# Patient Record
Sex: Female | Born: 1935
Health system: Southern US, Community
[De-identification: ages and names within clinical notes are randomized; demographics above are authoritative.]

## PROBLEM LIST (undated history)

## (undated) DIAGNOSIS — C801 Malignant (primary) neoplasm, unspecified: Secondary | ICD-10-CM

## (undated) DIAGNOSIS — D689 Coagulation defect, unspecified: Secondary | ICD-10-CM

## (undated) DIAGNOSIS — M8580 Other specified disorders of bone density and structure, unspecified site: Secondary | ICD-10-CM

## (undated) DIAGNOSIS — E039 Hypothyroidism, unspecified: Secondary | ICD-10-CM

## (undated) DIAGNOSIS — IMO0002 Reserved for concepts with insufficient information to code with codable children: Secondary | ICD-10-CM

## (undated) DIAGNOSIS — M1990 Unspecified osteoarthritis, unspecified site: Secondary | ICD-10-CM

## (undated) DIAGNOSIS — E669 Obesity, unspecified: Secondary | ICD-10-CM

## (undated) DIAGNOSIS — I10 Essential (primary) hypertension: Secondary | ICD-10-CM

## (undated) DIAGNOSIS — Z7901 Long term (current) use of anticoagulants: Secondary | ICD-10-CM

## (undated) HISTORY — DX: Long term (current) use of anticoagulants: Z79.01

## (undated) HISTORY — DX: Other specified disorders of bone density and structure, unspecified site: M85.80

## (undated) HISTORY — DX: Reserved for concepts with insufficient information to code with codable children: IMO0002

## (undated) HISTORY — PX: CHOLECYSTECTOMY: SHX55

## (undated) HISTORY — DX: Essential (primary) hypertension: I10

## (undated) HISTORY — DX: Obesity, unspecified: E66.9

## (undated) HISTORY — DX: Unspecified osteoarthritis, unspecified site: M19.90

## (undated) HISTORY — DX: Coagulation defect, unspecified: D68.9

## (undated) HISTORY — DX: Hypothyroidism, unspecified: E03.9

## (undated) HISTORY — PX: VESICOVAGINAL FISTULA CLOSURE W/ TAH: SUR271

---

## 1978-08-06 HISTORY — PX: ABDOMINAL HYSTERECTOMY: SHX81

## 2002-02-17 ENCOUNTER — Ambulatory Visit (HOSPITAL_COMMUNITY): Admission: RE | Admit: 2002-02-17 | Discharge: 2002-02-17 | Payer: Self-pay | Admitting: Family Medicine

## 2002-02-17 ENCOUNTER — Encounter: Payer: Self-pay | Admitting: Family Medicine

## 2002-05-06 ENCOUNTER — Ambulatory Visit (HOSPITAL_COMMUNITY): Admission: RE | Admit: 2002-05-06 | Discharge: 2002-05-06 | Payer: Self-pay | Admitting: General Surgery

## 2003-06-24 ENCOUNTER — Ambulatory Visit (HOSPITAL_COMMUNITY): Admission: RE | Admit: 2003-06-24 | Discharge: 2003-06-24 | Payer: Self-pay | Admitting: Family Medicine

## 2003-10-18 ENCOUNTER — Ambulatory Visit (HOSPITAL_COMMUNITY): Admission: RE | Admit: 2003-10-18 | Discharge: 2003-10-18 | Payer: Self-pay | Admitting: Family Medicine

## 2004-10-18 ENCOUNTER — Ambulatory Visit: Payer: Self-pay | Admitting: Family Medicine

## 2005-06-14 ENCOUNTER — Ambulatory Visit: Payer: Self-pay | Admitting: Family Medicine

## 2005-07-16 ENCOUNTER — Ambulatory Visit (HOSPITAL_COMMUNITY): Admission: RE | Admit: 2005-07-16 | Discharge: 2005-07-16 | Payer: Self-pay | Admitting: Family Medicine

## 2005-07-19 ENCOUNTER — Ambulatory Visit: Payer: Self-pay | Admitting: Family Medicine

## 2005-09-24 ENCOUNTER — Ambulatory Visit: Payer: Self-pay | Admitting: Family Medicine

## 2006-02-28 ENCOUNTER — Ambulatory Visit: Payer: Self-pay | Admitting: Family Medicine

## 2006-04-10 ENCOUNTER — Ambulatory Visit: Payer: Self-pay | Admitting: Family Medicine

## 2006-07-10 ENCOUNTER — Ambulatory Visit: Payer: Self-pay | Admitting: Family Medicine

## 2006-07-11 ENCOUNTER — Ambulatory Visit (HOSPITAL_COMMUNITY): Admission: RE | Admit: 2006-07-11 | Discharge: 2006-07-11 | Payer: Self-pay | Admitting: Family Medicine

## 2006-08-22 ENCOUNTER — Ambulatory Visit (HOSPITAL_COMMUNITY): Admission: RE | Admit: 2006-08-22 | Discharge: 2006-08-22 | Payer: Self-pay | Admitting: Family Medicine

## 2006-09-30 ENCOUNTER — Ambulatory Visit: Payer: Self-pay | Admitting: Family Medicine

## 2006-10-07 ENCOUNTER — Ambulatory Visit: Payer: Self-pay | Admitting: Family Medicine

## 2006-12-06 ENCOUNTER — Encounter: Payer: Self-pay | Admitting: Family Medicine

## 2006-12-06 LAB — CONVERTED CEMR LAB
ALT: 12 units/L (ref 0–35)
AST: 15 units/L (ref 0–37)
Albumin: 3.7 g/dL (ref 3.5–5.2)
Alkaline Phosphatase: 47 units/L (ref 39–117)
BUN: 12 mg/dL (ref 6–23)
Bilirubin, Direct: 0.1 mg/dL (ref 0.0–0.3)
CO2: 29 meq/L (ref 19–32)
Calcium: 8.7 mg/dL (ref 8.4–10.5)
Chloride: 106 meq/L (ref 96–112)
Cholesterol: 192 mg/dL (ref 0–200)
Creatinine, Ser: 0.8 mg/dL (ref 0.40–1.20)
Glucose, Bld: 80 mg/dL (ref 70–99)
HDL: 49 mg/dL (ref >39)
Indirect Bilirubin: 0.5 mg/dL (ref 0.0–0.9)
LDL Cholesterol: 119 mg/dL — ABNORMAL HIGH (ref 0–99)
Potassium: 3.9 meq/L (ref 3.5–5.3)
Sodium: 144 meq/L (ref 135–145)
Total Bilirubin: 0.6 mg/dL (ref 0.3–1.2)
Total CHOL/HDL Ratio: 3.9
Total Protein: 6.6 g/dL (ref 6.0–8.3)
Triglycerides: 122 mg/dL (ref <150)
VLDL: 24 mg/dL (ref 0–40)

## 2006-12-09 ENCOUNTER — Other Ambulatory Visit: Admission: RE | Admit: 2006-12-09 | Discharge: 2006-12-09 | Payer: Self-pay | Admitting: Family Medicine

## 2006-12-09 ENCOUNTER — Ambulatory Visit: Payer: Self-pay | Admitting: Family Medicine

## 2006-12-09 ENCOUNTER — Encounter: Payer: Self-pay | Admitting: Family Medicine

## 2006-12-09 LAB — CONVERTED CEMR LAB
Pap Smear: NORMAL
Pap Smear: NORMAL
TSH: 3.462 microintl units/mL (ref 0.350–5.50)

## 2007-06-19 ENCOUNTER — Ambulatory Visit: Payer: Self-pay | Admitting: Family Medicine

## 2007-06-26 ENCOUNTER — Encounter: Payer: Self-pay | Admitting: Family Medicine

## 2007-06-26 LAB — CONVERTED CEMR LAB
ALT: 14 units/L
AST: 18 units/L
Albumin: 4.3 g/dL
Alkaline Phosphatase: 59 units/L
BUN: 13 mg/dL
BUN: 13 mg/dL (ref 6–23)
Basophils Absolute: 0 10*3/uL
Basophils Absolute: 0 10*3/uL (ref 0.0–0.1)
Basophils Relative: 1 %
Basophils Relative: 1 % (ref 0–1)
Bilirubin, Direct: 0.1 mg/dL
CO2: 25 meq/L
CO2: 25 meq/L (ref 19–32)
Calcium: 9.3 mg/dL
Calcium: 9.3 mg/dL (ref 8.4–10.5)
Chloride: 105 meq/L
Chloride: 105 meq/L (ref 96–112)
Cholesterol: 219 mg/dL
Cholesterol: 219 mg/dL — ABNORMAL HIGH (ref 0–200)
Creatinine, Ser: 0.82 mg/dL
Creatinine, Ser: 0.82 mg/dL (ref 0.40–1.20)
Eosinophils Absolute: 0 10*3/uL
Eosinophils Absolute: 0 10*3/uL — ABNORMAL LOW (ref 0.2–0.7)
Eosinophils Relative: 1 %
Eosinophils Relative: 1 % (ref 0–5)
Glucose, Bld: 81 mg/dL
Glucose, Bld: 81 mg/dL (ref 70–99)
HCT: 45 %
HCT: 45 % (ref 36.0–46.0)
HDL: 56 mg/dL
HDL: 56 mg/dL (ref >39)
Hemoglobin: 13.9 g/dL
Hemoglobin: 13.9 g/dL (ref 12.0–15.0)
Indirect Bilirubin: 0.4 mg/dL
LDL Cholesterol: 145 mg/dL
LDL Cholesterol: 145 mg/dL — ABNORMAL HIGH (ref 0–99)
Lymphocytes Relative: 43 %
Lymphocytes Relative: 43 % (ref 12–46)
Lymphs Abs: 1.4 10*3/uL
Lymphs Abs: 1.4 10*3/uL (ref 0.7–4.0)
MCHC: 30.9 g/dL
MCHC: 30.9 g/dL (ref 30.0–36.0)
MCV: 93.4 fL
MCV: 93.4 fL (ref 78.0–100.0)
Monocytes Absolute: 0.3 10*3/uL
Monocytes Absolute: 0.3 10*3/uL (ref 0.1–1.0)
Monocytes Relative: 10 %
Monocytes Relative: 10 % (ref 3–12)
Neutro Abs: 1.5 10*3/uL
Neutro Abs: 1.5 10*3/uL — ABNORMAL LOW (ref 1.7–7.7)
Neutrophils Relative %: 45 %
Neutrophils Relative %: 45 % (ref 43–77)
Platelets: 216 10*3/uL
Platelets: 216 10*3/uL (ref 150–400)
Potassium: 4.4 meq/L
Potassium: 4.4 meq/L (ref 3.5–5.3)
RBC: 4.82 M/uL
RBC: 4.82 M/uL (ref 3.87–5.11)
RDW: 15.3 %
RDW: 15.3 % (ref 11.5–15.5)
Sodium: 143 meq/L
Sodium: 143 meq/L (ref 135–145)
TSH: 4.374 microintl units/mL
TSH: 4.374 microintl units/mL (ref 0.350–5.50)
Total Bilirubin: 0.5 mg/dL
Total CHOL/HDL Ratio: 3.9
Total CHOL/HDL Ratio: 3.9
Total Protein: 7.6 g/dL
Triglycerides: 89 mg/dL
Triglycerides: 89 mg/dL (ref <150)
VLDL: 18 mg/dL
VLDL: 18 mg/dL (ref 0–40)
WBC: 3.3 10*3/uL
WBC: 3.3 10*3/uL — ABNORMAL LOW (ref 4.0–10.5)

## 2007-06-27 ENCOUNTER — Encounter: Payer: Self-pay | Admitting: Family Medicine

## 2007-06-27 LAB — CONVERTED CEMR LAB
ALT: 14 units/L (ref 0–35)
AST: 18 units/L (ref 0–37)
Albumin: 4.3 g/dL (ref 3.5–5.2)
Alkaline Phosphatase: 59 units/L (ref 39–117)
Bilirubin, Direct: 0.1 mg/dL (ref 0.0–0.3)
Indirect Bilirubin: 0.4 mg/dL (ref 0.0–0.9)
Total Bilirubin: 0.5 mg/dL (ref 0.3–1.2)
Total Protein: 7.6 g/dL (ref 6.0–8.3)

## 2007-08-15 ENCOUNTER — Encounter: Payer: Self-pay | Admitting: Family Medicine

## 2007-08-15 DIAGNOSIS — M1990 Unspecified osteoarthritis, unspecified site: Secondary | ICD-10-CM | POA: Insufficient documentation

## 2007-08-15 DIAGNOSIS — E039 Hypothyroidism, unspecified: Secondary | ICD-10-CM | POA: Insufficient documentation

## 2007-08-15 DIAGNOSIS — I10 Essential (primary) hypertension: Secondary | ICD-10-CM

## 2007-08-15 DIAGNOSIS — E669 Obesity, unspecified: Secondary | ICD-10-CM

## 2007-10-28 ENCOUNTER — Ambulatory Visit: Payer: Self-pay | Admitting: Family Medicine

## 2007-10-29 ENCOUNTER — Encounter: Payer: Self-pay | Admitting: Family Medicine

## 2007-10-29 LAB — CONVERTED CEMR LAB
ALT: 13 units/L (ref 0–35)
AST: 19 units/L (ref 0–37)
Albumin: 4.1 g/dL (ref 3.5–5.2)
Alkaline Phosphatase: 51 units/L (ref 39–117)
Bilirubin, Direct: 0.1 mg/dL (ref 0.0–0.3)
Cholesterol: 156 mg/dL (ref 0–200)
HDL: 55 mg/dL (ref >39)
Indirect Bilirubin: 0.5 mg/dL (ref 0.0–0.9)
LDL Cholesterol: 83 mg/dL (ref 0–99)
TSH: 4.785 microintl units/mL (ref 0.350–5.50)
Total Bilirubin: 0.6 mg/dL (ref 0.3–1.2)
Total CHOL/HDL Ratio: 2.8
Total Protein: 7.1 g/dL (ref 6.0–8.3)
Triglycerides: 88 mg/dL (ref <150)
VLDL: 18 mg/dL (ref 0–40)

## 2007-11-04 ENCOUNTER — Ambulatory Visit (HOSPITAL_COMMUNITY): Admission: RE | Admit: 2007-11-04 | Discharge: 2007-11-04 | Payer: Self-pay | Admitting: Family Medicine

## 2007-11-07 ENCOUNTER — Encounter: Payer: Self-pay | Admitting: Family Medicine

## 2007-11-07 DIAGNOSIS — E785 Hyperlipidemia, unspecified: Secondary | ICD-10-CM

## 2007-11-07 DIAGNOSIS — E782 Mixed hyperlipidemia: Secondary | ICD-10-CM | POA: Insufficient documentation

## 2008-01-14 ENCOUNTER — Ambulatory Visit: Payer: Self-pay | Admitting: Family Medicine

## 2008-04-30 ENCOUNTER — Encounter: Payer: Self-pay | Admitting: Family Medicine

## 2008-04-30 LAB — CONVERTED CEMR LAB
ALT: 11 units/L (ref 0–35)
AST: 18 units/L (ref 0–37)
Albumin: 4.1 g/dL (ref 3.5–5.2)
Alkaline Phosphatase: 47 units/L (ref 39–117)
BUN: 13 mg/dL (ref 6–23)
Bilirubin, Direct: 0.2 mg/dL (ref 0.0–0.3)
CO2: 27 meq/L (ref 19–32)
Calcium: 9.4 mg/dL (ref 8.4–10.5)
Chloride: 103 meq/L (ref 96–112)
Cholesterol: 159 mg/dL (ref 0–200)
Creatinine, Ser: 0.85 mg/dL (ref 0.40–1.20)
Glucose, Bld: 91 mg/dL (ref 70–99)
HDL: 57 mg/dL (ref >39)
Indirect Bilirubin: 0.6 mg/dL (ref 0.0–0.9)
LDL Cholesterol: 84 mg/dL (ref 0–99)
Potassium: 4 meq/L (ref 3.5–5.3)
Sodium: 142 meq/L (ref 135–145)
TSH: 2.576 microintl units/mL (ref 0.350–4.50)
Total Bilirubin: 0.8 mg/dL (ref 0.3–1.2)
Total CHOL/HDL Ratio: 2.8
Total Protein: 6.9 g/dL (ref 6.0–8.3)
Triglycerides: 90 mg/dL (ref <150)
VLDL: 18 mg/dL (ref 0–40)

## 2008-05-04 ENCOUNTER — Ambulatory Visit: Payer: Self-pay | Admitting: Family Medicine

## 2008-05-27 ENCOUNTER — Encounter: Payer: Self-pay | Admitting: Family Medicine

## 2008-06-03 ENCOUNTER — Encounter: Payer: Self-pay | Admitting: Family Medicine

## 2008-08-02 ENCOUNTER — Encounter: Payer: Self-pay | Admitting: Family Medicine

## 2008-09-09 ENCOUNTER — Ambulatory Visit: Payer: Self-pay | Admitting: Family Medicine

## 2008-11-05 ENCOUNTER — Ambulatory Visit (HOSPITAL_COMMUNITY): Admission: RE | Admit: 2008-11-05 | Discharge: 2008-11-05 | Payer: Self-pay | Admitting: Family Medicine

## 2008-11-12 ENCOUNTER — Encounter: Payer: Self-pay | Admitting: Family Medicine

## 2008-11-12 LAB — CONVERTED CEMR LAB
ALT: 13 units/L (ref 0–35)
AST: 17 units/L (ref 0–37)
Albumin: 4 g/dL (ref 3.5–5.2)
Alkaline Phosphatase: 52 units/L (ref 39–117)
BUN: 13 mg/dL (ref 6–23)
Basophils Absolute: 0 10*3/uL (ref 0.0–0.1)
Basophils Relative: 1 % (ref 0–1)
Bilirubin, Direct: 0.1 mg/dL (ref 0.0–0.3)
CO2: 25 meq/L (ref 19–32)
Calcium: 9.7 mg/dL (ref 8.4–10.5)
Chloride: 107 meq/L (ref 96–112)
Cholesterol: 163 mg/dL (ref 0–200)
Creatinine, Ser: 0.9 mg/dL (ref 0.40–1.20)
Eosinophils Absolute: 0.1 10*3/uL (ref 0.0–0.7)
Eosinophils Relative: 3 % (ref 0–5)
Glucose, Bld: 99 mg/dL (ref 70–99)
HCT: 41.6 % (ref 36.0–46.0)
HDL: 51 mg/dL (ref >39)
Hemoglobin: 13.1 g/dL (ref 12.0–15.0)
Indirect Bilirubin: 0.6 mg/dL (ref 0.0–0.9)
LDL Cholesterol: 93 mg/dL (ref 0–99)
Lymphocytes Relative: 42 % (ref 12–46)
Lymphs Abs: 1.5 10*3/uL (ref 0.7–4.0)
MCHC: 31.5 g/dL (ref 30.0–36.0)
MCV: 89.5 fL (ref 78.0–100.0)
Monocytes Absolute: 0.4 10*3/uL (ref 0.1–1.0)
Monocytes Relative: 11 % (ref 3–12)
Neutro Abs: 1.5 10*3/uL — ABNORMAL LOW (ref 1.7–7.7)
Neutrophils Relative %: 44 % (ref 43–77)
Platelets: 184 10*3/uL (ref 150–400)
Potassium: 4.2 meq/L (ref 3.5–5.3)
RBC: 4.65 M/uL (ref 3.87–5.11)
RDW: 15 % (ref 11.5–15.5)
Sodium: 143 meq/L (ref 135–145)
TSH: 4.253 microintl units/mL (ref 0.350–4.500)
Total Bilirubin: 0.7 mg/dL (ref 0.3–1.2)
Total CHOL/HDL Ratio: 3.2
Total Protein: 7.1 g/dL (ref 6.0–8.3)
Triglycerides: 97 mg/dL (ref <150)
VLDL: 19 mg/dL (ref 0–40)
WBC: 3.5 10*3/uL — ABNORMAL LOW (ref 4.0–10.5)

## 2008-12-13 ENCOUNTER — Encounter: Payer: Self-pay | Admitting: Family Medicine

## 2008-12-16 ENCOUNTER — Encounter: Payer: Self-pay | Admitting: Family Medicine

## 2008-12-20 ENCOUNTER — Encounter: Payer: Self-pay | Admitting: Family Medicine

## 2009-02-01 ENCOUNTER — Encounter: Payer: Self-pay | Admitting: Family Medicine

## 2009-02-01 ENCOUNTER — Ambulatory Visit: Payer: Self-pay | Admitting: Family Medicine

## 2009-02-01 ENCOUNTER — Other Ambulatory Visit: Admission: RE | Admit: 2009-02-01 | Discharge: 2009-02-01 | Payer: Self-pay | Admitting: Family Medicine

## 2009-02-01 LAB — CONVERTED CEMR LAB: OCCULT 1: NEGATIVE

## 2009-04-20 ENCOUNTER — Ambulatory Visit: Payer: Self-pay | Admitting: Family Medicine

## 2009-05-27 ENCOUNTER — Encounter: Payer: Self-pay | Admitting: Family Medicine

## 2009-06-06 ENCOUNTER — Ambulatory Visit: Payer: Self-pay | Admitting: Family Medicine

## 2009-06-09 ENCOUNTER — Encounter: Payer: Self-pay | Admitting: Family Medicine

## 2009-07-12 LAB — CONVERTED CEMR LAB
ALT: 15 units/L (ref 0–35)
AST: 17 units/L (ref 0–37)
Albumin: 4.1 g/dL (ref 3.5–5.2)
Alkaline Phosphatase: 52 units/L (ref 39–117)
BUN: 13 mg/dL (ref 6–23)
Bilirubin, Direct: 0.2 mg/dL (ref 0.0–0.3)
CO2: 26 meq/L (ref 19–32)
Calcium: 9.5 mg/dL (ref 8.4–10.5)
Chloride: 106 meq/L (ref 96–112)
Cholesterol: 164 mg/dL (ref 0–200)
Creatinine, Ser: 0.84 mg/dL (ref 0.40–1.20)
Glucose, Bld: 81 mg/dL (ref 70–99)
HDL: 62 mg/dL (ref >39)
Indirect Bilirubin: 0.6 mg/dL (ref 0.0–0.9)
LDL Cholesterol: 90 mg/dL (ref 0–99)
Potassium: 4.2 meq/L (ref 3.5–5.3)
Sodium: 142 meq/L (ref 135–145)
TSH: 5.374 microintl units/mL — ABNORMAL HIGH (ref 0.350–4.500)
Total Bilirubin: 0.8 mg/dL (ref 0.3–1.2)
Total CHOL/HDL Ratio: 2.6
Total Protein: 6.9 g/dL (ref 6.0–8.3)
Triglycerides: 59 mg/dL (ref <150)
VLDL: 12 mg/dL (ref 0–40)

## 2009-08-23 ENCOUNTER — Ambulatory Visit: Payer: Self-pay | Admitting: Family Medicine

## 2009-10-03 LAB — CONVERTED CEMR LAB
ALT: 10 units/L (ref 0–35)
AST: 17 units/L (ref 0–37)
Albumin: 4 g/dL (ref 3.5–5.2)
Alkaline Phosphatase: 52 units/L (ref 39–117)
BUN: 13 mg/dL (ref 6–23)
Bilirubin, Direct: 0.1 mg/dL (ref 0.0–0.3)
CO2: 28 meq/L (ref 19–32)
Calcium: 9.3 mg/dL (ref 8.4–10.5)
Chloride: 105 meq/L (ref 96–112)
Cholesterol: 144 mg/dL (ref 0–200)
Creatinine, Ser: 0.83 mg/dL (ref 0.40–1.20)
Glucose, Bld: 91 mg/dL (ref 70–99)
HDL: 51 mg/dL (ref >39)
Indirect Bilirubin: 0.6 mg/dL (ref 0.0–0.9)
LDL Cholesterol: 76 mg/dL (ref 0–99)
Potassium: 4.1 meq/L (ref 3.5–5.3)
Sodium: 141 meq/L (ref 135–145)
TSH: 1.198 microintl units/mL (ref 0.350–4.500)
Total Bilirubin: 0.7 mg/dL (ref 0.3–1.2)
Total CHOL/HDL Ratio: 2.8
Total Protein: 6.9 g/dL (ref 6.0–8.3)
Triglycerides: 86 mg/dL (ref <150)
VLDL: 17 mg/dL (ref 0–40)

## 2009-12-27 ENCOUNTER — Ambulatory Visit: Payer: Self-pay | Admitting: Family Medicine

## 2009-12-27 DIAGNOSIS — R42 Dizziness and giddiness: Secondary | ICD-10-CM | POA: Insufficient documentation

## 2010-01-09 ENCOUNTER — Ambulatory Visit (HOSPITAL_COMMUNITY): Admission: RE | Admit: 2010-01-09 | Discharge: 2010-01-09 | Payer: Self-pay | Admitting: Family Medicine

## 2010-05-17 ENCOUNTER — Ambulatory Visit: Payer: Self-pay | Admitting: Family Medicine

## 2010-05-19 ENCOUNTER — Encounter: Payer: Self-pay | Admitting: Family Medicine

## 2010-05-21 ENCOUNTER — Encounter: Payer: Self-pay | Admitting: Family Medicine

## 2010-05-22 LAB — CONVERTED CEMR LAB
ALT: 16 units/L (ref 0–35)
AST: 19 units/L (ref 0–37)
Albumin: 3.9 g/dL (ref 3.5–5.2)
Alkaline Phosphatase: 52 units/L (ref 39–117)
BUN: 11 mg/dL (ref 6–23)
Basophils Absolute: 0.1 10*3/uL (ref 0.0–0.1)
Basophils Relative: 2 % — ABNORMAL HIGH (ref 0–1)
Bilirubin, Direct: 0.1 mg/dL (ref 0.0–0.3)
CO2: 29 meq/L (ref 19–32)
Calcium: 8.9 mg/dL (ref 8.4–10.5)
Chloride: 105 meq/L (ref 96–112)
Cholesterol: 154 mg/dL (ref 0–200)
Creatinine, Ser: 0.92 mg/dL (ref 0.40–1.20)
Eosinophils Absolute: 0.1 10*3/uL (ref 0.0–0.7)
Eosinophils Relative: 3 % (ref 0–5)
Glucose, Bld: 85 mg/dL (ref 70–99)
HCT: 40.6 % (ref 36.0–46.0)
HDL: 51 mg/dL (ref >39)
Hemoglobin: 13.2 g/dL (ref 12.0–15.0)
Indirect Bilirubin: 0.7 mg/dL (ref 0.0–0.9)
LDL Cholesterol: 87 mg/dL (ref 0–99)
Lymphocytes Relative: 41 % (ref 12–46)
Lymphs Abs: 1.5 10*3/uL (ref 0.7–4.0)
MCHC: 32.5 g/dL (ref 30.0–36.0)
MCV: 89.6 fL (ref 78.0–100.0)
Monocytes Absolute: 0.4 10*3/uL (ref 0.1–1.0)
Monocytes Relative: 10 % (ref 3–12)
Neutro Abs: 1.6 10*3/uL — ABNORMAL LOW (ref 1.7–7.7)
Neutrophils Relative %: 44 % (ref 43–77)
Platelets: 191 10*3/uL (ref 150–400)
Potassium: 3.8 meq/L (ref 3.5–5.3)
RBC: 4.53 M/uL (ref 3.87–5.11)
RDW: 15.1 % (ref 11.5–15.5)
Sodium: 143 meq/L (ref 135–145)
TSH: 7.358 microintl units/mL — ABNORMAL HIGH (ref 0.350–4.500)
Total Bilirubin: 0.8 mg/dL (ref 0.3–1.2)
Total CHOL/HDL Ratio: 3
Total Protein: 6.6 g/dL (ref 6.0–8.3)
Triglycerides: 81 mg/dL (ref <150)
VLDL: 16 mg/dL (ref 0–40)
WBC: 3.7 10*3/uL — ABNORMAL LOW (ref 4.0–10.5)

## 2010-08-06 DIAGNOSIS — C801 Malignant (primary) neoplasm, unspecified: Secondary | ICD-10-CM

## 2010-08-06 HISTORY — DX: Malignant (primary) neoplasm, unspecified: C80.1

## 2010-08-06 HISTORY — PX: BREAST SURGERY: SHX581

## 2010-08-26 ENCOUNTER — Encounter: Payer: Self-pay | Admitting: Family Medicine

## 2010-08-27 ENCOUNTER — Encounter: Payer: Self-pay | Admitting: Family Medicine

## 2010-09-07 NOTE — Letter (Signed)
Summary: REVIEW MED LIST  REVIEW MED LIST   Imported By: Lind Guest 05/19/2010 09:53:36  _____________________________________________________________________  External Attachment:    Type:   Image     Comment:   External Document

## 2010-09-07 NOTE — Assessment & Plan Note (Signed)
Summary: F UP   Vital Signs:  Patient profile:   77 year old female Menstrual status:  postmenopausal Height:      64 inches Weight:      194.50 pounds BMI:     33.51 O2 Sat:      97 % on Room air Pulse rate:   80 / minute Pulse rhythm:   regular Resp:     16 per minute BP sitting:   122 / 82  (right arm)  Vitals Entered By: Johny Drilling  Nutrition Counseling: Patient's BMI is greater than 25 and therefore counseled on weight management options.  O2 Flow:  Room air CC: follow up   Primary Care Provider:  [name] MD  CC:  follow up.  History of Present Illness: Reports  that she has been doing well. Denies recent fever or chills. Denies sinus pressure, nasal congestion , ear pain or sore throat. Denies chest congestion, or cough productive of sputum. Denies chest pain, palpitations, PND, orthopnea or leg swelling. Denies abdominal pain, nausea, vomitting, diarrhea or constipation. Denies change in bowel movements or bloody stool. Denies dysuria , frequency, incontinence or hesitancy. She does have chronic joint pain with reduced mobility, but denies worsening of her symptoms or any falls Denies headaches, vertigo, seizures. Denies depression, anxiety or insomnia. Denies  rash, lesions, or itch.     Allergies (verified): No Known Drug Allergies  Review of Systems      See HPI General:  Complains of fatigue. Eyes:  Denies discharge, eye pain, and red eye. Endo:  Denies cold intolerance, excessive thirst, excessive urination, and heat intolerance. Heme:  Denies abnormal bruising and bleeding. Allergy:  Denies hives or rash and itching eyes.  Physical Exam  General:  alert, well-hydrated, and overweight-appearing.  HEENT: No facial asymmetry,  EOMI, No sinus tenderness, TM's Clear, oropharynx  pink and moist.   Chest: Clear to auscultation bilaterally.  CVS: S1, S2, No murmurs, No S3.   Abd: Soft, Nontender.  MS: decreased ROM spine,adequate in  hips, shoulders and knees.  Ext: No edema.   CNS: CN 2-12 intact, power tone and sensation normal throughout.   Skin: Intactno ulceration or lesions noted Psych: Good eye contact, normal affect.  Memory intact, not anxious or depressed appearing.     Impression & Recommendations:  Problem # 1:  HYPERTENSION (ICD-401.9) Assessment Unchanged  Her updated medication list for this problem includes:    Hydrochlorothiazide 12.5 Mg Caps (Hydrochlorothiazide) .[name]... Take 1 capsule by mouth once a day  BP today: 122/82 Prior BP: 118/74 (12/27/2009)  Labs Reviewed: K+: 4.1 (10/03/2009) Creat: : 0.83 (10/03/2009)   Chol: 144 (10/03/2009)   HDL: 51 (10/03/2009)   LDL: 76 (10/03/2009)   TG: 86 (10/03/2009)  Problem # 2:  OBESITY (ICD-278.00) Assessment: Unchanged  Ht: 64 (05/17/2010)   Wt: 194.50 (05/17/2010)   BMI: 33.51 (05/17/2010) therapeutic lifestyle change discussed and encouraged  Problem # 3:  DEGENERATIVE JOINT DISEASE (ICD-715.90) Assessment: Deteriorated  The following medications were removed from the medication list:    Tylenol Extra Strength 500 Mg Tabs (Acetaminophen) .[name]... As needed Her updated medication list for this problem includes:    Tylenol Extra Strength 500 Mg Tabs (Acetaminophen) ..... One twice daily  Problem # 4:  HYPERLIPIDEMIA (ICD-272.4) Assessment: Comment Only  Her updated medication list for this problem includes:    Lovastatin 20 Mg Tabs (Lovastatin) ..... One tab by mouth at bedtime. Low fat dietdiscussed and encouraged  Labs Reviewed: SGOT: 17 (10/03/2009)  SGPT: 10 (10/03/2009)   HDL:51 (10/03/2009), 62 (06/01/2009)  LDL:76 (10/03/2009), 90 (06/01/2009)  Chol:144 (10/03/2009), 164 (06/01/2009)  Trig:86 (10/03/2009), 59 (06/01/2009)  Problem # 5:  HYPOTHYROIDISM (ICD-244.9) Assessment: Comment Only  Her updated medication list for this problem includes:    Synthroid 75 Mcg Tabs (Levothyroxine sodium) ..... One tab by mouth once  daily.  Orders: T-TSH [id number])  Labs Reviewed: TSH: 1.198 (10/03/2009)    Chol: 144 (10/03/2009)   HDL: 51 (10/03/2009)   LDL: 76 (10/03/2009)   TG: 86 (10/03/2009)  Complete Medication List: 1)  Klor-con 10 10 Meq Tbcr (Potassium chloride) .[name].. 1 by mouth once daily 2)  Oscal 500/200 D-3 500-200 Mg-unit Tabs (Calcium-vitamin d) .[name].. 1 by mouth two times a day 3)  Lovastatin 20 Mg Tabs (Lovastatin) .... One tab by mouth at bedtime. 4)  Hydrochlorothiazide 12.5 Mg Caps (Hydrochlorothiazide) .... Take 1 capsule by mouth once a day 5)  Synthroid 75 Mcg Tabs (Levothyroxine sodium) .... One tab by mouth once daily. 6)  Antivert 12.5 Mg Tabs (Meclizine hcl) .... One tablet three times daily as needed 7)  Tylenol Extra Strength 500 Mg Tabs (Acetaminophen) .... One twice daily  Other Orders: T-Basic Metabolic Panel [id number]) T-Lipid Profile [id number]) T-Hepatic Function [id number]) T-CBC w/Diff [id number]) Influenza Vaccine MCR [id number])  Patient Instructions: 1)  Follow up appointment in 5.2months 2)  I do hope you keep as well as you are doing. 3)  Pls call if any probs. 4)  BMP prior to visit, ICD-9: 5)  Hepatic Panel prior to visit, ICD-9: 6)  Lipid Panel prior to visit, ICD-9:   fasting asap 7)  TSH prior to visit, ICD-9: 8)  CBC w/ Diff prior to visit, ICD-9: 9)  Flu vac today. 10)  The medication list was reviewed and reconciled..All changed/newly prescribed medications were explained. A complete medication list was provided to the patient/caregiver.    Influenza Vaccine    Vaccine Type: Fluvax MCR    Site: right deltoid    Mfr: novartis    Dose: 0.5 ml    Route: IM    Given by: Adella Hare LPN    Exp. Date: 12/2010    Lot #: [id number]    VIS given: 02/28/10 version given May 17, 2010.

## 2010-09-07 NOTE — Assessment & Plan Note (Signed)
Summary: office visit   Vital Signs:  Patient profile:   77 year old female Menstrual status:  postmenopausal Height:      64 inches Weight:      193.25 pounds BMI:     33.29 O2 Sat:      98 % Pulse rate:   87 / minute Pulse rhythm:   regular Resp:     16 per minute BP sitting:   112 / 72 Cuff size:   large  Vitals Entered By: Everitt Amber (August 23, 2009 4:34 PM)  Nutrition Counseling: Patient's BMI is greater than 25 and therefore counseled on weight management options. CC: Follow up chronic problems Is Patient Diabetic? No   Primary Care Provider:  [name] MD  CC:  Follow up chronic problems.  History of Present Illness: Reports  that she has been  doing well. Denies recent fever or chills. Denies sinus pressure, nasal congestion , ear pain or sore throat. Denies chest congestion, or cough productive of sputum. Denies chest pain, palpitations, PND, orthopnea or leg swelling. Denies abdominal pain, nausea, vomitting, diarrhea or constipation. Denies change in bowel movements or bloody stool. Denies dysuria , frequency, incontinence or hesitancy.  Denies headaches, vertigo, seizures. Denies depression, anxiety or insomnia. Denies  rash, lesions, or itch.     Current Medications (verified): 1)  Klor-Con 10 10 Meq  Tbcr (Potassium Chloride) .[name].. 1 By Mouth Once Daily 2)  Tylenol Extra Strength 500 Mg  Tabs (Acetaminophen) .... As Needed 3)  Oscal 500/200 D-3 500-200 Mg-Unit  Tabs (Calcium-Vitamin D) .[name].. 1 By Mouth Two Times A Day 4)  Lovastatin 20 Mg Tabs (Lovastatin) .... One Tab By Mouth Qhs 5)  Prednisone (Pak) 5 Mg Tabs (Prednisone) .... Use As Directed 6)  Hydrochlorothiazide 12.5 Mg Caps (Hydrochlorothiazide) .... Take 1 Capsule By Mouth Once A Day 7)  Synthroid 75 Mcg Tabs (Levothyroxine Sodium) .... One Tab By Mouth Qd  Allergies (verified): No Known Drug Allergies  Review of Systems      See HPI MS:  Complains of joint pain, low back pain,  mid back pain, and stiffness; chronic, not currently flared up, does not desire injections today. Derm:  Complains of itching, lesion(s), and rash; dry puritic rashes at different places on the skin. Heme:  Denies abnormal bruising and bleeding. Allergy:  Denies hives or rash and itching eyes.  Physical Exam  General:  alert, well-hydrated, and overweight-appearing.  HEENT: No facial asymmetry,  EOMI, No sinus tenderness, TM's Clear, oropharynx  pink and moist.   Chest: Clear to auscultation bilaterally.  CVS: S1, S2, No murmurs, No S3.   Abd: Soft, Nontender.  MS: decreased ROM spine,adequate in hips, shoulders and knees.  Ext: No edema.   CNS: CN 2-12 intact, power tone and sensation normal throughout.   Skin: Intact,hypopigmented dry lesions  Psych: Good eye contact, normal affect.  Memory intact, not anxious or depressed appearing.     Impression & Recommendations:  Problem # 1:  HYPERLIPIDEMIA (ICD-272.4) Assessment Comment Only  Her updated medication list for this problem includes:    Lovastatin 20 Mg Tabs (Lovastatin) ..... One tab by mouth qhs  Orders: T-Hepatic Function [id number]) T-Lipid Profile [id number])  Labs Reviewed: SGOT: 17 (06/01/2009)   SGPT: 15 (06/01/2009)   HDL:62 (06/01/2009), 51 (11/12/2008)  LDL:90 (06/01/2009), 93 (11/12/2008)  Chol:164 (06/01/2009), 163 (11/12/2008)  Trig:59 (06/01/2009), 97 (11/12/2008)  Problem # 2:  DEGENERATIVE JOINT DISEASE (ICD-715.90) Assessment: Unchanged  Her updated medication list for this problem  includes:    Tylenol Extra Strength 500 Mg Tabs (Acetaminophen) .[name]... As needed  Problem # 3:  OBESITY (ICD-278.00) Assessment: Deteriorated  Ht: 64 (08/23/2009)   Wt: 193.25 (08/23/2009)   BMI: 33.29 (08/23/2009)  Problem # 4:  HYPOTHYROIDISM (ICD-244.9) Assessment: Comment Only  Her updated medication list for this problem includes:    Synthroid 75 Mcg Tabs (Levothyroxine sodium) ..... One tab by mouth  qd  Orders: T-TSH [id number])  Labs Reviewed: TSH: 5.374 (06/01/2009)    Chol: 164 (06/01/2009)   HDL: 62 (06/01/2009)   LDL: 90 (06/01/2009)   TG: 59 (06/01/2009)  Problem # 5:  HYPERTENSION (ICD-401.9) Assessment: Improved  Her updated medication list for this problem includes:    Hydrochlorothiazide 12.5 Mg Caps (Hydrochlorothiazide) .[name]... Take 1 capsule by mouth once a day  Orders: T-Basic Metabolic Panel [id number])  BP today: 112/72 Prior BP: 120/70 (04/20/2009)  Labs Reviewed: K+: 4.2 (06/01/2009) Creat: : 0.84 (06/01/2009)   Chol: 164 (06/01/2009)   HDL: 62 (06/01/2009)   LDL: 90 (06/01/2009)   TG: 59 (06/01/2009)  Problem # 6:  DEPRESSION (ICD-311)  Problem # 7:  ECZEMA (ICD-692.9) Assessment: Deteriorated  The following medications were removed from the medication list:    Prednisone (pak) 5 Mg Tabs (Prednisone) ..... Use as directed Her updated medication list for this problem includes:    Betamethasone Dipropionate 0.05 % Crea (Betamethasone dipropionate) .[name]... Aplly sparingly to affected areas twice daily as needed  Complete Medication List: 1)  Klor-con 10 10 Meq Tbcr (Potassium chloride) .[name].. 1 by mouth once daily 2)  Tylenol Extra Strength 500 Mg Tabs (Acetaminophen) .... As needed 3)  Oscal 500/200 D-3 500-200 Mg-unit Tabs (Calcium-vitamin d) .[name].. 1 by mouth two times a day 4)  Lovastatin 20 Mg Tabs (Lovastatin) .... One tab by mouth qhs 5)  Hydrochlorothiazide 12.5 Mg Caps (Hydrochlorothiazide) .... Take 1 capsule by mouth once a day 6)  Synthroid 75 Mcg Tabs (Levothyroxine sodium) .... One tab by mouth qd 7)  Betamethasone Dipropionate 0.05 % Crea (Betamethasone dipropionate) .... Aplly sparingly to affected areas twice daily as needed  Patient Instructions: 1)  Please schedule a follow-up appointment in early May 2)  It is important that you exercise regularly at least 20 minutes 5 times a week. If you develop chest pain, have severe difficulty  breathing, or feel very tired , stop exercising immediately and seek medical attention. 3)  You need to lose weight. Consider a lower calorie diet and regular exercise.  4)  I have sent in a cream for your rash, use sparingly as needed, 5)  TSH prior to visit, ICD-9:  first week in Feb 6)  BMP prior to visit, ICD-9: 7)  Hepatic Panel prior to visit, ICD-9:  early May. 8)  YOU look well and continue smiling. 9)  Lipid Panel prior to visit, ICD-9: Prescriptions: BETAMETHASONE DIPROPIONATE 0.05 % CREA (BETAMETHASONE DIPROPIONATE) aplly sparingly to affected areas twice daily as needed  #30 gm x 1   Entered and Authorized by:   [name] MD   Signed by:   [name] MD on 08/23/2009   Method used:   Electronically to        [facility]* (retail)       [address] [place], [place]  [address]       Ph: [phone]       Fax: [phone]   RxID:  [id number]  

## 2010-09-07 NOTE — Assessment & Plan Note (Signed)
Summary: OFFICE VISIT   Vital Signs:  Patient profile:   77 year old female Menstrual status:  postmenopausal Height:      64 inches Weight:      192 pounds BMI:     33.08 O2 Sat:      97 % Pulse rate:   73 / minute Pulse rhythm:   regular Resp:     16 per minute BP sitting:   118 / 74  (left arm) Cuff size:   large  Vitals Entered By: Everitt Amber LPN (Dec 27, 2009 3:47 PM)  Nutrition Counseling: Patient's BMI is greater than 25 and therefore counseled on weight management options. CC: Follow up chronic problems   Primary Care Provider:  [name] MD  CC:  Follow up chronic problems.  History of Present Illness: Reports  that she has been doing well. Denies recent fever or chills. Denies sinus pressure, nasal congestion , ear pain or sore throat. Denies chest congestion, or cough productive of sputum. Denies chest pain, palpitations, PND, orthopnea or leg swelling. Denies abdominal pain, nausea, vomitting, diarrhea or constipation. Denies change in bowel movements or bloody stool. Denies dysuria , frequency, incontinence or hesitancy. reports chronic back pain and knee stiffness, no falls.. Denies headaches, vertigo, seizures. Denies depression, anxiety or insomnia. Denies  rash, lesions, or itch.     Current Medications (verified): 1)  Klor-Con 10 10 Meq  Tbcr (Potassium Chloride) .[name].. 1 By Mouth Once Daily 2)  Tylenol Extra Strength 500 Mg  Tabs (Acetaminophen) .... As Needed 3)  Oscal 500/200 D-3 500-200 Mg-Unit  Tabs (Calcium-Vitamin D) .[name].. 1 By Mouth Two Times A Day 4)  Lovastatin 20 Mg Tabs (Lovastatin) .... One Tab By Mouth Qhs 5)  Hydrochlorothiazide 12.5 Mg Caps (Hydrochlorothiazide) .... Take 1 Capsule By Mouth Once A Day 6)  Synthroid 75 Mcg Tabs (Levothyroxine Sodium) .... One Tab By Mouth Qd 7)  Betamethasone Dipropionate 0.05 % Crea (Betamethasone Dipropionate) .... Aplly Sparingly To Affected Areas Twice Daily As Needed  Allergies  (verified): No Known Drug Allergies  Review of Systems      See HPI Eyes:  Denies blurring and discharge. Neuro:  Complains of sensation of room spinning; 4 to 6 week hx has ahd vertigo in the pasty. Endo:  Denies cold intolerance, excessive hunger, excessive thirst, excessive urination, heat intolerance, polyuria, and weight change. Heme:  Denies abnormal bruising and bleeding. Allergy:  Denies hives or rash and itching eyes.  Physical Exam  General:  alert, well-hydrated, and overweight-appearing.  HEENT: No facial asymmetry,  EOMI, No sinus tenderness, TM's Clear, oropharynx  pink and moist.   Chest: Clear to auscultation bilaterally.  CVS: S1, S2, No murmurs, No S3.   Abd: Soft, Nontender.  MS: decreased ROM spine,adequate in hips, shoulders and knees.  Ext: No edema.   CNS: CN 2-12 intact, power tone and sensation normal throughout.   Skin: Intact,hypopigmented dry lesions  Psych: Good eye contact, normal affect.  Memory intact, not anxious or depressed appearing.     Impression & Recommendations:  Problem # 1:  HYPOTHYROIDISM (ICD-244.9) Assessment Comment Only  Her updated medication list for this problem includes:    Synthroid 75 Mcg Tabs (Levothyroxine sodium) ..... One tab by mouth qd  Orders: T-TSH [id number])  Labs Reviewed: TSH: 1.198 (10/03/2009)    Chol: 144 (10/03/2009)   HDL: 51 (10/03/2009)   LDL: 76 (10/03/2009)   TG: 86 (10/03/2009)  Problem # 2:  HYPERLIPIDEMIA (ICD-272.4) Assessment: Comment Only  Her  updated medication list for this problem includes:    Lovastatin 20 Mg Tabs (Lovastatin) ..... One tab by mouth qhs  Orders: T-Hepatic Function [id number]) T-Lipid Profile [id number])  Labs Reviewed: SGOT: 17 (10/03/2009)   SGPT: 10 (10/03/2009)   HDL:51 (10/03/2009), 62 (06/01/2009)  LDL:76 (10/03/2009), 90 (06/01/2009)  Chol:144 (10/03/2009), 164 (06/01/2009)  Trig:86 (10/03/2009), 59 (06/01/2009)  Problem # 3:  HYPERTENSION  (ICD-401.9) Assessment: Unchanged  Her updated medication list for this problem includes:    Hydrochlorothiazide 12.5 Mg Caps (Hydrochlorothiazide) .[name]... Take 1 capsule by mouth once a day  Orders: T-Basic Metabolic Panel [id number])  BP today: 118/74 Prior BP: 112/72 (08/23/2009)  Labs Reviewed: K+: 4.1 (10/03/2009) Creat: : 0.83 (10/03/2009)   Chol: 144 (10/03/2009)   HDL: 51 (10/03/2009)   LDL: 76 (10/03/2009)   TG: 86 (10/03/2009)  Problem # 4:  DEGENERATIVE JOINT DISEASE (ICD-715.90) Assessment: Unchanged  Her updated medication list for this problem includes:    Tylenol Extra Strength 500 Mg Tabs (Acetaminophen) .[name]... As needed  Problem # 5:  INTERMITTENT VERTIGO (ICD-780.4) Assessment: Comment Only  Her updated medication list for this problem includes:    Antivert 12.5 Mg Tabs (Meclizine hcl) ..... One tablet three times daily as needed  Complete Medication List: 1)  Klor-con 10 10 Meq Tbcr (Potassium chloride) .[name].. 1 by mouth once daily 2)  Tylenol Extra Strength 500 Mg Tabs (Acetaminophen) .... As needed 3)  Oscal 500/200 D-3 500-200 Mg-unit Tabs (Calcium-vitamin d) .[name].. 1 by mouth two times a day 4)  Lovastatin 20 Mg Tabs (Lovastatin) .... One tab by mouth qhs 5)  Hydrochlorothiazide 12.5 Mg Caps (Hydrochlorothiazide) .... Take 1 capsule by mouth once a day 6)  Synthroid 75 Mcg Tabs (Levothyroxine sodium) .... One tab by mouth qd 7)  Betamethasone Dipropionate 0.05 % Crea (Betamethasone dipropionate) .... Aplly sparingly to affected areas twice daily as needed 8)  Antivert 12.5 Mg Tabs (Meclizine hcl) .... One tablet three times daily as needed  Other Orders: Radiology Referral (Radiology) T-Vitamin D (25-Hydroxy) [id number])  Patient Instructions: 1)  f/U 4.5 months. 2)  med sent in for inner ear. 3)  you are being refered for a mammogram. 4)  BMP prior to visit, ICD-9: 5)  Hepatic Panel prior to visit, ICD-9: 6)  Lipid Panel prior to visit, ICD-9:    fasting labs end August 7)  TSH prior to visit, ICD-9: 8)  vit D Prescriptions: ANTIVERT 12.5 MG TABS (MECLIZINE HCL) one tablet three times daily as needed  #30 x 1   Entered and Authorized by:   [name] MD   Signed by:   [name] MD on 12/27/2009   Method used:   Electronically to        [facility] (retail)       [address]       [place], [place]  [address]       Ph: [phone]       Fax: [phone]   RxID:   [id number]

## 2010-09-07 NOTE — Miscellaneous (Signed)
  Clinical Lists Changes  Medications: Added new medication of SYNTHROID 100 MCG TABS (LEVOTHYROXINE SODIUM) Take 1 tablet by mouth once a day - Signed Removed medication of SYNTHROID 75 MCG TABS (LEVOTHYROXINE SODIUM) one tab by mouth once daily. Rx of SYNTHROID 100 MCG TABS (LEVOTHYROXINE SODIUM) Take 1 tablet by mouth once a day;  #30 x 4;  Signed;  Entered by: [name] MD;  Authorized by: [name] MD;  Method used: Printed then faxed to [facility]*, [address] [place], [place], [place]  [address], Ph: [phone], Fax: [phone]    Prescriptions: SYNTHROID 100 MCG TABS (LEVOTHYROXINE SODIUM) Take 1 tablet by mouth once a day  #30 x 4   Entered and Authorized by:   [name] MD   Signed by:   [name] MD on 05/21/2010   Method used:   Printed then faxed to ...       [facility] (retail)       [address]       [place], [place]  [address]       Ph: [phone]       Fax: [phone]   RxID:   [id number]

## 2010-11-08 ENCOUNTER — Encounter: Payer: Self-pay | Admitting: Family Medicine

## 2010-11-13 ENCOUNTER — Ambulatory Visit (INDEPENDENT_AMBULATORY_CARE_PROVIDER_SITE_OTHER): Payer: Medicare HMO | Admitting: Family Medicine

## 2010-11-13 ENCOUNTER — Encounter: Payer: Self-pay | Admitting: Family Medicine

## 2010-11-13 DIAGNOSIS — R5383 Other fatigue: Secondary | ICD-10-CM

## 2010-11-13 DIAGNOSIS — E785 Hyperlipidemia, unspecified: Secondary | ICD-10-CM

## 2010-11-13 DIAGNOSIS — Z23 Encounter for immunization: Secondary | ICD-10-CM

## 2010-11-13 DIAGNOSIS — I10 Essential (primary) hypertension: Secondary | ICD-10-CM

## 2010-11-13 DIAGNOSIS — E039 Hypothyroidism, unspecified: Secondary | ICD-10-CM

## 2010-11-13 DIAGNOSIS — R5381 Other malaise: Secondary | ICD-10-CM

## 2010-11-13 MED ORDER — LEVOTHYROXINE SODIUM 100 MCG PO TABS: 100.0000 ug | ORAL_TABLET | Freq: Every day | ORAL | Status: DC

## 2010-11-13 MED ORDER — POTASSIUM CHLORIDE CRYS ER 10 MEQ PO TBCR: 10.0000 meq | EXTENDED_RELEASE_TABLET | Freq: Two times a day (BID) | ORAL | Status: DC

## 2010-11-13 NOTE — Patient Instructions (Signed)
F/u in  6 months.  TSH and chem 7 today. Mammogram is due in June , we will schedule for you.  You look great , pls continue current medication  CBC ,  Fasting lipid, chem 7 and tsh in 6 months

## 2010-11-14 LAB — BASIC METABOLIC PANEL
BUN: 15 mg/dL (ref 6–23)
CO2: 24 mEq/L (ref 19–32)
Calcium: 9.2 mg/dL (ref 8.4–10.5)
Chloride: 105 mEq/L (ref 96–112)
Creat: 0.86 mg/dL (ref 0.40–1.20)
Glucose, Bld: 93 mg/dL (ref 70–99)
Potassium: 3.7 mEq/L (ref 3.5–5.3)
Sodium: 141 mEq/L (ref 135–145)

## 2010-11-14 LAB — TSH: TSH: 2.401 u[IU]/mL (ref 0.350–4.500)

## 2010-11-16 LAB — LIPID PANEL
Cholesterol: 161 mg/dL (ref 0–200)
HDL: 49 mg/dL (ref >39)
LDL Cholesterol: 81 mg/dL (ref 0–99)
Total CHOL/HDL Ratio: 3.3 Ratio
Triglycerides: 153 mg/dL — ABNORMAL HIGH (ref <150)
VLDL: 31 mg/dL (ref 0–40)

## 2010-11-20 ENCOUNTER — Telehealth: Payer: Self-pay | Admitting: Family Medicine

## 2010-11-20 ENCOUNTER — Other Ambulatory Visit: Payer: Self-pay | Admitting: Family Medicine

## 2010-11-20 DIAGNOSIS — Z139 Encounter for screening, unspecified: Secondary | ICD-10-CM

## 2010-11-20 NOTE — Telephone Encounter (Signed)
Patient is aware of the appointment date and time/slj

## 2010-11-26 ENCOUNTER — Encounter: Payer: Self-pay | Admitting: Family Medicine

## 2010-11-26 NOTE — Progress Notes (Signed)
  Subjective:    Patient ID: Lisa Klein, female    DOB: 02/09/1930, 77 y.o.   MRN: [id number]  HPI The PT is here for follow up and re-evaluation of chronic medical conditions, medication management and review of recent lab and radiology data.  Preventive health is updated, specifically  Cancer screening, Osteoporosis screening and Immunization.   Questions or concerns regarding consultations or procedures which the PT has had in the interim are  addressed. The PT denies any adverse reactions to current medications since the last visit.  There are no new concerns.  There are no specific complaints       Review of Systems Denies recent fever or chills. Denies sinus pressure, nasal congestion, ear pain or sore throat. Denies chest congestion, productive cough or wheezing. Denies chest pains, palpitations, paroxysmal nocturnal dyspnea, orthopnea and leg swelling Denies abdominal pain, nausea, vomiting,diarrhea or constipation.  Denies rectal bleeding or change in bowel movement. Denies dysuria, frequency, hesitancy or incontinence. Denies uncontrolled  joint pain, swelling and limitation in mobility. Denies headaches, seizure, numbness, or tingling. Denies depression, anxiety or insomnia. Denies skin break down or rash.        Objective:   Physical Exam Patient alert and oriented and in no Cardiopulmonary distress.  HEENT: No facial asymmetry, EOMI, no sinus tenderness, TM's clear, Oropharynx pink and moist.  Neck supple no adenopathy.  Chest: Clear to auscultation bilaterally.  CVS: S1, S2 no murmurs, no S3.  ABD: Soft non tender. Bowel sounds normal.  Ext: No edema  MS: Adequate ROM spine, shoulders, hips and reduced in  Knees, which are deformed  Skin: Intact, no ulcerations or rash noted.  Psych: Good eye contact, normal affect. Memory intact not anxious or depressed appearing.  CNS: CN 2-12 intact, power, tone and sensation normal throughout.         Assessment & Plan:  1.Hypertension:Controlled, no changes in medication.  2.Hyperlipidemia: controllde, no med change.Hyperlipidemia:Low fat diet discussed and encouraged. Hypothyroidism: controlled continue meds 3. Obesity: reduced caloric  Intake encouraged to promote weight loss

## 2010-12-22 NOTE — H&P (Signed)
   Lisa Klein, Lisa Klein                        ACCOUNT NO.:  [id number]   MEDICAL RECORD NO.:  [id number]                   PATIENT TYPE:  AMB   LOCATION:  DAY                                  FACILITY:  [facility]   PHYSICIAN:  Jerolyn Shin C. Katrinka Blazing, M.D.                DATE OF BIRTH:  1929-08-11   DATE OF ADMISSION:  DATE OF DISCHARGE:                                HISTORY & PHYSICAL   HISTORY OF PRESENT ILLNESS:  [age]-year-old female with history of guaiac-  positive stool but no evidence of overt rectal bleeding, who is referred for  colonoscopy.  She has no difficulty with bowel movements.  She has had no  abdominal pain.  There is no family history of colon cancer.  She is  scheduled for screening colonoscopy.   PAST MEDICAL HISTORY:  Past history is positive for lumbar disk disease.   MEDICATIONS:  Her only medication is aspirin 81 mg per day.   PAST SURGICAL HISTORY:  Total abdominal hysterectomy with bilateral salpingo-  oophorectomy and cholecystectomy.   ALLERGIES:  She has no known allergies.   PHYSICAL EXAMINATION:  VITAL SIGNS:  Blood pressure is 130/84, pulse 76,  respirations 20.  Weight 192 pounds.  HEENT:  Unremarkable.  NECK:  Neck is supple without JVD or bruit.  CHEST:  Chest clear to auscultation.  No rales, rubs, rhonchi or wheezes.  HEART:  Regular rate and rhythm without murmur, gallop or rub.  ABDOMEN:  Abdomen soft and nontender.  No masses.  RECTAL:  Normal.  Stool guaiac positive.  EXTREMITIES:  No cyanosis, clubbing or edema.  NEUROLOGIC:  No focal motor, sensory or cerebellar deficit.   IMPRESSION:  1. Hemoccult-positive stools.  2. History of lumbar disk disease.   PLAN:  Screening colonoscopy.                                               Dirk Dress. Katrinka Blazing, M.D.    LCS/MEDQ  D:  05/05/2002  T:  05/06/2002  Job:  [id number]

## 2010-12-26 ENCOUNTER — Other Ambulatory Visit: Payer: Self-pay | Admitting: Family Medicine

## 2011-01-12 ENCOUNTER — Ambulatory Visit (HOSPITAL_COMMUNITY)
Admission: RE | Admit: 2011-01-12 | Discharge: 2011-01-12 | Disposition: A | Discharge status: Home / Self Care | Payer: Medicare HMO | Source: Ambulatory Visit | Attending: Family Medicine | Admitting: Family Medicine

## 2011-01-12 DIAGNOSIS — Z139 Encounter for screening, unspecified: Secondary | ICD-10-CM

## 2011-01-12 DIAGNOSIS — Z1231 Encounter for screening mammogram for malignant neoplasm of breast: Secondary | ICD-10-CM | POA: Insufficient documentation

## 2011-01-17 ENCOUNTER — Other Ambulatory Visit: Payer: Self-pay | Admitting: Family Medicine

## 2011-01-17 DIAGNOSIS — R928 Other abnormal and inconclusive findings on diagnostic imaging of breast: Secondary | ICD-10-CM

## 2011-01-18 ENCOUNTER — Encounter: Payer: Self-pay | Admitting: *Deleted

## 2011-01-26 ENCOUNTER — Other Ambulatory Visit: Payer: Self-pay | Admitting: Family Medicine

## 2011-01-26 ENCOUNTER — Ambulatory Visit
Admission: RE | Admit: 2011-01-26 | Discharge: 2011-01-26 | Disposition: A | Discharge status: Home / Self Care | Payer: Medicare Other | Source: Ambulatory Visit | Attending: Family Medicine | Admitting: Family Medicine

## 2011-01-26 ENCOUNTER — Other Ambulatory Visit: Payer: Self-pay | Admitting: Radiology

## 2011-01-26 DIAGNOSIS — R928 Other abnormal and inconclusive findings on diagnostic imaging of breast: Secondary | ICD-10-CM

## 2011-01-26 DIAGNOSIS — N6320 Unspecified lump in the left breast, unspecified quadrant: Secondary | ICD-10-CM

## 2011-01-27 ENCOUNTER — Other Ambulatory Visit: Payer: Self-pay | Admitting: Family Medicine

## 2011-01-29 ENCOUNTER — Other Ambulatory Visit: Payer: Self-pay | Admitting: Family Medicine

## 2011-01-29 ENCOUNTER — Ambulatory Visit
Admission: RE | Admit: 2011-01-29 | Discharge: 2011-01-29 | Disposition: A | Discharge status: Home / Self Care | Payer: Medicare Other | Source: Ambulatory Visit | Attending: Family Medicine | Admitting: Family Medicine

## 2011-01-29 DIAGNOSIS — R928 Other abnormal and inconclusive findings on diagnostic imaging of breast: Secondary | ICD-10-CM

## 2011-01-29 DIAGNOSIS — C50912 Malignant neoplasm of unspecified site of left female breast: Secondary | ICD-10-CM

## 2011-02-02 ENCOUNTER — Ambulatory Visit
Admission: RE | Admit: 2011-02-02 | Discharge: 2011-02-02 | Disposition: A | Discharge status: Home / Self Care | Payer: Medicare HMO | Source: Ambulatory Visit | Attending: Family Medicine | Admitting: Family Medicine

## 2011-02-02 DIAGNOSIS — C50912 Malignant neoplasm of unspecified site of left female breast: Secondary | ICD-10-CM

## 2011-02-02 MED ORDER — GADOBENATE DIMEGLUMINE 529 MG/ML IV SOLN
18.0000 mL | Freq: Once | INTRAVENOUS | Status: AC | PRN
  Administered 2011-02-02: 18 mL via INTRAVENOUS

## 2011-02-05 ENCOUNTER — Telehealth: Payer: Self-pay | Admitting: Family Medicine

## 2011-02-05 NOTE — Telephone Encounter (Signed)
error 

## 2011-02-07 ENCOUNTER — Other Ambulatory Visit: Payer: Self-pay | Admitting: General Surgery

## 2011-02-07 NOTE — H&P (Signed)
Lisa Klein is an 77 y.o. female.   Chief Complaint: *Left breast cancer** HPI: *Left breast masses found on routine examination.  Biopsy proven in two areas of the left breast and lymph node in axilla to be invasive carcinoma.  Patient is otherwise asymptomatic.  Past Medical History  Diagnosis Date  . DJD (degenerative joint disease)   . Depression   . Hypertension   . Hypothyroidism   . Obesity   . Degenerative disc disease     with nerve compression   . Hypothyroidism   . Hypertension   . Depression     Past Surgical History  Procedure Date  . Cholecystectomy   . Vesicovaginal fistula closure w/ tah     Family History  Problem Relation Age of Onset  . COPD Father   . Lung disease Father   . Hypertension Sister    Social History:  reports that she has never smoked. She does not have any smokeless tobacco history on file. She reports that she does not drink alcohol or use illicit drugs.  Allergies: No Known Allergies  No prescriptions prior to admission    No results found for this or any previous visit (from the past 48 hour(s)). No results found.  Review of Systems  Constitutional: Negative.   HENT: Negative.   Eyes: Negative.   Respiratory: Negative.   Cardiovascular: Negative.   Genitourinary: Negative.   Musculoskeletal: Negative.   Skin: Negative.   Neurological: Negative.   Endo/Heme/Allergies: Negative.   Psychiatric/Behavioral: Negative.     There were no vitals taken for this visit. Physical Exam  Constitutional: She appears well-developed and well-nourished.  HENT:  Head: Normocephalic.  Eyes: Pupils are equal, round, and reactive to light.  Neck: Neck supple.  Cardiovascular: Normal rate, regular rhythm and normal heart sounds.   Respiratory: Effort normal and breath sounds normal.  GI: Soft. She exhibits no distension. There is no tenderness.  Genitourinary:       Two dominant masses in the lateral aspect of the left breast, with  axillary lymphadenopathy.  No nipple discharge, dimpling.  Right breast exam unremarkable.  Pendulous breasts bilaterally.  Lymphadenopathy:    She has no cervical adenopathy.     Assessment/Plan *Left breast carcinoma/Scheduled for left modified radical mastectomy on 02/14/11.  Risks and benefits of procedure including bleeding, infection, and nerve injury were fully explained to the patient, who gives informed consent.  Lisa Klein A 02/07/2011, 1:24 PM

## 2011-02-09 ENCOUNTER — Encounter (HOSPITAL_COMMUNITY)
Admission: RE | Admit: 2011-02-09 | Discharge: 2011-02-09 | Disposition: A | Discharge status: Home / Self Care | Payer: Medicare HMO | Source: Ambulatory Visit | Attending: General Surgery | Admitting: General Surgery

## 2011-02-09 ENCOUNTER — Other Ambulatory Visit: Payer: Self-pay

## 2011-02-09 ENCOUNTER — Encounter (HOSPITAL_COMMUNITY): Payer: Self-pay

## 2011-02-09 HISTORY — DX: Malignant (primary) neoplasm, unspecified: C80.1

## 2011-02-09 LAB — COMPREHENSIVE METABOLIC PANEL
AST: 21 U/L (ref 0–37)
Albumin: 3.6 g/dL (ref 3.5–5.2)
BUN: 13 mg/dL (ref 6–23)
CO2: 29 mEq/L (ref 19–32)
Calcium: 9.5 mg/dL (ref 8.4–10.5)
Creatinine, Ser: 0.89 mg/dL (ref 0.50–1.10)
GFR calc non Af Amer: >60 mL/min (ref >60)

## 2011-02-09 LAB — DIFFERENTIAL
Basophils Absolute: 0.1 10*3/uL (ref 0.0–0.1)
Lymphocytes Relative: 41 % (ref 12–46)
Monocytes Absolute: 0.4 10*3/uL (ref 0.1–1.0)
Neutro Abs: 1.6 10*3/uL — ABNORMAL LOW (ref 1.7–7.7)
Neutrophils Relative %: 44 % (ref 43–77)

## 2011-02-09 LAB — CBC
HCT: 40.8 % (ref 36.0–46.0)
MCH: 29.4 pg (ref 26.0–34.0)
MCV: 89.5 fL (ref 78.0–100.0)
Platelets: 186 10*3/uL (ref 150–400)
RDW: 14.5 % (ref 11.5–15.5)

## 2011-02-09 NOTE — Patient Instructions (Signed)
20 Lisa Klein  02/09/2011   Your procedure is scheduled on:  Wednesday, 02/14/11  Report to [facility] at 1140 AM.  Call this number if you have problems the morning of surgery: [phone]   Remember:   Do not eat food:After Midnight.  Do not drink clear liquids: After Midnight.  Take these medicines the morning of surgery with A SIP OF WATER: SYNTHROID   Do not wear jewelry, make-up or nail polish.  Do not bring valuables to the hospital.  Contacts, dentures or bridgework may not be worn into surgery.  Leave suitcase in the car. After surgery it may be brought to your room.  For patients admitted to the hospital, checkout time is 11:00 AM the day of discharge.   Patients discharged the day of surgery will not be allowed to drive home.  Name and phone number of your driver: KELLY  Special Instructions: CHG Shower Shower 2 days before surgery and 1 day before surgery with Hibiclens.   Please read over the following fact sheets that you were given: Pain Booklet, Coughing and Deep Breathing, MRSA Information, Surgical Site Infection Prevention and Anesthesia Post-op Instructions     Mastectomy, After Care HOME CARE  Your doctor or nurse will show you how to care for your wound after the bandage is off.   If you wear a bra, put soft padding such as gauze, soft cloth or a nursing pad over your wound.   You may feel depressed or sad about the loss of your breast. This is normal. Ask your doctor about groups that can help you.   The arm on the side that your breast was removed may be slightly swollen. The arm may be hard to move for a while because of soreness and weakness. You may not have much or any feeling (numbness) in that arm but it will slowly feel better. You may feel tingling.   Remember to exercise your arm and shoulder as you were told. One way to exercise your shoulder is to place your hands on a wall. Use your fingers to "climb the wall." Reach as high as you can until  you feel a stretch. When you are not exercising, keep your arm up at the same level as or above your heart.   When sitting or lying down, put your arm up on pillows or rolled blankets.   You will always need to take good care of the arm on the side that the breast was removed.   Never let anyone take your blood pressure, draw blood or give you a shot in that arm.   Be careful not to get even a small cut on that arm or hand. Use a thimble when you sew. Wear heavy gloves when you garden.   Use insect repellent on that arm if you are outside and around insects that bite.   Do not use a razor to shave that underarm. You should use only an electric shaver.   Be careful not to burn that arm. Use a glove when you reach into the oven. Cover your arm with a towel or wear a long-sleeved shirt when you are out in the sun.   Wear your watch and other jewelry on the other arm.   Wear a loose fitting rubber glove when you wash the dishes. Do not leave your hand in water for a long time, especially when you use detergents.   Do not cut your cuticles or hang nails. Push cuticles  back with a towel after you take a bath.   Carry your purse or any heavy objects in the other arm. For the first 6 weeks, do not use your arm to lift or push anything heavier than 10 pounds (about one gallon of milk ).  GET HELP RIGHT AWAY IF:  Your arm becomes very puffy (swollen).   You see any signs of infection in your wound such as:   Redness or pain at the wound.   Bad smelling green or yellow drainage from your wound.   Fever over 101 F (38 C).  Document Released: 05/01/2008 Document Re-Released: 10/19/2008 [facility] Patient Information 2011 [place], [place].

## 2011-02-13 MED ORDER — CEFAZOLIN SODIUM-DEXTROSE 2-3 GM-% IV SOLR
2.0000 g | INTRAVENOUS | Status: DC
  Filled 2011-02-13: qty 50

## 2011-02-14 ENCOUNTER — Inpatient Hospital Stay (HOSPITAL_COMMUNITY)
Admission: RE | Admit: 2011-02-14 | Discharge: 2011-02-16 | DRG: 583 | Disposition: A | Discharge status: Home Health | Payer: Medicare HMO | Source: Ambulatory Visit | Attending: General Surgery | Admitting: General Surgery

## 2011-02-14 ENCOUNTER — Encounter (HOSPITAL_COMMUNITY): Payer: Self-pay | Admitting: Anesthesiology

## 2011-02-14 ENCOUNTER — Ambulatory Visit (HOSPITAL_COMMUNITY): Payer: Medicare HMO | Admitting: Anesthesiology

## 2011-02-14 ENCOUNTER — Other Ambulatory Visit: Payer: Self-pay | Admitting: General Surgery

## 2011-02-14 ENCOUNTER — Encounter (HOSPITAL_COMMUNITY)
Admission: RE | Disposition: A | Discharge status: Home Health | Payer: Self-pay | Source: Ambulatory Visit | Attending: General Surgery

## 2011-02-14 ENCOUNTER — Encounter (HOSPITAL_COMMUNITY): Payer: Self-pay | Admitting: *Deleted

## 2011-02-14 DIAGNOSIS — M1990 Unspecified osteoarthritis, unspecified site: Secondary | ICD-10-CM

## 2011-02-14 DIAGNOSIS — R42 Dizziness and giddiness: Secondary | ICD-10-CM

## 2011-02-14 DIAGNOSIS — E669 Obesity, unspecified: Secondary | ICD-10-CM

## 2011-02-14 DIAGNOSIS — E785 Hyperlipidemia, unspecified: Secondary | ICD-10-CM

## 2011-02-14 DIAGNOSIS — E039 Hypothyroidism, unspecified: Secondary | ICD-10-CM

## 2011-02-14 DIAGNOSIS — I10 Essential (primary) hypertension: Secondary | ICD-10-CM

## 2011-02-14 DIAGNOSIS — C50919 Malignant neoplasm of unspecified site of unspecified female breast: Principal | ICD-10-CM | POA: Diagnosis present

## 2011-02-14 HISTORY — PX: MASTECTOMY MODIFIED RADICAL: SUR848

## 2011-02-14 SURGERY — MASTECTOMY, MODIFIED RADICAL
Anesthesia: General | Site: Breast | Laterality: Left | Wound class: Clean

## 2011-02-14 MED ORDER — LIDOCAINE HCL (PF) 1 % IJ SOLN
INTRAMUSCULAR | Status: AC
  Filled 2011-02-14: qty 5

## 2011-02-14 MED ORDER — SUFENTANIL CITRATE 50 MCG/ML IV SOLN
INTRAVENOUS | Status: DC | PRN
  Administered 2011-02-14: 20 ug via INTRAVENOUS
  Administered 2011-02-14: 5 ug via INTRAVENOUS

## 2011-02-14 MED ORDER — MIDAZOLAM HCL 2 MG/2ML IJ SOLN
1.0000 mg | INTRAMUSCULAR | Status: DC | PRN
  Administered 2011-02-14: 2 mg via INTRAVENOUS

## 2011-02-14 MED ORDER — ENOXAPARIN SODIUM 40 MG/0.4ML ~~LOC~~ SOLN
40.0000 mg | Freq: Once | SUBCUTANEOUS | Status: AC
  Administered 2011-02-14: 40 mg via SUBCUTANEOUS

## 2011-02-14 MED ORDER — POVIDONE-IODINE 10 % EX OINT
TOPICAL_OINTMENT | CUTANEOUS | Status: AC
  Filled 2011-02-14: qty 1

## 2011-02-14 MED ORDER — MIDAZOLAM HCL 2 MG/2ML IJ SOLN
INTRAMUSCULAR | Status: AC
  Administered 2011-02-14: 2 mg via INTRAVENOUS
  Filled 2011-02-14: qty 2

## 2011-02-14 MED ORDER — PROPOFOL 10 MG/ML IV EMUL
INTRAVENOUS | Status: AC
  Filled 2011-02-14: qty 20

## 2011-02-14 MED ORDER — MECLIZINE HCL 12.5 MG PO TABS: 12.5000 mg | ORAL_TABLET | Freq: Three times a day (TID) | ORAL | Status: DC | PRN

## 2011-02-14 MED ORDER — ENOXAPARIN SODIUM 40 MG/0.4ML ~~LOC~~ SOLN
40.0000 mg | SUBCUTANEOUS | Status: DC
Start: 2011-02-14 — End: 2011-02-16
  Administered 2011-02-15 – 2011-02-16 (×2): 40 mg via SUBCUTANEOUS
  Filled 2011-02-14 (×2): qty 0.4

## 2011-02-14 MED ORDER — CEFAZOLIN SODIUM 1-5 GM-% IV SOLN
INTRAVENOUS | Status: DC | PRN
  Administered 2011-02-14: 1 g via INTRAVENOUS

## 2011-02-14 MED ORDER — MORPHINE SULFATE 2 MG/ML IJ SOLN: 1.0000 mg | INTRAMUSCULAR | Status: DC | PRN

## 2011-02-14 MED ORDER — SODIUM CHLORIDE 0.9 % IJ SOLN
INTRAMUSCULAR | Status: AC
  Administered 2011-02-14: 10 mL via INTRAVENOUS
  Filled 2011-02-14: qty 10

## 2011-02-14 MED ORDER — ENOXAPARIN SODIUM 40 MG/0.4ML ~~LOC~~ SOLN
SUBCUTANEOUS | Status: AC
  Filled 2011-02-14: qty 0.4

## 2011-02-14 MED ORDER — SUCCINYLCHOLINE CHLORIDE 20 MG/ML IJ SOLN
INTRAMUSCULAR | Status: DC | PRN
  Administered 2011-02-14: 100 mg via INTRAVENOUS

## 2011-02-14 MED ORDER — SIMVASTATIN 10 MG PO TABS
5.0000 mg | ORAL_TABLET | Freq: Every day | ORAL | Status: DC
  Administered 2011-02-14 – 2011-02-15 (×2): 5 mg via ORAL
  Filled 2011-02-14 (×2): qty 1

## 2011-02-14 MED ORDER — ONDANSETRON HCL 4 MG/2ML IJ SOLN: 4.0000 mg | Freq: Once | INTRAMUSCULAR | Status: DC | PRN

## 2011-02-14 MED ORDER — LEVOTHYROXINE SODIUM 100 MCG PO TABS
100.0000 ug | ORAL_TABLET | Freq: Every day | ORAL | Status: DC
  Administered 2011-02-15 – 2011-02-16 (×2): 100 ug via ORAL
  Filled 2011-02-14 (×2): qty 1

## 2011-02-14 MED ORDER — ACETAMINOPHEN 10 MG/ML IV SOLN
1000.0000 mg | Freq: Four times a day (QID) | INTRAVENOUS | Status: AC
  Administered 2011-02-14 – 2011-02-15 (×4): 1000 mg via INTRAVENOUS
  Filled 2011-02-14 (×4): qty 100

## 2011-02-14 MED ORDER — LIDOCAINE HCL 1 % IJ SOLN
INTRAMUSCULAR | Status: DC | PRN
  Administered 2011-02-14: 50 mg via INTRADERMAL

## 2011-02-14 MED ORDER — POVIDONE-IODINE 10 % OINT PACKET
TOPICAL_OINTMENT | CUTANEOUS | Status: DC | PRN
  Administered 2011-02-14: 1 via TOPICAL

## 2011-02-14 MED ORDER — ONDANSETRON HCL 4 MG/2ML IJ SOLN
INTRAMUSCULAR | Status: AC
  Filled 2011-02-14: qty 2

## 2011-02-14 MED ORDER — HYDROCHLOROTHIAZIDE 25 MG PO TABS
12.5000 mg | ORAL_TABLET | Freq: Every day | ORAL | Status: DC
  Administered 2011-02-15 – 2011-02-16 (×2): 12.5 mg via ORAL
  Filled 2011-02-14 (×5): qty 1

## 2011-02-14 MED ORDER — POTASSIUM CHLORIDE CRYS ER 10 MEQ PO TBCR
10.0000 meq | EXTENDED_RELEASE_TABLET | Freq: Two times a day (BID) | ORAL | Status: DC
  Administered 2011-02-14 – 2011-02-16 (×5): 10 meq via ORAL
  Filled 2011-02-14 (×5): qty 1

## 2011-02-14 MED ORDER — PANTOPRAZOLE SODIUM 40 MG IV SOLR
40.0000 mg | INTRAVENOUS | Status: DC
  Administered 2011-02-14: 40 mg via INTRAVENOUS
  Filled 2011-02-14: qty 40

## 2011-02-14 MED ORDER — LACTATED RINGERS IV SOLN
INTRAVENOUS | Status: DC | PRN
  Administered 2011-02-14 (×2): via INTRAVENOUS

## 2011-02-14 MED ORDER — SODIUM CHLORIDE 0.9 % IJ SOLN
INTRAMUSCULAR | Status: AC
  Administered 2011-02-14: 23:00:00
  Filled 2011-02-14: qty 3

## 2011-02-14 MED ORDER — PROPOFOL 10 MG/ML IV EMUL
INTRAVENOUS | Status: DC | PRN
  Administered 2011-02-14: 130 mg via INTRAVENOUS

## 2011-02-14 MED ORDER — LACTATED RINGERS IV SOLN
INTRAVENOUS | Status: DC
Start: 2011-02-14 — End: 2011-02-16
  Administered 2011-02-14 – 2011-02-15 (×3): via INTRAVENOUS

## 2011-02-14 MED ORDER — SUCCINYLCHOLINE CHLORIDE 20 MG/ML IJ SOLN
INTRAMUSCULAR | Status: AC
  Filled 2011-02-14: qty 1

## 2011-02-14 MED ORDER — ONDANSETRON HCL 4 MG/2ML IJ SOLN: 4.0000 mg | Freq: Four times a day (QID) | INTRAMUSCULAR | Status: DC | PRN

## 2011-02-14 MED ORDER — LACTATED RINGERS IV SOLN
INTRAVENOUS | Status: DC
  Administered 2011-02-14: 08:00:00 via INTRAVENOUS

## 2011-02-14 MED ORDER — SODIUM CHLORIDE 0.9 % IR SOLN
Status: DC | PRN
  Administered 2011-02-14: 1000 mL

## 2011-02-14 MED ORDER — FENTANYL CITRATE 0.05 MG/ML IJ SOLN: 25.0000 ug | INTRAMUSCULAR | Status: DC | PRN

## 2011-02-14 MED ORDER — BUPIVACAINE HCL (PF) 0.5 % IJ SOLN
INTRAMUSCULAR | Status: AC
  Filled 2011-02-14: qty 30

## 2011-02-14 MED ORDER — SUFENTANIL CITRATE 50 MCG/ML IV SOLN
INTRAVENOUS | Status: AC
  Filled 2011-02-14: qty 1

## 2011-02-14 MED ORDER — ACETAMINOPHEN 10 MG/ML IV SOLN
INTRAVENOUS | Status: AC
  Filled 2011-02-14: qty 100

## 2011-02-14 MED ORDER — ONDANSETRON HCL 4 MG/2ML IJ SOLN
INTRAMUSCULAR | Status: DC | PRN
  Administered 2011-02-14: 4 mg via INTRAVENOUS

## 2011-02-14 SURGICAL SUPPLY — 46 items
APPLIER CLIP 11 MED OPEN (CLIP) ×2
APPLIER CLIP 9.375 SM OPEN (CLIP)
BAG HAMPER (MISCELLANEOUS) ×2 IMPLANT
BNDG CONFORM 6X.82 1P STRL (GAUZE/BANDAGES/DRESSINGS) ×2 IMPLANT
CLIP APPLIE 11 MED OPEN (CLIP) ×1 IMPLANT
CLIP APPLIE 9.375 SM OPEN (CLIP) IMPLANT
CLOTH BEACON ORANGE TIMEOUT ST (SAFETY) ×2 IMPLANT
COVER LIGHT HANDLE STERIS (MISCELLANEOUS) ×4 IMPLANT
DRAPE PROXIMA HALF (DRAPES) ×6 IMPLANT
DRAPE UTILITY W/TAPE 26X15 (DRAPES) ×2 IMPLANT
DURAPREP 26ML APPLICATOR (WOUND CARE) ×2 IMPLANT
ELECT REM PT RETURN 9FT ADLT (ELECTROSURGICAL) ×2
ELECTRODE REM PT RTRN 9FT ADLT (ELECTROSURGICAL) ×1 IMPLANT
EVACUATOR DRAINAGE 10X20 100CC (DRAIN) ×2 IMPLANT
EVACUATOR SILICONE 100CC (DRAIN) ×2
GLOVE BIO SURGEON STRL SZ7.5 (GLOVE) ×2 IMPLANT
GLOVE ECLIPSE 7.0 STRL STRAW (GLOVE) ×2 IMPLANT
GLOVE ECLIPSE 8.0 STRL XLNG CF (GLOVE) ×2 IMPLANT
GLOVE INDICATOR 7.0 STRL GRN (GLOVE) ×6 IMPLANT
GLOVE INDICATOR 8.5 STRL (GLOVE) ×2 IMPLANT
GLOVE OPTIFIT SS 6.5 STRL BRWN (GLOVE) ×2 IMPLANT
GOWN BRE IMP SLV AUR XL STRL (GOWN DISPOSABLE) ×6 IMPLANT
GOWN STRL REIN 3XL LVL4 (GOWN DISPOSABLE) ×2 IMPLANT
KIT ROOM TURNOVER APOR (KITS) ×2 IMPLANT
MANIFOLD NEPTUNE II (INSTRUMENTS) ×2 IMPLANT
NS IRRIG 1000ML POUR BTL (IV SOLUTION) ×2 IMPLANT
PACK MINOR (CUSTOM PROCEDURE TRAY) ×2 IMPLANT
PAD ARMBOARD 7.5X6 YLW CONV (MISCELLANEOUS) ×2 IMPLANT
PENCIL HANDSWITCHING (ELECTRODE) ×2 IMPLANT
SET BASIN LINEN APH (SET/KITS/TRAYS/PACK) ×2 IMPLANT
SPONGE DRAIN TRACH 4X4 STRL 2S (GAUZE/BANDAGES/DRESSINGS) ×2 IMPLANT
SPONGE GAUZE 4X4 12PLY (GAUZE/BANDAGES/DRESSINGS) ×2 IMPLANT
SPONGE INTESTINAL PEANUT (DISPOSABLE) ×2 IMPLANT
SPONGE LAP 18X18 X RAY DECT (DISPOSABLE) ×6 IMPLANT
STAPLER VISISTAT (STAPLE) ×2 IMPLANT
STOCKINETTE IMPERVIOUS LG (DRAPES) IMPLANT
SUT ETHILON 3 0 FSL (SUTURE) ×2 IMPLANT
SUT SILK 2 0 (SUTURE) ×1
SUT SILK 2 0 SH (SUTURE) ×2 IMPLANT
SUT SILK 2-0 18XBRD TIE 12 (SUTURE) ×1 IMPLANT
SUT VIC AB 2-0 CT1 27 (SUTURE) ×7
SUT VIC AB 2-0 CT1 TAPERPNT 27 (SUTURE) ×7 IMPLANT
SUT VIC AB 3-0 SH 27 (SUTURE) ×1
SUT VIC AB 3-0 SH 27X BRD (SUTURE) ×1 IMPLANT
SUT VICRYL AB 2 0 TIES (SUTURE) IMPLANT
SYR BULB IRRIGATION 50ML (SYRINGE) ×2 IMPLANT

## 2011-02-14 NOTE — Interval H&P Note (Signed)
No acute changes on PE.  OK for surgery.

## 2011-02-14 NOTE — Transfer of Care (Signed)
Immediate Anesthesia Transfer of Care Note  Patient: Lisa Klein  Procedure(s) Performed:  MASTECTOMY MODIFIED RADICAL  Patient Location: PACU  Anesthesia Type: General  Level of Consciousness: sedated and responds to stimulation  Airway & Oxygen Therapy: Patient Spontanous Breathing and non-rebreather face mask  Post-op Assessment: Report given to PACU RN, Post -op Vital signs reviewed and stable and Patient moving all extremities  Post vital signs: stable  Complications: No apparent anesthesia complications

## 2011-02-14 NOTE — Anesthesia Postprocedure Evaluation (Signed)
  Anesthesia Post-op Note  Patient: Lisa Klein  Procedure(s) Performed:  MASTECTOMY MODIFIED RADICAL  Patient Location: PACU  Anesthesia Type: General  Level of Consciousness: sedated  Airway and Oxygen Therapy: face mask  Post-op Pain: none  Post-op Assessment: Patient's Cardiovascular Status Stable, Respiratory Function Stable and Patent Airway  Post-op Vital Signs: stable  Complications: No apparent anesthesia complications

## 2011-02-14 NOTE — Op Note (Deleted)
Preop diagnosis: Left breast carcinoma  Postoperative diagnosis: Same  Procedure: Left modified radical mastectomy  Surgeon Dr. Franky Macho  Anesthesia: Gen. endotracheal  Indications: Patient is a 77 year old black female who presents with biopsy proven invasive mammary carcinoma the left breast. Due to the extensiveness of the cancer, the patient now comes to the operating room for left modified radical mastectomy. The risks and benefits of the procedure including bleeding, infection, difficulties, nerve injury, and possible blood transfusion were fully explained to the patient, informed consent.  Procedure note: The patient was placed in the supine position. After induction of general endotracheal anesthesia, the left breast and axilla were prepped and draped using the usual sterile technique the DuraPrep. Surgical site confirmation was performed.  An elliptical incision was made medial to the nipple. A superior flap was formed to the clavicle and an inferior flap to the chest wall.The breast was then removed medial to lateral from the pectoralis major muscle using electrocautery. A short suture was placed superiorly and a long suture placed laterally for orientation purposes. Next, a level II axillary dissection was performed. Care was taken to avoid the femoral neck the dorsal artery, vein, and nerve as well as the long thoracic nerve. Several palpable lymph nodes were noted within the specimen. Bleeding was controlled using clips. The left breast and axillary contents were then removed from the operative field and sent to pathology further examination. The wound is copiously irrigated with normal saline. A #10 flat Jackson-Pratt drain was placed into the left axilla and brought through separate stab wound inferior to the incision line. Just lateral to this exit site, an additional #10 flat Jackson-Pratt drain was placed underneath the skin flap. Both were secured to skin using 3-0 nylon the  subcutaneous was reapproximated using 2-0 Vicryl sutures. The skin was closed using staples. Betadine ointment after dressings were applied.  A taper needle counts were correct at the end of the procedure. The patient was extubated in the operating room back to the recovery room awake in stable condition.  Complications: None  Specimen: Left breast and axillary contents  Blood loss: None 100 cc  Drains: Drains x2 to left axilla and left breast flap

## 2011-02-14 NOTE — H&P (Signed)
Lisa Klein is an 77 y.o. female.   Chief Complaint: *Left breast cancer** HPI: *Left breast masses found on routine examination.  Biopsy proven in two areas of the left breast and lymph node in axilla to be invasive carcinoma.  Patient is otherwise asymptomatic.  Past Medical History  Diagnosis Date   DJD (degenerative joint disease)    Hypertension    Hypothyroidism    Obesity    Degenerative disc disease     with nerve compression    Hypothyroidism    Hypertension    Depression    Cancer 2012    LEFT BREAST    Past Surgical History  Procedure Date   Vesicovaginal fistula closure w/ tah     [facility]   Cholecystectomy 80'S    [facility]    Family History  Problem Relation Age of Onset   COPD Father    Lung disease Father    Hypertension Sister    Anesthesia problems Neg Hx    Malignant hyperthermia Neg Hx    Hypotension Neg Hx    Pseudochol deficiency Neg Hx    Social History:  reports that she has never smoked. She does not have any smokeless tobacco history on file. She reports that she does not drink alcohol or use illicit drugs.  Allergies: No Known Allergies  Medications Prior to Admission  Medication Dose Route Frequency Provider Last Rate Last Dose   ceFAZolin (ANCEF) IVPB 2 g/50 mL premix  2 g Intravenous 60 min Pre-Op Mark A Jenkins       enoxaparin (LOVENOX) 40 MG/0.4ML injection            enoxaparin (LOVENOX) injection 40 mg  40 mg Subcutaneous Once Mark A Jenkins   40 mg at 02/14/11 0730   fentaNYL (SUBLIMAZE) injection 25-50 mcg  25-50 mcg Intravenous Q5 min PRN Luis Gonzalez       lactated ringers 1,000 mL infusion   Intravenous Continuous Luis Gonzalez 20 mL/hr at 02/14/11 0730     lidocaine (XYLOCAINE) 1 % injection            midazolam (VERSED) 2 MG/2ML injection 1-2 mg  1-2 mg Intravenous Q5 Min x 3 PRN Luis Gonzalez   2 mg at 02/14/11 0911   ondansetron (ZOFRAN) 4 MG/2ML injection            ondansetron (ZOFRAN) injection 4  mg  4 mg Intravenous Once PRN Luis Gonzalez       propofol (DIPRIVAN) 10 MG/ML infusion            sodium chloride 0.9 % irrigation    PRN Mark A Jenkins   1,000 mL at 02/14/11 0952   succinylcholine (ANECTINE) 20 MG/ML injection            SUFentanil (SUFENTA) 50 MCG/ML injection            Medications Prior to Admission  Medication Sig Dispense Refill   acetaminophen (TYLENOL) 500 MG tablet Take 500 mg by mouth 2 (two) times daily.        calcium-vitamin D (OSCAL 500/200 D-3) 500-200 MG-UNIT per tablet Take 1 tablet by mouth 2 (two) times daily.        hydrochlorothiazide (,MICROZIDE/HYDRODIURIL,) 12.5 MG capsule TAKE 1 CAPSULE BY MOUTH  ONCE A DAY.  30 capsule  3   levothyroxine (SYNTHROID, LEVOTHROID) 100 MCG tablet Take 1 tablet (100 mcg total) by mouth daily.  30 tablet  5   lovastatin (MEVACOR) 20 MG tablet TAKE  ONE TABLET BY MOUTH AT BEDTIME.  30 tablet  3   potassium chloride (KLOR-CON M10) 10 MEQ tablet Take 1 tablet (10 mEq total) by mouth 2 (two) times daily.  60 tablet  5   meclizine (ANTIVERT) 12.5 MG tablet Take 12.5 mg by mouth 3 (three) times daily as needed.         No results found for this or any previous visit (from the past 48 hour(s)). No results found.  Review of Systems  Constitutional: Negative.   HENT: Negative.   Eyes: Negative.   Respiratory: Negative.   Cardiovascular: Negative.   Genitourinary: Negative.   Musculoskeletal: Negative.   Skin: Negative.   Neurological: Negative.   Endo/Heme/Allergies: Negative.   Psychiatric/Behavioral: Negative.     Temperature 98 F (36.7 C). Physical Exam  Constitutional: She appears well-developed and well-nourished.  HENT:  Head: Normocephalic.  Eyes: Pupils are equal, round, and reactive to light.  Neck: Neck supple.  Cardiovascular: Normal rate, regular rhythm and normal heart sounds.   Respiratory: Effort normal and breath sounds normal.  GI: Soft. She exhibits no distension. There is no  tenderness.  Genitourinary:       Two dominant masses in the lateral aspect of the left breast, with axillary lymphadenopathy.  No nipple discharge, dimpling.  Right breast exam unremarkable.  Pendulous breasts bilaterally.  Lymphadenopathy:    She has no cervical adenopathy.     Assessment/Plan *Left breast carcinoma/Scheduled for left modified radical mastectomy on 02/14/11.  Risks and benefits of procedure including bleeding, infection, and nerve injury were fully explained to the patient, who gives informed consent.  Arman Bogus 02/14/2011, 10:18 AM

## 2011-02-14 NOTE — Anesthesia Preprocedure Evaluation (Addendum)
Anesthesia Evaluation  Name, MR# and DOB Patient awake  General Assessment Comment  Reviewed: Allergy & Precautions, H&P  and Patient's Chart, lab work & pertinent test results  History of Anesthesia Complications Negative for: history of anesthetic complications  Airway Mallampati: III TM Distance: >3 FB Neck ROM: Full    Dental  (+) Teeth Intact and Missing   Pulmonary      Cardiovascular hypertension, Pt. on medications    Neuro/Psych (+) {AN ROS/MED HX NEURO HEADACHES (+) Depression,   GI/Hepatic/Renal   Endo/Other   (+)  Hypothyroidism (took synthroid today),  Abdominal   Musculoskeletal  Hematology   Peds  Reproductive/Obstetrics   Anesthesia Other Findings             Anesthesia Physical Anesthesia Plan  ASA: II  Anesthesia Plan: General   Post-op Pain Management:    Induction: Intravenous  Airway Management Planned: Oral ETT  Additional Equipment:   Intra-op Plan:   Post-operative Plan:   Informed Consent: I have reviewed the patients History and Physical, chart, labs and discussed the procedure including the risks, benefits and alternatives for the proposed anesthesia with the patient or authorized representative who has indicated his/her understanding and acceptance.     Plan Discussed with: CRNA  Anesthesia Plan Comments:       Anesthesia Quick Evaluation

## 2011-02-14 NOTE — Anesthesia Procedure Notes (Addendum)
Procedure Name: Intubation Date/Time: 02/14/2011 9:30 AM Performed by: Despina Hidden Pre-anesthesia Checklist: Patient identified, Emergency Drugs available, Suction available, Patient being monitored and Timeout performed Patient Re-evaluated:Patient Re-evaluated prior to inductionOxygen Delivery Method: Circle System Utilized Preoxygenation: Pre-oxygenation with 100% oxygen Intubation Type: IV induction

## 2011-02-14 NOTE — Op Note (Signed)
Preop diagnosis: Left breast carcinoma  Postoperative diagnosis: Same  Procedure: Left modified radical mastectomy  Surgeon Dr. Carmel Waddington  Anesthesia: Gen. endotracheal  Indications: Patient is a 77-year-old black female who presents with biopsy proven invasive mammary carcinoma the left breast. Due to the extensiveness of the cancer, the patient now comes to the operating room for left modified radical mastectomy. The risks and benefits of the procedure including bleeding, infection, difficulties, nerve injury, and possible blood transfusion were fully explained to the patient, informed consent.  Procedure note: The patient was placed in the supine position. After induction of general endotracheal anesthesia, the left breast and axilla were prepped and draped using the usual sterile technique the DuraPrep. Surgical site confirmation was performed.  An elliptical incision was made medial to the nipple. A superior flap was formed to the clavicle and an inferior flap to the chest wall.The breast was then removed medial to lateral from the pectoralis major muscle using electrocautery. A short suture was placed superiorly and a long suture placed laterally for orientation purposes. Next, a level II axillary dissection was performed. Care was taken to avoid the femoral neck the dorsal artery, vein, and nerve as well as the long thoracic nerve. Several palpable lymph nodes were noted within the specimen. Bleeding was controlled using clips. The left breast and axillary contents were then removed from the operative field and sent to pathology further examination. The wound is copiously irrigated with normal saline. A #10 flat Jackson-Pratt drain was placed into the left axilla and brought through separate stab wound inferior to the incision line. Just lateral to this exit site, an additional #10 flat Jackson-Pratt drain was placed underneath the skin flap. Both were secured to skin using 3-0 nylon the  subcutaneous was reapproximated using 2-0 Vicryl sutures. The skin was closed using staples. Betadine ointment after dressings were applied.  A taper needle counts were correct at the end of the procedure. The patient was extubated in the operating room back to the recovery room awake in stable condition.  Complications: None  Specimen: Left breast and axillary contents  Blood loss: None 100 cc  Drains: Drains x2 to left axilla and left breast flap 

## 2011-02-15 LAB — CBC
HCT: 34.5 % — ABNORMAL LOW (ref 36.0–46.0)
Hemoglobin: 11.2 g/dL — ABNORMAL LOW (ref 12.0–15.0)
MCH: 28.9 pg (ref 26.0–34.0)
MCHC: 32.5 g/dL (ref 30.0–36.0)
MCV: 88.9 fL (ref 78.0–100.0)
RBC: 3.88 MIL/uL (ref 3.87–5.11)

## 2011-02-15 LAB — BASIC METABOLIC PANEL
BUN: 8 mg/dL (ref 6–23)
CO2: 28 mEq/L (ref 19–32)
Chloride: 107 mEq/L (ref 96–112)
Creatinine, Ser: 0.7 mg/dL (ref 0.50–1.10)
Glucose, Bld: 101 mg/dL — ABNORMAL HIGH (ref 70–99)
Potassium: 3.5 mEq/L (ref 3.5–5.1)

## 2011-02-15 MED ORDER — SODIUM CHLORIDE 0.9 % IJ SOLN
INTRAMUSCULAR | Status: AC
  Administered 2011-02-15: 6 mL
  Filled 2011-02-15: qty 6

## 2011-02-15 MED ORDER — PANTOPRAZOLE SODIUM 40 MG PO TBEC
40.0000 mg | DELAYED_RELEASE_TABLET | Freq: Every day | ORAL | Status: DC
  Administered 2011-02-15 – 2011-02-16 (×2): 40 mg via ORAL
  Filled 2011-02-15 (×2): qty 1

## 2011-02-15 MED ORDER — ACETAMINOPHEN 10 MG/ML IV SOLN
1000.0000 mg | Freq: Four times a day (QID) | INTRAVENOUS | Status: AC
  Administered 2011-02-15 – 2011-02-16 (×4): 1000 mg via INTRAVENOUS
  Filled 2011-02-15 (×3): qty 100

## 2011-02-15 MED ORDER — ACETAMINOPHEN 10 MG/ML IV SOLN: 1000.0000 mg | Freq: Four times a day (QID) | INTRAVENOUS | Status: DC

## 2011-02-15 NOTE — Progress Notes (Signed)
1 Day Post-Op  Subjective: *Doing well.  Minimal incisional pain.**  Objective: Vital signs in last 24 hours: Temp:  [97.3 F (36.3 C)-98.9 F (37.2 C)] 98.9 F (37.2 C) (07/12 0600) Pulse Rate:  [57-78] 65  (07/12 0600) Resp:  [14-20] 20  (07/12 0600) BP: (113-162)/(60-90) 137/78 mmHg (07/12 0600) SpO2:  [93 %-100 %] 95 % (07/12 0838) Weight:  [88.2 kg (194 lb 7.1 oz)] 194 lb 7.1 oz (88.2 kg) (07/11 1707)    Intake/Output from previous day: 07/11 0701 - 07/12 0700 In: 3695 [P.O.:240; I.V.:3145; IV Piggyback:310] Out: 500 [Urine:300; Drains:200] Intake/Output this shift: I/O this shift: In: 360 [P.O.:360] Out: 225 [Urine:225]  General appearance: no distress Resp: clear to auscultation bilaterally Cardio: regular rate and rhythm Breast:  Dressing on left dry, intact.  JP drains with serosanguinous fluid. Lab Results:   BMET  Basename 02/15/11 0516  NA 141  K 3.5  CL 107  CO2 28  GLUCOSE 101*  BUN 8  CREATININE 0.70  CALCIUM 8.7     Assessment/Plan: s/p Procedure(s): MASTECTOMY MODIFIED RADICAL Advance diet, heplock IV, PT consult  LOS: 1 day    Lisa Klein A 02/15/2011

## 2011-02-15 NOTE — Progress Notes (Signed)
Physical Therapy Evaluation Patient Name: Lisa Klein Date: 02/15/2011 Problem List:  Patient Active Problem List  Diagnoses  . HYPOTHYROIDISM  . HYPERLIPIDEMIA  . OBESITY  . HYPERTENSION  . DEGENERATIVE JOINT DISEASE  . INTERMITTENT VERTIGO   Past Medical History:  Past Medical History  Diagnosis Date  . DJD (degenerative joint disease)   . Hypertension   . Hypothyroidism   . Obesity   . Degenerative disc disease     with nerve compression   . Hypothyroidism   . Hypertension   . Depression   . Cancer 2012    LEFT BREAST   Past Surgical History:  Past Surgical History  Procedure Date  . Vesicovaginal fistula closure w/ tah     [facility]  . 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 Overall Cognitive Status: Appears within functional limits for tasks assessed Orientation Level: Oriented X4 Sensation/Coordination Sensation Light Touch: Appears Intact Coordination Gross Motor Movements are Fluid and Coordinated: Yes Fine Motor Movements are Fluid and Coordinated: Yes Extremity Assessment RUE Assessment RUE Assessment: Within Functional Limits LUE Assessment LUE Assessment: Within Functional Limits RLE Assessment RLE Assessment: Within Functional Limits LLE Assessment LLE Assessment: Within Functional Limits Mobility (including Balance) Bed Mobility Bed  Mobility: No Transfers Transfers: No Ambulation/Gait Ambulation/Gait: Yes Ambulation/Gait Assistance: 7: Independent Ambulation Distance (Feet): 150 Feet Assistive device: None Gait Pattern: Trunk flexed Stairs: No  Balance Balance Assessed: No Exercise     End of Session PT - End of Session Activity Tolerance: Patient tolerated treatment well Patient left: in chair;with call bell in reach General Behavior During Session: [facility] for tasks performed Cognition: [facility] for tasks performed PT Assessment/Plan/Recommendation PT Assessment Clinical Impression Statement: pt. understands LUE cane exersise and has full ROM PT Recommendation/Assessment: Patent does not need any further PT services PT Goals     Konrad Penta 02/15/2011, 2:47 PM

## 2011-02-15 NOTE — Progress Notes (Signed)
Encounter addended by: Aurora Mask, CRNA on: 02/15/2011 12:19 PM<BR>     Documentation filed: Notes Section

## 2011-02-15 NOTE — Anesthesia Postprocedure Evaluation (Signed)
  Anesthesia Post-op Note  Patient: Lisa Klein  Procedure(s) Performed:  MASTECTOMY MODIFIED RADICAL  Patient Location: PACU patient in room #05  Anesthesia Type: General  Level of Consciousness: oriented  Airway and Oxygen Therapy: Patient Spontanous Breathing  Post-op Pain: 2 /10  Post-op Assessment: Post-op Vital signs reviewed, Respiratory Function Stable and Patent Airway  Post-op Vital Signs: stable  Complications: No apparent anesthesia complications

## 2011-02-15 NOTE — Progress Notes (Signed)
  Patient was seen for instruction in Post Mastectomy shoulder ROM exercise.  Written program was given.  She understands all teaching.  No further PT is indicated.  Pt. Will follow up with MD for all other needs.

## 2011-02-16 LAB — BASIC METABOLIC PANEL
BUN: 8 mg/dL (ref 6–23)
Calcium: 8.4 mg/dL (ref 8.4–10.5)
Chloride: 107 mEq/L (ref 96–112)
Creatinine, Ser: 0.63 mg/dL (ref 0.50–1.10)
GFR calc Af Amer: >60 mL/min (ref >60)

## 2011-02-16 LAB — CBC
HCT: 34.6 % — ABNORMAL LOW (ref 36.0–46.0)
MCH: 28.7 pg (ref 26.0–34.0)
MCV: 91.1 fL (ref 78.0–100.0)
Platelets: 144 10*3/uL — ABNORMAL LOW (ref 150–400)
RDW: 14.6 % (ref 11.5–15.5)

## 2011-02-16 MED ORDER — HYDROCODONE-ACETAMINOPHEN 5-500 MG PO TABS: 1.0000 | ORAL_TABLET | ORAL | Status: DC | PRN

## 2011-02-16 NOTE — Discharge Summary (Signed)
Physician Discharge Summary  Patient ID: Lisa Klein MRN: [id number] DOB/AGE: Apr 07, 1930 77 y.o.  Admit date: 02/14/2011 Discharge date: 02/16/2011  Admission Diagnoses:Left breast carcinoma  Discharge Diagnoses: Left breast carcinoma  Discharged Condition: Stable, improving  Hospital Course: *Patient is a 77 year old black female who presented to the hospital on 02/14/2011 and underwent a left modified radical mastectomy. She tolerated procedure well. Postoperative course has been unremarkable. Her diet was advanced without difficulty. Final pathology is still pending.   Discharge Exam: Blood pressure 126/76, pulse 75, temperature 97.8 F (36.6 C), temperature source Oral, resp. rate 16, height 5\' 6"  (1.676 m), weight 88.2 kg (194 lb 7.1 oz), SpO2 98.00%.   Disposition: home  Discharge Orders    Future Appointments: Provider: Department: Dept Phone: Center:   05/16/2011 4:00 PM [name], MD [facility] [phone] [facility]     Future Orders Please Complete By Expires   Diet - low sodium heart healthy      Increase activity slowly      Discharge instructions      Comments:   Drain and record bulb suction twice a day   Other Restrictions      Comments:   No heavy lifting, left arm.   No dressing needed      Call MD for:  temperature >100.4      Call MD for:  persistant nausea and vomiting      Call MD for:  redness, tenderness, or signs of infection (pain, swelling, redness, odor or green/yellow discharge around incision site)        Current Discharge Medication List    START taking these medications   Details  HYDROcodone-acetaminophen (VICODIN) 5-500 MG per tablet Take 1 tablet by mouth every 4 (four) hours as needed for pain. Qty: 30 tablet, Refills: 0      CONTINUE these medications which have NOT CHANGED   Details  acetaminophen (TYLENOL) 500 MG tablet Take 500 mg by mouth 2 (two) times daily.     calcium-vitamin D (OSCAL 500/200 D-3) 500-200  MG-UNIT per tablet Take 1 tablet by mouth 2 (two) times daily.     hydrochlorothiazide (,MICROZIDE/HYDRODIURIL,) 12.5 MG capsule TAKE 1 CAPSULE BY MOUTH  ONCE A DAY. Qty: 30 capsule, Refills: 3    levothyroxine (SYNTHROID, LEVOTHROID) 100 MCG tablet Take 1 tablet (100 mcg total) by mouth daily. Qty: 30 tablet, Refills: 5    lovastatin (MEVACOR) 20 MG tablet TAKE ONE TABLET BY MOUTH AT BEDTIME. Qty: 30 tablet, Refills: 3    potassium chloride (KLOR-CON M10) 10 MEQ tablet Take 1 tablet (10 mEq total) by mouth 2 (two) times daily. Qty: 60 tablet, Refills: 5    meclizine (ANTIVERT) 12.5 MG tablet Take 12.5 mg by mouth 3 (three) times daily as needed.          SignedFranky Macho A 02/16/2011, 2:47 PM

## 2011-02-16 NOTE — Plan of Care (Signed)
Problem: Consults Goal: Diagnosis-Modified Radical Mastectomy Outcome: Completed/Met Date Met:  02/16/11 Left Mastectomy

## 2011-02-16 NOTE — Progress Notes (Signed)
Pt to be d/c home with family member. All d/c instructions complete. All questions and concerns answered. Pt to be d/c home with 2 JP drains. Went over with pt on how to empty and record amount out.

## 2011-02-16 NOTE — Progress Notes (Signed)
2 Days Post-Op  Subjective: *Patient doing well. Incisional pain minimal.**  Objective: Vital signs in last 24 hours: Temp:  [97.8 F (36.6 C)-98.8 F (37.1 C)] 97.8 F (36.6 C) (07/13 0600) Pulse Rate:  [60-75] 75  (07/13 0600) Resp:  [16-20] 16  (07/13 0600) BP: (126-164)/(71-76) 126/76 mmHg (07/13 0600) SpO2:  [95 %-99 %] 98 % (07/13 0600)    Intake/Output from previous day: 07/12 0701 - 07/13 0700 In: 2236 [P.O.:1260; I.V.:776; IV Piggyback:200] Out: 1640 [Urine:1525; Drains:115] Intake/Output this shift: I/O this shift: In: 860 [P.O.:860] Out: 1200 [Urine:1200]  General appearance: alert and cooperative Resp: clear to auscultation bilaterally Cardio: regular rate and rhythm, S1, S2 normal, no murmur, click, rub or gallop Left breast incision healing well.  JPs with serosanguinous drainage.  Lab Results:   Basename 02/16/11 0455 02/15/11 0516  WBC 3.6* 4.6  HGB 10.9* 11.2*  HCT 34.6* 34.5*  PLT 144* 157   BMET  Basename 02/16/11 0455 02/15/11 0516  NA 141 141  K 3.4* 3.5  CL 107 107  CO2 29 28  GLUCOSE 94 101*  BUN 8 8  CREATININE 0.63 0.70  CALCIUM 8.4 8.7    Anti-infectives: Anti-infectives    None      Assessment/Plan: s/p Procedure(s): MASTECTOMY MODIFIED RADICAL, Left Discharge home today.  LOS: 2 days    Everard Interrante A 02/16/2011

## 2011-02-21 LAB — TYPE AND SCREEN: Antibody Screen: NEGATIVE

## 2011-03-28 ENCOUNTER — Encounter (HOSPITAL_COMMUNITY): Payer: Self-pay | Admitting: Oncology

## 2011-03-28 ENCOUNTER — Encounter (HOSPITAL_COMMUNITY): Payer: Medicare HMO | Attending: Oncology | Admitting: Oncology

## 2011-03-28 VITALS — BP 126/76 | HR 81 | Temp 98.2°F | Ht 63.0 in | Wt 187.8 lb

## 2011-03-28 DIAGNOSIS — Z79899 Other long term (current) drug therapy: Secondary | ICD-10-CM | POA: Insufficient documentation

## 2011-03-28 DIAGNOSIS — Z170 Estrogen receptor positive status [ER+]: Secondary | ICD-10-CM

## 2011-03-28 DIAGNOSIS — E039 Hypothyroidism, unspecified: Secondary | ICD-10-CM | POA: Insufficient documentation

## 2011-03-28 DIAGNOSIS — I10 Essential (primary) hypertension: Secondary | ICD-10-CM | POA: Insufficient documentation

## 2011-03-28 DIAGNOSIS — C773 Secondary and unspecified malignant neoplasm of axilla and upper limb lymph nodes: Secondary | ICD-10-CM | POA: Insufficient documentation

## 2011-03-28 DIAGNOSIS — C50919 Malignant neoplasm of unspecified site of unspecified female breast: Secondary | ICD-10-CM | POA: Insufficient documentation

## 2011-03-28 LAB — CBC
HCT: 40.1 % (ref 36.0–46.0)
Hemoglobin: 12.9 g/dL (ref 12.0–15.0)
MCH: 28.9 pg (ref 26.0–34.0)
MCV: 89.9 fL (ref 78.0–100.0)
RBC: 4.46 MIL/uL (ref 3.87–5.11)
WBC: 4.6 10*3/uL (ref 4.0–10.5)

## 2011-03-28 LAB — COMPREHENSIVE METABOLIC PANEL
AST: 19 U/L (ref 0–37)
BUN: 12 mg/dL (ref 6–23)
CO2: 28 mEq/L (ref 19–32)
Chloride: 104 mEq/L (ref 96–112)
Creatinine, Ser: 0.78 mg/dL (ref 0.50–1.10)
GFR calc Af Amer: >60 mL/min (ref >60)
GFR calc non Af Amer: >60 mL/min (ref >60)
Glucose, Bld: 92 mg/dL (ref 70–99)
Total Bilirubin: 0.4 mg/dL (ref 0.3–1.2)

## 2011-03-28 LAB — DIFFERENTIAL
Basophils Relative: 1 % (ref 0–1)
Eosinophils Relative: 2 % (ref 0–5)
Lymphs Abs: 1.8 10*3/uL (ref 0.7–4.0)
Monocytes Relative: 12 % (ref 3–12)
Neutro Abs: 2.1 10*3/uL (ref 1.7–7.7)

## 2011-03-28 NOTE — Progress Notes (Signed)
This office note has been dictated.

## 2011-03-28 NOTE — Patient Instructions (Signed)
[facility]  Discharge Instructions  RECOMMENDATIONS MADE BY THE CONSULTANT AND ANY TEST RESULTS WILL BE SENT TO YOUR REFERRING DOCTOR.        SPECIAL INSTRUCTIONS/FOLLOW-UP: We will schedule a PET scan for you at [facility] available. We will make an appointment with either Dr.Wentworth or Dr.Kinard at [facility] after your PET scan (they are the radiation oncologist). We will also get you an appointment back with Dr.Jenkins for a port-a-cath placement (will need port for chemotherapy). We will have you come back and see Dr.Neijstrom after PET scan and appointment with radiation oncologist. We will call you tomorrow with these appointments.   I acknowledge that I have been informed and understand all the instructions given to me and received a copy. I do not have any more questions at this time, but understand that I may call the Specialty Clinic at [facility] at [phone] during business hours should I have any further questions or need assistance in obtaining follow-up care.    __________________________________________  _____________  __________ Signature of Patient or Authorized Representative            Date                   Time    __________________________________________ Nurse's Signature

## 2011-03-28 NOTE — Progress Notes (Signed)
CC:   Lisa Klein. Lodema Hong, M.D. Dalia Heading, M.D.  DIAGNOSES: 1. Advanced local regional breast cancer with 4 foci of invasive     breast cancer surrounding a large area of DCIS.  She also had 6/7     positive nodes found pathologically and extensive LVI was seen.     The largest focus of invasive carcinoma was 1.5 cm.  There are 2     lesions of that size and then the third lesion was 1 cm and the     fourth was 0.7 cm.  She underwent this left modified radical     mastectomy on 02/14/2011.  This was felt to be a grade 3 cancer.     She had associated DCIS that was high-grade as well.  Again 6/7     lymph nodes were involved.  Extracapsular extension was seen in 4.     ER receptors were 100%, PR receptors only 14%, Ki- 67 marker high     at 45% and HER2 was not over expressed. 2. History of hypertension on a HydroDIURIL like medication for the     last 5 years. 3. Mild excessive weight. 4. History of appendectomy and cholecystectomy simultaneously many     years ago. 5. History of hysterectomy for excessive uterine bleeding many years     ago. 6. History of moderately poor dental hygiene. 7. History of degenerative arthritis. 8. History of hyperlipidemia. 9. History of hypothyroidism and she is on levothyroxine 100 mcg a     day.  This is a very pleasant lady who is accompanied by her daughter and her son [name].  They are her only children.  She had her routine mammogram in June not aware of any changes in her breasts she states over the previous year.  She was called with results which suggested she had a mass in the breast.  She then was biopsied and Dr. Lovell Sheehan subsequently did the above-mentioned left modified radical mastectomy.  Her stage therefore is T1c N2 MX breast cancer giving her stage IIIA disease at this time.  That is the clinical and pathological evaluation at this juncture.  She has not seen a radiation therapist.  Of note also on the  pathology, however, was that the posterior surgical margin was involved by tumor cells.  As I mentioned above LVI was extensive and she also had extranodal extension in several lymph nodes.  REVIEW OF SYSTEMS:  Oncologically, she is not aware of any lumps anywhere.  No nausea, no vomiting.  No headaches.  No GI or GU complaints.  No history of heart disease.  No shortness of breath or chest pain.  She never smoked, never drank.  FAMILY HISTORY:  Her mother is 35 and still living and she is one of her caregivers along with a couple of her other sisters.  She has several sisters.  She is second of the four girls and no one else has breast cancer.  Her daughter does not have a history of breast cancer.  SOCIAL HISTORY:  As I mentioned she is a nonsmoker and nondrinker.  PHYSICAL EXAMINATION:  She is a very pleasant lady, 5 feet 3 inches tall, weight 187 pounds, blood pressure 126/76.  She is in no acute distress.  Her BSA is 1.95 m2.  Her pulse is 80 and regular, respirations 16 and unlabored.  She is afebrile.  Skin is warm and dry to the touch.  She denies any pain at this time.  She has no lymphadenopathy in the cervical, supraclavicular, infraclavicular, axillary, or inguinal areas.  Her lungs are clear to auscultation and percussion.  Heart shows a regular rhythm and rate without obvious murmur, rub or gallop.  Her right breast is negative for any masses. The left chest wall has a large incision which is healing very, very nicely.  There is no nodularity or skin nodules.  Her abdomen is soft and nontender without organomegaly or masses.  Bowel sounds are normal. She has no peripheral edema.  Pulses 1-2 plus and symmetrical. Posterior tibialis pulses are only trace but symmetrical.  She is right- handed.  She has teeth which are only in fair repair.  Tongue is normal and in the midline.  Throat is clear.  Pupils equally round and reactive to light.  She is alert, she is  oriented very mentally with it at this time.  I think this lady with extensive local regional disease needs a PET scan to make sure she does not have metastatic disease rather than just stage III disease.  I think that would influence what we do.  If she does not have metastatic disease I think we should give her a combination of chemotherapy and I think we can find a regimen for her to tolerate followed by radiation therapy and hormonal therapy.  If she has stage IV disease I think we need to reconsider what our goals are because it will not be curative at that time.  I will see her back as soon as we get a PET scan, radiation therapy consultation and we will call Dr. Lovell Sheehan' office to let him know that we will probably need a port placed in the near future.    ______________________________ Ladona Horns. Mariel Sleet, MD ESN/MEDQ  D:  03/28/2011  T:  03/28/2011  Job:  [id number]

## 2011-03-29 LAB — CANCER ANTIGEN 27.29: CA 27.29: 16 U/mL (ref 0–39)

## 2011-04-05 ENCOUNTER — Ambulatory Visit
Admission: RE | Admit: 2011-04-05 | Discharge: 2011-04-05 | Disposition: A | Discharge status: Home / Self Care | Payer: Medicare HMO | Source: Ambulatory Visit | Attending: Radiation Oncology | Admitting: Radiation Oncology

## 2011-04-05 ENCOUNTER — Encounter (HOSPITAL_COMMUNITY): Payer: Self-pay

## 2011-04-05 ENCOUNTER — Encounter (HOSPITAL_COMMUNITY)
Admission: RE | Admit: 2011-04-05 | Discharge: 2011-04-05 | Disposition: A | Discharge status: Home / Self Care | Payer: Medicare HMO | Source: Ambulatory Visit | Attending: Oncology | Admitting: Oncology

## 2011-04-05 DIAGNOSIS — Z90710 Acquired absence of both cervix and uterus: Secondary | ICD-10-CM | POA: Insufficient documentation

## 2011-04-05 DIAGNOSIS — Z79899 Other long term (current) drug therapy: Secondary | ICD-10-CM | POA: Insufficient documentation

## 2011-04-05 DIAGNOSIS — IMO0002 Reserved for concepts with insufficient information to code with codable children: Secondary | ICD-10-CM | POA: Insufficient documentation

## 2011-04-05 DIAGNOSIS — C50919 Malignant neoplasm of unspecified site of unspecified female breast: Secondary | ICD-10-CM | POA: Insufficient documentation

## 2011-04-05 DIAGNOSIS — E7800 Pure hypercholesterolemia, unspecified: Secondary | ICD-10-CM | POA: Insufficient documentation

## 2011-04-05 DIAGNOSIS — Y836 Removal of other organ (partial) (total) as the cause of abnormal reaction of the patient, or of later complication, without mention of misadventure at the time of the procedure: Secondary | ICD-10-CM | POA: Insufficient documentation

## 2011-04-05 DIAGNOSIS — E039 Hypothyroidism, unspecified: Secondary | ICD-10-CM | POA: Insufficient documentation

## 2011-04-05 DIAGNOSIS — I10 Essential (primary) hypertension: Secondary | ICD-10-CM | POA: Insufficient documentation

## 2011-04-05 DIAGNOSIS — Z9010 Acquired absence of unspecified breast and nipple: Secondary | ICD-10-CM | POA: Insufficient documentation

## 2011-04-05 LAB — GLUCOSE, CAPILLARY: Glucose-Capillary: 96 mg/dL (ref 70–99)

## 2011-04-05 MED ORDER — FLUDEOXYGLUCOSE F - 18 (FDG) INJECTION
17.7000 | Freq: Once | INTRAVENOUS | Status: AC | PRN
  Administered 2011-04-05: 17.7 via INTRAVENOUS

## 2011-04-10 ENCOUNTER — Encounter (HOSPITAL_COMMUNITY): Payer: Medicare HMO | Attending: Oncology | Admitting: Oncology

## 2011-04-10 ENCOUNTER — Encounter (HOSPITAL_COMMUNITY): Payer: Self-pay | Admitting: Oncology

## 2011-04-10 VITALS — BP 119/77 | HR 65 | Temp 97.7°F | Wt 185.5 lb

## 2011-04-10 DIAGNOSIS — C50919 Malignant neoplasm of unspecified site of unspecified female breast: Secondary | ICD-10-CM | POA: Insufficient documentation

## 2011-04-10 DIAGNOSIS — C773 Secondary and unspecified malignant neoplasm of axilla and upper limb lymph nodes: Secondary | ICD-10-CM

## 2011-04-10 DIAGNOSIS — Z170 Estrogen receptor positive status [ER+]: Secondary | ICD-10-CM

## 2011-04-10 DIAGNOSIS — C801 Malignant (primary) neoplasm, unspecified: Secondary | ICD-10-CM | POA: Insufficient documentation

## 2011-04-10 DIAGNOSIS — R82998 Other abnormal findings in urine: Secondary | ICD-10-CM | POA: Insufficient documentation

## 2011-04-10 NOTE — Patient Instructions (Addendum)
22 Lisa Klein  04/10/2011   Your procedure is scheduled on:  04/16/2011  Report to [facility] at  715 AM.  Call this number if you have problems the morning of surgery: [phone]   Remember:   Do not eat food:After Midnight.  Do not drink clear liquids: After Midnight.  Take these medicines the morning of surgery with A SIP OF WATER: hctz,vicodin,levothyroxine,antivert(if needed)   Do not wear jewelry, make-up or nail polish.  Do not wear lotions, powders, or perfumes. You may wear deodorant.  Do not shave 48 hours prior to surgery.  Do not bring valuables to the hospital.  Contacts, dentures or bridgework may not be worn into surgery.  Leave suitcase in the car. After surgery it may be brought to your room.  For patients admitted to the hospital, checkout time is 11:00 AM the day of discharge.   Patients discharged the day of surgery will not be allowed to drive home.  Name and phone number of your driver: family  Special Instructions: CHG Shower Use Special Wash: 1/2 bottle night before surgery and 1/2 bottle morning of surgery.   Please read over the following fact sheets that you were given: Pain Booklet, MRSA Information, Surgical Site Infection Prevention, Anesthesia Post-op Instructions and Care and Recovery After Surgery PATIENT INSTRUCTIONS POST-ANESTHESIA  IMMEDIATELY FOLLOWING SURGERY:  Do not drive or operate machinery for the first twenty four hours after surgery.  Do not make any important decisions for twenty four hours after surgery or while taking narcotic pain medications or sedatives.  If you develop intractable nausea and vomiting or a severe headache please notify your doctor immediately.  FOLLOW-UP:  Please make an appointment with your surgeon as instructed. You do not need to follow up with anesthesia unless specifically instructed to do so.  WOUND CARE INSTRUCTIONS (if applicable):  Keep a dry clean dressing on the anesthesia/puncture wound site if there is  drainage.  Once the wound has quit draining you may leave it open to air.  Generally you should leave the bandage intact for twenty four hours unless there is drainage.  If the epidural site drains for more than 36-48 hours please call the anesthesia department.  QUESTIONS?:  Please feel free to call your physician or the hospital operator if you have any questions, and they will be happy to assist you.     [address] [facility] Anesthesia Department [address] [place] [phone]

## 2011-04-10 NOTE — Progress Notes (Signed)
DIAGNOSIS:  Stage III cancer of the left breast, status post mastectomy. She had multiple positive nodes and a grade 3 cancer.  Ki-67 marker and was estrogen receptor positive, progesterone receptor mildly positive, and HER2-negative.  She had a CT scan, therefore, which I reviewed with her and her sister today who accompanied her.  SUBJECTIVE:  She does not have metastatic disease, but she still has a positive node deep in the left axilla next to the chest wall beneath the subpectoral areas.  ASSESSMENT AND PLAN:  I have proposed her, therefore, chemotherapy consisting of epirubicin and Cytoxan which I think she would tolerate better than Adriamycin and Cytoxan.  I would do that for 6 cycles every 21 days with Neulasta support and then proceed with radiation therapy and then hormonal therapy.  I think her performance status is still 0-1 and she ,is in excellent condition but I think we need to give her a chemotherapy that hopefully she can tolerate well.  We will get a radiation therapy appointment.  She has an appointment already to have a Port-A-Cath placed in the near future.  We will get started the day after that which will be next Tuesday.  The port will be placed Monday.  We will then need to see her back in a few weeks.  We will start as soon as we can get the port placed as I mentioned.  We have gone over side effects with her and her sister today.  She is willing to proceed.  So we spent about 45 minutes together going over the results, the proposed therapy, consultation, coordination of care, and counseling.    ______________________________ Ladona Horns. Mariel Sleet, MD ESN/MEDQ  D:  04/10/2011  T:  04/10/2011  Job:  [id number]

## 2011-04-10 NOTE — Progress Notes (Signed)
This office note has been dictated.

## 2011-04-10 NOTE — Patient Instructions (Signed)
[facility]  Discharge Instructions  RECOMMENDATIONS MADE BY THE CONSULTANT AND ANY TEST RESULTS WILL BE SENT TO YOUR REFERRING DOCTOR.   EXAM FINDINGS BY MD TODAY AND SIGNS AND SYMPTOMS TO REPORT TO CLINIC OR PRIMARY MD:   We will start chemotherapy with Epirubicin and Cytoxan for 6 cycles (starting next week). 1 day every 21 days is a cycle.   You will need a portacath - Dr. Lovell Sheehan to place port on Monday 9/10  Chemo teaching: Thursday 9/6 after 2D Echo. 2D Echo is scheduled for 9/6 at 10:30.  Chemo appointment time: Monday 9/10 at 10:15.  If you get a surgery time of later than 8:45 please call Rolly Salter or [place] at [phone] and have your appointment changed to Thursday.      I acknowledge that I have been informed and understand all the instructions given to me and received a copy. I do not have any more questions at this time, but understand that I may call the Specialty Clinic at [facility] at [phone] during business hours should I have any further questions or need assistance in obtaining follow-up care.    __________________________________________  _____________  __________ Signature of Patient or Authorized Representative            Date                   Time    __________________________________________ Nurse's Signature

## 2011-04-11 ENCOUNTER — Encounter (HOSPITAL_COMMUNITY): Payer: Self-pay

## 2011-04-11 ENCOUNTER — Other Ambulatory Visit (HOSPITAL_COMMUNITY): Payer: Self-pay | Admitting: Oncology

## 2011-04-11 ENCOUNTER — Encounter (HOSPITAL_COMMUNITY)
Admission: RE | Admit: 2011-04-11 | Discharge: 2011-04-11 | Disposition: A | Discharge status: Home / Self Care | Payer: Medicare HMO | Source: Ambulatory Visit | Attending: General Surgery | Admitting: General Surgery

## 2011-04-11 NOTE — H&P (Signed)
NTS SOAP Note  Vital Signs:  Vitals as of: 02/06/2011: Systolic 128: Diastolic 74: Heart Rate 70: Temp 97.38F: Height 20ft 5in: Weight 191Lbs 0 Ounces: Pain Level 0: BMI 32  BMI : 31.78 kg/m2  Subjective: This 36 Years 87 Months old Female presents for a portacath.  Is about to undergo chemotherapy. Review of Symptoms:  Constitutional: unremarkable  Head: unremarkable  Eyes:unremarkable  Nose/Mouth/Throat:unremarkable  Cardiovascular:unremarkable  Respiratory: unremarkable  Gastrointestinal:unremarkable  Genitourinary: unremarkable  Musculoskeletal: unremarkable  Skin: unremarkable  see above Hematolgic/Lymphatic: unremarkable  Allergic/Immunologic:unremarkable     Past Medical History:Reviewed  Past Medical History  Surgical History: cholecystectomy Medical Problems: High Blood pressure, High cholesterol, Hypothyroidism Allergies: nkda Medications: KCl, lovastatin, HCTZ, synthroid   Social History: Reviewed   Social History  Preferred Language: English ([place]) Race: Black or African American Ethnicity: Not Hispanic / Latino Age: 77 Years 8 Months Marital Status: W Alcohol: No Recreational drug(s): No   Smoking Status: Never smoker reviewed on 02/06/2011  Family History: Reviewed  Family History  Is there a family history of:CAD, No family h/o breast cancer   Objective Information:  General: Well appearing, well nourished in no distress.  Skin:no rash or prominent lesions  Head: Atraumatic; no masses; no abnormalities  Eyes: conjunctiva clear, EOM intact, PERRL  Neck: Supple without lymphadenopathy.  Heart: RRR, no murmur or gallop. Normal S1, S2. No S3, S4.  Lungs:CTA bilaterally, no wheezes, rhonchi, rales. Breathing unlabored.  Two dominant masses in the lateral aspect of the left breast. No nipple discharge, dimpling. Palpable nodes in the left axilla. Right breast unremarkable. Pendulous breasts. Abdomen:unremarkable  Lymphatics:  LymphBrief see axillary exam above  MRI: report reviewed. Assessment: Left breast carcinoma, Stage 2-3  Diagnosis & Procedure: DiagnosisCode: 174.4, ProcedureCode: 16109,   Orders:preop orders written    Plan:Scheduled for portacath insertion on 04/16/11.    Patient Education: Alternative treatments to surgery were discussed with patient (and family).Risks and benefits of procedure were fully explained to the patient (and family) who gave informed consent. Patient/family questions were addressed.  Follow-up: Pending Surgery

## 2011-04-12 ENCOUNTER — Encounter (HOSPITAL_COMMUNITY): Payer: Medicare HMO

## 2011-04-12 ENCOUNTER — Ambulatory Visit (HOSPITAL_COMMUNITY)
Admission: RE | Admit: 2011-04-12 | Discharge: 2011-04-12 | Disposition: A | Discharge status: Home / Self Care | Payer: Medicare HMO | Source: Ambulatory Visit | Attending: Oncology | Admitting: Oncology

## 2011-04-12 DIAGNOSIS — I369 Nonrheumatic tricuspid valve disorder, unspecified: Secondary | ICD-10-CM

## 2011-04-12 DIAGNOSIS — C801 Malignant (primary) neoplasm, unspecified: Secondary | ICD-10-CM

## 2011-04-12 DIAGNOSIS — C50919 Malignant neoplasm of unspecified site of unspecified female breast: Secondary | ICD-10-CM

## 2011-04-12 MED ORDER — PROCHLORPERAZINE 25 MG RE SUPP: 25.0000 mg | Freq: Four times a day (QID) | RECTAL | Status: DC | PRN

## 2011-04-12 MED ORDER — DEXAMETHASONE 4 MG PO TABS: ORAL_TABLET | ORAL | Status: DC

## 2011-04-12 MED ORDER — ONDANSETRON HCL 8 MG PO TABS: ORAL_TABLET | ORAL | Status: DC

## 2011-04-12 NOTE — Patient Instructions (Signed)
[facility]   CHEMOTHERAPY INSTRUCTIONS   POTENTIAL SIDE EFFECTS OF TREATMENT: Increased Susceptibility to Infection, Vomiting, Constipation, Red or Pink Urine (with Adriamycin), Hair Thinning, Changes in Character of Skin and Nails (brittleness, dryness,etc.), Pigment Changes (darkening of veins, nail beds, palms of hands, soles of feet, etc.), Bone Marrow Suppression, Abdominal Cramping, Urinary Frequency and Blood in Urine   SELF IMAGE NEEDS AND REFERRALS MADE: Obtain hair accessories as soon as possible (wigs, scarves, turbans,caps,etc.), Referral to Look Good, Feel Better consultant and Radiation Therapy   EDUCATIONAL MATERIALS GIVEN AND REVIEWED: Chemotherapy and You, Radiation Therapy and You and Care of Venous Access Device   SELF CARE ACTIVITIES WHILE ON CHEMOTHERAPY: Increase your fluid intake 48 hours prior to treatment and drink at least 2 quarts per day after treatment., No alcohol intake., No aspirin or other medications unless approved by your oncologist., Eat foods that are light and easy to digest., Eat foods at cold or room temperature., No fried, fatty, or spicy foods immediately before or after treatment., Have teeth cleaned professionally before starting treatment. Keep dentures and partial plates clean., Use soft toothbrush and do not use mouthwashes that contain alcohol. Biotene is a good mouthwash that is available at most pharmacies or may be ordered by calling [phone]., Use warm salt water gargles (1 teaspoon salt per 1 quart warm water) before and after meals and at bedtime. Or you may rinse with 2 tablespoons of three -percent hydrogen peroxide mixed in eight ounces of water., Always use sunscreen with SPF (Sun Protection Factor) of 15 or higher. and Use your nausea medication as directed to prevent nausea.   MEDICATIONS: You have been given prescriptions for the following medications:  Ativan 1mg  every 3 to 4 hours as  needed for nausea or vomiting (may take 1/2 tablet to 1 tablet) may also take this med at bedtime if can not sleep Compazine suppositories 25mg  1 per rectum every 6 hours as needed for nausea or vomiting  Decadron 4mg  tablet. Take 2 tablets the day after chemo. Then take 2 tablets two times a day x 2 days. Take with food. This will help decrease your risk for nausea/vomiting.  EMLA cream - apply a quarter sized glob to port site 1 hour before chemotherapy. This will numb your port site. Do not rub in and cover with plastic.  Zofran 8 mg tablet - you may take this 2 times a day as needed for nausea/vomiting starting on the 3rd day after chemo.    SYMPTOMS TO REPORT AS SOON AS POSSIBLE AFTER TREATMENT:  FEVER GREATER THAN 101.0 F  CHILLS WITH OR WITHOUT FEVER  NAUSEA AND VOMITING THAT IS NOT CONTROLLED WITH YOUR NAUSEA MEDICATION  UNUSUAL SHORTNESS OF BREATH  UNUSUAL BRUISING OR BLEEDING  TENDERNESS IN MOUTH AND THROAT WITH OR WITHOUT PRESENCE OF ULCERS  URINARY PROBLEMS  BOWEL PROBLEMS  UNUSUAL RASH    Wear comfortable clothing and clothing appropriate for easy access to any Portacath or PICC line. Let us know if there is anything that we can do to make your therapy better!      I have been informed and understand all of the instructions given to me and have received a copy. I have been instructed to call the clinic [phone] or my family physician as soon as possible for continued medical care, if indicated. I do not have any more questions at this time but understand that I may call the Cancer Center or  the Patient Navigator at [phone] during office hours should I have questions or need assistance in obtaining follow-up care.      _________________________________________      _______________     __________ Signature of Patient or Authorized Representative        Date                            Time      _________________________________________ Nurse's  Signature

## 2011-04-12 NOTE — Progress Notes (Signed)
Consent signed for chemotherapy

## 2011-04-12 NOTE — Progress Notes (Signed)
*  PRELIMINARY RESULTS* Echocardiogram 2D Echocardiogram has been performed.  Lisa Klein 04/12/2011, 11:32 AM

## 2011-04-16 ENCOUNTER — Ambulatory Visit (HOSPITAL_COMMUNITY): Payer: Medicare HMO

## 2011-04-16 ENCOUNTER — Encounter (HOSPITAL_COMMUNITY): Payer: Self-pay | Admitting: Anesthesiology

## 2011-04-16 ENCOUNTER — Encounter (HOSPITAL_COMMUNITY)
Admission: RE | Disposition: A | Discharge status: Home / Self Care | Payer: Self-pay | Source: Ambulatory Visit | Attending: General Surgery

## 2011-04-16 ENCOUNTER — Inpatient Hospital Stay (HOSPITAL_COMMUNITY): Payer: Medicare HMO

## 2011-04-16 ENCOUNTER — Ambulatory Visit (HOSPITAL_COMMUNITY): Payer: Medicare HMO | Admitting: Anesthesiology

## 2011-04-16 ENCOUNTER — Ambulatory Visit (HOSPITAL_COMMUNITY)
Admission: RE | Admit: 2011-04-16 | Discharge: 2011-04-16 | Disposition: A | Discharge status: Home / Self Care | Payer: Medicare HMO | Source: Ambulatory Visit | Attending: General Surgery | Admitting: General Surgery

## 2011-04-16 ENCOUNTER — Encounter (HOSPITAL_COMMUNITY): Payer: Self-pay | Admitting: *Deleted

## 2011-04-16 DIAGNOSIS — C50419 Malignant neoplasm of upper-outer quadrant of unspecified female breast: Secondary | ICD-10-CM | POA: Insufficient documentation

## 2011-04-16 HISTORY — PX: PORTACATH PLACEMENT: SHX2246

## 2011-04-16 SURGERY — INSERTION, TUNNELED CENTRAL VENOUS DEVICE, WITH PORT
Anesthesia: Monitor Anesthesia Care | Laterality: Right | Wound class: Clean

## 2011-04-16 MED ORDER — KETOROLAC TROMETHAMINE 30 MG/ML IJ SOLN
INTRAMUSCULAR | Status: AC
  Filled 2011-04-16: qty 1

## 2011-04-16 MED ORDER — FENTANYL CITRATE 0.05 MG/ML IJ SOLN
INTRAMUSCULAR | Status: DC | PRN
  Administered 2011-04-16 (×2): 50 ug via INTRAVENOUS

## 2011-04-16 MED ORDER — PROPOFOL 10 MG/ML IV EMUL
INTRAVENOUS | Status: AC
  Filled 2011-04-16: qty 20

## 2011-04-16 MED ORDER — MIDAZOLAM HCL 2 MG/2ML IJ SOLN
INTRAMUSCULAR | Status: AC
  Filled 2011-04-16: qty 2

## 2011-04-16 MED ORDER — LIDOCAINE HCL (PF) 1 % IJ SOLN
INTRAMUSCULAR | Status: AC
  Filled 2011-04-16: qty 30

## 2011-04-16 MED ORDER — LACTATED RINGERS IV SOLN
INTRAVENOUS | Status: DC
  Administered 2011-04-16 (×2): via INTRAVENOUS

## 2011-04-16 MED ORDER — FENTANYL CITRATE 0.05 MG/ML IJ SOLN
INTRAMUSCULAR | Status: AC
  Filled 2011-04-16: qty 2

## 2011-04-16 MED ORDER — CEFAZOLIN SODIUM-DEXTROSE 2-3 GM-% IV SOLR: 2.0000 g | INTRAVENOUS | Status: DC

## 2011-04-16 MED ORDER — CEFAZOLIN SODIUM 1-5 GM-% IV SOLN
INTRAVENOUS | Status: DC | PRN
  Administered 2011-04-16: 2 g via INTRAVENOUS

## 2011-04-16 MED ORDER — ONDANSETRON HCL 4 MG/2ML IJ SOLN: 4.0000 mg | Freq: Once | INTRAMUSCULAR | Status: DC | PRN

## 2011-04-16 MED ORDER — MIDAZOLAM HCL 2 MG/2ML IJ SOLN
1.0000 mg | INTRAMUSCULAR | Status: DC | PRN
Start: 2011-04-16 — End: 2011-04-16
  Administered 2011-04-16 (×2): 2 mg via INTRAVENOUS

## 2011-04-16 MED ORDER — FENTANYL CITRATE 0.05 MG/ML IJ SOLN: 25.0000 ug | INTRAMUSCULAR | Status: DC | PRN

## 2011-04-16 MED ORDER — ENOXAPARIN SODIUM 40 MG/0.4ML ~~LOC~~ SOLN
40.0000 mg | Freq: Once | SUBCUTANEOUS | Status: AC
  Administered 2011-04-16: 40 mg via SUBCUTANEOUS

## 2011-04-16 MED ORDER — HEPARIN SOD (PORK) LOCK FLUSH 100 UNIT/ML IV SOLN
INTRAVENOUS | Status: AC
  Filled 2011-04-16: qty 5

## 2011-04-16 MED ORDER — ENOXAPARIN SODIUM 40 MG/0.4ML ~~LOC~~ SOLN
SUBCUTANEOUS | Status: AC
  Filled 2011-04-16: qty 0.4

## 2011-04-16 MED ORDER — HEPARIN SOD (PORK) LOCK FLUSH 100 UNIT/ML IV SOLN
INTRAVENOUS | Status: DC | PRN
  Administered 2011-04-16: 500 [IU] via INTRAVENOUS

## 2011-04-16 MED ORDER — CEFAZOLIN SODIUM 1-5 GM-% IV SOLN
INTRAVENOUS | Status: AC
  Filled 2011-04-16: qty 100

## 2011-04-16 MED ORDER — KETOROLAC TROMETHAMINE 30 MG/ML IJ SOLN
30.0000 mg | Freq: Once | INTRAMUSCULAR | Status: AC
  Administered 2011-04-16: 30 mg via INTRAVENOUS

## 2011-04-16 MED ORDER — LIDOCAINE HCL (PF) 1 % IJ SOLN
INTRAMUSCULAR | Status: DC | PRN
  Administered 2011-04-16: 6 mL via SUBCUTANEOUS

## 2011-04-16 MED ORDER — LACTATED RINGERS IV SOLN
INTRAVENOUS | Status: DC
  Administered 2011-04-16: 08:00:00 via INTRAVENOUS

## 2011-04-16 MED ORDER — PROPOFOL 10 MG/ML IV EMUL
INTRAVENOUS | Status: DC | PRN
  Administered 2011-04-16: 35 ug/kg/min via INTRAVENOUS

## 2011-04-16 SURGICAL SUPPLY — 39 items
APPLIER CLIP 9.375 SM OPEN (CLIP)
BAG DECANTER FOR FLEXI CONT (MISCELLANEOUS) ×2 IMPLANT
BAG HAMPER (MISCELLANEOUS) ×2 IMPLANT
CATH HICKMAN DUAL 12.0 (CATHETERS) IMPLANT
CLIP APPLIE 9.375 SM OPEN (CLIP) IMPLANT
CLOTH BEACON ORANGE TIMEOUT ST (SAFETY) ×2 IMPLANT
COVER LIGHT HANDLE STERIS (MISCELLANEOUS) ×4 IMPLANT
DECANTER SPIKE VIAL GLASS SM (MISCELLANEOUS) IMPLANT
DERMABOND ADVANCED (GAUZE/BANDAGES/DRESSINGS) ×1
DERMABOND ADVANCED .7 DNX12 (GAUZE/BANDAGES/DRESSINGS) ×1 IMPLANT
DRAPE C-ARM FOLDED MOBILE STRL (DRAPES) ×2 IMPLANT
DURAPREP 26ML APPLICATOR (WOUND CARE) ×2 IMPLANT
ELECT REM PT RETURN 9FT ADLT (ELECTROSURGICAL) ×2
ELECTRODE REM PT RTRN 9FT ADLT (ELECTROSURGICAL) ×1 IMPLANT
GLOVE BIO SURGEON STRL SZ7.5 (GLOVE) ×2 IMPLANT
GLOVE BIOGEL PI IND STRL 7.0 (GLOVE) ×2 IMPLANT
GLOVE BIOGEL PI INDICATOR 7.0 (GLOVE) ×2
GLOVE ECLIPSE 6.5 STRL STRAW (GLOVE) ×2 IMPLANT
GLOVE EXAM NITRILE LRG STRL (GLOVE) ×2 IMPLANT
GOWN BRE IMP SLV AUR XL STRL (GOWN DISPOSABLE) ×6 IMPLANT
IV NS 500ML (IV SOLUTION) ×1
IV NS 500ML BAXH (IV SOLUTION) ×1 IMPLANT
KIT PORT POWER 8FR ISP MRI (CATHETERS) ×2 IMPLANT
KIT ROOM TURNOVER APOR (KITS) ×2 IMPLANT
MANIFOLD NEPTUNE II (INSTRUMENTS) ×2 IMPLANT
NEEDLE HYPO 18GX1.5 BLUNT FILL (NEEDLE) ×2 IMPLANT
NEEDLE HYPO 25X1 1.5 SAFETY (NEEDLE) ×2 IMPLANT
PACK MINOR (CUSTOM PROCEDURE TRAY) ×2 IMPLANT
PAD ARMBOARD 7.5X6 YLW CONV (MISCELLANEOUS) ×2 IMPLANT
SET BASIN LINEN APH (SET/KITS/TRAYS/PACK) ×2 IMPLANT
SET INTRODUCER 12FR PACEMAKER (SHEATH) IMPLANT
SHEATH COOK PEEL AWAY SET 8F (SHEATH) IMPLANT
SUT PROLENE 3 0 PS 2 (SUTURE) IMPLANT
SUT VIC AB 3-0 SH 27 (SUTURE) ×1
SUT VIC AB 3-0 SH 27X BRD (SUTURE) ×1 IMPLANT
SUT VIC AB 4-0 PS2 27 (SUTURE) ×2 IMPLANT
SYR 20CC LL (SYRINGE) ×2 IMPLANT
SYR CONTROL 10ML LL (SYRINGE) ×2 IMPLANT
SYRINGE 10CC LL (SYRINGE) ×2 IMPLANT

## 2011-04-16 NOTE — Anesthesia Preprocedure Evaluation (Signed)
Anesthesia Evaluation  Name, MR# and DOB Patient awake  General Assessment Comment  Reviewed: Allergy & Precautions, H&P , NPO status , Patient's Chart, lab work & pertinent test results  History of Anesthesia Complications Negative for: history of anesthetic complications  Airway Mallampati: III TM Distance: >3 FB Neck ROM: Full    Dental  (+) Teeth Intact and Missing   Pulmonary    pulmonary exam normalPulmonary Exam Normal     Cardiovascular hypertension, Pt. on medications Regular Normal    Neuro/Psych    (+) Depression,    GI/Hepatic/Renal   Endo/Other  (+) Hypothyroidism (took synthroid today),      Abdominal   Musculoskeletal   Hematology   Peds  Reproductive/Obstetrics    Anesthesia Other Findings             Anesthesia Physical Anesthesia Plan  ASA: II  Anesthesia Plan: MAC   Post-op Pain Management:    Induction: Intravenous  Airway Management Planned: Simple Face Mask  Additional Equipment:   Intra-op Plan:   Post-operative Plan:   Informed Consent: I have reviewed the patients History and Physical, chart, labs and discussed the procedure including the risks, benefits and alternatives for the proposed anesthesia with the patient or authorized representative who has indicated his/her understanding and acceptance.     Plan Discussed with:   Anesthesia Plan Comments:         Anesthesia Quick Evaluation

## 2011-04-16 NOTE — Progress Notes (Signed)
Portable chest xray completed as ordered, in the pacu.

## 2011-04-16 NOTE — Anesthesia Postprocedure Evaluation (Signed)
  Anesthesia Post-op Note  Patient: Lisa Klein  Procedure(s) Performed:  INSERTION PORT-A-CATH - right subclavian  Patient Location: PACU  Anesthesia Type: MAC  Level of Consciousness: awake and oriented  Airway and Oxygen Therapy: Patient Spontanous Breathing  Post-op Pain: none  Post-op Assessment: Post-op Vital signs reviewed, Patient's Cardiovascular Status Stable and Respiratory Function Stable  Post-op Vital Signs: Reviewed and stable  Complications: No apparent anesthesia complications

## 2011-04-16 NOTE — Op Note (Signed)
Patient:  Lisa Klein  DOB:  23-Nov-1929  MRN:  [id number]   Preop Diagnosis:  Left breast carcinoma  Postop Diagnosis:  Same  Procedure:  Port-A-Cath insertion  Surgeon:  Franky Macho, M.D.  Anes:  MAC  Indications:  Patient is a 77 year old black female who presents for a Port-A-Cath insertion. She is about to undergo chemotherapy for treatment of left breast carcinoma. The risks and benefits of the procedure including bleeding, infection, and pneumothorax were fully explained to the patient, gave informed consent.  Procedure note:  Patient was placed in Trendelenburg position after the right upper chest was prepped and draped using usual sterile technique with DuraPrep. Surgical site confirmation was performed. 1% Xylocaine was used for local anesthesia.  A transverse incision was made below the right clavicle. A subcutaneous pocket was then formed. A needle is advanced into the right subclavian vein using the Seldinger technique without difficulty. A guidewire was then advanced into the right atrium under fluoroscopic guidance. An introducer and peel-away sheath were placed over the guidewire. The catheter was then inserted through the peel-away sheath the peel-away sheath was removed. The catheter was then attached to the port and the port placed in subcutaneous pocket. Adequate positioning was confirmed by fluoroscopy. The port was flushed with 5 cc of heparin flush after adequate positioning was confirmed with good backflow. The subcutaneous layer was reapproximated using 3-0 Vicryl interrupted suture. The skin was closed using a 4 Vicryl subcuticular suture. Dermabond was then applied.  All tape and needle counts were correct at the end of the procedure. The patient was transferred to PACU in stable condition. Chest x-ray be performed.  Complications:  None  EBL:  Minimal  Specimen:  None

## 2011-04-16 NOTE — Transfer of Care (Signed)
Immediate Anesthesia Transfer of Care Note  Patient: Lisa Klein  Procedure(s) Performed:  INSERTION PORT-A-CATH - right subclavian  Patient Location: PACU  Anesthesia Type: MAC  Level of Consciousness: awake, alert  and oriented  Airway & Oxygen Therapy: Patient Spontanous Breathing and Patient connected to nasal cannula oxygen  Post-op Assessment: Report given to PACU RN, Post -op Vital signs reviewed and stable and Patient moving all extremities  Post vital signs: Reviewed and stable  Complications: No apparent anesthesia complications

## 2011-04-16 NOTE — Interval H&P Note (Signed)
History and Physical Interval Note:   04/16/2011   8:39 AM   Lisa Klein  has presented today for surgery, with the diagnosis of Breast ca [174.9]  The various methods of treatment have been discussed with the patient and family. After consideration of risks, benefits and other options for treatment, the patient has consented to  Procedure(s): INSERTION PORT-A-CATH as a surgical intervention .  I have reviewed the patients' chart and labs.  Questions were answered to the patient's satisfaction.     Dalia Heading  MD

## 2011-04-18 ENCOUNTER — Other Ambulatory Visit (HOSPITAL_COMMUNITY): Payer: Self-pay | Admitting: Oncology

## 2011-04-19 ENCOUNTER — Encounter (HOSPITAL_BASED_OUTPATIENT_CLINIC_OR_DEPARTMENT_OTHER): Payer: Medicare HMO

## 2011-04-19 ENCOUNTER — Other Ambulatory Visit (HOSPITAL_COMMUNITY): Payer: Self-pay | Admitting: Oncology

## 2011-04-19 VITALS — BP 134/73 | HR 88 | Temp 96.9°F | Wt 189.0 lb

## 2011-04-19 DIAGNOSIS — C50919 Malignant neoplasm of unspecified site of unspecified female breast: Secondary | ICD-10-CM

## 2011-04-19 DIAGNOSIS — C801 Malignant (primary) neoplasm, unspecified: Secondary | ICD-10-CM

## 2011-04-19 DIAGNOSIS — C773 Secondary and unspecified malignant neoplasm of axilla and upper limb lymph nodes: Secondary | ICD-10-CM

## 2011-04-19 DIAGNOSIS — Z5111 Encounter for antineoplastic chemotherapy: Secondary | ICD-10-CM

## 2011-04-19 LAB — COMPREHENSIVE METABOLIC PANEL
AST: 21 U/L (ref 0–37)
CO2: 27 mEq/L (ref 19–32)
Calcium: 9.5 mg/dL (ref 8.4–10.5)
Creatinine, Ser: 0.66 mg/dL (ref 0.50–1.10)
GFR calc Af Amer: >60 mL/min (ref >60)
GFR calc non Af Amer: >60 mL/min (ref >60)
Glucose, Bld: 98 mg/dL (ref 70–99)
Total Protein: 7.1 g/dL (ref 6.0–8.3)

## 2011-04-19 LAB — CBC
HCT: 39.4 % (ref 36.0–46.0)
MCH: 29.2 pg (ref 26.0–34.0)
MCV: 89.7 fL (ref 78.0–100.0)
RDW: 14.6 % (ref 11.5–15.5)
WBC: 3.6 10*3/uL — ABNORMAL LOW (ref 4.0–10.5)

## 2011-04-19 LAB — DIFFERENTIAL
Basophils Absolute: 0.1 10*3/uL (ref 0.0–0.1)
Eosinophils Absolute: 0.1 10*3/uL (ref 0.0–0.7)
Eosinophils Relative: 3 % (ref 0–5)
Lymphocytes Relative: 34 % (ref 12–46)
Monocytes Absolute: 0.5 10*3/uL (ref 0.1–1.0)

## 2011-04-19 MED ORDER — SODIUM CHLORIDE 0.9 % IV SOLN
Freq: Once | INTRAVENOUS | Status: AC
  Administered 2011-04-19: 10:00:00 via INTRAVENOUS
  Filled 2011-04-19: qty 5

## 2011-04-19 MED ORDER — SODIUM CHLORIDE 0.9 % IJ SOLN
10.0000 mL | INTRAMUSCULAR | Status: DC | PRN
  Filled 2011-04-19: qty 10

## 2011-04-19 MED ORDER — HEPARIN SOD (PORK) LOCK FLUSH 100 UNIT/ML IV SOLN
INTRAVENOUS | Status: AC
  Administered 2011-04-19: 500 [IU]
  Filled 2011-04-19: qty 5

## 2011-04-19 MED ORDER — DEXAMETHASONE SODIUM PHOSPHATE 4 MG/ML IJ SOLN: 12.0000 mg | Freq: Once | INTRAMUSCULAR | Status: DC

## 2011-04-19 MED ORDER — SODIUM CHLORIDE 0.9 % IV SOLN
Freq: Once | INTRAVENOUS | Status: AC
  Administered 2011-04-19: 10:00:00 via INTRAVENOUS

## 2011-04-19 MED ORDER — EPIRUBICIN HCL CHEMO IV INJECTION 200 MG/100ML
90.0000 mg/m2 | Freq: Once | INTRAVENOUS | Status: AC
  Administered 2011-04-19: 174 mg via INTRAVENOUS
  Filled 2011-04-19 (×3): qty 87

## 2011-04-19 MED ORDER — SODIUM CHLORIDE 0.9 % IV SOLN
600.0000 mg/m2 | Freq: Once | INTRAVENOUS | Status: AC
  Administered 2011-04-19: 1160 mg via INTRAVENOUS
  Filled 2011-04-19 (×2): qty 58

## 2011-04-19 MED ORDER — HEPARIN SOD (PORK) LOCK FLUSH 100 UNIT/ML IV SOLN
500.0000 [IU] | Freq: Once | INTRAVENOUS | Status: AC | PRN
  Administered 2011-04-19: 500 [IU]
  Filled 2011-04-19: qty 5

## 2011-04-19 MED ORDER — PALONOSETRON HCL INJECTION 0.25 MG/5ML
0.2500 mg | Freq: Once | INTRAVENOUS | Status: AC
  Administered 2011-04-19: 0.25 mg via INTRAVENOUS
  Filled 2011-04-19: qty 5

## 2011-04-19 MED ORDER — SODIUM CHLORIDE 0.9 % IV SOLN
150.0000 mg | Freq: Once | INTRAVENOUS | Status: DC
  Filled 2011-04-19: qty 5

## 2011-04-20 ENCOUNTER — Encounter (HOSPITAL_COMMUNITY): Payer: Self-pay | Admitting: General Surgery

## 2011-04-20 ENCOUNTER — Other Ambulatory Visit (HOSPITAL_COMMUNITY): Payer: Self-pay | Admitting: Oncology

## 2011-04-20 ENCOUNTER — Encounter (HOSPITAL_BASED_OUTPATIENT_CLINIC_OR_DEPARTMENT_OTHER): Payer: Medicare HMO

## 2011-04-20 DIAGNOSIS — Z170 Estrogen receptor positive status [ER+]: Secondary | ICD-10-CM

## 2011-04-20 DIAGNOSIS — C50919 Malignant neoplasm of unspecified site of unspecified female breast: Secondary | ICD-10-CM

## 2011-04-20 DIAGNOSIS — C801 Malignant (primary) neoplasm, unspecified: Secondary | ICD-10-CM

## 2011-04-20 DIAGNOSIS — C773 Secondary and unspecified malignant neoplasm of axilla and upper limb lymph nodes: Secondary | ICD-10-CM

## 2011-04-20 MED ORDER — PEGFILGRASTIM INJECTION 6 MG/0.6ML
SUBCUTANEOUS | Status: AC
  Filled 2011-04-20: qty 0.6

## 2011-04-20 MED ORDER — PEGFILGRASTIM INJECTION 6 MG/0.6ML
6.0000 mg | Freq: Once | SUBCUTANEOUS | Status: AC
  Administered 2011-04-20: 6 mg via SUBCUTANEOUS

## 2011-04-20 NOTE — Progress Notes (Signed)
Lisa Klein presents today for injection per MD orders. Neulasta 6mg  administered SQ in right Abdomen. Administration without incident. Patient tolerated well.  24 Hour Chemotherapy Follow up   Following administration of Epirubicin and Cytoxan chemotherapy on 9/14 Patient reports nausea and some light-headedness.  Medications reviewed Ativan 1mg  every 3 to 4 hours as needed for nausea or vomiting, Compazine suppositories 25mg  1 per rectum every 6 hours as needed for nausea or vomiting and Decadron as instructed   Following interventions recommended :Take lorazepam as needed and to continue to drink fluids - at least 2 liters per day for the next couple of days.  Reviewed all post chemotherapy instructions with patient and when should call clinic or MD.  Patient verbalized understanding.

## 2011-04-23 ENCOUNTER — Telehealth (HOSPITAL_COMMUNITY): Payer: Self-pay | Admitting: *Deleted

## 2011-04-23 NOTE — Telephone Encounter (Signed)
Pt having abdominal tenderness since Sunday evening. Patient has not had BM in a couple of days. Patient instructed to take MOM and see if she had any results. Daughter called me to let me know that she started vomiting after taking the MOM. NO BM. I called back to patient's house and got Lisa Klein (pt's sister) who is currently looking after patient while daughter is out of house. I let Lisa Klein know that Dr. Mariel Sleet recommended a glycerin suppository for constipation. She verbalized understanding. I also instructed her in taking her nausea medication and to stagger the times of the Compazine, Zofran, and Ativan. I told her to take the Compazine first, then the Zofran, then the Ativan if needed. She verbalized understanding. I also instructed Lisa Klein that if patient wasn't feeling any better by the am i.e, if patient wasn't able to eat or drink, if abd was still tender, or if no BM had occurred to call me or call the cancer center so that we could fit her into Tom's schedule (per Dr. Thornton Papas request). She verbalized understanding of all instructions. I also asked Lisa Klein to let Lisa Klein (pt's daughter) know of the revised instructions.

## 2011-04-24 ENCOUNTER — Encounter (HOSPITAL_BASED_OUTPATIENT_CLINIC_OR_DEPARTMENT_OTHER): Payer: Medicare HMO

## 2011-04-24 ENCOUNTER — Other Ambulatory Visit (HOSPITAL_COMMUNITY): Payer: Self-pay | Admitting: Oncology

## 2011-04-24 DIAGNOSIS — R82998 Other abnormal findings in urine: Secondary | ICD-10-CM

## 2011-04-24 DIAGNOSIS — C50919 Malignant neoplasm of unspecified site of unspecified female breast: Secondary | ICD-10-CM

## 2011-04-24 LAB — BASIC METABOLIC PANEL
BUN: 16 mg/dL (ref 6–23)
Chloride: 98 mEq/L (ref 96–112)
GFR calc Af Amer: >60 mL/min (ref >60)
Potassium: 3.4 mEq/L — ABNORMAL LOW (ref 3.5–5.1)
Sodium: 133 mEq/L — ABNORMAL LOW (ref 135–145)

## 2011-04-24 LAB — DIFFERENTIAL
Basophils Absolute: 0.1 10*3/uL (ref 0.0–0.1)
Basophils Relative: 1 % (ref 0–1)
Eosinophils Absolute: 0 10*3/uL (ref 0.0–0.7)
Lymphocytes Relative: 9 % — ABNORMAL LOW (ref 12–46)
Neutrophils Relative %: 90 % — ABNORMAL HIGH (ref 43–77)

## 2011-04-24 LAB — CBC
HCT: 39 % (ref 36.0–46.0)
MCH: 28.3 pg (ref 26.0–34.0)
MCV: 88.2 fL (ref 78.0–100.0)
RDW: 14.3 % (ref 11.5–15.5)
WBC: 5.6 10*3/uL (ref 4.0–10.5)

## 2011-04-24 NOTE — Progress Notes (Signed)
UA not obtained today because patient cannot void. Urine cup sent home with patient. Patient to return it tomorrow.

## 2011-04-24 NOTE — Progress Notes (Signed)
Labs drawn today for cbc/diff,bmp 

## 2011-04-24 NOTE — Progress Notes (Signed)
I spoke with [name], patient's son, regarding new instructions on taking Potassium. Patient to take Potassium 10 meq tid  X 14 days then return to taking it bid. Grant verbalized understanding.

## 2011-04-24 NOTE — Progress Notes (Signed)
Addended by: Oda Kilts on: 04/24/2011 02:25 PM   Modules accepted: Orders

## 2011-04-25 ENCOUNTER — Other Ambulatory Visit (HOSPITAL_COMMUNITY): Payer: Self-pay | Admitting: *Deleted

## 2011-04-25 DIAGNOSIS — C50919 Malignant neoplasm of unspecified site of unspecified female breast: Secondary | ICD-10-CM

## 2011-04-25 DIAGNOSIS — R82998 Other abnormal findings in urine: Secondary | ICD-10-CM

## 2011-04-25 LAB — URINALYSIS, ROUTINE W REFLEX MICROSCOPIC
Leukocytes, UA: NEGATIVE
Nitrite: NEGATIVE
Specific Gravity, Urine: 1.015 (ref 1.005–1.030)
pH: 7 (ref 5.0–8.0)

## 2011-04-25 LAB — URINE MICROSCOPIC-ADD ON

## 2011-05-02 ENCOUNTER — Encounter (HOSPITAL_BASED_OUTPATIENT_CLINIC_OR_DEPARTMENT_OTHER): Payer: Medicare HMO | Admitting: Oncology

## 2011-05-02 VITALS — BP 148/84 | HR 89 | Temp 97.7°F | Wt 178.8 lb

## 2011-05-02 DIAGNOSIS — C50919 Malignant neoplasm of unspecified site of unspecified female breast: Secondary | ICD-10-CM

## 2011-05-02 DIAGNOSIS — C773 Secondary and unspecified malignant neoplasm of axilla and upper limb lymph nodes: Secondary | ICD-10-CM

## 2011-05-02 LAB — DIFFERENTIAL
Basophils Absolute: 0.1 10*3/uL (ref 0.0–0.1)
Eosinophils Absolute: 0 10*3/uL (ref 0.0–0.7)
Lymphs Abs: 1.1 10*3/uL (ref 0.7–4.0)
Monocytes Absolute: 0.8 10*3/uL (ref 0.1–1.0)

## 2011-05-02 LAB — CBC
MCH: 28.8 pg (ref 26.0–34.0)
MCHC: 32.6 g/dL (ref 30.0–36.0)
MCV: 88.3 fL (ref 78.0–100.0)
Platelets: 228 10*3/uL (ref 150–400)
RDW: 13.6 % (ref 11.5–15.5)

## 2011-05-02 LAB — BASIC METABOLIC PANEL
Calcium: 9.7 mg/dL (ref 8.4–10.5)
GFR calc Af Amer: >60 mL/min (ref >60)
GFR calc non Af Amer: 54 mL/min — ABNORMAL LOW (ref >60)
Potassium: 3.7 mEq/L (ref 3.5–5.1)
Sodium: 138 mEq/L (ref 135–145)

## 2011-05-02 NOTE — Patient Instructions (Signed)
[facility]  Discharge Instructions  RECOMMENDATIONS MADE BY THE CONSULTANT AND ANY TEST RESULTS WILL BE SENT TO YOUR REFERRING DOCTOR.   EXAM FINDINGS BY MD TODAY AND SIGNS AND SYMPTOMS TO REPORT TO CLINIC OR PRIMARY MD:lab work today. Your chemotherapy doses will be decreased by 15% with the next cycle.      SPECIAL INSTRUCTIONS/FOLLOW-UP: Return to clinic as scheduled for chemotherapy and to see MD in 3 weeks.   I acknowledge that I have been informed and understand all the instructions given to me and received a copy. I do not have any more questions at this time, but understand that I may call the [facility] at [facility] at [phone] during business hours should I have any further questions or need assistance in obtaining follow-up care.    __________________________________________  _____________  __________ Signature of Patient or Authorized Representative            Date                   Time    __________________________________________ Nurse's Signature

## 2011-05-02 NOTE — Progress Notes (Signed)
Lisa Overman, MD, MD [address] [place] [address]  1. Breast cancer  CBC, Differential, Basic metabolic panel, Basic metabolic panel, Differential, CBC    CURRENT THERAPY: S/P EC cycle 1.  S/P left modified radical mastectomy on 02/14/11  INTERVAL HISTORY: Lisa Klein 77 y.o. female returns for  regular  visit for followup of   The patient reports that she did well following her first cycles oc chemotherapy, HOWEVER, on the 3 day following therapy, she experience nausea, 3 emeses, and was in bed for 3-4 days.  She lost her appetite and has lost some weight, but she was overweight to begin with.  Her last emesis was on Saturday.  The patient explains that she felt better following her emeses.  Today the patient reports that she feels better.  She denies any nausea or vomiting.  She denies any fevers, chills, night sweats.    She reports that she feel on Monday.  She fell down the outside stairs while she was retrieving her mail from the mailbox.  She has a small excoriation to her left knee that is healing well, and a small, clean skin tear of her right dorsal aspect of hand near her thumb.  The patient repots that she has been eating a lot of gelatin, jello, and applesauce.  Patient education was provided about a healthier diet.  Encouraged increase in ensure intake.    Past Medical History  Diagnosis Date  . DJD (degenerative joint disease)   . Hypertension   . Hypothyroidism   . Obesity   . Degenerative disc disease     with nerve compression   . Hypothyroidism   . Hypertension   . Depression   . Cancer 2012    LEFT BREAST    has HYPOTHYROIDISM; HYPERLIPIDEMIA; OBESITY; HYPERTENSION; DEGENERATIVE JOINT DISEASE; INTERMITTENT VERTIGO; Cancer; and Breast cancer on her problem list.      has no known allergies.  Ms. Hopson had no medications administered during this visit.  Past Surgical History  Procedure Date  . Vesicovaginal fistula closure w/  [place]     [facility]  . Cholecystectomy 80'S    [facility]  . Mastectomy modified radical 02/14/11    left  . Breast surgery     left total mastectomy  . Portacath placement 04/16/2011    Procedure: INSERTION PORT-A-CATH;  Surgeon: Dalia Heading;  Location: [facility];  Service: General;  Laterality: Right;  right subclavian    Denies any headaches, dizziness, double vision, fevers, chills, night sweats, nausea, vomiting, diarrhea, constipation, chest pain, heart palpitations, shortness of breath, blood in stool, black tarry stool, urinary pain, urinary burning, urinary frequency, hematuria.   PHYSICAL EXAMINATION  ECOG PERFORMANCE STATUS: 1 - Symptomatic but completely ambulatory  Filed Vitals:   05/02/11 1148  BP: 148/84  Pulse: 89  Temp: 97.7 F (36.5 C)    GENERAL:alert, well nourished, well developed, comfortable, cooperative and smiling SKIN: skin color, texture, turgor are normal HEAD: Normocephalic EYES: normal EARS: External ears normal OROPHARYNX:mucous membranes are moist  NECK: supple, trachea midline LYMPH:  No epitrochlear nodes noted. BREAST:not examined LUNGS: clear to auscultation and percussion HEART: regular rate & rhythm, no murmurs, no gallops, S1 normal and S2 normal ABDOMEN:abdomen soft, non-tender, obese and normal bowel sounds BACK: Back symmetric, no curvature. EXTREMITIES:less then 2 second capillary refill, no joint deformities, effusion, or inflammation, no edema, no skin discoloration, no clubbing, no cyanosis, small left tibial excoriation just inferior to patella.  This wound is healing and clean. small, clean skin tear of her right dorsal aspect of hand near her thumb. NEURO: alert & oriented x 3 with fluent speech, no focal motor/sensory deficits, gait normal   LABORATORY DATA: CBC    Component Value Date/Time   WBC 7.1 05/02/2011 1316   RBC 4.37 05/02/2011 1316   HGB 12.6 05/02/2011 1316   HCT 38.6 05/02/2011 1316   PLT 228 05/02/2011 1316   MCV 88.3  05/02/2011 1316   MCH 28.8 05/02/2011 1316   MCHC 32.6 05/02/2011 1316   RDW 13.6 05/02/2011 1316   LYMPHSABS PENDING 05/02/2011 1316   MONOABS PENDING 05/02/2011 1316   EOSABS PENDING 05/02/2011 1316   BASOSABS PENDING 05/02/2011 1316      Chemistry      Component Value Date/Time   NA 138 05/02/2011 1316   K 3.7 05/02/2011 1316   CL 101 05/02/2011 1316   CO2 30 05/02/2011 1316   BUN 9 05/02/2011 1316   CREATININE 0.99 05/02/2011 1316   CREATININE 0.86 11/13/2010 1706      Component Value Date/Time   CALCIUM 9.7 05/02/2011 1316   ALKPHOS 57 04/19/2011 0949   AST 21 04/19/2011 0949   ALT 14 04/19/2011 0949   BILITOT 0.6 04/19/2011 0949       ASSESSMENT:  1. Stage IIIA breast cancer, S/P cycle 1 of EC and left modified radical mastectomy on 02/14/11 2. Weight loss, secondary to chemotherapy 3. Nausea, secondary to chemotherapy 4. Vomiting, secondary to chemotherapy    PLAN:  1. 15% dose reduction of chemotherapy 2. Lab work today: CBC diff, BMET 3. I personally reviewed and went over laboratory results with the patient. 4. Return in 3 weeks for follow-up 5. Pre chemo lab as ordered 6. Continue with chemotherapy as scheduled. 7. Encouraged increase in food intake.     All questions were answered. The patient knows to call the clinic with any problems, questions or concerns. We can certainly see the patient much sooner if necessary.  The patient and plan discussed with Glenford Peers, MD and he is in agreement with the aforementioned.  More than 50% of the time spent with the patient was utilized for counseling.   KEFALAS,THOMAS

## 2011-05-07 ENCOUNTER — Other Ambulatory Visit: Payer: Self-pay | Admitting: Family Medicine

## 2011-05-07 DIAGNOSIS — D689 Coagulation defect, unspecified: Secondary | ICD-10-CM

## 2011-05-07 HISTORY — DX: Coagulation defect, unspecified: D68.9

## 2011-05-09 ENCOUNTER — Encounter (HOSPITAL_COMMUNITY): Payer: Medicare HMO | Attending: Oncology

## 2011-05-09 DIAGNOSIS — C50919 Malignant neoplasm of unspecified site of unspecified female breast: Secondary | ICD-10-CM

## 2011-05-09 DIAGNOSIS — C773 Secondary and unspecified malignant neoplasm of axilla and upper limb lymph nodes: Secondary | ICD-10-CM

## 2011-05-09 DIAGNOSIS — M7989 Other specified soft tissue disorders: Secondary | ICD-10-CM | POA: Insufficient documentation

## 2011-05-09 DIAGNOSIS — I82409 Acute embolism and thrombosis of unspecified deep veins of unspecified lower extremity: Secondary | ICD-10-CM | POA: Insufficient documentation

## 2011-05-09 DIAGNOSIS — C801 Malignant (primary) neoplasm, unspecified: Secondary | ICD-10-CM | POA: Insufficient documentation

## 2011-05-09 LAB — COMPREHENSIVE METABOLIC PANEL
ALT: 19 U/L (ref 0–35)
Alkaline Phosphatase: 88 U/L (ref 39–117)
Chloride: 106 mEq/L (ref 96–112)
GFR calc Af Amer: >90 mL/min (ref >90)
GFR calc non Af Amer: 78 mL/min — ABNORMAL LOW (ref >90)
Total Protein: 6.5 g/dL (ref 6.0–8.3)

## 2011-05-09 LAB — CBC
HCT: 36.2 % (ref 36.0–46.0)
MCHC: 32.9 g/dL (ref 30.0–36.0)
RDW: 14.7 % (ref 11.5–15.5)

## 2011-05-09 LAB — DIFFERENTIAL
Basophils Absolute: 0.1 10*3/uL (ref 0.0–0.1)
Basophils Relative: 3 % — ABNORMAL HIGH (ref 0–1)
Monocytes Absolute: 0.7 10*3/uL (ref 0.1–1.0)
Neutro Abs: 2.7 10*3/uL (ref 1.7–7.7)

## 2011-05-09 NOTE — Progress Notes (Signed)
Labs drawn today for cbc,diff,cmp 

## 2011-05-10 ENCOUNTER — Encounter: Payer: Self-pay | Admitting: Family Medicine

## 2011-05-10 ENCOUNTER — Ambulatory Visit (HOSPITAL_COMMUNITY)
Admission: RE | Admit: 2011-05-10 | Discharge: 2011-05-10 | Disposition: A | Discharge status: Home / Self Care | Payer: Medicare HMO | Source: Ambulatory Visit | Attending: Oncology | Admitting: Oncology

## 2011-05-10 ENCOUNTER — Ambulatory Visit (HOSPITAL_BASED_OUTPATIENT_CLINIC_OR_DEPARTMENT_OTHER): Payer: Medicare HMO | Admitting: Oncology

## 2011-05-10 ENCOUNTER — Encounter (HOSPITAL_BASED_OUTPATIENT_CLINIC_OR_DEPARTMENT_OTHER): Payer: Medicare HMO

## 2011-05-10 ENCOUNTER — Other Ambulatory Visit (HOSPITAL_COMMUNITY): Payer: Self-pay | Admitting: Oncology

## 2011-05-10 VITALS — BP 120/69 | HR 67 | Temp 98.3°F | Wt 186.3 lb

## 2011-05-10 DIAGNOSIS — I82409 Acute embolism and thrombosis of unspecified deep veins of unspecified lower extremity: Secondary | ICD-10-CM

## 2011-05-10 DIAGNOSIS — I824Y9 Acute embolism and thrombosis of unspecified deep veins of unspecified proximal lower extremity: Secondary | ICD-10-CM | POA: Insufficient documentation

## 2011-05-10 DIAGNOSIS — C50919 Malignant neoplasm of unspecified site of unspecified female breast: Secondary | ICD-10-CM

## 2011-05-10 DIAGNOSIS — C801 Malignant (primary) neoplasm, unspecified: Secondary | ICD-10-CM

## 2011-05-10 DIAGNOSIS — M7989 Other specified soft tissue disorders: Secondary | ICD-10-CM

## 2011-05-10 DIAGNOSIS — Z5111 Encounter for antineoplastic chemotherapy: Secondary | ICD-10-CM

## 2011-05-10 DIAGNOSIS — C773 Secondary and unspecified malignant neoplasm of axilla and upper limb lymph nodes: Secondary | ICD-10-CM

## 2011-05-10 MED ORDER — ENOXAPARIN SODIUM 150 MG/ML ~~LOC~~ SOLN
130.0000 mg | SUBCUTANEOUS | Status: DC
  Filled 2011-05-10 (×2): qty 1

## 2011-05-10 MED ORDER — SODIUM CHLORIDE 0.9 % IV SOLN
Freq: Once | INTRAVENOUS | Status: AC
  Administered 2011-05-10: 500 mL via INTRAVENOUS

## 2011-05-10 MED ORDER — ENOXAPARIN SODIUM 30 MG/0.3ML ~~LOC~~ SOLN: 1.5000 mg/kg | SUBCUTANEOUS | Status: DC

## 2011-05-10 MED ORDER — EPIRUBICIN HCL CHEMO IV INJECTION 200 MG/100ML
76.5000 mg/m2 | Freq: Once | INTRAVENOUS | Status: AC
  Administered 2011-05-10 (×2): 148 mg via INTRAVENOUS
  Filled 2011-05-10 (×3): qty 74

## 2011-05-10 MED ORDER — PALONOSETRON HCL INJECTION 0.25 MG/5ML
0.2500 mg | Freq: Once | INTRAVENOUS | Status: AC
  Administered 2011-05-10: 0.25 mg via INTRAVENOUS
  Filled 2011-05-10: qty 5

## 2011-05-10 MED ORDER — SODIUM CHLORIDE 0.9 % IJ SOLN
10.0000 mL | INTRAMUSCULAR | Status: DC | PRN
  Administered 2011-05-10: 10 mL
  Filled 2011-05-10: qty 10

## 2011-05-10 MED ORDER — WARFARIN SODIUM 5 MG PO TABS: 5.0000 mg | ORAL_TABLET | Freq: Every day | ORAL | Status: DC

## 2011-05-10 MED ORDER — SODIUM CHLORIDE 0.9 % IV SOLN
510.0000 mg/m2 | Freq: Once | INTRAVENOUS | Status: AC
  Administered 2011-05-10: 980 mg via INTRAVENOUS
  Filled 2011-05-10 (×2): qty 49

## 2011-05-10 MED ORDER — SODIUM CHLORIDE 0.9 % IV SOLN
150.0000 mg | Freq: Once | INTRAVENOUS | Status: AC
  Administered 2011-05-10: 150 mg via INTRAVENOUS
  Filled 2011-05-10: qty 5

## 2011-05-10 MED ORDER — DEXAMETHASONE SODIUM PHOSPHATE 4 MG/ML IJ SOLN: 12.0000 mg | Freq: Once | INTRAMUSCULAR | Status: DC

## 2011-05-10 MED ORDER — HEPARIN SOD (PORK) LOCK FLUSH 100 UNIT/ML IV SOLN
500.0000 [IU] | Freq: Once | INTRAVENOUS | Status: AC | PRN
  Administered 2011-05-10: 500 [IU]
  Filled 2011-05-10: qty 5

## 2011-05-10 MED ORDER — ENOXAPARIN SODIUM 150 MG/ML ~~LOC~~ SOLN
130.0000 mg | Freq: Once | SUBCUTANEOUS | Status: AC
  Administered 2011-05-10: 130 mg via SUBCUTANEOUS

## 2011-05-10 MED ORDER — HEPARIN SOD (PORK) LOCK FLUSH 100 UNIT/ML IV SOLN
INTRAVENOUS | Status: AC
  Administered 2011-05-10: 500 [IU]
  Filled 2011-05-10: qty 5

## 2011-05-10 NOTE — Progress Notes (Signed)
Presents for chemotherapy.  Complains with swelling right leg.  Just noticed swelling this am.  Did experience some discomfort entire lateral right leg last night.  States" I just rubbed it last night and I went to sleep.  Not hurting any this morning."  PA notified.  1200 Lisa Klein presents today for injection per MD orders. Lovenox 130 mg administered SQ in left Abdomen. Administration without incident. Patient tolerated well.  Daughter instructed regarding administration.  Knows to call or come by if any problems with obtaining lovenox or giving injections.  [facility] Discharge Instructions for Patients Receiving Chemotherapy  Today you received the following chemotherapy agents Epirubicin & cytoxan  To help prevent nausea and vomiting after your treatment, we encourage you to take your nausea medication : Decadron (desamethasone) 4 mg beginning tomorrow take 2 tablets on Saturday take 2 tablets 2 times daily for 2 days Lorazepam 1 mg :  Can take 1 pill every 3 hours as needed for nausea ( will make you sleepy) Ondansetron 8 mg :  Beginning the 3rd day after chemo you can take 1 pil 2 times daily. Compazine Suppository:  Can use a suppository every 6 hours as needed for nausea. Begin taking it at as directed and take it as often as prescribed for as long as needed. Drink at least 2 liters of fluids per day for the next 3 days.   If you develop nausea and vomiting that is not controlled by your nausea medication, call the clinic. If it is after clinic hours your family physician or the after hours number for the clinic or go to the Emergency Department.   BELOW ARE SYMPTOMS THAT SHOULD BE REPORTED IMMEDIATELY:  *FEVER GREATER THAN 101.0 F  *CHILLS WITH OR WITHOUT FEVER  NAUSEA AND VOMITING THAT IS NOT CONTROLLED WITH YOUR NAUSEA MEDICATION  *UNUSUAL SHORTNESS OF BREATH  *UNUSUAL BRUISING OR BLEEDING  TENDERNESS IN MOUTH AND THROAT WITH OR WITHOUT PRESENCE  OF ULCERS  *URINARY PROBLEMS  *BOWEL PROBLEMS  UNUSUAL RASH Items with * indicate a potential emergency and should be followed up as soon as possible.  One of the nurses will contact you 24 hours after your treatment. Please let the nurse know about any problems that you may have experienced. Feel free to call the clinic you have any questions or concerns. The clinic phone number is [phone].   I have been informed and understand all the instructions given to me. I know to contact the clinic, my physician, or go to the Emergency Department if any problems should occur. I do not have any questions at this time, but understand that I may call the clinic during office hours or the Patient Navigator at [phone] should I have any questions or need assistance in obtaining follow up care.    __________________________________________  _____________  __________ Signature of Patient or Authorized Representative            Date                   Time    __________________________________________ Nurse's Signature

## 2011-05-10 NOTE — Progress Notes (Signed)
S:  The patient was seen in the chemotherapy treatment room.  She reports right leg pain last night which has resolved.  She explains that it was a sudden onset.  She explains that she rubbed the area and then fell asleep for the night.  It is slightly edematous today.  She denies any pain or discomfort of the leg today.  The pain was located on the lateral aspect of the right calf.   O: Right LE trace to 1+ pitting edema.  It is not tender to the touch in the calf, or popliteal fossa.  No heat to the touch.  No erythema noted.    A:  1. Right LE DVT  P: 1. [place] of right LE extremity performed. IMPRESSION:  Acute deep venous thrombosis involving the right popliteal and  proximal calf veins with additional superficial thrombophlebitis at  the lesser saphenous vein.  Findings called by Milon Dikes to Lenise Herald PA on 05/10/2011 at  1139 hours.  Original Report Authenticated By: Lollie Marrow, M.D.  2. Will initiate anticoagulation. 3. Lovenox 1.5 mg/kg today in the clinic and then daily. 4. Coumadin 5 mg today and then daily. 5. PT/INR on Monday 6. Follow-up as scheduled

## 2011-05-10 NOTE — Progress Notes (Signed)
S:  The patient was seen in the chemotherapy treatment room. She reports right leg pain last night which has resolved. She explains that it was a sudden onset. She explains that she rubbed the area and then fell asleep for the night. It is slightly edematous today. She denies any pain or discomfort of the leg today. The pain was located on the lateral aspect of the right calf.  O: Right LE trace to 1+ pitting edema. It is not tender to the touch in the calf, or popliteal fossa. No heat to the touch. No erythema noted.  A:  1. Right LE DVT  P:  1. [place] of right LE extremity performed. IMPRESSION:  Acute deep venous thrombosis involving the right popliteal and  proximal calf veins with additional superficial thrombophlebitis at  the lesser saphenous vein.  Findings called by Milon Dikes to Lenise Herald PA on 05/10/2011 at  1139 hours.  Original Report Authenticated By: Lollie Marrow, M.D.  2. Will initiate anticoagulation.  3. Lovenox 1.5 mg/kg today in the clinic and then daily.  4. Coumadin 5 mg today and then daily.  5. PT/INR on Monday 6. Follow-up as scheduled 7. I personally reviewed and went over radiographic studies with the patient. 8. Patient education regarding anticoagulation, Lovenox, Coumadin, PT/INRs, Vitamin K, diet, etc.  9. More than 50% of the time spent with the patient was utilized for counseling.

## 2011-05-11 ENCOUNTER — Encounter (HOSPITAL_BASED_OUTPATIENT_CLINIC_OR_DEPARTMENT_OTHER): Payer: Medicare HMO

## 2011-05-11 DIAGNOSIS — C801 Malignant (primary) neoplasm, unspecified: Secondary | ICD-10-CM

## 2011-05-11 DIAGNOSIS — C773 Secondary and unspecified malignant neoplasm of axilla and upper limb lymph nodes: Secondary | ICD-10-CM

## 2011-05-11 DIAGNOSIS — C50919 Malignant neoplasm of unspecified site of unspecified female breast: Secondary | ICD-10-CM

## 2011-05-11 MED ORDER — PEGFILGRASTIM INJECTION 6 MG/0.6ML
6.0000 mg | Freq: Once | SUBCUTANEOUS | Status: AC
  Administered 2011-05-11: 6 mg via SUBCUTANEOUS

## 2011-05-11 MED ORDER — PEGFILGRASTIM INJECTION 6 MG/0.6ML
SUBCUTANEOUS | Status: AC
  Filled 2011-05-11: qty 0.6

## 2011-05-14 ENCOUNTER — Telehealth (HOSPITAL_COMMUNITY): Payer: Self-pay

## 2011-05-14 ENCOUNTER — Encounter (HOSPITAL_BASED_OUTPATIENT_CLINIC_OR_DEPARTMENT_OTHER): Payer: Medicare HMO

## 2011-05-14 DIAGNOSIS — I82409 Acute embolism and thrombosis of unspecified deep veins of unspecified lower extremity: Secondary | ICD-10-CM

## 2011-05-14 NOTE — Telephone Encounter (Signed)
Call from daughter to check on PT/INR.  Results discussed with Dr. Mariel Sleet.  To continue Lovenox injections and to increase coumadin to 6 mg daily.  To return to clinic on Friday for repeat level.  Rx for Coumadin 1 mg called to [facility].

## 2011-05-14 NOTE — Progress Notes (Signed)
Labs drawn today for pt.  Patient on lovenox qd and does not know amount of coud taking. Call patient at [phone]

## 2011-05-16 ENCOUNTER — Ambulatory Visit (INDEPENDENT_AMBULATORY_CARE_PROVIDER_SITE_OTHER): Payer: Medicare HMO | Admitting: Family Medicine

## 2011-05-16 ENCOUNTER — Encounter: Payer: Self-pay | Admitting: Family Medicine

## 2011-05-16 VITALS — BP 120/70 | HR 91 | Resp 16 | Ht 66.0 in | Wt 184.1 lb

## 2011-05-16 DIAGNOSIS — I82409 Acute embolism and thrombosis of unspecified deep veins of unspecified lower extremity: Secondary | ICD-10-CM

## 2011-05-16 DIAGNOSIS — C50919 Malignant neoplasm of unspecified site of unspecified female breast: Secondary | ICD-10-CM

## 2011-05-16 DIAGNOSIS — E785 Hyperlipidemia, unspecified: Secondary | ICD-10-CM

## 2011-05-16 DIAGNOSIS — I10 Essential (primary) hypertension: Secondary | ICD-10-CM

## 2011-05-16 DIAGNOSIS — E039 Hypothyroidism, unspecified: Secondary | ICD-10-CM

## 2011-05-16 NOTE — Patient Instructions (Addendum)
F/u in 4.5 months.  No med changes.  Please ensure you eat regularly and take supplements like ensure to keep up your strength.  I hope you continue to manage to take your treatments well

## 2011-05-17 ENCOUNTER — Telehealth (HOSPITAL_COMMUNITY): Payer: Self-pay | Admitting: *Deleted

## 2011-05-17 ENCOUNTER — Encounter (HOSPITAL_BASED_OUTPATIENT_CLINIC_OR_DEPARTMENT_OTHER): Payer: Medicare HMO | Admitting: Oncology

## 2011-05-17 ENCOUNTER — Encounter (HOSPITAL_COMMUNITY): Payer: Self-pay | Admitting: Oncology

## 2011-05-17 VITALS — BP 115/75 | HR 73 | Temp 97.3°F | Wt 182.4 lb

## 2011-05-17 DIAGNOSIS — Z170 Estrogen receptor positive status [ER+]: Secondary | ICD-10-CM

## 2011-05-17 DIAGNOSIS — C50919 Malignant neoplasm of unspecified site of unspecified female breast: Secondary | ICD-10-CM

## 2011-05-17 DIAGNOSIS — I82409 Acute embolism and thrombosis of unspecified deep veins of unspecified lower extremity: Secondary | ICD-10-CM

## 2011-05-17 DIAGNOSIS — C773 Secondary and unspecified malignant neoplasm of axilla and upper limb lymph nodes: Secondary | ICD-10-CM

## 2011-05-17 MED ORDER — ENOXAPARIN SODIUM 30 MG/0.3ML ~~LOC~~ SOLN: 1.5000 mg/kg | SUBCUTANEOUS | Status: DC

## 2011-05-17 NOTE — Telephone Encounter (Signed)
Pt's daughter notified of change in Coumadin dose to 7mg . Patient to continue Lovenox. Refill on Lovenox x 14 days given. Patient to return on Monday October 15 for PT/INR.

## 2011-05-17 NOTE — Progress Notes (Signed)
Lisa Overman, MD, MD [address] [place] [address]  1. DVT (deep venous thrombosis)  Protime-INR, enoxaparin (LOVENOX) 30 MG/0.3ML SOLN, Protime-INR, Protime-INR  2. Breast cancer      CURRENT THERAPY: Status post 2 cycles of EC given every 21 days. She underwent a 15% dose reduction for cycle 2.  INTERVAL HISTORY: Lisa Klein 77 y.o. female returns for  regular  visit for followup of Breast cancer  The patient reports that cycle 2 of chemotherapy went much better than her first cycle following the 15% dose reduction. She said for approximately 4 days following chemotherapy she felt fatigued. She denies any nausea or vomiting. She reports that she is able to do her housework. She is much pleased with the way she has felt for this cycle of chemotherapy.  The patient was recently diagnosed with a right lower extremity DVT. She remains on Lovenox injections while we titrate her Coumadin dose to a therapeutic INR. The patient is looking forward to completing her Lovenox injections. She reports that her right lower extremity edema has resolved. She denies any lower extremity discomfort or pain.  The patient reports that her appetite is reasonable. She slated she is eating much better than she was on her last visit. Her appetite has improved slightly.   Past Medical History  Diagnosis Date  . DJD (degenerative joint disease)   . Hypertension   . Hypothyroidism   . Obesity   . Degenerative disc disease     with nerve compression   . Hypothyroidism   . Hypertension   . Depression   . Cancer 2012    LEFT BREAST    has HYPOTHYROIDISM; HYPERLIPIDEMIA; OBESITY; HYPERTENSION; DEGENERATIVE JOINT DISEASE; INTERMITTENT VERTIGO; Cancer; Breast cancer; and DVT (deep venous thrombosis) on her problem list.      has no known allergies.  Ms. Klooster had no medications administered during this visit.  Past Surgical History  Procedure Date  . Vesicovaginal fistula  closure w/ tah     [facility]  . Cholecystectomy 80'S    [facility]  . Mastectomy modified radical 02/14/11    left  . Breast surgery     left total mastectomy  . Portacath placement 04/16/2011    Procedure: INSERTION PORT-A-CATH;  Surgeon: Dalia Heading;  Location: [facility];  Service: General;  Laterality: Right;  right subclavian  . Cholecystectomy     Denies any headaches, dizziness, double vision, fevers, chills, night sweats, nausea, vomiting, diarrhea, constipation, chest pain, heart palpitations, shortness of breath, blood in stool, black tarry stool, urinary pain, urinary burning, urinary frequency, hematuria.   PHYSICAL EXAMINATION  ECOG PERFORMANCE STATUS: 1 - Symptomatic but completely ambulatory  Filed Vitals:   05/17/11 1152  BP: 115/75  Pulse: 73  Temp: 97.3 F (36.3 C)    GENERAL:alert, no distress, well nourished, well developed, comfortable, cooperative and smiling SKIN: skin color, texture, turgor are normal HEAD: Normocephalic EYES: normal EARS: External ears normal OROPHARYNX:mucous membranes are moist  NECK: trachea midline LYMPH:  not examined BREAST:not examined LUNGS: clear to auscultation and percussion HEART: regular rate & rhythm, no murmurs, no gallops, S1 normal and S2 normal ABDOMEN:abdomen soft, non-tender and normal bowel sounds BACK: Back symmetric, no curvature., No CVA tenderness EXTREMITIES:less then 2 second capillary refill, no joint deformities, effusion, or inflammation, no skin discoloration, no clubbing, no cyanosis, positive findings:  edema trace right LE pitting edema  NEURO: alert & oriented x 3 with fluent speech, no focal motor/sensory  deficits, gait normal   LABORATORY DATA: CBC    Component Value Date/Time   WBC 4.5 05/09/2011 0900   RBC 4.07 05/09/2011 0900   HGB 11.9* 05/09/2011 0900   HCT 36.2 05/09/2011 0900   PLT 321 05/09/2011 0900   MCV 88.9 05/09/2011 0900   MCH 29.2 05/09/2011 0900   MCHC 32.9 05/09/2011 0900   RDW 14.7  05/09/2011 0900   LYMPHSABS 0.9 05/09/2011 0900   MONOABS 0.7 05/09/2011 0900   EOSABS 0.0 05/09/2011 0900   BASOSABS 0.1 05/09/2011 0900    Lab Results  Component Value Date   INR 1.14 05/17/2011   INR 1.30 05/14/2011     PATHOLOGY: 1. Breast, modified radical mastectomy, left-multifocal invasive ductal carcinoma (4 foci), 1.5 cm largest focus, grade 3. Involvement of the deep surgical margin of resection by carcinoma. Metastatic carcinoma in 6/7 left axillary lymph nodes. LVI was identified. Estrogen receptor +100% progesterone receptor +14% Ki-67 marker 45% HER-2 negative.    ASSESSMENT:  1. Stage IIIa breast cancer 2. Weight loss, secondary to chemotherapy, improved 3. Nausea, secondary to chemotherapy, improved  4. Vomiting secondary to chemotherapy, resolved.   PLAN:  1. Lab work today: PT/INR 2. Cancel PT/INR lab appointment scheduled for tomorrow. 3. E-scribed Lovenox refill to [facility].  She will continue daily 1.5 mg/kg injections until therapeutic INR. 4. increase Coumadin dose to 7 mg daily per most recent INR level. 5. Return in 3 weeks for follow-up. 6. 2D echo in 1st or 2nd week of December   All questions were answered. The patient knows to call the clinic with any problems, questions or concerns. We can certainly see the patient much sooner if necessary.   KEFALAS,THOMAS

## 2011-05-17 NOTE — Patient Instructions (Signed)
[facility]  Discharge Instructions  RECOMMENDATIONS MADE BY THE CONSULTANT AND ANY TEST RESULTS WILL BE SENT TO YOUR REFERRING DOCTOR.   EXAM FINDINGS BY MD TODAY AND SIGNS AND SYMPTOMS TO REPORT TO CLINIC OR PRIMARY MD:   PT/INR today.  We will call you with results today to let you know how to continue taking your Coumadin.  Lovenox refills called in.   Exam great!   Return on Tuesday June 05, 2011 @ 12:30 to see Dr. Mariel Sleet  2D Echo to be scheduled in December after Cycle IV.        I acknowledge that I have been informed and understand all the instructions given to me and received a copy. I do not have any more questions at this time, but understand that I may call the Specialty Clinic at [facility] at [phone] during business hours should I have any further questions or need assistance in obtaining follow-up care.    __________________________________________  _____________  __________ Signature of Patient or Authorized Representative            Date                   Time    __________________________________________ Nurse's Signature

## 2011-05-18 ENCOUNTER — Other Ambulatory Visit (HOSPITAL_COMMUNITY): Payer: Medicare HMO

## 2011-05-20 ENCOUNTER — Encounter: Payer: Self-pay | Admitting: Family Medicine

## 2011-05-20 NOTE — Progress Notes (Signed)
  Subjective:    Patient ID: Lisa Klein, female    DOB: 1929/12/07, 77 y.o.   MRN: [id number]  HPI The PT is here for follow up and re-evaluation of chronic medical conditions, medication management and review of any available recent lab and radiology data.  Preventive health is updated, specifically  Cancer screening and Immunization.   Since her last visit, pt has been diagnosed with breast cancer for which she is currently being treated with chemo following  Mastectomy. She unfortunately recently developed a DVT and is being anti coagulated for this   Review of Systems See HPI Denies recent fever or chills.Reports fatigue and poor apetitie, as expected on treatment Denies sinus pressure, nasal congestion, ear pain or sore throat. Denies chest congestion, productive cough or wheezing. Denies chest pains, palpitations and leg swelling Denies abdominal pain, nausea, vomiting,diarrhea or constipation.   Denies dysuria, frequency, hesitancy or incontinence. Denies joint pain, swelling and limitation in mobility. Denies headaches, seizures, numbness, or tingling. Mild  Depression,denies  anxiety or insomnia. Denies skin break down or rash.        Objective:   Physical Exam Patient alert and oriented and in no cardiopulmonary distress.Ill appearing  HEENT: No facial asymmetry, EOMI, no sinus tenderness,  oropharynx pink and moist.  Neck supple no adenopathy.  Chest: Clear to auscultation bilaterally. Breast: left mastectomy, excellent healing of surgical scar CVS: S1, S2 no murmurs, no S3.  ABD: Soft non tender. Bowel sounds normal.  Ext: No edema  MS: Adequate ROM spine, shoulders, hips and knees.  Skin: Intact, no ulcerations or rash noted.  Psych: Good eye contact, normal affect. Memory intact mildly depressed appearing.  CNS: CN 2-12 intact, power, tone and sensation normal throughout.        Assessment & Plan:

## 2011-05-20 NOTE — Assessment & Plan Note (Signed)
S/p left  mastectomy, and has completed 2 courses of chemo to date, reports anorexia and fatigue with same

## 2011-05-20 NOTE — Assessment & Plan Note (Signed)
Recent dx anti coagullation is through the coumadin clinic

## 2011-05-20 NOTE — Assessment & Plan Note (Signed)
Controlled, no change in medication  

## 2011-05-20 NOTE — Assessment & Plan Note (Signed)
Hyperlipidemia:Low fat diet discussed and encouraged.  U[pdated lab at next visit

## 2011-05-20 NOTE — Assessment & Plan Note (Signed)
Needs updated lab at next visit

## 2011-05-21 ENCOUNTER — Other Ambulatory Visit (HOSPITAL_COMMUNITY): Payer: Self-pay | Admitting: Oncology

## 2011-05-21 ENCOUNTER — Other Ambulatory Visit (HOSPITAL_COMMUNITY): Payer: Medicare HMO

## 2011-05-21 ENCOUNTER — Encounter (HOSPITAL_BASED_OUTPATIENT_CLINIC_OR_DEPARTMENT_OTHER): Payer: Medicare HMO

## 2011-05-21 DIAGNOSIS — I82409 Acute embolism and thrombosis of unspecified deep veins of unspecified lower extremity: Secondary | ICD-10-CM

## 2011-05-21 LAB — PROTIME-INR: Prothrombin Time: 16.3 seconds — ABNORMAL HIGH (ref 11.6–15.2)

## 2011-05-21 NOTE — Progress Notes (Signed)
Stay on Lovenox  Increase Coumadin to 8 mg daily  PT/INR on Thursday  per Jenita Seashore PA-C. This was verbalized to patient's daughter and she will let pt know how to take Coumadin. Daughter states that they should have enough coumadin tablets to get them through. Appt scheduled for 1130 on 10/18 for inr.

## 2011-05-21 NOTE — Progress Notes (Signed)
Labs drawn today for pt.  Patient on 7mg  of coud and lovenox shots.  Call patient at [phone] ( ask for [facility])

## 2011-05-24 ENCOUNTER — Encounter (HOSPITAL_COMMUNITY): Payer: Medicare HMO

## 2011-05-24 ENCOUNTER — Encounter (HOSPITAL_BASED_OUTPATIENT_CLINIC_OR_DEPARTMENT_OTHER): Payer: Medicare HMO

## 2011-05-24 ENCOUNTER — Other Ambulatory Visit (HOSPITAL_COMMUNITY): Payer: Self-pay | Admitting: Oncology

## 2011-05-24 DIAGNOSIS — I82409 Acute embolism and thrombosis of unspecified deep veins of unspecified lower extremity: Secondary | ICD-10-CM

## 2011-05-24 LAB — PROTIME-INR: INR: 1.46 (ref 0.00–1.49)

## 2011-05-24 NOTE — Progress Notes (Signed)
Labs drawn today for pt.  Patient on 8mg  a day and lovenox qd. Call patient at [phone]

## 2011-05-25 ENCOUNTER — Telehealth (HOSPITAL_COMMUNITY): Payer: Self-pay | Admitting: *Deleted

## 2011-05-25 ENCOUNTER — Encounter (HOSPITAL_BASED_OUTPATIENT_CLINIC_OR_DEPARTMENT_OTHER): Payer: Medicare HMO

## 2011-05-25 ENCOUNTER — Other Ambulatory Visit (HOSPITAL_COMMUNITY): Payer: Self-pay | Admitting: Oncology

## 2011-05-25 DIAGNOSIS — I82409 Acute embolism and thrombosis of unspecified deep veins of unspecified lower extremity: Secondary | ICD-10-CM

## 2011-05-25 MED ORDER — ENOXAPARIN SODIUM 120 MG/0.8ML ~~LOC~~ SOLN: 1.5000 mg/kg | SUBCUTANEOUS | Status: DC

## 2011-05-25 MED ORDER — ENOXAPARIN SODIUM 150 MG/ML ~~LOC~~ SOLN
125.0000 mg | SUBCUTANEOUS | Status: DC
  Administered 2011-05-25: 125 mg via SUBCUTANEOUS
  Filled 2011-05-25: qty 1

## 2011-05-25 MED ORDER — ENOXAPARIN SODIUM 150 MG/ML ~~LOC~~ SOLN
1.5000 mg/kg | SUBCUTANEOUS | Status: DC
  Filled 2011-05-25 (×2): qty 1

## 2011-05-25 NOTE — Telephone Encounter (Signed)
Pt notified on 10/18 to increase coumadin to 9 mg daily and to continue lovenox. She wishes to come to clinic for lovenox. Appt. Made for 10/19 930 to begin this.

## 2011-05-25 NOTE — Progress Notes (Signed)
Lovenox 125mg  subq rt lower abdomen. Tolerated well.

## 2011-05-26 ENCOUNTER — Encounter (HOSPITAL_BASED_OUTPATIENT_CLINIC_OR_DEPARTMENT_OTHER): Payer: Medicare HMO

## 2011-05-26 DIAGNOSIS — I82409 Acute embolism and thrombosis of unspecified deep veins of unspecified lower extremity: Secondary | ICD-10-CM

## 2011-05-26 DIAGNOSIS — C50919 Malignant neoplasm of unspecified site of unspecified female breast: Secondary | ICD-10-CM

## 2011-05-26 MED ORDER — ENOXAPARIN SODIUM 120 MG/0.8ML ~~LOC~~ SOLN
125.0000 mg | Freq: Once | SUBCUTANEOUS | Status: AC
  Administered 2011-05-26: 125 mg via SUBCUTANEOUS

## 2011-05-26 NOTE — Progress Notes (Signed)
Marquis Buggy presents today for injection per MD orders. lovenox 125 mg administered SQ in right Abdomen. Administration without incident. Patient tolerated well.

## 2011-05-27 ENCOUNTER — Encounter (HOSPITAL_BASED_OUTPATIENT_CLINIC_OR_DEPARTMENT_OTHER): Payer: Medicare HMO

## 2011-05-27 DIAGNOSIS — I82409 Acute embolism and thrombosis of unspecified deep veins of unspecified lower extremity: Secondary | ICD-10-CM

## 2011-05-27 DIAGNOSIS — C50919 Malignant neoplasm of unspecified site of unspecified female breast: Secondary | ICD-10-CM

## 2011-05-27 MED ORDER — ENOXAPARIN SODIUM 120 MG/0.8ML ~~LOC~~ SOLN
125.0000 mg | SUBCUTANEOUS | Status: DC
  Administered 2011-05-27: 125 mg via SUBCUTANEOUS

## 2011-05-27 NOTE — Progress Notes (Signed)
Charniece G Hintz presents today for injection per MD orders. lovenox 125 mg administered SQ in right Abdomen. Administration without incident. Patient tolerated well.  

## 2011-05-28 ENCOUNTER — Encounter (HOSPITAL_BASED_OUTPATIENT_CLINIC_OR_DEPARTMENT_OTHER): Payer: Medicare HMO

## 2011-05-28 DIAGNOSIS — I82409 Acute embolism and thrombosis of unspecified deep veins of unspecified lower extremity: Secondary | ICD-10-CM

## 2011-05-28 DIAGNOSIS — C50919 Malignant neoplasm of unspecified site of unspecified female breast: Secondary | ICD-10-CM

## 2011-05-28 MED ORDER — ENOXAPARIN SODIUM 150 MG/ML ~~LOC~~ SOLN
125.0000 mg | Freq: Once | SUBCUTANEOUS | Status: AC
  Administered 2011-05-28: 125 mg via SUBCUTANEOUS

## 2011-05-28 NOTE — Progress Notes (Signed)
Marquis Buggy presents today for injection per MD orders. lovenox 125 mg administered SQ in left Abdomen. Administration without incident. Patient tolerated well.

## 2011-05-29 ENCOUNTER — Encounter (HOSPITAL_COMMUNITY): Payer: Medicare HMO

## 2011-05-29 ENCOUNTER — Other Ambulatory Visit (HOSPITAL_COMMUNITY): Payer: Self-pay | Admitting: Oncology

## 2011-05-29 ENCOUNTER — Encounter (HOSPITAL_BASED_OUTPATIENT_CLINIC_OR_DEPARTMENT_OTHER): Payer: Medicare HMO

## 2011-05-29 DIAGNOSIS — C50919 Malignant neoplasm of unspecified site of unspecified female breast: Secondary | ICD-10-CM

## 2011-05-29 DIAGNOSIS — I82409 Acute embolism and thrombosis of unspecified deep veins of unspecified lower extremity: Secondary | ICD-10-CM

## 2011-05-29 LAB — COMPREHENSIVE METABOLIC PANEL
ALT: 22 U/L (ref 0–35)
AST: 22 U/L (ref 0–37)
Albumin: 3.5 g/dL (ref 3.5–5.2)
Chloride: 103 mEq/L (ref 96–112)
Creatinine, Ser: 0.94 mg/dL (ref 0.50–1.10)
Potassium: 4 mEq/L (ref 3.5–5.1)
Sodium: 140 mEq/L (ref 135–145)
Total Bilirubin: 0.2 mg/dL — ABNORMAL LOW (ref 0.3–1.2)

## 2011-05-29 LAB — DIFFERENTIAL
Basophils Absolute: 0.1 10*3/uL (ref 0.0–0.1)
Basophils Relative: 2 % — ABNORMAL HIGH (ref 0–1)
Monocytes Absolute: 0.8 10*3/uL (ref 0.1–1.0)
Neutro Abs: 2.2 10*3/uL (ref 1.7–7.7)
Neutrophils Relative %: 60 % (ref 43–77)

## 2011-05-29 LAB — CBC
MCHC: 32.8 g/dL (ref 30.0–36.0)
Platelets: 184 10*3/uL (ref 150–400)
RDW: 16 % — ABNORMAL HIGH (ref 11.5–15.5)
WBC: 3.7 10*3/uL — ABNORMAL LOW (ref 4.0–10.5)

## 2011-05-29 LAB — PROTIME-INR
INR: 1.97 — ABNORMAL HIGH (ref 0.00–1.49)
Prothrombin Time: 22.8 seconds — ABNORMAL HIGH (ref 11.6–15.2)

## 2011-05-29 MED ORDER — ENOXAPARIN SODIUM 150 MG/ML ~~LOC~~ SOLN
125.0000 mg | Freq: Once | SUBCUTANEOUS | Status: AC
  Administered 2011-05-29: 125 mg via SUBCUTANEOUS
  Filled 2011-05-29: qty 1

## 2011-05-29 NOTE — Progress Notes (Signed)
Marquis Buggy presents today for injection per MD orders.  lovenox 125 mg administered SQ in right lower abd. Administration without incident. Patient tolerated well.

## 2011-05-29 NOTE — Progress Notes (Signed)
Labs drawn today for cbc/diff,cmp, pt.  Patient on 9mg  qd and lovenox shot.  Call patient [phone]

## 2011-05-30 ENCOUNTER — Encounter (HOSPITAL_BASED_OUTPATIENT_CLINIC_OR_DEPARTMENT_OTHER): Payer: Medicare HMO

## 2011-05-30 ENCOUNTER — Telehealth (HOSPITAL_COMMUNITY): Payer: Self-pay | Admitting: *Deleted

## 2011-05-30 VITALS — BP 117/78 | HR 67

## 2011-05-30 DIAGNOSIS — I82409 Acute embolism and thrombosis of unspecified deep veins of unspecified lower extremity: Secondary | ICD-10-CM

## 2011-05-30 MED ORDER — ENOXAPARIN SODIUM 150 MG/ML ~~LOC~~ SOLN
125.0000 mg | SUBCUTANEOUS | Status: DC
  Administered 2011-05-30: 125 mg via SUBCUTANEOUS
  Filled 2011-05-30 (×2): qty 1

## 2011-05-30 NOTE — Telephone Encounter (Signed)
Message copied by Dennie Maizes on Wed May 30, 2011  9:28 AM ------      Message from: Ellouise Newer III      Created: Tue May 29, 2011  4:52 PM       Same dose.            Stay on Lovenox injections            PT/INR Friday.

## 2011-05-30 NOTE — Telephone Encounter (Signed)
Pt called to office this am to confirm appointment. Instructed she was to continue same dose Coumadin and daily Lovenox injections here at the clinic. Verbalized understanding. Appointment given for today at 930am.

## 2011-05-30 NOTE — Progress Notes (Signed)
Lisa Klein presents today for injection per MD orders. lovenox 120 mg administered SQ in left Abdomen. Administration without incident. Patient tolerated well.

## 2011-05-31 ENCOUNTER — Encounter (HOSPITAL_BASED_OUTPATIENT_CLINIC_OR_DEPARTMENT_OTHER): Payer: Medicare HMO

## 2011-05-31 ENCOUNTER — Other Ambulatory Visit (HOSPITAL_COMMUNITY): Payer: Self-pay | Admitting: Oncology

## 2011-05-31 VITALS — BP 103/62 | HR 82 | Temp 98.1°F | Ht 66.0 in | Wt 182.0 lb

## 2011-05-31 DIAGNOSIS — C801 Malignant (primary) neoplasm, unspecified: Secondary | ICD-10-CM

## 2011-05-31 DIAGNOSIS — C50919 Malignant neoplasm of unspecified site of unspecified female breast: Secondary | ICD-10-CM

## 2011-05-31 DIAGNOSIS — I82409 Acute embolism and thrombosis of unspecified deep veins of unspecified lower extremity: Secondary | ICD-10-CM

## 2011-05-31 MED ORDER — SODIUM CHLORIDE 0.9 % IV SOLN
Freq: Once | INTRAVENOUS | Status: AC
  Administered 2011-05-31: 09:00:00 via INTRAVENOUS

## 2011-05-31 MED ORDER — SODIUM CHLORIDE 0.9 % IV SOLN: 150.0000 mg | Freq: Once | INTRAVENOUS | Status: DC

## 2011-05-31 MED ORDER — SODIUM CHLORIDE 0.9 % IJ SOLN
10.0000 mL | INTRAMUSCULAR | Status: DC | PRN
  Administered 2011-05-31: 10 mL
  Filled 2011-05-31: qty 10

## 2011-05-31 MED ORDER — DEXAMETHASONE SODIUM PHOSPHATE 4 MG/ML IJ SOLN: 12.0000 mg | Freq: Once | INTRAMUSCULAR | Status: DC

## 2011-05-31 MED ORDER — ENOXAPARIN SODIUM 150 MG/ML ~~LOC~~ SOLN
125.0000 mg | Freq: Once | SUBCUTANEOUS | Status: AC
  Administered 2011-05-31: 125 mg via SUBCUTANEOUS
  Filled 2011-05-31: qty 1

## 2011-05-31 MED ORDER — HEPARIN SOD (PORK) LOCK FLUSH 100 UNIT/ML IV SOLN
INTRAVENOUS | Status: AC
  Administered 2011-05-31: 500 [IU]
  Filled 2011-05-31: qty 5

## 2011-05-31 MED ORDER — SODIUM CHLORIDE 0.9 % IV SOLN
Freq: Once | INTRAVENOUS | Status: AC
  Administered 2011-05-31: 09:00:00 via INTRAVENOUS
  Filled 2011-05-31: qty 5

## 2011-05-31 MED ORDER — PALONOSETRON HCL INJECTION 0.25 MG/5ML
0.2500 mg | Freq: Once | INTRAVENOUS | Status: AC
  Administered 2011-05-31: 0.25 mg via INTRAVENOUS
  Filled 2011-05-31: qty 5

## 2011-05-31 MED ORDER — HEPARIN SOD (PORK) LOCK FLUSH 100 UNIT/ML IV SOLN
500.0000 [IU] | Freq: Once | INTRAVENOUS | Status: AC | PRN
  Administered 2011-05-31: 500 [IU]
  Filled 2011-05-31: qty 5

## 2011-05-31 MED ORDER — SODIUM CHLORIDE 0.9 % IV SOLN
510.0000 mg/m2 | Freq: Once | INTRAVENOUS | Status: AC
  Administered 2011-05-31: 980 mg via INTRAVENOUS
  Filled 2011-05-31: qty 49

## 2011-05-31 MED ORDER — SODIUM CHLORIDE 0.9 % IJ SOLN
INTRAMUSCULAR | Status: AC
  Administered 2011-05-31: 10 mL
  Filled 2011-05-31: qty 10

## 2011-05-31 MED ORDER — EPIRUBICIN HCL CHEMO IV INJECTION 200 MG/100ML
76.5000 mg/m2 | Freq: Once | INTRAVENOUS | Status: AC
  Administered 2011-05-31: 148 mg via INTRAVENOUS
  Filled 2011-05-31: qty 74

## 2011-05-31 NOTE — Progress Notes (Signed)
[facility] Discharge Instructions for Patients Receiving Chemotherapy  Today you received the following chemotherapy agents epirubicen and cytoxan  To help prevent nausea and vomiting after your treatment, we encourage you to take your nausea medication Decadron 4 mg take 2 tomorrow and then 2 twice a day for 2 days.  You also have lorazepam Begin taking it as needed and take it as often as prescribed for the next 48 hours.   If you develop nausea and vomiting that is not controlled by your nausea medication, call the clinic. If it is after clinic hours your family physician or the after hours number for the clinic or go to the Emergency Department.   BELOW ARE SYMPTOMS THAT SHOULD BE REPORTED IMMEDIATELY:  *FEVER GREATER THAN 101.0 F  *CHILLS WITH OR WITHOUT FEVER  NAUSEA AND VOMITING THAT IS NOT CONTROLLED WITH YOUR NAUSEA MEDICATION  *UNUSUAL SHORTNESS OF BREATH  *UNUSUAL BRUISING OR BLEEDING  TENDERNESS IN MOUTH AND THROAT WITH OR WITHOUT PRESENCE OF ULCERS  *URINARY PROBLEMS  *BOWEL PROBLEMS  UNUSUAL RASH Items with * indicate a potential emergency and should be followed up as soon as possible.  One of the nurses will contact you 24 hours after your treatment. Please let the nurse know about any problems that you may have experienced. Feel free to call the clinic you have any questions or concerns. The clinic phone number is [phone].   I have been informed and understand all the instructions given to me. I know to contact the clinic, my physician, or go to the Emergency Department if any problems should occur. I do not have any questions at this time, but understand that I may call the clinic during office hours or the Patient Navigator at [phone] should I have any questions or need assistance in obtaining follow up care.    __________________________________________  _____________  __________ Signature of Patient or Authorized Representative             Date                   Time    __________________________________________ Nurse's Signature

## 2011-06-01 ENCOUNTER — Encounter (HOSPITAL_COMMUNITY): Payer: Medicare HMO

## 2011-06-01 ENCOUNTER — Other Ambulatory Visit (HOSPITAL_COMMUNITY): Payer: Self-pay | Admitting: Oncology

## 2011-06-01 ENCOUNTER — Encounter (HOSPITAL_BASED_OUTPATIENT_CLINIC_OR_DEPARTMENT_OTHER): Payer: Medicare HMO

## 2011-06-01 ENCOUNTER — Telehealth (HOSPITAL_COMMUNITY): Payer: Self-pay | Admitting: *Deleted

## 2011-06-01 DIAGNOSIS — C801 Malignant (primary) neoplasm, unspecified: Secondary | ICD-10-CM

## 2011-06-01 DIAGNOSIS — I82409 Acute embolism and thrombosis of unspecified deep veins of unspecified lower extremity: Secondary | ICD-10-CM

## 2011-06-01 DIAGNOSIS — C50919 Malignant neoplasm of unspecified site of unspecified female breast: Secondary | ICD-10-CM

## 2011-06-01 LAB — PROTIME-INR: Prothrombin Time: 25.4 seconds — ABNORMAL HIGH (ref 11.6–15.2)

## 2011-06-01 MED ORDER — PEGFILGRASTIM INJECTION 6 MG/0.6ML
SUBCUTANEOUS | Status: AC
  Administered 2011-06-01: 6 mg via SUBCUTANEOUS
  Filled 2011-06-01: qty 0.6

## 2011-06-01 MED ORDER — ENOXAPARIN SODIUM 150 MG/ML ~~LOC~~ SOLN
125.0000 mg | Freq: Once | SUBCUTANEOUS | Status: AC
  Administered 2011-06-01: 125 mg via SUBCUTANEOUS
  Filled 2011-06-01: qty 1

## 2011-06-01 MED ORDER — PEGFILGRASTIM INJECTION 6 MG/0.6ML
6.0000 mg | Freq: Once | SUBCUTANEOUS | Status: AC
  Administered 2011-06-01: 6 mg via SUBCUTANEOUS

## 2011-06-01 NOTE — Telephone Encounter (Signed)
Left message on Lisa Klein's answering machine as well as I spoke to her daughter [name] with below instructions. Verbalized understanding.

## 2011-06-01 NOTE — Telephone Encounter (Signed)
Message copied by Dennie Maizes on Fri Jun 01, 2011  3:43 PM ------      Message from: Ellouise Newer III      Created: Fri Jun 01, 2011  3:40 PM       Same dose Coumadin            PT/INR in one week.            STOP LOVENOX!!! Renee Rival!!!

## 2011-06-01 NOTE — Progress Notes (Signed)
Lisa Klein presents today for injection per MD orders. Neulasta 6mg administered SQ in right Abdomen. Administration without incident. Patient tolerated well.  

## 2011-06-01 NOTE — Progress Notes (Signed)
Charleene G Schooler presents today for injection per MD orders. lovenox 125 mg administered SQ in left Abdomen. Administration without incident. Patient tolerated well.  

## 2011-06-01 NOTE — Progress Notes (Signed)
Labs drawn today for pt.  Patient on 9mg  of coud and lovenox shots.  Call patient at [phone]

## 2011-06-05 ENCOUNTER — Encounter (HOSPITAL_BASED_OUTPATIENT_CLINIC_OR_DEPARTMENT_OTHER): Payer: Medicare HMO | Admitting: Oncology

## 2011-06-05 VITALS — BP 117/62 | HR 77 | Temp 97.4°F | Ht 66.0 in | Wt 179.8 lb

## 2011-06-05 DIAGNOSIS — C50919 Malignant neoplasm of unspecified site of unspecified female breast: Secondary | ICD-10-CM

## 2011-06-05 NOTE — Patient Instructions (Signed)
[facility]  Discharge Instructions  RECOMMENDATIONS MADE BY THE CONSULTANT AND ANY TEST RESULTS WILL BE SENT TO YOUR REFERRING DOCTOR.   EXAM FINDINGS BY MD TODAY AND SIGNS AND SYMPTOMS TO REPORT TO CLINIC OR PRIMARY MD: will reduce your chemotherapy dosages by 5% to see if we can improve your fatigue level.  Report increase in fatigue, any new lumps, bone pain or shortness of breath.  MEDICATIONS PRESCRIBED: none      SPECIAL INSTRUCTIONS/FOLLOW-UP: Lab work Needed 11/14  and Return to Clinic on chemo on 11/15 and MD 7 days later.   I acknowledge that I have been informed and understand all the instructions given to me and received a copy. I do not have any more questions at this time, but understand that I may call the Specialty Clinic at [facility] at [phone] during business hours should I have any further questions or need assistance in obtaining follow-up care.    __________________________________________  _____________  __________ Signature of Patient or Authorized Representative            Date                   Time    __________________________________________ Nurse's Signature

## 2011-06-05 NOTE — Progress Notes (Signed)
This office note has been dictated.

## 2011-06-06 NOTE — Progress Notes (Signed)
CC:   Lisa Klein. Lodema Hong, M.D. Lurline Hare, M.D. Dalia Heading, M.D.  DIAGNOSES: 1. Advanced local regional breast cancer with 4 foci of invasive     disease surrounding a large area of ductal carcinoma in situ, and     she had 6/7 positive nodes with extensive lymphovascular invasion.     The largest focus of invasive cancer was 1.5 cm.  Two lesions of     that size were found.  The 3rd lesion was 1 cm and the 4th lesion     was 7 mm.  She underwent a left modified radical mastectomy on     02/14/2011.  This was felt to be a high-grade 3 cancer.  The ductal     carcinoma in situ was also high-grade.  She had extracapsular     extension in 4 lymph nodes.  ER receptors were 100%, PR receptors     were only 14%.  Ki-67 marker was high at 45%.  HER-2/neu was     negative. 2. History of hypertension. 3. Mild excessive weight. 4. History of appendectomy and cholecystectomy simultaneously many     years ago. 5. History of hysterectomy for excessive uterine bleeding many years     ago. 6. Mildly poor dental hygiene. 7. Degenerative arthritis. 8. Hyperlipidemia. 9. History of hypothyroidism on Synthroid. Lisa Klein is having a hard time with this chemotherapy.  I chose epirubicin and Cytoxan because of her age and hoping that it would not be as tough on her.  She is having tremendous fatigue and weakness.  She is not able to do all of her usual activities of daily living.  She has not bedridden yet, but she has gone at least from a performance status of 0 to 1/2.  Therefore she has grade 3 toxicity and I am going to reduce her dose a little bit more.  I have gone from 15% dose reduction and I will go to 20% dose reduction.  She otherwise looks good and she is again accompanied by her daughter today.  Her vital signs show that her weight is down from 182 pounds in October to 179 pounds today, not a lot of difference.  Other vital signs are quite good.  She is afebrile.  She denies  any pain, just fatigue. She needs a little help getting up from the chair and over to the table from her daughter.  She is now walking with a cane because of her weakness.  Her lymph nodes are negative throughout.  Port is intact.  Left chest wall is clear.  Right breast is negative for any masses.  Her lungs are clear.  Heart shows no S3 gallop but she has a rate right around 80-82. Abdomen is soft and nontender, without organomegaly.  She has no leg edema, no arm edema.  I am going to reduce her dose a little bit more and we are going to try to get her through 6 cycles of therapy.  I will see her in about 3 weeks and we will see how she has done with this mild dose reduction.   ______________________________ Ladona Horns. Mariel Sleet, MD ESN/MEDQ  D:  06/05/2011  T:  06/06/2011  Job:  [id number]

## 2011-06-08 ENCOUNTER — Other Ambulatory Visit (HOSPITAL_COMMUNITY): Payer: Self-pay | Admitting: Oncology

## 2011-06-08 ENCOUNTER — Other Ambulatory Visit: Payer: Self-pay | Admitting: Family Medicine

## 2011-06-08 ENCOUNTER — Encounter (HOSPITAL_COMMUNITY): Payer: Medicare HMO | Attending: Oncology

## 2011-06-08 DIAGNOSIS — I82409 Acute embolism and thrombosis of unspecified deep veins of unspecified lower extremity: Secondary | ICD-10-CM

## 2011-06-08 DIAGNOSIS — C50919 Malignant neoplasm of unspecified site of unspecified female breast: Secondary | ICD-10-CM | POA: Insufficient documentation

## 2011-06-08 DIAGNOSIS — C801 Malignant (primary) neoplasm, unspecified: Secondary | ICD-10-CM | POA: Insufficient documentation

## 2011-06-08 DIAGNOSIS — M6250 Muscle wasting and atrophy, not elsewhere classified, unspecified site: Secondary | ICD-10-CM | POA: Insufficient documentation

## 2011-06-08 DIAGNOSIS — R5381 Other malaise: Secondary | ICD-10-CM | POA: Insufficient documentation

## 2011-06-08 LAB — PROTIME-INR
INR: 1.71 — ABNORMAL HIGH (ref 0.00–1.49)
Prothrombin Time: 20.4 seconds — ABNORMAL HIGH (ref 11.6–15.2)

## 2011-06-08 NOTE — Progress Notes (Signed)
Labs drawn today for pt.  Patient on 9mg .  Call patient at [phone]

## 2011-06-11 ENCOUNTER — Telehealth (HOSPITAL_COMMUNITY): Payer: Self-pay

## 2011-06-11 ENCOUNTER — Other Ambulatory Visit (HOSPITAL_COMMUNITY): Payer: Self-pay | Admitting: Oncology

## 2011-06-11 ENCOUNTER — Encounter (HOSPITAL_BASED_OUTPATIENT_CLINIC_OR_DEPARTMENT_OTHER): Payer: Medicare HMO

## 2011-06-11 DIAGNOSIS — I82409 Acute embolism and thrombosis of unspecified deep veins of unspecified lower extremity: Secondary | ICD-10-CM

## 2011-06-11 LAB — PROTIME-INR
INR: 3.08 — ABNORMAL HIGH (ref 0.00–1.49)
Prothrombin Time: 32.3 seconds — ABNORMAL HIGH (ref 11.6–15.2)

## 2011-06-11 NOTE — Progress Notes (Signed)
Labs drawn today for pt.  Patient on 9mg of coud.  Call patient at [phone] 

## 2011-06-11 NOTE — Telephone Encounter (Signed)
Notes Recorded by Dellis Anes, PA on 06/11/2011 at 3:29 PM Same dose  Let's check again on Friday  1759 patient notified./S.Jencarlo Bonadonna, RN

## 2011-06-15 ENCOUNTER — Telehealth (HOSPITAL_COMMUNITY): Payer: Self-pay | Admitting: *Deleted

## 2011-06-15 ENCOUNTER — Encounter (HOSPITAL_COMMUNITY): Payer: Medicare HMO

## 2011-06-15 ENCOUNTER — Other Ambulatory Visit (HOSPITAL_COMMUNITY): Payer: Self-pay | Admitting: Oncology

## 2011-06-15 DIAGNOSIS — I82409 Acute embolism and thrombosis of unspecified deep veins of unspecified lower extremity: Secondary | ICD-10-CM

## 2011-06-15 NOTE — Telephone Encounter (Signed)
Message copied by Dennie Maizes on Fri Jun 15, 2011  1:28 PM ------      Message from: Ellouise Newer III      Created: Fri Jun 15, 2011 11:34 AM       Vitamin K 10 mg PO today.  Please call this Rx in to her pharmacy.            Hold Coumadin            Any Shonia Skilling medications?  Change in appetite?            PT/INR on Monday

## 2011-06-15 NOTE — Progress Notes (Signed)
Labs drawn today for pt.  Patient on 9mg  of coud.  Call patient at [phone]

## 2011-06-15 NOTE — Telephone Encounter (Signed)
Spoke with pt, instructions as below. Pt verbalized understanding. Vitamin K called to [facility]. Pt will pick up medicine ASAP. Lab appointment given for Monday.

## 2011-06-15 NOTE — Progress Notes (Signed)
Critical lab value of PT 53.1 and INR 5.83 called from lab and reported to T. Kefala, PA-C.

## 2011-06-18 ENCOUNTER — Encounter (HOSPITAL_COMMUNITY): Payer: Medicare HMO

## 2011-06-18 LAB — PROTIME-INR: INR: 1.22 (ref 0.00–1.49)

## 2011-06-18 NOTE — Progress Notes (Signed)
Labs drawn today for pt. Patient has had no coud since [place].  Call patient at [phone]

## 2011-06-20 ENCOUNTER — Encounter (HOSPITAL_BASED_OUTPATIENT_CLINIC_OR_DEPARTMENT_OTHER): Payer: Medicare HMO

## 2011-06-20 ENCOUNTER — Other Ambulatory Visit (HOSPITAL_COMMUNITY): Payer: Self-pay | Admitting: Oncology

## 2011-06-20 DIAGNOSIS — R058 Other specified cough: Secondary | ICD-10-CM

## 2011-06-20 DIAGNOSIS — C50919 Malignant neoplasm of unspecified site of unspecified female breast: Secondary | ICD-10-CM

## 2011-06-20 DIAGNOSIS — R05 Cough: Secondary | ICD-10-CM

## 2011-06-20 LAB — COMPREHENSIVE METABOLIC PANEL
BUN: 8 mg/dL (ref 6–23)
CO2: 29 mEq/L (ref 19–32)
Chloride: 101 mEq/L (ref 96–112)
Creatinine, Ser: 0.83 mg/dL (ref 0.50–1.10)
GFR calc Af Amer: 76 mL/min — ABNORMAL LOW (ref >90)
GFR calc non Af Amer: 66 mL/min — ABNORMAL LOW (ref >90)
Total Bilirubin: 0.4 mg/dL (ref 0.3–1.2)

## 2011-06-20 LAB — CBC
HCT: 32.8 % — ABNORMAL LOW (ref 36.0–46.0)
Hemoglobin: 10.7 g/dL — ABNORMAL LOW (ref 12.0–15.0)
MCHC: 32.6 g/dL (ref 30.0–36.0)

## 2011-06-20 LAB — DIFFERENTIAL
Basophils Relative: 4 % — ABNORMAL HIGH (ref 0–1)
Monocytes Absolute: 1.1 10*3/uL — ABNORMAL HIGH (ref 0.1–1.0)
Monocytes Relative: 27 % — ABNORMAL HIGH (ref 3–12)
Neutro Abs: 2.3 10*3/uL (ref 1.7–7.7)

## 2011-06-20 MED ORDER — AMOXICILLIN-POT CLAVULANATE 875-125 MG PO TABS: 1.0000 | ORAL_TABLET | Freq: Two times a day (BID) | ORAL | Status: AC

## 2011-06-20 NOTE — Progress Notes (Signed)
Labs drawn today for cbc/diff,cmp 

## 2011-06-21 ENCOUNTER — Encounter (HOSPITAL_BASED_OUTPATIENT_CLINIC_OR_DEPARTMENT_OTHER): Payer: Medicare HMO

## 2011-06-21 VITALS — BP 138/91 | HR 109 | Temp 99.0°F | Wt 179.4 lb

## 2011-06-21 DIAGNOSIS — C50419 Malignant neoplasm of upper-outer quadrant of unspecified female breast: Secondary | ICD-10-CM

## 2011-06-21 DIAGNOSIS — I82409 Acute embolism and thrombosis of unspecified deep veins of unspecified lower extremity: Secondary | ICD-10-CM

## 2011-06-21 DIAGNOSIS — Z5111 Encounter for antineoplastic chemotherapy: Secondary | ICD-10-CM

## 2011-06-21 DIAGNOSIS — C801 Malignant (primary) neoplasm, unspecified: Secondary | ICD-10-CM

## 2011-06-21 MED ORDER — WARFARIN SODIUM 5 MG PO TABS: ORAL_TABLET | ORAL | Status: DC

## 2011-06-21 MED ORDER — DEXAMETHASONE SODIUM PHOSPHATE 4 MG/ML IJ SOLN: 12.0000 mg | Freq: Once | INTRAMUSCULAR | Status: DC

## 2011-06-21 MED ORDER — SODIUM CHLORIDE 0.9 % IJ SOLN
10.0000 mL | INTRAMUSCULAR | Status: DC | PRN
  Administered 2011-06-21: 10 mL
  Filled 2011-06-21: qty 10

## 2011-06-21 MED ORDER — SODIUM CHLORIDE 0.9 % IV SOLN
480.0000 mg/m2 | Freq: Once | INTRAVENOUS | Status: AC
  Administered 2011-06-21: 920 mg via INTRAVENOUS
  Filled 2011-06-21 (×2): qty 46

## 2011-06-21 MED ORDER — PALONOSETRON HCL INJECTION 0.25 MG/5ML
0.2500 mg | Freq: Once | INTRAVENOUS | Status: AC
  Administered 2011-06-21: 0.25 mg via INTRAVENOUS
  Filled 2011-06-21: qty 5

## 2011-06-21 MED ORDER — EPIRUBICIN HCL CHEMO IV INJECTION 200 MG/100ML
72.0000 mg/m2 | Freq: Once | INTRAVENOUS | Status: AC
  Administered 2011-06-21: 138 mg via INTRAVENOUS
  Filled 2011-06-21 (×3): qty 69

## 2011-06-21 MED ORDER — SODIUM CHLORIDE 0.9 % IV SOLN
Freq: Once | INTRAVENOUS | Status: AC
  Administered 2011-06-21: 10:00:00 via INTRAVENOUS
  Filled 2011-06-21: qty 5

## 2011-06-21 MED ORDER — HEPARIN SOD (PORK) LOCK FLUSH 100 UNIT/ML IV SOLN
500.0000 [IU] | Freq: Once | INTRAVENOUS | Status: AC | PRN
  Administered 2011-06-21: 500 [IU]
  Filled 2011-06-21: qty 5

## 2011-06-21 MED ORDER — HEPARIN SOD (PORK) LOCK FLUSH 100 UNIT/ML IV SOLN
INTRAVENOUS | Status: AC
  Filled 2011-06-21: qty 5

## 2011-06-21 MED ORDER — SODIUM CHLORIDE 0.9 % IV SOLN: 150.0000 mg | Freq: Once | INTRAVENOUS | Status: DC

## 2011-06-21 MED ORDER — SODIUM CHLORIDE 0.9 % IV SOLN
Freq: Once | INTRAVENOUS | Status: AC
  Administered 2011-06-21: 10:00:00 via INTRAVENOUS

## 2011-06-21 NOTE — Progress Notes (Signed)
Infusion complete, patient tolerated well.   

## 2011-06-22 ENCOUNTER — Encounter (HOSPITAL_BASED_OUTPATIENT_CLINIC_OR_DEPARTMENT_OTHER): Payer: Medicare HMO

## 2011-06-22 DIAGNOSIS — C773 Secondary and unspecified malignant neoplasm of axilla and upper limb lymph nodes: Secondary | ICD-10-CM

## 2011-06-22 DIAGNOSIS — C50919 Malignant neoplasm of unspecified site of unspecified female breast: Secondary | ICD-10-CM

## 2011-06-22 DIAGNOSIS — C801 Malignant (primary) neoplasm, unspecified: Secondary | ICD-10-CM

## 2011-06-22 MED ORDER — PEGFILGRASTIM INJECTION 6 MG/0.6ML
6.0000 mg | Freq: Once | SUBCUTANEOUS | Status: AC
  Administered 2011-06-22: 6 mg via SUBCUTANEOUS

## 2011-06-22 MED ORDER — PEGFILGRASTIM INJECTION 6 MG/0.6ML
SUBCUTANEOUS | Status: AC
  Filled 2011-06-22: qty 0.6

## 2011-06-22 NOTE — Progress Notes (Signed)
Lisa Klein presents today for injection per MD orders. Neulasta 6mg  administered SQ in right Abdomen. Administration without incident. Patient tolerated well.

## 2011-06-25 ENCOUNTER — Other Ambulatory Visit (HOSPITAL_COMMUNITY): Payer: Medicare HMO

## 2011-06-27 ENCOUNTER — Other Ambulatory Visit (HOSPITAL_COMMUNITY): Payer: Medicare HMO

## 2011-07-02 ENCOUNTER — Other Ambulatory Visit (HOSPITAL_COMMUNITY): Payer: Self-pay | Admitting: Oncology

## 2011-07-02 ENCOUNTER — Encounter (HOSPITAL_BASED_OUTPATIENT_CLINIC_OR_DEPARTMENT_OTHER): Payer: Medicare HMO | Admitting: Oncology

## 2011-07-02 ENCOUNTER — Encounter (HOSPITAL_COMMUNITY): Payer: Medicare HMO

## 2011-07-02 DIAGNOSIS — M6250 Muscle wasting and atrophy, not elsewhere classified, unspecified site: Secondary | ICD-10-CM

## 2011-07-02 DIAGNOSIS — C773 Secondary and unspecified malignant neoplasm of axilla and upper limb lymph nodes: Secondary | ICD-10-CM

## 2011-07-02 DIAGNOSIS — C50919 Malignant neoplasm of unspecified site of unspecified female breast: Secondary | ICD-10-CM

## 2011-07-02 DIAGNOSIS — I82409 Acute embolism and thrombosis of unspecified deep veins of unspecified lower extremity: Secondary | ICD-10-CM

## 2011-07-02 DIAGNOSIS — R5381 Other malaise: Secondary | ICD-10-CM

## 2011-07-02 DIAGNOSIS — R5383 Other fatigue: Secondary | ICD-10-CM

## 2011-07-02 LAB — PROTIME-INR
INR: 1.18 (ref 0.00–1.49)
Prothrombin Time: 15.3 seconds — ABNORMAL HIGH (ref 11.6–15.2)

## 2011-07-02 NOTE — Patient Instructions (Signed)
[facility]  Discharge Instructions  RECOMMENDATIONS MADE BY THE CONSULTANT AND ANY TEST RESULTS WILL BE SENT TO YOUR REFERRING DOCTOR.   EXAM FINDINGS BY MD TODAY AND SIGNS AND SYMPTOMS TO REPORT TO CLINIC OR PRIMARY MD:   Return in 3 weeks to see Tom.   Please contact [employer] regarding your lunch meals  I have already sent your information to [facility] regarding an in-home assessment for Physical Therapy, Nursing Services, and a Home Health Aide.   If you have not heard from [facility] in the next 3-4 days please call us and we will call them. The local number to the  office of [facility]) is [phone].   I acknowledge that I have been informed and understand all the instructions given to me and received a copy. I do not have any more questions at this time, but understand that I may call the Specialty Clinic at [facility] at [phone] during business hours should I have any further questions or need assistance in obtaining follow-up care.    __________________________________________  _____________  __________ Signature of Patient or Authorized Representative            Date                   Time    __________________________________________ Nurse's Signature

## 2011-07-02 NOTE — Progress Notes (Signed)
Labs drawn today for pt.  Patient on 7mg  qd.  Call patient at [phone]

## 2011-07-02 NOTE — Progress Notes (Signed)
Patient taking Coumadin 7 mg daily at 6pm.

## 2011-07-02 NOTE — Progress Notes (Signed)
Lisa Overman, MD, MD [address], [address] [place] [address]  1. Fatigue  acetaminophen (TYLENOL) 325 MG tablet, Ambulatory referral to Home Health  2. Physical deconditioning  acetaminophen (TYLENOL) 325 MG tablet, Ambulatory referral to Home Health    CURRENT THERAPY: S/P 4 cycles of EC chemotherapy.  S/P modified radical mastectomy on left on 02/14/11.  INTERVAL HISTORY: Lisa Klein 77 y.o. female returns for  regular  visit for followup of  Stage III A invasive ductal carcinoma.  She reports that she has had a slow recovery following cycle 4 of chemotherapy.  She reports that today is her best day and she is slowly recovering.  She reports fatigue.  She explains that she must rest following an activity such as cooking or bathing.  She does admit that around the Thanksgiving holiday, she required family help due to extreme fatigue.  This time period correlates with her nadir.  During this time she could not cook meals, shower, and perform daily activities of living.  However, this is slowly improving.  She has already undergone 2 dose reductions.  We hope to complete 6 cycles of chemotherapy.  She is not opposed to this notion.    Patient education was delivered regarding her symptoms above being secondary to chemotherapy administration.  She continues to take her coumadin as directed.  Her LE extremity symptoms have resolved from her DVT.  We plan on completing 6 months of anticoagulation for this.  Clinically, her right lower extremity DVT has resolved.   Otherwise, the patient is doing well.  She denies any nausea and vomiting.  She reports that with the aid of stool softeners and MOM, she is moving her bowels every other day.  I have asked her to keep this going.  BMs every other day is acceptable.  She denies any pain and shortness of breath.  She inquires about home health.  We will set up a referral for evaluation of needs.  She may need to be evaluated for PT due to  deconditioning secondary to chemotherapy.  Past Medical History  Diagnosis Date  . DJD (degenerative joint disease)   . Hypertension   . Hypothyroidism   . Obesity   . Degenerative disc disease     with nerve compression   . Hypothyroidism   . Hypertension   . Cancer 2012    LEFT BREAST  . Clotting disorder 05/2011    dvt    has HYPOTHYROIDISM; HYPERLIPIDEMIA; OBESITY; HYPERTENSION; DEGENERATIVE JOINT DISEASE; INTERMITTENT VERTIGO; Cancer; Breast cancer; and DVT (deep venous thrombosis) on her problem list.      has no known allergies.  Lisa Klein had no medications administered during this visit.  Past Surgical History  Procedure Date  . Vesicovaginal fistula closure w/ tah     [facility]  . Cholecystectomy 80'S    [facility]  . Mastectomy modified radical 02/14/11    left  . Breast surgery     left total mastectomy  . Portacath placement 04/16/2011    Procedure: INSERTION PORT-A-CATH;  Surgeon: Dalia Heading;  Location: [facility];  Service: General;  Laterality: Right;  right subclavian  . Cholecystectomy     Denies any headaches, dizziness, double vision, fevers, chills, night sweats, nausea, vomiting, diarrhea, constipation, chest pain, heart palpitations, shortness of breath, blood in stool, black tarry stool, urinary pain, urinary burning, urinary frequency, hematuria.   PHYSICAL EXAMINATION  ECOG PERFORMANCE STATUS: 3 - Symptomatic, >50% confined to bed  Filed Vitals:  07/02/11 0925  BP: 92/69  Pulse: 116  Temp:     GENERAL:alert, no distress, well nourished, well developed, comfortable and cooperative SKIN: skin color, texture, turgor are normal, no rashes or significant lesions HEAD: Normocephalic EYES: normal EARS: External ears normal OROPHARYNX:mucous membranes are moist  NECK: supple, trachea midline LYMPH:  no palpable lymphadenopathy BREAST:not examined LUNGS: clear to auscultation  HEART: regular rate & rhythm, no murmurs, no gallops, S1 normal and S2  normal ABDOMEN:abdomen soft, non-tender and normal bowel sounds BACK: Back symmetric, no curvature. EXTREMITIES:less then 2 second capillary refill, no joint deformities, effusion, or inflammation, no edema, no skin discoloration, no clubbing, no cyanosis  NEURO: alert & oriented x 3 with fluent speech, no focal motor/sensory deficits, gait normal   LABORATORY DATA: CBC    Component Value Date/Time   WBC 4.0 06/20/2011 0900   RBC 3.72* 06/20/2011 0900   HGB 10.7* 06/20/2011 0900   HCT 32.8* 06/20/2011 0900   PLT 290 06/20/2011 0900   MCV 88.2 06/20/2011 0900   MCH 28.8 06/20/2011 0900   MCHC 32.6 06/20/2011 0900   RDW 16.4* 06/20/2011 0900   LYMPHSABS 0.4* 06/20/2011 0900   MONOABS 1.1* 06/20/2011 0900   EOSABS 0.0 06/20/2011 0900   BASOSABS 0.2* 06/20/2011 0900      PENDING LABS: PT/INR    ASSESSMENT:  1. Stage IIIA breast cancer, S/P cycle 4 of EC and left modified radical mastectomy on 02/14/11  2. Deconditioning, secondary to chemotherapy 3. Fatigue, secondary to chemotherapy   PLAN:  1. Referral for Home Health to evaluate patient for skilled needs, and physical therapy. 2. Pre-chemo lab work ordered 3. 2D Echo beginning-middle of December to evaluate EF. 4. Return in 3 weeks for follow-up  All questions were answered. The patient knows to call the clinic with any problems, questions or concerns. We can certainly see the patient much sooner if necessary.  The patient and plan discussed with Glenford Peers, MD and he is in agreement with the aforementioned.  I spent 20 minutes counseling the patient face to face. The total time spent in the appointment was 25 minutes.  Lisa Klein

## 2011-07-04 ENCOUNTER — Other Ambulatory Visit (HOSPITAL_COMMUNITY): Payer: Self-pay | Admitting: Oncology

## 2011-07-11 ENCOUNTER — Encounter (HOSPITAL_COMMUNITY): Payer: Medicare HMO | Attending: Oncology

## 2011-07-11 ENCOUNTER — Other Ambulatory Visit (HOSPITAL_COMMUNITY): Payer: Self-pay | Admitting: Oncology

## 2011-07-11 DIAGNOSIS — C50919 Malignant neoplasm of unspecified site of unspecified female breast: Secondary | ICD-10-CM | POA: Insufficient documentation

## 2011-07-11 DIAGNOSIS — I82409 Acute embolism and thrombosis of unspecified deep veins of unspecified lower extremity: Secondary | ICD-10-CM

## 2011-07-11 DIAGNOSIS — C801 Malignant (primary) neoplasm, unspecified: Secondary | ICD-10-CM | POA: Insufficient documentation

## 2011-07-11 LAB — COMPREHENSIVE METABOLIC PANEL
AST: 19 U/L (ref 0–37)
Albumin: 3.3 g/dL — ABNORMAL LOW (ref 3.5–5.2)
CO2: 25 mEq/L (ref 19–32)
Calcium: 10.2 mg/dL (ref 8.4–10.5)
Creatinine, Ser: 0.77 mg/dL (ref 0.50–1.10)
GFR calc non Af Amer: 78 mL/min — ABNORMAL LOW (ref >90)

## 2011-07-11 LAB — DIFFERENTIAL
Basophils Absolute: 0 10*3/uL (ref 0.0–0.1)
Basophils Relative: 0 % (ref 0–1)
Eosinophils Absolute: 0 10*3/uL (ref 0.0–0.7)
Eosinophils Relative: 0 % (ref 0–5)
Monocytes Absolute: 0.6 10*3/uL (ref 0.1–1.0)

## 2011-07-11 LAB — CBC
MCH: 29.2 pg (ref 26.0–34.0)
MCV: 88 fL (ref 78.0–100.0)
Platelets: 299 10*3/uL (ref 150–400)
RDW: 17 % — ABNORMAL HIGH (ref 11.5–15.5)

## 2011-07-11 NOTE — Progress Notes (Signed)
Patient had labs drawn today for pt, cbc/diff/cmp. Patient on 8mg  of coud.  Call patient at [phone]

## 2011-07-12 ENCOUNTER — Encounter (HOSPITAL_BASED_OUTPATIENT_CLINIC_OR_DEPARTMENT_OTHER): Payer: Medicare HMO

## 2011-07-12 VITALS — BP 115/69 | HR 89 | Temp 97.0°F | Wt 176.2 lb

## 2011-07-12 DIAGNOSIS — C50919 Malignant neoplasm of unspecified site of unspecified female breast: Secondary | ICD-10-CM

## 2011-07-12 DIAGNOSIS — Z5111 Encounter for antineoplastic chemotherapy: Secondary | ICD-10-CM

## 2011-07-12 DIAGNOSIS — C801 Malignant (primary) neoplasm, unspecified: Secondary | ICD-10-CM

## 2011-07-12 DIAGNOSIS — C773 Secondary and unspecified malignant neoplasm of axilla and upper limb lymph nodes: Secondary | ICD-10-CM

## 2011-07-12 MED ORDER — HEPARIN SOD (PORK) LOCK FLUSH 100 UNIT/ML IV SOLN
INTRAVENOUS | Status: AC
  Filled 2011-07-12: qty 5

## 2011-07-12 MED ORDER — DEXAMETHASONE SODIUM PHOSPHATE 4 MG/ML IJ SOLN: 12.0000 mg | Freq: Once | INTRAMUSCULAR | Status: DC

## 2011-07-12 MED ORDER — SODIUM CHLORIDE 0.9 % IJ SOLN
INTRAMUSCULAR | Status: AC
  Filled 2011-07-12: qty 20

## 2011-07-12 MED ORDER — SODIUM CHLORIDE 0.9 % IV SOLN: 150.0000 mg | Freq: Once | INTRAVENOUS | Status: DC

## 2011-07-12 MED ORDER — PALONOSETRON HCL INJECTION 0.25 MG/5ML
0.2500 mg | Freq: Once | INTRAVENOUS | Status: AC
  Administered 2011-07-12: 0.25 mg via INTRAVENOUS

## 2011-07-12 MED ORDER — SODIUM CHLORIDE 0.9 % IJ SOLN
INTRAMUSCULAR | Status: AC
  Filled 2011-07-12: qty 10

## 2011-07-12 MED ORDER — HEPARIN SOD (PORK) LOCK FLUSH 100 UNIT/ML IV SOLN
500.0000 [IU] | Freq: Once | INTRAVENOUS | Status: AC | PRN
  Administered 2011-07-12: 500 [IU]
  Filled 2011-07-12: qty 5

## 2011-07-12 MED ORDER — EPIRUBICIN HCL CHEMO IV INJECTION 200 MG/100ML
72.0000 mg/m2 | Freq: Once | INTRAVENOUS | Status: AC
  Administered 2011-07-12: 138 mg via INTRAVENOUS
  Filled 2011-07-12: qty 69

## 2011-07-12 MED ORDER — PALONOSETRON HCL INJECTION 0.25 MG/5ML
INTRAVENOUS | Status: AC
  Filled 2011-07-12: qty 5

## 2011-07-12 MED ORDER — SODIUM CHLORIDE 0.9 % IV SOLN
Freq: Once | INTRAVENOUS | Status: AC
  Administered 2011-07-12: 10:00:00 via INTRAVENOUS

## 2011-07-12 MED ORDER — SODIUM CHLORIDE 0.9 % IJ SOLN
10.0000 mL | INTRAMUSCULAR | Status: DC | PRN
  Administered 2011-07-12: 10 mL
  Filled 2011-07-12: qty 10

## 2011-07-12 MED ORDER — SODIUM CHLORIDE 0.9 % IV SOLN
480.0000 mg/m2 | Freq: Once | INTRAVENOUS | Status: AC
  Administered 2011-07-12: 920 mg via INTRAVENOUS
  Filled 2011-07-12: qty 46

## 2011-07-12 MED ORDER — SODIUM CHLORIDE 0.9 % IV SOLN
Freq: Once | INTRAVENOUS | Status: AC
  Administered 2011-07-12: 10:00:00 via INTRAVENOUS
  Filled 2011-07-12: qty 5

## 2011-07-12 NOTE — Progress Notes (Signed)
Infusion complete, patient tolerated well.   

## 2011-07-13 ENCOUNTER — Telehealth (HOSPITAL_COMMUNITY): Payer: Self-pay

## 2011-07-13 ENCOUNTER — Encounter (HOSPITAL_BASED_OUTPATIENT_CLINIC_OR_DEPARTMENT_OTHER): Payer: Medicare HMO

## 2011-07-13 DIAGNOSIS — C773 Secondary and unspecified malignant neoplasm of axilla and upper limb lymph nodes: Secondary | ICD-10-CM

## 2011-07-13 DIAGNOSIS — C50919 Malignant neoplasm of unspecified site of unspecified female breast: Secondary | ICD-10-CM

## 2011-07-13 DIAGNOSIS — C801 Malignant (primary) neoplasm, unspecified: Secondary | ICD-10-CM

## 2011-07-13 MED ORDER — PEGFILGRASTIM INJECTION 6 MG/0.6ML
6.0000 mg | Freq: Once | SUBCUTANEOUS | Status: AC
  Administered 2011-07-13: 6 mg via SUBCUTANEOUS

## 2011-07-13 MED ORDER — PEGFILGRASTIM INJECTION 6 MG/0.6ML
SUBCUTANEOUS | Status: AC
  Administered 2011-07-13: 6 mg via SUBCUTANEOUS
  Filled 2011-07-13: qty 0.6

## 2011-07-13 NOTE — Progress Notes (Signed)
Marquis Buggy presents today for injection per MD orders. Neulasta 6mg  administered SQ in right Abdomen. Administration without incident. Patient tolerated well. No problems post chemo.

## 2011-07-16 ENCOUNTER — Ambulatory Visit (HOSPITAL_COMMUNITY)
Admission: RE | Admit: 2011-07-16 | Discharge: 2011-07-16 | Disposition: A | Discharge status: Home / Self Care | Payer: Medicare HMO | Source: Ambulatory Visit | Attending: Oncology | Admitting: Oncology

## 2011-07-16 ENCOUNTER — Other Ambulatory Visit: Payer: Self-pay | Admitting: Certified Registered Nurse Anesthetist

## 2011-07-16 DIAGNOSIS — I059 Rheumatic mitral valve disease, unspecified: Secondary | ICD-10-CM

## 2011-07-16 DIAGNOSIS — C50919 Malignant neoplasm of unspecified site of unspecified female breast: Secondary | ICD-10-CM | POA: Insufficient documentation

## 2011-07-16 DIAGNOSIS — I10 Essential (primary) hypertension: Secondary | ICD-10-CM | POA: Insufficient documentation

## 2011-07-16 NOTE — Progress Notes (Signed)
*  PRELIMINARY RESULTS* Echocardiogram 2D Echocardiogram has been performed.  Conrad  07/16/2011, 11:41 AM

## 2011-07-18 ENCOUNTER — Other Ambulatory Visit (HOSPITAL_COMMUNITY): Payer: Medicare HMO

## 2011-07-23 ENCOUNTER — Ambulatory Visit (HOSPITAL_COMMUNITY): Payer: Medicare HMO | Admitting: Oncology

## 2011-07-25 ENCOUNTER — Telehealth (HOSPITAL_COMMUNITY): Payer: Self-pay

## 2011-07-25 NOTE — Telephone Encounter (Signed)
Spoke with patient, states she is feeling some better but still very weak.  Informed her that chemotherapy would be cancelled until she could be seen by either Jenita Seashore, PA or Dr. Mariel Sleet and patient agreed that she did not want to do any chemotherapy until she was seen by someone.  Appointments for chemotherapy were cancelled and appointment made for her to be seen by Jenita Seashore, PA on 08/03/11 @ 12 noon.  Verbalized understanding.

## 2011-08-01 ENCOUNTER — Other Ambulatory Visit (HOSPITAL_COMMUNITY): Payer: Medicare HMO

## 2011-08-01 ENCOUNTER — Ambulatory Visit (HOSPITAL_COMMUNITY): Payer: Medicare HMO | Admitting: Oncology

## 2011-08-02 ENCOUNTER — Telehealth (HOSPITAL_COMMUNITY): Payer: Self-pay | Admitting: Oncology

## 2011-08-02 ENCOUNTER — Ambulatory Visit (HOSPITAL_COMMUNITY): Payer: Medicare HMO | Admitting: Oncology

## 2011-08-02 ENCOUNTER — Inpatient Hospital Stay (HOSPITAL_COMMUNITY): Payer: Medicare HMO

## 2011-08-02 NOTE — Telephone Encounter (Signed)
Per T. Kefalas, PA-C, patient is to increase her KlorCon 10 meq tabs to 3 tabs daily for the next 2 weeks.  I spoke with Marquis Buggy and advised her of the change and patient verbalized understanding.

## 2011-08-03 ENCOUNTER — Ambulatory Visit (HOSPITAL_COMMUNITY): Payer: Medicare HMO

## 2011-08-03 ENCOUNTER — Encounter (HOSPITAL_BASED_OUTPATIENT_CLINIC_OR_DEPARTMENT_OTHER): Payer: Medicare HMO | Admitting: Oncology

## 2011-08-03 ENCOUNTER — Ambulatory Visit (HOSPITAL_COMMUNITY): Payer: Medicare HMO | Admitting: Oncology

## 2011-08-03 ENCOUNTER — Telehealth (HOSPITAL_COMMUNITY): Payer: Self-pay | Admitting: Oncology

## 2011-08-03 DIAGNOSIS — C50919 Malignant neoplasm of unspecified site of unspecified female breast: Secondary | ICD-10-CM

## 2011-08-03 DIAGNOSIS — Z86718 Personal history of other venous thrombosis and embolism: Secondary | ICD-10-CM

## 2011-08-03 DIAGNOSIS — I82409 Acute embolism and thrombosis of unspecified deep veins of unspecified lower extremity: Secondary | ICD-10-CM

## 2011-08-03 LAB — COMPREHENSIVE METABOLIC PANEL
ALT: 13 U/L (ref 0–35)
Albumin: 3.3 g/dL — ABNORMAL LOW (ref 3.5–5.2)
Calcium: 9.5 mg/dL (ref 8.4–10.5)
GFR calc Af Amer: >90 mL/min (ref >90)
Glucose, Bld: 76 mg/dL (ref 70–99)
Sodium: 139 mEq/L (ref 135–145)
Total Protein: 6.5 g/dL (ref 6.0–8.3)

## 2011-08-03 LAB — DIFFERENTIAL
Basophils Absolute: 0.1 10*3/uL (ref 0.0–0.1)
Basophils Relative: 3 % — ABNORMAL HIGH (ref 0–1)
Eosinophils Absolute: 0 10*3/uL (ref 0.0–0.7)
Eosinophils Relative: 1 % (ref 0–5)
Lymphs Abs: 0.9 10*3/uL (ref 0.7–4.0)
Neutrophils Relative %: 54 % (ref 43–77)

## 2011-08-03 LAB — CBC
MCH: 29.9 pg (ref 26.0–34.0)
MCHC: 31.8 g/dL (ref 30.0–36.0)
MCV: 93.9 fL (ref 78.0–100.0)
Platelets: 236 10*3/uL (ref 150–400)
RDW: 18.9 % — ABNORMAL HIGH (ref 11.5–15.5)

## 2011-08-03 NOTE — Progress Notes (Signed)
Lisa Overman, MD, MD [address], [address] [place] [address]  1. Infiltrating ductal carcinoma of breast  potassium chloride (K-DUR,KLOR-CON) 10 MEQ tablet, warfarin (COUMADIN) 5 MG tablet, CBC, Differential, Comprehensive metabolic panel, Protime-INR, CBC, Differential, Basic metabolic panel, Protime-INR, Protime-INR  2. DVT (deep venous thrombosis)  potassium chloride (K-DUR,KLOR-CON) 10 MEQ tablet, warfarin (COUMADIN) 5 MG tablet, CBC, Differential, Comprehensive metabolic panel, Protime-INR, Protime-INR, Protime-INR  3. Breast cancer  CBC, Differential, Comprehensive metabolic panel    CURRENT THERAPY: S/P 5 cycles of EC chemotherapy. S/P modified radical mastectomy on left on 02/14/11.   INTERVAL HISTORY: Lisa Klein 77 y.o. female returns for  regular  visit for followup of  Stage III A invasive ductal carcinoma.  The patient is slow to recover from her last cycle of chemotherapy.  She notes significantly decreased stamina and fatigue.  She denies any vomiting.  She reports that today is her best day.  She is not too thrilled with continuing with her last cycle of chemotherapy.  She was due for her last cycle of chemotherapy the other day, but this was postponed until she was seen by a provider. She missed her appointment which is unusual for the patient a few weeks ago. This was because she was not feeling well and was still recovering from her previous cycle of chemotherapy. We've been in contact with the home health nurses to have been informing us of the patient's condition.  The patient reports that she fell a few weeks ago and loss consciousness.  She cannot tell me the duration of this but she reports that she fell out of a chair.  She did not injure herself.  Fortunately she has Home Health who helps her at home.  Last week, the patient reported some shortness of breath, but this has resolved.   The patient has been getting lab work from home Health, but the last  lab work failed to have a PT/INR reported.  We will do this today.  The patient's sister is caring for her mother who is 54 years old and will be turning 104 in a few weeks. It sounds as though her mother is mildly/moderately demented.   The patient will undergo her final cycle of chemotherapy next week with a 75% dose reduction. Following this, she will move on with radiation therapy.  Following radiation, she will then embark on hormonal therapy. We went over education regarding the importance of her completing her sixth cycle of chemotherapy. She understands and is willing to pursue cycle 6 of chemotherapy.  Past Medical History  Diagnosis Date  . DJD (degenerative joint disease)   . Hypertension   . Hypothyroidism   . Obesity   . Degenerative disc disease     with nerve compression   . Hypothyroidism   . Hypertension   . Cancer 2012    LEFT BREAST  . Clotting disorder 05/2011    dvt    has HYPOTHYROIDISM; HYPERLIPIDEMIA; OBESITY; HYPERTENSION; DEGENERATIVE JOINT DISEASE; INTERMITTENT VERTIGO; Cancer; Infiltrating ductal carcinoma of breast; and DVT (deep venous thrombosis) on her problem list.      has no known allergies.  Ms. Harvell had no medications administered during this visit.  Past Surgical History  Procedure Date  . Vesicovaginal fistula closure w/ tah     [facility]  . Cholecystectomy 80'S    [facility]  . Mastectomy modified radical 02/14/11    left  . Breast surgery     left total mastectomy  .  Portacath placement 04/16/2011    Procedure: INSERTION PORT-A-CATH;  Surgeon: Dalia Heading;  Location: [facility];  Service: General;  Laterality: Right;  right subclavian  . Cholecystectomy     Denies any headaches, dizziness, double vision, fevers, chills, night sweats, nausea, vomiting, diarrhea, constipation, chest pain, heart palpitations, shortness of breath, blood in stool, black tarry stool, urinary pain, urinary burning, urinary frequency, hematuria.   PHYSICAL  EXAMINATION  ECOG PERFORMANCE STATUS: 1 - Symptomatic but completely ambulatory  Filed Vitals:   08/03/11 1158  BP: 130/80  Pulse: 105  Temp:     GENERAL:alert, no distress, well nourished, well developed, comfortable, cooperative and smiling SKIN: skin color, texture, turgor are normal, no rashes or significant lesions HEAD: Normocephalic, No masses, lesions, tenderness or abnormalities EYES: normal EARS: External ears normal OROPHARYNX:mucous membranes are moist  NECK: supple, trachea midline LYMPH:  no palpable lymphadenopathy BREAST:not examined LUNGS: clear to auscultation  HEART: regular rate & rhythm, no murmurs, no gallops, S1 normal and S2 normal ABDOMEN:abdomen soft, non-tender and normal bowel sounds BACK: Back symmetric, no curvature., No CVA tenderness EXTREMITIES:less then 2 second capillary refill, no joint deformities, effusion, or inflammation, no skin discoloration, no clubbing, no cyanosis, positive findings:  Right lower extremity 1+ pitting edema pretibially. Right lower extremity erythema and heat. NEURO: alert & oriented x 3 with fluent speech, no focal motor/sensory deficits, gait normal, gait is slow but stable    LABORATORY DATA: CBC    Component Value Date/Time   WBC 4.3 08/03/2011 1324   RBC 3.28* 08/03/2011 1324   HGB 9.8* 08/03/2011 1324   HCT 30.8* 08/03/2011 1324   PLT 236 08/03/2011 1324   MCV 93.9 08/03/2011 1324   MCH 29.9 08/03/2011 1324   MCHC 31.8 08/03/2011 1324   RDW 18.9* 08/03/2011 1324   LYMPHSABS 0.9 08/03/2011 1324   MONOABS 0.9 08/03/2011 1324   EOSABS 0.0 08/03/2011 1324   BASOSABS 0.1 08/03/2011 1324      Chemistry      Component Value Date/Time   NA 138 07/11/2011 0842   K 3.8 07/11/2011 0842   CL 101 07/11/2011 0842   CO2 25 07/11/2011 0842   BUN 10 07/11/2011 0842   CREATININE 0.77 07/11/2011 0842   CREATININE 0.86 11/13/2010 1706      Component Value Date/Time   CALCIUM 10.2 07/11/2011 0842   ALKPHOS 67 07/11/2011  0842   AST 19 07/11/2011 0842   ALT 16 07/11/2011 0842   BILITOT 0.2* 07/11/2011 0842       PENDING LABS: CMET, PT/INR   PATHOLOGY:    ASSESSMENT:  1. Stage IIIA breast cancer, S/P cycle 5 of EC and left modified radical mastectomy on 02/14/11  2. Deconditioning, secondary to chemotherapy  3. Fatigue, secondary to chemotherapy   PLAN:  1. Lab work today: CBC diff, CMET, PT/INR 2. Chemotherapy, cycle 6 next week with a 75% dose reduction.  3. Will ask scheduler to make appointment with Radiation Oncology second week of Jan 2013. 4. Lab work 2 weeks following chemotherapy: CBC diff, CMET 5. Following the last cycle of chemotherapy, patient will undergo radiation under the care of Dr. Michell Heinrich.  She will then take 5 years worth of endocrine therapy 2 weeks following completion of radiation therapy. 6. Patient will return for follow-up in 2 months.    All questions were answered. The patient knows to call the clinic with any problems, questions or concerns. We can certainly see the patient much sooner if necessary.  The patient and plan discussed with Glenford Peers, MD and he is in agreement with the aforementioned.  Patient seen and examined by Dr. Glenford Peers.   I spent 30 minutes counseling the patient face to face. The total time spent in the appointment was 40 minutes. More than 50% of the time spent with the patient was utilized for counseling and coordination of care.   KEFALAS,THOMAS

## 2011-08-03 NOTE — Telephone Encounter (Signed)
Per T. Kefalas, PA-C, patient advised to increase her coumadin to 9 mg daily and have her PT/INR rechecked on 08/06/11.  Patient called and advised of same; lab appt made.

## 2011-08-03 NOTE — Progress Notes (Signed)
Lisa Klein presented for labwork. Labs per MD order drawn via Peripheral Line 25 gauge needle inserted in rt arm.  Good blood return present. Procedure without incident.  Needle removed intact. Patient tolerated procedure well.

## 2011-08-03 NOTE — Patient Instructions (Signed)
[facility]  Discharge Instructions  RECOMMENDATIONS MADE BY THE CONSULTANT AND ANY TEST RESULTS WILL BE SENT TO YOUR REFERRING DOCTOR.   EXAM FINDINGS BY MD TODAY AND SIGNS AND SYMPTOMS TO REPORT TO CLINIC OR PRIMARY MD: we will plan on chemo next week.   SPECIAL INSTRUCTIONS/FOLLOW-UP: Lab work Needed .[name] Done today. We will call you about coumadin dose.   I acknowledge that I have been informed and understand all the instructions given to me and received a copy. I do not have any more questions at this time, but understand that I may call the Specialty Clinic at [facility] at [phone] during business hours should I have any further questions or need assistance in obtaining follow-up care.    __________________________________________  _____________  __________ Signature of Patient or Authorized Representative            Date                   Time    __________________________________________ Nurse's Signature

## 2011-08-06 ENCOUNTER — Other Ambulatory Visit (HOSPITAL_COMMUNITY): Payer: Self-pay | Admitting: Oncology

## 2011-08-06 ENCOUNTER — Encounter (HOSPITAL_BASED_OUTPATIENT_CLINIC_OR_DEPARTMENT_OTHER): Payer: Medicare HMO

## 2011-08-06 DIAGNOSIS — I82409 Acute embolism and thrombosis of unspecified deep veins of unspecified lower extremity: Secondary | ICD-10-CM

## 2011-08-06 DIAGNOSIS — C50919 Malignant neoplasm of unspecified site of unspecified female breast: Secondary | ICD-10-CM

## 2011-08-06 DIAGNOSIS — C773 Secondary and unspecified malignant neoplasm of axilla and upper limb lymph nodes: Secondary | ICD-10-CM

## 2011-08-06 LAB — PROTIME-INR
INR: 1.79 — ABNORMAL HIGH (ref 0.00–1.49)
Prothrombin Time: 21.1 seconds — ABNORMAL HIGH (ref 11.6–15.2)

## 2011-08-06 NOTE — Progress Notes (Signed)
Labs drawn today for pt.  Patient on 9mg of coud.  Call patient at [phone] 

## 2011-08-06 NOTE — Progress Notes (Signed)
This encounter was created in error - please disregard.

## 2011-08-06 NOTE — Progress Notes (Signed)
Patient instructed to continue same dose of Coumadin and we will recheck level on Thursday when pt gets chemo. Patient verbalized understanding.

## 2011-08-09 ENCOUNTER — Other Ambulatory Visit (HOSPITAL_COMMUNITY): Payer: Self-pay | Admitting: Oncology

## 2011-08-09 ENCOUNTER — Telehealth (HOSPITAL_COMMUNITY): Payer: Self-pay | Admitting: *Deleted

## 2011-08-09 ENCOUNTER — Encounter (HOSPITAL_COMMUNITY): Payer: Medicare HMO | Attending: Oncology

## 2011-08-09 DIAGNOSIS — I82409 Acute embolism and thrombosis of unspecified deep veins of unspecified lower extremity: Secondary | ICD-10-CM | POA: Insufficient documentation

## 2011-08-09 DIAGNOSIS — C801 Malignant (primary) neoplasm, unspecified: Secondary | ICD-10-CM | POA: Insufficient documentation

## 2011-08-09 DIAGNOSIS — Z5111 Encounter for antineoplastic chemotherapy: Secondary | ICD-10-CM

## 2011-08-09 DIAGNOSIS — C773 Secondary and unspecified malignant neoplasm of axilla and upper limb lymph nodes: Secondary | ICD-10-CM

## 2011-08-09 DIAGNOSIS — C50919 Malignant neoplasm of unspecified site of unspecified female breast: Secondary | ICD-10-CM | POA: Insufficient documentation

## 2011-08-09 LAB — PROTIME-INR: Prothrombin Time: 19.5 seconds — ABNORMAL HIGH (ref 11.6–15.2)

## 2011-08-09 LAB — COMPREHENSIVE METABOLIC PANEL
ALT: 11 U/L (ref 0–35)
AST: 17 U/L (ref 0–37)
Albumin: 3.2 g/dL — ABNORMAL LOW (ref 3.5–5.2)
Calcium: 9.8 mg/dL (ref 8.4–10.5)
Sodium: 139 mEq/L (ref 135–145)
Total Protein: 6.2 g/dL (ref 6.0–8.3)

## 2011-08-09 LAB — DIFFERENTIAL
Basophils Absolute: 0.1 10*3/uL (ref 0.0–0.1)
Eosinophils Absolute: 0.1 10*3/uL (ref 0.0–0.7)
Eosinophils Relative: 2 % (ref 0–5)
Neutrophils Relative %: 51 % (ref 43–77)

## 2011-08-09 LAB — CBC
MCH: 29.9 pg (ref 26.0–34.0)
MCV: 93.4 fL (ref 78.0–100.0)
Platelets: 216 10*3/uL (ref 150–400)
RDW: 18.3 % — ABNORMAL HIGH (ref 11.5–15.5)
WBC: 3.3 10*3/uL — ABNORMAL LOW (ref 4.0–10.5)

## 2011-08-09 MED ORDER — PALONOSETRON HCL INJECTION 0.25 MG/5ML
INTRAVENOUS | Status: AC
  Filled 2011-08-09: qty 5

## 2011-08-09 MED ORDER — SODIUM CHLORIDE 0.9 % IV SOLN: 150.0000 mg | Freq: Once | INTRAVENOUS | Status: DC

## 2011-08-09 MED ORDER — SODIUM CHLORIDE 0.9 % IV SOLN
Freq: Once | INTRAVENOUS | Status: AC
  Administered 2011-08-09: 11:00:00 via INTRAVENOUS
  Filled 2011-08-09: qty 5

## 2011-08-09 MED ORDER — SODIUM CHLORIDE 0.9 % IV SOLN
450.0000 mg/m2 | Freq: Once | INTRAVENOUS | Status: AC
  Administered 2011-08-09: 860 mg via INTRAVENOUS
  Filled 2011-08-09: qty 43

## 2011-08-09 MED ORDER — SODIUM CHLORIDE 0.9 % IJ SOLN
10.0000 mL | INTRAMUSCULAR | Status: DC | PRN
  Administered 2011-08-09: 10 mL
  Filled 2011-08-09: qty 10

## 2011-08-09 MED ORDER — EPIRUBICIN HCL CHEMO IV INJECTION 200 MG/100ML
67.5000 mg/m2 | Freq: Once | INTRAVENOUS | Status: AC
  Administered 2011-08-09: 130 mg via INTRAVENOUS
  Filled 2011-08-09: qty 65

## 2011-08-09 MED ORDER — PALONOSETRON HCL INJECTION 0.25 MG/5ML
0.2500 mg | Freq: Once | INTRAVENOUS | Status: AC
  Administered 2011-08-09: 0.25 mg via INTRAVENOUS

## 2011-08-09 MED ORDER — DEXAMETHASONE SODIUM PHOSPHATE 4 MG/ML IJ SOLN: 12.0000 mg | Freq: Once | INTRAMUSCULAR | Status: DC

## 2011-08-09 MED ORDER — SODIUM CHLORIDE 0.9 % IV SOLN
Freq: Once | INTRAVENOUS | Status: AC
  Administered 2011-08-09: 10:00:00 via INTRAVENOUS

## 2011-08-09 NOTE — Telephone Encounter (Signed)
According to chart review, patient is taking 9 mg daily. Can we verify this? Is she missing any doses?  If this is true and she is not missing doses, lets change coumadin to 10 mg daily  PT/INR Monday         Patient instructed to take 10/9 alternating per [facility]. Patient took 8mg  last pm instead of 9mg  because she ran out of 1mg  tablets. Patient wrote down how to take coumadin. Pt to return Monday @ 1020 for inr.

## 2011-08-09 NOTE — Progress Notes (Signed)
Tolerated well

## 2011-08-10 ENCOUNTER — Other Ambulatory Visit (HOSPITAL_COMMUNITY): Payer: Medicare HMO

## 2011-08-10 ENCOUNTER — Encounter (HOSPITAL_BASED_OUTPATIENT_CLINIC_OR_DEPARTMENT_OTHER): Payer: Medicare HMO

## 2011-08-10 VITALS — BP 108/67 | HR 73

## 2011-08-10 DIAGNOSIS — C801 Malignant (primary) neoplasm, unspecified: Secondary | ICD-10-CM

## 2011-08-10 DIAGNOSIS — C50919 Malignant neoplasm of unspecified site of unspecified female breast: Secondary | ICD-10-CM

## 2011-08-10 DIAGNOSIS — C773 Secondary and unspecified malignant neoplasm of axilla and upper limb lymph nodes: Secondary | ICD-10-CM

## 2011-08-10 MED ORDER — PEGFILGRASTIM INJECTION 6 MG/0.6ML
6.0000 mg | Freq: Once | SUBCUTANEOUS | Status: AC
  Administered 2011-08-10: 6 mg via SUBCUTANEOUS

## 2011-08-10 MED ORDER — PEGFILGRASTIM INJECTION 6 MG/0.6ML
SUBCUTANEOUS | Status: AC
  Filled 2011-08-10: qty 0.6

## 2011-08-10 NOTE — Progress Notes (Signed)
Tolerated neulasta inj well.

## 2011-08-11 ENCOUNTER — Other Ambulatory Visit: Payer: Self-pay | Admitting: Family Medicine

## 2011-08-13 ENCOUNTER — Encounter (HOSPITAL_BASED_OUTPATIENT_CLINIC_OR_DEPARTMENT_OTHER): Payer: Medicare HMO

## 2011-08-13 ENCOUNTER — Other Ambulatory Visit (HOSPITAL_COMMUNITY): Payer: Self-pay | Admitting: Oncology

## 2011-08-13 DIAGNOSIS — I82409 Acute embolism and thrombosis of unspecified deep veins of unspecified lower extremity: Secondary | ICD-10-CM

## 2011-08-13 DIAGNOSIS — Z86718 Personal history of other venous thrombosis and embolism: Secondary | ICD-10-CM

## 2011-08-13 NOTE — Progress Notes (Signed)
Labs drawn today for 9 and 10 mg alternating.  Call patient at [phone]

## 2011-08-16 ENCOUNTER — Telehealth (HOSPITAL_COMMUNITY): Payer: Self-pay | Admitting: Oncology

## 2011-08-17 ENCOUNTER — Telehealth (HOSPITAL_COMMUNITY): Payer: Self-pay | Admitting: Oncology

## 2011-08-17 ENCOUNTER — Other Ambulatory Visit (HOSPITAL_COMMUNITY): Payer: Medicare HMO

## 2011-08-20 ENCOUNTER — Inpatient Hospital Stay (HOSPITAL_COMMUNITY): Payer: Medicare HMO

## 2011-08-21 ENCOUNTER — Ambulatory Visit (HOSPITAL_COMMUNITY): Payer: Medicare HMO

## 2011-08-23 ENCOUNTER — Encounter (HOSPITAL_BASED_OUTPATIENT_CLINIC_OR_DEPARTMENT_OTHER): Payer: Medicare HMO

## 2011-08-23 ENCOUNTER — Other Ambulatory Visit (HOSPITAL_COMMUNITY): Payer: Self-pay | Admitting: Oncology

## 2011-08-23 DIAGNOSIS — I82409 Acute embolism and thrombosis of unspecified deep veins of unspecified lower extremity: Secondary | ICD-10-CM

## 2011-08-23 DIAGNOSIS — Z86718 Personal history of other venous thrombosis and embolism: Secondary | ICD-10-CM

## 2011-08-23 DIAGNOSIS — C50919 Malignant neoplasm of unspecified site of unspecified female breast: Secondary | ICD-10-CM

## 2011-08-23 LAB — DIFFERENTIAL
Eosinophils Relative: 0 % (ref 0–5)
Lymphocytes Relative: 15 % (ref 12–46)
Lymphs Abs: 0.5 10*3/uL — ABNORMAL LOW (ref 0.7–4.0)
Monocytes Absolute: 0.6 10*3/uL (ref 0.1–1.0)

## 2011-08-23 LAB — BASIC METABOLIC PANEL
CO2: 30 mEq/L (ref 19–32)
Calcium: 9.6 mg/dL (ref 8.4–10.5)
Glucose, Bld: 114 mg/dL — ABNORMAL HIGH (ref 70–99)
Sodium: 139 mEq/L (ref 135–145)

## 2011-08-23 LAB — CBC
HCT: 30.3 % — ABNORMAL LOW (ref 36.0–46.0)
Hemoglobin: 9.8 g/dL — ABNORMAL LOW (ref 12.0–15.0)
MCV: 94.1 fL (ref 78.0–100.0)
Platelets: 169 10*3/uL (ref 150–400)
RBC: 3.22 MIL/uL — ABNORMAL LOW (ref 3.87–5.11)
WBC: 3.5 10*3/uL — ABNORMAL LOW (ref 4.0–10.5)

## 2011-08-23 LAB — PROTIME-INR: INR: 2 — ABNORMAL HIGH (ref 0.00–1.49)

## 2011-08-23 NOTE — Progress Notes (Signed)
Labs drawn today for pt,cbc.diff,bmp.  Patient on 9 and 10mg  alternating.  Call patient at [phone]

## 2011-08-23 NOTE — Progress Notes (Signed)
Patient instructed to continue same dose of coumadin and to return in 2 weeks for pt/inr. Appt time given. Patient verbalized understanding.

## 2011-09-06 ENCOUNTER — Encounter (HOSPITAL_COMMUNITY): Payer: Medicare HMO

## 2011-09-06 ENCOUNTER — Telehealth (HOSPITAL_COMMUNITY): Payer: Self-pay | Admitting: *Deleted

## 2011-09-06 ENCOUNTER — Other Ambulatory Visit (HOSPITAL_COMMUNITY): Payer: Self-pay | Admitting: Oncology

## 2011-09-06 DIAGNOSIS — I82409 Acute embolism and thrombosis of unspecified deep veins of unspecified lower extremity: Secondary | ICD-10-CM

## 2011-09-06 NOTE — Telephone Encounter (Signed)
Spoke with pt, instructions as below. Verbalized understanding. 

## 2011-09-06 NOTE — Telephone Encounter (Signed)
Message copied by Dennie Maizes on Thu Sep 06, 2011  2:16 PM ------      Message from: Ellouise Newer III      Created: Thu Sep 06, 2011 11:20 AM       Same dose            PT/INR in 2 weeks

## 2011-09-10 ENCOUNTER — Inpatient Hospital Stay (HOSPITAL_COMMUNITY): Payer: Medicare HMO

## 2011-09-11 ENCOUNTER — Other Ambulatory Visit: Payer: Self-pay | Admitting: Family Medicine

## 2011-09-18 ENCOUNTER — Ambulatory Visit: Payer: Medicare HMO | Admitting: Family Medicine

## 2011-09-19 ENCOUNTER — Encounter: Payer: Self-pay | Admitting: Family Medicine

## 2011-09-19 NOTE — Telephone Encounter (Signed)
Tolerated well

## 2011-09-20 ENCOUNTER — Other Ambulatory Visit (HOSPITAL_COMMUNITY): Payer: Medicare HMO

## 2011-09-20 ENCOUNTER — Encounter (HOSPITAL_COMMUNITY): Payer: Medicare HMO | Attending: Oncology

## 2011-09-20 ENCOUNTER — Telehealth (HOSPITAL_COMMUNITY): Payer: Self-pay | Admitting: *Deleted

## 2011-09-20 ENCOUNTER — Other Ambulatory Visit (HOSPITAL_COMMUNITY): Payer: Self-pay | Admitting: Oncology

## 2011-09-20 DIAGNOSIS — C50919 Malignant neoplasm of unspecified site of unspecified female breast: Secondary | ICD-10-CM

## 2011-09-20 DIAGNOSIS — I82409 Acute embolism and thrombosis of unspecified deep veins of unspecified lower extremity: Secondary | ICD-10-CM

## 2011-09-20 LAB — PROTIME-INR
INR: 1.92 — ABNORMAL HIGH (ref 0.00–1.49)
Prothrombin Time: 22.3 seconds — ABNORMAL HIGH (ref 11.6–15.2)

## 2011-09-20 MED ORDER — SODIUM CHLORIDE 0.9 % IJ SOLN
10.0000 mL | INTRAMUSCULAR | Status: DC | PRN
  Administered 2011-09-20: 10 mL via INTRAVENOUS
  Filled 2011-09-20: qty 10

## 2011-09-20 MED ORDER — HEPARIN SOD (PORK) LOCK FLUSH 100 UNIT/ML IV SOLN
500.0000 [IU] | Freq: Once | INTRAVENOUS | Status: AC
  Administered 2011-09-20: 500 [IU] via INTRAVENOUS
  Filled 2011-09-20: qty 5

## 2011-09-20 MED ORDER — HEPARIN SOD (PORK) LOCK FLUSH 100 UNIT/ML IV SOLN
INTRAVENOUS | Status: AC
  Administered 2011-09-20: 500 [IU] via INTRAVENOUS
  Filled 2011-09-20: qty 5

## 2011-09-20 NOTE — Progress Notes (Addendum)
Lisa Klein presented for Portacath access and flush. Proper placement of portacath confirmed by CXR. Portacath located right chest wall accessed with  H 20 needle. Good blood return present.  Labs obtained for PT/INR. Portacath flushed with 20ml NS and 500U/110ml Heparin and needle removed intact. Procedure without incident. Patient tolerated procedure well.

## 2011-09-20 NOTE — Telephone Encounter (Signed)
Message copied by Dennie Maizes on Thu Sep 20, 2011  2:36 PM ------      Message from: Ellouise Newer III      Created: Thu Sep 20, 2011  2:24 PM       Same dose            PT/INR in 2 weeks

## 2011-09-20 NOTE — Telephone Encounter (Signed)
Spoke with pt as below. Pt verbalized understanding. Pt has MD appt day prior to lab appt and I told her we could check her labs then.

## 2011-09-25 ENCOUNTER — Ambulatory Visit (INDEPENDENT_AMBULATORY_CARE_PROVIDER_SITE_OTHER): Payer: Medicare HMO | Admitting: Family Medicine

## 2011-09-25 ENCOUNTER — Encounter: Payer: Self-pay | Admitting: Family Medicine

## 2011-09-25 VITALS — BP 132/68 | HR 70 | Resp 18 | Ht 66.0 in | Wt 180.1 lb

## 2011-09-25 DIAGNOSIS — I10 Essential (primary) hypertension: Secondary | ICD-10-CM

## 2011-09-25 DIAGNOSIS — E669 Obesity, unspecified: Secondary | ICD-10-CM

## 2011-09-25 DIAGNOSIS — E785 Hyperlipidemia, unspecified: Secondary | ICD-10-CM

## 2011-09-25 DIAGNOSIS — E039 Hypothyroidism, unspecified: Secondary | ICD-10-CM

## 2011-09-25 MED ORDER — LOVASTATIN 20 MG PO TABS: 20.0000 mg | ORAL_TABLET | Freq: Every day | ORAL | Status: DC

## 2011-09-25 NOTE — Patient Instructions (Addendum)
F/u in 17month 3 weeks.  Please call if you need me   You look better, and I am happy that you have finished the chemotherapy.  Lipid, cmp , tsh and vit D fasting this week pls

## 2011-09-27 LAB — COMPLETE METABOLIC PANEL WITH GFR
ALT: 9 U/L (ref 0–35)
AST: 20 U/L (ref 0–37)
Albumin: 3.8 g/dL (ref 3.5–5.2)
CO2: 28 mEq/L (ref 19–32)
Calcium: 9.4 mg/dL (ref 8.4–10.5)
Chloride: 106 mEq/L (ref 96–112)
Creat: 0.7 mg/dL (ref 0.50–1.10)
GFR, Est African American: >89 mL/min
Potassium: 3.8 mEq/L (ref 3.5–5.3)

## 2011-09-27 LAB — LIPID PANEL
Cholesterol: 169 mg/dL (ref 0–200)
LDL Cholesterol: 102 mg/dL — ABNORMAL HIGH (ref 0–99)
Triglycerides: 97 mg/dL (ref <150)

## 2011-09-27 LAB — VITAMIN D 25 HYDROXY (VIT D DEFICIENCY, FRACTURES): Vit D, 25-Hydroxy: 46 ng/mL (ref 30–89)

## 2011-10-03 ENCOUNTER — Encounter (HOSPITAL_BASED_OUTPATIENT_CLINIC_OR_DEPARTMENT_OTHER): Payer: Medicare HMO | Admitting: Oncology

## 2011-10-03 ENCOUNTER — Ambulatory Visit (HOSPITAL_COMMUNITY): Payer: Medicare HMO | Admitting: Oncology

## 2011-10-03 DIAGNOSIS — C50919 Malignant neoplasm of unspecified site of unspecified female breast: Secondary | ICD-10-CM

## 2011-10-03 DIAGNOSIS — I82409 Acute embolism and thrombosis of unspecified deep veins of unspecified lower extremity: Secondary | ICD-10-CM

## 2011-10-03 DIAGNOSIS — Z86718 Personal history of other venous thrombosis and embolism: Secondary | ICD-10-CM

## 2011-10-03 NOTE — Patient Instructions (Signed)
Lisa Klein  [id number] June 29, 1930   [facility]  Discharge Instructions  RECOMMENDATIONS MADE BY THE CONSULTANT AND ANY TEST RESULTS WILL BE SENT TO YOUR REFERRING DOCTOR.   EXAM FINDINGS BY MD TODAY AND SIGNS AND SYMPTOMS TO REPORT TO CLINIC OR PRIMARY MD: you are doing well.  Report any new lumps, bone pain or shortness of breath.  MEDICATIONS PRESCRIBED: none      SPECIAL INSTRUCTIONS/FOLLOW-UP: Return to Clinic in 1 month to see PA   I acknowledge that I have been informed and understand all the instructions given to me and received a copy. I do not have any more questions at this time, but understand that I may call the Specialty Clinic at [facility] at [phone] during business hours should I have any further questions or need assistance in obtaining follow-up care.    __________________________________________  _____________  __________ Signature of Patient or Authorized Representative            Date                   Time    __________________________________________ Nurse's Signature

## 2011-10-03 NOTE — Progress Notes (Signed)
[name], MD, MD [address] [place] [address]  1. Infiltrating ductal carcinoma of breast   2. DVT (deep venous thrombosis)     CURRENT THERAPY:Presently undergoing radiation therapy.  S/P 6 cycles of EC chemotherapy with a significant dose reduction. S/P modified radical mastectomy on left on 02/14/11.   INTERVAL HISTORY: Lisa Klein 78 y.o. female returns for  regular  visit for followup of  Stage III A invasive ductal carcinoma.  The patient is seen in follow-up.  She is doing very well.  She is 1/2 way through radiation therapy.  She is tolerating radiation well.    We spent some time discussing further anti-hormonal therapy, namely Arimidex.  We discussed risks and benefits of this therapy including side effect such as hot flashes, arthralgias, and myalgias.  She will take this for 5 years.  She understands the above and all questions were answered.   I spent some time discussing treatment of her DVT.  She will take Coumadin for a total of 6 months.  She knows this.  We will continue to monitor her INRs.  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  No prolonged cough. No dyspnea or chest pain on exertion.  No abdominal pain, change in bowel habits, black or bloody stools.  No urinary tract symptoms.  No new or unusual musculoskeletal symptoms.  Normal menses, no abnormal vaginal bleeding, discharge or unexpected pelvic pain. No new breast lumps, breast pain or nipple discharge.   Past Medical History  Diagnosis Date  . DJD (degenerative joint disease)   . Hypertension   . Hypothyroidism   . Obesity   . Degenerative disc disease     with nerve compression   . Hypothyroidism   . Hypertension   . Cancer 2012    LEFT BREAST  . Clotting disorder 05/2011    dvt    has HYPOTHYROIDISM; HYPERLIPIDEMIA; OBESITY; HYPERTENSION; DEGENERATIVE JOINT DISEASE;  INTERMITTENT VERTIGO; Cancer; Infiltrating ductal carcinoma of breast; and DVT (deep venous thrombosis) on her problem list.      has no known allergies.  Ms. Pollitt does not currently have medications on file.  Past Surgical History  Procedure Date  . Vesicovaginal fistula closure w/ tah     [facility]  . Cholecystectomy 80'S    [facility]  . Mastectomy modified radical 02/14/11    left  . Portacath placement 04/16/2011    Procedure: INSERTION PORT-A-CATH;  Surgeon: Dalia Heading;  Location: [facility];  Service: General;  Laterality: Right;  right subclavian  . Cholecystectomy   . Breast surgery     left total mastectomy    Denies any headaches, dizziness, double vision, fevers, chills, night sweats, nausea, vomiting, diarrhea, constipation, chest pain, heart palpitations, shortness of breath, blood in stool, black tarry stool, urinary pain, urinary burning, urinary frequency, hematuria.   PHYSICAL EXAMINATION  ECOG PERFORMANCE STATUS: 1 - Symptomatic but completely ambulatory  Filed Vitals:   10/03/11 1521  BP: 129/77  Pulse: 93  Temp: 98 F (36.7 C)    GENERAL:alert, no distress, well nourished, well developed, comfortable, cooperative, smiling and alopecic SKIN: skin color, texture, turgor are normal, no rashes or significant lesions HEAD: Normocephalic, No masses, lesions, tenderness or abnormalities EYES: normal EARS: External ears normal OROPHARYNX:mucous membranes are moist  NECK: supple, trachea midline LYMPH:  no palpable lymphadenopathy BREAST:not examined LUNGS: clear to auscultation and  percussion HEART: regular rate & rhythm, no murmurs, no gallops, S1 normal and S2 normal ABDOMEN:abdomen soft, non-tender, obese and normal bowel sounds BACK: Back symmetric, no curvature., No CVA tenderness EXTREMITIES:less then 2 second capillary refill, no joint deformities, effusion, or inflammation, no skin discoloration, no clubbing, no cyanosis, positive findings:  edema B/L trace  LE pitting edema.  NEURO: alert & oriented x 3 with fluent speech, no focal motor/sensory deficits, gait normal   LABORATORY DATA: CBC    Component Value Date/Time   WBC 3.5* 08/23/2011 0933   RBC 3.22* 08/23/2011 0933   HGB 9.8* 08/23/2011 0933   HCT 30.3* 08/23/2011 0933   PLT 169 08/23/2011 0933   MCV 94.1 08/23/2011 0933   MCH 30.4 08/23/2011 0933   MCHC 32.3 08/23/2011 0933   RDW 16.4* 08/23/2011 0933   LYMPHSABS 0.5* 08/23/2011 0933   MONOABS 0.6 08/23/2011 0933   EOSABS 0.0 08/23/2011 0933   BASOSABS 0.0 08/23/2011 0933     RADIOGRAPHIC STUDIES:  05/10/11  *RADIOLOGY REPORT*  Clinical Data: Right lower extremity swelling, history breast  cancer, question deep venous thrombosis  RIGHT LOWER EXTREMITY VENOUS DUPLEX ULTRASOUND  Technique: Gray-scale sonography with graded compression, as well  as color Doppler and duplex ultrasound, were performed to evaluate  the deep venous system of the lower extremity from the level of the  common femoral vein through the popliteal and proximal calf veins.  Spectral Doppler was utilized to evaluate flow at rest and with  distal augmentation maneuvers.  Comparison: None  Findings:  Common femoral, profunda femoral, and femoral veins appear  compressible and patent, with evidence of spontaneous venous flow  and respiratory phasicity.  However, hypoechoic thrombus is identified within the right  popliteal vein, extending into the proximal trifurcation calf veins  consistent with acute deep venous thrombosis. Additional thrombus  is also identified within gastrocnemius vein branches and extending  into the superficial venous system at the lesser saphenous vein.  IMPRESSION:  Acute deep venous thrombosis involving the right popliteal and  proximal calf veins with additional superficial thrombophlebitis at  the lesser saphenous vein.  Findings called by Milon Dikes to Lenise Herald PA on 05/10/2011 at  1139 hours.  Original Report  Authenticated By: Lollie Marrow, M.D.   PATHOLOGY:  02/14/2011  FINAL DIAGNOSIS Diagnosis Breast, modified radical mastectomy , left - MULTIFOCAL INVASIVE DUCTAL CARCINOMA (FOUR FOCI), 1.5 CM LARGEST FOCUS, GRADE III/III. - INVOLVEMENT OF THE DEEP SURGICAL MARGIN OF RESECTION BY CARCINOMA. - METASTATIC CARCINOMA IN SIX OUT OF SEVEN LEFT AXILLARY LYMPH NODES. - SEE ONCOLOGY TABLE. Microscopic Comment BREAST, WITH LYMPH NODE SAMPLING Specimen, including laterality: Left breast. Procedure: Modified radical mastectomy. Tumor size of largest invasive carcinoma (gross measurement or glass slide measurement): There are four foci of invasive carcinoma, measuring 1.5 cm, 1.5 cm, 1.0 cm and 0.7 cm. Margins: Invasive component, distance to closest margin: One of the foci around the area of the 3 o'clock (lateral) involves the deep surgical margin of resection. In situ component, distance to closest margin: Less than 0.1 cm from the deep surgical margin of resection. Lymph - Vascular invasion: Yes, prominent. Histologic type, invasive component: Invasive ductal carcinoma in all foci. Grade, invasive component (Nottingham combined histologic score): III (all foci). Tubule formation grade: 3. Nuclear pleomorphism grade: 3. Mitotic grade: 2-3. Ductal carcinoma in situ: Yes. Nuclear grade: High grade. Necrosis: Yes. Extensive intraductal component: No. Lobular neoplasia present: No. Treatment effect (if treated with neoadjuvant therapy): Not applicable. Multicentric (separate tumors  in different quadrants): Yes. Multifocal (separate tumors in same quadrant or biopsy): Yes. 1 of 3 FINAL for KRISTAL, PERL [phone]) Microscopic Comment(continued) Macroscopic or microscopic extent of tumor: Skin: No. Nipple: No. Skeletal muscle: Unknown. Axillary lymph nodes: Number examined: 7. Number with metastasis: 6. ITC (isolated tumor cells, < 0.30mm): 0. Micrometastasis (> 0.18mm, < 2mm):  0. Metastasis > 2 mm: 2. Extracapsular extension: 4. Method of detection of metastases: H&E. TNM Code: pT1c, pN2a. Breast Prognostic Markers: Case #[id number]. Estrogen receptor: Positive (100%). Progesterone receptor: Positive (14%). Ki 67 (Mib-1): 45%. HER-2/neu by CISH: No amplification detected. The ratio of HER-2:CEP 17 signals was 0.68. Non-neoplastic breast: Unremarkable. Comments: There are four foci of invasive ductal carcinoma with essentially identical morphology, as seen in the lateral (3 o'clock) area and upper outer quadrant. One of the foci involves the deep surgical margin of resection, as seen in Block 1E. The foci are associated with high grade in situ ductal carcinoma with associated necrosis in addition to prominent lymphovascular space invasion. HER-2/neu will be repeated on a representative block. (BNS/eps 02/19/11) Guerry Bruin MD Pathologist, Electronic Signature (Case signed 02/19/2011) CHROMOGENIC IN-SITU HYBRIDIZATION Interpretation HER-2/NEU BY CISH - NO AMPLIFICATION OF HER-2 DETECTED. THE RATIO OF HER-2: CEP 17 SIGNALS WAS 0.97. Reference range: Ratio: HER2:CEP17 < 1.8 - gene amplification not observed Ratio: HER2:CEP 17 1.8-2.2 - equivocal result Ratio: HER2:CEP17 > 2.2 - gene amplification observed Pecola Leisure MD Pathologist, Electronic Signature ( Signed 02/26/2011)   ASSESSMENT:  1. Stage IIIA breast cancer, S/P cycle 5 of EC and left modified radical mastectomy on 02/14/11  2. DVT of right LE diagnosed on 05/10/2011.  Presently on Coumadin anticoagulation.  Will treat for 6 months   PLAN:  1. She is tolerating radiation therapy well.  2. Continue with radiation therapy.  She is due to complete therapy in 3 more week.  3. Continue anticoagulation for 6 months total. 4. Discussed Arimidex anti-hormonal therapy.  Discussed side effects as mentioned above.  5. Return in one month.  Will give Rx for Arimidex at that time.    All questions were  answered. The patient knows to call the clinic with any problems, questions or concerns. We can certainly see the patient much sooner if necessary.  The patient and plan discussed with Glenford Peers, MD and he is in agreement with the aforementioned.  Mardella Nuckles

## 2011-10-04 ENCOUNTER — Telehealth (HOSPITAL_COMMUNITY): Payer: Self-pay | Admitting: *Deleted

## 2011-10-04 ENCOUNTER — Other Ambulatory Visit (HOSPITAL_COMMUNITY): Payer: Self-pay | Admitting: Oncology

## 2011-10-04 ENCOUNTER — Encounter (HOSPITAL_COMMUNITY): Payer: Medicare HMO

## 2011-10-04 DIAGNOSIS — I82409 Acute embolism and thrombosis of unspecified deep veins of unspecified lower extremity: Secondary | ICD-10-CM

## 2011-10-04 NOTE — Telephone Encounter (Signed)
Message copied by Dennie Maizes on Thu Oct 04, 2011 12:35 PM ------      Message from: Ellouise Newer III      Created: Thu Oct 04, 2011 10:49 AM       Same dose            PT/INR in 2 weeks

## 2011-10-04 NOTE — Telephone Encounter (Signed)
Spoke with pt as below. Instruct to continue Coumadin 9 mg alternating with 10 mg every other day. Return to clinic in 2 weeks for pt/inr. Pt verbalized understanding.

## 2011-10-06 NOTE — Progress Notes (Signed)
  Subjective:    Patient ID: Lisa Klein, female    DOB: [birth year], 78 y.o.   MRN: [id number]  HPI The PT is here for follow up and re-evaluation of chronic medical conditions, medication management and review of any available recent lab and radiology data.  Preventive health is updated, specifically  Cancer screening and Immunization.   Questions or concerns regarding consultations or procedures which the PT has had in the interim are  Addressed.Pt has completed chemo which at times was very rough, states doing better.Currently taking radiation Being followed for coumadin at the coumadin clinic The PT denies any adverse reactions to current medications since the last visit.  There are no new concerns.  There are no specific complaints       Review of Systems    See HPI Denies recent fever or chills. Denies sinus pressure, nasal congestion, ear pain or sore throat. Denies chest congestion, productive cough or wheezing. Denies chest pains, palpitations and leg swelling Denies abdominal pain, nausea, vomiting,diarrhea or constipation.   Denies dysuria, frequency, hesitancy or incontinence. Denies uncontrolled  joint pain, swelling and limitation in mobility. Denies headaches, seizures, numbness, or tingling. Denies depression, anxiety or insomnia. Denies skin break down or rash.     Objective:   Physical Exam Patient alert and oriented and in no cardiopulmonary distress.  HEENT: No facial asymmetry, EOMI, no sinus tenderness,  oropharynx pink and moist.  Neck supple no adenopathy.  Chest: Clear to auscultation bilaterally.  CVS: S1, S2 no murmurs, no S3.  ABD: Soft non tender. Bowel sounds normal.  Ext: No edema  WU:[id number] though adequate ROM spine, shoulders, hips and knees.  Skin: Intact, hyperpigmentation of left anterior chest where receiving radiaition  Psych: Good eye contact, normal affect. Memory intact not anxious or depressed appearing.  CNS: CN  2-12 intact, power, tone and sensation normal throughout.        Assessment & Plan:

## 2011-10-06 NOTE — Assessment & Plan Note (Signed)
Controlled, no change in medication  

## 2011-10-06 NOTE — Assessment & Plan Note (Signed)
Improved. Pt applauded on succesful weight loss through lifestyle change, and encouraged to continue same. Weight loss goal set for the next several months.  

## 2011-10-06 NOTE — Assessment & Plan Note (Addendum)
Hyperlipidemia:Low fat diet discussed and encouraged.  Controlled, no change in medication   

## 2011-10-18 ENCOUNTER — Telehealth (HOSPITAL_COMMUNITY): Payer: Self-pay | Admitting: *Deleted

## 2011-10-18 ENCOUNTER — Other Ambulatory Visit (HOSPITAL_COMMUNITY): Payer: Self-pay | Admitting: Oncology

## 2011-10-18 ENCOUNTER — Encounter (HOSPITAL_COMMUNITY): Payer: Medicare HMO | Attending: Oncology

## 2011-10-18 DIAGNOSIS — I82409 Acute embolism and thrombosis of unspecified deep veins of unspecified lower extremity: Secondary | ICD-10-CM

## 2011-10-18 DIAGNOSIS — C50919 Malignant neoplasm of unspecified site of unspecified female breast: Secondary | ICD-10-CM | POA: Insufficient documentation

## 2011-10-18 LAB — PROTIME-INR
INR: 2.13 — ABNORMAL HIGH (ref 0.00–1.49)
Prothrombin Time: 24.2 seconds — ABNORMAL HIGH (ref 11.6–15.2)

## 2011-10-18 NOTE — Telephone Encounter (Signed)
Message copied by Dennie Maizes on Thu Oct 18, 2011 12:36 PM ------      Message from: Ellouise Newer III      Created: Thu Oct 18, 2011 11:25 AM       Randie Heinz!  Same dose of Coumadin            PT/INR in 3 weeks

## 2011-10-18 NOTE — Telephone Encounter (Signed)
Left message on answering machine to continue same dose of Coumadin and lab due again 4/4 at 9 am. Instruct to call clinic with any questions.

## 2011-10-31 ENCOUNTER — Encounter (HOSPITAL_BASED_OUTPATIENT_CLINIC_OR_DEPARTMENT_OTHER): Payer: Medicare HMO | Admitting: Oncology

## 2011-10-31 ENCOUNTER — Encounter (HOSPITAL_COMMUNITY): Payer: Self-pay | Admitting: Oncology

## 2011-10-31 VITALS — BP 123/82 | HR 99 | Temp 97.8°F | Wt 182.2 lb

## 2011-10-31 DIAGNOSIS — C50419 Malignant neoplasm of upper-outer quadrant of unspecified female breast: Secondary | ICD-10-CM

## 2011-10-31 DIAGNOSIS — R5383 Other fatigue: Secondary | ICD-10-CM

## 2011-10-31 DIAGNOSIS — R5381 Other malaise: Secondary | ICD-10-CM

## 2011-10-31 DIAGNOSIS — I82409 Acute embolism and thrombosis of unspecified deep veins of unspecified lower extremity: Secondary | ICD-10-CM

## 2011-10-31 DIAGNOSIS — C50919 Malignant neoplasm of unspecified site of unspecified female breast: Secondary | ICD-10-CM

## 2011-10-31 LAB — COMPREHENSIVE METABOLIC PANEL
ALT: 11 U/L (ref 0–35)
AST: 17 U/L (ref 0–37)
Albumin: 3.4 g/dL — ABNORMAL LOW (ref 3.5–5.2)
Calcium: 9.3 mg/dL (ref 8.4–10.5)
Potassium: 3.6 mEq/L (ref 3.5–5.1)
Sodium: 140 mEq/L (ref 135–145)
Total Protein: 6.6 g/dL (ref 6.0–8.3)

## 2011-10-31 LAB — DIFFERENTIAL
Basophils Absolute: 0 10*3/uL (ref 0.0–0.1)
Eosinophils Absolute: 0 10*3/uL (ref 0.0–0.7)
Eosinophils Relative: 2 % (ref 0–5)
Lymphocytes Relative: 20 % (ref 12–46)
Neutrophils Relative %: 60 % (ref 43–77)

## 2011-10-31 LAB — CBC
MCH: 28.9 pg (ref 26.0–34.0)
MCHC: 32 g/dL (ref 30.0–36.0)
Platelets: 151 10*3/uL (ref 150–400)

## 2011-10-31 MED ORDER — ANASTROZOLE 1 MG PO TABS: 1.0000 mg | ORAL_TABLET | Freq: Every day | ORAL | Status: AC

## 2011-10-31 NOTE — Progress Notes (Signed)
Lisa Overman, MD, MD [address] [place] [address]  1. Infiltrating ductal carcinoma of breast  CBC, Differential, Comprehensive metabolic panel, anastrozole (ARIMIDEX) 1 MG tablet, CBC, CBC, Comprehensive metabolic panel, Comprehensive metabolic panel, Differential, Differential  2. DVT (deep venous thrombosis)      CURRENT THERAPY:Presently undergoing radiation therapy. S/P 6 cycles of EC chemotherapy with a significant dose reduction. S/P modified radical mastectomy on left on 02/14/11.   INTERVAL HISTORY: Lisa Klein 78 y.o. female returns for  regular  visit for followup of Stage III A invasive ductal carcinoma.   The patient is due to finish of radiation therapy on 11/01/2011. She's tolerating it quite well. She does report some pruritus for which a lotion/cream is applied following each treatment of radiation.   The patient denies any complaints at this point time. She does report some fatigue. She explains that she is able to perform her activities up until approximately 1:00 in the afternoon. She then has a rest for one hour.  She explains that when she awakes she feels replenished but experiences easy fatigability thereafter.  We spent some time discussing her future therapy. As mentioned above she is scheduled to complete radiation on 11/01/2011. We will then initiate Arimidex therapy beginning on 11/19/2011. This medication was E. scribed to her pharmacy. We spent a significant amount time the past discussing side effects of Arimidex including hot flashes, and arthralgias, and myalgias. She is well aware of these.  We spent some time discussing her anticoagulation. She was diagnosed on 05/10/2011 with a popliteal DVT. She has been on anticoagulation since that time. Since she will require her 6 months Worth of anticoagulation, her last dose of Coumadin will be on 11/04/2011. Beginning on April 1, she will not need to take any more Coumadin. She is certainly  pleased with this.  The patient reports that she is due for laboratory work on 11/08/2011. After reviewing her chart and the laboratory work that was ordered, we will perform this lab work today to prevent a subsequent trips to the clinic.  ROS: No TIA's or unusual headaches, no dysphagia.  No prolonged cough. No dyspnea or chest pain on exertion.  No abdominal pain, change in bowel habits, black or bloody stools.  No urinary tract symptoms.  No new or unusual musculoskeletal symptoms.  No abnormal vaginal bleeding, discharge or unexpected pelvic pain. No new breast lumps, breast pain or nipple discharge.   Past Medical History  Diagnosis Date  . DJD (degenerative joint disease)   . Hypertension   . Hypothyroidism   . Obesity   . Degenerative disc disease     with nerve compression   . Hypothyroidism   . Hypertension   . Cancer 2012    LEFT BREAST  . Clotting disorder 05/2011    dvt    has HYPOTHYROIDISM; HYPERLIPIDEMIA; OBESITY; HYPERTENSION; DEGENERATIVE JOINT DISEASE; INTERMITTENT VERTIGO; Cancer; Infiltrating ductal carcinoma of breast; and DVT (deep venous thrombosis) on her problem list.      has no known allergies.  Lisa Klein does not currently have medications on file.  Past Surgical History  Procedure Date  . Vesicovaginal fistula closure w/ tah     [facility]  . Cholecystectomy 80'S    [facility]  . Mastectomy modified radical 02/14/11    left  . Portacath placement 04/16/2011    Procedure: INSERTION PORT-A-CATH;  Surgeon: Dalia Heading;  Location: [facility];  Service: General;  Laterality: Right;  right subclavian  .  Cholecystectomy   . Breast surgery     left total mastectomy    Denies any headaches, dizziness, double vision, fevers, chills, night sweats, nausea, vomiting, diarrhea, constipation, chest pain, heart palpitations, shortness of breath, blood in stool, black tarry stool, urinary pain, urinary burning, urinary frequency, hematuria.   PHYSICAL EXAMINATION  ECOG  PERFORMANCE STATUS: 1 - Symptomatic but completely ambulatory  Filed Vitals:   10/31/11 0948  BP: 123/82  Pulse: 99  Temp: 97.8 F (36.6 C)    GENERAL:alert, no distress, well nourished, well developed, comfortable, cooperative, obese and smiling SKIN: skin color, texture, turgor are normal, no rashes or significant lesions HEAD: Normocephalic, No masses, lesions, tenderness or abnormalities EYES: normal, PERRLA, EOMI, Conjunctiva are pink and non-injected EARS: External ears normal OROPHARYNX:lips, buccal mucosa, and tongue normal and mucous membranes are moist  NECK: supple, no adenopathy, thyroid normal size, non-tender, without nodularity, no stridor, non-tender, trachea midline LYMPH:  no palpable lymphadenopathy, no hepatosplenomegaly BREAST:not examined LUNGS: clear to auscultation and percussion HEART: regular rate & rhythm, no murmurs, no gallops, S1 normal and S2 normal ABDOMEN:abdomen soft, non-tender and normal bowel sounds BACK: Back symmetric, no curvature., No CVA tenderness EXTREMITIES:less then 2 second capillary refill, no joint deformities, effusion, or inflammation, no edema, no skin discoloration, no clubbing, no cyanosis  NEURO: alert & oriented x 3 with fluent speech, no focal motor/sensory deficits, gait normal   PENDING LABS: CBC diff, CMET, PT/INR    PATHOLOGY: 02/14/2011  FINAL DIAGNOSIS  Diagnosis  Breast, modified radical mastectomy , left  - MULTIFOCAL INVASIVE DUCTAL CARCINOMA (FOUR FOCI), 1.5 CM LARGEST FOCUS,  GRADE III/III.  - INVOLVEMENT OF THE DEEP SURGICAL MARGIN OF RESECTION BY CARCINOMA.  - METASTATIC CARCINOMA IN SIX OUT OF SEVEN LEFT AXILLARY LYMPH NODES.  - SEE ONCOLOGY TABLE.  Microscopic Comment  BREAST, WITH LYMPH NODE SAMPLING  Specimen, including laterality: Left breast.  Procedure: Modified radical mastectomy.  Tumor size of largest invasive carcinoma (gross measurement or glass slide measurement): There are four  foci of  invasive carcinoma, measuring 1.5 cm, 1.5 cm, 1.0 cm and 0.7 cm.  Margins:  Invasive component, distance to closest margin: One of the foci around the area of the 3 o'clock  (lateral) involves the deep surgical margin of resection.  In situ component, distance to closest margin: Less than 0.1 cm from the deep surgical margin of  resection.  Lymph - Vascular invasion: Yes, prominent.  Histologic type, invasive component: Invasive ductal carcinoma in all foci.  Grade, invasive component (Nottingham combined histologic score): III (all foci).  Tubule formation grade: 3.  Nuclear pleomorphism grade: 3.  Mitotic grade: 2-3.  Ductal carcinoma in situ: Yes.  Nuclear grade: High grade.  Necrosis: Yes.  Extensive intraductal component: No.  Lobular neoplasia present: No.  Treatment effect (if treated with neoadjuvant therapy): Not applicable.  Multicentric (separate tumors in different quadrants): Yes.  Multifocal (separate tumors in same quadrant or biopsy): Yes.  1 of 3  FINAL for DRAYA, FELKER [phone])  Microscopic Comment(continued)  Macroscopic or microscopic extent of tumor:  Skin: No.  Nipple: No.  Skeletal muscle: Unknown.  Axillary lymph nodes:  Number examined: 7.  Number with metastasis: 6.  ITC (isolated tumor cells, < 0.57mm): 0.  Micrometastasis (> 0.7mm, < 2mm): 0.  Metastasis > 2 mm: 2.  Extracapsular extension: 4.  Method of detection of metastases: H&E.  TNM Code: pT1c, pN2a.  Breast Prognostic Markers: Case #[id number].  Estrogen receptor: Positive (100%).  Progesterone  receptor: Positive (14%).  Ki 67 (Mib-1): 45%.  HER-2/neu by CISH: No amplification detected. The ratio of HER-2:CEP 17 signals was 0.68.  Non-neoplastic breast: Unremarkable.  Comments: There are four foci of invasive ductal carcinoma with essentially identical morphology, as seen in  the lateral (3 o'clock) area and upper outer quadrant. One of the foci involves the deep surgical margin  of  resection, as seen in Block 1E. The foci are associated with high grade in situ ductal carcinoma with  associated necrosis in addition to prominent lymphovascular space invasion. HER-2/neu will be repeated on a  representative block. (BNS/eps 02/19/11)  Guerry Bruin MD  Pathologist, Electronic Signature  (Case signed 02/19/2011)  CHROMOGENIC IN-SITU HYBRIDIZATION  Interpretation  HER-2/NEU BY CISH - NO AMPLIFICATION OF HER-2 DETECTED. THE RATIO OF HER-2: CEP 17  SIGNALS WAS 0.97.  Reference range:  Ratio: HER2:CEP17 < 1.8 - gene amplification not observed  Ratio: HER2:CEP 17 1.8-2.2 - equivocal result  Ratio: HER2:CEP17 > 2.2 - gene amplification observed  Pecola Leisure MD  Pathologist, Electronic Signature  ( Signed 02/26/2011)    ASSESSMENT:  1. Stage IIIA breast cancer, S/P cycle 6 of EC and left modified radical mastectomy on 02/14/11.  She did experience some difficulties with chemotherapy and underwent a few dose reductions.  She is now undergoing radiation therapy and is scheduled to complete radiation on 11/01/2011. 2. DVT of right LE diagnosed on 05/10/2011. Presently on Coumadin anticoagulation. Will treat for 6 months    PLAN:  1. patient will complete radiation therapy as scheduled. 2. The patient will discontinue Coumadin anticoagulation on 11/05/2011. This will complete 6 months Worth of anticoagulation. 3. Laboratory work today: CBC with differential, complete metabolic panel, PT/INR 4. Patient education provided regarding Arimidex. Patient will begin this medication on 11/19/2011.  She will take Arimidex 1 mg daily.  5. Arimidex 1 mg #30 with 3 refills was E. scribed to her pharmacy. 6. Patient return at the end of May or beginning of June 2013 for followup. This will provide her with 4-6 weeks of Arimidex therapy. At that time we'll see how she is tolerating the medication. She understands she'll take Arimidex for 5 years.   All questions were answered. The patient  knows to call the clinic with any problems, questions or concerns. We can certainly see the patient much sooner if necessary.  The patient and plan discussed with Glenford Peers, MD and he is in agreement with the aforementioned.  Stephenson Cichy

## 2011-10-31 NOTE — Patient Instructions (Signed)
[facility]  Discharge Instructions  RECOMMENDATIONS MADE BY THE CONSULTANT AND ANY TEST RESULTS WILL BE SENT TO YOUR REFERRING DOCTOR.   MEDICATIONS PRESCRIBED: Arimidex 1mg  by mouth daily.    SPECIAL INSTRUCTIONS/FOLLOW-UP: Lab work Needed today and Return to Clinic in 4-6 weeks for follow-up on new medication, Arimidex.  You will be stopping your Coumadin November 04, 2011 and starting your Arimidex November 19, 2011.   I acknowledge that I have been informed and understand all the instructions given to me and received a copy. I do not have any more questions at this time, but understand that I may call the Specialty Clinic at [facility] at [phone] during business hours should I have any further questions or need assistance in obtaining follow-up care.    __________________________________________  _____________  __________ Signature of Patient or Authorized Representative            Date                   Time    __________________________________________ Nurse's Signature

## 2011-11-01 ENCOUNTER — Telehealth (HOSPITAL_COMMUNITY): Payer: Self-pay | Admitting: *Deleted

## 2011-11-01 NOTE — Telephone Encounter (Signed)
Message copied by Dennie Maizes on Thu Nov 01, 2011  1:31 PM ------      Message from: Ellouise Newer III      Created: Wed Oct 31, 2011  5:32 PM       A PT/INR was not done today.  But that is ok because she has been perfect and actually wasn't due for one until 11/08/11.  Tell her to continue the same dose and D/C Coumadin on April 1.

## 2011-11-01 NOTE — Telephone Encounter (Signed)
Spoke with pt as below. Pt verbalized understanding.

## 2011-11-08 ENCOUNTER — Other Ambulatory Visit (HOSPITAL_COMMUNITY): Payer: Medicare HMO

## 2011-11-14 ENCOUNTER — Other Ambulatory Visit (HOSPITAL_COMMUNITY): Payer: Self-pay | Admitting: Oncology

## 2011-11-14 DIAGNOSIS — C50919 Malignant neoplasm of unspecified site of unspecified female breast: Secondary | ICD-10-CM

## 2011-11-14 DIAGNOSIS — I82409 Acute embolism and thrombosis of unspecified deep veins of unspecified lower extremity: Secondary | ICD-10-CM

## 2011-11-14 MED ORDER — POTASSIUM CHLORIDE CRYS ER 10 MEQ PO TBCR: 10.0000 meq | EXTENDED_RELEASE_TABLET | Freq: Two times a day (BID) | ORAL | Status: DC

## 2011-11-14 NOTE — Progress Notes (Signed)
Labs drawn today

## 2011-11-23 NOTE — Progress Notes (Signed)
Labs drawn

## 2012-01-02 ENCOUNTER — Ambulatory Visit (HOSPITAL_COMMUNITY): Payer: Medicare HMO | Admitting: Oncology

## 2012-01-03 ENCOUNTER — Encounter (HOSPITAL_COMMUNITY): Payer: Medicare HMO | Attending: Oncology | Admitting: Oncology

## 2012-01-03 VITALS — BP 126/68 | HR 73 | Temp 97.1°F | Ht 66.0 in | Wt 182.0 lb

## 2012-01-03 DIAGNOSIS — I82409 Acute embolism and thrombosis of unspecified deep veins of unspecified lower extremity: Secondary | ICD-10-CM

## 2012-01-03 DIAGNOSIS — C50919 Malignant neoplasm of unspecified site of unspecified female breast: Secondary | ICD-10-CM

## 2012-01-03 DIAGNOSIS — M62838 Other muscle spasm: Secondary | ICD-10-CM

## 2012-01-03 LAB — CBC
Platelets: 156 10*3/uL (ref 150–400)
RBC: 3.96 MIL/uL (ref 3.87–5.11)
RDW: 15.1 % (ref 11.5–15.5)
WBC: 3.1 10*3/uL — ABNORMAL LOW (ref 4.0–10.5)

## 2012-01-03 LAB — DIFFERENTIAL
Eosinophils Absolute: 0.1 10*3/uL (ref 0.0–0.7)
Eosinophils Relative: 2 % (ref 0–5)
Lymphs Abs: 0.9 10*3/uL (ref 0.7–4.0)

## 2012-01-03 LAB — COMPREHENSIVE METABOLIC PANEL
ALT: 10 U/L (ref 0–35)
BUN: 12 mg/dL (ref 6–23)
Calcium: 9.7 mg/dL (ref 8.4–10.5)
GFR calc Af Amer: >90 mL/min (ref >90)
Glucose, Bld: 89 mg/dL (ref 70–99)
Sodium: 139 mEq/L (ref 135–145)
Total Protein: 6.6 g/dL (ref 6.0–8.3)

## 2012-01-03 MED ORDER — HEPARIN SOD (PORK) LOCK FLUSH 100 UNIT/ML IV SOLN
INTRAVENOUS | Status: AC
  Filled 2012-01-03: qty 5

## 2012-01-03 MED ORDER — CYCLOBENZAPRINE HCL 10 MG PO TABS: 10.0000 mg | ORAL_TABLET | Freq: Two times a day (BID) | ORAL | Status: AC | PRN

## 2012-01-03 MED ORDER — SODIUM CHLORIDE 0.9 % IJ SOLN
INTRAMUSCULAR | Status: AC
  Filled 2012-01-03: qty 10

## 2012-01-03 NOTE — Patient Instructions (Signed)
[facility]  Discharge Instructions  RECOMMENDATIONS MADE BY THE CONSULTANT AND ANY TEST RESULTS WILL BE SENT TO YOUR REFERRING DOCTOR.   EXAM FINDINGS BY MD TODAY AND SIGNS AND SYMPTOMS TO REPORT TO CLINIC OR PRIMARY MD:  You are doing well   MEDICATIONS PRESCRIBED: flexeril prescription given. You may also use advil or aleve   INSTRUCTIONS GIVEN AND DISCUSSED: You may do your yard work just wear gloves, long sleeves to protect your arm. Watch for swelling in that arm  SPECIAL INSTRUCTIONS/FOLLOW-UP: Port flush every 6 weeks appt with Tom in 3 months   I acknowledge that I have been informed and understand all the instructions given to me and received a copy. I do not have any more questions at this time, but understand that I may call the Specialty Clinic at [facility] at [phone] during business hours should I have any further questions or need assistance in obtaining follow-up care.

## 2012-01-03 NOTE — Progress Notes (Signed)
Lisa Overman, MD, MD [address], [address] [place] [address]  1. Infiltrating ductal carcinoma of breast     CURRENT THERAPY: Started Arimidex on November 19, 2011  INTERVAL HISTORY: Lisa Klein 78 y.o. female returns for  regular  visit for followup of  Stage III A invasive ductal carcinoma.  S/P radiation therapy.  S/P modified radical mastectomy on left on 02/14/11.   S/P 6 cycles of EC chemotherapy with a significant dose reduction followed by radiation.  Now on Arimidex starting on November 19, 2011.  The patient admits to muscles aching and joint aching but it is not debilitating.  She reports that her Right LE is worse than the left.  She notes it in her hands and wrists, but she admits that she has been manipulating her hands much more recently.  She does report hot flashes but these are few and not severe.  We discussed arthralgias, myalgias, and hot flashes as categorical side effects of the aromatase inhibitors.  She understands that we observe for debilitating side effects.  None of the above mentioned adverse reactions are severe and she is able to perform ADLs and IADLs appropriately.    She does reports some left sided muscle spasms that worsen with positional changes.  She denies any spinal pain.  She reports that the pain is exacerbated by rising from a sitting position.  She reports that the pain is alleviated by rest and avoiding exacerbating positional changes.  She reports that it is sharp pain and is noted inferior to left shoulder blade.  She denies any trauma.  It had a sudden onset.  We discussed muscle spasms secondary to muscle sprain.  I discussed the etiology of this discomfort and its symptomatic treatment.  She may take any over the counter anti-inflammatory including Ibuprofen and Aleve.  She may take this as directed on the bottle.  I will also provide her with an Rx for Flexeril.  We discussed the side effects of Flexeril including drowsiness and I thus  recommended she take this in the evening.   She asks is she should still have mammograms.  She is due for one this June she reports.  I recommended that she continue having yearly screening mammograms and advised her to have it done as scheduled in June.   She also asks about port removal.  I recommended keeping the port for 1 year after completing therapy (April 2014).  She is agreeable to this.   Oncologically, the patient denies any complaints other than those outlined above.     Past Medical History  Diagnosis Date  . DJD (degenerative joint disease)   . Hypertension   . Hypothyroidism   . Obesity   . Degenerative disc disease     with nerve compression   . Hypothyroidism   . Hypertension   . Cancer 2012    LEFT BREAST  . Clotting disorder 05/2011    dvt    has HYPOTHYROIDISM; HYPERLIPIDEMIA; OBESITY; HYPERTENSION; DEGENERATIVE JOINT DISEASE; INTERMITTENT VERTIGO; Cancer; Infiltrating ductal carcinoma of breast; and DVT (deep venous thrombosis) on her problem list.      has no known allergies.  Lisa Klein does not currently have medications on file.  Past Surgical History  Procedure Date  . Vesicovaginal fistula closure w/ tah     [facility]  . Cholecystectomy 80'S    [facility]  . Mastectomy modified radical 02/14/11    left  . Portacath placement 04/16/2011    Procedure:  INSERTION PORT-A-CATH;  Surgeon: Dalia Heading;  Location: [facility];  Service: General;  Laterality: Right;  right subclavian  . Cholecystectomy   . Breast surgery     left total mastectomy    Denies any headaches, dizziness, double vision, fevers, chills, night sweats, nausea, vomiting, diarrhea, constipation, chest pain, heart palpitations, shortness of breath, blood in stool, black tarry stool, urinary pain, urinary burning, urinary frequency, hematuria.   PHYSICAL EXAMINATION  ECOG PERFORMANCE STATUS: 1 - Symptomatic but completely ambulatory  Filed Vitals:   01/03/12 0946  BP: 126/68  Pulse: 73    Temp: 97.1 F (36.2 C)    GENERAL:alert, no distress, well nourished, well developed, comfortable, cooperative, obese and smiling SKIN: skin color, texture, turgor are normal, no rashes or significant lesions HEAD: Normocephalic, No masses, lesions, tenderness or abnormalities EYES: normal, Conjunctiva are pink and non-injected EARS: External ears normal OROPHARYNX:lips, buccal mucosa, and tongue normal and mucous membranes are moist  NECK: supple, no adenopathy, thyroid normal size, non-tender, without nodularity, no stridor, non-tender, trachea midline LYMPH:  no palpable lymphadenopathy, no hepatosplenomegaly BREAST:right breast normal without mass, skin or nipple changes or axillary nodes, left post-mastectomy site well healed and free of suspicious changes.  Some left sided hyperpigmentation noted. LUNGS: clear to auscultation and percussion HEART: regular rate & rhythm, no murmurs, no gallops, S1 normal and S2 normal ABDOMEN:abdomen soft, non-tender, obese, normal bowel sounds, no masses or organomegaly and no hepatosplenomegaly BACK: Back symmetric, no curvature., No CVA tenderness EXTREMITIES:less then 2 second capillary refill, no joint deformities, effusion, or inflammation, no edema, no skin discoloration, no clubbing, no cyanosis  NEURO: alert & oriented x 3 with fluent speech, no focal motor/sensory deficits, gait normal    PENDING LABS: CBC diff, CMET    PATHOLOGY: 02/14/2011  FINAL DIAGNOSIS  Diagnosis  Breast, modified radical mastectomy , left  - MULTIFOCAL INVASIVE DUCTAL CARCINOMA (FOUR FOCI), 1.5 CM LARGEST FOCUS,  GRADE III/III.  - INVOLVEMENT OF THE DEEP SURGICAL MARGIN OF RESECTION BY CARCINOMA.  - METASTATIC CARCINOMA IN SIX OUT OF SEVEN LEFT AXILLARY LYMPH NODES.  - SEE ONCOLOGY TABLE.  Microscopic Comment  BREAST, WITH LYMPH NODE SAMPLING  Specimen, including laterality: Left breast.  Procedure: Modified radical mastectomy.  Tumor size of largest  invasive carcinoma (gross measurement or glass slide measurement): There are four  foci of invasive carcinoma, measuring 1.5 cm, 1.5 cm, 1.0 cm and 0.7 cm.  Margins:  Invasive component, distance to closest margin: One of the foci around the area of the 3 o'clock  (lateral) involves the deep surgical margin of resection.  In situ component, distance to closest margin: Less than 0.1 cm from the deep surgical margin of  resection.  Lymph - Vascular invasion: Yes, prominent.  Histologic type, invasive component: Invasive ductal carcinoma in all foci.  Grade, invasive component (Nottingham combined histologic score): III (all foci).  Tubule formation grade: 3.  Nuclear pleomorphism grade: 3.  Mitotic grade: 2-3.  Ductal carcinoma in situ: Yes.  Nuclear grade: High grade.  Necrosis: Yes.  Extensive intraductal component: No.  Lobular neoplasia present: No.  Treatment effect (if treated with neoadjuvant therapy): Not applicable.  Multicentric (separate tumors in different quadrants): Yes.  Multifocal (separate tumors in same quadrant or biopsy): Yes.  1 of 3  FINAL for CRISTY, COLMENARES [phone])  Microscopic Comment(continued)  Macroscopic or microscopic extent of tumor:  Skin: No.  Nipple: No.  Skeletal muscle: Unknown.  Axillary lymph nodes:  Number examined: 7.  Number with metastasis: 6.  ITC (isolated tumor cells, < 0.86mm): 0.  Micrometastasis (> 0.59mm, < 2mm): 0.  Metastasis > 2 mm: 2.  Extracapsular extension: 4.  Method of detection of metastases: H&E.  TNM Code: pT1c, pN2a.  Breast Prognostic Markers: Case #[id number].  Estrogen receptor: Positive (100%).  Progesterone receptor: Positive (14%).  Ki 67 (Mib-1): 45%.  HER-2/neu by C[facility]: No amplification detected. The ratio of HER-2:CEP 17 signals was 0.68.  Non-neoplastic breast: Unremarkable.  Comments: There are four foci of invasive ductal carcinoma with essentially identical morphology, as seen in  the lateral (3  o'clock) area and upper outer quadrant. One of the foci involves the deep surgical margin of  resection, as seen in Block 1E. The foci are associated with high grade in situ ductal carcinoma with  associated necrosis in addition to prominent lymphovascular space invasion. HER-2/neu will be repeated on a  representative block. (BNS/eps 02/19/11)  Guerry Bruin MD  Pathologist, Electronic Signature  (Case signed 02/19/2011)  CHROMOGENIC IN-SITU HYBRIDIZATION  Interpretation  HER-2/NEU BY C[facility] - NO AMPLIFICATION OF HER-2 DETECTED. THE RATIO OF HER-2: CEP 17  SIGNALS WAS 0.97.  Reference range:  Ratio: HER2:CEP17 < 1.8 - gene amplification not observed  Ratio: HER2:CEP 17 1.8-2.2 - equivocal result  Ratio: HER2:CEP17 > 2.2 - gene amplification observed  Pecola Leisure MD  Pathologist, Electronic Signature  ( Signed 02/26/2011)       ASSESSMENT:  1. Stage IIIA breast cancer, S/P cycle 6 of EC and left modified radical mastectomy on 02/14/11. She did experience some difficulties with chemotherapy and underwent a few dose reductions. She is now undergoing radiation therapy and is scheduled to complete radiation on 11/01/2011.  2. DVT of right LE diagnosed on 05/10/2011. Presently on Coumadin anticoagulation. Will treat for 6 months  3. Muscle spasms, left trapezius/latissimus dorsi muscle affected/involved.    PLAN:  1. Lab work today: CBC diff, CMET 2. Port flush today and every 6 weeks 3. Mammogram in June 2013. 4. Continue Arimidex daily. 5. Anti-inflammatory as directed either Ibuprofen or Aleve for muscle spasms. 6. Flexeril 10 mg #20 with 0 refills Rx printed for the patient's muscle spasms.  Side effects of this medication was provided including drowsiness.  She may take this BID PRN but I recommended evening use only.  7. Return in 3 months for follow-up and then we will see her every 6 months.   All questions were answered. The patient knows to call the clinic with any problems,  questions or concerns. We can certainly see the patient much sooner if necessary.  Philisha Weinel

## 2012-01-03 NOTE — Progress Notes (Signed)
Lisa Klein presented for Portacath access and flush. Proper placement of portacath confirmed by CXR. Portacath located rt chest wall accessed with  H 20 needle. Good blood return present. Portacath flushed with 20ml NS and 500U/5ml Heparin and needle removed intact. Procedure without incident. Patient tolerated procedure well.   

## 2012-01-14 ENCOUNTER — Other Ambulatory Visit: Payer: Self-pay | Admitting: Family Medicine

## 2012-01-21 ENCOUNTER — Other Ambulatory Visit (HOSPITAL_COMMUNITY): Payer: Self-pay | Admitting: Oncology

## 2012-01-21 DIAGNOSIS — Z139 Encounter for screening, unspecified: Secondary | ICD-10-CM

## 2012-02-13 ENCOUNTER — Ambulatory Visit (HOSPITAL_COMMUNITY)
Admission: RE | Admit: 2012-02-13 | Discharge: 2012-02-13 | Disposition: A | Discharge status: Home / Self Care | Payer: Medicare HMO | Source: Ambulatory Visit | Attending: Oncology | Admitting: Oncology

## 2012-02-13 ENCOUNTER — Other Ambulatory Visit (HOSPITAL_COMMUNITY): Payer: Self-pay | Admitting: Oncology

## 2012-02-13 DIAGNOSIS — Z139 Encounter for screening, unspecified: Secondary | ICD-10-CM

## 2012-02-13 DIAGNOSIS — Z1231 Encounter for screening mammogram for malignant neoplasm of breast: Secondary | ICD-10-CM | POA: Insufficient documentation

## 2012-02-14 ENCOUNTER — Other Ambulatory Visit: Payer: Self-pay | Admitting: Family Medicine

## 2012-02-14 ENCOUNTER — Encounter (HOSPITAL_COMMUNITY): Payer: Medicare HMO | Attending: Oncology

## 2012-02-14 DIAGNOSIS — Z452 Encounter for adjustment and management of vascular access device: Secondary | ICD-10-CM

## 2012-02-14 DIAGNOSIS — C50919 Malignant neoplasm of unspecified site of unspecified female breast: Secondary | ICD-10-CM | POA: Insufficient documentation

## 2012-02-14 MED ORDER — SODIUM CHLORIDE 0.9 % IJ SOLN
10.0000 mL | Freq: Once | INTRAMUSCULAR | Status: AC
  Administered 2012-02-14: 10 mL via INTRAVENOUS
  Filled 2012-02-14: qty 10

## 2012-02-14 MED ORDER — SODIUM CHLORIDE 0.9 % IJ SOLN
INTRAMUSCULAR | Status: AC
  Filled 2012-02-14: qty 10

## 2012-02-14 MED ORDER — HEPARIN SOD (PORK) LOCK FLUSH 100 UNIT/ML IV SOLN
INTRAVENOUS | Status: AC
  Filled 2012-02-14: qty 5

## 2012-02-14 MED ORDER — HEPARIN SOD (PORK) LOCK FLUSH 100 UNIT/ML IV SOLN
500.0000 [IU] | Freq: Once | INTRAVENOUS | Status: AC
  Administered 2012-02-14: 500 [IU] via INTRAVENOUS
  Filled 2012-02-14: qty 5

## 2012-02-14 NOTE — Progress Notes (Signed)
America G Pipe presented for Portacath access and flush. Proper placement of portacath confirmed by CXR. Portacath located right chest wall accessed with  H 20 needle. Good blood return present. Portacath flushed with 20ml NS and 500U/5ml Heparin and needle removed intact. Procedure without incident. Patient tolerated procedure well.   

## 2012-02-26 ENCOUNTER — Other Ambulatory Visit: Payer: Self-pay

## 2012-02-26 ENCOUNTER — Encounter: Payer: Self-pay | Admitting: Family Medicine

## 2012-02-26 ENCOUNTER — Ambulatory Visit (INDEPENDENT_AMBULATORY_CARE_PROVIDER_SITE_OTHER): Payer: Medicare HMO | Admitting: Family Medicine

## 2012-02-26 ENCOUNTER — Encounter (INDEPENDENT_AMBULATORY_CARE_PROVIDER_SITE_OTHER): Payer: Self-pay | Admitting: *Deleted

## 2012-02-26 VITALS — BP 130/82 | HR 86 | Resp 18 | Ht 66.0 in | Wt 183.0 lb

## 2012-02-26 DIAGNOSIS — Z0000 Encounter for general adult medical examination without abnormal findings: Secondary | ICD-10-CM

## 2012-02-26 DIAGNOSIS — I82409 Acute embolism and thrombosis of unspecified deep veins of unspecified lower extremity: Secondary | ICD-10-CM

## 2012-02-26 DIAGNOSIS — E785 Hyperlipidemia, unspecified: Secondary | ICD-10-CM

## 2012-02-26 DIAGNOSIS — Z1211 Encounter for screening for malignant neoplasm of colon: Secondary | ICD-10-CM

## 2012-02-26 DIAGNOSIS — M1990 Unspecified osteoarthritis, unspecified site: Secondary | ICD-10-CM

## 2012-02-26 DIAGNOSIS — E039 Hypothyroidism, unspecified: Secondary | ICD-10-CM

## 2012-02-26 DIAGNOSIS — C50919 Malignant neoplasm of unspecified site of unspecified female breast: Secondary | ICD-10-CM

## 2012-02-26 DIAGNOSIS — I10 Essential (primary) hypertension: Secondary | ICD-10-CM

## 2012-02-26 LAB — LIPID PANEL: Cholesterol: 152 mg/dL (ref 0–200)

## 2012-02-26 LAB — TSH: TSH: 1.978 u[IU]/mL (ref 0.350–4.500)

## 2012-02-26 MED ORDER — POTASSIUM CHLORIDE CRYS ER 10 MEQ PO TBCR: 10.0000 meq | EXTENDED_RELEASE_TABLET | Freq: Two times a day (BID) | ORAL | Status: DC

## 2012-02-26 MED ORDER — HYDROCHLOROTHIAZIDE 12.5 MG PO CAPS: ORAL_CAPSULE | ORAL | Status: DC

## 2012-02-26 NOTE — Progress Notes (Signed)
Subjective:    Patient ID: Lisa Klein, female    DOB: 09-01-1929, 78 y.o.   MRN: [id number]  HPI Preventive Screening-Counseling & Management   Patient present here today for a Medicare annual wellness visit.  Chronic medical conditions are also evaluated, and recent lab data reviewed   Current Problems (verified)   Medications Prior to Visit Allergies (verified)   PAST HISTORY  Family History  Social History    Risk Factors  Current exercise habits:  No regular exercise currently , encourage pt to start slowly  Dietary issues discussed:yes and documented   Cardiac risk factors:   Depression Screen  (Note: if answer to either of the following is "Yes", a more complete depression screening is indicated)   Over the past two weeks, have you felt down, depressed or hopeless? No  Over the past two weeks, have you felt little interest or pleasure in doing things? No  Have you lost interest or pleasure in daily life? No  Do you often feel hopeless? No  Do you cry easily over simple problems? No   Activities of Daily Living  In your present state of health, do you have any difficulty performing the following activities?  Driving?:able to drive around town, short distances Managing money?: No Feeding yourself?:No Getting from bed to chair?:No Climbing a flight of stairs?:No Preparing food and eating?:No Bathing or showering?:No Getting dressed?:No Getting to the toilet?:No Using the toilet?:No Moving around from place to place?: No  Fall Risk Assessment In the past year have you fallen or had a near fall?:No Are you currently taking any medications that make you dizziness?:No   Hearing Difficulties: No Do you often ask people to speak up or repeat themselves?:No Do you experience ringing or noises in your ears?:No Do you have difficulty understanding soft or whispered voices?:sometimes  Cognitive Testing  Alert? Yes Normal Appearance?Yes  Oriented to  person? Yes Place? Yes  Time? Yes  Displays appropriate judgment?Yes  Can read the correct time from a watch face? yes Are you having problems remembering things?sometimes  Advanced Directives have been discussed with the patient?Yes    List the Names of Other Physician/Practitioners you currently use: Dr Mariel Sleet and Dr. Lindon Romp any recent Medical Services you may have received from other than [facility] providers in the past year (date may be approximate).   Assessment:    Annual Wellness Exam   Plan:    During the course of the visit the patient was educated and counseled about appropriate screening and preventive services including:  A healthy diet is rich in fruit, vegetables and whole grains. Poultry fish, nuts and beans are a healthy choice for protein rather then red meat. A low sodium diet and drinking 64 ounces of water daily is generally recommended. Oils and sweet should be limited. Carbohydrates especially for those who are diabetic or overweight, should be limited to 30-45 gram per meal. It is important to eat on a regular schedule, at least 3 times daily. Snacks should be primarily fruits, vegetables or nuts. It is important that you exercise regularly at least 30 minutes 5 times a week. If you develop chest pain, have severe difficulty breathing, or feel very tired, stop exercising immediately and seek medical attention  Immunization reviewed and updated. Cancer screening reviewed and updated, plans to have colonoscopy in the fall, just recovering from chemo and radiation therapy for breast cancer    Patient Instructions (the written plan) was given to the patient.  Medicare Attestation  I have personally reviewed:  The patient's medical and social history  Their use of alcohol, tobacco or illicit drugs  Their current medications and supplements  The patient's functional ability including ADLs,fall risks, home safety risks, cognitive, and hearing and visual  impairment  Diet and physical activities  Evidence for depression or mood disorders  The patient's weight, height, BMI, and visual acuity have been recorded in the chart. I have made referrals, counseling, and provided education to the patient based on review of the above and I have provided the patient with a written personalized care plan for preventive services.      Review of Systems     Objective:   Physical Exam        Assessment & Plan:

## 2012-02-26 NOTE — Patient Instructions (Addendum)
F/u in 4.5 month, call if you need me before  You are referred for screening colonoscopy in September with Dr Karilyn Cota  I am thankful that you are much better following treatment for breast cancer, your attitude makes a whole lot of difference, and yours is to be followed.  Please consider further the discussion on end of life issues and carry this out with your children also   You need to call for appointments for eye exam and dental exam  TSH, vit D, lipids today  tSH in 4.5 month

## 2012-03-01 NOTE — Assessment & Plan Note (Signed)
Controlled, no change in medication Hyperlipidemia:Low fat diet discussed and encouraged.  \ 

## 2012-03-01 NOTE — Assessment & Plan Note (Signed)
Annual wellness exam done at this visit and documented in body of note

## 2012-03-01 NOTE — Assessment & Plan Note (Signed)
Controlled, no change in medication DASH diet and commitment to daily physical activity for a minimum of 30 minutes discussed and encouraged, as a part of hypertension management. The importance of attaining a healthy weight is also discussed.  

## 2012-03-01 NOTE — Assessment & Plan Note (Signed)
Controlled, no change in medication  

## 2012-03-17 ENCOUNTER — Other Ambulatory Visit: Payer: Self-pay | Admitting: Family Medicine

## 2012-03-21 ENCOUNTER — Other Ambulatory Visit (HOSPITAL_COMMUNITY): Payer: Self-pay | Admitting: Oncology

## 2012-03-21 DIAGNOSIS — C50919 Malignant neoplasm of unspecified site of unspecified female breast: Secondary | ICD-10-CM

## 2012-03-21 MED ORDER — ANASTROZOLE 1 MG PO TABS: 1.0000 mg | ORAL_TABLET | Freq: Every day | ORAL | Status: DC

## 2012-03-27 ENCOUNTER — Encounter (HOSPITAL_COMMUNITY): Payer: Medicare HMO

## 2012-04-03 ENCOUNTER — Encounter (HOSPITAL_COMMUNITY): Payer: Self-pay | Admitting: Oncology

## 2012-04-03 ENCOUNTER — Encounter (HOSPITAL_COMMUNITY): Payer: Medicare HMO | Attending: Oncology | Admitting: Oncology

## 2012-04-03 ENCOUNTER — Encounter (HOSPITAL_COMMUNITY): Payer: Medicare HMO

## 2012-04-03 VITALS — BP 131/74 | HR 78 | Temp 97.9°F | Resp 18 | Wt 184.2 lb

## 2012-04-03 DIAGNOSIS — Z9889 Other specified postprocedural states: Secondary | ICD-10-CM | POA: Insufficient documentation

## 2012-04-03 DIAGNOSIS — C50419 Malignant neoplasm of upper-outer quadrant of unspecified female breast: Secondary | ICD-10-CM

## 2012-04-03 DIAGNOSIS — Z86718 Personal history of other venous thrombosis and embolism: Secondary | ICD-10-CM

## 2012-04-03 DIAGNOSIS — C50919 Malignant neoplasm of unspecified site of unspecified female breast: Secondary | ICD-10-CM

## 2012-04-03 DIAGNOSIS — Z95828 Presence of other vascular implants and grafts: Secondary | ICD-10-CM

## 2012-04-03 MED ORDER — SODIUM CHLORIDE 0.9 % IJ SOLN
INTRAMUSCULAR | Status: AC
  Filled 2012-04-03: qty 10

## 2012-04-03 MED ORDER — HEPARIN SOD (PORK) LOCK FLUSH 100 UNIT/ML IV SOLN
INTRAVENOUS | Status: AC
  Filled 2012-04-03: qty 5

## 2012-04-03 MED ORDER — HEPARIN SOD (PORK) LOCK FLUSH 100 UNIT/ML IV SOLN
500.0000 [IU] | Freq: Once | INTRAVENOUS | Status: AC
  Filled 2012-04-03: qty 5

## 2012-04-03 MED ORDER — SODIUM CHLORIDE 0.9 % IJ SOLN
10.0000 mL | INTRAMUSCULAR | Status: AC | PRN
  Filled 2012-04-03: qty 10

## 2012-04-03 NOTE — Patient Instructions (Signed)
Lisa Klein  DOB 12-04-1929 CSN [id number]  MRN [id number] Dr. Glenford Peers  [facility]  Discharge Instructions  RECOMMENDATIONS MADE BY THE CONSULTANT AND ANY TEST RESULTS WILL BE SENT TO YOUR REFERRING DOCTOR.   Port flush done today.  Port flush due again in 8 weeks. Return to clinic in 6 months to see doctor. Report any issues/concerns to clinic prior to then if needed.   I acknowledge that I have been informed and understand all the instructions given to me and received a copy. I do not have any more questions at this time, but understand that I may call the Specialty Clinic at [facility] at [phone] during business hours should I have any further questions or need assistance in obtaining follow-up care.    __________________________________________  _____________  __________ Signature of Patient or Authorized Representative            Date                   Time    __________________________________________ Nurse's Signature

## 2012-04-03 NOTE — Progress Notes (Signed)
Lisa Klein presented for Portacath access and flush. Proper placement of portacath confirmed by CXR. Portacath located right chest wall accessed with  H 20 needle. Good blood return present. Portacath flushed with 20ml NS and 500U/5ml Heparin and needle removed intact. Procedure without incident. Patient tolerated procedure well.   

## 2012-04-03 NOTE — Progress Notes (Signed)
Lisa Overman, MD [address] [place] [address]  1. Infiltrating ductal carcinoma of breast     CURRENT THERAPY: Started Arimidex on November 19, 2011  INTERVAL HISTORY: Lisa Klein 78 y.o. female returns for  regular  visit for followup of Stage III A invasive ductal carcinoma. S/P radiation therapy. S/P modified radical mastectomy on left on 02/14/11. S/P 6 cycles of EC chemotherapy with a significant dose reduction followed by radiation. Now on Arimidex starting on November 19, 2011.   Lisa Klein is doing well.  She is tolerating her Arimidex well.  She does admit to occasional hot flashes, but these are manageable.  She also reports some arthralgias in the right hip and B/L knees.  She has had this for a while, even before systemic chemotherapy.  She cannot say it is better or worse or the same since starting Arimidex.  She does report arthritis in these areas as well.  It is not debilitating and keeping her in bed.  She is ambulating with the assistance of a 4 pronged cane.      She is concerned with her medical bills which continue to flood her.  She reports that she is due to see Dr. Michell Heinrich ([facility]) in October and she is considering not going to this appointment.  I have encouraged her to maintain this appointment.  She may be able to be released from [facility] at that time.  She will be required to continue follow-up with [place].    Her PCP has recommended a colonoscopy.  i validated this recommendation.   I personally reviewed and went over laboratory results with the patient. I personally reviewed and went over radiographic studies with the patient.  Her mammogram was benign.   ROS questioning is otherwise negative.   Past Medical History  Diagnosis Date  . DJD (degenerative joint disease)   . Hypertension   . Hypothyroidism   . Obesity   . Degenerative disc disease     with nerve compression   . Hypothyroidism   . Hypertension   . Clotting disorder 05/2011   dvt, right leg  . Cancer 2012    LEFT BREAST    has HYPOTHYROIDISM; HYPERLIPIDEMIA; OBESITY; HYPERTENSION; DEGENERATIVE JOINT DISEASE; INTERMITTENT VERTIGO; Cancer; Infiltrating ductal carcinoma of breast; DVT (deep venous thrombosis); and Routine general medical examination at a health care facility on her problem list.      has no known allergies.  Lisa Klein does not currently have medications on file.  Past Surgical History  Procedure Date  . Vesicovaginal fistula closure w/ tah     [facility]  . Cholecystectomy 80'S    [facility]  . Mastectomy modified radical 02/14/11    left  . Portacath placement 04/16/2011    Procedure: INSERTION PORT-A-CATH;  Surgeon: Dalia Heading;  Location: [facility];  Service: General;  Laterality: Right;  right subclavian  . Cholecystectomy   . Breast surgery     left total mastectomy    Denies any headaches, dizziness, double vision, fevers, chills, night sweats, nausea, vomiting, diarrhea, constipation, chest pain, heart palpitations, shortness of breath, blood in stool, black tarry stool, urinary pain, urinary burning, urinary frequency, hematuria.   PHYSICAL EXAMINATION  ECOG PERFORMANCE STATUS: 1 - Symptomatic but completely ambulatory  Filed Vitals:   04/03/12 1030  BP: 131/74  Pulse: 78  Temp: 97.9 F (36.6 C)  Resp: 18    GENERAL:alert, no distress, well nourished, well developed, comfortable, cooperative, obese and  smiling SKIN: skin color, texture, turgor are normal, no rashes or significant lesions HEAD: Normocephalic, No masses, lesions, tenderness or abnormalities EYES: normal, Conjunctiva are pink and non-injected EARS: External ears normal OROPHARYNX:lips, buccal mucosa, and tongue normal and mucous membranes are moist  NECK: supple, no adenopathy, thyroid normal size, non-tender, without nodularity, no stridor, non-tender, trachea midline LYMPH:  no palpable lymphadenopathy, no hepatosplenomegaly BREAST:right breast normal without mass,  skin or nipple changes or axillary nodes, left post-mastectomy site well healed and free of suspicious changes LUNGS: clear to auscultation and percussion HEART: regular rate & rhythm, no murmurs, no gallops, S1 normal and S2 normal ABDOMEN:abdomen soft, non-tender, obese, normal bowel sounds, no masses or organomegaly and no hepatosplenomegaly BACK: Back symmetric, no curvature., No CVA tenderness EXTREMITIES:less then 2 second capillary refill, no joint deformities, effusion, or inflammation, no edema, no skin discoloration, no clubbing, no cyanosis  NEURO: alert & oriented x 3 with fluent speech, no focal motor/sensory deficits   LABORATORY DATA: CBC    Component Value Date/Time   WBC 3.1* 01/03/2012 1100   RBC 3.96 01/03/2012 1100   HGB 11.5* 01/03/2012 1100   HCT 34.9* 01/03/2012 1100   PLT 156 01/03/2012 1100   MCV 88.1 01/03/2012 1100   MCH 29.0 01/03/2012 1100   MCHC 33.0 01/03/2012 1100   RDW 15.1 01/03/2012 1100   LYMPHSABS 0.9 01/03/2012 1100   MONOABS 0.4 01/03/2012 1100   EOSABS 0.1 01/03/2012 1100   BASOSABS 0.0 01/03/2012 1100      Chemistry      Component Value Date/Time   NA 139 01/03/2012 1100   K 3.5 01/03/2012 1100   CL 102 01/03/2012 1100   CO2 27 01/03/2012 1100   BUN 12 01/03/2012 1100   CREATININE 0.71 01/03/2012 1100   CREATININE 0.70 09/26/2011 0955      Component Value Date/Time   CALCIUM 9.7 01/03/2012 1100   ALKPHOS 61 01/03/2012 1100   AST 17 01/03/2012 1100   ALT 10 01/03/2012 1100   BILITOT 0.6 01/03/2012 1100        RADIOGRAPHIC STUDIES:  02/13/2012  *RADIOLOGY REPORT*  Clinical Data: Screening.  RIGHT DIGITAL SCREENING MAMMOGRAM WITH CAD  Comparison: None  Findings: The patient has had a left mastectomy. Views of the  right breast demonstrate heterogeneously dense tissue. No  suspicious masses, architectural distortion, or calcifications are  present.  Images were processed with CAD.  IMPRESSION:  No evidence of malignancy. Screening mammography  is recommended in  one year.  BI-RADS CATEGORY 1: Negative  Original Report Authenticated By: Darrol Angel, M.D.    PATHOLOGY: 02/14/2011  FINAL DIAGNOSIS  Diagnosis  Breast, modified radical mastectomy , left  - MULTIFOCAL INVASIVE DUCTAL CARCINOMA (FOUR FOCI), 1.5 CM LARGEST FOCUS,  GRADE III/III.  - INVOLVEMENT OF THE DEEP SURGICAL MARGIN OF RESECTION BY CARCINOMA.  - METASTATIC CARCINOMA IN SIX OUT OF SEVEN LEFT AXILLARY LYMPH NODES.  - SEE ONCOLOGY TABLE.  Microscopic Comment  BREAST, WITH LYMPH NODE SAMPLING  Specimen, including laterality: Left breast.  Procedure: Modified radical mastectomy.  Tumor size of largest invasive carcinoma (gross measurement or glass slide measurement): There are four  foci of invasive carcinoma, measuring 1.5 cm, 1.5 cm, 1.0 cm and 0.7 cm.  Margins:  Invasive component, distance to closest margin: One of the foci around the area of the 3 o'clock  (lateral) involves the deep surgical margin of resection.  In situ component, distance to closest margin: Less than 0.1 cm from the deep  surgical margin of  resection.  Lymph - Vascular invasion: Yes, prominent.  Histologic type, invasive component: Invasive ductal carcinoma in all foci.  Grade, invasive component (Nottingham combined histologic score): III (all foci).  Tubule formation grade: 3.  Nuclear pleomorphism grade: 3.  Mitotic grade: 2-3.  Ductal carcinoma in situ: Yes.  Nuclear grade: High grade.  Necrosis: Yes.  Extensive intraductal component: No.  Lobular neoplasia present: No.  Treatment effect (if treated with neoadjuvant therapy): Not applicable.  Multicentric (separate tumors in different quadrants): Yes.  Multifocal (separate tumors in same quadrant or biopsy): Yes.  1 of 3  FINAL for TANARA, TURVEY [phone])  Microscopic Comment(continued)  Macroscopic or microscopic extent of tumor:  Skin: No.  Nipple: No.  Skeletal muscle: Unknown.  Axillary lymph nodes:    Number examined: 7.  Number with metastasis: 6.  ITC (isolated tumor cells, < 0.22mm): 0.  Micrometastasis (> 0.6mm, < 2mm): 0.  Metastasis > 2 mm: 2.  Extracapsular extension: 4.  Method of detection of metastases: H&E.  TNM Code: pT1c, pN2a.  Breast Prognostic Markers: Case #[id number].  Estrogen receptor: Positive (100%).  Progesterone receptor: Positive (14%).  Ki 67 (Mib-1): 45%.  HER-2/neu by CISH: No amplification detected. The ratio of HER-2:CEP 17 signals was 0.68.  Non-neoplastic breast: Unremarkable.  Comments: There are four foci of invasive ductal carcinoma with essentially identical morphology, as seen in  the lateral (3 o'clock) area and upper outer quadrant. One of the foci involves the deep surgical margin of  resection, as seen in Block 1E. The foci are associated with high grade in situ ductal carcinoma with  associated necrosis in addition to prominent lymphovascular space invasion. HER-2/neu will be repeated on a  representative block. (BNS/eps 02/19/11)  Guerry Bruin MD  Pathologist, Electronic Signature  (Case signed 02/19/2011)  CHROMOGENIC IN-SITU HYBRIDIZATION  Interpretation  HER-2/NEU BY CISH - NO AMPLIFICATION OF HER-2 DETECTED. THE RATIO OF HER-2: CEP 17  SIGNALS WAS 0.97.  Reference range:  Ratio: HER2:CEP17 < 1.8 - gene amplification not observed  Ratio: HER2:CEP 17 1.8-2.2 - equivocal result  Ratio: HER2:CEP17 > 2.2 - gene amplification observed  Pecola Leisure MD  Pathologist, Electronic Signature  ( Signed 02/26/2011)     ASSESSMENT:  1. Stage IIIA breast cancer, S/P cycle 6 of EC and left modified radical mastectomy on 02/14/11. She did experience some difficulties with chemotherapy and underwent a few dose reductions. She is now undergoing radiation therapy and is scheduled to complete radiation on 11/01/2011.  2. DVT of right LE diagnosed on 05/10/2011. S/P 6 months of anticoagulation    PLAN:  1. I personally reviewed and went over laboratory  results with the patient. 2. I personally reviewed and went over radiographic studies with the patient. 3. Continue with Arimidex daily 4. Lab work in 6 months: CBC diff, CMET 5. Patient may have port removed next year if she desires.  She completed chemotherapy Jan 2013. 6. Mammogram due June 2014 7. Patient encouraged to have colonoscopy by PCP and I validated this recommendation. 8. Return in 6 months for follow-up.   All questions were answered. The patient knows to call the clinic with any problems, questions or concerns. We can certainly see the patient much sooner if necessary.  Katherina Wimer

## 2012-05-28 ENCOUNTER — Telehealth: Payer: Self-pay | Admitting: Family Medicine

## 2012-05-28 ENCOUNTER — Other Ambulatory Visit (HOSPITAL_COMMUNITY): Payer: Self-pay | Admitting: Oncology

## 2012-05-28 DIAGNOSIS — C50919 Malignant neoplasm of unspecified site of unspecified female breast: Secondary | ICD-10-CM

## 2012-05-28 MED ORDER — ANASTROZOLE 1 MG PO TABS: 1.0000 mg | ORAL_TABLET | Freq: Every day | ORAL | Status: DC

## 2012-05-29 ENCOUNTER — Encounter (HOSPITAL_COMMUNITY): Payer: Medicare HMO | Attending: Oncology

## 2012-05-29 DIAGNOSIS — C50919 Malignant neoplasm of unspecified site of unspecified female breast: Secondary | ICD-10-CM | POA: Insufficient documentation

## 2012-05-29 DIAGNOSIS — Z452 Encounter for adjustment and management of vascular access device: Secondary | ICD-10-CM

## 2012-05-29 MED ORDER — HEPARIN SOD (PORK) LOCK FLUSH 100 UNIT/ML IV SOLN
500.0000 [IU] | Freq: Once | INTRAVENOUS | Status: AC
  Administered 2012-05-29: 500 [IU] via INTRAVENOUS
  Filled 2012-05-29: qty 5

## 2012-05-29 MED ORDER — HEPARIN SOD (PORK) LOCK FLUSH 100 UNIT/ML IV SOLN
INTRAVENOUS | Status: AC
  Filled 2012-05-29: qty 5

## 2012-05-29 MED ORDER — SODIUM CHLORIDE 0.9 % IJ SOLN
INTRAMUSCULAR | Status: AC
  Filled 2012-05-29: qty 10

## 2012-05-29 NOTE — Progress Notes (Signed)
Lisa Klein presented for Portacath access and flush. Proper placement of portacath confirmed by CXR. Portacath located rt chest wall accessed with  H 20 needle. Good blood return present. Portacath flushed with 20ml NS and 500U/5ml Heparin and needle removed intact. Procedure without incident. Patient tolerated procedure well.   

## 2012-05-29 NOTE — Telephone Encounter (Signed)
Patient is changing pharmacies to [facility].  Will send all meds filled by this office to that pharmacy.

## 2012-06-02 ENCOUNTER — Other Ambulatory Visit: Payer: Self-pay

## 2012-06-02 DIAGNOSIS — C50919 Malignant neoplasm of unspecified site of unspecified female breast: Secondary | ICD-10-CM

## 2012-06-02 DIAGNOSIS — I82409 Acute embolism and thrombosis of unspecified deep veins of unspecified lower extremity: Secondary | ICD-10-CM

## 2012-06-02 MED ORDER — HYDROCHLOROTHIAZIDE 12.5 MG PO CAPS: ORAL_CAPSULE | ORAL | Status: DC

## 2012-06-02 MED ORDER — POTASSIUM CHLORIDE CRYS ER 10 MEQ PO TBCR: 10.0000 meq | EXTENDED_RELEASE_TABLET | Freq: Two times a day (BID) | ORAL | Status: DC

## 2012-06-02 MED ORDER — LOVASTATIN 20 MG PO TABS: 20.0000 mg | ORAL_TABLET | Freq: Every day | ORAL | Status: DC

## 2012-06-02 MED ORDER — LEVOTHYROXINE SODIUM 100 MCG PO TABS: 100.0000 ug | ORAL_TABLET | Freq: Every day | ORAL | Status: DC

## 2012-06-02 NOTE — Telephone Encounter (Signed)
Hctz, lovastatin, potassium, and synthroid refilled through [facility]

## 2012-07-10 ENCOUNTER — Encounter (HOSPITAL_COMMUNITY): Payer: Medicare HMO | Attending: Oncology

## 2012-07-10 DIAGNOSIS — Z95828 Presence of other vascular implants and grafts: Secondary | ICD-10-CM

## 2012-07-10 DIAGNOSIS — Z452 Encounter for adjustment and management of vascular access device: Secondary | ICD-10-CM

## 2012-07-10 DIAGNOSIS — C773 Secondary and unspecified malignant neoplasm of axilla and upper limb lymph nodes: Secondary | ICD-10-CM

## 2012-07-10 DIAGNOSIS — C50919 Malignant neoplasm of unspecified site of unspecified female breast: Secondary | ICD-10-CM

## 2012-07-10 DIAGNOSIS — Z9889 Other specified postprocedural states: Secondary | ICD-10-CM | POA: Insufficient documentation

## 2012-07-10 MED ORDER — SODIUM CHLORIDE 0.9 % IJ SOLN
10.0000 mL | INTRAMUSCULAR | Status: DC | PRN
  Administered 2012-07-10: 10 mL via INTRAVENOUS
  Filled 2012-07-10: qty 10

## 2012-07-10 MED ORDER — HEPARIN SOD (PORK) LOCK FLUSH 100 UNIT/ML IV SOLN
INTRAVENOUS | Status: AC
  Filled 2012-07-10: qty 5

## 2012-07-10 MED ORDER — HEPARIN SOD (PORK) LOCK FLUSH 100 UNIT/ML IV SOLN
500.0000 [IU] | Freq: Once | INTRAVENOUS | Status: AC
  Administered 2012-07-10: 500 [IU] via INTRAVENOUS
  Filled 2012-07-10: qty 5

## 2012-07-10 NOTE — Progress Notes (Signed)
Marquis Buggy presented for Portacath access and flush. Proper placement of portacath confirmed by CXR. Portacath located rt chest wall accessed with  H 20 needle. Good blood return present. Portacath flushed with 20ml NS and 500U/91ml Heparin and needle removed intact. Procedure without incident. Patient tolerated procedure well.

## 2012-07-17 ENCOUNTER — Encounter: Payer: Self-pay | Admitting: Family Medicine

## 2012-07-17 ENCOUNTER — Ambulatory Visit (INDEPENDENT_AMBULATORY_CARE_PROVIDER_SITE_OTHER): Payer: Medicare HMO | Admitting: Family Medicine

## 2012-07-17 VITALS — BP 140/84 | HR 80 | Resp 18 | Ht 66.0 in | Wt 194.0 lb

## 2012-07-17 DIAGNOSIS — I10 Essential (primary) hypertension: Secondary | ICD-10-CM

## 2012-07-17 DIAGNOSIS — C50919 Malignant neoplasm of unspecified site of unspecified female breast: Secondary | ICD-10-CM

## 2012-07-17 DIAGNOSIS — M1990 Unspecified osteoarthritis, unspecified site: Secondary | ICD-10-CM

## 2012-07-17 DIAGNOSIS — N3000 Acute cystitis without hematuria: Secondary | ICD-10-CM

## 2012-07-17 DIAGNOSIS — E039 Hypothyroidism, unspecified: Secondary | ICD-10-CM

## 2012-07-17 DIAGNOSIS — E785 Hyperlipidemia, unspecified: Secondary | ICD-10-CM

## 2012-07-17 DIAGNOSIS — I82409 Acute embolism and thrombosis of unspecified deep veins of unspecified lower extremity: Secondary | ICD-10-CM

## 2012-07-17 LAB — POCT URINALYSIS DIPSTICK
Bilirubin, UA: NEGATIVE
Glucose, UA: NEGATIVE
Protein, UA: NEGATIVE
Urobilinogen, UA: 0.2
pH, UA: 7

## 2012-07-17 LAB — TSH: TSH: 1.9 u[IU]/mL (ref 0.350–4.500)

## 2012-07-17 LAB — BASIC METABOLIC PANEL
CO2: 30 mEq/L (ref 19–32)
Chloride: 105 mEq/L (ref 96–112)
Sodium: 143 mEq/L (ref 135–145)

## 2012-07-17 MED ORDER — KETOROLAC TROMETHAMINE 60 MG/2ML IJ SOLN
60.0000 mg | Freq: Once | INTRAMUSCULAR | Status: AC
  Administered 2012-07-17: 60 mg via INTRAMUSCULAR

## 2012-07-17 MED ORDER — METHYLPREDNISOLONE ACETATE 80 MG/ML IJ SUSP
80.0000 mg | Freq: Once | INTRAMUSCULAR | Status: AC
  Administered 2012-07-17: 80 mg via INTRAMUSCULAR

## 2012-07-17 NOTE — Progress Notes (Signed)
  Subjective:    Patient ID: Lisa Klein, female    DOB: 12-31-1929, 78 y.o.   MRN: [id number]  HPI The PT is here for follow up and re-evaluation of chronic medical conditions, medication management and review of any available recent lab and radiology data.  Preventive health is updated, specifically  Cancer screening and Immunization.   The PT denies any adverse reactions to current medications since the last visit.  3 week h/o increased low back pain, and notes increasing difficulty with getting out of chairs and getting around, requests any help available C/o urinary frequency with malodor for past week, denies fever, chills or flank pain    Review of Systems See HPI Denies recent fever or chills. Denies sinus pressure, nasal congestion, ear pain or sore throat. Denies chest congestion, productive cough or wheezing. Denies chest pains, palpitations and leg swelling Denies abdominal pain, nausea, vomiting,diarrhea or constipation.    Denies headaches, seizures, numbness, or tingling. Denies depression, anxiety or insomnia. Denies skin break down or rash.        Objective:   Physical Exam Patient alert and oriented and in no cardiopulmonary distress.  HEENT: No facial asymmetry, EOMI, no sinus tenderness,  oropharynx pink and moist.  Neck supple no adenopathy.  Chest: Clear to auscultation bilaterally.  CVS: S1, S2 no murmurs, no S3.  ABD: Soft non tender. Bowel sounds normal.No renal angle or suprapubic tenderness  Ext: No edema  MS: decreased  ROM spine, adequate in shoulders, hips and knees.  Skin: Intact, no ulcerations or rash noted.  Psych: Good eye contact, normal affect. Memory intact not anxious or depressed appearing.  CNS: CN 2-12 intact, power, tone and sensation normal throughout.        Assessment & Plan:

## 2012-07-17 NOTE — Patient Instructions (Addendum)
F/u  4.5 month  Toradol 60mg  and depo medrol 80mg  IM in the office today for back pain  Pleas get the flu vaccine at the pharmacy today  Urine is being checked for infection, we will f/u the result if it appears infected then prescribe antibiotic if needed  You are referred top physical therapy for back pain and left shoulder pain.  Order will be sent for lift for seat to help you get up out of the chair  TSH, chem 7 today

## 2012-07-18 MED ORDER — LOVASTATIN 20 MG PO TABS: 20.0000 mg | ORAL_TABLET | Freq: Every day | ORAL | Status: DC

## 2012-07-18 MED ORDER — POTASSIUM CHLORIDE CRYS ER 10 MEQ PO TBCR: 10.0000 meq | EXTENDED_RELEASE_TABLET | Freq: Two times a day (BID) | ORAL | Status: DC

## 2012-07-18 MED ORDER — LEVOTHYROXINE SODIUM 100 MCG PO TABS: 100.0000 ug | ORAL_TABLET | Freq: Every day | ORAL | Status: DC

## 2012-07-18 MED ORDER — HYDROCHLOROTHIAZIDE 12.5 MG PO CAPS: ORAL_CAPSULE | ORAL | Status: DC

## 2012-07-19 LAB — URINE CULTURE: Colony Count: NO GROWTH

## 2012-07-19 NOTE — Assessment & Plan Note (Signed)
Acute flare with uncontrolled back pain. Anti inflammatories in office and at home

## 2012-07-19 NOTE — Assessment & Plan Note (Signed)
Controlled, no change in medication  

## 2012-07-19 NOTE — Assessment & Plan Note (Signed)
Mildly symptomatic with abn Ua, will f/u c/s before prescribing antibitoic

## 2012-07-19 NOTE — Assessment & Plan Note (Signed)
Hyperlipidemia:Low fat diet discussed and encouraged.  Updated lab next visit 

## 2012-07-19 NOTE — Assessment & Plan Note (Signed)
Controlled, no change in medication DASH diet and commitment to daily physical activity for a minimum of 30 minutes discussed and encouraged, as a part of hypertension management. The importance of attaining a healthy weight is also discussed.  

## 2012-08-04 ENCOUNTER — Ambulatory Visit (HOSPITAL_COMMUNITY)
Admission: RE | Admit: 2012-08-04 | Discharge: 2012-08-04 | Disposition: A | Discharge status: Home / Self Care | Payer: Medicare HMO | Source: Ambulatory Visit | Attending: Family Medicine | Admitting: Family Medicine

## 2012-08-04 DIAGNOSIS — I10 Essential (primary) hypertension: Secondary | ICD-10-CM | POA: Insufficient documentation

## 2012-08-04 DIAGNOSIS — M545 Low back pain, unspecified: Secondary | ICD-10-CM | POA: Insufficient documentation

## 2012-08-04 DIAGNOSIS — IMO0001 Reserved for inherently not codable concepts without codable children: Secondary | ICD-10-CM | POA: Insufficient documentation

## 2012-08-04 DIAGNOSIS — M6281 Muscle weakness (generalized): Secondary | ICD-10-CM | POA: Insufficient documentation

## 2012-08-04 NOTE — Evaluation (Signed)
Physical Therapy Evaluation  Patient Details  Name: MARSHAE AZAM MRN: [id number] Date of Birth: [birth year]  Today's Date: 08/04/2012 Time: 1312-1353 PT Time Calculation (min): 41 min  Visit#: 1  of 8   Re-eval: 09/03/12 Assessment Diagnosis: LBP  Authorization: [employer]  Authorization Visit#: 1  of 8    Past Medical History:  Past Medical History  Diagnosis Date  . DJD (degenerative joint disease)   . Hypertension   . Hypothyroidism   . Obesity   . Degenerative disc disease     with nerve compression   . Hypothyroidism   . Hypertension   . Clotting disorder 05/2011    dvt, right leg  . Cancer 2012    LEFT BREAST   Past Surgical History:  Past Surgical History  Procedure Date  . Vesicovaginal fistula closure w/ tah     [facility]  . Cholecystectomy 80'S    [facility]  . Mastectomy modified radical 02/14/11    left  . Portacath placement 04/16/2011    Procedure: INSERTION PORT-A-CATH;  Surgeon: Dalia Heading;  Location: [facility];  Service: General;  Laterality: Right;  right subclavian  . Cholecystectomy   . Breast surgery     left total mastectomy    Subjective Symptoms/Limitations Symptoms: Ms. Sigmund states that she has had low back pain and R leg pain for yrs.  The pain has been progressive in nature and has reached a point where it is affecting her activites.  She went to the MD who has referred her to PT to improve her functinal ability  Pertinent History: Colon Cancer How long can you sit comfortably?: an hour How long can you stand comfortably?: 10-15 minutes How long can you walk comfortably?: 20 minutes Pain Assessment Currently in Pain?: No/denies (worst pain back 5/10 keg 8/10)  Cognition Overall Cognitive Status: Appears within functional limits for tasks assessed Observation/Other Assessments Observations: oswestry 27  Assessment RLE Strength Right Hip Flexion: 2/5 Right Hip Extension: 3-/5 Right Hip ABduction: 4/5 Right Knee Flexion:  5/5 Right Knee Extension: 3+/5 Right Ankle Dorsiflexion: 3/5 LLE Strength Left Hip Flexion: 4/5 Left Hip Extension: 3-/5 Left Hip ABduction: 3+/5 Left Knee Flexion: 5/5 Left Knee Extension: 5/5 Left Ankle Dorsiflexion: 3+/5 Lumbar AROM Lumbar Flexion: decreased 20% Lumbar Extension: decreased 100% Lumbar - Right Side Bend: decreased 50% Lumbar - Left Side Bend: decreased 30% Lumbar - Right Rotation: decreased 70% Lumbar - Left Rotation: decreased 505 Palpation Palpation: tight mm  Exercise/Treatments     Stretches Active Hamstring Stretch: 3 reps;20 seconds Single Knee to Chest Stretch: 3 reps;30 seconds Lower Trunk Rotation: 3 reps;30 seconds   Supine Ab Set: 5 reps Glut Set: 5 reps    Physical Therapy Assessment and Plan PT Assessment and Plan Clinical Impression Statement: Pt with decreased core strability; poor sitting and standing posture, decreased ROM who will benefit from skilled therapy to maximize functional ability Pt will benefit from skilled therapeutic intervention in order to improve on the following deficits: Decreased activity tolerance;Decreased range of motion;Decreased strength;Difficulty walking;Pain Rehab Potential: Good PT Frequency: Min 2X/week PT Duration: 4 weeks PT Treatment/Interventions: Therapeutic activities;Therapeutic exercise;Modalities;Manual techniques PT Plan: begin bridge; bent knee raise, clam and SL abduction;  second treatment begin heel raise and minisquats    Goals Home Exercise Program Pt will Perform Home Exercise Program: Independently PT Short Term Goals Time to Complete Short Term Goals: 2 weeks PT Short Term Goal 1: Pain decreased back to 4; leg to 6 PT Short Term Goal  2: able to complete bed mobility without difficulty PT Long Term Goals Time to Complete Long Term Goals: 4 weeks PT Long Term Goal 1: sit to stand without difficulty PT Long Term Goal 2: strength improved one grade to allow the above to occur Long  Term Goal 3: Pain no further than her hip Long Term Goal 4: oswerstry score improved 10 points.  PT Long Term Goal 5: back pain no greater than a 3; radicular pain no greater than a 4  Problem List Patient Active Problem List  Diagnosis  . HYPOTHYROIDISM  . HYPERLIPIDEMIA  . OBESITY  . HYPERTENSION  . DEGENERATIVE JOINT DISEASE  . INTERMITTENT VERTIGO  . Infiltrating ductal carcinoma of breast  . DVT (deep venous thrombosis)  . Routine general medical examination at a health care facility  . Cystitis, acute    PT - End of Session Activity Tolerance: Patient tolerated treatment well General Behavior During Session: [facility] for tasks performed Cognition: [facility] for tasks performed PT Plan of Care PT Home Exercise Plan: given  GP Functional Assessment Tool Used: oswestry Functional Limitation: Mobility: Walking and moving around Mobility: Walking and Moving Around Current Status ([id number]): At least 40 percent but less than 60 percent impaired, limited or restricted Mobility: Walking and Moving Around Goal Status [phone]): At least 20 percent but less than 40 percent impaired, limited or restricted  RUSSELL,CINDY 08/04/2012, 4:24 PM  Physician Documentation Your signature is required to indicate approval of the treatment plan as stated above.  Please sign and either send electronically or make a copy of this report for your files and return this physician signed original.   Please mark one 1.__approve of plan  2. ___approve of plan with the following conditions.   ______________________________                                                          _____________________ Physician Signature                                                                                                             Date

## 2012-08-07 ENCOUNTER — Ambulatory Visit (HOSPITAL_COMMUNITY): Payer: Medicare HMO | Admitting: Physical Therapy

## 2012-08-12 ENCOUNTER — Ambulatory Visit (HOSPITAL_COMMUNITY)
Admission: RE | Admit: 2012-08-12 | Discharge: 2012-08-12 | Disposition: A | Discharge status: Home / Self Care | Payer: Medicare HMO | Source: Ambulatory Visit | Attending: Family Medicine | Admitting: Family Medicine

## 2012-08-12 DIAGNOSIS — IMO0001 Reserved for inherently not codable concepts without codable children: Secondary | ICD-10-CM | POA: Insufficient documentation

## 2012-08-12 DIAGNOSIS — M6281 Muscle weakness (generalized): Secondary | ICD-10-CM | POA: Insufficient documentation

## 2012-08-12 DIAGNOSIS — I10 Essential (primary) hypertension: Secondary | ICD-10-CM | POA: Insufficient documentation

## 2012-08-12 DIAGNOSIS — M545 Low back pain, unspecified: Secondary | ICD-10-CM | POA: Insufficient documentation

## 2012-08-12 NOTE — Progress Notes (Signed)
Physical Therapy Treatment Patient Details  Name: DALAYZA ZAMBRANA MRN: [id number] Date of Birth: 1930/01/22  Today's Date: 08/12/2012 Time: 1024-1103 PT Time Calculation (min): 39 min  Visit#: 2  of 8   Re-eval: 09/03/12    Authorization: [employer]  Authorization Time Period: authorized from 12/31/thru 2/14  Charge:  There ex x 38 Subjective: Symptoms/Limitations Symptoms: Ms. Shimmel states that she is very sore. Pain Assessment Currently in Pain?: Yes Pain Score:   3 Pain Location: Back   Exercise/Treatments  Stretches Single Knee to Chest Stretch: 3 reps;30 seconds Lower Trunk Rotation: 5 reps (x2) Standing Extension: 5 reps (x2) Standing Heel Raises: 10 reps Supine Clam: 10 reps Bent Knee Raise: 10 reps Bridge: 10 reps Sidelying Hip Abduction: 10 reps   Physical Therapy Assessment and Plan PT Assessment and Plan Clinical Impression Statement: Pt needs cuing to keep upright during ex.  Explained stab exercises to pt; attempted minisquats but unable do to OA in knees PT Plan: begin t-band exercises for posture      Problem List Patient Active Problem List  Diagnosis  . HYPOTHYROIDISM  . HYPERLIPIDEMIA  . OBESITY  . HYPERTENSION  . DEGENERATIVE JOINT DISEASE  . INTERMITTENT VERTIGO  . Infiltrating ductal carcinoma of breast  . DVT (deep venous thrombosis)  . Routine general medical examination at a health care facility  . Cystitis, acute    PT - End of Session Activity Tolerance: Patient tolerated treatment well General Behavior During Session: [facility] for tasks performed Cognition: [facility] for tasks performed  GP    RUSSELL,CINDY 08/12/2012, 12:07 PM

## 2012-08-14 ENCOUNTER — Ambulatory Visit (HOSPITAL_COMMUNITY)
Admission: RE | Admit: 2012-08-14 | Discharge: 2012-08-14 | Disposition: A | Discharge status: Home / Self Care | Payer: Medicare HMO | Source: Ambulatory Visit | Attending: Family Medicine | Admitting: Family Medicine

## 2012-08-14 NOTE — Progress Notes (Signed)
Physical Therapy Treatment Patient Details  Name: Lisa Klein MRN: [id number] Date of Birth: 23-Apr-1930  Today's Date: 08/14/2012 Time: 1022-1105 PT Time Calculation (min): 43 min  Visit#: 3  of 8   Re-eval: 09/03/12 Authorization: [employer]  Authorization Time Period: authorized from 12/31/thru 2/14  Authorization Visit#: 3  of 8   Charges:  therex 40'  Subjective: Symptoms/Limitations Symptoms: Pt. states she continues to have soreness in B knees and pain into L hip and posterior thigh.  Reports pain is 4/10 today. Pain Assessment Currently in Pain?: Yes Pain Score:   4 Pain Location: Back Pain Orientation: Right   Exercise/Treatments Stretches Single Knee to Chest Stretch: 3 reps;30 seconds Lower Trunk Rotation: 5 reps;10 seconds Standing Heel Raises: 10 reps;Limitations Heel Raises Limitations: toeraises 10 reps Scapular Retraction: 10 reps;Theraband Theraband Level (Scapular Retraction): Level 3 (Green) Row: 10 reps;Theraband Theraband Level (Row): Level 3 (Green) Shoulder Extension: 10 reps;Theraband Theraband Level (Shoulder Extension): Level 3 (Green) Seated Sit to Stand: 5 reps;Limitations Sit to Stand Limitations: no UE's mat height increased to 23 inches Supine Ab Set: 10 reps;5 seconds Glut Set: 10 reps;5 seconds Clam: 10 reps Bent Knee Raise: 10 reps Bridge: 10 reps Straight Leg Raise: 5 reps;Limitations Straight Leg Raises Limitations: bilateral   Manual Therapy Manual Therapy: Other (comment) Other Manual Therapy: nerve glides to R lumbar/LE  Physical Therapy Assessment and Plan PT Assessment and Plan Clinical Impression Statement: Pt. requires postural cues due to forward flexed trunk.  Some activites remain difficult for pt. due to B knee OA/crepitus with R>L; pt. is considering a TKA.  Began tband with VCs for posture/technique, sit to stands from elevated 23" position and nerve glides for lumbar pain.  Pt. with overall improved  mobility after completing therapy. PT Plan: Continue to progress toward goals established by evaluating therapist.     Problem List Patient Active Problem List  Diagnosis  . HYPOTHYROIDISM  . HYPERLIPIDEMIA  . OBESITY  . HYPERTENSION  . DEGENERATIVE JOINT DISEASE  . INTERMITTENT VERTIGO  . Infiltrating ductal carcinoma of breast  . DVT (deep venous thrombosis)  . Routine general medical examination at a health care facility  . Cystitis, acute    PT - End of Session Activity Tolerance: Patient tolerated treatment well General Behavior During Session: [facility] for tasks performed Cognition: [facility] for tasks performed   Lurena Nida, PTA/CLT 08/14/2012, 11:30 AM

## 2012-08-19 ENCOUNTER — Ambulatory Visit (HOSPITAL_COMMUNITY)
Admission: RE | Admit: 2012-08-19 | Discharge: 2012-08-19 | Disposition: A | Discharge status: Home / Self Care | Payer: Medicare HMO | Source: Ambulatory Visit | Attending: Family Medicine | Admitting: Family Medicine

## 2012-08-19 NOTE — Progress Notes (Signed)
Physical Therapy Treatment Patient Details  Name: Lisa Klein MRN: [id number] Date of Birth: [birth year]  Today's Date: 08/19/2012 Time: 4782-9562 PT Time Calculation (min): 39 min  Visit#: 4  of 8   Re-eval: 09/03/12 Charges: Therex x 38'  Authorization: [employer]  Authorization Time Period: authorized from 12/31/thru 2/14  Authorization Visit#: 4  of 8    Subjective: Symptoms/Limitations Symptoms: Pt reports HEP compliance. Pain Assessment Currently in Pain?: Yes Pain Score:   3 Pain Location: Back Pain Orientation: Lower;Right Pain Radiating Towards: R hip   Exercise/Treatments Standing Heel Raises: 10 reps;Limitations Heel Raises Limitations: Toe raises 10 reps Scapular Retraction: 10 reps;Theraband Theraband Level (Scapular Retraction): Level 3 (Green) Row: 10 reps;Theraband Theraband Level (Row): Level 3 (Green) Shoulder Extension: 10 reps;Theraband Theraband Level (Shoulder Extension): Level 3 (Green) Seated Sit to Stand: 5 reps;Limitations Sit to Stand Limitations: no UE's mat height increased to 23 inches Supine Ab Set: 10 reps;5 seconds Glut Set: 10 reps;5 seconds Clam: 10 reps Bent Knee Raise: 10 reps Bridge: 10 reps Straight Leg Raise: 10 reps Straight Leg Raises Limitations: bilateral Sidelying Hip Abduction: 10 reps  Physical Therapy Assessment and Plan PT Assessment and Plan Clinical Impression Statement: Pt continues to have difficulty with STS requiring multimodal cueing for proper technique and min to mod assist secondary to weakness. PT requires frequent motivational cueing and appears to lack confidence in herself. Pt reports pain decrease to 2/10 at end of session. PT Plan: Continue to progress toward goals established by evaluating therapist.     Problem List Patient Active Problem List  Diagnosis  . HYPOTHYROIDISM  . HYPERLIPIDEMIA  . OBESITY  . HYPERTENSION  . DEGENERATIVE JOINT DISEASE  . INTERMITTENT VERTIGO  .  Infiltrating ductal carcinoma of breast  . DVT (deep venous thrombosis)  . Routine general medical examination at a health care facility  . Cystitis, acute    PT - End of Session Activity Tolerance: Patient tolerated treatment well General Behavior During Session: [facility] for tasks performed Cognition: [facility] for tasks performed  Seth Bake, PTA 08/19/2012, 12:18 PM

## 2012-08-21 ENCOUNTER — Encounter (HOSPITAL_COMMUNITY): Payer: Medicare HMO | Attending: Oncology

## 2012-08-21 ENCOUNTER — Ambulatory Visit (HOSPITAL_COMMUNITY)
Admission: RE | Admit: 2012-08-21 | Discharge: 2012-08-21 | Disposition: A | Discharge status: Home / Self Care | Payer: Medicare HMO | Source: Ambulatory Visit | Attending: Family Medicine | Admitting: Family Medicine

## 2012-08-21 DIAGNOSIS — C50919 Malignant neoplasm of unspecified site of unspecified female breast: Secondary | ICD-10-CM

## 2012-08-21 DIAGNOSIS — C50419 Malignant neoplasm of upper-outer quadrant of unspecified female breast: Secondary | ICD-10-CM

## 2012-08-21 DIAGNOSIS — Z9889 Other specified postprocedural states: Secondary | ICD-10-CM | POA: Insufficient documentation

## 2012-08-21 DIAGNOSIS — Z452 Encounter for adjustment and management of vascular access device: Secondary | ICD-10-CM

## 2012-08-21 DIAGNOSIS — Z95828 Presence of other vascular implants and grafts: Secondary | ICD-10-CM

## 2012-08-21 MED ORDER — HEPARIN SOD (PORK) LOCK FLUSH 100 UNIT/ML IV SOLN
INTRAVENOUS | Status: AC
  Filled 2012-08-21: qty 5

## 2012-08-21 MED ORDER — HEPARIN SOD (PORK) LOCK FLUSH 100 UNIT/ML IV SOLN
500.0000 [IU] | Freq: Once | INTRAVENOUS | Status: AC
  Administered 2012-08-21: 500 [IU] via INTRAVENOUS
  Filled 2012-08-21: qty 5

## 2012-08-21 MED ORDER — SODIUM CHLORIDE 0.9 % IJ SOLN
10.0000 mL | INTRAMUSCULAR | Status: DC | PRN
  Administered 2012-08-21: 10 mL via INTRAVENOUS
  Filled 2012-08-21: qty 10

## 2012-08-21 NOTE — Progress Notes (Signed)
Lisa Klein presented for Portacath access and flush. Proper placement of portacath confirmed by CXR. Portacath located right chest wall accessed with  H 20 needle. Good blood return present. Portacath flushed with 20ml NS and 500U/10ml Heparin and needle removed intact. Procedure without incident. Patient tolerated procedure well.

## 2012-08-21 NOTE — Progress Notes (Signed)
Physical Therapy Treatment Patient Details  Name: JHOANA UPHAM MRN: [id number] Date of Birth: 23-Jun-1930  Today's Date: 08/21/2012 Time: 1308-6578 PT Time Calculation (min): 45 min  Visit#: 5  of 6   Re-eval: 09/03/12  Charge:  There ex x 42  Authorization: [employer]  Authorization Time Period: Authorized thru 2/14  Authorization Visit#: 5  of 8    Subjective: Symptoms/Limitations Symptoms: Pt states she is feeling better over all  Exercise/Treatments Stretches Passive Hamstring Stretch: 2 reps;30 seconds Single Knee to Chest Stretch: 3 reps;30 seconds Lower Trunk Rotation: 5 reps Standing Side Bend: 5 reps   Standing Scapular Retraction: 10 reps;Theraband Theraband Level (Scapular Retraction): Level 3 (Green) Row: 10 reps;Theraband Theraband Level (Row): Level 3 (Green) Shoulder Extension: 10 reps;Theraband Theraband Level (Shoulder Extension): Level 3 (Green) Other Standing Lumbar Exercises: posture B UE flex at wall x 10 Seated Sit to Stand: 5 reps;Limitations Sit to Stand Limitations: no UE's mat height increased to 23 inches Supine Dead Bug: 10 reps Bridge: 10 reps Straight Leg Raise: 10 reps Straight Leg Raises Limitations: bilateral Other Supine Lumbar Exercises: decompressive 1-5 Sidelying Clam: 5 reps Hip Abduction: 10 reps SL clams x 5 Physical Therapy Assessment and Plan PT Assessment and Plan Clinical Impression Statement: Pt continues with bent forward position. Today's treatment focused on posture with pt being given T-band for standing postural exercises at home as well as adding decompressive exercises 1-5 from [facility].  Pt needs verbal cue to keep core tight durning stab exercises PT Plan: Begin prone exercises next treatment POE; heel squeeze and SLR.    Goals Home Exercise Program PT Goal: Perform Home Exercise Program - Progress: Met PT Short Term Goals PT Short Term Goal 1 - Progress: Met PT Short Term Goal 2 - Progress: Progressing toward  goal PT Long Term Goals PT Long Term Goal 1 - Progress: Progressing toward goal PT Long Term Goal 2 - Progress: Progressing toward goal Long Term Goal 3 Progress: Not met Long Term Goal 5 Progress: Progressing toward goal  Problem List Patient Active Problem List  Diagnosis  . HYPOTHYROIDISM  . HYPERLIPIDEMIA  . OBESITY  . HYPERTENSION  . DEGENERATIVE JOINT DISEASE  . INTERMITTENT VERTIGO  . Infiltrating ductal carcinoma of breast  . DVT (deep venous thrombosis)  . Routine general medical examination at a health care facility  . Cystitis, acute    PT - End of Session Activity Tolerance: Patient tolerated treatment well General Behavior During Session: [facility] for tasks performed Cognition: [facility] for tasks performed  GP    Bitha Fauteux,CINDY 08/21/2012, 2:52 PM

## 2012-08-26 ENCOUNTER — Ambulatory Visit (HOSPITAL_COMMUNITY)
Admission: RE | Admit: 2012-08-26 | Discharge: 2012-08-26 | Disposition: A | Discharge status: Home / Self Care | Payer: Medicare HMO | Source: Ambulatory Visit | Attending: Family Medicine | Admitting: Family Medicine

## 2012-08-26 NOTE — Progress Notes (Signed)
Physical Therapy Treatment Patient Details  Name: Lisa Klein MRN: [id number] Date of Birth: 1929/10/20  Today's Date: 08/26/2012 Time: 1102-1153 PT Time Calculation (min): 51 min Visit#: 6  of 8   Re-eval: 09/03/12 Authorization: Francine Graven  Authorization Time Period: Authorized thru 2/14  Authorization Visit#: 6  of 8   Charges:  therex 46'  Subjective: Symptoms/Limitations Symptoms: Pt. states her back is feeling much better; she mainly hurts in her B knees and hips due to arthritis. Pain Assessment Currently in Pain?: No/denies   Exercise/Treatments Stretches Single Knee to Chest Stretch: 3 reps;30 seconds Lower Trunk Rotation: 5 reps;10 seconds Standing Scapular Retraction: Limitations Scapular Retraction Limitations: HEP Row: Limitations Row Limitations: HEP Shoulder Extension: Limitations Shoulder Extension Limitations: HEP Seated Sit to Stand: Limitations Sit to Stand Limitations: 3 reps/ knee pain and couldn't do anymore;no UE's mat height increased to 23 inches Supine Dead Bug: 10 reps Bridge: 15 reps Straight Leg Raise: 10 reps Straight Leg Raises Limitations: bilateral Other Supine Lumbar Exercises: decompressive 1-5 for HEP Sidelying Clam: 5 reps;Limitations Clam Limitations: 10" holds, bilateral Hip Abduction: 10 reps;Limitations Hip Abduction Limitations: bilateral Prone  Straight Leg Raise: 5 reps Other Prone Lumbar Exercises: POE 1 minute Other Prone Lumbar Exercises: Heelsqueezes 10X5"    Physical Therapy Assessment and Plan PT Assessment and Plan Clinical Impression Statement: Pt. with audible/palpable grinding in knees R>L with motion/flexion.  Pt. instructed with seated LAQ and hip flexion to do at home to help strengthen LE's.  Lumbar overall improving without pain today.  Pt. requires less cues with  all exercises.  Added prone stab exercises with verbal cues; tendency to get cramps in R hamstring LE exercises; unable to bend knees fully  with heelsqueezes due to arthritis. PT Plan: Continue to progress strength and stability progressing towards meeting goals.     Problem List Patient Active Problem List  Diagnosis  . HYPOTHYROIDISM  . HYPERLIPIDEMIA  . OBESITY  . HYPERTENSION  . DEGENERATIVE JOINT DISEASE  . INTERMITTENT VERTIGO  . Infiltrating ductal carcinoma of breast  . DVT (deep venous thrombosis)  . Routine general medical examination at a health care facility  . Cystitis, acute    PT - End of Session Activity Tolerance: Patient tolerated treatment well General Behavior During Session: [facility] for tasks performed Cognition: [facility] for tasks performed   Lurena Nida, PTA/CLT 08/26/2012, 12:14 PM

## 2012-08-28 ENCOUNTER — Ambulatory Visit (HOSPITAL_COMMUNITY)
Admission: RE | Admit: 2012-08-28 | Discharge: 2012-08-28 | Disposition: A | Discharge status: Home / Self Care | Payer: Medicare HMO | Source: Ambulatory Visit | Attending: Family Medicine | Admitting: Family Medicine

## 2012-08-28 NOTE — Progress Notes (Signed)
Physical Therapy Treatment Patient Details  Name: LOUANNE CALVILLO MRN: [id number] Date of Birth: 1929-11-05  Today's Date: 08/28/2012 Time: 1015-1107 PT Time Calculation (min): 52 min  Visit#: 7  of 8     Charge:  There ex x 40; manual x 8 Authorization: [employer]  Authorization Time Period: Authorization thru 2/14  Authorization Visit#: 7  of 8    Subjective: Symptoms/Limitations Symptoms: Pt states she is not having any problem doing her home exercise program. Pain Assessment Pain Score: 0-No pain  Exercise/Treatments Standing Heel Raises: 10 reps Functional Squats: 10 reps Other Standing Lumbar Exercises: posture B UE flex at walll Other Standing Lumbar Exercises: in corner B shld ex into wall x 10 Seated Other Seated Lumbar Exercises: sit to stand w/ mat elevated  Supine Bridge: 10 reps Other Supine Lumbar Exercises: Hip adduction/abduction x10 for pubic clearing   Prone  Straight Leg Raise: 10 reps Other Prone Lumbar Exercises:  (POE x 1 min; axial ext x 10) Other Prone Lumbar Exercises: heelsqueeze x 10; shld ext x 10    Manual Therapy Manual Therapy: Other (comment) Other Manual Therapy: manual for SI dysfunction.  Physical Therapy Assessment and Plan PT Assessment and Plan Clinical Impression Statement: Pt with significant OA B knees therefore worked on sit to stand at increased height to take stress off of knees.  Pt unable to complete Prone SLR with correct form but when modified to bent knee form improved.  Pt more aware of the need to proper posture but has difficutly maintaining due to OA and weakness.   Pt R SI mobilized posteriorly with good results. PT Plan: continue postural and strengthening exercises.  Check SI alignment.  Goals Home Exercise Program PT Goal: Perform Home Exercise Program - Progress: Met PT Short Term Goals PT Short Term Goal 1 - Progress: Met PT Short Term Goal 2 - Progress: Progressing toward goal PT Long Term Goals PT Long  Term Goal 1 - Progress: Progressing toward goal PT Long Term Goal 2 - Progress: Progressing toward goal Long Term Goal 3 Progress: Met Long Term Goal 4 Progress: Progressing toward goal Long Term Goal 5 Progress: Met  Problem List Patient Active Problem List  Diagnosis  . HYPOTHYROIDISM  . HYPERLIPIDEMIA  . OBESITY  . HYPERTENSION  . DEGENERATIVE JOINT DISEASE  . INTERMITTENT VERTIGO  . Infiltrating ductal carcinoma of breast  . DVT (deep venous thrombosis)  . Routine general medical examination at a health care facility  . Cystitis, acute    General Behavior During Session: [facility] for tasks performed Cognition: [facility] for tasks performed  GP    RUSSELL,CINDY 08/28/2012, 12:14 PM

## 2012-09-02 ENCOUNTER — Ambulatory Visit (HOSPITAL_COMMUNITY)
Admission: RE | Admit: 2012-09-02 | Discharge: 2012-09-02 | Disposition: A | Discharge status: Home / Self Care | Payer: Medicare HMO | Source: Ambulatory Visit | Attending: Family Medicine | Admitting: Family Medicine

## 2012-09-02 NOTE — Progress Notes (Signed)
Physical Therapy Treatment Patient Details  Name: Lisa Klein MRN: [id number] Date of Birth: [birth year]  Today's Date: 09/02/2012 Time: 4098-1191 PT Time Calculation (min): 44 min  Visit#: 8  of 8   Re-eval: 09/03/12   Charge manual x 12; there ex x 32 Authorization: [employer]  Authorization Time Period: Thru 2/14 - pt will need continued therapy  Authorization Visit#: 8  of 8    Subjective:  Pt states she is doing her exercises at home.      Exercise/Treatments  Stretches Passive Hamstring Stretch: 1 rep;60 seconds Standing Wall Slides: 5 reps;Limitations Wall Slides Limitations: 20 degree bend only secondary to knee pain Other Standing Lumbar Exercises: posture B UE flex at walll Other Standing Lumbar Exercises: in corner B shld ex into wall x 10  Supine Bridge: 15 reps Other Supine Lumbar Exercises: Hip adduction/abduction x10 for pubic clearing Sidelying Clam: 10 reps Hip Abduction: 10 reps Prone  Single Arm Raise: 10 reps Straight Leg Raise: 10 reps Opposite Arm/Leg Raise: 10 reps Other Prone Lumbar Exercises:  (POE x 1 min; axial ext x 10) Other Prone Lumbar Exercises: heelsqueeze x 10; shld ext x 10  Manual Therapy Other Manual Therapy: mm energy techniques for SI allignment with successful results   Physical Therapy Assessment and Plan PT Assessment and Plan Clinical Impression Statement: Pt able to complete prone exercises without significant cuing.  Pt responds well to SI adjustment stating no pain at the end of the session. PT Plan: Pt needs re-evaluation next treatment.     Problem List Patient Active Problem List  Diagnosis  . HYPOTHYROIDISM  . HYPERLIPIDEMIA  . OBESITY  . HYPERTENSION  . DEGENERATIVE JOINT DISEASE  . INTERMITTENT VERTIGO  . Infiltrating ductal carcinoma of breast  . DVT (deep venous thrombosis)  . Routine general medical examination at a health care facility  . Cystitis, acute    General Behavior During Session: [facility] for  tasks performed Cognition: [facility] for tasks performed  GP    RUSSELL,CINDY 09/02/2012, 2:20 PM

## 2012-10-01 ENCOUNTER — Encounter (HOSPITAL_COMMUNITY): Payer: Medicare HMO | Attending: Oncology

## 2012-10-01 ENCOUNTER — Other Ambulatory Visit: Payer: Self-pay

## 2012-10-01 DIAGNOSIS — C50919 Malignant neoplasm of unspecified site of unspecified female breast: Secondary | ICD-10-CM | POA: Insufficient documentation

## 2012-10-01 DIAGNOSIS — C50419 Malignant neoplasm of upper-outer quadrant of unspecified female breast: Secondary | ICD-10-CM

## 2012-10-01 DIAGNOSIS — Z452 Encounter for adjustment and management of vascular access device: Secondary | ICD-10-CM

## 2012-10-01 DIAGNOSIS — R079 Chest pain, unspecified: Secondary | ICD-10-CM | POA: Insufficient documentation

## 2012-10-01 LAB — COMPREHENSIVE METABOLIC PANEL
Albumin: 3.4 g/dL — ABNORMAL LOW (ref 3.5–5.2)
Alkaline Phosphatase: 82 U/L (ref 39–117)
BUN: 12 mg/dL (ref 6–23)
CO2: 26 mEq/L (ref 19–32)
Chloride: 102 mEq/L (ref 96–112)
GFR calc Af Amer: 72 mL/min — ABNORMAL LOW (ref >90)
Glucose, Bld: 97 mg/dL (ref 70–99)
Potassium: 3.6 mEq/L (ref 3.5–5.1)
Total Bilirubin: 0.7 mg/dL (ref 0.3–1.2)

## 2012-10-01 LAB — CBC WITH DIFFERENTIAL/PLATELET
Hemoglobin: 11.6 g/dL — ABNORMAL LOW (ref 12.0–15.0)
Lymphocytes Relative: 23 % (ref 12–46)
Lymphs Abs: 1.1 10*3/uL (ref 0.7–4.0)
Monocytes Relative: 13 % — ABNORMAL HIGH (ref 3–12)
Neutro Abs: 3 10*3/uL (ref 1.7–7.7)
Neutrophils Relative %: 61 % (ref 43–77)
RBC: 3.99 MIL/uL (ref 3.87–5.11)

## 2012-10-01 MED ORDER — SODIUM CHLORIDE 0.9 % IJ SOLN
10.0000 mL | INTRAMUSCULAR | Status: DC | PRN
  Administered 2012-10-01: 10 mL via INTRAVENOUS
  Filled 2012-10-01: qty 10

## 2012-10-01 MED ORDER — HEPARIN SOD (PORK) LOCK FLUSH 100 UNIT/ML IV SOLN
INTRAVENOUS | Status: AC
  Filled 2012-10-01: qty 5

## 2012-10-01 MED ORDER — HEPARIN SOD (PORK) LOCK FLUSH 100 UNIT/ML IV SOLN
500.0000 [IU] | Freq: Once | INTRAVENOUS | Status: AC
  Administered 2012-10-01: 500 [IU] via INTRAVENOUS
  Filled 2012-10-01: qty 5

## 2012-10-01 NOTE — Progress Notes (Signed)
Lisa Klein presented for Portacath access and flush. Proper placement of portacath confirmed by CXR. Portacath located rt chest wall accessed with  H 20 needle. Good blood return present. Portacath flushed with 20ml NS and 500U/57ml Heparin and needle removed intact. Procedure without incident. Patient tolerated procedure well. Patient c/o chest pain starting Monday am which is intermittent feels like pressure. Some nausea at times. Dr. Mariel Sleet in and ekg ordered and reviewed.

## 2012-10-02 ENCOUNTER — Other Ambulatory Visit (HOSPITAL_COMMUNITY): Payer: Medicare HMO

## 2012-10-03 ENCOUNTER — Encounter (HOSPITAL_COMMUNITY): Payer: Self-pay | Admitting: Oncology

## 2012-10-03 ENCOUNTER — Encounter (HOSPITAL_BASED_OUTPATIENT_CLINIC_OR_DEPARTMENT_OTHER): Payer: Medicare HMO | Admitting: Oncology

## 2012-10-03 VITALS — BP 164/83 | HR 102 | Temp 97.5°F | Resp 20 | Wt 187.0 lb

## 2012-10-03 NOTE — Progress Notes (Signed)
#  1 left-sided infiltrating ductal carcinoma the breast status post modified radical mastectomy on 02/14/2011 followed by 6 cycles of epirubicin and Cytoxan. She did have dose reductions. She then went on have radiation therapy followed by the initiation of Arimidex on 11/19/2011. She will take this for 5 years minimum. She is doing very well with the drug without side effects. She thought she can tell the difference in how she felt however when they went from trade names to generic. She could not be more specific other than she fell slightly "different". #2 indigestion #3 excessive weight #4 hypertension #5 hypothyroidism #6 DVT of the right leg treated with Coumadin in the past  She is doing very well her review of systems however she was here the other day having some nonspecific chest discomfort without sweating, without nausea, in without vomiting, without radiation. EKG was very unremarkable. She was in no acute distress when I saw her in the examining room for her port flush.  She states the discomfort is gone away completely and she attributes it's occurrence to eating onions and garlic the night before. Her oncology review of systems remains negative. She is rarely constipated moving her bowels every other day.   BP 164/83  Pulse 102  Temp(Src) 97.5 F (36.4 C) (Oral)  Resp 20  Wt 187 lb (84.823 kg)  BMI 30.2 kg/m2  Her vitals are stable. Lymph nodes remain negative throughout including cervical, supraclavicular, infraclavicular, axillary and inguinal areas. Lungs are clear to auscultation and percussion. Skin exam is unremarkable. She has no obvious thyromegaly. Left chest wall remains clear. The right breast is negative for masses. Heart shows a regular rhythm and rate without murmur rub or gallop. She remains in no acute distress. Abdomen is soft and nontender with decreased bowel sounds. She has no hepatosplenomegaly. She has no leg edema or arm edema.  She will continue the  Arimidex and see [place] back every 6 months. Her Port-A-Cath in the right upper chest wall is also intact but she would like it removed which is an adequate option at this juncture. She will contact Dr. Lovell Sheehan for removal.

## 2012-10-03 NOTE — Patient Instructions (Addendum)
[facility] Discharge Instructions  RECOMMENDATIONS MADE BY THE CONSULTANT AND ANY TEST RESULTS WILL BE SENT TO YOUR REFERRING PHYSICIAN.  EXAM FINDINGS BY THE PHYSICIAN TODAY AND SIGNS OR SYMPTOMS TO REPORT TO CLINIC OR PRIMARY PHYSICIAN: Exam and discussion by MD.  Bonita Quin are doing well.  No evidence of recurrence by exam.  It's ok for you to get your port removed.  MEDICATIONS PRESCRIBED:  Continue your arimidex  INSTRUCTIONS GIVEN AND DISCUSSED: Report any new lumps, bone pain or shortness of breath.  SPECIAL INSTRUCTIONS/FOLLOW-UP: 6 months for blood work then to see PA.   Thank you for choosing [facility] to provide your oncology and hematology care.  To afford each patient quality time with our providers, please arrive at least 15 minutes before your scheduled appointment time.  With your help, our goal is to use those 15 minutes to complete the necessary work-up to ensure our physicians have the information they need to help with your evaluation and healthcare recommendations.    Effective January 1st, 2014, we ask that you re-schedule your appointment with our physicians should you arrive 10 or more minutes late for your appointment.  We strive to give you quality time with our providers, and arriving late affects you and other patients whose appointments are after yours.    Again, thank you for choosing [facility].  Our hope is that these requests will decrease the amount of time that you wait before being seen by our physicians.       _____________________________________________________________  Should you have questions after your visit to [facility], please contact our office at [phone] between the hours of 8:30 a.m. and 5:00 p.m.  Voicemails left after 4:30 p.m. will not be returned until the following business day.  For prescription refill requests, have your pharmacy contact our office with your prescription  refill request.

## 2012-10-17 ENCOUNTER — Emergency Department (HOSPITAL_COMMUNITY): Payer: Medicare HMO

## 2012-10-17 ENCOUNTER — Encounter (HOSPITAL_COMMUNITY): Payer: Self-pay | Admitting: Emergency Medicine

## 2012-10-17 ENCOUNTER — Inpatient Hospital Stay (HOSPITAL_COMMUNITY)
Admission: EM | Admit: 2012-10-17 | Discharge: 2012-10-22 | DRG: 176 | Disposition: A | Discharge status: Home / Self Care | Payer: Medicare HMO | Attending: Internal Medicine | Admitting: Internal Medicine

## 2012-10-17 DIAGNOSIS — Z9089 Acquired absence of other organs: Secondary | ICD-10-CM

## 2012-10-17 DIAGNOSIS — C50919 Malignant neoplasm of unspecified site of unspecified female breast: Secondary | ICD-10-CM | POA: Diagnosis present

## 2012-10-17 DIAGNOSIS — Z9010 Acquired absence of unspecified breast and nipple: Secondary | ICD-10-CM

## 2012-10-17 DIAGNOSIS — I824Z9 Acute embolism and thrombosis of unspecified deep veins of unspecified distal lower extremity: Secondary | ICD-10-CM | POA: Diagnosis present

## 2012-10-17 DIAGNOSIS — I079 Rheumatic tricuspid valve disease, unspecified: Secondary | ICD-10-CM | POA: Diagnosis present

## 2012-10-17 DIAGNOSIS — Z923 Personal history of irradiation: Secondary | ICD-10-CM

## 2012-10-17 DIAGNOSIS — Z86718 Personal history of other venous thrombosis and embolism: Secondary | ICD-10-CM

## 2012-10-17 DIAGNOSIS — I2699 Other pulmonary embolism without acute cor pulmonale: Principal | ICD-10-CM

## 2012-10-17 DIAGNOSIS — E669 Obesity, unspecified: Secondary | ICD-10-CM | POA: Diagnosis present

## 2012-10-17 DIAGNOSIS — J984 Other disorders of lung: Secondary | ICD-10-CM | POA: Diagnosis present

## 2012-10-17 DIAGNOSIS — I2489 Other forms of acute ischemic heart disease: Secondary | ICD-10-CM | POA: Diagnosis present

## 2012-10-17 DIAGNOSIS — E785 Hyperlipidemia, unspecified: Secondary | ICD-10-CM | POA: Diagnosis present

## 2012-10-17 DIAGNOSIS — E039 Hypothyroidism, unspecified: Secondary | ICD-10-CM | POA: Diagnosis present

## 2012-10-17 DIAGNOSIS — E782 Mixed hyperlipidemia: Secondary | ICD-10-CM | POA: Diagnosis present

## 2012-10-17 DIAGNOSIS — M1990 Unspecified osteoarthritis, unspecified site: Secondary | ICD-10-CM | POA: Diagnosis present

## 2012-10-17 DIAGNOSIS — I82409 Acute embolism and thrombosis of unspecified deep veins of unspecified lower extremity: Secondary | ICD-10-CM | POA: Diagnosis present

## 2012-10-17 DIAGNOSIS — I10 Essential (primary) hypertension: Secondary | ICD-10-CM | POA: Diagnosis present

## 2012-10-17 DIAGNOSIS — Z79899 Other long term (current) drug therapy: Secondary | ICD-10-CM

## 2012-10-17 DIAGNOSIS — Z9221 Personal history of antineoplastic chemotherapy: Secondary | ICD-10-CM

## 2012-10-17 LAB — URINALYSIS, ROUTINE W REFLEX MICROSCOPIC
Bilirubin Urine: NEGATIVE
Nitrite: NEGATIVE
Specific Gravity, Urine: 1.02 (ref 1.005–1.030)
pH: 5.5 (ref 5.0–8.0)

## 2012-10-17 LAB — CBC WITH DIFFERENTIAL/PLATELET
Basophils Absolute: 0 10*3/uL (ref 0.0–0.1)
Basophils Relative: 1 % (ref 0–1)
Hemoglobin: 12.7 g/dL (ref 12.0–15.0)
MCHC: 33.1 g/dL (ref 30.0–36.0)
Neutro Abs: 3.8 10*3/uL (ref 1.7–7.7)
Neutrophils Relative %: 68 % (ref 43–77)
Platelets: 131 10*3/uL — ABNORMAL LOW (ref 150–400)
RDW: 14.2 % (ref 11.5–15.5)

## 2012-10-17 LAB — COMPREHENSIVE METABOLIC PANEL
AST: 22 U/L (ref 0–37)
Albumin: 3.5 g/dL (ref 3.5–5.2)
Alkaline Phosphatase: 84 U/L (ref 39–117)
Chloride: 98 mEq/L (ref 96–112)
Potassium: 3.5 mEq/L (ref 3.5–5.1)
Total Bilirubin: 0.4 mg/dL (ref 0.3–1.2)

## 2012-10-17 LAB — URINE MICROSCOPIC-ADD ON

## 2012-10-17 LAB — POCT I-STAT TROPONIN I: Troponin i, poc: 0.86 ng/mL (ref 0.00–0.08)

## 2012-10-17 LAB — PROCALCITONIN: Procalcitonin: <0.1 ng/mL

## 2012-10-17 MED ORDER — SODIUM CHLORIDE 0.9 % IV SOLN
1000.0000 mL | Freq: Once | INTRAVENOUS | Status: AC
  Administered 2012-10-17: 1000 mL via INTRAVENOUS

## 2012-10-17 MED ORDER — DEXTROSE 5 % IV SOLN
1.0000 g | Freq: Once | INTRAVENOUS | Status: AC
  Administered 2012-10-17: 1 g via INTRAVENOUS
  Filled 2012-10-17: qty 10

## 2012-10-17 MED ORDER — SODIUM CHLORIDE 0.9 % IV SOLN
1000.0000 mL | INTRAVENOUS | Status: DC
  Administered 2012-10-17 – 2012-10-19 (×3): 1000 mL via INTRAVENOUS

## 2012-10-17 MED ORDER — ENOXAPARIN SODIUM 100 MG/ML ~~LOC~~ SOLN
1.0000 mg/kg | Freq: Once | SUBCUTANEOUS | Status: AC
  Administered 2012-10-17: 90 mg via SUBCUTANEOUS
  Filled 2012-10-17: qty 1

## 2012-10-17 MED ORDER — DEXTROSE 5 % IV SOLN
500.0000 mg | Freq: Once | INTRAVENOUS | Status: AC
  Administered 2012-10-17: 500 mg via INTRAVENOUS
  Filled 2012-10-17: qty 500

## 2012-10-17 MED ORDER — IOHEXOL 350 MG/ML SOLN
100.0000 mL | Freq: Once | INTRAVENOUS | Status: AC | PRN
  Administered 2012-10-17: 100 mL via INTRAVENOUS

## 2012-10-17 NOTE — ED Notes (Signed)
Pt became sob worse with movement onto ct table, sats in the 70's. Placed pt on nrb with 100% O2 with improvement and ct chest was completed.  Pt remains on nrb on return to e.d. tx room d/t same.  Pt states she gets very sob with any movement.

## 2012-10-17 NOTE — ED Notes (Signed)
Patient became increasingly short of breath when assisted into hospital gown. Patient oxygen saturations dropped to low 80s upon exertion.

## 2012-10-17 NOTE — ED Notes (Signed)
Patient's O2 sats decreased to 85%.  O2 increased to 4L/min and O2 sats have increased to 93%.

## 2012-10-17 NOTE — ED Notes (Signed)
Patient resting comfortably in bed with eyes closed.  Equal rise and fall of chest noted.   

## 2012-10-17 NOTE — ED Notes (Signed)
Patient placed on bedpan and urine collected.  Patient became extremely short of breath and O2 sats decreased to 78% and HR increased to 133.

## 2012-10-17 NOTE — ED Notes (Signed)
Patient with c/o shortness of breath. H/o breast cancer, DVTs. Sudden onset, pain in back with inspirations. Reports weak cough. Patient alert/oriented x 4.

## 2012-10-17 NOTE — ED Notes (Signed)
NRB mask removed and patient placed back on [place] at 2L/min.

## 2012-10-17 NOTE — Progress Notes (Signed)
ANTIBIOTIC CONSULT NOTE - INITIAL  Pharmacy Consult for Rocephin/Zithromax Indication: rule out sepsis  No Known Allergies  Patient Measurements: Height: 5\' 6"  (167.6 cm) Weight: 198 lb (89.812 kg) IBW/kg (Calculated) : 59.3  Vital Signs: Temp: 98 F (36.7 C) (03/14 1801) Temp src: Oral (03/14 1801) BP: 130/78 mmHg (03/14 1801) Pulse Rate: 125 (03/14 1801) Intake/Output from previous day:   Intake/Output from this shift:    Labs: No results found for this basename: WBC, HGB, PLT, LABCREA, CREATININE,  in the last 72 hours Estimated Creatinine Clearance: 58.9 ml/min (by C-G formula based on Cr of 0.86). No results found for this basename: VANCOTROUGH, VANCOPEAK, VANCORANDOM, GENTTROUGH, GENTPEAK, GENTRANDOM, TOBRATROUGH, TOBRAPEAK, TOBRARND, AMIKACINPEAK, AMIKACINTROU, AMIKACIN,  in the last 72 hours   Microbiology: No results found for this or any previous visit (from the past 720 hour(s)).  Medical History: Past Medical History  Diagnosis Date  . DJD (degenerative joint disease)   . Hypertension   . Hypothyroidism   . Obesity   . Degenerative disc disease     with nerve compression   . Hypothyroidism   . Hypertension   . Clotting disorder 05/2011    dvt, right leg  . Cancer 2012    LEFT BREAST    Medications:  Scheduled:   Assessment: Okay for Protocol Patient has orders for initial doses already entered by MD.  Goal of Therapy:  Eradicate infection.  Plan:  Zithromax 500mg  IV every 24 hours. Rocephin 1gm IV every 24 hours. Follow up culture results  Mady Gemma 10/17/2012,6:40 PM

## 2012-10-17 NOTE — H&P (Addendum)
History and Physical  Lisa Klein ZOX:[id number] DOB: [birth year] DOA: 10/17/2012  Referring physician: ER PCP: [name], MD   Chief Complaint: Progressive shortness of breath  HPI: Lisa Klein is a 79 y.o. female who presented to the Emergency Department complaining of gradually worsening intermittent SOB. Patient also complained of squeezing/pressure-like chest pain in episodes of a couple hours for the past 2 weeks, about every day. Today when patient was walking up her mailbox she got extremely short of breath and sat outside her house in a chair. When her daughter called her she was unable to walk inside the house to pick up the phone. At this time patient was brought into the ER.  Pt reports some associated fatigue, weakness, and rare cough. She denies any confusion, emesis, fevers, numbness, or weakness on one side of her body.   Pt saw her PCP, Dr. Laurie Panda, last week for the same. He did an EKG which was negative. Pt is not on oxygen at home. Pt has a h/o blood clots in her legs several years ago but she has never had a blood clot in her lungs. Pt was taken off coumadin about a year ago. Pt was diagnosed with breast cancer (left breast) in 2012. Her last chemo treatment was about a year ago. Pt lives alone and usually is fine.  Patient's oxygen saturations were very low in the ER and dropped further to 80% on 4L O2 upon exertion, when being helped into her hospital gown. Her CT angio of chest was suggestive of bilateral pulmonary embolism right greater than left. ER physician called me for medical admission.   Review of Systems:  15 point review of system was negative except what is noted above.   Past Medical History  Diagnosis Date  . DJD (degenerative joint disease)   . Hypertension   . Hypothyroidism   . Obesity   . Degenerative disc disease     with nerve compression   . Hypothyroidism   . Hypertension   . Clotting disorder 05/2011    dvt, right leg  .  Cancer 2012    LEFT BREAST    Past Surgical History  Procedure Laterality Date  . Vesicovaginal fistula closure w/ tah      [facility]  . Cholecystectomy  80'S    [facility]  . Mastectomy modified radical  02/14/11    left  . Portacath placement  04/16/2011    Procedure: INSERTION PORT-A-CATH;  Surgeon: Dalia Heading;  Location: [facility];  Service: General;  Laterality: Right;  right subclavian  . Cholecystectomy    . Breast surgery      left total mastectomy    Social History:  reports that she has never smoked. She has never used smokeless tobacco. She reports that she does not drink alcohol or use illicit drugs.  No Known Allergies  Family History  Problem Relation Age of Onset  . COPD Father   . Lung disease Father   . Hypertension Sister   . Anesthesia problems Neg Hx   . Malignant hyperthermia Neg Hx   . Hypotension Neg Hx   . Pseudochol deficiency Neg Hx      Prior to Admission medications   Medication Sig Start Date End Date Taking? Authorizing Provider  acetaminophen (TYLENOL) 650 MG CR tablet Take 650 mg by mouth daily as needed for pain.    Yes Historical Provider, MD  anastrozole (ARIMIDEX) 1 MG tablet Take 1 mg by mouth at bedtime. 05/28/12  Yes Ellouise Newer, PA-C  calcium-vitamin D (OSCAL 500/200 D-3) 500-200 MG-UNIT per tablet Take 1 tablet by mouth every morning.    Yes Historical Provider, MD  Cholecalciferol (VITAMIN D3) 1000 UNITS CAPS Take 1,000 Units by mouth every morning.    Yes Historical Provider, MD  docusate sodium (COLACE) 100 MG capsule Take 100 mg by mouth daily as needed for constipation. For constipation   Yes Historical Provider, MD  hydrochlorothiazide (MICROZIDE) 12.5 MG capsule Take 12.5 mg by mouth every morning. 07/18/12  Yes Kerri Perches, MD  levothyroxine (SYNTHROID, LEVOTHROID) 100 MCG tablet Take 100 mcg by mouth every morning. 07/18/12  Yes Kerri Perches, MD  lovastatin (MEVACOR) 20 MG tablet Take 1 tablet (20 mg total) by mouth at  bedtime. 07/18/12  Yes Kerri Perches, MD  potassium chloride (K-DUR,KLOR-CON) 10 MEQ tablet Take 1 tablet (10 mEq total) by mouth 2 (two) times daily. 07/18/12  Yes Kerri Perches, MD   Physical Exam: Filed Vitals:   10/17/12 1801 10/17/12 2000 10/17/12 2100  BP: 130/78 130/76 116/76  Pulse: 125 119 117  Temp: 98 F (36.7 C)    TempSrc: Oral    Resp: 32 32 27  Height: 5\' 6"  (1.676 m)    Weight: 198 lb (89.812 kg)    SpO2: 91% 92% 98%   Physical Exam: General: Vital signs reviewed and noted. Well-developed, well-nourished, in no acute distress; alert, appropriate and cooperative throughout examination.  Head: Normocephalic, atraumatic.  Eyes: PERRL, EOMI, No signs of anemia or jaundince.  Nose: Mucous membranes moist, not inflammed, nonerythematous.  Throat: Oropharynx nonerythematous, no exudate appreciated.   Neck: No deformities, masses, or tenderness noted.Supple, No carotid Bruits, no JVD.  Lungs:  Normal respiratory effort. Clear to auscultation BL without crackles or wheezes.  Heart: RRR. S1 and S2 normal without gallop, murmur, or rubs.  Abdomen:  BS normoactive. Soft, Nondistended, non-tender.  No masses or organomegaly.  Extremities: Trace bilateral pitting edema  Neurologic: A&O X3, CN II - XII are grossly intact. Motor strength is 5/5 in the all 4 extremities, Sensations intact to light touch, Cerebellar signs negative.  Skin: No visible rashes, scars.Port-A-Cath in place in right upper chest     Wt Readings from Last 3 Encounters:  10/17/12 198 lb (89.812 kg)  10/03/12 187 lb (84.823 kg)  07/17/12 194 lb (87.998 kg)    Labs on Admission:  Basic Metabolic Panel:  Recent Labs Lab 10/17/12 1834  NA 136  K 3.5  CL 98  CO2 24  GLUCOSE 124*  BUN 12  CREATININE 0.78  CALCIUM 9.5    Liver Function Tests:  Recent Labs Lab 10/17/12 1834  AST 22  ALT 16  ALKPHOS 84  BILITOT 0.4  PROT 7.2  ALBUMIN 3.5   CBC:  Recent Labs Lab 10/17/12 1834   WBC 5.5  NEUTROABS 3.8  HGB 12.7  HCT 38.4  MCV 88.1  PLT 131*   Troponin (Point of Care Test)  Recent Labs  10/17/12 1954  TROPIPOC 0.86*    Radiological Exams on Admission: Ct Angio Chest Pe W/cm &/or Wo Cm  10/17/2012  *RADIOLOGY REPORT*  Clinical Data: Acute shortness breath.  Deep venous thrombosis.Breast carcinoma.  CT ANGIOGRAPHY CHEST  Technique:  Multidetector CT imaging of the chest using the standard protocol during bolus administration of intravenous contrast. Multiplanar reconstructed images including MIPs were obtained and reviewed to evaluate the vascular anatomy.  Contrast: OMNIPAQUE IOHEXOL 350 MG/ML SOLN  Comparison: PET  CT on 04/05/2011  Findings: Enlarged pulmonary embolism is seen nearly occluding the right pulmonary artery, with mild embolism is seen in the left upper and lower lobe pulmonary arteries.  No evidence of thoracic aortic aneurysm or dissection.  No evidence of hilar or mediastinal masses.  No lymphadenopathy identified within the thorax.  Postradiation changes are seen in the anterior left upper lobe.  No evidence of acute infiltrate or central endobronchial lesion.  No suspicious pulmonary nodules or masses are identified.  No evidence of pleural or pericardial effusion.  No evidence of axillary lymphadenopathy or chest wall mass.  Previous left mastectomy noted.  No suspicious bone lesions identified.  IMPRESSION:  1.  Positive for bilateral pulmonary embolism, right side greater than left. 2.  Left lung post radiation changes. 3.  No evidence of recurrent or metastatic carcinoma.  Critical Value/emergent results were called by telephone at the time of interpretation on 10/17/2012 at 2000 hours to Dr. Fonnie Jarvis in the emergency department, who verbally acknowledged these results.   Original Report Authenticated By: Myles Rosenthal, M.D.    Dg Chest Port 1 View  10/17/2012  *RADIOLOGY REPORT*  Clinical Data: Shortness of breath.  Weakness.  History of left  breast cancer.  PORTABLE CHEST - 1 VIEW  Comparison: 04/16/2011  Findings: The patient has a right-sided Port-A-Cath, tip to the level of the superior vena cava.  Surgical clips overlie the left axilla.  Heart size is upper limits normal.  There are no focal consolidations or pleural effusions.  No pulmonary edema.  IMPRESSION:  1. No evidence for acute cardiopulmonary abnormality.  2.  Right-sided Port-A-Cath.   Original Report Authenticated By: Norva Pavlov, M.D.      Principal Problem:   Pulmonary embolism Active Problems:   HYPOTHYROIDISM   HYPERLIPIDEMIA   OBESITY   HYPERTENSION   DEGENERATIVE JOINT DISEASE   Infiltrating ductal carcinoma of breast   Assessment/Plan Patient is a 79 year old women with pmh most significant for breast cancer, previous DVT who is being admitted for newly diagnosed PE.  #1Acute pulmonary embolism -Patient to be started on full dose lovenox for anticoagulation (malignancy) -IV fluids for 12 hours to support volume status and preload -No indications for antibiotics at this time -Most likely causes malignancy(no metastasis seen on CT chest) -hOld antihypertensives for now -I will also work the patient up for acute coronary syndrome given the history of chest pain which is nonpruritic(classically pleuritic pain with pulmonary embolism). I will order cardiac enzymes x3 and morning EKG. No EKG was ordered in the ER and I will order one stat right now.  #2acute respiratory insufficiency secondary to #1 -Continue O2 and monitoring in stepdown  Continue the rest of the medications for hypothyroidism, hyperlipidemia, degenerative joint disease and breast cancer at home doses.  Code Status: full dose Family Communication: no family present at baseline  Disposition Plan/Anticipated LOS: guarded  Time spent: 65 minutes  Lars Mage, MD  [facility] Team 5  If 7PM-7AM, please contact night-coverage at [email or web address], password [facility] 10/17/2012, 9:57  PM   The nurse from ER called about troponins being elevated to 0.86 which could be secondary to right heart strain and demand ischemia. Patient to be started on full dose anticoagulation. I will make sure that she is optimal medical therapy for CAD inclusing aspirin, statin, BB, nitro as needed For pain control. If troponins continue to rise, will call cardiology consult and consider tranfer to [facility] for closer monitoring.

## 2012-10-17 NOTE — ED Notes (Signed)
Lisa Klein, daughter, [phone], primary emergency contact.

## 2012-10-17 NOTE — ED Notes (Signed)
Implanted port in right chest accessed using sterile technique. Patient tolerated procedure well.

## 2012-10-17 NOTE — ED Provider Notes (Signed)
History  This chart was scribed for Lisa Horn, MD by Erskine Emery, ED Scribe. This patient was seen in room [facility] and the patient's care was started at 18:11.   CSN: [id number]  Arrival date & time 10/17/12  1754   First MD Initiated Contact with Patient 10/17/12 1811      Chief Complaint  Patient presents with  . Shortness of Breath    (Consider location/radiation/quality/duration/timing/severity/associated sxs/prior treatment) The history is provided by the patient. No language interpreter was used.   Lisa Klein is a 79 y.o. female who presents to the Emergency Department complaining of gradually worsening intermittent SOB and sometimes squeezing/pressure-like chest pain in episodes of a couple hours for the past few weeks, about every day. Today, the SOB is worse than usual and the pressure-like chest pain, still mild, has lasted over 6 hours. Pt reports some associated fatigue, weakness, and rare cough. She denies any confusion, emesis, fevers, numbness, or weakness on one side of her body. Pt reports her SOB is aggravated by activity. Pt dropped to 80% on 4L O2 upon exertion, when being helped into her hospital gown. Pt saw her PCP, Dr. Laurie Panda, last week for the same. He did an EKG which was negative. Pt is not on oxygen at home. Pt has a h/o blood clots in her legs several years ago but she has never had a blood clot in her lungs. Pt was taken off coumadin about a year ago. Pt was diagnosed with breast cancer (left breast) in 2012. Her last chemo treatment was about a year ago. Pt lives alone and usually is fine.  Past Medical History  Diagnosis Date  . DJD (degenerative joint disease)   . Hypertension   . Hypothyroidism   . Obesity   . Degenerative disc disease     with nerve compression   . Hypothyroidism   . Hypertension   . Clotting disorder 05/2011    dvt, right leg  . Cancer 2012    LEFT BREAST    Past Surgical History  Procedure Laterality Date  .  Vesicovaginal fistula closure w/ tah      [facility]  . Cholecystectomy  80'S    [facility]  . Mastectomy modified radical  02/14/11    left  . Portacath placement  04/16/2011    Procedure: INSERTION PORT-A-CATH;  Surgeon: Dalia Heading;  Location: [facility];  Service: General;  Laterality: Right;  right subclavian  . Cholecystectomy    . Breast surgery      left total mastectomy    Family History  Problem Relation Age of Onset  . COPD Father   . Lung disease Father   . Hypertension Sister   . Anesthesia problems Neg Hx   . Malignant hyperthermia Neg Hx   . Hypotension Neg Hx   . Pseudochol deficiency Neg Hx     History  Substance Use Topics  . Smoking status: Never Smoker   . Smokeless tobacco: Never Used  . Alcohol Use: No    OB History   Grav Para Term Preterm Abortions TAB SAB Ect Mult Living                  Review of Systems 10 Systems reviewed and are negative for acute change except as noted in the HPI.   Allergies  Review of patient's allergies indicates no known allergies.  Home Medications   No current outpatient prescriptions on file.  Triage Vitals: BP 130/78  Pulse 125  Temp(Src) 98 F (36.7 C) (Oral)  Resp 32  Ht 5\' 6"  (1.676 m)  Wt 198 lb (89.812 kg)  BMI 31.97 kg/m2  SpO2 91%  Physical Exam  Nursing note and vitals reviewed. Constitutional:  Awake, alert, nontoxic appearance.  HENT:  Head: Atraumatic.  Eyes: Right eye exhibits no discharge. Left eye exhibits no discharge.  Neck: Neck supple.  Cardiovascular: Regular rhythm and normal heart sounds.  Tachycardia present.   No murmur heard. Pulmonary/Chest: Effort normal. She has no wheezes. She exhibits no tenderness.  Lungs sound clear.  Abdominal: Soft. Bowel sounds are normal. There is no tenderness. There is no rebound.  Musculoskeletal: She exhibits no tenderness.  Baseline ROM, no obvious new focal weakness.  Neurological:  Mental status and motor strength appears baseline for patient and  situation.  Skin: No rash noted.  Psychiatric: She has a normal mood and affect.    ED Course  Procedures (including critical care time) DIAGNOSTIC STUDIES: Oxygen Saturation is 91% on [place] adequate in ED with supplemental O2, after hypoxic on room air by my interpretation.    COORDINATION OF CARE: 18:15--Patient / Family / Caregiver informed of clinical course, understand medical decision-making process, and agree with plan. Treatment plan includes: admission, chest x-ray, possible chest CT scan, blood work, and basic labs. (to r/o sepsis vs PE)  20:00--Consult with radiology (Dr. Myles Rosenthal) who called to notify me that the pt's chest CT shows no pneumonia but multiple pulmonary embolisms.   20:12-20:14--Consult with hospitalist to admit the pt.   20:15--I rechecked the pt and notified her of the results of her chest CT and that we would be admitting her.  CRITICAL CARE Performed by: Lisa Klein   Total critical care time:  Critical care time was exclusive of separately billable procedures and treating other patients.  Critical care was necessary to treat or prevent imminent or life-threatening deterioration.  Critical care was time spent personally by me on the following activities: development of treatment plan with patient and/or surrogate as well as nursing, discussions with consultants, evaluation of patient's response to treatment, examination of patient, obtaining history from patient or surrogate, ordering and performing treatments and interventions, ordering and review of laboratory studies, ordering and review of radiographic studies, pulse oximetry and re-evaluation of patient's condition.  Labs Reviewed  CBC WITH DIFFERENTIAL - Abnormal; Notable for the following:    Platelets 131 (*)    All other components within normal limits  COMPREHENSIVE METABOLIC PANEL - Abnormal; Notable for the following:    Glucose, Bld 124 (*)    GFR calc non Af Amer 77 (*)    GFR  calc Af Amer 89 (*)    All other components within normal limits  URINALYSIS, ROUTINE W REFLEX MICROSCOPIC - Abnormal; Notable for the following:    Hgb urine dipstick SMALL (*)    Leukocytes, UA SMALL (*)    All other components within normal limits  URINE MICROSCOPIC-ADD ON - Abnormal; Notable for the following:    Bacteria, UA FEW (*)    All other components within normal limits  TROPONIN I - Abnormal; Notable for the following:    Troponin I 1.43 (*)    All other components within normal limits  POCT I-STAT TROPONIN I - Abnormal; Notable for the following:    Troponin i, poc 0.86 (*)    All other components within normal limits  CULTURE, BLOOD (ROUTINE X 2)  CULTURE, BLOOD (ROUTINE X 2)  MRSA PCR SCREENING  URINE CULTURE  LACTIC ACID, PLASMA  PROCALCITONIN  PROTIME-INR  CBC WITH DIFFERENTIAL  TROPONIN I   Ct Angio Chest Pe W/cm &/or Wo Cm  10/17/2012  *RADIOLOGY REPORT*  Clinical Data: Acute shortness breath.  Deep venous thrombosis.Breast carcinoma.  CT ANGIOGRAPHY CHEST  Technique:  Multidetector CT imaging of the chest using the standard protocol during bolus administration of intravenous contrast. Multiplanar reconstructed images including MIPs were obtained and reviewed to evaluate the vascular anatomy.  Contrast: OMNIPAQUE IOHEXOL 350 MG/ML SOLN  Comparison: PET CT on 04/05/2011  Findings: Enlarged pulmonary embolism is seen nearly occluding the right pulmonary artery, with mild embolism is seen in the left upper and lower lobe pulmonary arteries.  No evidence of thoracic aortic aneurysm or dissection.  No evidence of hilar or mediastinal masses.  No lymphadenopathy identified within the thorax.  Postradiation changes are seen in the anterior left upper lobe.  No evidence of acute infiltrate or central endobronchial lesion.  No suspicious pulmonary nodules or masses are identified.  No evidence of pleural or pericardial effusion.  No evidence of axillary lymphadenopathy or  chest wall mass.  Previous left mastectomy noted.  No suspicious bone lesions identified.  IMPRESSION:  1.  Positive for bilateral pulmonary embolism, right side greater than left. 2.  Left lung post radiation changes. 3.  No evidence of recurrent or metastatic carcinoma.  Critical Value/emergent results were called by telephone at the time of interpretation on 10/17/2012 at 2000 hours to Dr. Fonnie Jarvis in the emergency department, who verbally acknowledged these results.   Original Report Authenticated By: Myles Rosenthal, M.D.    Dg Chest Port 1 View  10/17/2012  *RADIOLOGY REPORT*  Clinical Data: Shortness of breath.  Weakness.  History of left breast cancer.  PORTABLE CHEST - 1 VIEW  Comparison: 04/16/2011  Findings: The patient has a right-sided Port-A-Cath, tip to the level of the superior vena cava.  Surgical clips overlie the left axilla.  Heart size is upper limits normal.  There are no focal consolidations or pleural effusions.  No pulmonary edema.  IMPRESSION:  1. No evidence for acute cardiopulmonary abnormality.  2.  Right-sided Port-A-Cath.   Original Report Authenticated By: Norva Pavlov, M.D.      1. Pulmonary embolism, bilateral   2. UTI (urinary tract infection)   3. Unspecified essential hypertension   4. Unspecified hypothyroidism   5. Infiltrating ductal carcinoma of breast, unspecified laterality       MDM   The patient appears reasonably stabilized for admission with presentation most consistent with PEs considering the current resources, flow, and capabilities available in the ED at this time, and I doubt any other [facility] requiring further screening and/or treatment in the ED prior to admission.  I personally performed the services described in this documentation, which was scribed in my presence. The recorded information has been reviewed and is accurate.     Lisa Horn, MD 10/18/12 [phone]

## 2012-10-18 DIAGNOSIS — I369 Nonrheumatic tricuspid valve disorder, unspecified: Secondary | ICD-10-CM

## 2012-10-18 DIAGNOSIS — E039 Hypothyroidism, unspecified: Secondary | ICD-10-CM

## 2012-10-18 DIAGNOSIS — I10 Essential (primary) hypertension: Secondary | ICD-10-CM

## 2012-10-18 DIAGNOSIS — C50919 Malignant neoplasm of unspecified site of unspecified female breast: Secondary | ICD-10-CM

## 2012-10-18 DIAGNOSIS — I82409 Acute embolism and thrombosis of unspecified deep veins of unspecified lower extremity: Secondary | ICD-10-CM

## 2012-10-18 DIAGNOSIS — I2699 Other pulmonary embolism without acute cor pulmonale: Secondary | ICD-10-CM

## 2012-10-18 LAB — MRSA PCR SCREENING: MRSA by PCR: NEGATIVE

## 2012-10-18 MED ORDER — DOCUSATE SODIUM 100 MG PO CAPS
100.0000 mg | ORAL_CAPSULE | Freq: Every day | ORAL | Status: DC | PRN
  Administered 2012-10-18: 100 mg via ORAL
  Filled 2012-10-18 (×2): qty 1

## 2012-10-18 MED ORDER — NITROGLYCERIN 0.4 MG SL SUBL: 0.4000 mg | SUBLINGUAL_TABLET | SUBLINGUAL | Status: DC | PRN

## 2012-10-18 MED ORDER — DEXTROSE-NACL 5-0.45 % IV SOLN
INTRAVENOUS | Status: AC
  Administered 2012-10-18: 1000 mL via INTRAVENOUS

## 2012-10-18 MED ORDER — CEFTRIAXONE SODIUM 1 G IJ SOLR
1.0000 g | INTRAMUSCULAR | Status: DC
  Filled 2012-10-18 (×2): qty 10

## 2012-10-18 MED ORDER — ENOXAPARIN SODIUM 100 MG/ML ~~LOC~~ SOLN
85.0000 mg | Freq: Two times a day (BID) | SUBCUTANEOUS | Status: DC
  Administered 2012-10-18 – 2012-10-20 (×5): 85 mg via SUBCUTANEOUS
  Filled 2012-10-18 (×8): qty 1

## 2012-10-18 MED ORDER — LEVOTHYROXINE SODIUM 100 MCG PO TABS
100.0000 ug | ORAL_TABLET | Freq: Every day | ORAL | Status: DC
  Administered 2012-10-18 – 2012-10-22 (×5): 100 ug via ORAL
  Filled 2012-10-18 (×7): qty 1

## 2012-10-18 MED ORDER — CARVEDILOL 3.125 MG PO TABS
3.1250 mg | ORAL_TABLET | Freq: Two times a day (BID) | ORAL | Status: DC
  Administered 2012-10-18 (×2): 3.125 mg via ORAL
  Filled 2012-10-18 (×4): qty 1

## 2012-10-18 MED ORDER — DEXTROSE 5 % IV SOLN
500.0000 mg | INTRAVENOUS | Status: DC
  Filled 2012-10-18 (×2): qty 500

## 2012-10-18 MED ORDER — OXYCODONE HCL 5 MG PO TABS: 5.0000 mg | ORAL_TABLET | ORAL | Status: DC | PRN

## 2012-10-18 MED ORDER — ONDANSETRON HCL 4 MG PO TABS: 4.0000 mg | ORAL_TABLET | Freq: Four times a day (QID) | ORAL | Status: DC | PRN

## 2012-10-18 MED ORDER — WARFARIN - PHARMACIST DOSING INPATIENT: Freq: Every day | Status: DC

## 2012-10-18 MED ORDER — WARFARIN SODIUM 5 MG PO TABS
5.0000 mg | ORAL_TABLET | Freq: Once | ORAL | Status: AC
  Administered 2012-10-18: 5 mg via ORAL
  Filled 2012-10-18: qty 1

## 2012-10-18 MED ORDER — CALCIUM CARBONATE-VITAMIN D 500-200 MG-UNIT PO TABS
1.0000 | ORAL_TABLET | Freq: Every morning | ORAL | Status: DC
  Administered 2012-10-18 – 2012-10-22 (×5): 1 via ORAL
  Filled 2012-10-18 (×9): qty 1

## 2012-10-18 MED ORDER — SIMVASTATIN 10 MG PO TABS
10.0000 mg | ORAL_TABLET | Freq: Every day | ORAL | Status: DC
  Administered 2012-10-18 – 2012-10-21 (×4): 10 mg via ORAL
  Filled 2012-10-18 (×5): qty 1

## 2012-10-18 MED ORDER — WARFARIN VIDEO
Freq: Once | Status: AC
  Administered 2012-10-18: 10:00:00

## 2012-10-18 MED ORDER — SODIUM CHLORIDE 0.9 % IJ SOLN
3.0000 mL | Freq: Two times a day (BID) | INTRAMUSCULAR | Status: DC
  Administered 2012-10-18 – 2012-10-22 (×9): 3 mL via INTRAVENOUS

## 2012-10-18 MED ORDER — ASPIRIN EC 81 MG PO TBEC
81.0000 mg | DELAYED_RELEASE_TABLET | Freq: Every day | ORAL | Status: DC
  Administered 2012-10-18 – 2012-10-22 (×5): 81 mg via ORAL
  Filled 2012-10-18 (×5): qty 1

## 2012-10-18 MED ORDER — ONDANSETRON HCL 4 MG/2ML IJ SOLN: 4.0000 mg | Freq: Four times a day (QID) | INTRAMUSCULAR | Status: DC | PRN

## 2012-10-18 MED ORDER — ANASTROZOLE 1 MG PO TABS
1.0000 mg | ORAL_TABLET | Freq: Every day | ORAL | Status: DC
  Administered 2012-10-18 – 2012-10-21 (×5): 1 mg via ORAL
  Filled 2012-10-18 (×8): qty 1

## 2012-10-18 MED ORDER — COUMADIN BOOK
Freq: Once | Status: AC
  Administered 2012-10-18: 09:00:00
  Filled 2012-10-18: qty 1

## 2012-10-18 NOTE — Progress Notes (Addendum)
[facility] Progress Note [place] TEAM 1 - Stepdown/ICU TEAM   Lisa Klein WUJ:[id number] DOB: 04-01-1930 DOA: 10/17/2012 PCP: [name], MD  Brief narrative: This is an 79 year old female who presented to the any pain ER with a complaint of shortness of breath and chest pressure. She was walking to her mailbox on the day of admission and became extremely short of breath and had to rest in her car. She was brought to the ER and workup revealed multiple pulmonary emboli-right greater than left. Further workup revealed elevated troponins. She was transferred to [facility] for further care. She follows with Dr. Mariel Sleet as an outpatient for previous DVTs and was taken off of Coumadin about a year ago.  Assessment/Plan: Principal Problem:   Pulmonary emboli -With elevated troponin suggesting right heart strain -Will order 2-D echo -Will see if she can afford Xarelto and initiate this if so -Lower extremity Doppler reveals left leg DVT in posterior tibial and peroneal veins  Active Problems:   HYPOTHYROIDISM Continue Levothroid    HYPERLIPIDEMIA Continue Zocor    OBESITY Advised to lose weight    HYPERTENSION BP borderline Hold HCTZ    DEGENERATIVE JOINT DISEASE Currently no complaints When necessary acetaminophen if needed    Infiltrating ductal carcinoma of breast -Status post modified radical mastectomy on 7/12 followed by chemotherapy radiation and now on Arimidex for a total of 5 years minimum   Code Status: Full code Family Communication: Discussed in detail with daughter at bedside Disposition Plan: Continue to follow in step down Consultants: None  Procedures: None  Antibiotics: None  DVT prophylaxis: Heparin infusion  HPI/Subjective: Patient has some complaint of mild right-sided chest pain which is worse when taking deep breaths. No current shortness of breath. She has not noted any swelling of her lower extremities lately.  Her previous blood clots were in her right leg.  Objective: Blood pressure 118/72, pulse 87, temperature 98.7 F (37.1 C), temperature source Axillary, resp. rate 23, height 5\' 6"  (1.676 m), weight 85 kg (187 lb 6.3 oz), SpO2 94.00%.  Intake/Output Summary (Last 24 hours) at 10/18/12 1144 Last data filed at 10/18/12 1000  Gross per 24 hour  Intake 1176.25 ml  Output   1475 ml  Net -298.75 ml     Exam: General: Awake alert oriented x3, No acute respiratory distress Lungs: Clear to auscultation bilaterally without wheezes or crackles Cardiovascular: Regular rate and rhythm without murmur gallop or rub normal S1 and S2 Abdomen: Nontender, nondistended, soft, bowel sounds positive, no rebound, no ascites, no appreciable mass Extremities: No significant cyanosis, clubbing- there is mild edema of the left lower extremity-no pitting noted to  Data Reviewed: Basic Metabolic Panel:  Recent Labs Lab 10/17/12 1834  NA 136  K 3.5  CL 98  CO2 24  GLUCOSE 124*  BUN 12  CREATININE 0.78  CALCIUM 9.5   Liver Function Tests:  Recent Labs Lab 10/17/12 1834  AST 22  ALT 16  ALKPHOS 84  BILITOT 0.4  PROT 7.2  ALBUMIN 3.5   No results found for this basename: LIPASE, AMYLASE,  in the last 168 hours No results found for this basename: AMMONIA,  in the last 168 hours CBC:  Recent Labs Lab 10/17/12 1834  WBC 5.5  NEUTROABS 3.8  HGB 12.7  HCT 38.4  MCV 88.1  PLT 131*   Cardiac Enzymes:  Recent Labs Lab 10/18/12 0117  TROPONINI 1.43*   BNP (last 3 results) No results found for this  basename: PROBNP,  in the last 8760 hours CBG: No results found for this basename: GLUCAP,  in the last 168 hours  Recent Results (from the past 240 hour(s))  CULTURE, BLOOD (ROUTINE X 2)     Status: None   Collection Time    10/17/12  6:34 PM      Result Value Range Status   Specimen Description CHEST PORTA CATH RIGHT   Final   Special Requests BOTTLES DRAWN AEROBIC AND ANAEROBIC 8CC  EACH   Final   Culture NO GROWTH 1 DAY   Final   Report Status PENDING   Incomplete  CULTURE, BLOOD (ROUTINE X 2)     Status: None   Collection Time    10/17/12  7:43 PM      Result Value Range Status   Specimen Description PORTA CATH   Final   Special Requests BOTTLES DRAWN AEROBIC ONLY 8CC   Final   Culture NO GROWTH 1 DAY   Final   Report Status PENDING   Incomplete  MRSA PCR SCREENING     Status: None   Collection Time    10/18/12  1:20 AM      Result Value Range Status   MRSA by PCR NEGATIVE  NEGATIVE Final   Comment:            The GeneXpert MRSA Assay (FDA     approved for NASAL specimens     only), is one component of a     comprehensive MRSA colonization     surveillance program. It is not     intended to diagnose MRSA     infection nor to guide or     monitor treatment for     MRSA infections.     Studies:  Recent x-ray studies have been reviewed in detail by the Attending Physician  Scheduled Meds:  Scheduled Meds: . anastrozole  1 mg Oral QHS  . aspirin EC  81 mg Oral Daily  . calcium-vitamin D  1 tablet Oral q morning - 10a  . enoxaparin (LOVENOX) injection  85 mg Subcutaneous Q12H  . levothyroxine  100 mcg Oral QAC breakfast  . simvastatin  10 mg Oral q1800  . sodium chloride  3 mL Intravenous Q12H  . warfarin  5 mg Oral ONCE-1800  . warfarin   Does not apply Once  . Warfarin - Pharmacist Dosing Inpatient   Does not apply q1800   Continuous Infusions: . sodium chloride 1,000 mL (10/18/12 0509)    Time spent on care of this patient: 35 minute   [facility]  [facility] Office  [phone] Pager - Text Page per Loretha Stapler as per below:  On-Call/Text Page:      [email or web address]      password [email or web address]  If 7PM-7AM, please contact night-coverage [email or web address] Password [facility] 10/18/2012, 11:44 AM   LOS: 1 day

## 2012-10-18 NOTE — Progress Notes (Signed)
  Echocardiogram 2D Echocardiogram has been performed.  Lisa Klein 10/18/2012, 11:37 AM

## 2012-10-18 NOTE — Progress Notes (Signed)
ANTICOAGULATION CONSULT NOTE - Initial Consult  Pharmacy Consult for Lovenox and Coumadin Indication: pulmonary embolus  No Known Allergies  Patient Measurements: Height: 5\' 6"  (167.6 cm) Weight: 187 lb 6.3 oz (85 kg) IBW/kg (Calculated) : 59.3  Vital Signs: Temp: 98.5 F (36.9 C) (03/15 0442) Temp src: Oral (03/15 0442) BP: 140/73 mmHg (03/15 0530) Pulse Rate: 88 (03/15 0530)  Labs:  Recent Labs  10/17/12 1834 10/18/12 0117  HGB 12.7  --   HCT 38.4  --   PLT 131*  --   LABPROT 14.5  --   INR 1.15  --   CREATININE 0.78  --   TROPONINI  --  1.43*    Estimated Creatinine Clearance: 61.6 ml/min (by C-G formula based on Cr of 0.78).   Medical History: Past Medical History  Diagnosis Date  . DJD (degenerative joint disease)   . Hypertension   . Hypothyroidism   . Obesity   . Degenerative disc disease     with nerve compression   . Hypothyroidism   . Hypertension   . Clotting disorder 05/2011    dvt, right leg  . Cancer 2012    LEFT BREAST    Medications:  Prescriptions prior to admission  Medication Sig Dispense Refill  . acetaminophen (TYLENOL) 650 MG CR tablet Take 650 mg by mouth daily as needed for pain.       [name] anastrozole (ARIMIDEX) 1 MG tablet Take 1 mg by mouth at bedtime.      . calcium-vitamin D (OSCAL 500/200 D-3) 500-200 MG-UNIT per tablet Take 1 tablet by mouth every morning.       . Cholecalciferol (VITAMIN D3) 1000 UNITS CAPS Take 1,000 Units by mouth every morning.       . docusate sodium (COLACE) 100 MG capsule Take 100 mg by mouth daily as needed for constipation. For constipation      . hydrochlorothiazide (MICROZIDE) 12.5 MG capsule Take 12.5 mg by mouth every morning.      [name] levothyroxine (SYNTHROID, LEVOTHROID) 100 MCG tablet Take 100 mcg by mouth every morning.      . lovastatin (MEVACOR) 20 MG tablet Take 1 tablet (20 mg total) by mouth at bedtime.  90 tablet  0  . potassium chloride (K-DUR,KLOR-CON) 10 MEQ tablet Take 1 tablet (10  mEq total) by mouth 2 (two) times daily.  180 tablet  0   Scheduled:  . [COMPLETED] sodium chloride  1,000 mL Intravenous Once   Followed by  . [COMPLETED] sodium chloride  1,000 mL Intravenous Once  . anastrozole  1 mg Oral QHS  . aspirin EC  81 mg Oral Daily  . [COMPLETED] azithromycin  500 mg Intravenous Once  . calcium-vitamin D  1 tablet Oral q morning - 10a  . carvedilol  3.125 mg Oral BID WC  . [COMPLETED] cefTRIAXone (ROCEPHIN)  IV  1 g Intravenous Once  . coumadin book   Does not apply Once  . [COMPLETED] dextrose 5 % and 0.45% NaCl   Intravenous STAT  . enoxaparin (LOVENOX) injection  85 mg Subcutaneous Q12H  . [COMPLETED] enoxaparin (LOVENOX) injection  1 mg/kg Subcutaneous Once  . levothyroxine  100 mcg Oral QAC breakfast  . simvastatin  10 mg Oral q1800  . sodium chloride  3 mL Intravenous Q12H  . warfarin  5 mg Oral ONCE-1800  . warfarin   Does not apply Once  . Warfarin - Pharmacist Dosing Inpatient   Does not apply q1800  . [DISCONTINUED] azithromycin  500 mg Intravenous Q24H  . [DISCONTINUED] cefTRIAXone (ROCEPHIN)  IV  1 g Intravenous Q24H    Assessment: 79yo female c/o SOB and mild CP with back pain on inspiration, CT reveals bilateral PE, troponin also elevated but likely due to heart strain from PE, to begin full anticoagulation.  Goal of Therapy:  INR 2-3 Heparin level 0.3-0.7 units/ml Monitor platelets by anticoagulation protocol: Yes   Plan:  Rec'd Lovenox 90mg  at [facility]; will continue with Lovenox 85mg  SQ Q12H and monitor CBC; will give Coumadin 5mg  po x1 today and monitor INR for dose adjustments; will begin Coumadin education.  Vernard Gambles, PharmD, BCPS  10/18/2012,6:10 AM

## 2012-10-18 NOTE — Progress Notes (Signed)
Left via stretcher with [facility]

## 2012-10-18 NOTE — ED Notes (Signed)
Patient resting comfortably in bed with eyes closed.  Patient remains on NRB with O2 sats at 98%.

## 2012-10-18 NOTE — Progress Notes (Signed)
   CARE MANAGEMENT NOTE 10/18/2012  Patient:  Lisa Klein, Lisa Klein   Account Number:  [id number]  Date Initiated:  10/18/2012  Documentation initiated by:  [facility]  Subjective/Objective Assessment:   DVT     Action/Plan:   Anticipated DC Date:  10/21/2012   Anticipated DC Plan:  HOME W HOME HEALTH SERVICES      DC Planning Services  CM consult  Medication Assistance      Choice offered to / List presented to:             Status of service:  In process, will continue to follow Medicare Important Message given?   (If response is "NO", the following Medicare IM given date fields will be blank) Date Medicare IM given:   Date Additional Medicare IM given:    Discharge Disposition:    Per UR Regulation:    If discussed at Long Length of Stay Meetings, dates discussed:    Comments:  10/18/2012 1600 NCM spoke to pt and states she uses [facility] mail order pharmacy. She uses [facility] in [place] sometimes for medications. Contacted [employer] to verify copay for Xarelto. Customer Services is unavailable due to training. Will follow up on 3/16. Pt will receive 10 day free trial of Xarelto. NCM notified attending. Isidoro Donning RN CCM Case Mgmt phone [phone]  Xarelto 15 mg bid for 21 days Xarelto 20 mg qd

## 2012-10-18 NOTE — ED Notes (Signed)
Patient placed back on NRB for continued decrease of O2 sats to 83%.

## 2012-10-18 NOTE — Progress Notes (Signed)
Had called md to inform of elevated troponin of 1.43 at 0200. Pt to be transferred to [place] via [facility].  explained transfer to patient and she did sign transfer form

## 2012-10-18 NOTE — Progress Notes (Signed)
Pt and family watching " Blood Thinners " video .

## 2012-10-18 NOTE — Progress Notes (Signed)
Report given to [facility] 

## 2012-10-18 NOTE — Progress Notes (Signed)
Transfer note/progress note  Called by the nurse from ICU regarding patient's troponins being elevated to 1.43. Patient complaining of constant 3/10 chest pain. Her most recent vital signs suggest blood pressure drop as compared to her admission vitals. BP 103/67  Pulse 99  Temp(Src) 98.2 F (36.8 C) (Oral)  Resp 26  Ht 5\' 6"  (1.676 m)  Wt 198 lb (89.812 kg)  BMI 31.97 kg/m2  SpO2 95% As discussed in my original H&P. Patient probably has a big embolus which is causing a significant right heart strain, demand ischemia and hypotension. I discussed the case with Dr. Frederico Hamman, critical care attending in [place] and we both agree that patient would be better served in ICU at [facility] for at least next 24 hours. I have initiated the transfer process and called up care link regarding the same. Patient already on optimal medical therapy from cardiac standpoint. I considered starting her on nitroglycerin drip for chest pain but considering her blood pressure 100s over 60s, I would hold at this time.   Lars Mage MD Faculty-Internal Medicine Residency Program Pager [phone] 2:29 AM 10/18/2012

## 2012-10-18 NOTE — Progress Notes (Signed)
[facility] progress note. Chief complaint. Transfer note. History of present illness. This 79 year old female was seen and admitted to the [facility] earlier this a.m. She presented with complaints of dyspnea and CT scan of the chest indicated acute pulmonary emboli. Her case was discussed with PCCM at [facility] and it was agreed to transfer the patient here. She has now arrived in transfer and is not felt to require PCCM services at this time. She will be admitted under [facility] hospital[facility] and I'm seeing the patient at bedside to ensure she remains stable post transfer and that her orders have transferred appropriately. Patient has no current complaints of chest pain or dyspnea. She states her dyspnea is mainly with exertion. Vital signs. Temperature 98.5, pulse 88, respirations 27, blood pressure 140/73. O2 sats 96% on low-flow nasal cannula oxygen. General appearance. Well-developed elderly female who is alert, cooperative, in no distress. Cardiac. Rate and rhythm regular. Lungs. Breath sounds clear and equal. Abdomen. Soft with positive bowel sounds. Impression/plan. Problem #1. Pulmonary emboli. I've added orders for pharmacy consult to dose Lovenox as a bridge to Coumadin. Patient to currently appears clinically stable and in no distress. Problem #2. Elevated troponin. First of 3 troponin noted elevated 1.43. This was felt secondary to heart strain do to problem #1. I have ordered a repeat EKG to further monitor and second troponin is still pending at this time. Patient appears clinically stable post transfer and all orders appear to transferred appropriately.

## 2012-10-18 NOTE — Progress Notes (Signed)
PT Cancellation Note  Patient Details Name: JAQUANA GEIGER MRN: [id number] DOB: [birth year]   Cancelled Treatment:    Reason Eval/Treat Not Completed: Medical issues which prohibited therapy (awaiting troponin results, new DVT)   Olivia Canter 10/18/2012, 10:38 AM

## 2012-10-18 NOTE — Plan of Care (Addendum)
Problem: Phase II Progression Outcomes Goal: ADLs completed with minimal assistance Outcome: Not Progressing Still has DOE,fatigues easily,is on strict bedrest Goal: activity is at baseline Not progressing due to DOE ,O2 is at 4L/[place]  Outcome: Not Progressing LLE U.S. Indicates numerous thrombi-- Pt is on bedrest Goal: Tolerating diet Outcome: Not Progressing Poor appetite--Pt accepting "Breeze" beverages

## 2012-10-18 NOTE — Progress Notes (Signed)
Report called to receiving RN, Holly.

## 2012-10-18 NOTE — Progress Notes (Signed)
VASCULAR LAB PRELIMINARY  PRELIMINARY  PRELIMINARY  PRELIMINARY  Bilateral lower extremity venous Dopplers completed.    Preliminary report:  There is acute, occlusive DVT noted in the left posterior tibial and peroneal veins.  There is acute, non occlusive DVT noted in the left popliteal and femoral veins.  There is no DVT noted in the right lower extremity.  Estefan Pattison, RVT 10/18/2012, 10:08 AM

## 2012-10-19 DIAGNOSIS — I82409 Acute embolism and thrombosis of unspecified deep veins of unspecified lower extremity: Secondary | ICD-10-CM

## 2012-10-19 LAB — CBC
HCT: 30.9 % — ABNORMAL LOW (ref 36.0–46.0)
Hemoglobin: 10.2 g/dL — ABNORMAL LOW (ref 12.0–15.0)
MCH: 29.1 pg (ref 26.0–34.0)
MCV: 88.3 fL (ref 78.0–100.0)
Platelets: 115 10*3/uL — ABNORMAL LOW (ref 150–400)
RBC: 3.5 MIL/uL — ABNORMAL LOW (ref 3.87–5.11)
WBC: 4.3 10*3/uL (ref 4.0–10.5)

## 2012-10-19 MED ORDER — WARFARIN SODIUM 5 MG PO TABS
5.0000 mg | ORAL_TABLET | Freq: Once | ORAL | Status: AC
  Administered 2012-10-19: 5 mg via ORAL
  Filled 2012-10-19: qty 1

## 2012-10-19 MED ORDER — POTASSIUM CHLORIDE CRYS ER 10 MEQ PO TBCR
10.0000 meq | EXTENDED_RELEASE_TABLET | Freq: Two times a day (BID) | ORAL | Status: DC
  Administered 2012-10-19 – 2012-10-22 (×7): 10 meq via ORAL
  Filled 2012-10-19 (×8): qty 1

## 2012-10-19 MED ORDER — HYDROCHLOROTHIAZIDE 12.5 MG PO CAPS
12.5000 mg | ORAL_CAPSULE | Freq: Every morning | ORAL | Status: DC
  Administered 2012-10-19 – 2012-10-22 (×4): 12.5 mg via ORAL
  Filled 2012-10-19 (×4): qty 1

## 2012-10-19 NOTE — Progress Notes (Addendum)
[facility] Progress Note [place] TEAM 1 - Stepdown/ICU TEAM   CALLISTA HOH WUJ:[id number] DOB: [birth year] DOA: 10/17/2012 PCP: [name], MD  Brief narrative: This is an 79 year old female who presented to the any pain ER with a complaint of shortness of breath and chest pressure. She was walking to her mailbox on the day of admission and became extremely short of breath and had to rest in her car. She was brought to the ER and workup revealed multiple pulmonary emboli-right greater than left. Further workup revealed elevated troponins. She was transferred to [facility] for further care. She follows with Dr. Mariel Sleet as an outpatient for previous DVTs and was taken off of Coumadin about a year ago.  Assessment/Plan: Principal Problem:   Pulmonary emboli -With elevated troponin suggesting right heart strain - 2-D echo completed-RV poorly visualized but apparently size and function are normal-pulmonary systolic pressure is noted to be upper limit of normal -Lower extremity Doppler reveals left leg DVT in posterior tibial and peroneal veins -Awaiting case management to communicate with patient's insurance to decide upon initiation of Xarelto - In the Interim, continue coumadin  Active Problems:  Tricuspid Regurgitation -Previously mid- now noted to be moderate per ECHO completed yesterday  Elevated Troponins -Likely from right heart strain -No LV wall motion abnormalities noted on ECHO    HYPOTHYROIDISM -Continue Levothroid    HYPERLIPIDEMIA -Continue Zocor    OBESITY -Advised to lose weight    HYPERTENSION -BP improved -Resume HCTZ    DEGENERATIVE JOINT DISEASE -Currently no complaints -When necessary acetaminophen if needed    Infiltrating ductal carcinoma of breast -Status post modified radical mastectomy on 7/12 followed by chemotherapy radiation and now on Arimidex for a total of 5 years minimum   Code Status: Full code Family  Communication: Discussed in detail with daughter at bedside Disposition Plan: Continue to follow in step down Consultants: None  Procedures: None  Antibiotics: None  DVT prophylaxis: Heparin infusion  HPI/Subjective: Patient's chest pain is improving and now mostly a soreness. No dyspnea.   Objective: Blood pressure 152/77, pulse 97, temperature 98.9 F (37.2 C), temperature source Oral, resp. rate 21, height 5\' 6"  (1.676 m), weight 86.2 kg (190 lb 0.6 oz), SpO2 98.00%.  Intake/Output Summary (Last 24 hours) at 10/19/12 1634 Last data filed at 10/19/12 1200  Gross per 24 hour  Intake   1625 ml  Output   1200 ml  Net    425 ml     Exam: General: Awake alert oriented x3, No acute respiratory distress Lungs: Clear to auscultation bilaterally without wheezes or crackles Cardiovascular: Regular rate and rhythm without murmur gallop or rub normal S1 and S2 Abdomen: Nontender, nondistended, soft, bowel sounds positive, no rebound, no ascites, no appreciable mass Extremities: No significant cyanosis, clubbing- there is mild edema of the left lower extremity-no pitting noted to  Data Reviewed: Basic Metabolic Panel:  Recent Labs Lab 10/17/12 1834  NA 136  K 3.5  CL 98  CO2 24  GLUCOSE 124*  BUN 12  CREATININE 0.78  CALCIUM 9.5   Liver Function Tests:  Recent Labs Lab 10/17/12 1834  AST 22  ALT 16  ALKPHOS 84  BILITOT 0.4  PROT 7.2  ALBUMIN 3.5   No results found for this basename: LIPASE, AMYLASE,  in the last 168 hours No results found for this basename: AMMONIA,  in the last 168 hours CBC:  Recent Labs Lab 10/17/12 1834 10/19/12 0500  WBC 5.5 4.3  NEUTROABS 3.8  --   HGB 12.7 10.2*  HCT 38.4 30.9*  MCV 88.1 88.3  PLT 131* 115*   Cardiac Enzymes:  Recent Labs Lab 10/18/12 0117  TROPONINI 1.43*   BNP (last 3 results) No results found for this basename: PROBNP,  in the last 8760 hours CBG: No results found for this basename: GLUCAP,  in  the last 168 hours  Recent Results (from the past 240 hour(s))  CULTURE, BLOOD (ROUTINE X 2)     Status: None   Collection Time    10/17/12  6:34 PM      Result Value Range Status   Specimen Description CHEST PORTA CATH RIGHT   Final   Special Requests BOTTLES DRAWN AEROBIC AND ANAEROBIC 8CC EACH   Final   Culture NO GROWTH 2 DAYS   Final   Report Status PENDING   Incomplete  CULTURE, BLOOD (ROUTINE X 2)     Status: None   Collection Time    10/17/12  7:43 PM      Result Value Range Status   Specimen Description PORTA CATH   Final   Special Requests BOTTLES DRAWN AEROBIC ONLY 8CC   Final   Culture NO GROWTH 2 DAYS   Final   Report Status PENDING   Incomplete  MRSA PCR SCREENING     Status: None   Collection Time    10/18/12  1:20 AM      Result Value Range Status   MRSA by PCR NEGATIVE  NEGATIVE Final   Comment:            The GeneXpert MRSA Assay (FDA     approved for NASAL specimens     only), is one component of a     comprehensive MRSA colonization     surveillance program. It is not     intended to diagnose MRSA     infection nor to guide or     monitor treatment for     MRSA infections.     Studies:  Recent x-ray studies have been reviewed in detail by the Attending Physician  Scheduled Meds:  Scheduled Meds: . anastrozole  1 mg Oral QHS  . aspirin EC  81 mg Oral Daily  . calcium-vitamin D  1 tablet Oral q morning - 10a  . enoxaparin (LOVENOX) injection  85 mg Subcutaneous Q12H  . hydrochlorothiazide  12.5 mg Oral q morning - 10a  . levothyroxine  100 mcg Oral QAC breakfast  . potassium chloride  10 mEq Oral BID  . simvastatin  10 mg Oral q1800  . sodium chloride  3 mL Intravenous Q12H  . warfarin  5 mg Oral ONCE-1800  . Warfarin - Pharmacist Dosing Inpatient   Does not apply q1800   Continuous Infusions:    Time spent on care of this patient: 35 minute   [facility]  [facility] Office  [phone] Pager - Text Page per Loretha Stapler as per  below:  On-Call/Text Page:      [email or web address]      password [email or web address]  If 7PM-7AM, please contact night-coverage [email or web address] Password [facility] 10/19/2012, 4:34 PM   LOS: 2 days

## 2012-10-19 NOTE — Progress Notes (Signed)
Utilization Review Completed.Lisa Klein T3/16/2014  

## 2012-10-19 NOTE — Progress Notes (Signed)
ANTICOAGULATION CONSULT NOTE - Follow Up Consult  Pharmacy Consult for Lovenox and Coumadin Indication: pulmonary embolus  No Known Allergies  Patient Measurements: Height: 5\' 6"  (167.6 cm) Weight: 190 lb 0.6 oz (86.2 kg) IBW/kg (Calculated) : 59.3  Vital Signs: Temp: 98.9 F (37.2 C) (03/16 1211) Temp src: Oral (03/16 1211) BP: 152/77 mmHg (03/16 1211) Pulse Rate: 97 (03/16 1211)  Labs:  Recent Labs  10/17/12 1834 10/18/12 0117 10/19/12 0500  HGB 12.7  --  10.2*  HCT 38.4  --  30.9*  PLT 131*  --  115*  LABPROT 14.5  --  15.5*  INR 1.15  --  1.25  CREATININE 0.78  --   --   TROPONINI  --  1.43*  --     Estimated Creatinine Clearance: 62.1 ml/min (by C-G formula based on Cr of 0.78).   Medical History: Past Medical History  Diagnosis Date  . DJD (degenerative joint disease)   . Hypertension   . Hypothyroidism   . Obesity   . Degenerative disc disease     with nerve compression   . Hypothyroidism   . Hypertension   . Clotting disorder 05/2011    dvt, right leg  . Cancer 2012    LEFT BREAST    Medications:  Prescriptions prior to admission  Medication Sig Dispense Refill  . acetaminophen (TYLENOL) 650 MG CR tablet Take 650 mg by mouth daily as needed for pain.       [name] anastrozole (ARIMIDEX) 1 MG tablet Take 1 mg by mouth at bedtime.      . calcium-vitamin D (OSCAL 500/200 D-3) 500-200 MG-UNIT per tablet Take 1 tablet by mouth every morning.       . Cholecalciferol (VITAMIN D3) 1000 UNITS CAPS Take 1,000 Units by mouth every morning.       . docusate sodium (COLACE) 100 MG capsule Take 100 mg by mouth daily as needed for constipation. For constipation      . hydrochlorothiazide (MICROZIDE) 12.5 MG capsule Take 12.5 mg by mouth every morning.      [name] levothyroxine (SYNTHROID, LEVOTHROID) 100 MCG tablet Take 100 mcg by mouth every morning.      . lovastatin (MEVACOR) 20 MG tablet Take 1 tablet (20 mg total) by mouth at bedtime.  90 tablet  0  . potassium  chloride (K-DUR,KLOR-CON) 10 MEQ tablet Take 1 tablet (10 mEq total) by mouth 2 (two) times daily.  180 tablet  0   Scheduled:  . anastrozole  1 mg Oral QHS  . aspirin EC  81 mg Oral Daily  . calcium-vitamin D  1 tablet Oral q morning - 10a  . enoxaparin (LOVENOX) injection  85 mg Subcutaneous Q12H  . hydrochlorothiazide  12.5 mg Oral q morning - 10a  . levothyroxine  100 mcg Oral QAC breakfast  . potassium chloride  10 mEq Oral BID  . simvastatin  10 mg Oral q1800  . sodium chloride  3 mL Intravenous Q12H  . [COMPLETED] warfarin  5 mg Oral ONCE-1800  . Warfarin - Pharmacist Dosing Inpatient   Does not apply q1800    Assessment: 79yo female c/o SOB and mild CP with back pain on inspiration, CT reveals bilateral PE, troponin also elevated but likely due to heart strain from PE, to begin full anticoagulation. INR today 1.25  Goal of Therapy:  INR 2-3 Heparin level 0.3-0.7 units/ml Monitor platelets by anticoagulation protocol: Yes   Plan:  Will continue with Lovenox 85mg  SQ Q12H and  monitor CBC. Will give Coumadin 5mg  po x1 today and monitor INR for dose adjustments   Talbert Cage PharmD 10/19/2012,1:07 PM

## 2012-10-20 LAB — URINE CULTURE

## 2012-10-20 LAB — PROTIME-INR
INR: 1.22 (ref 0.00–1.49)
Prothrombin Time: 15.2 seconds (ref 11.6–15.2)

## 2012-10-20 MED ORDER — RIVAROXABAN 20 MG PO TABS: 20.0000 mg | ORAL_TABLET | Freq: Every day | ORAL | Status: DC

## 2012-10-20 MED ORDER — RIVAROXABAN 15 MG PO TABS
15.0000 mg | ORAL_TABLET | Freq: Two times a day (BID) | ORAL | Status: DC
  Filled 2012-10-20 (×2): qty 1

## 2012-10-20 MED ORDER — RIVAROXABAN 15 MG PO TABS
15.0000 mg | ORAL_TABLET | Freq: Two times a day (BID) | ORAL | Status: DC
  Administered 2012-10-20 – 2012-10-22 (×4): 15 mg via ORAL
  Filled 2012-10-20 (×6): qty 1

## 2012-10-20 NOTE — Progress Notes (Signed)
ANTICOAGULATION CONSULT NOTE - Follow Up Consult  Pharmacy Consult for convert Lovenox and Coumadin to xarelto Indication: pulmonary embolus  No Known Allergies  Patient Measurements: Height: 5\' 6"  (167.6 cm) Weight: 190 lb 0.6 oz (86.2 kg) IBW/kg (Calculated) : 59.3  Vital Signs: Temp: 99.2 F (37.3 C) (03/17 0437) Temp src: Oral (03/17 0437) BP: 153/88 mmHg (03/17 0437) Pulse Rate: 86 (03/17 0437)  Labs:  Recent Labs  10/17/12 1834 10/18/12 0117 10/19/12 0500 10/20/12 0500  HGB 12.7  --  10.2*  --   HCT 38.4  --  30.9*  --   PLT 131*  --  115*  --   LABPROT 14.5  --  15.5* 15.2  INR 1.15  --  1.25 1.22  CREATININE 0.78  --   --   --   TROPONINI  --  1.43*  --   --     Estimated Creatinine Clearance: 62.1 ml/min (by C-G formula based on Cr of 0.78).    Assessment: 79yo female on LMWH and coumadin for newly diagnosed multiple PEs.  She also has L LE DVT.  Her last dose of LMWH 85 mg was today at 920 am.  Her INR is 1.22 after 2 doses of coumadin 5 mg.  She has agreed to the $45/month copay for xarelto per the case manager.  Dr. Sharon Seller wants to transition her to xarelto.  Her creat cl is ~ 62 ml/min.    Plan:  1. DC LMWH and coumadin 2. xarelto 15 mg po BID with food x 21 days then xarelto 20 mg po qday with supper  Herby Abraham, Pharm.D. [phone] 10/20/2012 10:36 AM

## 2012-10-20 NOTE — Progress Notes (Signed)
Weaned pt off of O2. O2 sats maintained above 90% during ambulation. Pt walked 300 ft with walker. Upon arrival into room O2 sat was 95% and pt sat in chair. Will continue to monitor pt closely.

## 2012-10-20 NOTE — Progress Notes (Signed)
10/20/2012 0920 Benefit check completed. Copay for Xarelto is $45.00. Msg sent to attending.  Isidoro Donning RN CCM Case Mgmt phone [phone]

## 2012-10-20 NOTE — Progress Notes (Signed)
[facility] Progress Note [place] TEAM 1 - Stepdown/ICU TEAM   Lisa Klein ZOX:[id number] DOB: 27-Mar-1930 DOA: 10/17/2012 PCP: [name], MD  Brief narrative: 79 year old female who presented to the [facility] ER with a complaint of shortness of breath and chest pressure. She was walking to her mailbox on the day of admission and became extremely short of breath and had to rest in her car. She was brought to the ER and workup revealed multiple pulmonary emboli-right greater than left. Further workup revealed elevated troponins. She was transferred to [facility] for further care. She follows with Dr. Mariel Sleet as an outpatient for previous DVTs and was taken off of Coumadin about a year ago.  Assessment/Plan:  Pulmonary emboli -With elevated troponin suggesting right heart strain - 2-D echo completed - RV poorly visualized but apparently size and function are normal - pulmonary systolic pressure is noted to be upper limit of normal -Lower extremity Doppler reveals left leg DVT in posterior tibial and peroneal veins -Begin Xarelto as it has been confirmed that her insurance will cover it with a $45 co-pay -Wean to room air and mobilize patient - discontinue Foley catheter - in that patient lives alone we need to confirm that she can safely function independently prior to discharge - PT/OT consulted - possible discharge in a.m.  Tricuspid Regurgitation -Previously mild - now noted to be moderate per ECHO completed yesterday - will need to follow in outpatient setting  Elevated Troponins -Likely from right heart strain associated with pulmonary embolism -No LV wall motion abnormalities noted on ECHO  HYPOTHYROIDISM -Continue Levothroid  HYPERLIPIDEMIA -Continue Zocor  OBESITY -Advised to lose weight  HYPERTENSION -BP improved -Continuing usual home medications  DEGENERATIVE JOINT DISEASE -Currently no complaints -When necessary acetaminophen if  needed  Infiltrating ductal carcinoma of breast -Status post modified radical mastectomy on 7/12 followed by chemotherapy + radiation and now on Arimidex for a total of 5 years minimum   Code Status: Full code Family Communication: Discussed directly with the patient Disposition Plan: Follow on telemetry - possible discharge in a.m. as discussed above  Consultants: None  Procedures: None  Antibiotics: None  DVT prophylaxis: Heparin infusion >> Xarelto  HPI/Subjective: The patient denies fevers chills nausea vomiting or dyspnea at rest.  She has not yet ambulated.  She does complain of some lateral left chest wall pain which is pleuritic in nature.  She denies hemoptysis melena hematemesis or hematochezia.  At the time of my evaluation she still has a Foley catheter in place and is on nasal cannula oxygen.   Objective: Blood pressure 133/78, pulse 80, temperature 98.2 F (36.8 C), temperature source Oral, resp. rate 18, height 5\' 6"  (1.676 m), weight 86.2 kg (190 lb 0.6 oz), SpO2 99.00%.  Intake/Output Summary (Last 24 hours) at 10/20/12 1456 Last data filed at 10/20/12 1300  Gross per 24 hour  Intake    480 ml  Output   3150 ml  Net  -2670 ml   Exam: General: Awake alert oriented x3, No acute respiratory distress at rest Lungs: Clear to auscultation bilaterally without wheezes or crackles Cardiovascular: Regular rate and rhythm without murmur gallop or rub  Abdomen: Nontender, nondistended, soft, bowel sounds positive, no rebound, no ascites, no appreciable mass Extremities: No significant cyanosis, clubbing - there is mild edema of the left lower extremity - no pitting noted   Data Reviewed: Basic Metabolic Panel:  Recent Labs Lab 10/17/12 1834  NA 136  K 3.5  CL 98  CO2 24  GLUCOSE 124*  BUN 12  CREATININE 0.78  CALCIUM 9.5   Liver Function Tests:  Recent Labs Lab 10/17/12 1834  AST 22  ALT 16  ALKPHOS 84  BILITOT 0.4  PROT 7.2  ALBUMIN 3.5    CBC:  Recent Labs Lab 10/17/12 1834 10/19/12 0500  WBC 5.5 4.3  NEUTROABS 3.8  --   HGB 12.7 10.2*  HCT 38.4 30.9*  MCV 88.1 88.3  PLT 131* 115*   Cardiac Enzymes:  Recent Labs Lab 10/18/12 0117  TROPONINI 1.43*     Recent Results (from the past 240 hour(s))  CULTURE, BLOOD (ROUTINE X 2)     Status: None   Collection Time    10/17/12  6:34 PM      Result Value Range Status   Specimen Description CHEST PORTA CATH RIGHT   Final   Special Requests BOTTLES DRAWN AEROBIC AND ANAEROBIC 8CC EACH   Final   Culture NO GROWTH 3 DAYS   Final   Report Status PENDING   Incomplete  URINE CULTURE     Status: None   Collection Time    10/17/12  7:10 PM      Result Value Range Status   Specimen Description URINE, CLEAN CATCH   Final   Special Requests NONE   Final   Culture  Setup Time 10/18/2012 21:32   Final   Colony Count 9,000 COLONIES/ML   Final   Culture INSIGNIFICANT GROWTH   Final   Report Status 10/20/2012 FINAL   Final  CULTURE, BLOOD (ROUTINE X 2)     Status: None   Collection Time    10/17/12  7:43 PM      Result Value Range Status   Specimen Description PORTA CATH   Final   Special Requests BOTTLES DRAWN AEROBIC ONLY 8CC   Final   Culture NO GROWTH 3 DAYS   Final   Report Status PENDING   Incomplete  MRSA PCR SCREENING     Status: None   Collection Time    10/18/12  1:20 AM      Result Value Range Status   MRSA by PCR NEGATIVE  NEGATIVE Final   Comment:            The GeneXpert MRSA Assay (FDA     approved for NASAL specimens     only), is one component of a     comprehensive MRSA colonization     surveillance program. It is not     intended to diagnose MRSA     infection nor to guide or     monitor treatment for     MRSA infections.     Studies:  Recent x-ray studies have been reviewed in detail by the Attending Physician  Scheduled Meds:  Scheduled Meds: . anastrozole  1 mg Oral QHS  . aspirin EC  81 mg Oral Daily  . calcium-vitamin D  1  tablet Oral q morning - 10a  . hydrochlorothiazide  12.5 mg Oral q morning - 10a  . levothyroxine  100 mcg Oral QAC breakfast  . potassium chloride  10 mEq Oral BID  . rivaroxaban  15 mg Oral BID WC   Followed by  . [START ON 11/11/2012] rivaroxaban  20 mg Oral Q supper  . simvastatin  10 mg Oral q1800  . sodium chloride  3 mL Intravenous Q12H   Continuous Infusions:    Time spent on care of this patient: 25 minutes  Lonia Blood  [facility] Office  [phone] Pager - Text Page per Loretha Stapler as per below:  On-Call/Text Page:      [email or web address]      password [email or web address]  If 7PM-7AM, please contact night-coverage [email or web address] Password [email or web address] 10/20/2012, 2:56 PM   LOS: 3 days

## 2012-10-20 NOTE — Care Management Note (Addendum)
    Page 1 of 2   10/22/2012     11:41:05 AM   CARE MANAGEMENT NOTE 10/22/2012  Patient:  Lisa Klein, Lisa Klein   Account Number:  [id number]  Date Initiated:  10/18/2012  Documentation initiated by:  [facility]  Subjective/Objective Assessment:   DVT     DC Planning Services  CM consult  Medication Assistance      Choice offered to / List presented to:  C-1 Patient   DME arranged  [facility]      DME agency  [facility]     HH arranged  HH-1 RN  HH-2 PT  HH-3 OT      [facility] agency  [facility]   Status of service:  Completed, signed off  Discharge Disposition:  HOME W HOME HEALTH SERVICES  Comments:  10/22/12 1021 Verdis Prime RN BSN MSN CCM Pt to d/c on home O2 with home health services.  Order and documentation faxed to [facility].  Provided card for 10-day free supply of Xarelto, checked with [place] - they have 15 mg dosage in stock, attending will write Rx.  10/21/12 1250 [facility] RN BSN MSN CCM PT/OT recommend home health therapies, pt will also need home health RN.  Provided list of agencies, referral made per pt choice.  RN to ambulate pt to determine O2 sats off O2. 1414 O2 sats 88%, MD wants test repeated in one hour. 1530 Discharge cancelled 2/2 low O2 sats with activity.  10/20/12 1003 Efe Fazzino RN BSN MSN CCM Talked with pt who agrees with $45 copay for Xarelto.  10/20/2012 0920 Benefit check completed. Copay for Xarelto is $45.00. Msg sent to attending.  Isidoro Donning RN CCM Case Mgmt phone [phone]  10/18/2012 1600 NCM spoke to pt and states she uses [facility] mail order pharmacy. She uses [facility] in [place] sometimes for medications. Contacted [employer] to verify copay for Xarelto. Customer Services is unavailable due to training. Will follow up on 3/16. Pt will receive 10 day free trial of Xarelto. NCM notified attending. Isidoro Donning RN CCM Case Mgmt phone [phone]  Xarelto 15 mg bid for 21  days Xarelto 20 mg qd

## 2012-10-21 DIAGNOSIS — N390 Urinary tract infection, site not specified: Secondary | ICD-10-CM

## 2012-10-21 LAB — BASIC METABOLIC PANEL
BUN: 10 mg/dL (ref 6–23)
CO2: 25 mEq/L (ref 19–32)
Chloride: 103 mEq/L (ref 96–112)
Creatinine, Ser: 0.82 mg/dL (ref 0.50–1.10)
GFR calc Af Amer: 76 mL/min — ABNORMAL LOW (ref >90)

## 2012-10-21 LAB — CBC
HCT: 33.9 % — ABNORMAL LOW (ref 36.0–46.0)
MCH: 28.9 pg (ref 26.0–34.0)
MCV: 86.7 fL (ref 78.0–100.0)
Platelets: 160 10*3/uL (ref 150–400)
RBC: 3.91 MIL/uL (ref 3.87–5.11)
RDW: 14.2 % (ref 11.5–15.5)
WBC: 4.7 10*3/uL (ref 4.0–10.5)

## 2012-10-21 MED ORDER — RIVAROXABAN 20 MG PO TABS: 20.0000 mg | ORAL_TABLET | Freq: Every day | ORAL | Status: DC

## 2012-10-21 MED ORDER — SODIUM CHLORIDE 0.9 % IJ SOLN
10.0000 mL | INTRAMUSCULAR | Status: DC | PRN
  Administered 2012-10-21 – 2012-10-22 (×3): 10 mL

## 2012-10-21 MED ORDER — SODIUM CHLORIDE 0.9 % IJ SOLN
10.0000 mL | Freq: Two times a day (BID) | INTRAMUSCULAR | Status: DC
  Administered 2012-10-22: 10 mL

## 2012-10-21 MED ORDER — RIVAROXABAN 15 MG PO TABS: 15.0000 mg | ORAL_TABLET | Freq: Two times a day (BID) | ORAL | Status: DC

## 2012-10-21 NOTE — Evaluation (Signed)
Occupational Therapy Evaluation Patient Details Name: Lisa Klein MRN: [id number] DOB: 1929/10/15 Today's Date: 10/21/2012 Time: 7829-5621 OT Time Calculation (min): 21 min  OT Assessment / Plan / Recommendation Clinical Impression  Pt admitted with shortness of breath and found to have PE.  Plans to d/c home this afternoon with intermittent assist of her daughter.  Pt continues to have decreased activity tolerance and SOB with exertion with desats to 88% with toileting and grooming activities on RA.  Instructed in energy conservation, pacing, and pursed lip breathing.  Recommended pt consider tub equipment and Lif[employer] or cell phone for safety as she lives alone.     OT Assessment  All further OT needs can be met in the next venue of care    Follow Up Recommendations  Home health OT;Supervision - Intermittent    Barriers to Discharge      Equipment Recommendations       Recommendations for Other Services    Frequency       Precautions / Restrictions Precautions Precautions: None Restrictions Weight Bearing Restrictions: No   Pertinent Vitals/Pain No pain    ADL  Eating/Feeding: Independent Grooming: Wash/dry hands;Independent Where Assessed - Grooming: Unsupported standing Upper Body Bathing: Set up Where Assessed - Upper Body Bathing: Unsupported sitting Lower Body Bathing: Set up Where Assessed - Lower Body Bathing: Unsupported sitting;Supported sit to stand Upper Body Dressing: Set up Where Assessed - Upper Body Dressing: Unsupported sitting Lower Body Dressing: Set up Where Assessed - Lower Body Dressing: Unsupported sitting Toilet Transfer: Modified independent Toilet Transfer Method: Sit to stand Toilet Transfer Equipment: Regular height toilet Toileting - Clothing Manipulation and Hygiene: Independent Where Assessed - Toileting Clothing Manipulation and Hygiene: Sit to stand from 3-in-1 or toilet Transfers/Ambulation Related to ADLs: ambulated in room  only, holding sink and furniture.   ADL Comments: Instructed pt in energy conservation, purse lip breathing and pacing during ADL.   Recommended tub seat for home use in bathroom with grab bars.    OT Diagnosis: Generalized weakness  OT Problem List: Decreased activity tolerance;Decreased knowledge of use of DME or AE;Cardiopulmonary status limiting activity;Obesity OT Treatment Interventions:     OT Goals    Visit Information  Last OT Received On: 10/21/12 Assistance Needed: +1    Subjective Data  Subjective: "I think I will be alright once I can catch my breath." Patient Stated Goal: Home with intermittent assist of daughter.   Prior Functioning     Home Living Lives With: Alone Available Help at Discharge: Family;Available PRN/intermittently Type of Home: House Home Access: Stairs to enter Entrance Stairs-Number of Steps: 5 Entrance Stairs-Rails: Right Home Layout: One level (one step down to some living areas) Bathroom Shower/Tub: Engineer, manufacturing systems: Standard Home Adaptive Equipment: Straight cane;Grab bars in shower Prior Function Level of Independence: Independent Able to Take Stairs?: Yes Driving: Yes Vocation: Retired Comments: Pt was having increasing difficulty managing her housework.  Drove when she felt like it. Communication Communication: No difficulties Dominant Hand: Right         Vision/Perception Vision - History Patient Visual Report: No change from baseline   Cognition  Cognition Overall Cognitive Status: Appears within functional limits for tasks assessed/performed Arousal/Alertness: Awake/alert Orientation Level: Appears intact for tasks assessed Behavior During Session: [facility] for tasks performed    Extremity/Trunk Assessment Right Upper Extremity Assessment RUE ROM/Strength/Tone: [facility] for tasks assessed RUE Coordination: WFL - gross/fine motor Left Upper Extremity Assessment LUE ROM/Strength/Tone: WFL for tasks  assessed LUE Coordination:  WFL - gross/fine motor   Mobility Bed Mobility Bed Mobility: Not assessed Transfers Transfers: Sit to Stand;Stand to Sit Sit to Stand: 7: Independent;With upper extremity assist;From chair/3-in-1;From toilet Stand to Sit: 7: Independent;To bed;To chair/3-in-1     Exercise     Balance Dynamic Standing Balance Dynamic Standing - Balance Support: Bilateral upper extremity supported;During functional activity Dynamic Standing - Level of Assistance: 6: Modified independent (Device/Increase time) Dynamic Standing - Balance Activities: Lateral lean/weight shifting;Forward lean/weight shifting   End of Session OT - End of Session Activity Tolerance: Patient limited by fatigue Patient left: in chair;with call bell/phone within reach Nurse Communication:  (HHOT recommended)  GO     Evern Bio 10/21/2012, 12:20 PM [phone]

## 2012-10-21 NOTE — Progress Notes (Signed)
{  PT ALLSATURATION QUALIFICATIONS: (This note is used to comply with regulatory documentation for home oxygen)  Patient Saturations on Room Air at Rest = 96%  Patient Saturations on Room Air while Ambulating = 87-94%  Patient Saturations on 2 Liters of oxygen while Ambulating = 92-95%  Please briefly explain why patient needs home oxygen:  Pt desats with ambulation without O2 unless constantly cued for pursed lip breathing.  Thanks. [facility] Acute Rehabilitation [phone] (pager)

## 2012-10-21 NOTE — Progress Notes (Signed)
Ambulated pt in hallway room sat , at rest sat 95%, during ambulation desat to 87-88%, reapplied 2 liters sat up to 94%. Pt assisted to chair  Complaint being tired, Dr. Butler Denmark notified, dc for today cancelled discussed with pt and daughter at bedside. Will continue to follow closely Lisa Klein

## 2012-10-21 NOTE — Progress Notes (Signed)
10/21/2012 2:03 PM Nursing note  Pt. Ambulated 150 ft with RN on RA. At rest o2 saturation was 94%.  At end of walk pt. o2 saturation dropped to 88% on RA. 2L o2 [place] applied, o2 saturation raised to 92%. Dr. Butler Denmark paged and made aware Orders received. Will continue to closely monitor patient.  Lisa Klein, Blanchard Kelch

## 2012-10-21 NOTE — Evaluation (Addendum)
Physical Therapy Evaluation Patient Details Name: Lisa Klein MRN: [id number] DOB: [birth year] Today's Date: 10/21/2012 Time: 1308-6578 PT Time Calculation (min): 27 min  PT Assessment / Plan / Recommendation Clinical Impression  Pt s/p PE with decr mobility secondary to decr endurance for activity with continued varying SOB with activity.  Desat to 87% on RA with ambulation.  Improves with pursed lip breathing.  Obtained incentive spirometer as well and reviewed use with pt.  to help improve breathing techniques.  May benefit from HHPT safety eval and RW.      PT Assessment  Patient needs continued PT services    Follow Up Recommendations  Home health PT       Barriers to Discharge Decreased caregiver support      Equipment Recommendations  Rolling walker with 5" wheels ; ?O2      Frequency Min 3X/week    Precautions / Restrictions Precautions Precautions: None Restrictions Weight Bearing Restrictions: No   Pertinent Vitals/Pain See O2 sat note for sats, No pain      Mobility  Bed Mobility Bed Mobility: Not assessed Transfers Transfers: Sit to Stand;Stand to Sit Sit to Stand: 7: Independent Stand to Sit: 7: Independent Ambulation/Gait Ambulation/Gait Assistance: 6: Modified independent (Device/Increase time) Ambulation Distance (Feet): 250 Feet Assistive device: Rolling walker Gait Pattern: Within Functional Limits Gait velocity: decreased Stairs: No Wheelchair Mobility Wheelchair Mobility: No         PT Diagnosis: Generalized weakness  PT Problem List: Decreased activity tolerance;Decreased balance;Decreased mobility;Decreased knowledge of use of DME;Decreased safety awareness;Decreased knowledge of precautions PT Treatment Interventions: DME instruction;Gait training;Functional mobility training;Therapeutic activities;Therapeutic exercise;Balance training;Patient/family education   PT Goals Acute Rehab PT Goals PT Goal Formulation: With  patient Time For Goal Achievement: 10/28/12 Potential to Achieve Goals: Good Pt will Ambulate: with modified independence;with least restrictive assistive device;>150 feet PT Goal: Ambulate - Progress: Goal set today Additional Goals Additional Goal #1: Pt to keep sats >90% on RA with ambulation. PT Goal: Additional Goal #1 - Progress: Goal set today  Visit Information  Last PT Received On: 10/21/12 Assistance Needed: +1    Subjective Data  Subjective: "I am going home today." Patient Stated Goal: To go home   Prior Functioning  Home Living Lives With: Alone Type of Home: House Home Layout: One level Bathroom Toilet: Standard Home Adaptive Equipment: None Prior Function Level of Independence: Independent Able to Take Stairs?: Yes Driving: Yes Vocation: Retired Musician: No difficulties    Copywriter, advertising Overall Cognitive Status: Appears within functional limits for tasks assessed/performed Arousal/Alertness: Awake/alert Orientation Level: Appears intact for tasks assessed Behavior During Session: [facility] for tasks performed    Extremity/Trunk Assessment Right Lower Extremity Assessment RLE ROM/Strength/Tone: [facility] for tasks assessed Left Lower Extremity Assessment LLE ROM/Strength/Tone: [facility] for tasks assessed   Balance Dynamic Standing Balance Dynamic Standing - Balance Support: Bilateral upper extremity supported;During functional activity Dynamic Standing - Level of Assistance: 6: Modified independent (Device/Increase time) Dynamic Standing - Balance Activities: Lateral lean/weight shifting;Forward lean/weight shifting  End of Session PT - End of Session Equipment Utilized During Treatment: Gait belt;Oxygen Activity Tolerance: Patient tolerated treatment well Patient left: in chair;with call bell/phone within reach Nurse Communication: Mobility status       INGOLD,Brayleigh Rybacki 10/21/2012, 12:00 PM [facility] Acute  Rehabilitation [phone] (pager)

## 2012-10-21 NOTE — Discharge Summary (Signed)
Physician Discharge Summary  Lisa Klein ZOX:[id number] DOB: 17-Jul-1930 DOA: 10/17/2012  PCP: [name], MD  Admit date: 10/17/2012 Discharge date: 10/21/2012  Time spent: > 45 minutes  Recommendations for Outpatient Follow-up:  outpt f/u with Dr Mariel Sleet in 2-3 wks.    Discharge Diagnoses:  Principal Problem:   Pulmonary embolism Active Problems:   HYPOTHYROIDISM   HYPERLIPIDEMIA   OBESITY   HYPERTENSION   DEGENERATIVE JOINT DISEASE   Infiltrating ductal carcinoma of breast   DVT - previously in right leg and currently in left leg   Discharge Condition: stable  Diet recommendation: heart healthy  Filed Weights   10/18/12 1601 10/19/12 0500 10/19/12 1211  Weight: 85.322 kg (188 lb 1.6 oz) 85 kg (187 lb 6.3 oz) 86.2 kg (190 lb 0.6 oz)    History of present illness:  79 year old female who presented to the [facility] ER with a complaint of shortness of breath and chest pressure. She was walking to her mailbox on the day of admission and became extremely short of breath and had to rest in her car. She was brought to the ER and workup revealed multiple pulmonary emboli-right greater than left. Further workup revealed elevated troponins. She was transferred to [facility] for further care.  She follows with Dr. Mariel Sleet as an outpatient for previous DVTs and was taken off of Coumadin about a year ago.   Hospital Course:  Pulmonary emboli  -With elevated troponin suggesting right heart strain  - 2-D echo completed - RV poorly visualized but apparently size and function are normal - pulmonary systolic pressure is noted to be upper limit of normal  -Lower extremity Doppler reveals left leg DVT in posterior tibial and peroneal veins  - Xarelto has been initiated- it has been confirmed that her insurance will cover it with a $45 co-pay  -Weaned to room air  - PT/OT consulted  Tricuspid Regurgitation  -Previously mild - now noted to be moderate per ECHO  completed yesterday - will need to follow in outpatient setting   Elevated Troponins  -Likely from right heart strain associated with pulmonary embolism  -No LV wall motion abnormalities noted on ECHO   HYPOTHYROIDISM  -Continue Levothroid   HYPERLIPIDEMIA  -Continue Zocor   OBESITY  -Advised to lose weight   HYPERTENSION  -BP improved  -Continuing usual home medications   DEGENERATIVE JOINT DISEASE  -Currently no complaints  -When necessary acetaminophen if needed   Infiltrating ductal carcinoma of breast  -Status post modified radical mastectomy on 7/12 followed by chemotherapy + radiation and now on Arimidex for a total of 5 years minimum   Discharge Exam: Filed Vitals:   10/20/12 1413 10/20/12 2017 10/21/12 0458 10/21/12 0922  BP: 133/78 141/77 132/78 115/63  Pulse: 80 107 92 93  Temp: 98.2 F (36.8 C) 98.4 F (36.9 C) 98.2 F (36.8 C) 97.9 F (36.6 C)  TempSrc: Oral Oral Oral Oral  Resp: 18 20 19    Height:      Weight:      SpO2: 99% 91% 96%     General: Alert, oriented x 3, no distres Cardiovascular: RRR, no murmurs Respiratory: CTA b/l   Discharge Instructions      Discharge Orders   Future Appointments Provider Department Dept Phone   11/12/2012 11:00 AM [facility] Chair [facility] [phone]   12/04/2012 9:30 AM Kerri Perches, MD [facility] [phone]   03/23/2013 10:40 AM [facility] [phone]  03/25/2013 11:30 AM Ellouise Newer, PA-C [facility] [phone]   Future Orders Complete By Expires     Diet - low sodium heart healthy  As directed     Increase activity slowly  As directed         Medication List    TAKE these medications       acetaminophen 650 MG CR tablet  Commonly known as:  TYLENOL  Take 650 mg by mouth daily as needed for pain.     anastrozole 1 MG tablet  Commonly known as:  ARIMIDEX  Take 1 mg by mouth at bedtime.     docusate sodium 100 MG  capsule  Commonly known as:  COLACE  Take 100 mg by mouth daily as needed for constipation. For constipation     hydrochlorothiazide 12.5 MG capsule  Commonly known as:  MICROZIDE  Take 12.5 mg by mouth every morning.     levothyroxine 100 MCG tablet  Commonly known as:  SYNTHROID, LEVOTHROID  Take 100 mcg by mouth every morning.     lovastatin 20 MG tablet  Commonly known as:  MEVACOR  Take 1 tablet (20 mg total) by mouth at bedtime.     OSCAL 500/200 D-3 500-200 MG-UNIT per tablet  Generic drug:  calcium-vitamin D  Take 1 tablet by mouth every morning.     potassium chloride 10 MEQ tablet  Commonly known as:  K-DUR,KLOR-CON  Take 1 tablet (10 mEq total) by mouth 2 (two) times daily.     Rivaroxaban 15 MG Tabs tablet  Commonly known as:  XARELTO  Take 1 tablet (15 mg total) by mouth 2 (two) times daily with a meal.     Rivaroxaban 20 MG Tabs  Commonly known as:  XARELTO  Take 1 tablet (20 mg total) by mouth daily with supper.  Start taking on:  11/11/2012     Vitamin D3 1000 UNITS Caps  Take 1,000 Units by mouth every morning.          The results of significant diagnostics from this hospitalization (including imaging, microbiology, ancillary and laboratory) are listed below for reference.    Significant Diagnostic Studies: Ct Angio Chest Pe W/cm &/or Wo Cm  10/17/2012  *RADIOLOGY REPORT*  Clinical Data: Acute shortness breath.  Deep venous thrombosis.Breast carcinoma.  CT ANGIOGRAPHY CHEST  Technique:  Multidetector CT imaging of the chest using the standard protocol during bolus administration of intravenous contrast. Multiplanar reconstructed images including MIPs were obtained and reviewed to evaluate the vascular anatomy.  Contrast: OMNIPAQUE IOHEXOL 350 MG/ML SOLN  Comparison: PET CT on 04/05/2011  Findings: Enlarged pulmonary embolism is seen nearly occluding the right pulmonary artery, with mild embolism is seen in the left upper and lower lobe pulmonary  arteries.  No evidence of thoracic aortic aneurysm or dissection.  No evidence of hilar or mediastinal masses.  No lymphadenopathy identified within the thorax.  Postradiation changes are seen in the anterior left upper lobe.  No evidence of acute infiltrate or central endobronchial lesion.  No suspicious pulmonary nodules or masses are identified.  No evidence of pleural or pericardial effusion.  No evidence of axillary lymphadenopathy or chest wall mass.  Previous left mastectomy noted.  No suspicious bone lesions identified.  IMPRESSION:  1.  Positive for bilateral pulmonary embolism, right side greater than left. 2.  Left lung post radiation changes. 3.  No evidence of recurrent or metastatic carcinoma.  Critical Value/emergent results were called by telephone at  the time of interpretation on 10/17/2012 at 2000 hours to Dr. Fonnie Jarvis in the emergency department, who verbally acknowledged these results.   Original Report Authenticated By: Myles Rosenthal, M.D.    Dg Chest Port 1 View  10/17/2012  *RADIOLOGY REPORT*  Clinical Data: Shortness of breath.  Weakness.  History of left breast cancer.  PORTABLE CHEST - 1 VIEW  Comparison: 04/16/2011  Findings: The patient has a right-sided Port-A-Cath, tip to the level of the superior vena cava.  Surgical clips overlie the left axilla.  Heart size is upper limits normal.  There are no focal consolidations or pleural effusions.  No pulmonary edema.  IMPRESSION:  1. No evidence for acute cardiopulmonary abnormality.  2.  Right-sided Port-A-Cath.   Original Report Authenticated By: Norva Pavlov, M.D.     Microbiology: Recent Results (from the past 240 hour(s))  CULTURE, BLOOD (ROUTINE X 2)     Status: None   Collection Time    10/17/12  6:34 PM      Result Value Range Status   Specimen Description CHEST PORTA CATH RIGHT   Final   Special Requests BOTTLES DRAWN AEROBIC AND ANAEROBIC 8CC EACH   Final   Culture NO GROWTH 3 DAYS   Final   Report Status PENDING    Incomplete  URINE CULTURE     Status: None   Collection Time    10/17/12  7:10 PM      Result Value Range Status   Specimen Description URINE, CLEAN CATCH   Final   Special Requests NONE   Final   Culture  Setup Time 10/18/2012 21:32   Final   Colony Count 9,000 COLONIES/ML   Final   Culture INSIGNIFICANT GROWTH   Final   Report Status 10/20/2012 FINAL   Final  CULTURE, BLOOD (ROUTINE X 2)     Status: None   Collection Time    10/17/12  7:43 PM      Result Value Range Status   Specimen Description PORTA CATH   Final   Special Requests BOTTLES DRAWN AEROBIC ONLY 8CC   Final   Culture NO GROWTH 3 DAYS   Final   Report Status PENDING   Incomplete  MRSA PCR SCREENING     Status: None   Collection Time    10/18/12  1:20 AM      Result Value Range Status   MRSA by PCR NEGATIVE  NEGATIVE Final   Comment:            The GeneXpert MRSA Assay (FDA     approved for NASAL specimens     only), is one component of a     comprehensive MRSA colonization     surveillance program. It is not     intended to diagnose MRSA     infection nor to guide or     monitor treatment for     MRSA infections.     Labs: Basic Metabolic Panel:  Recent Labs Lab 10/17/12 1834 10/21/12 0643  NA 136 137  K 3.5 3.8  CL 98 103  CO2 24 25  GLUCOSE 124* 95  BUN 12 10  CREATININE 0.78 0.82  CALCIUM 9.5 9.1   Liver Function Tests:  Recent Labs Lab 10/17/12 1834  AST 22  ALT 16  ALKPHOS 84  BILITOT 0.4  PROT 7.2  ALBUMIN 3.5   No results found for this basename: LIPASE, AMYLASE,  in the last 168 hours No results found for this basename: AMMONIA,  in the last 168 hours CBC:  Recent Labs Lab 10/17/12 1834 10/19/12 0500 10/21/12 0643  WBC 5.5 4.3 4.7  NEUTROABS 3.8  --   --   HGB 12.7 10.2* 11.3*  HCT 38.4 30.9* 33.9*  MCV 88.1 88.3 86.7  PLT 131* 115* 160   Cardiac Enzymes:  Recent Labs Lab 10/18/12 0117  TROPONINI 1.43*   BNP: BNP (last 3 results) No results found for this  basename: PROBNP,  in the last 8760 hours CBG: No results found for this basename: GLUCAP,  in the last 168 hours     Signed:  Hawkins Seaman  [facility] 10/21/2012, 11:44 AM

## 2012-10-22 LAB — CULTURE, BLOOD (ROUTINE X 2): Culture: NO GROWTH

## 2012-10-22 LAB — CBC
HCT: 32.9 % — ABNORMAL LOW (ref 36.0–46.0)
MCHC: 34 g/dL (ref 30.0–36.0)
MCV: 85 fL (ref 78.0–100.0)
RDW: 14.4 % (ref 11.5–15.5)

## 2012-10-22 MED ORDER — HEPARIN SOD (PORK) LOCK FLUSH 100 UNIT/ML IV SOLN
500.0000 [IU] | INTRAVENOUS | Status: AC | PRN
  Administered 2012-10-22: 500 [IU]

## 2012-10-22 MED ORDER — RIVAROXABAN 10 MG PO TABS: 15.0000 mg | ORAL_TABLET | Freq: Two times a day (BID) | ORAL | Status: DC

## 2012-10-22 MED ORDER — RIVAROXABAN 15 MG PO TABS: 15.0000 mg | ORAL_TABLET | Freq: Two times a day (BID) | ORAL | Status: DC

## 2012-10-22 NOTE — Plan of Care (Signed)
Patient seen and examined today. Agree with Dr. Butler Denmark plan, will d/c home today, on oxygen.

## 2012-10-22 NOTE — Progress Notes (Signed)
Discharge Note: Pt is alert and oriented, VS are stable, denies CP.  Telemetry & IV discontinued.  IV team contacted to deaccess PAC to R chest. Discharge instructions reviewed with patient, pt verbalizes understanding.  Wheelchair transportation provided, all belongings with patient. Home O2 arranged for patient.

## 2012-10-30 DIAGNOSIS — I2699 Other pulmonary embolism without acute cor pulmonale: Secondary | ICD-10-CM

## 2012-10-30 DIAGNOSIS — Z9981 Dependence on supplemental oxygen: Secondary | ICD-10-CM

## 2012-10-30 DIAGNOSIS — I10 Essential (primary) hypertension: Secondary | ICD-10-CM

## 2012-10-30 DIAGNOSIS — E669 Obesity, unspecified: Secondary | ICD-10-CM

## 2012-10-30 DIAGNOSIS — M159 Polyosteoarthritis, unspecified: Secondary | ICD-10-CM

## 2012-10-31 ENCOUNTER — Encounter: Payer: Self-pay | Admitting: Family Medicine

## 2012-10-31 ENCOUNTER — Ambulatory Visit (INDEPENDENT_AMBULATORY_CARE_PROVIDER_SITE_OTHER): Payer: Medicare HMO | Admitting: Family Medicine

## 2012-10-31 VITALS — BP 118/70 | HR 82 | Resp 16 | Ht 66.0 in | Wt 183.0 lb

## 2012-10-31 DIAGNOSIS — R5381 Other malaise: Secondary | ICD-10-CM

## 2012-10-31 DIAGNOSIS — E785 Hyperlipidemia, unspecified: Secondary | ICD-10-CM

## 2012-10-31 DIAGNOSIS — I2699 Other pulmonary embolism without acute cor pulmonale: Secondary | ICD-10-CM

## 2012-10-31 DIAGNOSIS — I10 Essential (primary) hypertension: Secondary | ICD-10-CM

## 2012-10-31 DIAGNOSIS — M1990 Unspecified osteoarthritis, unspecified site: Secondary | ICD-10-CM

## 2012-10-31 DIAGNOSIS — E039 Hypothyroidism, unspecified: Secondary | ICD-10-CM

## 2012-10-31 MED ORDER — LOVASTATIN 20 MG PO TABS: 20.0000 mg | ORAL_TABLET | Freq: Every day | ORAL | Status: DC

## 2012-10-31 MED ORDER — HYDROCHLOROTHIAZIDE 12.5 MG PO CAPS: 12.5000 mg | ORAL_CAPSULE | Freq: Every morning | ORAL | Status: DC

## 2012-10-31 MED ORDER — RIVAROXABAN 20 MG PO TABS: 20.0000 mg | ORAL_TABLET | Freq: Every day | ORAL | Status: DC

## 2012-10-31 MED ORDER — RIVAROXABAN 15 MG PO TABS: ORAL_TABLET | ORAL | Status: DC

## 2012-10-31 MED ORDER — LEVOTHYROXINE SODIUM 100 MCG PO TABS: 100.0000 ug | ORAL_TABLET | Freq: Every morning | ORAL | Status: DC

## 2012-10-31 MED ORDER — POTASSIUM CHLORIDE CRYS ER 10 MEQ PO TBCR: 10.0000 meq | EXTENDED_RELEASE_TABLET | Freq: Two times a day (BID) | ORAL | Status: DC

## 2012-10-31 NOTE — Patient Instructions (Addendum)
F/u in 3 month, call if you need me before   Take xarelto the blood thinner, 15 mg twice daily until April 7.  Starting April 8, new dose, which I have ordered through the mail is xarelto 20mg  oNE daily  No other med changes  TSH, chem 7, and cBc in 3 month before next visit please

## 2012-11-01 NOTE — Assessment & Plan Note (Signed)
Controlled when last chacked. Updated lab next visit

## 2012-11-01 NOTE — Progress Notes (Signed)
  Subjective:    Patient ID: Lisa Klein, female    DOB: [birth year], 79 y.o.   MRN: [id number]  HPI Pt in for f/u for recent hospitalization for bilateral PE, from  3/14 to 3/18. Still regaining strength, however, lives independently, but does have god family support. Does have exertional fatigue, denies fever or cough or hemoptysis.  No hematuria or rectal bleeding    Review of Systems See HPI Denies recent fever or chills. Denies sinus pressure, nasal congestion, ear pain or sore throat. Denies chest congestion, productive cough or wheezing. Denies chest pains, palpitations and leg swelling Denies abdominal pain, nausea, vomiting,diarrhea or constipation.   Denies dysuria, frequency, hesitancy or incontinence. Chronic arthritis affecting spine and lower extremities. Denies headaches, seizures, numbness, or tingling. Denies depression, has mild anxiety associated with acute ilness denies  insomnia. Denies skin break down or rash.        Objective:   Physical Exam Patient alert and oriented and in no cardiopulmonary distress.  HEENT: No facial asymmetry, EOMI, no sinus tenderness,  oropharynx pink and moist.  Neck decreased though adequate ROM,no adenopathy.  Chest: Clear to auscultation bilaterally.  CVS: S1, S2 no murmurs, no S3.  ABD: Soft non tender. Bowel sounds normal.  Ext: No edema  MS: decreased  ROM spine, shoulders, hips and knees.  Skin: Intact, no ulcerations or rash noted.  Psych: Good eye contact, normal affect. Memory intact not anxious or depressed appearing.  CNS: CN 2-12 intact, power, tone and sensation normal throughout.        Assessment & Plan:

## 2012-11-01 NOTE — Assessment & Plan Note (Signed)
No report of over bleeding on xaralto.  Gradual increase in exercise tolerance being experienced , as is expected

## 2012-11-01 NOTE — Assessment & Plan Note (Signed)
No acute flare of pain and debility at this time

## 2012-11-01 NOTE — Assessment & Plan Note (Signed)
Controlled, no change in medication DASH diet and commitment to daily physical activity for a minimum of 30 minutes discussed and encouraged, as a part of hypertension management. The importance of attaining a healthy weight is also discussed.  

## 2012-11-12 ENCOUNTER — Other Ambulatory Visit (HOSPITAL_COMMUNITY): Payer: Medicare HMO

## 2012-11-17 ENCOUNTER — Encounter (HOSPITAL_COMMUNITY): Payer: Medicare HMO | Attending: Oncology | Admitting: Oncology

## 2012-11-17 ENCOUNTER — Encounter (HOSPITAL_COMMUNITY): Payer: Medicare HMO

## 2012-11-17 ENCOUNTER — Other Ambulatory Visit (HOSPITAL_COMMUNITY): Payer: Self-pay | Admitting: Oncology

## 2012-11-17 VITALS — BP 138/70 | HR 115 | Temp 97.9°F | Resp 20 | Wt 188.0 lb

## 2012-11-17 DIAGNOSIS — C50912 Malignant neoplasm of unspecified site of left female breast: Secondary | ICD-10-CM

## 2012-11-17 DIAGNOSIS — C50919 Malignant neoplasm of unspecified site of unspecified female breast: Secondary | ICD-10-CM | POA: Insufficient documentation

## 2012-11-17 DIAGNOSIS — I82402 Acute embolism and thrombosis of unspecified deep veins of left lower extremity: Secondary | ICD-10-CM

## 2012-11-17 DIAGNOSIS — C50419 Malignant neoplasm of upper-outer quadrant of unspecified female breast: Secondary | ICD-10-CM

## 2012-11-17 DIAGNOSIS — I82409 Acute embolism and thrombosis of unspecified deep veins of unspecified lower extremity: Secondary | ICD-10-CM

## 2012-11-17 DIAGNOSIS — I2699 Other pulmonary embolism without acute cor pulmonale: Secondary | ICD-10-CM

## 2012-11-17 LAB — CBC WITH DIFFERENTIAL/PLATELET
Basophils Absolute: 0 10*3/uL (ref 0.0–0.1)
Eosinophils Absolute: 0.1 10*3/uL (ref 0.0–0.7)
Eosinophils Relative: 2 % (ref 0–5)
Lymphocytes Relative: 36 % (ref 12–46)
Lymphs Abs: 1.2 10*3/uL (ref 0.7–4.0)
MCH: 29 pg (ref 26.0–34.0)
MCV: 88.5 fL (ref 78.0–100.0)
Neutrophils Relative %: 51 % (ref 43–77)
Platelets: 173 10*3/uL (ref 150–400)
RBC: 4 MIL/uL (ref 3.87–5.11)
RDW: 14.7 % (ref 11.5–15.5)
WBC: 3.3 10*3/uL — ABNORMAL LOW (ref 4.0–10.5)

## 2012-11-17 LAB — COMPREHENSIVE METABOLIC PANEL
ALT: 13 U/L (ref 0–35)
AST: 21 U/L (ref 0–37)
Albumin: 3.8 g/dL (ref 3.5–5.2)
Alkaline Phosphatase: 65 U/L (ref 39–117)
Calcium: 9.6 mg/dL (ref 8.4–10.5)
Glucose, Bld: 86 mg/dL (ref 70–99)
Potassium: 3.7 mEq/L (ref 3.5–5.1)
Sodium: 141 mEq/L (ref 135–145)
Total Protein: 7.1 g/dL (ref 6.0–8.3)

## 2012-11-17 MED ORDER — SODIUM CHLORIDE 0.9 % IJ SOLN
10.0000 mL | INTRAMUSCULAR | Status: AC | PRN
  Filled 2012-11-17: qty 10

## 2012-11-17 MED ORDER — HEPARIN SOD (PORK) LOCK FLUSH 100 UNIT/ML IV SOLN
500.0000 [IU] | Freq: Once | INTRAVENOUS | Status: AC
  Filled 2012-11-17: qty 5

## 2012-11-17 MED ORDER — HEPARIN SOD (PORK) LOCK FLUSH 100 UNIT/ML IV SOLN
INTRAVENOUS | Status: AC
  Filled 2012-11-17: qty 5

## 2012-11-17 MED ORDER — ANASTROZOLE 1 MG PO TABS: 1.0000 mg | ORAL_TABLET | Freq: Every day | ORAL | Status: DC

## 2012-11-17 NOTE — Progress Notes (Signed)
Lisa Klein presented for Portacath access and flush. Proper placement of portacath confirmed by CXR. Portacath located rt chest wall accessed with  H 20 needle. Sluggish blood return. Portacath flushed with 20ml NS and 500U/11ml Heparin and needle removed intact. Procedure without incident. Patient tolerated procedure well.  Lisa Klein presented for labwork. Labs per MD order drawn via Peripheral Line 23 gauge needle inserted in rt ac Good blood return present. Procedure without incident.  Needle removed intact. Patient tolerated procedure well.

## 2012-11-17 NOTE — Progress Notes (Signed)
#  1 new onset bilateral pulmonary emboli with left leg DVT now on xarelto. Her risk factors include history of breast cancer, sedentary existence, and the use of Arimidex #2 history of right leg DVT October 2012 treated with 6 months of Coumadin. Prior to that she had of family history or personal history of this. #3 stage III A. adenocarcinoma the left breast status post modified radical mastectomy on 02/14/2011 followed by 6 cycles of epirubicin and Cytoxan with significant dose reductions. She then had radiation therapy. She is now on Arimidex. #4 severe degenerative joint disease of the knees, right greater than left.  She is here today with one of her daughters. She was admitted recently [place] with shortness of breath. We had actually seen her the end of February for routine visit but 2 days before she come in with mild chest discomfort that was nonspecific and not accompanied by leg swelling or shortness of breath and her EKG was unremarkable and she had blamed this on recent onions and garlic ingestion. We have not found anything acutely. She states she then developed shortness of breath or leg swelling several days to week later and came to the emergency room.  She is doing well presently with no shortness of breath no chest pain etc. she has no fevers chills or night sweats. She is not aware of any lumps or bumps anywhere.  Her vital signs are stable. Lymph nodes are negative throughout. The left chest wall is clear. The right breast is negative. Her lungs do not reveal rub or rales. Heart shows a regular rhythm and rate without obvious murmur rub or gallop. Abdomen is soft and nontender without hepatosplenomegaly. She has no arm edema but she does have trace edema of the left leg.  She is alert and oriented. His severe crepitations of both knees though right is greater than left.  I think as long she is on the Arimidex you'll need to be on xarelto. I do think we need to rule out  recurrent cancer with a PET scan.  I do not think cancer marker would be adequate by itself.  She knows to avoid anti-inflammatories if possible.  We will see her in 8 weeks to make sure she is doing well but we will set up a PET scan.

## 2012-11-17 NOTE — Patient Instructions (Addendum)
[facility] Discharge Instructions  RECOMMENDATIONS MADE BY THE CONSULTANT AND ANY TEST RESULTS WILL BE SENT TO YOUR REFERRING PHYSICIAN.  EXAM FINDINGS BY THE PHYSICIAN TODAY AND SIGNS OR SYMPTOMS TO REPORT TO CLINIC OR PRIMARY PHYSICIAN: exam and discussion by MD.  We will keep you on xarelto while you are on the arimidex.    MEDICATIONS PRESCRIBED:  none  INSTRUCTIONS GIVEN AND DISCUSSED: Report unusual bruising or bleeding, any new lumps, bone pain or shortness of breath. For PET scan, nothing to eat or drink 6 hours prior to the scan.  No chewing gum, mints, hard candy, etc.  Nothing with sugar in it.  Nothing after 6 am but water.  SPECIAL INSTRUCTIONS/FOLLOW-UP: Return in 8 weeks to see MD in follow-up.   Thank you for choosing [facility] to provide your oncology and hematology care.  To afford each patient quality time with our providers, please arrive at least 15 minutes before your scheduled appointment time.  With your help, our goal is to use those 15 minutes to complete the necessary work-up to ensure our physicians have the information they need to help with your evaluation and healthcare recommendations.    Effective January 1st, 2014, we ask that you re-schedule your appointment with our physicians should you arrive 10 or more minutes late for your appointment.  We strive to give you quality time with our providers, and arriving late affects you and other patients whose appointments are after yours.    Again, thank you for choosing [facility].  Our hope is that these requests will decrease the amount of time that you wait before being seen by our physicians.       _____________________________________________________________  Should you have questions after your visit to [facility], please contact our office at [phone] between the hours of 8:30 a.m. and 5:00 p.m.  Voicemails left after 4:30 p.m. will not  be returned until the following business day.  For prescription refill requests, have your pharmacy contact our office with your prescription refill request.

## 2012-11-19 ENCOUNTER — Other Ambulatory Visit (HOSPITAL_COMMUNITY): Payer: Self-pay | Admitting: Oncology

## 2012-11-19 DIAGNOSIS — C50912 Malignant neoplasm of unspecified site of left female breast: Secondary | ICD-10-CM

## 2012-11-20 ENCOUNTER — Encounter (HOSPITAL_COMMUNITY)
Admission: RE | Admit: 2012-11-20 | Discharge: 2012-11-20 | Disposition: A | Discharge status: Home / Self Care | Payer: Medicare HMO | Source: Ambulatory Visit | Attending: Oncology | Admitting: Oncology

## 2012-11-20 DIAGNOSIS — C50919 Malignant neoplasm of unspecified site of unspecified female breast: Secondary | ICD-10-CM | POA: Insufficient documentation

## 2012-11-20 DIAGNOSIS — C50912 Malignant neoplasm of unspecified site of left female breast: Secondary | ICD-10-CM

## 2012-11-20 DIAGNOSIS — I2699 Other pulmonary embolism without acute cor pulmonale: Secondary | ICD-10-CM

## 2012-11-20 DIAGNOSIS — I82402 Acute embolism and thrombosis of unspecified deep veins of left lower extremity: Secondary | ICD-10-CM

## 2012-11-20 LAB — GLUCOSE, CAPILLARY: Glucose-Capillary: 83 mg/dL (ref 70–99)

## 2012-11-20 MED ORDER — FLUDEOXYGLUCOSE F - 18 (FDG) INJECTION
17.7000 | Freq: Once | INTRAVENOUS | Status: AC | PRN
  Administered 2012-11-20: 17.7 via INTRAVENOUS

## 2012-11-21 IMAGING — MG MM DIAGNOSTIC UNILATERAL L
4 series · 4 of 4 positions shown · non-contrast
Comparison: Prior studies

CLINICAL DATA: Recall from screening mammography.

DIGITAL DIAGNOSTIC LEFT BREAST MAMMOGRAM WITH CAD AND LEFT BREAST
ULTRASOUND:

[L CC (1 of 2)]
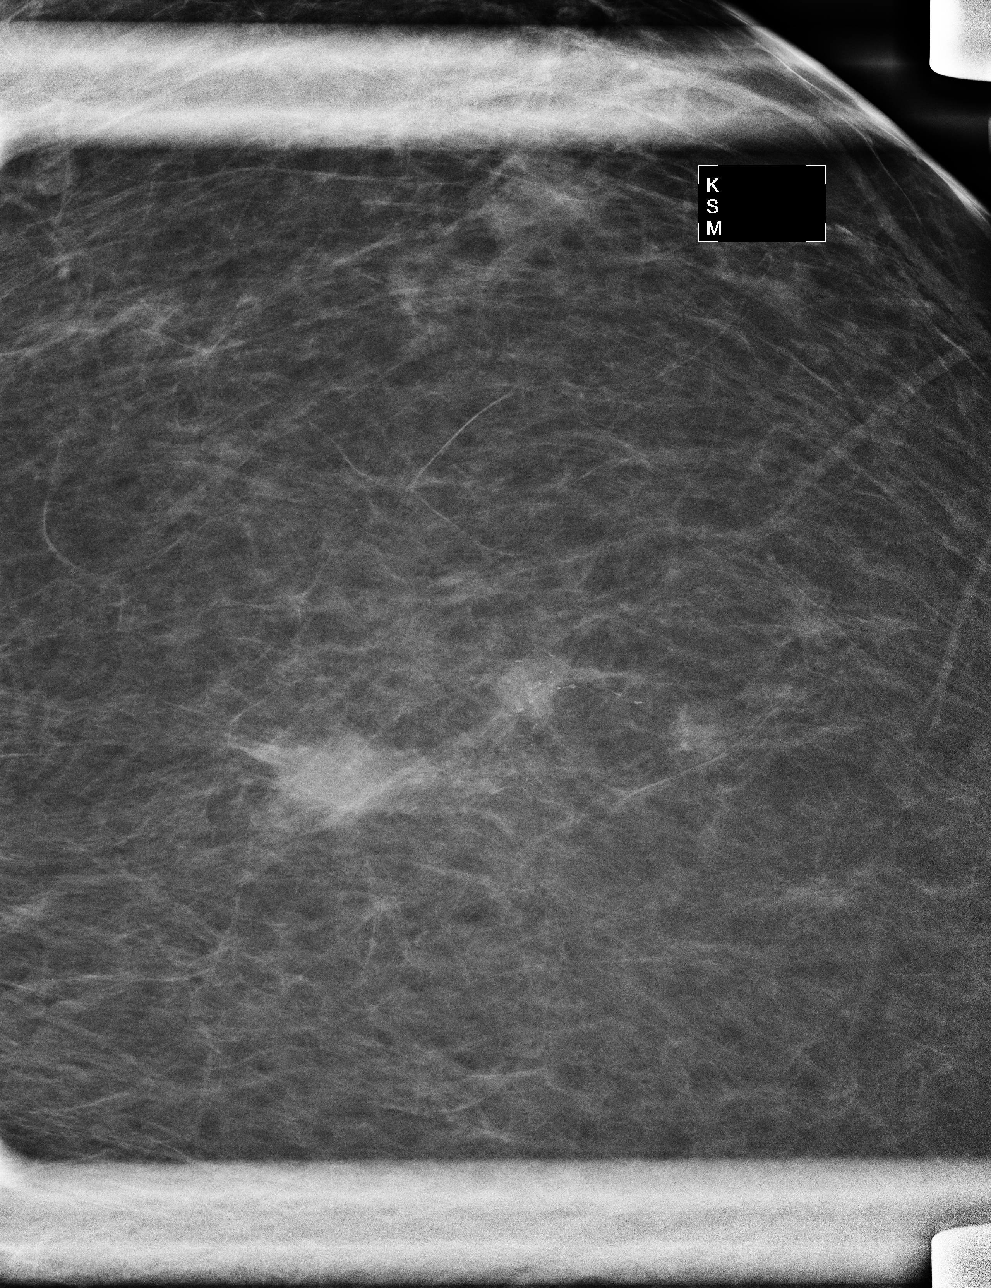

[L MLO]
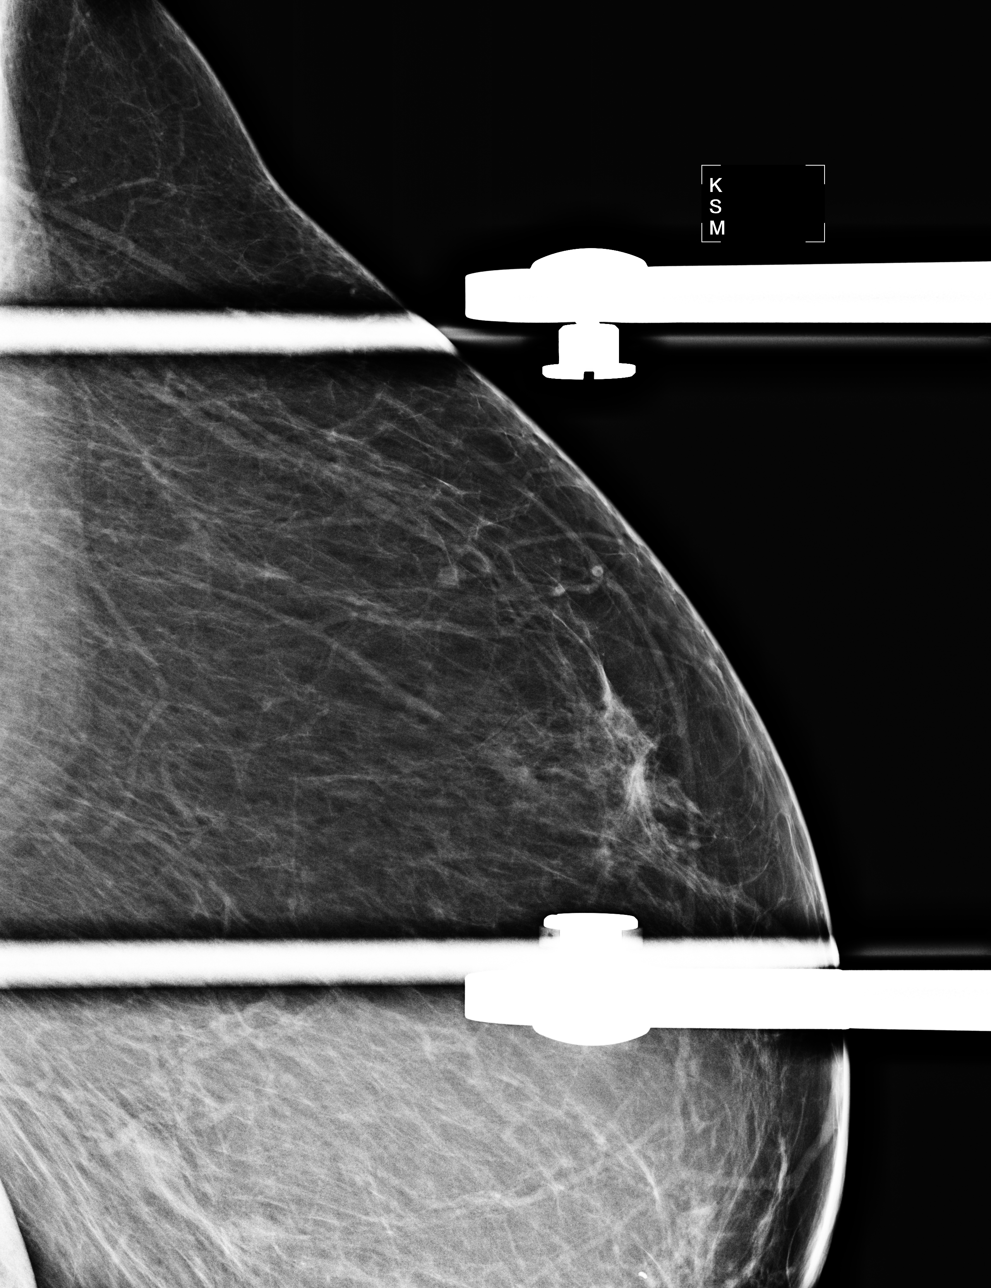

[L ML]
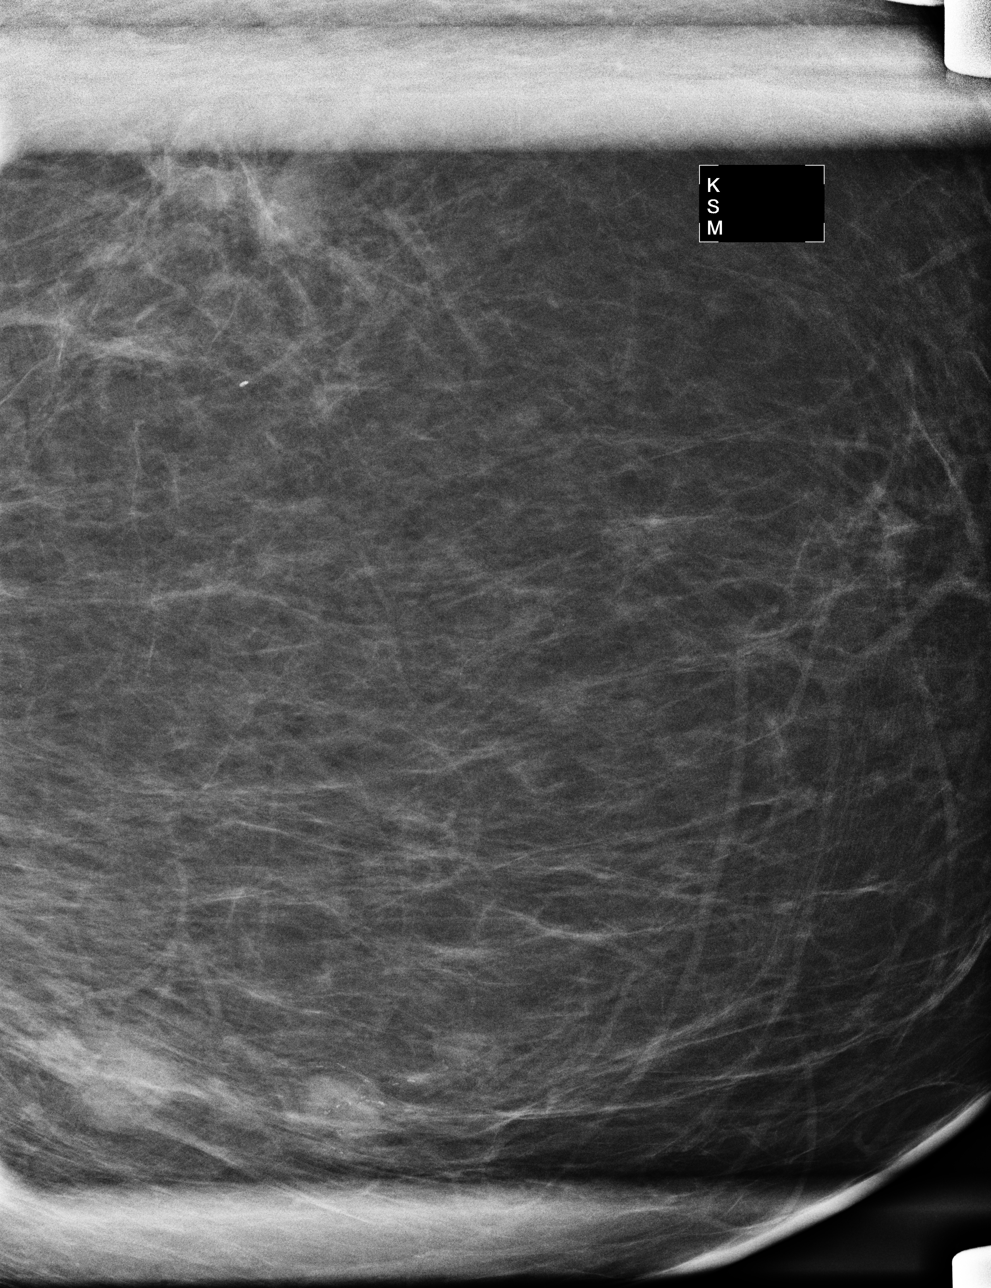

[L CC (2 of 2)]
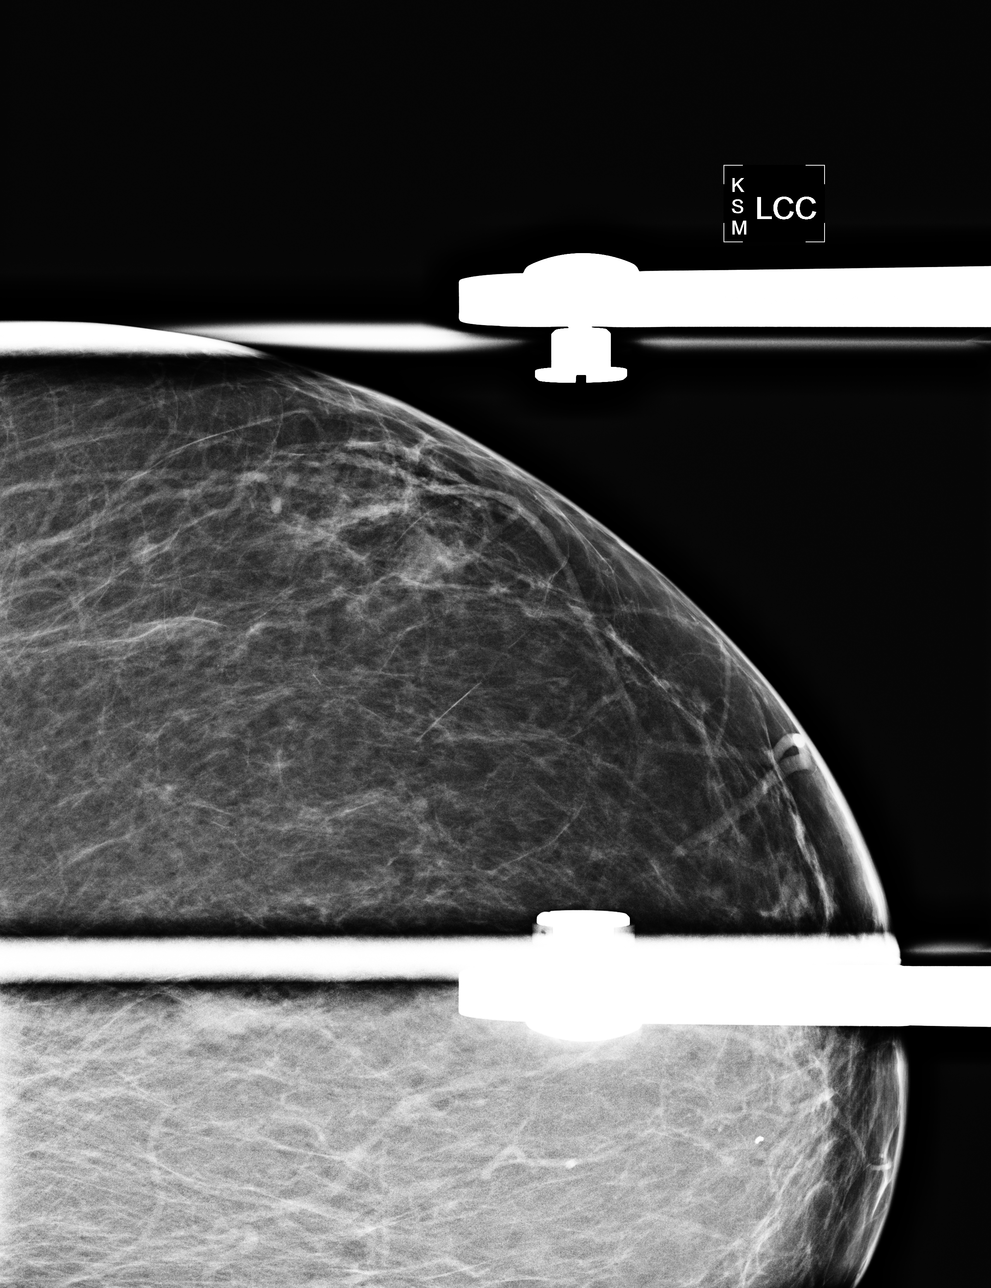

[4 of 4 positions shown; findings below may reference images not displayed]

FINDINGS: The patient has developed two adjacent  ill-defined
masses within the lateral left breast located at the 3 o'clock
position.  The more posterolaterally located mass measures 1.4 cm
in size and the more anterolaterally located mass measures 1.2 cm
in size.  In aggregate, the masses span 4.7 cm.  In addition, there
are worrisome pleomorphic microcalcifications associated with the
more anteromedially located mass.  There is ill-defined increased
density located within the upper-outer quadrant left breast which
represents  a change when compared to prior studies.
Mammographic images were processed with CAD.

On physical exam, there is no discrete palpable abnormality.

Ultrasound is performed, showing two adjacent irregularly
marginated hypoechoic masses located within the left breast at the
3 o'clock position 10 cm from the nipple.  The more laterally
located mass measures 1.5 cm in diameter by ultrasound and the more
medially located mass measures 9 mm in diameter. In aggregate, by
ultrasound, the masses span a 2.9 cm distance.

Ultrasound of the upper-outer quadrant of the left breast in the
area of increased parenchymal density noted on mammography
demonstrates an oval irregular heterogeneous mass which measures
approximately 1.4 x 0.4 x 0.9 cm in size. This may represent an
area of solid DCIS or possibly an area of apocrine metaplasia.

Ultrasound of the left axilla demonstrates an enlarged left
axillary lymph node with loss of normal fatty hilar region.  This
is 2.3 cm in size.

.
IMPRESSION: The findings are worrisome for invasive mammary carcinoma located
within the left breast at the 3 o'clock position with possible
additional focus of invasive or in situ carcinoma located within
the upper-outer quadrant of the left breast at the two o'clock
position and an enlarged left axillary lymph node worrisome for
axillary adenopathy.  Tissue sampling is recommended.  I have
discussed ultrasound-guided core biopsy of these masses with the
patient.  She would like to proceed.  This will be performed and
reported separately.

BI-RADS CATEGORY 5:  Highly suggestive of malignancy - appropriate
action should be taken.

## 2012-11-21 IMAGING — US US BREAST L
1 series · 13 of 25 positions shown · non-contrast
Comparison: Prior studies

CLINICAL DATA: Recall from screening mammography.

DIGITAL DIAGNOSTIC LEFT BREAST MAMMOGRAM WITH CAD AND LEFT BREAST
ULTRASOUND:

[Series 1: us breast left · 13 of 59 slices shown]
[im 1/59]
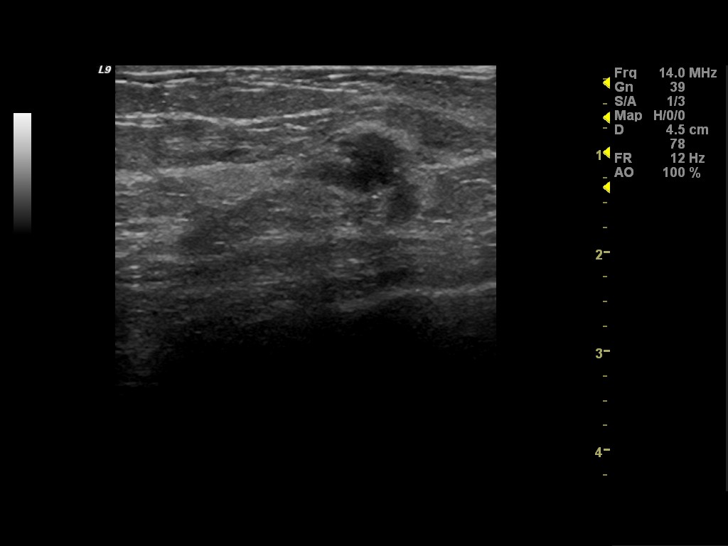
[im 5/59]
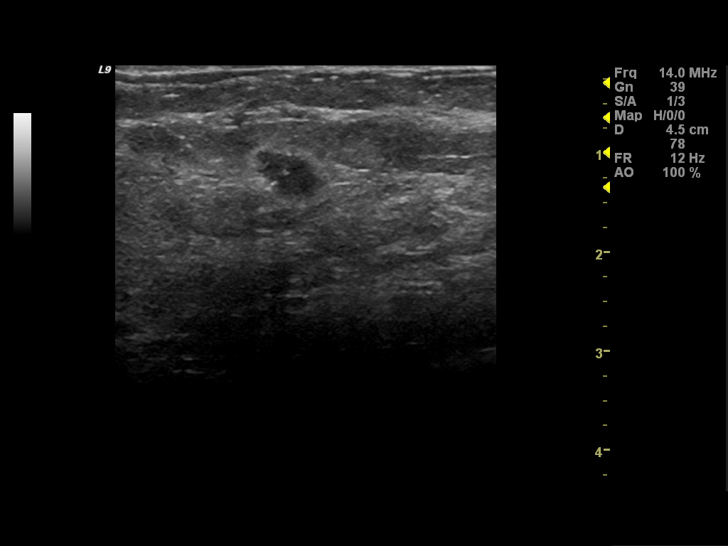
[im 10/59]
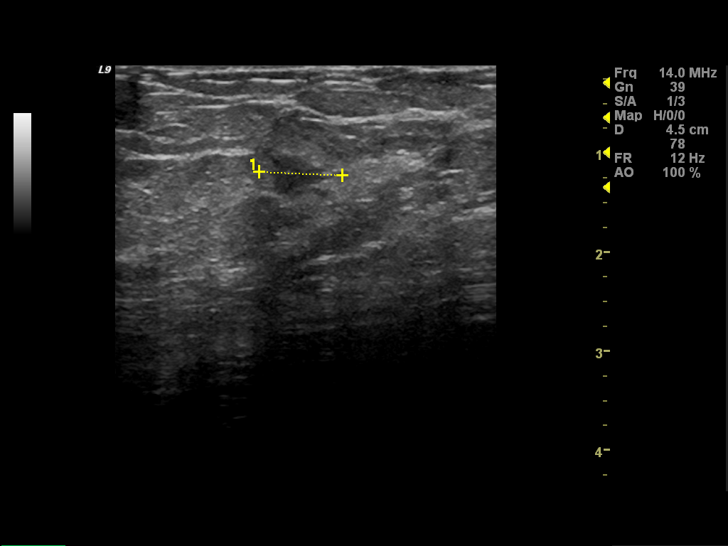
[im 15/59]
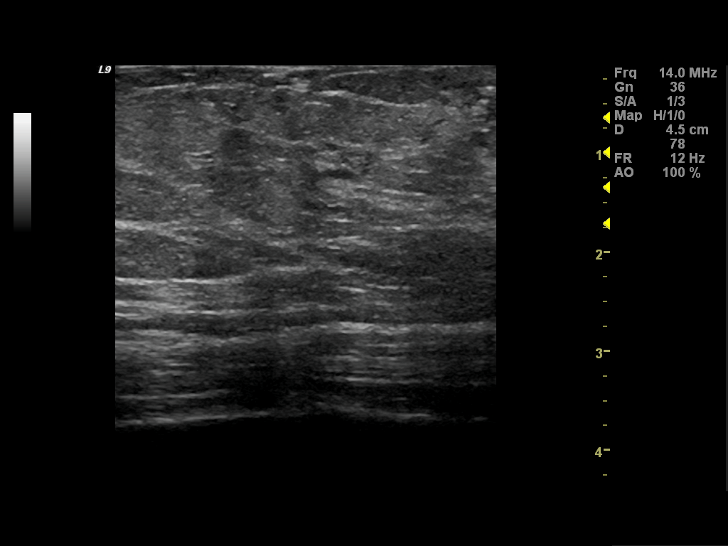
[im 20/59]
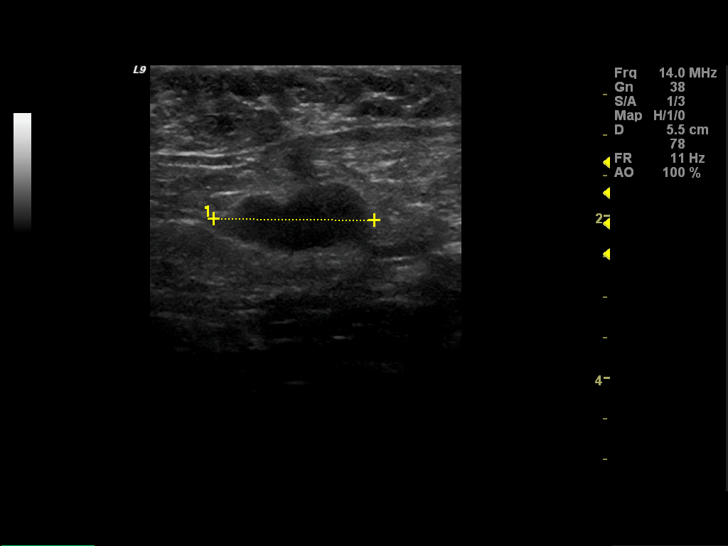
[im 25/59]
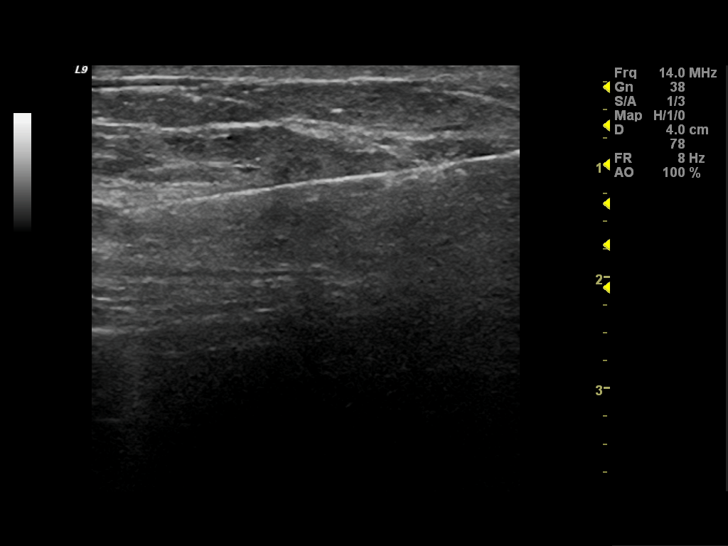
[im 30/59]
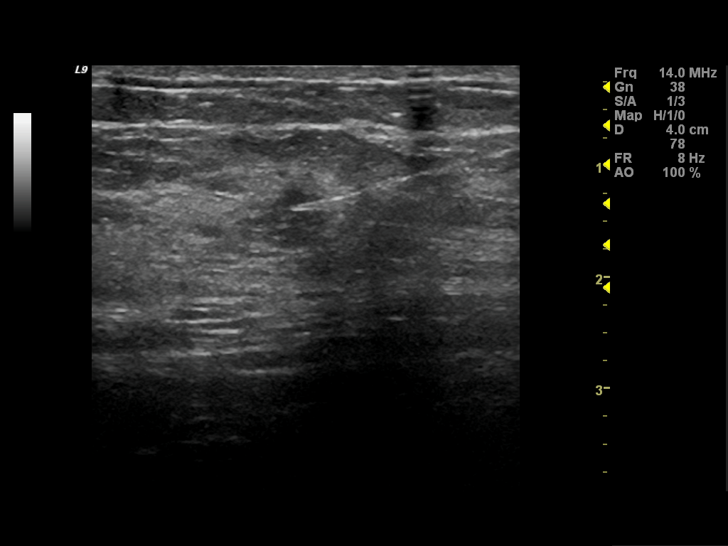
[im 34/59]
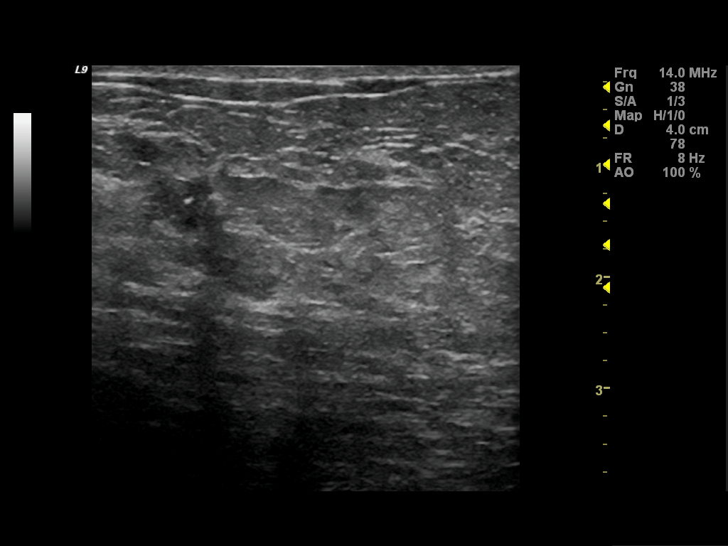
[im 39/59]
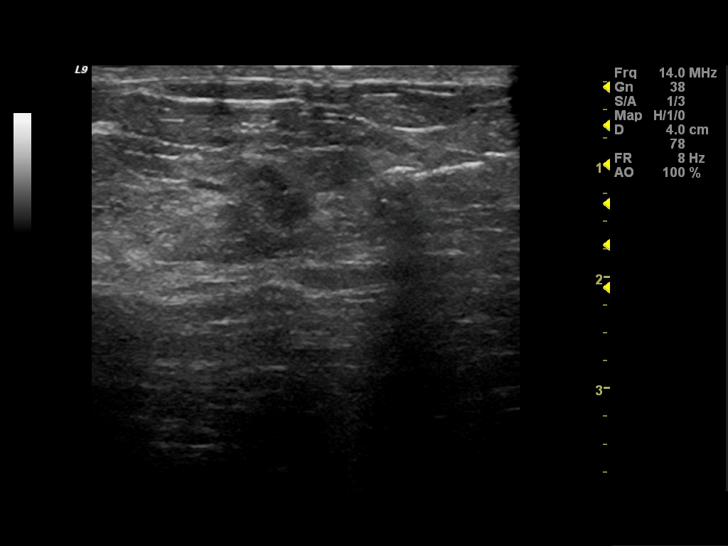
[im 44/59]
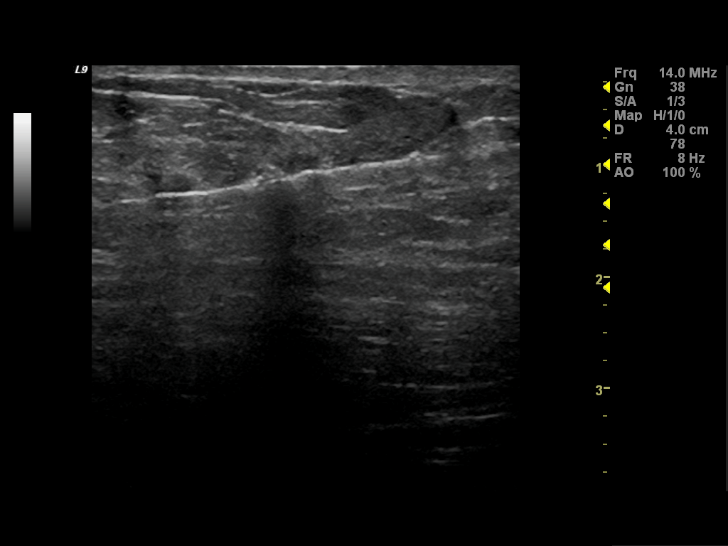
[im 49/59]
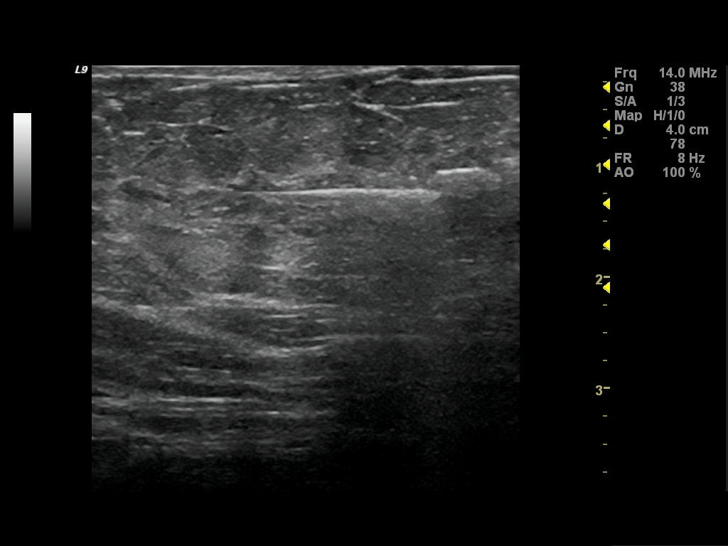
[im 54/59]
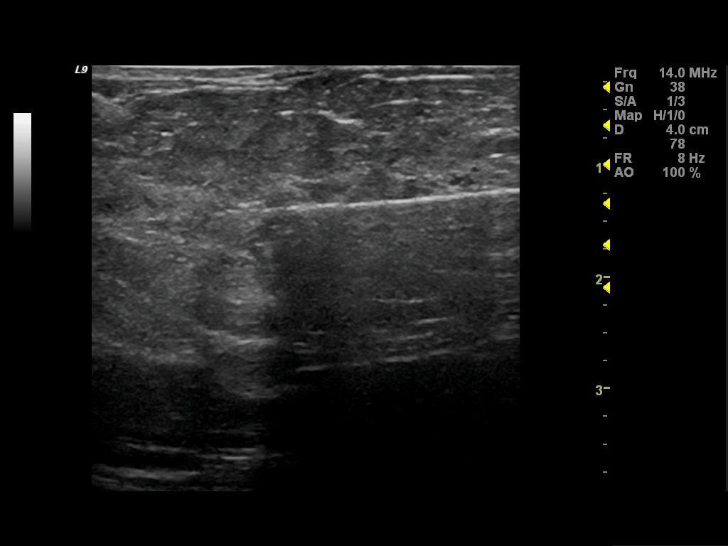
[im 59/59]
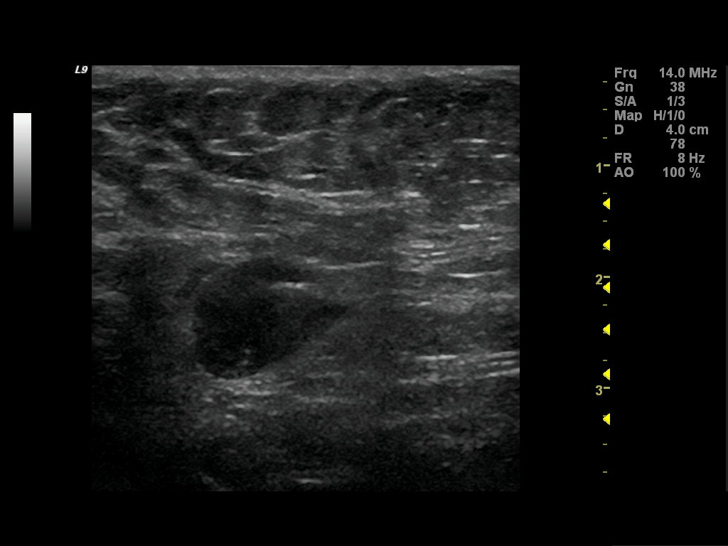

[13 of 25 positions shown; findings below may reference images not displayed]

FINDINGS: The patient has developed two adjacent  ill-defined
masses within the lateral left breast located at the 3 o'clock
position.  The more posterolaterally located mass measures 1.4 cm
in size and the more anterolaterally located mass measures 1.2 cm
in size.  In aggregate, the masses span 4.7 cm.  In addition, there
are worrisome pleomorphic microcalcifications associated with the
more anteromedially located mass.  There is ill-defined increased
density located within the upper-outer quadrant left breast which
represents  a change when compared to prior studies.
Mammographic images were processed with CAD.

On physical exam, there is no discrete palpable abnormality.

Ultrasound is performed, showing two adjacent irregularly
marginated hypoechoic masses located within the left breast at the
3 o'clock position 10 cm from the nipple.  The more laterally
located mass measures 1.5 cm in diameter by ultrasound and the more
medially located mass measures 9 mm in diameter. In aggregate, by
ultrasound, the masses span a 2.9 cm distance.

Ultrasound of the upper-outer quadrant of the left breast in the
area of increased parenchymal density noted on mammography
demonstrates an oval irregular heterogeneous mass which measures
approximately 1.4 x 0.4 x 0.9 cm in size. This may represent an
area of solid DCIS or possibly an area of apocrine metaplasia.

Ultrasound of the left axilla demonstrates an enlarged left
axillary lymph node with loss of normal fatty hilar region.  This
is 2.3 cm in size.

.
IMPRESSION: The findings are worrisome for invasive mammary carcinoma located
within the left breast at the 3 o'clock position with possible
additional focus of invasive or in situ carcinoma located within
the upper-outer quadrant of the left breast at the two o'clock
position and an enlarged left axillary lymph node worrisome for
axillary adenopathy.  Tissue sampling is recommended.  I have
discussed ultrasound-guided core biopsy of these masses with the
patient.  She would like to proceed.  This will be performed and
reported separately.

BI-RADS CATEGORY 5:  Highly suggestive of malignancy - appropriate
action should be taken.

## 2012-11-25 ENCOUNTER — Telehealth (HOSPITAL_COMMUNITY): Payer: Self-pay

## 2012-11-25 NOTE — Telephone Encounter (Signed)
Patient called to inform of results from scan.  Also informed of need to see Dr. Lovell Sheehan for possible port removal.  Patient verbalized understanding.

## 2012-12-04 ENCOUNTER — Encounter (HOSPITAL_COMMUNITY): Payer: Self-pay | Admitting: Pharmacy Technician

## 2012-12-04 ENCOUNTER — Ambulatory Visit: Payer: Medicare HMO | Admitting: Family Medicine

## 2012-12-04 NOTE — H&P (Signed)
Lisa Klein is an 79 y.o. female.   Chief Complaint: *portacath removal** HPI: *79yo bf with h/o left breast cancer who has finished chemotherapy and now presents for portacath removal.**  Past Medical History  Diagnosis Date  . DJD (degenerative joint disease)   . Hypertension   . Hypothyroidism   . Obesity   . Degenerative disc disease     with nerve compression   . Hypothyroidism   . Hypertension   . Clotting disorder 05/2011    dvt, right leg  . Cancer 2012    LEFT BREAST    Past Surgical History  Procedure Laterality Date  . Vesicovaginal fistula closure w/ tah      [facility]  . Cholecystectomy  80'S    [facility]  . Mastectomy modified radical  02/14/11    left  . Portacath placement  04/16/2011    Procedure: INSERTION PORT-A-CATH;  Surgeon: Dalia Heading;  Location: [facility];  Service: General;  Laterality: Right;  right subclavian  . Cholecystectomy    . Breast surgery      left total mastectomy    Family History  Problem Relation Age of Onset  . COPD Father   . Lung disease Father   . Hypertension Sister   . Anesthesia problems Neg Hx   . Malignant hyperthermia Neg Hx   . Hypotension Neg Hx   . Pseudochol deficiency Neg Hx    Social History:  reports that she has never smoked. She has never used smokeless tobacco. She reports that she does not drink alcohol or use illicit drugs.  Allergies: No Known Allergies  No prescriptions prior to admission    No results found for this or any previous visit (from the past 48 hour(s)). No results found.  Review of Systems  Constitutional: Negative.   Respiratory: Positive for shortness of breath.   All other systems reviewed and are negative.    There were no vitals taken for this visit. Physical Exam  Constitutional: She is oriented to person, place, and time. She appears well-developed and well-nourished.  HENT:  Head: Normocephalic and atraumatic.  Neck: Normal range of motion. Neck supple.  Cardiovascular:  Normal rate, regular rhythm and normal heart sounds.   Respiratory: Effort normal and breath sounds normal.  Portacath in place right upper chest.  GI: Soft. Bowel sounds are normal.  Neurological: She is alert and oriented to person, place, and time.  Skin: Skin is warm and dry.     Assessment/Plan *Imp:  Left breast cancer, finished with chemotherapy Plan:  Scheduled for portacath removal on 12/05/12.  To stop anticoagulation on 12/04/12.**  Renaldo Gornick A 12/04/2012, 10:44 AM

## 2012-12-05 ENCOUNTER — Encounter (HOSPITAL_COMMUNITY)
Admission: RE | Disposition: A | Discharge status: Home / Self Care | Payer: Self-pay | Source: Ambulatory Visit | Attending: General Surgery

## 2012-12-05 ENCOUNTER — Ambulatory Visit (HOSPITAL_COMMUNITY)
Admission: RE | Admit: 2012-12-05 | Discharge: 2012-12-05 | Disposition: A | Discharge status: Home / Self Care | Payer: Medicare HMO | Source: Ambulatory Visit | Attending: General Surgery | Admitting: General Surgery

## 2012-12-05 ENCOUNTER — Encounter (HOSPITAL_COMMUNITY): Payer: Self-pay | Admitting: *Deleted

## 2012-12-05 DIAGNOSIS — C50919 Malignant neoplasm of unspecified site of unspecified female breast: Secondary | ICD-10-CM | POA: Insufficient documentation

## 2012-12-05 DIAGNOSIS — I10 Essential (primary) hypertension: Secondary | ICD-10-CM | POA: Insufficient documentation

## 2012-12-05 DIAGNOSIS — Z452 Encounter for adjustment and management of vascular access device: Secondary | ICD-10-CM | POA: Insufficient documentation

## 2012-12-05 HISTORY — PX: PORT-A-CATH REMOVAL: SHX5289

## 2012-12-05 SURGERY — MINOR REMOVAL PORT-A-CATH
Anesthesia: LOCAL

## 2012-12-05 MED ORDER — LIDOCAINE HCL (PF) 1 % IJ SOLN
INTRAMUSCULAR | Status: AC
  Filled 2012-12-05: qty 30

## 2012-12-05 MED ORDER — LIDOCAINE HCL (PF) 1 % IJ SOLN
INTRAMUSCULAR | Status: DC | PRN
  Administered 2012-12-05: 4 mL

## 2012-12-05 SURGICAL SUPPLY — 3 items
DERMABOND ADVANCED (GAUZE/BANDAGES/DRESSINGS) ×1
DERMABOND ADVANCED .7 DNX12 (GAUZE/BANDAGES/DRESSINGS) ×1 IMPLANT
DURAPREP 6ML APPLICATOR 50/CS (WOUND CARE) ×2 IMPLANT

## 2012-12-05 NOTE — Op Note (Signed)
Patient:  Lisa Klein  DOB:  04-23-1930  MRN:  [id number]   Preop Diagnosis:   Left breast carcinoma, finished with chemotherapy  Postop Diagnosis:  Same  Procedure:  Port-A-Cath removal  Surgeon:  Franky Macho, M.D.  Anes:  Local  Indications:  Patient is an 79 year old black female with a history of left breast cancer for staging chemotherapy. She now presents for Port-A-Cath removal. Risks and benefits of the procedure were fully explained to the patient, who gave informed consent.  Procedure note:  The patient was placed in the supine position. The right upper chest was prepped and draped using usual sterile technique with DuraPrep. Surgical site confirmation was performed. 1% Xylocaine was used for local anesthesia.  An incision was made to the previous surgical scar. The dissection was taken down to the Port-A-Cath. The Port-A-Cath was removed in total without difficulty. It was disposed of. The subcutaneous layer was reapproximated using a 3-0 Vicryl interrupted suture. The skin was closed using a 4 Vicryl subcuticular suture. Dermabond was then applied.  All tape and needle counts were correct at the end of the procedure. The patient was discharged from the minor procedure room in good and stable condition.  Complications:  None  EBL:  Minimal  Specimen:  Port-A-Cath, disposed of

## 2012-12-05 NOTE — Interval H&P Note (Signed)
History and Physical Interval Note:  12/05/2012 7:31 AM  Lisa Klein  has presented today for surgery, with the diagnosis of left breast cancer  The various methods of treatment have been discussed with the patient and family. After consideration of risks, benefits and other options for treatment, the patient has consented to  Procedure(s) with comments: MINOR REMOVAL PORT-A-CATH (N/A) - In Minor Room as a surgical intervention .  The patient's history has been reviewed, patient examined, no change in status, stable for surgery.  I have reviewed the patient's chart and labs.  Questions were answered to the patient's satisfaction.     Franky Macho A

## 2012-12-08 ENCOUNTER — Encounter (HOSPITAL_COMMUNITY): Payer: Self-pay | Admitting: General Surgery

## 2013-01-05 ENCOUNTER — Encounter (HOSPITAL_COMMUNITY): Payer: Self-pay | Admitting: Oncology

## 2013-01-05 ENCOUNTER — Encounter (HOSPITAL_COMMUNITY): Payer: Medicare HMO | Attending: Oncology | Admitting: Oncology

## 2013-01-05 VITALS — BP 139/82 | HR 100 | Temp 97.7°F | Resp 18 | Wt 186.2 lb

## 2013-01-05 DIAGNOSIS — C50419 Malignant neoplasm of upper-outer quadrant of unspecified female breast: Secondary | ICD-10-CM

## 2013-01-05 DIAGNOSIS — I2699 Other pulmonary embolism without acute cor pulmonale: Secondary | ICD-10-CM | POA: Insufficient documentation

## 2013-01-05 DIAGNOSIS — C50919 Malignant neoplasm of unspecified site of unspecified female breast: Secondary | ICD-10-CM | POA: Insufficient documentation

## 2013-01-05 DIAGNOSIS — I82402 Acute embolism and thrombosis of unspecified deep veins of left lower extremity: Secondary | ICD-10-CM

## 2013-01-05 DIAGNOSIS — I82409 Acute embolism and thrombosis of unspecified deep veins of unspecified lower extremity: Secondary | ICD-10-CM

## 2013-01-05 DIAGNOSIS — C50912 Malignant neoplasm of unspecified site of left female breast: Secondary | ICD-10-CM

## 2013-01-05 NOTE — Progress Notes (Signed)
#  1 stage IIIa adenocarcinoma the left breast status post modified radical mastectomy in 02/14/2011 followed by 6 cycles of epirubicin and Cytoxan with significant dose reductions followed by radiation therapy. She was then placed on Arimidex. She'll take that for 5 years. This was a grade 3 cancer. She had multifocal invasive disease. The largest focus was 1.5 cm. She did have involving the deep surgical margin of resection. She had 6 of 7 positive nodes involved and axilla. ER +100%, PR +14%, Ki-67 are 45%, HER-2/neu nonamplified. She started Arimidex on 11/19/2011. Thus far she has no evidence recurrent disease with a negative PET scan in April 2014.  #2 DVT bilaterally complicated by pulmonary emboli. She is no longer short winded and would like her oxygen removed from the home. She has 100% O2 sats today. She sleeps without oxygen all the time. She's used oxygen once in the last 2 months.  #3 significant decrease in performance status which is at least 2 possibly 3. She sits down more than 50% of the day easily. She has significant arthritis in the right knee.  Think she also needs a handicap parking sticker. She walks with this tremendous antalgic gait.  She has crepitations of the right knee in particular but a few on the left as well. Range of motion is definitely diminished on the right. She has puffiness of the ankles but she states that swelling occurs at the end of every day but she does not use her recliner and she should but more importantly the recliner does not elevate her feet above her waistline.  She needs to get a new recliner. She also needs to be more active but I don't think she could be.  Her vital signs are stable. Lymph node exam is negative throughout. Skin exam is unremarkable. Lungs are clear this time to auscultation and percussion. Left chest wall is negative. Only radiation therapy changes are present. This is in the form of pigmentation. The right breast is negative for  masses. Her heart shows a regular rhythm and rate without murmur rub or gallop. Abdomen remains soft and nontender without hepatosplenomegaly. Bowel sounds are normal. She has no obvious thyromegaly. She is alert and oriented. Her legs show puffiness of the ankles only.  I want her to stay on the xarelto as long as she is on the Arimidex.  Thus far she has no evidence recurrent disease.  She needs elevate her legs are regular basis. We will see her back in 3 months. It is fine from my standpoint have her oxygen removed in the home.

## 2013-01-05 NOTE — Patient Instructions (Addendum)
[facility] Discharge Instructions  RECOMMENDATIONS MADE BY THE CONSULTANT AND ANY TEST RESULTS WILL BE SENT TO YOUR REFERRING PHYSICIAN.  You are doing well. Continue Xarelto as prescribed. Let us know when you need refill. Keep legs elevated above your abdomen when sitting. Return to clinic in 3 months to see Jenita Seashore, PA. Report any issues/concerns to clinic as needed. An application for a Handicapped placard was given to patient.  Thank you for choosing [facility] to provide your oncology and hematology care.  To afford each patient quality time with our providers, please arrive at least 15 minutes before your scheduled appointment time.  With your help, our goal is to use those 15 minutes to complete the necessary work-up to ensure our physicians have the information they need to help with your evaluation and healthcare recommendations.    Effective January 1st, 2014, we ask that you re-schedule your appointment with our physicians should you arrive 10 or more minutes late for your appointment.  We strive to give you quality time with our providers, and arriving late affects you and other patients whose appointments are after yours.    Again, thank you for choosing [facility].  Our hope is that these requests will decrease the amount of time that you wait before being seen by our physicians.       _____________________________________________________________  Should you have questions after your visit to [facility], please contact our office at [phone] between the hours of 8:30 a.m. and 5:00 p.m.  Voicemails left after 4:30 p.m. will not be returned until the following business day.  For prescription refill requests, have your pharmacy contact our office with your prescription refill request.

## 2013-01-06 ENCOUNTER — Other Ambulatory Visit (HOSPITAL_COMMUNITY): Payer: Self-pay | Admitting: Oncology

## 2013-01-06 DIAGNOSIS — I2699 Other pulmonary embolism without acute cor pulmonale: Secondary | ICD-10-CM

## 2013-01-06 DIAGNOSIS — I82402 Acute embolism and thrombosis of unspecified deep veins of left lower extremity: Secondary | ICD-10-CM

## 2013-01-06 MED ORDER — RIVAROXABAN 20 MG PO TABS: 20.0000 mg | ORAL_TABLET | Freq: Every day | ORAL | Status: DC

## 2013-01-06 NOTE — Progress Notes (Signed)
Per Dr. Mariel Sleet, patient needs to have her Xarelto refilled x 3 refills.

## 2013-01-09 ENCOUNTER — Ambulatory Visit (HOSPITAL_COMMUNITY): Payer: Medicare HMO

## 2013-01-14 LAB — CBC WITH DIFFERENTIAL/PLATELET
Basophils Absolute: 0.1 10*3/uL (ref 0.0–0.1)
Eosinophils Absolute: 0.1 10*3/uL (ref 0.0–0.7)
Eosinophils Relative: 2 % (ref 0–5)
MCH: 28.3 pg (ref 26.0–34.0)
MCHC: 32.5 g/dL (ref 30.0–36.0)
MCV: 87.3 fL (ref 78.0–100.0)
Platelets: 193 10*3/uL (ref 150–400)
RDW: 14.6 % (ref 11.5–15.5)

## 2013-01-14 LAB — BASIC METABOLIC PANEL
CO2: 27 mEq/L (ref 19–32)
Calcium: 9.4 mg/dL (ref 8.4–10.5)
Creat: 0.92 mg/dL (ref 0.50–1.10)

## 2013-01-16 ENCOUNTER — Ambulatory Visit (HOSPITAL_COMMUNITY): Payer: Medicare HMO

## 2013-01-18 ENCOUNTER — Encounter (HOSPITAL_COMMUNITY): Payer: Self-pay | Admitting: Internal Medicine

## 2013-01-18 ENCOUNTER — Emergency Department (HOSPITAL_COMMUNITY): Payer: Medicare HMO

## 2013-01-18 ENCOUNTER — Inpatient Hospital Stay (HOSPITAL_COMMUNITY)
Admission: EM | Admit: 2013-01-18 | Discharge: 2013-01-21 | DRG: 482 | Disposition: A | Discharge status: Skilled Nursing Facility | Payer: Medicare HMO | Attending: Internal Medicine | Admitting: Internal Medicine

## 2013-01-18 DIAGNOSIS — R42 Dizziness and giddiness: Secondary | ICD-10-CM

## 2013-01-18 DIAGNOSIS — I2699 Other pulmonary embolism without acute cor pulmonale: Secondary | ICD-10-CM | POA: Diagnosis present

## 2013-01-18 DIAGNOSIS — W010XXA Fall on same level from slipping, tripping and stumbling without subsequent striking against object, initial encounter: Secondary | ICD-10-CM | POA: Diagnosis present

## 2013-01-18 DIAGNOSIS — S72009A Fracture of unspecified part of neck of unspecified femur, initial encounter for closed fracture: Secondary | ICD-10-CM

## 2013-01-18 DIAGNOSIS — Z853 Personal history of malignant neoplasm of breast: Secondary | ICD-10-CM

## 2013-01-18 DIAGNOSIS — Z0000 Encounter for general adult medical examination without abnormal findings: Secondary | ICD-10-CM

## 2013-01-18 DIAGNOSIS — S72002A Fracture of unspecified part of neck of left femur, initial encounter for closed fracture: Secondary | ICD-10-CM

## 2013-01-18 DIAGNOSIS — I10 Essential (primary) hypertension: Secondary | ICD-10-CM

## 2013-01-18 DIAGNOSIS — E039 Hypothyroidism, unspecified: Secondary | ICD-10-CM | POA: Diagnosis present

## 2013-01-18 DIAGNOSIS — E669 Obesity, unspecified: Secondary | ICD-10-CM

## 2013-01-18 DIAGNOSIS — I82403 Acute embolism and thrombosis of unspecified deep veins of lower extremity, bilateral: Secondary | ICD-10-CM

## 2013-01-18 DIAGNOSIS — Z86718 Personal history of other venous thrombosis and embolism: Secondary | ICD-10-CM

## 2013-01-18 DIAGNOSIS — S72033A Displaced midcervical fracture of unspecified femur, initial encounter for closed fracture: Principal | ICD-10-CM | POA: Diagnosis present

## 2013-01-18 DIAGNOSIS — M1990 Unspecified osteoarthritis, unspecified site: Secondary | ICD-10-CM

## 2013-01-18 DIAGNOSIS — Z86711 Personal history of pulmonary embolism: Secondary | ICD-10-CM

## 2013-01-18 DIAGNOSIS — I82409 Acute embolism and thrombosis of unspecified deep veins of unspecified lower extremity: Secondary | ICD-10-CM

## 2013-01-18 DIAGNOSIS — E785 Hyperlipidemia, unspecified: Secondary | ICD-10-CM

## 2013-01-18 DIAGNOSIS — C50919 Malignant neoplasm of unspecified site of unspecified female breast: Secondary | ICD-10-CM | POA: Diagnosis present

## 2013-01-18 LAB — CBC WITH DIFFERENTIAL/PLATELET
Basophils Absolute: 0 10*3/uL (ref 0.0–0.1)
Basophils Relative: 0 % (ref 0–1)
Eosinophils Absolute: 0.2 10*3/uL (ref 0.0–0.7)
Hemoglobin: 11.6 g/dL — ABNORMAL LOW (ref 12.0–15.0)
MCH: 28.8 pg (ref 26.0–34.0)
MCHC: 32.6 g/dL (ref 30.0–36.0)
Neutro Abs: 3 10*3/uL (ref 1.7–7.7)
Neutrophils Relative %: 60 % (ref 43–77)
Platelets: 146 10*3/uL — ABNORMAL LOW (ref 150–400)
RDW: 14.6 % (ref 11.5–15.5)

## 2013-01-18 LAB — BASIC METABOLIC PANEL
BUN: 11 mg/dL (ref 6–23)
Calcium: 9.6 mg/dL (ref 8.4–10.5)
GFR calc Af Amer: 74 mL/min — ABNORMAL LOW (ref >90)
GFR calc non Af Amer: 64 mL/min — ABNORMAL LOW (ref >90)
Glucose, Bld: 94 mg/dL (ref 70–99)
Potassium: 3.6 mEq/L (ref 3.5–5.1)

## 2013-01-18 LAB — PROTIME-INR
INR: 1.68 — ABNORMAL HIGH (ref 0.00–1.49)
Prothrombin Time: 19.2 s — ABNORMAL HIGH (ref 11.6–15.2)

## 2013-01-18 LAB — TROPONIN I: Troponin I: <0.3 ng/mL

## 2013-01-18 MED ORDER — LEVOTHYROXINE SODIUM 100 MCG PO TABS
100.0000 ug | ORAL_TABLET | Freq: Every day | ORAL | Status: DC
  Administered 2013-01-19: 100 ug via ORAL
  Filled 2013-01-18 (×2): qty 1

## 2013-01-18 MED ORDER — SODIUM CHLORIDE 0.9 % IV SOLN: INTRAVENOUS | Status: AC

## 2013-01-18 MED ORDER — MORPHINE SULFATE 2 MG/ML IJ SOLN
2.0000 mg | INTRAMUSCULAR | Status: DC | PRN
  Administered 2013-01-18: 2 mg via INTRAVENOUS
  Filled 2013-01-18: qty 1

## 2013-01-18 MED ORDER — SIMVASTATIN 10 MG PO TABS
10.0000 mg | ORAL_TABLET | Freq: Every day | ORAL | Status: DC
  Administered 2013-01-18: 10 mg via ORAL
  Filled 2013-01-18 (×2): qty 1

## 2013-01-18 MED ORDER — ONDANSETRON HCL 4 MG/2ML IJ SOLN
INTRAMUSCULAR | Status: AC
  Administered 2013-01-18: 4 mg via INTRAVENOUS
  Filled 2013-01-18: qty 2

## 2013-01-18 MED ORDER — DOCUSATE SODIUM 100 MG PO CAPS: 100.0000 mg | ORAL_CAPSULE | Freq: Every day | ORAL | Status: DC | PRN

## 2013-01-18 MED ORDER — HYDRALAZINE HCL 20 MG/ML IJ SOLN: 10.0000 mg | INTRAMUSCULAR | Status: DC | PRN

## 2013-01-18 MED ORDER — VITAMIN B-6 100 MG PO TABS
100.0000 mg | ORAL_TABLET | Freq: Every day | ORAL | Status: DC
  Filled 2013-01-18: qty 1

## 2013-01-18 MED ORDER — MORPHINE SULFATE 2 MG/ML IJ SOLN
0.5000 mg | INTRAMUSCULAR | Status: DC | PRN
  Administered 2013-01-19: 0.5 mg via INTRAVENOUS
  Filled 2013-01-18: qty 1

## 2013-01-18 MED ORDER — HEPARIN (PORCINE) IN NACL 100-0.45 UNIT/ML-% IJ SOLN
1100.0000 [IU]/h | INTRAMUSCULAR | Status: DC
  Administered 2013-01-18: 1100 [IU]/h via INTRAVENOUS
  Filled 2013-01-18 (×2): qty 250

## 2013-01-18 MED ORDER — ANASTROZOLE 1 MG PO TABS
1.0000 mg | ORAL_TABLET | Freq: Every day | ORAL | Status: DC
  Administered 2013-01-18 – 2013-01-20 (×3): 1 mg via ORAL
  Filled 2013-01-18 (×2): qty 1

## 2013-01-18 MED ORDER — HYDROCODONE-ACETAMINOPHEN 5-325 MG PO TABS
1.0000 | ORAL_TABLET | Freq: Four times a day (QID) | ORAL | Status: DC | PRN
  Administered 2013-01-18 – 2013-01-19 (×2): 2 via ORAL
  Administered 2013-01-20: 1 via ORAL
  Administered 2013-01-20 – 2013-01-21 (×2): 2 via ORAL
  Filled 2013-01-18 (×5): qty 2
  Filled 2013-01-18: qty 1
  Filled 2013-01-18: qty 2

## 2013-01-18 MED ORDER — SODIUM CHLORIDE 0.9 % IV SOLN: INTRAVENOUS | Status: DC

## 2013-01-18 MED ORDER — CALCIUM CARBONATE-VITAMIN D 500-200 MG-UNIT PO TABS
1.0000 | ORAL_TABLET | Freq: Two times a day (BID) | ORAL | Status: DC
  Administered 2013-01-18: 1 via ORAL
  Filled 2013-01-18 (×3): qty 1

## 2013-01-18 MED ORDER — ASPIRIN EC 81 MG PO TBEC
81.0000 mg | DELAYED_RELEASE_TABLET | Freq: Every day | ORAL | Status: DC
  Filled 2013-01-18: qty 1

## 2013-01-18 MED ORDER — SODIUM CHLORIDE 0.9 % IV SOLN
INTRAVENOUS | Status: DC
  Administered 2013-01-18: 17:00:00 via INTRAVENOUS

## 2013-01-18 MED ORDER — ONDANSETRON HCL 4 MG/2ML IJ SOLN: 4.0000 mg | Freq: Once | INTRAMUSCULAR | Status: AC

## 2013-01-18 NOTE — ED Notes (Signed)
Dr. Ashok Norris accepting doctor.

## 2013-01-18 NOTE — Progress Notes (Signed)
ANTICOAGULATION CONSULT NOTE - Initial Consult  Pharmacy Consult for heparin Indication: history of DVT  No Known Allergies  Patient Measurements: Height: 5\' 6"  (167.6 cm) Weight: 187 lb (84.823 kg) IBW/kg (Calculated) : 59.3 Heparin Dosing Weight: 77kg  Vital Signs: Temp: 99.6 F (37.6 C) (06/15 1855) Temp src: Oral (06/15 1855) BP: 144/60 mmHg (06/15 1855) Pulse Rate: 88 (06/15 1855)  Labs:  Recent Labs  01/18/13 1618  HGB 11.6*  HCT 35.6*  PLT 146*  APTT 39*  LABPROT 19.2*  INR 1.68*  CREATININE 0.84  TROPONINI <0.30    Estimated Creatinine Clearance: 58.6 ml/min (by C-G formula based on Cr of 0.84).   Medical History: Past Medical History  Diagnosis Date  . DJD (degenerative joint disease)   . Hypertension   . Hypothyroidism   . Obesity   . Degenerative disc disease     with nerve compression   . Hypothyroidism   . Hypertension   . Clotting disorder 05/2011    dvt, right leg  . Cancer 2012    LEFT BREAST    Medications:  Prescriptions prior to admission  Medication Sig Dispense Refill  . acetaminophen (TYLENOL) 500 MG tablet Take 500 mg by mouth every 6 (six) hours as needed for pain.      [name] anastrozole (ARIMIDEX) 1 MG tablet Take 1 mg by mouth at bedtime.      [name] aspirin EC 81 MG tablet Take 81 mg by mouth daily.      . calcium-vitamin D (OSCAL 500/200 D-3) 500-200 MG-UNIT per tablet Take 1 tablet by mouth 2 (two) times daily.       [name] docusate sodium (COLACE) 100 MG capsule Take 100 mg by mouth daily as needed for constipation. For constipation      . hydrochlorothiazide (MICROZIDE) 12.5 MG capsule Take 1 capsule (12.5 mg total) by mouth every morning.  90 capsule  1  . levothyroxine (SYNTHROID, LEVOTHROID) 100 MCG tablet Take 1 tablet (100 mcg total) by mouth every morning.  90 tablet  1  . lovastatin (MEVACOR) 20 MG tablet Take 1 tablet (20 mg total) by mouth at bedtime.  90 tablet  1  . potassium chloride (K-DUR,KLOR-CON) 10 MEQ tablet Take 1  tablet (10 mEq total) by mouth 2 (two) times daily.  180 tablet  1  . pyridOXINE (VITAMIN B-6) 100 MG tablet Take 100 mg by mouth daily.      . Rivaroxaban (XARELTO) 20 MG TABS Take 1 tablet (20 mg total) by mouth daily with supper.  30 tablet  3  . acetaminophen (TYLENOL) 650 MG CR tablet Take 650 mg by mouth daily as needed for pain.        Scheduled:  . anastrozole  1 mg Oral QHS  . [START ON 01/19/2013] aspirin EC  81 mg Oral Daily  . calcium-vitamin D  1 tablet Oral BID  . [START ON 01/19/2013] levothyroxine  100 mcg Oral QAC breakfast  . [START ON 01/19/2013] pyridOXINE  100 mg Oral Daily  . simvastatin  10 mg Oral q1800    Assessment: 79 yo female on Xarelto PTA for history of DVT and noted here with L hip fx.  SCr= 0.84, CrCl ~60  Last dose of Xarelto was 6/14 with supper  Coags: 6/15 at ~ 1600 Protime= 19.2, INR= 1.68 APTT= 39  Goal of Therapy:  Heparin level 0.3-0.7 units/ml APTT 66-102 Monitor platelets by anticoagulation protocol: Yes   Plan:   -Heparin can be started 24 hrs  post last Xarelto dose so can begin heparin tonight -No heparin bolus -Heparin infusion at 1100 units/hr (~14 units/kg/hr) -aPTT and heparin level in 8hrs and daily for now -Daily CBC -Per consult order- Have to stop Heparin 4 hours befor surgery -Will follow surgery plans  Harland German, Pharm D 01/18/2013 9:26 PM

## 2013-01-18 NOTE — Progress Notes (Signed)
PENDING ACCEPTANCE TRANFER NOTE:  Call received from:  Dr. Clarene Duke  REASON FOR REQUESTING TRANSFER:    Left hip fracture  HPI:   79 year old female with past history of DVT, she is on Xarleto, fell and broke her left hip. In a pattern does not have orthopedics coverage over the weekend, Dr. Clarene Duke talk to Dr. supple of the orthopedics was to center to [facility] for further surgical evaluation. Telemetry, inpatient.  PLAN:  According to telephone report, this patient was accepted for transfer to [facility],   Under [facility] team:  10,  I have requested an order be written to call Flow Manager at [phone] upon patient arrival to the floor for final physician assignment who will do the admission and give admitting orders.  SIGNED: Clint Lipps, MD [facility]  01/18/2013, 5:19 PM

## 2013-01-18 NOTE — ED Notes (Signed)
The patient states that she does not need anything for pain at present.

## 2013-01-18 NOTE — Progress Notes (Signed)
Orthopedic Tech Progress Note Patient Details:  Lisa Klein 1929-09-08 [id number] RN to call for overhead frame after surgery. Patient ID: Lisa Klein, female   DOB: 1930/01/04, 79 y.o.   MRN: [id number]   Lisa Klein 01/18/2013, 10:25 PM

## 2013-01-18 NOTE — Consult Note (Signed)
Reason for Consult: Left Hip fracture Referring Physician: [facility]  Lisa Klein is an 79 y.o. female.  HPI: Pleasant elderly female was seen by request from [facility]. She was transported from [place] for a Left Hip fracture. She was walking in her home when she fell. She immediately had pain. This occurred on Saturday 01/17/13. She reports soreness of the left hip. She is on Xarleto for DVT history. Her last dose was 01/17/13. Denies any SOB, CP, or calf pain.  Past Medical History  Diagnosis Date  . DJD (degenerative joint disease)   . Hypertension   . Hypothyroidism   . Obesity   . Degenerative disc disease     with nerve compression   . Hypothyroidism   . Hypertension   . Clotting disorder 05/2011    dvt, right leg  . Cancer 2012    LEFT BREAST    Past Surgical History  Procedure Laterality Date  . Vesicovaginal fistula closure w/ tah      [facility]  . Cholecystectomy  80'S    [facility]  . Mastectomy modified radical  02/14/11    left  . Portacath placement  04/16/2011    Procedure: INSERTION PORT-A-CATH;  Surgeon: Dalia Heading;  Location: [facility];  Service: General;  Laterality: Right;  right subclavian  . Cholecystectomy    . Breast surgery      left total mastectomy  . Port-a-cath removal N/A 12/05/2012    Procedure: MINOR REMOVAL PORT-A-CATH;  Surgeon: Dalia Heading, MD;  Location: [facility];  Service: General;  Laterality: N/A;  In Minor Room    Family History  Problem Relation Age of Onset  . COPD Father   . Lung disease Father   . Hypertension Sister   . Anesthesia problems Neg Hx   . Malignant hyperthermia Neg Hx   . Hypotension Neg Hx   . Pseudochol deficiency Neg Hx     Social History:  reports that she has never smoked. She has never used smokeless tobacco. She reports that she does not drink alcohol or use illicit drugs.  Allergies: No Known Allergies  Medications: I have reviewed the patient's current medications.  Results for orders  placed during the hospital encounter of 01/18/13 (from the past 48 hour(s))  BASIC METABOLIC PANEL     Status: Abnormal   Collection Time    01/18/13  4:18 PM      Result Value Range   Sodium 140  135 - 145 mEq/L   Potassium 3.6  3.5 - 5.1 mEq/L   Chloride 102  96 - 112 mEq/L   CO2 29  19 - 32 mEq/L   Glucose, Bld 94  70 - 99 mg/dL   BUN 11  6 - 23 mg/dL   Creatinine, Ser 1.61  0.50 - 1.10 mg/dL   Calcium 9.6  8.4 - 09.6 mg/dL   GFR calc non Af Amer 64 (*) >90 mL/min   GFR calc Af Amer 74 (*) >90 mL/min   Comment:            The eGFR has been calculated     using the CKD EPI equation.     This calculation has not been     validated in all clinical     situations.     eGFR's persistently     <90 mL/min signify     possible Chronic Kidney Disease.  CBC WITH DIFFERENTIAL     Status: Abnormal   Collection Time  01/18/13  4:18 PM      Result Value Range   WBC 5.1  4.0 - 10.5 K/uL   RBC 4.03  3.87 - 5.11 MIL/uL   Hemoglobin 11.6 (*) 12.0 - 15.0 g/dL   HCT 16.1 (*) 09.6 - 04.5 %   MCV 88.3  78.0 - 100.0 fL   MCH 28.8  26.0 - 34.0 pg   MCHC 32.6  30.0 - 36.0 g/dL   RDW 40.9  81.1 - 91.4 %   Platelets 146 (*) 150 - 400 K/uL   Neutrophils Relative % 60  43 - 77 %   Neutro Abs 3.0  1.7 - 7.7 K/uL   Lymphocytes Relative 25  12 - 46 %   Lymphs Abs 1.3  0.7 - 4.0 K/uL   Monocytes Relative 12  3 - 12 %   Monocytes Absolute 0.6  0.1 - 1.0 K/uL   Eosinophils Relative 3  0 - 5 %   Eosinophils Absolute 0.2  0.0 - 0.7 K/uL   Basophils Relative 0  0 - 1 %   Basophils Absolute 0.0  0.0 - 0.1 K/uL  TROPONIN I     Status: None   Collection Time    01/18/13  4:18 PM      Result Value Range   Troponin I <0.30  <0.30 ng/mL   Comment:            Due to the release kinetics of cTnI,     a negative result within the first hours     of the onset of symptoms does not rule out     myocardial infarction with certainty.     If myocardial infarction is still suspected,     repeat the test at  appropriate intervals.  PROTIME-INR     Status: Abnormal   Collection Time    01/18/13  4:18 PM      Result Value Range   Prothrombin Time 19.2 (*) 11.6 - 15.2 seconds   INR 1.68 (*) 0.00 - 1.49  APTT     Status: Abnormal   Collection Time    01/18/13  4:18 PM      Result Value Range   aPTT 39 (*) 24 - 37 seconds   Comment:            IF BASELINE aPTT IS ELEVATED,     SUGGEST PATIENT RISK ASSESSMENT     BE USED TO DETERMINE APPROPRIATE     ANTICOAGULANT THERAPY.    Dg Hip Complete Left  01/18/2013   *RADIOLOGY REPORT*  Clinical Data: Pain post fall  LEFT HIP - COMPLETE 2+ VIEW  Comparison: None.  Findings: Five views of the left hip submitted.  There is mild impacted fracture of the left femoral neck. Multiple pelvic phleboliths.  IMPRESSION: Mild impacted fracture of the left femoral neck.   Original Report Authenticated By: Natasha Mead, M.D.   Dg Chest Port 1 View  01/18/2013   *RADIOLOGY REPORT*  Clinical Data: Fall.  Preop respiratory exam for left hip fracture. Breast cancer.  CHEST - 2 VIEW  Comparison:  10/17/2012  Findings:  The heart size and mediastinal contours are within normal limits.  Both lungs are clear.  The visualized skeletal structures are unremarkable.Surgical clips again noted in the left axilla.  IMPRESSION: No active cardiopulmonary disease.   Original Report Authenticated By: Myles Rosenthal, M.D.   Dg Knee Complete 4 Views Left  01/18/2013   *RADIOLOGY REPORT*  Clinical Data: Pain post  fall  LEFT KNEE - COMPLETE 4+ VIEW  Comparison: None.  Findings: Four views of the left knee submitted.  Diffuse narrowing of the joint space.  Narrowing of patellofemoral joint space.  No acute fracture or subluxation.  IMPRESSION: No acute fracture or subluxation.  Degenerative changes as described above.   Original Report Authenticated By: Natasha Mead, M.D.    Review of Systems  Constitutional: Negative.   HENT: Negative.   Eyes: Negative.   Respiratory: Negative.   Cardiovascular:  Negative.   Genitourinary: Negative.   Musculoskeletal: Positive for joint pain and falls. Negative for myalgias.       Left hip pain  Skin: Negative.   Neurological: Negative.   Endo/Heme/Allergies: Negative.   Psychiatric/Behavioral: Negative.    Blood pressure 131/52, pulse 74, temperature 98.2 F (36.8 C), temperature source Oral, resp. rate 16, height 5\' 5"  (1.651 m), weight 83.462 kg (184 lb), SpO2 99.00%. Physical Exam  Constitutional: She is oriented to person, place, and time. She appears well-developed and well-nourished.  HENT:  Head: Normocephalic and atraumatic.  Neck: Normal range of motion. Neck supple.  Cardiovascular: Normal rate, regular rhythm and normal heart sounds.   Respiratory: Effort normal and breath sounds normal.  GI: Soft. Bowel sounds are normal.  Genitourinary:  Deferred  Musculoskeletal: She exhibits tenderness.  Left hip region. Calf soft and non-tender.  Neurological: She is alert and oriented to person, place, and time.  LE neurovascularly intact.  Skin: Skin is dry.  Psychiatric: Her behavior is normal.    Assessment/Plan: Left Femoral Neck fracture. Plan for Left Hip cannulated pinning tomorrow afternoon. NPO after MN. Non Weightbearing to Left LE. Continue current care.  Danyka Merlin L 01/18/2013, 10:47 PM

## 2013-01-18 NOTE — Progress Notes (Signed)
Pt stas decreased to 80's on venti at 28%.  Rt placed pt on nrb for 30 minutes and sats returned to 100%.. Rt revisited and removed nrb and put pt back on venti at 50% to titrate o2.  MD ordered cpap, but rn wants tio wean o2 and try to avoid cpap at this time.  Rt will bring cpap if pt becomes in distress at anytime.

## 2013-01-18 NOTE — H&P (Signed)
[facility] History and Physical  Lisa Klein:[id number] DOB: [birth year] DOA: 01/18/2013  Referring physician: Patient was transferred from [facility] for further management of left hip fracture. PCP: [name], MD  Specialists: Oncologist for breast cancer.  Chief Complaint: Left hip pain.  HPI: Lisa Klein is a 79 y.o. female with known history of PE and DVT on xarelto, breast cancer followed by an oncologist, hypothyroidism, hypertension had a fall last night at her house. Patient states that she slipped and fell and did not lose consciousness. Since then left hip has been hurting and did not improve and she came to the ER and x-ray show fracture. On-call orthopedic surgeon was consulted by the ER physician and patient has been transferred to [facility] for further management. Patient denies any chest pain or shortness of breath. Denies any nausea vomiting. Patient did feel mildly dizzy after the fall presently asymptomatic.  Review of Systems: As presented in the history of presenting illness, rest negative.  Past Medical History  Diagnosis Date  . DJD (degenerative joint disease)   . Hypertension   . Hypothyroidism   . Obesity   . Degenerative disc disease     with nerve compression   . Hypothyroidism   . Hypertension   . Clotting disorder 05/2011    dvt, right leg  . Cancer 2012    LEFT BREAST   Past Surgical History  Procedure Laterality Date  . Vesicovaginal fistula closure w/ tah      [facility]  . Cholecystectomy  80'S    [facility]  . Mastectomy modified radical  02/14/11    left  . Portacath placement  04/16/2011    Procedure: INSERTION PORT-A-CATH;  Surgeon: Dalia Heading;  Location: [facility];  Service: General;  Laterality: Right;  right subclavian  . Cholecystectomy    . Breast surgery      left total mastectomy  . Port-a-cath removal N/A 12/05/2012    Procedure: MINOR REMOVAL PORT-A-CATH;  Surgeon: Dalia Heading, MD;  Location: [facility];   Service: General;  Laterality: N/A;  In Minor Room   Social History:  reports that she has never smoked. She has never used smokeless tobacco. She reports that she does not drink alcohol or use illicit drugs. Home. where does patient live-- Can do ADLs. Can patient participate in ADLs?  No Known Allergies  Family History  Problem Relation Age of Onset  . COPD Father   . Lung disease Father   . Hypertension Sister   . Anesthesia problems Neg Hx   . Malignant hyperthermia Neg Hx   . Hypotension Neg Hx   . Pseudochol deficiency Neg Hx       Prior to Admission medications   Medication Sig Start Date End Date Taking? Authorizing Provider  acetaminophen (TYLENOL) 500 MG tablet Take 500 mg by mouth every 6 (six) hours as needed for pain.   Yes Historical Provider, MD  anastrozole (ARIMIDEX) 1 MG tablet Take 1 mg by mouth at bedtime. 11/17/12  Yes Ellouise Newer, PA-C  aspirin EC 81 MG tablet Take 81 mg by mouth daily.   Yes Historical Provider, MD  calcium-vitamin D (OSCAL 500/200 D-3) 500-200 MG-UNIT per tablet Take 1 tablet by mouth 2 (two) times daily.    Yes Historical Provider, MD  docusate sodium (COLACE) 100 MG capsule Take 100 mg by mouth daily as needed for constipation. For constipation   Yes Historical Provider, MD  hydrochlorothiazide (MICROZIDE) 12.5 MG capsule  Take 1 capsule (12.5 mg total) by mouth every morning. 10/31/12  Yes Kerri Perches, MD  levothyroxine (SYNTHROID, LEVOTHROID) 100 MCG tablet Take 1 tablet (100 mcg total) by mouth every morning. 10/31/12  Yes Kerri Perches, MD  lovastatin (MEVACOR) 20 MG tablet Take 1 tablet (20 mg total) by mouth at bedtime. 10/31/12  Yes Kerri Perches, MD  potassium chloride (K-DUR,KLOR-CON) 10 MEQ tablet Take 1 tablet (10 mEq total) by mouth 2 (two) times daily. 10/31/12  Yes Kerri Perches, MD  pyridOXINE (VITAMIN B-6) 100 MG tablet Take 100 mg by mouth daily.   Yes Historical Provider, MD  Rivaroxaban (XARELTO) 20 MG  TABS Take 1 tablet (20 mg total) by mouth daily with supper. 01/06/13  Yes Randall An, MD  acetaminophen (TYLENOL) 650 MG CR tablet Take 650 mg by mouth daily as needed for pain.     Historical Provider, MD   Physical Exam: Filed Vitals:   01/18/13 1436 01/18/13 1855  BP: 131/84 144/60  Pulse: 83 88  Temp: 99.9 F (37.7 C) 99.6 F (37.6 C)  TempSrc: Oral Oral  Resp: 20 18  Height: 5\' 6"  (1.676 m)   Weight: 84.823 kg (187 lb)   SpO2: 98% 97%     General:  Well-developed and nourished.  Eyes: Anicteric no pallor.  ENT: No discharge from the ears eyes nose mouth.  Neck: No mass felt.  Cardiovascular: S1-S2 heard.  Respiratory: No rhonchi or crepitations.  Abdomen: Soft nontender bowel sounds present.  Skin: No rash.  Musculoskeletal: No pain on moving left hip.  Psychiatric: Appears normal.  Neurologic: Alert awake oriented to time place and person. Moves all extremities.  Labs on Admission:  Basic Metabolic Panel:  Recent Labs Lab 01/14/13 1000 01/18/13 1618  NA 142 140  K 4.0 3.6  CL 105 102  CO2 27 29  GLUCOSE 98 94  BUN 12 11  CREATININE 0.92 0.84  CALCIUM 9.4 9.6   Liver Function Tests: No results found for this basename: AST, ALT, ALKPHOS, BILITOT, PROT, ALBUMIN,  in the last 168 hours No results found for this basename: LIPASE, AMYLASE,  in the last 168 hours No results found for this basename: AMMONIA,  in the last 168 hours CBC:  Recent Labs Lab 01/14/13 1000 01/18/13 1618  WBC 3.7* 5.1  NEUTROABS 1.8 3.0  HGB 12.5 11.6*  HCT 38.5 35.6*  MCV 87.3 88.3  PLT 193 146*   Cardiac Enzymes:  Recent Labs Lab 01/18/13 1618  TROPONINI <0.30    BNP (last 3 results) No results found for this basename: PROBNP,  in the last 8760 hours CBG: No results found for this basename: GLUCAP,  in the last 168 hours  Radiological Exams on Admission: Dg Hip Complete Left  01/18/2013   *RADIOLOGY REPORT*  Clinical Data: Pain post fall  LEFT HIP  - COMPLETE 2+ VIEW  Comparison: None.  Findings: Five views of the left hip submitted.  There is mild impacted fracture of the left femoral neck. Multiple pelvic phleboliths.  IMPRESSION: Mild impacted fracture of the left femoral neck.   Original Report Authenticated By: Natasha Mead, M.D.   Dg Chest Port 1 View  01/18/2013   *RADIOLOGY REPORT*  Clinical Data: Fall.  Preop respiratory exam for left hip fracture. Breast cancer.  CHEST - 2 VIEW  Comparison:  10/17/2012  Findings:  The heart size and mediastinal contours are within normal limits.  Both lungs are clear.  The visualized skeletal structures  are unremarkable.Surgical clips again noted in the left axilla.  IMPRESSION: No active cardiopulmonary disease.   Original Report Authenticated By: Myles Rosenthal, M.D.   Dg Knee Complete 4 Views Left  01/18/2013   *RADIOLOGY REPORT*  Clinical Data: Pain post fall  LEFT KNEE - COMPLETE 4+ VIEW  Comparison: None.  Findings: Four views of the left knee submitted.  Diffuse narrowing of the joint space.  Narrowing of patellofemoral joint space.  No acute fracture or subluxation.  IMPRESSION: No acute fracture or subluxation.  Degenerative changes as described above.   Original Report Authenticated By: Natasha Mead, M.D.    EKG: Independently reviewed. Normal sinus rhythm.  Assessment/Plan Principal Problem:   Hip fracture, left Active Problems:   HYPOTHYROIDISM   HYPERTENSION   Infiltrating ductal carcinoma of breast   Pulmonary embolism   1. Left hip fracture - patient will be taken by mouth past midnight for possible surgery. Patient is on xarelto and has been more than 24 hours since patient had her last dose. I have started patient on heparin which has to be stopped at least 4 hours prior to surgery and restarted once okay with orthopedic surgery. Further recommendations per surgery. Continue pain medications. 2. History of PE and DVT on xarelto - see #1. 3. Hypertension - continue home medications with  when necessary IV hydralazine for systolic blood pressure more than 160. 4. Hypothyroidism - continue home medications. 5. History of breast cancer - per oncologist.    Code Status: Full code.  Family Communication: None.  Disposition Plan: Admit to inpatient.    Caide Campi N. [facility] Pager [phone].  If 7PM-7AM, please contact night-coverage [email or web address] Password [facility] 01/18/2013, 9:01 PM

## 2013-01-18 NOTE — ED Provider Notes (Signed)
History     CSN: [id number]  Arrival date & time 01/18/13  1419   First MD Initiated Contact with Patient 01/18/13 1502      Chief Complaint  Patient presents with  . Hip Pain  . Fall     HPI Pt was seen at 1510.  Per pt and family, c/o sudden onset and persistence of constant left hip "pain" that began yesterday. Pt states she slipped and fell at home, landed on her left hip on a hardwood floor. Pt states she was able stand up on her own, but that her left hip "hurts" when she walks or tries to move it. LD xarelto last night. Denies hitting head, no LOC/AMS, no syncope, no prodromal symptoms before fall, no CP/SOB, no abd pain, no N/V/D, no neck or back pain, no focal motor weakness, no tingling/numbness in extremities.     Past Medical History  Diagnosis Date  . DJD (degenerative joint disease)   . Hypertension   . Hypothyroidism   . Obesity   . Degenerative disc disease     with nerve compression   . Hypothyroidism   . Hypertension   . Clotting disorder 05/2011    dvt, right leg  . Cancer 2012    LEFT BREAST    Past Surgical History  Procedure Laterality Date  . Vesicovaginal fistula closure w/ tah      [facility]  . Cholecystectomy  80'S    [facility]  . Mastectomy modified radical  02/14/11    left  . Portacath placement  04/16/2011    Procedure: INSERTION PORT-A-CATH;  Surgeon: Dalia Heading;  Location: [facility];  Service: General;  Laterality: Right;  right subclavian  . Cholecystectomy    . Breast surgery      left total mastectomy  . Port-a-cath removal N/A 12/05/2012    Procedure: MINOR REMOVAL PORT-A-CATH;  Surgeon: Dalia Heading, MD;  Location: [facility];  Service: General;  Laterality: N/A;  In Minor Room    Family History  Problem Relation Age of Onset  . COPD Father   . Lung disease Father   . Hypertension Sister   . Anesthesia problems Neg Hx   . Malignant hyperthermia Neg Hx   . Hypotension Neg Hx   . Pseudochol deficiency Neg Hx     History  Substance Use  Topics  . Smoking status: Never Smoker   . Smokeless tobacco: Never Used  . Alcohol Use: No      Review of Systems ROS: Statement: All systems negative except as marked or noted in the HPI; Constitutional: Negative for fever and chills. ; ; Eyes: Negative for eye pain, redness and discharge. ; ; ENMT: Negative for ear pain, hoarseness, nasal congestion, sinus pressure and sore throat. ; ; Cardiovascular: Negative for chest pain, palpitations, diaphoresis, dyspnea and peripheral edema. ; ; Respiratory: Negative for cough, wheezing and stridor. ; ; Gastrointestinal: Negative for nausea, vomiting, diarrhea, abdominal pain, blood in stool, hematemesis, jaundice and rectal bleeding. . ; ; Genitourinary: Negative for dysuria, flank pain and hematuria. ; ; Musculoskeletal: +left hip pain. Negative for back pain and neck pain. Negative for swelling.; ; Skin: Negative for pruritus, rash, abrasions, blisters, bruising and skin lesion.; ; Neuro: Negative for headache, lightheadedness and neck stiffness. Negative for weakness, altered level of consciousness , altered mental status, extremity weakness, paresthesias, involuntary movement, seizure and syncope.       Allergies  Review of patient's allergies indicates no known allergies.  Home Medications  Current Outpatient Rx  Name  Route  Sig  Dispense  Refill  . acetaminophen (TYLENOL) 500 MG tablet   Oral   Take 500 mg by mouth every 6 (six) hours as needed for pain.         [name] anastrozole (ARIMIDEX) 1 MG tablet   Oral   Take 1 mg by mouth at bedtime.         [name] aspirin EC 81 MG tablet   Oral   Take 81 mg by mouth daily.         . calcium-vitamin D (OSCAL 500/200 D-3) 500-200 MG-UNIT per tablet   Oral   Take 1 tablet by mouth 2 (two) times daily.          [name] docusate sodium (COLACE) 100 MG capsule   Oral   Take 100 mg by mouth daily as needed for constipation. For constipation         . hydrochlorothiazide (MICROZIDE) 12.5 MG  capsule   Oral   Take 1 capsule (12.5 mg total) by mouth every morning.   90 capsule   1   . levothyroxine (SYNTHROID, LEVOTHROID) 100 MCG tablet   Oral   Take 1 tablet (100 mcg total) by mouth every morning.   90 tablet   1   . lovastatin (MEVACOR) 20 MG tablet   Oral   Take 1 tablet (20 mg total) by mouth at bedtime.   90 tablet   1   . potassium chloride (K-DUR,KLOR-CON) 10 MEQ tablet   Oral   Take 1 tablet (10 mEq total) by mouth 2 (two) times daily.   180 tablet   1   . pyridOXINE (VITAMIN B-6) 100 MG tablet   Oral   Take 100 mg by mouth daily.         . Rivaroxaban (XARELTO) 20 MG TABS   Oral   Take 1 tablet (20 mg total) by mouth daily with supper.   30 tablet   3   . acetaminophen (TYLENOL) 650 MG CR tablet   Oral   Take 650 mg by mouth daily as needed for pain.            BP 131/84  Pulse 83  Temp(Src) 99.9 F (37.7 C) (Oral)  Resp 20  Ht 5\' 6"  (1.676 m)  Wt 187 lb (84.823 kg)  BMI 30.2 kg/m2  SpO2 98%  Physical Exam 1515: Physical examination:  Nursing notes reviewed; Vital signs and O2 SAT reviewed;  Constitutional: Well developed, Well nourished, Well hydrated, In no acute distress; Head:  Normocephalic, atraumatic; Eyes: EOMI, PERRL, No scleral icterus; ENMT: Mouth and pharynx normal, Mucous membranes moist; Neck: Supple, Full range of motion, No lymphadenopathy; Cardiovascular: Regular rate and rhythm, No gallop; Respiratory: Breath sounds clear & equal bilaterally, No rales, rhonchi, wheezes.  Speaking full sentences with ease, Normal respiratory effort/excursion; Chest: Nontender, Movement normal; Abdomen: Soft, Nontender, Nondistended, Normal bowel sounds; Genitourinary: No CVA tenderness; Spine:  No midline CS, TS, LS tenderness.;; Extremities: Pulses normal, Pelvis stable. +left hip tenderness to palp, unable to lift extended LLE off stretcher.  NMS intact left foot. NT left knee/ankle/foot. No deformity, no ecchymosis or abrasions. No edema,  No calf edema or asymmetry.; Neuro: AA&Ox3, Major CN grossly intact.  Speech clear. No gross focal motor or sensory deficits in extremities.; Skin: Color normal, Warm, Dry.   ED Course  Procedures     MDM  MDM Reviewed: previous chart, nursing note and vitals Reviewed previous: labs  and ECG Interpretation: labs, ECG and x-ray    Date: 01/18/2013  Rate: 90  Rhythm: normal sinus rhythm  QRS Axis: normal  Intervals: normal  ST/T Wave abnormalities: normal  Conduction Disutrbances:none  Narrative Interpretation:   Old EKG Reviewed: unchanged; TWA anterior leads improved since previous EKG dated 10/20/2012.   Results for orders placed during the hospital encounter of 01/18/13  BASIC METABOLIC PANEL      Result Value Range   Sodium 140  135 - 145 mEq/L   Potassium 3.6  3.5 - 5.1 mEq/L   Chloride 102  96 - 112 mEq/L   CO2 29  19 - 32 mEq/L   Glucose, Bld 94  70 - 99 mg/dL   BUN 11  6 - 23 mg/dL   Creatinine, Ser 8.29  0.50 - 1.10 mg/dL   Calcium 9.6  8.4 - 56.2 mg/dL   GFR calc non Af Amer 64 (*) >90 mL/min   GFR calc Af Amer 74 (*) >90 mL/min  CBC WITH DIFFERENTIAL      Result Value Range   WBC 5.1  4.0 - 10.5 K/uL   RBC 4.03  3.87 - 5.11 MIL/uL   Hemoglobin 11.6 (*) 12.0 - 15.0 g/dL   HCT 13.0 (*) 86.5 - 78.4 %   MCV 88.3  78.0 - 100.0 fL   MCH 28.8  26.0 - 34.0 pg   MCHC 32.6  30.0 - 36.0 g/dL   RDW 69.6  29.5 - 28.4 %   Platelets 146 (*) 150 - 400 K/uL   Neutrophils Relative % 60  43 - 77 %   Neutro Abs 3.0  1.7 - 7.7 K/uL   Lymphocytes Relative 25  12 - 46 %   Lymphs Abs 1.3  0.7 - 4.0 K/uL   Monocytes Relative 12  3 - 12 %   Monocytes Absolute 0.6  0.1 - 1.0 K/uL   Eosinophils Relative 3  0 - 5 %   Eosinophils Absolute 0.2  0.0 - 0.7 K/uL   Basophils Relative 0  0 - 1 %   Basophils Absolute 0.0  0.0 - 0.1 K/uL  TROPONIN I      Result Value Range   Troponin I <0.30  <0.30 ng/mL  PROTIME-INR      Result Value Range   Prothrombin Time 19.2 (*) 11.6 - 15.2  seconds   INR 1.68 (*) 0.00 - 1.49  APTT      Result Value Range   aPTT 39 (*) 24 - 37 seconds   Dg Hip Complete Left 01/18/2013   *RADIOLOGY REPORT*  Clinical Data: Pain post fall  LEFT HIP - COMPLETE 2+ VIEW  Comparison: None.  Findings: Five views of the left hip submitted.  There is mild impacted fracture of the left femoral neck. Multiple pelvic phleboliths.  IMPRESSION: Mild impacted fracture of the left femoral neck.   Original Report Authenticated By: Natasha Mead, M.D.   Dg Chest Port 1 View 01/18/2013   *RADIOLOGY REPORT*  Clinical Data: Fall.  Preop respiratory exam for left hip fracture. Breast cancer.  CHEST - 2 VIEW  Comparison:  10/17/2012  Findings:  The heart size and mediastinal contours are within normal limits.  Both lungs are clear.  The visualized skeletal structures are unremarkable.Surgical clips again noted in the left axilla.  IMPRESSION: No active cardiopulmonary disease.   Original Report Authenticated By: Myles Rosenthal, M.D.   Dg Knee Complete 4 Views Left 01/18/2013   *RADIOLOGY REPORT*  Clinical Data:  Pain post fall  LEFT KNEE - COMPLETE 4+ VIEW  Comparison: None.  Findings: Four views of the left knee submitted.  Diffuse narrowing of the joint space.  Narrowing of patellofemoral joint space.  No acute fracture or subluxation.  IMPRESSION: No acute fracture or subluxation.  Degenerative changes as described above.   Original Report Authenticated By: Natasha Mead, M.D.     1640:  Dx and testing d/w pt and family.  Questions answered.  Verb understanding, agreeable to transfer/admit to [facility] (no Ortho coverage at [facility] this weekend).  T/C to [facility] Dr. Rennis Chris, case discussed, including:  HPI, pertinent PM/SHx, VS/PE, dx testing, ED course and treatment:  Agreeable to consult, requests to admit to [facility].  1715:  T/C to [facility] Dr. Arthor Captain, case discussed, including:  HPI, pertinent PM/SHx, VS/PE, dx testing, ED course and treatment:  Agreeable to admit, requests to write temporary  orders, obtain tele bed to team [id number].           Laray Anger, DO 01/19/13 1301

## 2013-01-18 NOTE — ED Notes (Signed)
States that she fell onto her left hip and started having pain.  States that later on that evening she started having dizziness and felt like she was going to pass out.

## 2013-01-19 ENCOUNTER — Inpatient Hospital Stay (HOSPITAL_COMMUNITY): Payer: Medicare HMO

## 2013-01-19 ENCOUNTER — Encounter (HOSPITAL_COMMUNITY)
Admission: EM | Disposition: A | Discharge status: Skilled Nursing Facility | Payer: Self-pay | Source: Home / Self Care | Attending: Internal Medicine

## 2013-01-19 ENCOUNTER — Encounter (HOSPITAL_COMMUNITY): Payer: Self-pay | Admitting: Anesthesiology

## 2013-01-19 ENCOUNTER — Inpatient Hospital Stay (HOSPITAL_COMMUNITY): Payer: Medicare HMO | Admitting: Certified Registered"

## 2013-01-19 ENCOUNTER — Encounter (HOSPITAL_COMMUNITY): Payer: Self-pay | Admitting: Certified Registered"

## 2013-01-19 DIAGNOSIS — I10 Essential (primary) hypertension: Secondary | ICD-10-CM

## 2013-01-19 DIAGNOSIS — E039 Hypothyroidism, unspecified: Secondary | ICD-10-CM

## 2013-01-19 DIAGNOSIS — C50919 Malignant neoplasm of unspecified site of unspecified female breast: Secondary | ICD-10-CM

## 2013-01-19 HISTORY — PX: HIP PINNING,CANNULATED: SHX1758

## 2013-01-19 LAB — CBC
Hemoglobin: 11.8 g/dL — ABNORMAL LOW (ref 12.0–15.0)
MCH: 29 pg (ref 26.0–34.0)
MCV: 89.2 fL (ref 78.0–100.0)
RBC: 4.07 MIL/uL (ref 3.87–5.11)

## 2013-01-19 LAB — COMPREHENSIVE METABOLIC PANEL
AST: 29 U/L (ref 0–37)
Albumin: 2.8 g/dL — ABNORMAL LOW (ref 3.5–5.2)
Alkaline Phosphatase: 60 U/L (ref 39–117)
Chloride: 105 mEq/L (ref 96–112)
Creatinine, Ser: 0.76 mg/dL (ref 0.50–1.10)
Potassium: 3.3 mEq/L — ABNORMAL LOW (ref 3.5–5.1)
Total Bilirubin: 0.8 mg/dL (ref 0.3–1.2)
Total Protein: 5.9 g/dL — ABNORMAL LOW (ref 6.0–8.3)

## 2013-01-19 LAB — CBC WITH DIFFERENTIAL/PLATELET
Eosinophils Absolute: 0.2 10*3/uL (ref 0.0–0.7)
Eosinophils Relative: 4 % (ref 0–5)
HCT: 33.2 % — ABNORMAL LOW (ref 36.0–46.0)
Lymphs Abs: 1 10*3/uL (ref 0.7–4.0)
MCH: 28.6 pg (ref 26.0–34.0)
MCV: 87.8 fL (ref 78.0–100.0)
Monocytes Absolute: 0.6 10*3/uL (ref 0.1–1.0)
Platelets: 127 10*3/uL — ABNORMAL LOW (ref 150–400)
RBC: 3.78 MIL/uL — ABNORMAL LOW (ref 3.87–5.11)

## 2013-01-19 LAB — URINALYSIS, ROUTINE W REFLEX MICROSCOPIC
Bilirubin Urine: NEGATIVE
Glucose, UA: NEGATIVE mg/dL
Hgb urine dipstick: NEGATIVE
Ketones, ur: NEGATIVE mg/dL
Nitrite: NEGATIVE
Specific Gravity, Urine: 1.018 (ref 1.005–1.030)
pH: 6 (ref 5.0–8.0)

## 2013-01-19 LAB — CREATININE, SERUM: GFR calc Af Amer: >90 mL/min (ref >90)

## 2013-01-19 LAB — TSH: TSH: 2.273 u[IU]/mL (ref 0.350–4.500)

## 2013-01-19 LAB — SURGICAL PCR SCREEN
MRSA, PCR: NEGATIVE
Staphylococcus aureus: NEGATIVE

## 2013-01-19 LAB — HEPARIN LEVEL (UNFRACTIONATED): Heparin Unfractionated: 1.32 IU/mL — ABNORMAL HIGH (ref 0.30–0.70)

## 2013-01-19 SURGERY — FIXATION, FEMUR, NECK, PERCUTANEOUS, USING SCREW
Anesthesia: General | Site: Leg Upper | Laterality: Left | Wound class: Clean

## 2013-01-19 MED ORDER — PROPOFOL 10 MG/ML IV BOLUS
INTRAVENOUS | Status: DC | PRN
  Administered 2013-01-19: 150 mg via INTRAVENOUS

## 2013-01-19 MED ORDER — HYDROCHLOROTHIAZIDE 12.5 MG PO CAPS
12.5000 mg | ORAL_CAPSULE | Freq: Every morning | ORAL | Status: DC
  Administered 2013-01-19: 12.5 mg via ORAL
  Filled 2013-01-19: qty 1

## 2013-01-19 MED ORDER — ONDANSETRON HCL 4 MG PO TABS: 4.0000 mg | ORAL_TABLET | Freq: Four times a day (QID) | ORAL | Status: DC | PRN

## 2013-01-19 MED ORDER — ROCURONIUM BROMIDE 100 MG/10ML IV SOLN
INTRAVENOUS | Status: DC | PRN
  Administered 2013-01-19: 30 mg via INTRAVENOUS

## 2013-01-19 MED ORDER — MAGNESIUM CITRATE PO SOLN
1.0000 | Freq: Once | ORAL | Status: AC | PRN
  Filled 2013-01-19: qty 296

## 2013-01-19 MED ORDER — ACETAMINOPHEN 650 MG RE SUPP: 650.0000 mg | Freq: Four times a day (QID) | RECTAL | Status: DC | PRN

## 2013-01-19 MED ORDER — POTASSIUM CHLORIDE CRYS ER 10 MEQ PO TBCR
10.0000 meq | EXTENDED_RELEASE_TABLET | Freq: Two times a day (BID) | ORAL | Status: DC
  Administered 2013-01-19: 10 meq via ORAL
  Filled 2013-01-19 (×3): qty 1

## 2013-01-19 MED ORDER — HYDROMORPHONE HCL PF 1 MG/ML IJ SOLN
0.2500 mg | INTRAMUSCULAR | Status: DC | PRN
  Administered 2013-01-19 – 2013-01-20 (×2): 0.5 mg via INTRAVENOUS
  Filled 2013-01-19: qty 1

## 2013-01-19 MED ORDER — GLYCOPYRROLATE 0.2 MG/ML IJ SOLN
INTRAMUSCULAR | Status: DC | PRN
  Administered 2013-01-19: 0.4 mg via INTRAVENOUS

## 2013-01-19 MED ORDER — ONDANSETRON HCL 4 MG/2ML IJ SOLN
INTRAMUSCULAR | Status: DC | PRN
  Administered 2013-01-19: 4 mg via INTRAVENOUS

## 2013-01-19 MED ORDER — MENTHOL 3 MG MT LOZG
1.0000 | LOZENGE | OROMUCOSAL | Status: DC | PRN
Start: 2013-01-19 — End: 2013-01-22

## 2013-01-19 MED ORDER — 0.9 % SODIUM CHLORIDE (POUR BTL) OPTIME
TOPICAL | Status: DC | PRN
  Administered 2013-01-19: 1000 mL

## 2013-01-19 MED ORDER — CEFAZOLIN SODIUM-DEXTROSE 2-3 GM-% IV SOLR
INTRAVENOUS | Status: AC
  Administered 2013-01-19: 2 g via INTRAVENOUS
  Filled 2013-01-19: qty 50

## 2013-01-19 MED ORDER — RIVAROXABAN 20 MG PO TABS
20.0000 mg | ORAL_TABLET | Freq: Every day | ORAL | Status: DC
  Administered 2013-01-19 – 2013-01-21 (×3): 20 mg via ORAL
  Filled 2013-01-19 (×3): qty 1

## 2013-01-19 MED ORDER — ACETAMINOPHEN 325 MG PO TABS: 650.0000 mg | ORAL_TABLET | Freq: Four times a day (QID) | ORAL | Status: DC | PRN

## 2013-01-19 MED ORDER — RIVAROXABAN 20 MG PO TABS
20.0000 mg | ORAL_TABLET | Freq: Every day | ORAL | Status: DC
  Filled 2013-01-19: qty 1

## 2013-01-19 MED ORDER — ENOXAPARIN SODIUM 40 MG/0.4ML ~~LOC~~ SOLN: 40.0000 mg | SUBCUTANEOUS | Status: DC

## 2013-01-19 MED ORDER — FENTANYL CITRATE 0.05 MG/ML IJ SOLN
INTRAMUSCULAR | Status: DC | PRN
  Administered 2013-01-19: 50 ug via INTRAVENOUS

## 2013-01-19 MED ORDER — ONDANSETRON HCL 4 MG/2ML IJ SOLN: 4.0000 mg | Freq: Four times a day (QID) | INTRAMUSCULAR | Status: DC | PRN

## 2013-01-19 MED ORDER — NEOSTIGMINE METHYLSULFATE 1 MG/ML IJ SOLN
INTRAMUSCULAR | Status: DC | PRN
  Administered 2013-01-19: 3 mg via INTRAVENOUS

## 2013-01-19 MED ORDER — PHENOL 1.4 % MT LIQD
1.0000 | OROMUCOSAL | Status: DC | PRN
  Administered 2013-01-19: 1 via OROMUCOSAL
  Filled 2013-01-19: qty 177

## 2013-01-19 MED ORDER — ONDANSETRON HCL 4 MG/2ML IJ SOLN: 4.0000 mg | Freq: Once | INTRAMUSCULAR | Status: AC | PRN

## 2013-01-19 MED ORDER — ONDANSETRON HCL 4 MG/2ML IJ SOLN
INTRAMUSCULAR | Status: AC
  Administered 2013-01-19: 4 mg via INTRAVENOUS
  Filled 2013-01-19: qty 2

## 2013-01-19 MED ORDER — METHOCARBAMOL 100 MG/ML IJ SOLN
500.0000 mg | Freq: Four times a day (QID) | INTRAVENOUS | Status: DC | PRN
  Filled 2013-01-19: qty 5

## 2013-01-19 MED ORDER — WHITE PETROLATUM GEL
Status: AC
  Administered 2013-01-19: 0.2
  Filled 2013-01-19: qty 5

## 2013-01-19 MED ORDER — DEXTROSE-NACL 5-0.45 % IV SOLN
INTRAVENOUS | Status: DC
  Administered 2013-01-19: 19:00:00 via INTRAVENOUS

## 2013-01-19 MED ORDER — LIDOCAINE HCL (CARDIAC) 20 MG/ML IV SOLN
INTRAVENOUS | Status: DC | PRN
  Administered 2013-01-19: 20 mg via INTRAVENOUS

## 2013-01-19 MED ORDER — DOCUSATE SODIUM 100 MG PO CAPS
100.0000 mg | ORAL_CAPSULE | Freq: Two times a day (BID) | ORAL | Status: DC
  Administered 2013-01-19 – 2013-01-21 (×4): 100 mg via ORAL
  Filled 2013-01-19 (×5): qty 1

## 2013-01-19 MED ORDER — HYDROMORPHONE HCL PF 1 MG/ML IJ SOLN
INTRAMUSCULAR | Status: AC
  Filled 2013-01-19: qty 1

## 2013-01-19 MED ORDER — METOCLOPRAMIDE HCL 10 MG PO TABS: 5.0000 mg | ORAL_TABLET | Freq: Three times a day (TID) | ORAL | Status: DC | PRN

## 2013-01-19 MED ORDER — METHOCARBAMOL 500 MG PO TABS
500.0000 mg | ORAL_TABLET | Freq: Four times a day (QID) | ORAL | Status: DC | PRN
  Administered 2013-01-20 (×2): 500 mg via ORAL
  Filled 2013-01-19 (×2): qty 1

## 2013-01-19 MED ORDER — LACTATED RINGERS IV SOLN
INTRAVENOUS | Status: DC
  Administered 2013-01-19: 15:00:00 via INTRAVENOUS

## 2013-01-19 MED ORDER — METOCLOPRAMIDE HCL 5 MG/ML IJ SOLN: 5.0000 mg | Freq: Three times a day (TID) | INTRAMUSCULAR | Status: DC | PRN

## 2013-01-19 SURGICAL SUPPLY — 40 items
BLADE SURG 15 STRL LF DISP TIS (BLADE) ×1 IMPLANT
BLADE SURG 15 STRL SS (BLADE) ×1
CLOTH BEACON ORANGE TIMEOUT ST (SAFETY) ×2 IMPLANT
COVER SURGICAL LIGHT HANDLE (MISCELLANEOUS) ×2 IMPLANT
DRAPE STERI IOBAN 125X83 (DRAPES) ×2 IMPLANT
DRSG ADAPTIC 3X8 NADH LF (GAUZE/BANDAGES/DRESSINGS) IMPLANT
DRSG MEPILEX BORDER 4X4 (GAUZE/BANDAGES/DRESSINGS) ×2 IMPLANT
DURAPREP 26ML APPLICATOR (WOUND CARE) ×2 IMPLANT
ELECT REM PT RETURN 9FT ADLT (ELECTROSURGICAL) ×2
ELECTRODE REM PT RTRN 9FT ADLT (ELECTROSURGICAL) ×1 IMPLANT
FACESHIELD LNG OPTICON STERILE (SAFETY) IMPLANT
GLOVE BIO SURGEON STRL SZ7 (GLOVE) ×4 IMPLANT
GLOVE BIO SURGEON STRL SZ7.5 (GLOVE) IMPLANT
GLOVE BIO SURGEON STRL SZ8 (GLOVE) ×2 IMPLANT
GLOVE BIOGEL PI IND STRL 7.0 (GLOVE) ×2 IMPLANT
GLOVE BIOGEL PI INDICATOR 7.0 (GLOVE) ×2
GLOVE EUDERMIC 7 POWDERFREE (GLOVE) IMPLANT
GLOVE SS BIOGEL STRL SZ 7.5 (GLOVE) ×1 IMPLANT
GLOVE SUPERSENSE BIOGEL SZ 7.5 (GLOVE) ×1
GOWN STRL NON-REIN LRG LVL3 (GOWN DISPOSABLE) ×2 IMPLANT
GOWN STRL REIN XL XLG (GOWN DISPOSABLE) ×4 IMPLANT
GUIDEWIRE THREADED 2.8 (WIRE) ×8 IMPLANT
KIT BASIN OR (CUSTOM PROCEDURE TRAY) ×2 IMPLANT
KIT ROOM TURNOVER OR (KITS) ×2 IMPLANT
MANIFOLD NEPTUNE II (INSTRUMENTS) IMPLANT
NS IRRIG 1000ML POUR BTL (IV SOLUTION) ×2 IMPLANT
PACK GENERAL/GYN (CUSTOM PROCEDURE TRAY) ×2 IMPLANT
PAD ARMBOARD 7.5X6 YLW CONV (MISCELLANEOUS) ×6 IMPLANT
SCREW CANN 16 THRD/100 7.3 (Screw) ×4 IMPLANT
SCREW CANN 16 THRD/105 7.3 (Screw) ×2 IMPLANT
SCREW CANN 16 THRD/110 7.3 (Screw) ×2 IMPLANT
STAPLER VISISTAT 35W (STAPLE) ×2 IMPLANT
STRIP CLOSURE SKIN 1/2X4 (GAUZE/BANDAGES/DRESSINGS) ×2 IMPLANT
SUT VIC AB 0 CT1 27 (SUTURE) ×1
SUT VIC AB 0 CT1 27XBRD ANBCTR (SUTURE) ×1 IMPLANT
SUT VIC AB 2-0 CT1 27 (SUTURE) ×1
SUT VIC AB 2-0 CT1 TAPERPNT 27 (SUTURE) ×1 IMPLANT
TOWEL OR 17X24 6PK STRL BLUE (TOWEL DISPOSABLE) ×2 IMPLANT
TOWEL OR 17X26 10 PK STRL BLUE (TOWEL DISPOSABLE) ×2 IMPLANT
WATER STERILE IRR 1000ML POUR (IV SOLUTION) IMPLANT

## 2013-01-19 NOTE — Anesthesia Preprocedure Evaluation (Addendum)
Anesthesia Evaluation  Patient identified by MRN, date of birth, ID band Patient awake    Reviewed: Allergy & Precautions, H&P , NPO status , Patient's Chart, lab work & pertinent test results  Airway       Dental   Pulmonary          Cardiovascular hypertension,     Neuro/Psych    GI/Hepatic   Endo/Other  Hypothyroidism   Renal/GU      Musculoskeletal   Abdominal   Peds  Hematology   Anesthesia Other Findings   Reproductive/Obstetrics                           Anesthesia Physical Anesthesia Plan  ASA: II  Anesthesia Plan: General   Post-op Pain Management:    Induction: Intravenous  Airway Management Planned: Oral ETT  Additional Equipment:   Intra-op Plan:   Post-operative Plan: Extubation in OR  Informed Consent: I have reviewed the patients History and Physical, chart, labs and discussed the procedure including the risks, benefits and alternatives for the proposed anesthesia with the patient or authorized representative who has indicated his/her understanding and acceptance.     Plan Discussed with: CRNA, Anesthesiologist and Surgeon  Anesthesia Plan Comments:         Anesthesia Quick Evaluation

## 2013-01-19 NOTE — Progress Notes (Signed)
I have discussed with Lisa Klein treatment options and risks vs benefits there-of. She understands and accepts and agrees with plan for left hip pinning

## 2013-01-19 NOTE — Progress Notes (Signed)
ANTICOAGULATION CONSULT NOTE - Follow Up Consult  Pharmacy Consult for Heparin Indication: h/o DVT and PE  No Known Allergies  Patient Measurements: Height: 5\' 5"  (165.1 cm) Weight: 184 lb (83.462 kg) IBW/kg (Calculated) : 57 Heparin Dosing Weight:    Vital Signs: Temp: 97.8 F (36.6 C) (06/16 0511) BP: 107/51 mmHg (06/16 0511) Pulse Rate: 64 (06/16 0511)  Labs:  Recent Labs  01/18/13 1618 01/18/13 2325 01/19/13 0805  HGB 11.6*  --  10.8*  HCT 35.6*  --  33.2*  PLT 146*  --  127*  APTT 39* 36 168*  LABPROT 19.2*  --   --   INR 1.68*  --   --   HEPARINUNFRC  --   --  1.32*  CREATININE 0.84  --  0.76  TROPONINI <0.30  --   --     Estimated Creatinine Clearance: 59.9 ml/min (by C-G formula based on Cr of 0.76).   Assessment: L hip fx. 79 yo female on Xarelto PTA for history of DVT and noted here with L hip fx.  Anticoag:  Last dose of Xarelto (h/o DVT and PE)was 6/14 with supper. Stop heparin now for pending hip surgery today. Can restart when ok with orthopedic surgery. Heparin level this am elevated at 1.32 with ptt 168 (drawn from line?)  Goal of Therapy:  Heparin level 0.3-0.7 units/ml aptt 66-102 Monitor platelets by anticoagulation protocol: Yes   Plan:  -Per consult order- Have to stop Heparin 4 hours before surgery--now.    Kursten Kruk S. Merilynn Finland, PharmD, BCPS Clinical Staff Pharmacist Pager [phone]  Misty Stanley Stillinger 01/19/2013,10:59 AM

## 2013-01-19 NOTE — Preoperative (Signed)
Beta Blockers   Reason not to administer Beta Blockers:Not Applicable 

## 2013-01-19 NOTE — Progress Notes (Signed)
[facility] PROGRESS NOTE  MARCEDES TECH ZOX:[id number] DOB: [birth year] DOA: 01/18/2013 PCP: [name], MD  Assessment/Plan: Left hip fracture  - started on heparin which has to be stopped at least 4 hours prior to surgery and restarted once okay with orthopedic surgery -Pending L hip surgery today -Continue pain medications History of PE and DVT - on xarelto, currenly on heparin - see #1. Hypertension  - continue home medications with when necessary IV hydralazine for systolic pressure more than 160.  Hypothyroidism  - continue home medications.  History of breast cancer  - per oncologist.  Code Status: Full Family Communication: Pending (indicate person spoken with, relationship, and if by phone, the number) Disposition Plan: Pending   Consultants:  Ortho  HPI/Subjective: No complaints  Objective: Filed Vitals:   01/18/13 1855 01/18/13 2100 01/18/13 2236 01/19/13 0511  BP: 144/60  131/52 107/51  Pulse: 88  74 64  Temp: 99.6 F (37.6 C)  98.2 F (36.8 C) 97.8 F (36.6 C)  TempSrc: Oral     Resp: 18  16 16   Height:  5\' 5"  (1.651 m)    Weight:  83.462 kg (184 lb)    SpO2: 97%  99% 95%    Intake/Output Summary (Last 24 hours) at 01/19/13 0834 Last data filed at 01/19/13 0600  Gross per 24 hour  Intake 1291.25 ml  Output    525 ml  Net 766.25 ml   Filed Weights   01/18/13 1436 01/18/13 2100  Weight: 84.823 kg (187 lb) 83.462 kg (184 lb)    Exam:   General:  Awake, in nad  Cardiovascular: regular, s1, s2  Respiratory: normal resp effort, no wheezing or crackles  Abdomen: soft, nondistended  Musculoskeletal: perfused, no clubbing   Data Reviewed: Basic Metabolic Panel:  Recent Labs Lab 01/14/13 1000 01/18/13 1618  NA 142 140  K 4.0 3.6  CL 105 102  CO2 27 29  GLUCOSE 98 94  BUN 12 11  CREATININE 0.92 0.84  CALCIUM 9.4 9.6   Liver Function Tests: No results found for this basename: AST, ALT, ALKPHOS, BILITOT, PROT,  ALBUMIN,  in the last 168 hours No results found for this basename: LIPASE, AMYLASE,  in the last 168 hours No results found for this basename: AMMONIA,  in the last 168 hours CBC:  Recent Labs Lab 01/14/13 1000 01/18/13 1618 01/19/13 0805  WBC 3.7* 5.1 4.1  NEUTROABS 1.8 3.0 2.3  HGB 12.5 11.6* 10.8*  HCT 38.5 35.6* 33.2*  MCV 87.3 88.3 87.8  PLT 193 146* 127*   Cardiac Enzymes:  Recent Labs Lab 01/18/13 1618  TROPONINI <0.30   BNP (last 3 results) No results found for this basename: PROBNP,  in the last 8760 hours CBG: No results found for this basename: GLUCAP,  in the last 168 hours  Recent Results (from the past 240 hour(s))  SURGICAL PCR SCREEN     Status: None   Collection Time    01/19/13  5:12 AM      Result Value Range Status   MRSA, PCR NEGATIVE  NEGATIVE Final   Staphylococcus aureus NEGATIVE  NEGATIVE Final   Comment:            The Xpert SA Assay (FDA     approved for NASAL specimens     in patients over 44 years of age),     is one component of     a comprehensive surveillance     program.  Test performance has  been validated by [facility] for patients greater     than or equal to 51 year old.     It is not intended     to diagnose infection nor to     guide or monitor treatment.     Studies: Dg Hip Complete Left  01/18/2013   *RADIOLOGY REPORT*  Clinical Data: Pain post fall  LEFT HIP - COMPLETE 2+ VIEW  Comparison: None.  Findings: Five views of the left hip submitted.  There is mild impacted fracture of the left femoral neck. Multiple pelvic phleboliths.  IMPRESSION: Mild impacted fracture of the left femoral neck.   Original Report Authenticated By: Natasha Mead, M.D.   Dg Chest Port 1 View  01/18/2013   *RADIOLOGY REPORT*  Clinical Data: Fall.  Preop respiratory exam for left hip fracture. Breast cancer.  CHEST - 2 VIEW  Comparison:  10/17/2012  Findings:  The heart size and mediastinal contours are within normal limits.  Both lungs  are clear.  The visualized skeletal structures are unremarkable.Surgical clips again noted in the left axilla.  IMPRESSION: No active cardiopulmonary disease.   Original Report Authenticated By: Myles Rosenthal, M.D.   Dg Knee Complete 4 Views Left  01/18/2013   *RADIOLOGY REPORT*  Clinical Data: Pain post fall  LEFT KNEE - COMPLETE 4+ VIEW  Comparison: None.  Findings: Four views of the left knee submitted.  Diffuse narrowing of the joint space.  Narrowing of patellofemoral joint space.  No acute fracture or subluxation.  IMPRESSION: No acute fracture or subluxation.  Degenerative changes as described above.   Original Report Authenticated By: Natasha Mead, M.D.    Scheduled Meds: . anastrozole  1 mg Oral QHS  . aspirin EC  81 mg Oral Daily  . calcium-vitamin D  1 tablet Oral BID  . levothyroxine  100 mcg Oral QAC breakfast  . pyridOXINE  100 mg Oral Daily  . simvastatin  10 mg Oral q1800   Continuous Infusions: . sodium chloride 75 mL/hr at 01/19/13 0600  . heparin 1,100 Units/hr (01/18/13 2150)    Principal Problem:   Hip fracture, left Active Problems:   HYPOTHYROIDISM   HYPERTENSION   Infiltrating ductal carcinoma of breast   Pulmonary embolism    Time spent:    Jibril Mcminn K  [facility] Pager [phone]. If 7PM-7AM, please contact night-coverage at [email or web address], password [facility] 01/19/2013, 8:34 AM  LOS: 1 day

## 2013-01-19 NOTE — Progress Notes (Signed)
UR COMPLETED  

## 2013-01-19 NOTE — Transfer of Care (Signed)
Immediate Anesthesia Transfer of Care Note  Patient: Lisa Klein  Procedure(s) Performed: Procedure(s): CANNULATED HIP PINNING-  left (Left)  Patient Location: PACU  Anesthesia Type:General  Level of Consciousness: responds to stimulation  Airway & Oxygen Therapy: Patient Spontanous Breathing and Patient connected to face mask oxygen  Post-op Assessment: Report given to PACU RN, Post -op Vital signs reviewed and stable and Patient moving all extremities  Post vital signs: Reviewed and stable  Complications: No apparent anesthesia complications

## 2013-01-19 NOTE — Op Note (Signed)
01/18/2013 - 01/19/2013  5:22 PM  PATIENT:   Lisa Klein  79 y.o. female  PRE-OPERATIVE DIAGNOSIS:  Left Femoral Neck Fracture  POST-OPERATIVE DIAGNOSIS:  same  PROCEDURE:  Multiple cannulated screw fixation  SURGEON:  Amarie Tarte, Vania Rea M.D.  ASSISTANTS: none   ANESTHESIA:   GET  EBL: min  SPECIMEN:  none  Drains: none   PATIENT DISPOSITION:  PACU - hemodynamically stable.    PLAN OF CARE: Admit to inpatient   Dictation# [id number]

## 2013-01-19 NOTE — Progress Notes (Signed)
INITIAL NUTRITION ASSESSMENT  DOCUMENTATION CODES Per approved criteria  -Obesity Unspecified   INTERVENTION: 1.  Modify diet; resume of PO diet once medically appropriate 2.  Supplements; Resource Breeze BID  NUTRITION DIAGNOSIS: Increased nutrient needs related to healing as evidenced by hip fx.   Monitor:  1.  Food/Beverage; resume of PO diet post-op with tolerance.  Pt meeting >/=90% estimated needs  Reason for Assessment: consult  79 y.o. female  Admitting Dx: Hip fracture, left  ASSESSMENT: Pt admitted with hip fx.  RD met with pt at bedside to discuss nutrition status.  Pt is a poor historian.  Per interview with pt, it appears that her intake is highly variable.  Pt chooses high-calorie foods, and often snacks.  She typically consumes 2 meals/day with a possible late night snack. She like to "eat out" but is not able to describe how gets take out or goes to restaurants.  Her family is local to [place], however she lives in [place].  She denies recent wt loss and per chart review, wt appears to be stable.  Pt reports she drinks 1 supplement per week and has not been able to drink substantial amounts of Ensure since she completed chemo.  Discussed available products.  Pt willing to try Breeze when diet advanced.  Height: Ht Readings from Last 1 Encounters:  01/18/13 5\' 5"  (1.651 m)    Weight: Wt Readings from Last 1 Encounters:  01/18/13 184 lb (83.462 kg)    Ideal Body Weight: 125 lbs  % Ideal Body Weight: 147%  Wt Readings from Last 10 Encounters:  01/18/13 184 lb (83.462 kg)  01/18/13 184 lb (83.462 kg)  01/05/13 186 lb 3.2 oz (84.46 kg)  11/17/12 188 lb (85.276 kg)  10/31/12 183 lb (83.008 kg)  10/19/12 190 lb 0.6 oz (86.2 kg)  10/03/12 187 lb (84.823 kg)  07/17/12 194 lb (87.998 kg)  04/03/12 184 lb 3.2 oz (83.553 kg)  02/26/12 183 lb 0.6 oz (83.026 kg)    Usual Body Weight: 185 lbs  % Usual Body Weight: 100%  BMI:  Body mass index is 30.62  kg/(m^2).  Estimated Nutritional Needs: Kcal: 1590-1710 Protein: 68-79g Fluid: ~1.8 L/day  Skin: intact  Diet Order: NPO  EDUCATION NEEDS: -Education needs addressed   Intake/Output Summary (Last 24 hours) at 01/19/13 1457 Last data filed at 01/19/13 1258  Gross per 24 hour  Intake 1291.25 ml  Output    825 ml  Net 466.25 ml    Last BM: 6/15  Labs:   Recent Labs Lab 01/14/13 1000 01/18/13 1618 01/19/13 0805  NA 142 140 141  K 4.0 3.6 3.3*  CL 105 102 105  CO2 27 29 28   BUN 12 11 9   CREATININE 0.92 0.84 0.76  CALCIUM 9.4 9.6 8.4  GLUCOSE 98 94 93    CBG (last 3)  No results found for this basename: GLUCAP,  in the last 72 hours  Scheduled Meds: . anastrozole  1 mg Oral QHS  . aspirin EC  81 mg Oral Daily  . calcium-vitamin D  1 tablet Oral BID  . hydrochlorothiazide  12.5 mg Oral q morning - 10a  . levothyroxine  100 mcg Oral QAC breakfast  . potassium chloride  10 mEq Oral BID WC  . pyridOXINE  100 mg Oral Daily  . simvastatin  10 mg Oral q1800    Continuous Infusions: . sodium chloride 75 mL/hr at 01/19/13 0600    Past Medical History  Diagnosis Date  .  DJD (degenerative joint disease)   . Hypertension   . Hypothyroidism   . Obesity   . Degenerative disc disease     with nerve compression   . Hypothyroidism   . Hypertension   . Clotting disorder 05/2011    dvt, right leg  . Cancer 2012    LEFT BREAST    Past Surgical History  Procedure Laterality Date  . Vesicovaginal fistula closure w/ tah      [facility]  . Cholecystectomy  80'S    [facility]  . Mastectomy modified radical  02/14/11    left  . Portacath placement  04/16/2011    Procedure: INSERTION PORT-A-CATH;  Surgeon: Dalia Heading;  Location: [facility];  Service: General;  Laterality: Right;  right subclavian  . Cholecystectomy    . Breast surgery      left total mastectomy  . Port-a-cath removal N/A 12/05/2012    Procedure: MINOR REMOVAL PORT-A-CATH;  Surgeon: Dalia Heading, MD;   Location: [facility];  Service: General;  Laterality: N/A;  In Minor Room    Loyce Dys, MS RD LDN Clinical Inpatient Dietitian Pager: [phone] Weekend/After hours pager: [phone]

## 2013-01-19 NOTE — Anesthesia Procedure Notes (Signed)
Procedure Name: Intubation Date/Time: 01/19/2013 4:04 PM Performed by: Jerilee Hoh Pre-anesthesia Checklist: Patient identified, Emergency Drugs available, Suction available and Patient being monitored Patient Re-evaluated:Patient Re-evaluated prior to inductionOxygen Delivery Method: Circle system utilized Preoxygenation: Pre-oxygenation with 100% oxygen Intubation Type: IV induction Ventilation: Mask ventilation without difficulty Laryngoscope Size: Mac and 4 Grade View: Grade I Tube type: Oral Tube size: 7.0 mm Number of attempts: 1 Airway Equipment and Method: Stylet Placement Confirmation: ETT inserted through vocal cords under direct vision,  positive ETCO2 and breath sounds checked- equal and bilateral Secured at: 21 cm Tube secured with: Tape Dental Injury: Teeth and Oropharynx as per pre-operative assessment

## 2013-01-20 ENCOUNTER — Encounter (HOSPITAL_COMMUNITY): Payer: Self-pay | Admitting: Orthopedic Surgery

## 2013-01-20 DIAGNOSIS — M1990 Unspecified osteoarthritis, unspecified site: Secondary | ICD-10-CM

## 2013-01-20 DIAGNOSIS — I82409 Acute embolism and thrombosis of unspecified deep veins of unspecified lower extremity: Secondary | ICD-10-CM

## 2013-01-20 DIAGNOSIS — I2699 Other pulmonary embolism without acute cor pulmonale: Secondary | ICD-10-CM

## 2013-01-20 LAB — CBC
Platelets: 129 10*3/uL — ABNORMAL LOW (ref 150–400)
RDW: 14.6 % (ref 11.5–15.5)
WBC: 4.7 10*3/uL (ref 4.0–10.5)

## 2013-01-20 LAB — URINE CULTURE
Colony Count: NO GROWTH
Culture: NO GROWTH

## 2013-01-20 LAB — BASIC METABOLIC PANEL
CO2: 28 mEq/L (ref 19–32)
Chloride: 101 mEq/L (ref 96–112)
Glucose, Bld: 118 mg/dL — ABNORMAL HIGH (ref 70–99)
Potassium: 3.7 mEq/L (ref 3.5–5.1)
Sodium: 137 mEq/L (ref 135–145)

## 2013-01-20 LAB — APTT: aPTT: 52 seconds — ABNORMAL HIGH (ref 24–37)

## 2013-01-20 MED ORDER — VITAMIN B-6 100 MG PO TABS
100.0000 mg | ORAL_TABLET | Freq: Every day | ORAL | Status: DC
  Administered 2013-01-20 – 2013-01-21 (×2): 100 mg via ORAL
  Filled 2013-01-20 (×2): qty 1

## 2013-01-20 MED ORDER — HYDROCHLOROTHIAZIDE 12.5 MG PO CAPS
12.5000 mg | ORAL_CAPSULE | Freq: Every day | ORAL | Status: DC
  Administered 2013-01-20 – 2013-01-21 (×2): 12.5 mg via ORAL
  Filled 2013-01-20 (×2): qty 1

## 2013-01-20 MED ORDER — LEVOTHYROXINE SODIUM 100 MCG PO TABS
100.0000 ug | ORAL_TABLET | Freq: Every day | ORAL | Status: DC
  Administered 2013-01-20 – 2013-01-21 (×2): 100 ug via ORAL
  Filled 2013-01-20 (×3): qty 1

## 2013-01-20 MED ORDER — ASPIRIN EC 81 MG PO TBEC
81.0000 mg | DELAYED_RELEASE_TABLET | Freq: Every day | ORAL | Status: DC
  Administered 2013-01-20 – 2013-01-21 (×2): 81 mg via ORAL
  Filled 2013-01-20 (×2): qty 1

## 2013-01-20 NOTE — Evaluation (Signed)
Physical Therapy Evaluation Patient Details Name: Lisa Klein MRN: [id number] DOB: May 27, 1930 Today's Date: 01/20/2013 Time: 8295-6213 PT Time Calculation (min): 27 min  PT Assessment / Plan / Recommendation Clinical Impression  79 yo female admitted post fall resulting in hip fx, now s/p screw fixation; Presents with decr functional mobiltiy; will benefit from PT to maximize independence and safety with mobility, and to facilitate dc home    PT Assessment  Patient needs continued PT services    Follow Up Recommendations  CIR;Supervision/Assistance - 24 hour    Does the patient have the potential to tolerate intense rehabilitation      Barriers to Discharge Decreased caregiver support      Equipment Recommendations  Rolling walker with 5" wheels (3in1 (possibly drop-arm))    Recommendations for Other Services     Frequency Min 5X/week    Precautions / Restrictions Precautions Precautions: Fall Restrictions Weight Bearing Restrictions: Yes LLE Weight Bearing: Touchdown weight bearing   Pertinent Vitals/Pain Very painful with motion LLE RN provided medication to assist with pain control       Mobility  Bed Mobility Bed Mobility: Supine to Sit;Sitting - Scoot to Edge of Bed Supine to Sit: 1: +2 Total assist Supine to Sit: Patient Percentage: 50% Sitting - Scoot to Edge of Bed: 3: Mod assist Details for Bed Mobility Assistance: Cues for technique Transfers Transfers: Sit to Stand;Stand to Sit;Stand Pivot Transfers Sit to Stand: 3: Mod assist;With upper extremity assist;From chair/3-in-1;1: +2 Total assist;From bed Sit to Stand: Patient Percentage: 40% Stand to Sit: 1: +2 Total assist Stand to Sit: Patient Percentage: 50% Stand Pivot Transfers: 1: +2 Total assist Stand Pivot Transfers: Patient Percentage: 40% Details for Transfer Assistance: Cues fro technique, prepositioning, and precautions; difficulty rising from bed without armrests; noted also difficulty  getting R foot closer to center of mass, as she has limited knee flexion range Ambulation/Gait Ambulation/Gait Assistance: Other (comment) (Unable, difficulty keeping TDWBing LLE)    Exercises     PT Diagnosis: Difficulty walking;Abnormality of gait;Acute pain  PT Problem List: Decreased strength;Decreased range of motion;Decreased activity tolerance;Decreased balance;Decreased mobility;Decreased knowledge of use of DME;Decreased knowledge of precautions;Pain PT Treatment Interventions: DME instruction;Gait training;Functional mobility training;Therapeutic activities;Therapeutic exercise;Patient/family education   PT Goals Acute Rehab PT Goals PT Goal Formulation: With patient Time For Goal Achievement: 02/03/13 Potential to Achieve Goals: Good Pt will go Supine/Side to Sit: with supervision PT Goal: Supine/Side to Sit - Progress: Goal set today Pt will go Sit to Supine/Side: with supervision PT Goal: Sit to Supine/Side - Progress: Goal set today Pt will go Sit to Stand: with supervision PT Goal: Sit to Stand - Progress: Goal set today Pt will go Stand to Sit: with supervision PT Goal: Stand to Sit - Progress: Goal set today Pt will Transfer Bed to Chair/Chair to Bed: with supervision PT Transfer Goal: Bed to Chair/Chair to Bed - Progress: Goal set today Pt will Ambulate: 1 - 15 feet;with min assist;with rolling walker PT Goal: Ambulate - Progress: Goal set today Pt will Perform Home Exercise Program: with supervision, verbal cues required/provided PT Goal: Perform Home Exercise Program - Progress: Goal set today  Visit Information  Last PT Received On: 01/20/13 Assistance Needed: +2 PT/OT Co-Evaluation/Treatment: Yes    Subjective Data  Subjective: Needing to go to the bathroom; worried about urinary urgency   Prior Functioning  Home Living Lives With: Alone Type of Home: House Home Access: Stairs to enter [employer] of Steps: 5 Entrance Stairs-Rails:  Right  Home Layout: Two level;Able to live on main level with bedroom/bathroom Bathroom Shower/Tub: Engineer, manufacturing systems: Standard Home Adaptive Equipment: Bedside commode/3-in-1 Additional Comments: Please see OT eval for more home info Prior Function Level of Independence: Needs assistance Comments: Reports she has needed assistance with driving and grocery shopping since most recent hospitalization. Communication Communication: No difficulties    Cognition  Cognition Arousal/Alertness: Awake/alert Behavior During Therapy: WFL for tasks assessed/performed Overall Cognitive Status: Within Functional Limits for tasks assessed    Extremity/Trunk Assessment Right Upper Extremity Assessment RUE ROM/Strength/Tone: WFL for tasks assessed Left Upper Extremity Assessment LUE ROM/Strength/Tone: WFL for tasks assessed Right Lower Extremity Assessment RLE ROM/Strength/Tone: Deficits RLE ROM/Strength/Tone Deficits: Pt reports has arthritis R knee; Decr AROM, groslly able to flex knee to 80deg Left Lower Extremity Assessment LLE ROM/Strength/Tone: Deficits;Due to pain LLE ROM/Strength/Tone Deficits: Decr AROM and strength, limited by pain postop   Balance    End of Session PT - End of Session Equipment Utilized During Treatment: Gait belt;Oxygen Activity Tolerance: Patient tolerated treatment well Patient left: in chair;with call bell/phone within reach Nurse Communication: Mobility status  GP     Van Clines [facility] [place], [place] [phone]  01/20/2013, 1:09 PM

## 2013-01-20 NOTE — Progress Notes (Signed)
Orthopedic Tech Progress Note Patient Details:  Lisa Klein Oct 13, 1929 [id number] Applied overhead frame     Jennye Moccasin 01/20/2013, 5:06 PM

## 2013-01-20 NOTE — Progress Notes (Signed)
[facility] PROGRESS NOTE  Lisa Klein ZOX:[id number] DOB: March 24, 1930 DOA: 01/18/2013 PCP: [name], MD  Assessment/Plan: Left hip fracture  - restart heparin once okay with orthopedic surgery  -S/p L hip surgery  -Continue pain medications  History of PE and DVT  - anticoagulation when OK with surgery as per above  Hypertension  - continue home medications with when necessary IV hydralazine for systolic pressure more than 160.  Hypothyroidism  - continue home medications.  History of breast cancer  - per oncologist.  Code Status: Full Family Communication: Pt in room (indicate person spoken with, relationship, and if by phone, the number) Disposition Plan: pending  Consultants:  Orthopedics  Procedures:  Closed reduction, screw fixation of L femoral neck fracture on 01/20/13  HPI/Subjective: Pt feels well. No complaints.  Objective: Filed Vitals:   01/20/13 0000 01/20/13 0308 01/20/13 0400 01/20/13 0639  BP:  140/66  136/65  Pulse:  78  84  Temp:  98.4 F (36.9 C)  98.8 F (37.1 C)  TempSrc:  Oral  Oral  Resp: 17 16 18 16   Height:      Weight:      SpO2: 97% 97% 98% 96%    Intake/Output Summary (Last 24 hours) at 01/20/13 0809 Last data filed at 01/20/13 8119  Gross per 24 hour  Intake    240 ml  Output   2250 ml  Net  -2010 ml   Filed Weights   01/18/13 1436 01/18/13 2100  Weight: 84.823 kg (187 lb) 83.462 kg (184 lb)    Exam:   General:  Awake, in nad  Cardiovascular: regular, s1, s2  Respiratory: normal resp effort, no wheezing  Abdomen: soft, nondistended  Musculoskeletal: perfused, no clubbing   Data Reviewed: Basic Metabolic Panel:  Recent Labs Lab 01/14/13 1000 01/18/13 1618 01/19/13 0805 01/19/13 1958 01/20/13 0540  NA 142 140 141  --  137  K 4.0 3.6 3.3*  --  3.7  CL 105 102 105  --  101  CO2 27 29 28   --  28  GLUCOSE 98 94 93  --  118*  BUN 12 11 9   --  6  CREATININE 0.92 0.84 0.76 0.67 0.73   CALCIUM 9.4 9.6 8.4  --  8.3*   Liver Function Tests:  Recent Labs Lab 01/19/13 0805  AST 29  ALT 21  ALKPHOS 60  BILITOT 0.8  PROT 5.9*  ALBUMIN 2.8*   No results found for this basename: LIPASE, AMYLASE,  in the last 168 hours No results found for this basename: AMMONIA,  in the last 168 hours CBC:  Recent Labs Lab 01/14/13 1000 01/18/13 1618 01/19/13 0805 01/19/13 1958 01/20/13 0540  WBC 3.7* 5.1 4.1 7.7 4.7  NEUTROABS 1.8 3.0 2.3  --   --   HGB 12.5 11.6* 10.8* 11.8* 10.3*  HCT 38.5 35.6* 33.2* 36.3 31.5*  MCV 87.3 88.3 87.8 89.2 87.0  PLT 193 146* 127* 143* 129*   Cardiac Enzymes:  Recent Labs Lab 01/18/13 1618  TROPONINI <0.30   BNP (last 3 results) No results found for this basename: PROBNP,  in the last 8760 hours CBG: No results found for this basename: GLUCAP,  in the last 168 hours  Recent Results (from the past 240 hour(s))  URINE CULTURE     Status: None   Collection Time    01/19/13  1:05 AM      Result Value Range Status   Specimen Description URINE, CATHETERIZED  Final   Special Requests NONE   Final   Culture  Setup Time 01/19/2013 01:29   Final   Colony Count NO GROWTH   Final   Culture NO GROWTH   Final   Report Status 01/20/2013 FINAL   Final  SURGICAL PCR SCREEN     Status: None   Collection Time    01/19/13  5:12 AM      Result Value Range Status   MRSA, PCR NEGATIVE  NEGATIVE Final   Staphylococcus aureus NEGATIVE  NEGATIVE Final   Comment:            The Xpert SA Assay (FDA     approved for NASAL specimens     in patients over 35 years of age),     is one component of     a comprehensive surveillance     program.  Test performance has     been validated by [employer] for patients greater     than or equal to 60 year old.     It is not intended     to diagnose infection nor to     guide or monitor treatment.     Studies: Dg Hip Complete Left  01/18/2013   *RADIOLOGY REPORT*  Clinical Data: Pain post fall   LEFT HIP - COMPLETE 2+ VIEW  Comparison: None.  Findings: Five views of the left hip submitted.  There is mild impacted fracture of the left femoral neck. Multiple pelvic phleboliths.  IMPRESSION: Mild impacted fracture of the left femoral neck.   Original Report Authenticated By: Natasha Mead, M.D.   Dg Hip Operative Left  01/19/2013   *RADIOLOGY REPORT*  Clinical Data: 79 year old female undergoing tenting of the left hip.  OPERATIVE LEFT HIP  Comparison: 01/18/2013.  Findings: Two intraoperative fluoroscopic views.  Four cannulated screws traverse the proximal left femur.  Evidence of a subcapital neck fracture.  Near anatomic alignment.  Hardware appears intact.  IMPRESSION: ORIF proximal left femur with no adverse features.   Original Report Authenticated By: Erskine Speed, M.D.   Dg Pelvis Portable  01/19/2013   *RADIOLOGY REPORT*  Clinical Data: 79 year old female status post left hip pinning.  PORTABLE PELVIS  Comparison: Intraoperative radiographs 1646 hours the same day.  Findings: AP portable pelvis.  Cannulated screws traversing the proximal left femur appears stable.  Near anatomic alignment at the subcapital appearing fracture.  Other visualized osseous structures appear intact.  Postoperative changes to the left lower extremity soft tissues including some subcutaneous gas.  IMPRESSION: ORIF proximal left femur with no adverse features identified.   Original Report Authenticated By: Erskine Speed, M.D.   Dg Chest Port 1 View  01/18/2013   *RADIOLOGY REPORT*  Clinical Data: Fall.  Preop respiratory exam for left hip fracture. Breast cancer.  CHEST - 2 VIEW  Comparison:  10/17/2012  Findings:  The heart size and mediastinal contours are within normal limits.  Both lungs are clear.  The visualized skeletal structures are unremarkable.Surgical clips again noted in the left axilla.  IMPRESSION: No active cardiopulmonary disease.   Original Report Authenticated By: Myles Rosenthal, M.D.   Dg Knee Complete 4  Views Left  01/18/2013   *RADIOLOGY REPORT*  Clinical Data: Pain post fall  LEFT KNEE - COMPLETE 4+ VIEW  Comparison: None.  Findings: Four views of the left knee submitted.  Diffuse narrowing of the joint space.  Narrowing of patellofemoral joint space.  No acute fracture or  subluxation.  IMPRESSION: No acute fracture or subluxation.  Degenerative changes as described above.   Original Report Authenticated By: Natasha Mead, M.D.    Scheduled Meds: . [MAR HOLD] anastrozole  1 mg Oral QHS  . [facility] HOLD] aspirin EC  81 mg Oral Daily  . [MAR HOLD] calcium-vitamin D  1 tablet Oral BID  . docusate sodium  100 mg Oral BID  . [facility] HOLD] hydrochlorothiazide  12.5 mg Oral q morning - 10a  . [facility] HOLD] levothyroxine  100 mcg Oral QAC breakfast  . [MAR HOLD] potassium chloride  10 mEq Oral BID WC  . [facility] HOLD] pyridOXINE  100 mg Oral Daily  . Rivaroxaban  20 mg Oral Q supper  . [facility] HOLD] simvastatin  10 mg Oral q1800   Continuous Infusions: . dextrose 5 % and 0.45% NaCl 50 mL/hr at 01/19/13 1915  . lactated ringers 50 mL/hr at 01/19/13 1511    Principal Problem:   Hip fracture, left Active Problems:   HYPOTHYROIDISM   HYPERTENSION   Infiltrating ductal carcinoma of breast   Pulmonary embolism    Time spent:    Janan Bogie K  [facility] Pager [phone]. If 7PM-7AM, please contact night-coverage at [email or web address], password [facility] 01/20/2013, 8:09 AM  LOS: 2 days

## 2013-01-20 NOTE — Progress Notes (Signed)
Rehab Admissions Coordinator Note:  Patient was screened by Trish Mage for appropriateness for an Inpatient Acute Rehab Consult.  Noted PT recommending CIR.  Patient has [facility] and it is highly unlikely that we could get approval for an inpatient rehab admission.   At this time, we are recommending Skilled Nursing Facility.  Call me for questions.    Trish Mage 01/20/2013, 1:29 PM  I can be reached at [phone].

## 2013-01-20 NOTE — Op Note (Signed)
[name], Klein NO.:  [id number]  MEDICAL RECORD NO.:  [id number]  LOCATION:  5N19C                        FACILITY:  [facility]  PHYSICIAN:  Vania Rea. Marsela Kuan, M.D.  DATE OF BIRTH:  July 05, 1930  DATE OF PROCEDURE:  01/19/2013 DATE OF DISCHARGE:                              OPERATIVE REPORT   PREOPERATIVE DIAGNOSIS:  Impacted, minimally displaced left femoral neck fracture.  POSTOPERATIVE DIAGNOSIS:  Impacted, minimally displaced left femoral neck fracture.  PROCEDURE:  Closed reduction, and multiple cancellous screw fixation of left impacted femoral neck fracture.  SURGEON:  Vania Rea. Dayanna Pryce, M.D.  ASSISTANT:  None.  ANESTHESIA:  General endotracheal.  BLOOD LOSS:  Minimal.  DRAINS:  None.  HISTORY:  [name] is an 79 year old female who fell yesterday complaining of left hip pain and inability to bear weight.  Taken to [facility] where she was found to have an impacted femoral neck fracture, transferred to [facility] and admitted to hospitalist service and now brought to the operating room for planned multiple cancellous screw fixation of the left femoral neck fracture.  Preoperatively, we counseled Ms. Niedermeier on treatment options as well as risks versus benefits thereof.  Possible surgical complications were reviewed with the potential for bleeding, infection, neurovascular injury, malunion, nonunion, loss of fixation, possible need for additional surgery.  She understands and accepts and agrees to planned procedure.  PROCEDURE IN DETAIL:  After undergoing routine preop evaluation, the patient did receive prophylactic antibiotics.  Placed supine on her hospital bed in the operating room, underwent smooth induction of general endotracheal anesthesia.  Turned to the fracture table in supine position, and appropriately padded and protected.  Right leg placed in well leg holder.  Left leg placed in gentle traction.   Fluoroscopic imaging was then used to confirm the appropriate alignment at the fracture site.  The left hip girdle region was then sterilely prepped and draped in standard fashion.  Time-out was called.  I outlined the appropriate starting point and then made a 4-cm incision laterally over the femur at the appropriate starting point.  Dissection was carried down through the skin, subcu, and deep fascia, and then bluntly split the vastus lateralis to gain access to the lateral femoral cortex.  At this point then, I placed a series of 4 threaded guide pins up through the femoral neck and head to the proper depth.  Appropriate position confirmed fluoroscopically.  These were all then over drilled with the appropriate length 7.3 cannulated screws.  Good fixation was achieved. The guidewires were removed.  Final fluoroscopic imaging confirmed good position of the fracture site, good position of the hardware.  The wound was irrigated and closed with 2-0 Vicryl subcu and then Steri-Strips.  Dry dressing applied.  The patient was then removed from traction, placed supine, awakened, extubated, and taken to the recovery room in stable condition.     Vania Rea. Kolden Dupee, M.D.     KMS/MEDQ  D:  01/19/2013  T:  01/20/2013  Job:  [id number]

## 2013-01-20 NOTE — Anesthesia Postprocedure Evaluation (Signed)
  Anesthesia Post-op Note  Patient: Lisa Klein  Procedure(s) Performed: Procedure(s): CANNULATED HIP PINNING-  left (Left)  Patient Location: PACU  Anesthesia Type:General  Level of Consciousness: awake, oriented and patient cooperative  Airway and Oxygen Therapy: Patient Spontanous Breathing  Post-op Pain: moderate  Post-op Assessment: Post-op Vital signs reviewed, Patient's Cardiovascular Status Stable, Respiratory Function Stable, Patent Airway, No signs of Nausea or vomiting and Pain level controlled  Post-op Vital Signs: stable  Complications: No apparent anesthesia complications

## 2013-01-20 NOTE — Clinical Social Work Placement (Addendum)
Clinical Social Work Department  CLINICAL SOCIAL WORK PLACEMENT NOTE  01/20/2013  Patient: Lisa Klein Account Number: [id number] Admit date: 01/16/13  Clinical Social Worker: Sabino Niemann MSW Date/time: 01/20/2013 3:30 PM  Clinical Social Work is seeking post-discharge placement for this patient at the following level of care: SKILLED NURSING (*CSW will update this form in Epic as items are completed)  01/20/2013 Patient/family provided with [facility] Department of Clinical Social Work's list of facilities offering this level of care within the geographic area requested by the patient (or if unable, by the patient's family).  01/20/2013 Patient/family informed of their freedom to choose among providers that offer the needed level of care, that participate in Medicare, Medicaid or managed care program needed by the patient, have an available bed and are willing to accept the patient.  01/20/2013 Patient/family informed of [facility]' ownership interest in [facility], as well as of the fact that they are under no obligation to receive care at this facility.  PASARR submitted to EDS on 01/21/2013 PASARR number received from EDS on 01/21/2013 FL2 transmitted to all facilities in geographic area requested by pt/family on 01/20/13  FL2 transmitted to all facilities within larger geographic area on  Patient informed that his/her managed care company has contracts with or will negotiate with certain facilities, including the following:  Patient/family informed of bed offers received: 01/21/2013 Patient chooses bed at [facility] Physician recommends and patient chooses bed at  Patient to be transferred to on 01/21/2013 Patient to be transferred to facility by [facility] The following physician request were entered in Epic:  Additional Comments:

## 2013-01-20 NOTE — Evaluation (Signed)
Occupational Therapy Evaluation Patient Details Name: Lisa Klein MRN: [id number] DOB: [birth year] Today's Date: 01/20/2013 Time: 7829-5621 OT Time Calculation (min): 27 min  OT Assessment / Plan / Recommendation Clinical Impression    79 yo female admitted post fall resulting in hip fx, now s/p screw fixation; Presents with below problem list; will benefit from OT to maximize independence, and to facilitate dc home. Recommend CIR.       OT Assessment  Patient needs continued OT Services    Follow Up Recommendations  CIR;Supervision/Assistance - 24 hour    Barriers to Discharge Decreased caregiver support    Equipment Recommendations  Other (comment) (tbd)    Recommendations for Other Services    Frequency  Min 2X/week    Precautions / Restrictions Precautions Precautions: Fall Restrictions Weight Bearing Restrictions: Yes LLE Weight Bearing: Touchdown weight bearing   Pertinent Vitals/Pain  Very painful with motion LLE  RN provided medication to assist with pain control    ADL  Eating/Feeding: Independent Where Assessed - Eating/Feeding: Chair Grooming: Set up Where Assessed - Grooming: Supported sitting Upper Body Bathing: Set up Where Assessed - Upper Body Bathing: Supported sitting Lower Body Bathing: Maximal assistance Where Assessed - Lower Body Bathing: Supported sit to stand Upper Body Dressing: Set up Where Assessed - Upper Body Dressing: Supported sitting Lower Body Dressing: +1 Total assistance Where Assessed - Lower Body Dressing: Supported sit to Pharmacist, hospital: Performed;Moderate assistance Toilet Transfer Method: Sit to stand Toilet Transfer Equipment: Raised toilet seat with arms (or 3-in-1 over toilet) Toileting - Clothing Manipulation and Hygiene: +1 Total assistance;Set up;Supervision/safety (hygiene-setup/supervision and clothing-total) Where Assessed - Toileting Clothing Manipulation and Hygiene: Sit to stand from 3-in-1 or  toilet;Sit on 3-in-1 or toilet Tub/Shower Transfer Method: Not assessed Equipment Used: Gait belt;Rolling walker ADL Comments: Pt overall requiring Max A/Total A for LB dressing and bathing. Pt performed hygiene while sitting on 3 in 1 with cues to maintain precautions on LLE.    OT Diagnosis: Acute pain  OT Problem List: Decreased strength;Decreased range of motion;Decreased activity tolerance;Impaired balance (sitting and/or standing);Decreased knowledge of use of DME or AE;Decreased knowledge of precautions;Pain OT Treatment Interventions: Self-care/ADL training;DME and/or AE instruction;Therapeutic activities;Patient/family education;Balance training   OT Goals Acute Rehab OT Goals OT Goal Formulation: With patient Time For Goal Achievement: 01/27/13 Potential to Achieve Goals: Good ADL Goals Pt Will Perform Grooming: with modified independence;Standing at sink ADL Goal: Grooming - Progress: Goal set today Pt Will Perform Lower Body Bathing: with supervision;Sit to stand from chair ADL Goal: Lower Body Bathing - Progress: Goal set today Pt Will Perform Lower Body Dressing: with supervision;Sit to stand from bed;Sit to stand from chair ADL Goal: Lower Body Dressing - Progress: Goal set today Pt Will Transfer to Toilet: with DME;with supervision ADL Goal: Toilet Transfer - Progress: Goal set today Pt Will Perform Toileting - Clothing Manipulation: Standing;with supervision ADL Goal: Toileting - Clothing Manipulation - Progress: Goal set today Pt Will Perform Toileting - Hygiene: with modified independence;Sit to stand from 3-in-1/toilet;Sitting on 3-in-1 or toilet ADL Goal: Toileting - Hygiene - Progress: Goal set today Pt Will Perform Tub/Shower Transfer: Tub transfer;Ambulation;with DME;with supervision ADL Goal: Tub/Shower Transfer - Progress: Goal set today  Visit Information  Last OT Received On: 01/20/13 Assistance Needed: +2 PT/OT Co-Evaluation/Treatment: Yes    Subjective  Data      Prior Functioning     Home Living Lives With: Alone Type of Home: House Home Access: Stairs to enter [employer]  of Steps: 5 Entrance Stairs-Rails: Right Home Layout: Two level;Able to live on main level with bedroom/bathroom Bathroom Shower/Tub: Engineer, manufacturing systems: Standard Home Adaptive Equipment: Bedside commode/3-in-1 Additional Comments:  Prior Function Level of Independence: Needs assistance Comments: Reports she has needed assistance with driving and grocery shopping since most recent hospitalization. Communication Communication: No difficulties         Vision/Perception     Cognition  Cognition Arousal/Alertness: Awake/alert Behavior During Therapy: WFL for tasks assessed/performed Overall Cognitive Status: Within Functional Limits for tasks assessed    Extremity/Trunk Assessment Right Upper Extremity Assessment RUE ROM/Strength/Tone: WFL for tasks assessed Left Upper Extremity Assessment LUE ROM/Strength/Tone: WFL for tasks assessed       Mobility Bed Mobility Bed Mobility: Supine to Sit;Sitting - Scoot to Edge of Bed Supine to Sit: 1: +2 Total assist Supine to Sit: Patient Percentage: 50% Sitting - Scoot to Edge of Bed: 3: Mod assist Details for Bed Mobility Assistance: Cues for technique Transfers Transfers: Sit to Stand;Stand to Sit Sit to Stand: 3: Mod assist;With upper extremity assist;From chair/3-in-1;1: +2 Total assist;From bed Sit to Stand: Patient Percentage: 40% Stand to Sit: 1: +2 Total assist Stand to Sit: Patient Percentage: 50% Details for Transfer Assistance: Cues fro technique, prepositioning, and precautions; difficulty rising from bed without armrests; noted also difficulty getting R foot closer to center of mass, as she has limited knee flexion range     Exercise     Balance     End of Session OT - End of Session Equipment Utilized During Treatment: Gait belt Activity Tolerance: Patient  tolerated treatment well;Patient limited by pain Patient left: in chair;with call bell/phone within reach  [employer] OTR/L [phone] 01/20/2013, 1:55 PM

## 2013-01-20 NOTE — Clinical Social Work Psychosocial (Signed)
Clinical Social Work Department  BRIEF PSYCHOSOCIAL ASSESSMENT  Patient: Lisa Klein  Account Number: [id number]   Admit date: 01/18/13 Clinical Social Worker Sabino Niemann, MSW Date/Time: 01/20/2013 3:00 PM Referred by: Physician Date Referred:  Referred for   SNF Placement   Other Referral:  Interview type: Patient  Other interview type: PSYCHOSOCIAL DATA  Living Status:Patient lives alone Admitted from facility:  Level of care:  Primary support name: Froh,Kelly Primary support relationship to patient: Daughter Degree of support available:  Strong and vested  CURRENT CONCERNS  Current Concerns   Post-Acute Placement   Other Concerns:  SOCIAL WORK ASSESSMENT / PLAN  CSW met with pt re: PT recommendation for SNF.   Pt lives alone  CSW explained placement process and answered questions.   Pt reports no preference at this time   CSW completed FL2 and initiated SNF search.     Assessment/plan status: Information/Referral to [facility]  Other assessment/ plan:  Information/referral to community resources:  SNF   PTAR  PATIENT'S/FAMILY'S RESPONSE TO PLAN OF CARE:  Pt  reports she is agreeable to ST SNF in order to increase strength and independence with mobility prior to returning home  Pt verbalized understanding of placement process and appreciation for CSW assist.   Sabino Niemann, MSW [phone]

## 2013-01-20 NOTE — Progress Notes (Signed)
Lisa Klein  MRN: [id number] DOB/Age: July 31, 1930 79 y.o. Physician: Lynnea Maizes, M.D. 1 Day Post-Op Procedure(s) (LRB): CANNULATED HIP PINNING-  left (Left)  Subjective: Sitting in chair at bedside, reports mild hip pain Vital Signs Temp:  [97.5 F (36.4 C)-98.8 F (37.1 C)] 98.8 F (37.1 C) (06/17 0639) Pulse Rate:  [64-94] 84 (06/17 0639) Resp:  [13-24] 16 (06/17 1144) BP: (127-153)/(61-72) 136/65 mmHg (06/17 0639) SpO2:  [94 %-100 %] 95 % (06/17 1144) FiO2 (%):  [21 %] 21 % (06/16 1529)  Lab Results  Recent Labs  01/19/13 1958 01/20/13 0540  WBC 7.7 4.7  HGB 11.8* 10.3*  HCT 36.3 31.5*  PLT 143* 129*   BMET  Recent Labs  01/19/13 0805 01/19/13 1958 01/20/13 0540  NA 141  --  137  K 3.3*  --  3.7  CL 105  --  101  CO2 28  --  28  GLUCOSE 93  --  118*  BUN 9  --  6  CREATININE 0.76 0.67 0.73  CALCIUM 8.4  --  8.3*   INR  Date Value Range Status  01/18/2013 1.68* 0.00 - 1.49 Final     Exam  Dressing dry, thigh soft, N/V intact  Plan Mobilize with PT, d/c planning for SNF  Nadirah Socorro M 01/20/2013, 1:58 PM

## 2013-01-21 ENCOUNTER — Inpatient Hospital Stay
Admission: RE | Admit: 2013-01-21 | Discharge: 2013-03-17 | Disposition: A | Discharge status: Home / Self Care | Payer: Medicare HMO | Source: Ambulatory Visit | Attending: Internal Medicine | Admitting: Internal Medicine

## 2013-01-21 DIAGNOSIS — S72009A Fracture of unspecified part of neck of unspecified femur, initial encounter for closed fracture: Secondary | ICD-10-CM

## 2013-01-21 LAB — CBC
HCT: 32.6 % — ABNORMAL LOW (ref 36.0–46.0)
MCH: 28.3 pg (ref 26.0–34.0)
MCV: 87.2 fL (ref 78.0–100.0)
RBC: 3.74 MIL/uL — ABNORMAL LOW (ref 3.87–5.11)
WBC: 4.4 10*3/uL (ref 4.0–10.5)

## 2013-01-21 LAB — BASIC METABOLIC PANEL
BUN: 9 mg/dL (ref 6–23)
CO2: 29 mEq/L (ref 19–32)
Chloride: 98 mEq/L (ref 96–112)
Creatinine, Ser: 0.72 mg/dL (ref 0.50–1.10)

## 2013-01-21 MED ORDER — BOOST / RESOURCE BREEZE PO LIQD: 1.0000 | Freq: Two times a day (BID) | ORAL | Status: DC

## 2013-01-21 MED ORDER — HYDROCODONE-ACETAMINOPHEN 5-325 MG PO TABS: 1.0000 | ORAL_TABLET | Freq: Four times a day (QID) | ORAL | Status: DC | PRN

## 2013-01-21 NOTE — Progress Notes (Signed)
Patient feeling as if she was going to "pass out" around 0850. Fluids were encouraged and VS were stable: BP 126/70 HR 70 Temp 97.8 RR 18 O2 Sat 100 on 2L. MD was text paged about situation. Reassessed patient at 0930 and again at 1020 and patient stated that she was feeling better. Encouraged patient to call if she felt worse. Will continue to monitor.

## 2013-01-21 NOTE — Progress Notes (Signed)
Attempted to call report to [facility] multiple times and was unsuccessful. Pt discharged to SNF. Pts IV was removed. Pt left unit in a stable condition via EMS.

## 2013-01-21 NOTE — Progress Notes (Signed)
Physical Therapy Treatment Patient Details Name: Lisa Klein MRN: [id number] DOB: 04/28/1930 Today's Date: 01/21/2013 Time: 9562-1308 PT Time Calculation (min): 14 min  PT Assessment / Plan / Recommendation Comments on Treatment Session  Patient progressing with mobility however limited by ability to maintain TTWB on L. Patient transferred from recliner to [facility] back to recliner. Able to tolerate some therex this session as well. Insurance will not approve rehab stay. Will update to SNF    Follow Up Recommendations  Supervision/Assistance - 24 hour;SNF     Does the patient have the potential to tolerate intense rehabilitation     Barriers to Discharge        Equipment Recommendations  Rolling walker with 5" wheels    Recommendations for Other Services    Frequency Min 3X/week   Plan Discharge plan needs to be updated;Frequency needs to be updated    Precautions / Restrictions Precautions Precautions: Fall Restrictions Weight Bearing Restrictions: Yes LLE Weight Bearing: Touchdown weight bearing   Pertinent Vitals/Pain no apparent distress     Mobility  Bed Mobility Bed Mobility: Not assessed Transfers Sit to Stand: 1: +2 Total assist;From chair/3-in-1;With upper extremity assist;With armrests Sit to Stand: Patient Percentage: 60% Stand to Sit: 1: +2 Total assist;To chair/3-in-1;With upper extremity assist;With armrests Stand to Sit: Patient Percentage: 60% Stand Pivot Transfers: 1: +2 Total assist;With armrests Stand Pivot Transfers: Patient Percentage: 60% Details for Transfer Assistance: Cues fro technique, prepositioning, and precautions; Patient unable to maintain TTWB status throughout and needing multiple cues and assitance to prevent. Appears more efficient with transfers today.     Exercises General Exercises - Lower Extremity Long Arc Quad: AROM;Left;10 reps Heel Slides: AAROM;Left;10 reps Hip ABduction/ADduction: AAROM;Left;10 reps   PT Diagnosis:     PT Problem List:   PT Treatment Interventions:     PT Goals Acute Rehab PT Goals PT Goal: Sit to Stand - Progress: Progressing toward goal PT Goal: Stand to Sit - Progress: Progressing toward goal PT Transfer Goal: Bed to Chair/Chair to Bed - Progress: Progressing toward goal PT Goal: Perform Home Exercise Program - Progress: Progressing toward goal  Visit Information  Last PT Received On: 01/21/13 Assistance Needed: +2    Subjective Data      Cognition  Cognition Arousal/Alertness: Awake/alert Behavior During Therapy: WFL for tasks assessed/performed Overall Cognitive Status: Within Functional Limits for tasks assessed    Balance     End of Session PT - End of Session Equipment Utilized During Treatment: Gait belt Activity Tolerance: Patient tolerated treatment well Patient left: in chair;with call bell/phone within reach Nurse Communication: Mobility status   GP     Fredrich Birks 01/21/2013, 11:20 AM 01/21/2013 Fredrich Birks PTA [phone] pager [phone] office

## 2013-01-21 NOTE — Discharge Summary (Signed)
Physician Discharge Summary  Lisa Klein ZOX:[id number] DOB: 1930/01/21 DOA: 01/18/2013  PCP: [name], MD  Admit date: 01/18/2013 Discharge date: 01/21/2013  Time spent: Greater than 30 minutes  Recommendations for Outpatient Follow-up:  -To SNF for ST rehab.   Discharge Diagnoses:  Principal Problem:   Hip fracture, left Active Problems:   HYPOTHYROIDISM   HYPERTENSION   Infiltrating ductal carcinoma of breast   Pulmonary embolism   Discharge Condition: Stable and improved  Filed Weights   01/18/13 1436 01/18/13 2100  Weight: 84.823 kg (187 lb) 83.462 kg (184 lb)    History of present illness:  Patient is an 79 y.o. female with known history of PE and DVT on xarelto, breast cancer followed by an oncologist, hypothyroidism, hypertension had a fall last night at her house. Patient states that she slipped and fell and did not lose consciousness. Since then left hip has been hurting and did not improve and she came to the ER and x-ray show fracture. On-call orthopedic surgeon was consulted by the ER physician and patient has been transferred to [facility] for further management. Patient denies any chest pain or shortness of breath. Denies any nausea vomiting.   Hospital Course:   Left hip fracture  -S/p L hip surgery  -Continue pain medications  -Management as per ortho. -Continue xarelto for prophylaxis.  History of PE and DVT  - Xarelto has been restarted.  Hypertension  - continue home medications. -Well controlled while in the hospital.  Hypothyroidism  - continue home medications.   History of breast cancer  - per oncologist.   Procedures:  Left hip repair   Consultations:  Ortho, Dr. Rennis Chris  Discharge Instructions  Discharge Orders   Future Appointments Provider Department Dept Phone   02/13/2013 10:30 AM Kerri Perches, MD [facility] [phone]   04/24/2013 2:30 PM Ellouise Newer, PA-C [facility]  [phone]   Future Orders Complete By Expires     Diet - low sodium heart healthy  As directed     Discontinue IV  As directed     Increase activity slowly  As directed         Medication List    STOP taking these medications       acetaminophen 500 MG tablet  Commonly known as:  TYLENOL      TAKE these medications       acetaminophen 650 MG CR tablet  Commonly known as:  TYLENOL  Take 650 mg by mouth daily as needed for pain.     anastrozole 1 MG tablet  Commonly known as:  ARIMIDEX  Take 1 mg by mouth at bedtime.     aspirin EC 81 MG tablet  Take 81 mg by mouth daily.     docusate sodium 100 MG capsule  Commonly known as:  COLACE  Take 100 mg by mouth daily as needed for constipation. For constipation     hydrochlorothiazide 12.5 MG capsule  Commonly known as:  MICROZIDE  Take 1 capsule (12.5 mg total) by mouth every morning.     HYDROcodone-acetaminophen 5-325 MG per tablet  Commonly known as:  NORCO/VICODIN  Take 1-2 tablets by mouth every 6 (six) hours as needed.     levothyroxine 100 MCG tablet  Commonly known as:  SYNTHROID, LEVOTHROID  Take 1 tablet (100 mcg total) by mouth every morning.     lovastatin 20 MG tablet  Commonly known as:  MEVACOR  Take 1 tablet (20  mg total) by mouth at bedtime.     OSCAL 500/200 D-3 500-200 MG-UNIT per tablet  Generic drug:  calcium-vitamin D  Take 1 tablet by mouth 2 (two) times daily.     potassium chloride 10 MEQ tablet  Commonly known as:  K-DUR,KLOR-CON  Take 1 tablet (10 mEq total) by mouth 2 (two) times daily.     pyridOXINE 100 MG tablet  Commonly known as:  VITAMIN B-6  Take 100 mg by mouth daily.     Rivaroxaban 20 MG Tabs  Commonly known as:  XARELTO  Take 1 tablet (20 mg total) by mouth daily with supper.       No Known Allergies     Follow-up Information   Follow up with [name], MD. Schedule an appointment as soon as possible for a visit in 2 weeks.   Contact information:   [address], [address] [place] [address] [phone]        The results of significant diagnostics from this hospitalization (including imaging, microbiology, ancillary and laboratory) are listed below for reference.    Significant Diagnostic Studies: Dg Hip Complete Left  01/18/2013   *RADIOLOGY REPORT*  Clinical Data: Pain post fall  LEFT HIP - COMPLETE 2+ VIEW  Comparison: None.  Findings: Five views of the left hip submitted.  There is mild impacted fracture of the left femoral neck. Multiple pelvic phleboliths.  IMPRESSION: Mild impacted fracture of the left femoral neck.   Original Report Authenticated By: Natasha Mead, M.D.   Dg Hip Operative Left  01/19/2013   *RADIOLOGY REPORT*  Clinical Data: 79 year old female undergoing tenting of the left hip.  OPERATIVE LEFT HIP  Comparison: 01/18/2013.  Findings: Two intraoperative fluoroscopic views.  Four cannulated screws traverse the proximal left femur.  Evidence of a subcapital neck fracture.  Near anatomic alignment.  Hardware appears intact.  IMPRESSION: ORIF proximal left femur with no adverse features.   Original Report Authenticated By: Erskine Speed, M.D.   Dg Pelvis Portable  01/19/2013   *RADIOLOGY REPORT*  Clinical Data: 79 year old female status post left hip pinning.  PORTABLE PELVIS  Comparison: Intraoperative radiographs 1646 hours the same day.  Findings: AP portable pelvis.  Cannulated screws traversing the proximal left femur appears stable.  Near anatomic alignment at the subcapital appearing fracture.  Other visualized osseous structures appear intact.  Postoperative changes to the left lower extremity soft tissues including some subcutaneous gas.  IMPRESSION: ORIF proximal left femur with no adverse features identified.   Original Report Authenticated By: Erskine Speed, M.D.   Dg Chest Port 1 View  01/18/2013   *RADIOLOGY REPORT*  Clinical Data: Fall.  Preop respiratory exam for left hip fracture. Breast cancer.  CHEST - 2  VIEW  Comparison:  10/17/2012  Findings:  The heart size and mediastinal contours are within normal limits.  Both lungs are clear.  The visualized skeletal structures are unremarkable.Surgical clips again noted in the left axilla.  IMPRESSION: No active cardiopulmonary disease.   Original Report Authenticated By: Myles Rosenthal, M.D.   Dg Knee Complete 4 Views Left  01/18/2013   *RADIOLOGY REPORT*  Clinical Data: Pain post fall  LEFT KNEE - COMPLETE 4+ VIEW  Comparison: None.  Findings: Four views of the left knee submitted.  Diffuse narrowing of the joint space.  Narrowing of patellofemoral joint space.  No acute fracture or subluxation.  IMPRESSION: No acute fracture or subluxation.  Degenerative changes as described above.   Original Report  Authenticated By: Natasha Mead, M.D.    Microbiology: Recent Results (from the past 240 hour(s))  URINE CULTURE     Status: None   Collection Time    01/19/13  1:05 AM      Result Value Range Status   Specimen Description URINE, CATHETERIZED   Final   Special Requests NONE   Final   Culture  Setup Time 01/19/2013 01:29   Final   Colony Count NO GROWTH   Final   Culture NO GROWTH   Final   Report Status 01/20/2013 FINAL   Final  SURGICAL PCR SCREEN     Status: None   Collection Time    01/19/13  5:12 AM      Result Value Range Status   MRSA, PCR NEGATIVE  NEGATIVE Final   Staphylococcus aureus NEGATIVE  NEGATIVE Final   Comment:            The Xpert SA Assay (FDA     approved for NASAL specimens     in patients over 74 years of age),     is one component of     a comprehensive surveillance     program.  Test performance has     been validated by [employer] for patients greater     than or equal to 75 year old.     It is not intended     to diagnose infection nor to     guide or monitor treatment.     Labs: Basic Metabolic Panel:  Recent Labs Lab 01/18/13 1618 01/19/13 0805 01/19/13 1958 01/20/13 0540 01/21/13 0455  NA 140 141  --   137 134*  K 3.6 3.3*  --  3.7 3.5  CL 102 105  --  101 98  CO2 29 28  --  28 29  GLUCOSE 94 93  --  118* 103*  BUN 11 9  --  6 9  CREATININE 0.84 0.76 0.67 0.73 0.72  CALCIUM 9.6 8.4  --  8.3* 8.6   Liver Function Tests:  Recent Labs Lab 01/19/13 0805  AST 29  ALT 21  ALKPHOS 60  BILITOT 0.8  PROT 5.9*  ALBUMIN 2.8*   No results found for this basename: LIPASE, AMYLASE,  in the last 168 hours No results found for this basename: AMMONIA,  in the last 168 hours CBC:  Recent Labs Lab 01/18/13 1618 01/19/13 0805 01/19/13 1958 01/20/13 0540 01/21/13 0455  WBC 5.1 4.1 7.7 4.7 4.4  NEUTROABS 3.0 2.3  --   --   --   HGB 11.6* 10.8* 11.8* 10.3* 10.6*  HCT 35.6* 33.2* 36.3 31.5* 32.6*  MCV 88.3 87.8 89.2 87.0 87.2  PLT 146* 127* 143* 129* 121*   Cardiac Enzymes:  Recent Labs Lab 01/18/13 1618  TROPONINI <0.30   BNP: BNP (last 3 results) No results found for this basename: PROBNP,  in the last 8760 hours CBG: No results found for this basename: GLUCAP,  in the last 168 hours     Signed:  Chaya Jan  [facility] Pager: [phone] 01/21/2013, 2:13 PM

## 2013-01-21 NOTE — Progress Notes (Signed)
Reviewed progress, discussed case with Raynelle Fanning, PTA, and agree with adjustment in plan of care. Souris, Lisa Klein [phone]

## 2013-01-21 NOTE — Progress Notes (Signed)
Lisa Klein  MRN: [id number] DOB/Age: 1929/09/16 79 y.o. Physician: Lynnea Maizes, M.D. 2 Days Post-Op Procedure(s) (LRB): CANNULATED HIP PINNING-  left (Left)  Subjective: Reports "sick spell" this am, feeling better now. On bedside commode Vital Signs Temp:  [97.8 F (36.6 C)-98.9 F (37.2 C)] 97.8 F (36.6 C) (06/18 0901) Pulse Rate:  [68-82] 70 (06/18 0901) Resp:  [16-18] 18 (06/18 0901) BP: (95-127)/(55-70) 126/70 mmHg (06/18 0901) SpO2:  [100 %] 100 % (06/18 0901)  Lab Results  Recent Labs  01/20/13 0540 01/21/13 0455  WBC 4.7 4.4  HGB 10.3* 10.6*  HCT 31.5* 32.6*  PLT 129* 121*   BMET  Recent Labs  01/20/13 0540 01/21/13 0455  NA 137 134*  K 3.7 3.5  CL 101 98  CO2 28 29  GLUCOSE 118* 103*  BUN 6 9  CREATININE 0.73 0.72  CALCIUM 8.3* 8.6   INR  Date Value Range Status  01/18/2013 1.68* 0.00 - 1.49 Final     Exam  Able to sit on commode without c/o hip pain  Plan Continue 50% TDWB LLE, maintain chronic anticoagulation regimen, f/u my office 3 weeks from surgery Keontay Vora M 01/21/2013, 1:28 PM

## 2013-01-21 NOTE — Progress Notes (Signed)
Clinical social worker assisted with patient discharge to skilled nursing facility, [facility].  CSW addressed all family questions and concerns. CSW copied chart and added all important documents. CSW also set up patient transportation with [employer]. Clinical Social Worker will sign off for now as social work intervention is no longer needed.   Sabino Niemann, MSW, [phone]

## 2013-01-22 ENCOUNTER — Other Ambulatory Visit: Payer: Self-pay | Admitting: Geriatric Medicine

## 2013-01-22 ENCOUNTER — Non-Acute Institutional Stay (SKILLED_NURSING_FACILITY): Payer: Medicare HMO | Admitting: Internal Medicine

## 2013-01-22 DIAGNOSIS — I82409 Acute embolism and thrombosis of unspecified deep veins of unspecified lower extremity: Secondary | ICD-10-CM

## 2013-01-22 DIAGNOSIS — C50919 Malignant neoplasm of unspecified site of unspecified female breast: Secondary | ICD-10-CM

## 2013-01-22 DIAGNOSIS — C50912 Malignant neoplasm of unspecified site of left female breast: Secondary | ICD-10-CM

## 2013-01-22 DIAGNOSIS — I10 Essential (primary) hypertension: Secondary | ICD-10-CM

## 2013-01-22 DIAGNOSIS — R42 Dizziness and giddiness: Secondary | ICD-10-CM

## 2013-01-22 DIAGNOSIS — I82402 Acute embolism and thrombosis of unspecified deep veins of left lower extremity: Secondary | ICD-10-CM

## 2013-01-22 DIAGNOSIS — S72009D Fracture of unspecified part of neck of unspecified femur, subsequent encounter for closed fracture with routine healing: Secondary | ICD-10-CM

## 2013-01-22 DIAGNOSIS — D649 Anemia, unspecified: Secondary | ICD-10-CM

## 2013-01-22 DIAGNOSIS — I2699 Other pulmonary embolism without acute cor pulmonale: Secondary | ICD-10-CM

## 2013-01-22 DIAGNOSIS — S72002D Fracture of unspecified part of neck of left femur, subsequent encounter for closed fracture with routine healing: Secondary | ICD-10-CM

## 2013-01-22 DIAGNOSIS — E039 Hypothyroidism, unspecified: Secondary | ICD-10-CM

## 2013-01-22 MED ORDER — HYDROCODONE-ACETAMINOPHEN 5-325 MG PO TABS: 1.0000 | ORAL_TABLET | Freq: Four times a day (QID) | ORAL | Status: DC | PRN

## 2013-01-22 NOTE — Progress Notes (Signed)
Patient ID: Lisa Klein, female   DOB: 02/26/1930, 79 y.o.   MRN: [id number]  This is an acute visit  Care skilled  Facility [facility]  Chief complaint acute visit status post hospitalization for left hip fracture  History of present illness  Patient is a very pleasant 79 year old female with a history of pulmonary embolism and DVT-she is on Cymbalta-also a history of breast cancer hypothyroidism hypertension.  She sustained a fall at her home and subsequently was diagnosed with a left hip fracture  Apparently tolerated the procedure well and is here for rehabilitation.  Today she has no acute complaints other than feel she's constipated she also said earlier today he had some dizziness this apparently is not new she does have a history of vertigo she says it is better this evening-it appears she does have a history of intermittent vertigo.  She denies any chest pain or shortness of breath.  Previous medical history.  History of left hip fracture status post repair.  Hypothyroidism.  Hypertension.  Infiltrating ductal carcinoma of breast.  History of pulmonary embolism.  History of DVT.  History hyperlipidemia.  Gen. joint disease.  Degenerative disc disease with nerve compression.  Past surgical history.   vesicovaginal fistula closure   Cholecystectomy.  Mastectomy modified radical left breast.   Hospital studies .  01/18/2013.  Left hip x-ray showed a mild impacted fracture of the left femoral neck.  Subsequent x-ray 01/19/2013 showed open reduction internal fixation proximal left femur.  01/18/2013-knee x-ray left-no acute fracture or subluxation there are degenerative changes.  Medications.  Tylenol 650 mg daily when necessary.  Arimidex 1 mg each bedtime.  Aspirin enteric-coated 81 mg daily.  Colace 100 mg daily when necessary.  Hydrochlorothiazide 12.5 mg every morning.  Vicodin 5/325 mg every 6 hours when necessary.  Synthroid 100 mcg  every morning.  Mevacor 20 mg each bedtime.  With vitamin D one tablet twice a day.  Potassium 10 mEq twice a day.  Vitamin D 600 mg daily.  Xeralto-20 mg daily.  Social history.  Patient states she lives by herself her home does have about 4 or 5 steps.  She has never used tobacco products also denies any alcohol or illicit drug use.  Family history.  Her father had a history of COPD and lung disease.  Sr. with a history of hypertension.  Review of systems.  In general she denies any fever or chills.  Skin-denies issues itching or rash.  Head ears eyes nose mouth and throat-does not complaining of any sore throat nasal discharge or visual changes  Respiratory-no complaints of shortness of breath or cough.  Cardiac-denies any chest pain.  GI-does complain of constipation says she has not had a bowel movement in several days-denies any abdominal pain nausea or vomiting.  Muscle skeletal-does not complaining of pain apparently pain in the hip is fairly well controlled right now.  Neurologic-said she had a headache and some dizziness earlier today but this is improved this evening-it appears she has some history of chronic vertigo.  Psych-denies issues.  Physical exam.  Temperature 98.0 pulse 68 respirations 20 blood pressure 120/68.  In general this is a very pleasant elderly female who looks younger than her stated age.  Her skin is warm and dry.  Eyes pupils are equal round reactive to light sclera and conjunctiva are clear visual acuity appears grossly intact.  Extraocular movements are intact.  Oropharynx is clear mucous membranes moist she does have numerous extractions.  Trudee Grip has  partial mastectomy left breast well-healed surgical scar-right breast appears to be within normal limits  Chest is clear to auscultation bilaterally without rhonchi rales or wheezes-no labored breathing.  Abdomen is soft somewhat obese nontender with positive bowel  sounds.  Muscle skeletal  Surgical site on left hip appears benign looking there was some crusting there is no active drainage bleeding or discharge--or surrounding erythema.  She does have arthritic changes of her knees bilaterally-she does not really have significant lower extremity edema and pedal pulses are intact bilaterally.  Grip strength is intact she does have limited range of motion of her left leg secondary to the surgery.  Neurologic grossly intact cranial nerves are intact I do not see any lateralizing findings or deficiencies.  Psychiatric she is alert and oriented x3 pleasant and appropriate.  Labs.  01/22/2012.  WBC 4.4 hemoglobin 10.6 platelets 121.  Sodium 134 potassium 3.5 BUN 9 creatinine 0.72.  01/21/2012.  WBC 4.7 hemoglobin 10.3 platelets 129.  Sodium 137 potassium 3.7 BUN 6 creatinine 0.73.  01/20/2012.  TSH-2.273.  Liver function tests within normal limits except albumin of 2.8    . Assessment and plan.  Left hip fracture-continue PT and OT as tolerated she continues onXeralto anticoagulation with a history of previous DVT and pulmonary embolism--also continues on calcium supplementation.  At this point her pain appears to be controlled she does have Tylenol as well as Vicodin available.  #2 hypertension-at this point appear stable she is on hydrochlorothiazide.  #3 thyroidism on Synthroid recent TSH was within normal limits.  #4-history of breast cancer-this is followed by oncology she is on Arimidex  #5-constipation-we'll make Colace routine twice a day and add MiraLax daily hold for diarrhea continue to monitor.  #6-history of vertigo per review of records-consider Antivert if this is more persistent she appears to be doing better this evening.  #7 history of pulmonary embolism and DVT-again she does continue on  Xeralto-she says she was on Coumadin at one point but preferred to have something that did not require constant monitoring and   she was switched to [place]  #8-hyperlipidemia she continues on Mevacor-liver function tests I note in hospital were essentially within normal limits-will defer aggressive workup of this secondary to expected short stay here if she stays longer y will pursue a lipid panel  #9-history of mild hypokalemia-this is being supplemented will update metabolic panel first laboratory date next week  Anemia-this appears to be relatively mild we'll update CBC first laboratory date next week  [id number]-of note more than 50 minutes spent assessing patient-revieiwing hospital record -f and formulating a plan of care for numerous diagnoses                .  [name]

## 2013-01-22 NOTE — Care Management Note (Signed)
    Page 1 of 1   01/22/2013     8:44:24 AM   CARE MANAGEMENT NOTE 01/22/2013  Patient:  Lisa Klein, Lisa Klein   Account Number:  [id number]  Date Initiated:  01/22/2013  Documentation initiated by:  [facility]  Subjective/Objective Assessment:   admitted with left femoral neck fx     Action/Plan:   Pt/OT recommended SNF   Anticipated DC Date:     Anticipated DC Plan:    In-house referral  Clinical Social Worker      DC Planning Services  CM consult      Choice offered to / List presented to:             Status of service:  Completed, signed off Medicare Important Message given?   (If response is "NO", the following Medicare IM given date fields will be blank) Date Medicare IM given:   Date Additional Medicare IM given:    Discharge Disposition:  SKILLED NURSING FACILITY  Per UR Regulation:  Reviewed for med. necessity/level of care/duration of stay  If discussed at Long Length of Stay Meetings, dates discussed:    Comments:

## 2013-02-13 ENCOUNTER — Ambulatory Visit: Payer: Medicare HMO | Admitting: Family Medicine

## 2013-03-17 ENCOUNTER — Non-Acute Institutional Stay (SKILLED_NURSING_FACILITY): Payer: Medicare HMO | Admitting: Internal Medicine

## 2013-03-17 DIAGNOSIS — C50919 Malignant neoplasm of unspecified site of unspecified female breast: Secondary | ICD-10-CM

## 2013-03-17 DIAGNOSIS — I82409 Acute embolism and thrombosis of unspecified deep veins of unspecified lower extremity: Secondary | ICD-10-CM

## 2013-03-17 DIAGNOSIS — S72009D Fracture of unspecified part of neck of unspecified femur, subsequent encounter for closed fracture with routine healing: Secondary | ICD-10-CM

## 2013-03-17 DIAGNOSIS — S72002F Fracture of unspecified part of neck of left femur, subsequent encounter for open fracture type IIIA, IIIB, or IIIC with routine healing: Secondary | ICD-10-CM

## 2013-03-17 DIAGNOSIS — I2699 Other pulmonary embolism without acute cor pulmonale: Secondary | ICD-10-CM

## 2013-03-17 DIAGNOSIS — E039 Hypothyroidism, unspecified: Secondary | ICD-10-CM

## 2013-03-17 DIAGNOSIS — E785 Hyperlipidemia, unspecified: Secondary | ICD-10-CM

## 2013-03-17 DIAGNOSIS — I10 Essential (primary) hypertension: Secondary | ICD-10-CM

## 2013-03-17 NOTE — Progress Notes (Signed)
Patient ID: Lisa Klein, female   DOB: Jul 11, 1930, 79 y.o.   MRN: [id number] This is a discharge note Care skilled  Facility [facility]   Date 03/17/2013   Chief complaint--discharge note    History of present illness  Patient is a very pleasant 79 year old female with a history of pulmonary embolism and DVT-she is on Cymbalta-also a history of breast cancer hypothyroidism hypertension.  She sustained a fall at her home and subsequently was diagnosed with a left hip fracture  Apparently tolerated the procedure well and is here for rehabilitation.  Her stay here has been relatively unremarkable-he has progressed well with physical therapy and will be going home she will need continued therapy and home health support-she will be with her sister.     She denies any chest pain or shortness of breath. Her hip pain appears to be well controlled.  She does have a history of hypothyroidism TSH we got him facilities within normal limits at 2.273.  She also has a history of hypertension she is on hydrochlorothiazide recent blood pressures 139/64 119/66.  In regards to her history pulmonary embolism and DVT this has been stable she does continue on Xeralto   Previous medical history.  History of left hip fracture status post repair.  Hypothyroidism.  Hypertension.  Infiltrating ductal carcinoma of breast.  History of pulmonary embolism.  History of DVT.  History hyperlipidemia.  Gen. joint disease.  Degenerative disc disease with nerve compression .  Past surgical history.  vesicovaginal fistula closure  Cholecystectomy.  Mastectomy modified radical left breast .  Hospital studies .  01/18/2013.  Left hip x-ray showed a mild impacted fracture of the left femoral neck.  Subsequent x-ray 01/19/2013 showed open reduction internal fixation proximal left femur.  01/18/2013-knee x-ray left-no acute fracture or subluxation there are degenerative changes.  Medications.  Tylenol 650 mg  daily when necessary.  Arimidex 1 mg each bedtime.  Aspirin enteric-coated 81 mg daily.  Colace 100 mg daily when necessary.  Hydrochlorothiazide 12.5 mg every morning.  Vicodin 5/325 mg every 6 hours when necessary.  Synthroid 100 mcg every morning.  Mevacor 20 mg each bedtime.  With vitamin D one tablet twice a day.  Potassium 10 mEq twice a day.  Vitamin D 600 mg daily.  Xeralto-20 mg daily.   Social history.  Patient states she lived by herself her home does have about 4 or 5 steps.--She will be living with her sister for a while  She has never used tobacco products also denies any alcohol or illicit drug use .  Family history.  Her father had a history of COPD and lung disease.  Sr. with a history of hypertension .  Review of systems.  In general she denies any fever or chills.  Skin-denies issues itching or rash.  Head ears eyes nose mouth and throat-does not complaining of any sore throat nasal discharge or visual changes  Respiratory-no complaints of shortness of breath or cough.  Cardiac-denies any chest pain.  GI-d Had complained of constipation earlier in the state apparently this has been resolved she is on MiraLax as well as Colace.  Muscle skeletal-does not complaining of pain apparently pain in the hip is fairly well controlled right now.  Neurologic-some history of chronic vertigo but that has not really been an issue during her stay here o.  Psych-denies issues.   Physical exam.  Temperature 98.4 pulse 98 respirations 20 blood pressure 139/64 weight is 187.4  In general this is a  very pleasant elderly female who looks younger than her stated age.  Her skin is warm and dry.  Eyes pupils are equal round reactive to light sclera and conjunctiva are clear visual acuity appears grossly intact.  Extraocular movements are intact.  Oropharynx is clear mucous membranes moist she does have numerous extractions.  Breasts-she has partial mastectomy left breast  well-healed surgical scar-right breast appears to be within normal limits  Chest is clear to auscultation bilaterally without rhonchi rales or wheezes-no labored breathing.  Abdomen is soft somewhat obese nontender with positive bowel sounds.  Muscle skeletal  Surgical site on left hip appears benign this appears to be healed-she does walk fairly well with a rolling walker .  She does have arthritic changes of her knees bilaterally-she mild  lower extremity edema and pedal pulses are intact bilaterally.  Grip strength is intact   Neurologic grossly intact cranial nerves are intact I do not see any lateralizing findings or deficiencies.  Psychiatric she is alert and oriented x3 pleasant and appropriate .  Labs 01/26/2013.  WBC 4.7 hemoglobin 10.5 platelets 201.  Sodium 140 potassium 3.8 BUN 14 creatinine 0.81.  .  01/22/2012.  WBC 4.4 hemoglobin 10.6 platelets 121.  Sodium 134 potassium 3.5 BUN 9 creatinine 0.72.  01/21/2012.  WBC 4.7 hemoglobin 10.3 platelets 129.  Sodium 137 potassium 3.7 BUN 6 creatinine 0.73.  01/20/2012.  TSH-2.273.  Liver function tests within normal limits except albumin of 2.8   . Assessment and plan.  Left hip fracture-continue PT and OT as tolerated she continues onXeralto anticoagulation with a history of previous DVT and pulmonary embolism--also continues on calcium supplementation.  At this point her pain appears to be controlled she does have Tylenol as well as Vicodin available--will need continued PTOT and home health support upon discharge.  #2 hypertension-at this point appear stable she is on hydrochlorothiazide. Update BMP and notify PCP of results  #3 itHypo thyroidism on Synthroid recent TSH was within normal limits.  #4-history of breast cancer-this is followed by oncology she is on Arimidex  #5-constipation- This apparently stabilized on MiraLax and Colace.  #6-history of vertigo per review of records-earlier in her stay we considered  Antivert but it hasn't really been necessary.  #7 history of pulmonary embolism and DVT-again she does continue on Xeralto-she says she was on Coumadin at one point but preferred to have something that did not require constant monitoring and she was switched to [place]  #8-hyperlipidemia she continues on Mevacor-liver function tests I note in hospital were essentially within normal limits-will defer aggressive workup of this secondary to  short stay here if she stays longer  will pursue a lipid panel  #9-history of mild hypokalemia-this is being supplemented will update metabolic panel first laboratory date next week  Anemia-this appears to be relatively mild we'll update CBC --notify primary care provider a result.  Of note she does have followup with her primary care provider in approximately one week   [id number]--of note greater than 30 minutes was spent on this discharge summary including assessing patient-discussing her status with nursing staff-- reviewing her records-and formulating a plan of care for numerous diagnoses

## 2013-03-23 ENCOUNTER — Other Ambulatory Visit (HOSPITAL_COMMUNITY): Payer: Medicare HMO

## 2013-03-24 ENCOUNTER — Telehealth: Payer: Self-pay | Admitting: Family Medicine

## 2013-03-24 NOTE — Telephone Encounter (Signed)
Authorization given

## 2013-03-25 ENCOUNTER — Ambulatory Visit (HOSPITAL_COMMUNITY): Payer: Medicare HMO | Admitting: Oncology

## 2013-03-25 ENCOUNTER — Ambulatory Visit (INDEPENDENT_AMBULATORY_CARE_PROVIDER_SITE_OTHER): Payer: Medicare HMO | Admitting: Family Medicine

## 2013-03-25 ENCOUNTER — Encounter: Payer: Self-pay | Admitting: Family Medicine

## 2013-03-25 VITALS — BP 120/74 | HR 81 | Resp 16 | Ht 66.0 in | Wt 185.0 lb

## 2013-03-25 DIAGNOSIS — E669 Obesity, unspecified: Secondary | ICD-10-CM

## 2013-03-25 DIAGNOSIS — I10 Essential (primary) hypertension: Secondary | ICD-10-CM

## 2013-03-25 DIAGNOSIS — I2699 Other pulmonary embolism without acute cor pulmonale: Secondary | ICD-10-CM

## 2013-03-25 DIAGNOSIS — IMO0001 Reserved for inherently not codable concepts without codable children: Secondary | ICD-10-CM

## 2013-03-25 DIAGNOSIS — R5381 Other malaise: Secondary | ICD-10-CM

## 2013-03-25 DIAGNOSIS — E785 Hyperlipidemia, unspecified: Secondary | ICD-10-CM

## 2013-03-25 DIAGNOSIS — S72009D Fracture of unspecified part of neck of unspecified femur, subsequent encounter for closed fracture with routine healing: Secondary | ICD-10-CM

## 2013-03-25 DIAGNOSIS — E039 Hypothyroidism, unspecified: Secondary | ICD-10-CM

## 2013-03-25 NOTE — Progress Notes (Signed)
  Subjective:    Patient ID: Lisa Klein, female    DOB: Dec 13, 1929, 79 y.o.   MRN: [id number]  HPI Pt in for follow up  Since her last visit , she unfortunately fractured her hip, had surgical intervention ,and spent 8 weeks in rehab at the [facility] center, just being released last week. She states she feels well, ambulates  Well , but still needs a walker which she should have had at thte time of her discharge Home health is clearly also currently following her due to nature of her most recent hospitalization,a dn she does remain a "high risk" patient , she has had approx 3 hospitalizations in the past 12 to 18 months, with a new dx of breast cancer, followed by a PE , and now hip fracture. Despite all of this, her spirits are definitely good and she has the support of her sibling who she is currently staying with, but she is anxious to get back to her own home   Review of Systems See HPI Denies recent fever or chills. Denies sinus pressure, nasal congestion, ear pain or sore throat. Denies chest congestion, productive cough or wheezing. Denies chest pains, palpitations and leg swelling Denies abdominal pain, nausea, vomiting,diarrhea or constipation.   Denies dysuria, frequency, hesitancy or incontinence.  Denies headaches, seizures, numbness, or tingling. Denies depression, anxiety or insomnia. Denies skin break down or rash.        Objective:   Physical Exam  Patient alert and oriented and in no cardiopulmonary distress.  HEENT: No facial asymmetry, EOMI, no sinus tenderness,  oropharynx pink and moist.  Neck decreased though adeqaute ROM, no adenopathy.  Chest: Clear to auscultation bilaterally.  CVS: S1, S2 no murmurs, no S3.  ABD: Soft non tender.   Ext: No edema  MS: decreased  ROM spine,  hips and knees.  Skin: Intact, no ulcerations or rash noted.Surgical wound of left hip well healed, no erythema or drainage  Psych: Good eye contact, normal affect. Memory  intact not anxious or depressed appearing.  CNS: CN 2-12 intact, power, tone and sensation normal throughout.       Assessment & Plan:

## 2013-03-25 NOTE — Patient Instructions (Addendum)
F/U in 3.5 month, call if you need me before  I will try to obtain a walker for you, since you need this for safe mobility.   Fasting lipid, cmp and TSH in 3.5 month, and also a CBc  Please try and get a life line for your safety

## 2013-03-29 ENCOUNTER — Encounter: Payer: Self-pay | Admitting: Family Medicine

## 2013-03-29 NOTE — Assessment & Plan Note (Signed)
Currently on xarelto, length of therapy to be decided in the future. Fortunately no bleed with recent hip fracture

## 2013-03-29 NOTE — Assessment & Plan Note (Signed)
Controlled, updated lab next visit

## 2013-03-29 NOTE — Assessment & Plan Note (Signed)
Controlled, no change in medication  

## 2013-03-29 NOTE — Assessment & Plan Note (Addendum)
Updated lab for next  Visit. :Low fat diet discussed and encouraged.

## 2013-04-01 ENCOUNTER — Encounter: Payer: Self-pay | Admitting: Family Medicine

## 2013-04-16 ENCOUNTER — Telehealth: Payer: Self-pay | Admitting: Family Medicine

## 2013-04-17 ENCOUNTER — Telehealth: Payer: Self-pay | Admitting: Family Medicine

## 2013-04-17 DIAGNOSIS — Z13820 Encounter for screening for osteoporosis: Secondary | ICD-10-CM

## 2013-04-17 DIAGNOSIS — Z8781 Personal history of (healed) traumatic fracture: Secondary | ICD-10-CM

## 2013-04-17 NOTE — Telephone Encounter (Signed)
Pls contact pt and let her know tat due to recent fracture she needs a bone density scan. I am entering the referral, so pls set up an appt. And let her know

## 2013-04-20 ENCOUNTER — Telehealth: Payer: Self-pay | Admitting: Family Medicine

## 2013-04-21 NOTE — Telephone Encounter (Signed)
Patient is aware 

## 2013-04-22 NOTE — Telephone Encounter (Signed)
Patient has a bone density scheduled for 9.30.2014

## 2013-04-24 ENCOUNTER — Other Ambulatory Visit (HOSPITAL_COMMUNITY): Payer: Self-pay | Admitting: Oncology

## 2013-04-24 ENCOUNTER — Encounter (HOSPITAL_COMMUNITY): Payer: Self-pay | Admitting: Oncology

## 2013-04-24 ENCOUNTER — Encounter (HOSPITAL_COMMUNITY): Payer: Medicare HMO | Attending: Oncology | Admitting: Oncology

## 2013-04-24 VITALS — BP 138/77 | HR 92 | Temp 97.8°F | Resp 18 | Wt 185.0 lb

## 2013-04-24 DIAGNOSIS — C50912 Malignant neoplasm of unspecified site of left female breast: Secondary | ICD-10-CM

## 2013-04-24 DIAGNOSIS — Z139 Encounter for screening, unspecified: Secondary | ICD-10-CM

## 2013-04-24 DIAGNOSIS — C50919 Malignant neoplasm of unspecified site of unspecified female breast: Secondary | ICD-10-CM

## 2013-04-24 NOTE — Patient Instructions (Addendum)
.  [facility] Discharge Instructions  RECOMMENDATIONS MADE BY THE CONSULTANT AND ANY TEST RESULTS WILL BE SENT TO YOUR REFERRING PHYSICIAN.  EXAM FINDINGS BY THE PHYSICIAN TODAY AND SIGNS OR SYMPTOMS TO REPORT TO CLINIC OR PRIMARY PHYSICIAN: Exam and findings as discussed by Jenita Seashore PA.  Mammogram next week INSTRUCTIONS/FOLLOW-UP: Labs in 3 months 3 months to see MD  Thank you for choosing [facility] to provide your oncology and hematology care.  To afford each patient quality time with our providers, please arrive at least 15 minutes before your scheduled appointment time.  With your help, our goal is to use those 15 minutes to complete the necessary work-up to ensure our physicians have the information they need to help with your evaluation and healthcare recommendations.    Effective January 1st, 2014, we ask that you re-schedule your appointment with our physicians should you arrive 10 or more minutes late for your appointment.  We strive to give you quality time with our providers, and arriving late affects you and other patients whose appointments are after yours.    Again, thank you for choosing [facility].  Our hope is that these requests will decrease the amount of time that you wait before being seen by our physicians.       _____________________________________________________________  Should you have questions after your visit to [facility], please contact our office at [phone] between the hours of 8:30 a.m. and 5:00 p.m.  Voicemails left after 4:30 p.m. will not be returned until the following business day.  For prescription refill requests, have your pharmacy contact our office with your prescription refill request.

## 2013-04-24 NOTE — Progress Notes (Signed)
Lisa Overman, MD [address], [address] [place] [address]  Infiltrating ductal carcinoma of breast, left  CURRENT THERAPY: Arimidex daily beginning on 11/19/2011 which she will take for 5 years.  Xarelto anticoagulation which she will take while she is on Arimidex.  INTERVAL HISTORY: Lisa Klein 79 y.o. female returns for  regular  visit for followup of stage IIIa adenocarcinoma the left breast status post modified radical mastectomy in 02/14/2011 followed by 6 cycles of epirubicin and Cytoxan with significant dose reductions followed by radiation therapy. She was then placed on Arimidex. She'll take that for 5 years. This was a grade 3 cancer. She had multifocal invasive disease. The largest focus was 1.5 cm. She did have involving the deep surgical margin of resection. She had 6 of 7 positive nodes involved and axilla. ER +100%, PR +14%, Ki-67 are 45%, HER-2/neu nonamplified. She started Arimidex on 11/19/2011. Thus far she has no evidence recurrent disease with a negative PET scan in April 2014.   Unfortunately, Lisa Klein had a left hip fracture requiring fixation which was performed on 01/19/2013 by Dr. Francena Hanly. She reports that she fell on a Friday.  The following day she made breakfast and it sounds like she was fairly active the day following the fall, but she was in excruciating pain on Sunday.  She has an upcoming bone density test coming up in 11 days which she should have done every 2 years due to her age and AI therapy.   She is on Xarelto and I provided her education regarding this medication.  She was educated on safety at home as to prevent falling.  She is overdue for her next screening mammogram which was due in July 2014.  I suspect she missed this important screening test secondary to er hip fracture.   She relays one story to me that she felt "funny" after breakfast after having a sip of hot coffee.  She report that she felt dizzy and became diaphoretic.   She denies any chest pain.  I suspect this was a vagal response.  It never happened since and she reports that it resolved spontaneously.  Oncologically she is doing well.  ROS questioning is negative.   Past Medical History  Diagnosis Date  . DJD (degenerative joint disease)   . Hypertension   . Hypothyroidism   . Obesity   . Degenerative disc disease     with nerve compression   . Hypothyroidism   . Hypertension   . Clotting disorder 05/2011    dvt, right leg  . Cancer 2012    LEFT BREAST    has HYPOTHYROIDISM; HYPERLIPIDEMIA; OBESITY; HYPERTENSION; DEGENERATIVE JOINT DISEASE; Infiltrating ductal carcinoma of breast; DVT (deep venous thrombosis); Routine general medical examination at a health care facility; Pulmonary embolism; and Hip fracture, left on her problem list.     has No Known Allergies.  Lisa Klein does not currently have medications on file.  Past Surgical History  Procedure Laterality Date  . Vesicovaginal fistula closure w/ tah      [facility]  . Cholecystectomy  80'S    [facility]  . Mastectomy modified radical  02/14/11    left  . Portacath placement  04/16/2011    Procedure: INSERTION PORT-A-CATH;  Surgeon: Dalia Heading;  Location: [facility];  Service: General;  Laterality: Right;  right subclavian  . Cholecystectomy    . Breast surgery      left total mastectomy  . Port-a-cath removal  N/A 12/05/2012    Procedure: MINOR REMOVAL PORT-A-CATH;  Surgeon: Dalia Heading, MD;  Location: [facility];  Service: General;  Laterality: N/A;  In Minor Room  . Hip pinning,cannulated Left 01/19/2013    Procedure: CANNULATED HIP PINNING-  left;  Surgeon: Senaida Lange, MD;  Location: [facility] OR;  Service: Orthopedics;  Laterality: Left;    Denies any headaches, dizziness, double vision, fevers, chills, night sweats, nausea, vomiting, diarrhea, constipation, chest pain, heart palpitations, shortness of breath, blood in stool, black tarry stool, urinary pain, urinary burning, urinary frequency,  hematuria.   PHYSICAL EXAMINATION  ECOG PERFORMANCE STATUS: 2 - Symptomatic, <50% confined to bed  Filed Vitals:   04/24/13 1421  BP: 138/77  Pulse: 92  Temp: 97.8 F (36.6 C)  Resp: 18    GENERAL:alert, no distress, well nourished, well developed, comfortable, cooperative and obese SKIN: skin color, texture, turgor are normal, no rashes or significant lesions HEAD: Normocephalic, No masses, lesions, tenderness or abnormalities EYES: normal, PERRLA, EOMI, Conjunctiva are pink and non-injected EARS: External ears normal OROPHARYNX:mucous membranes are moist  NECK: supple, no adenopathy, thyroid normal size, non-tender, without nodularity, no stridor, non-tender, trachea midline LYMPH:  no palpable lymphadenopathy, no hepatosplenomegaly BREAST:right breast normal without mass, skin or nipple changes or axillary nodes, left post-mastectomy site well healed and free of suspicious changes LUNGS: clear to auscultation and percussion HEART: regular rate & rhythm, no murmurs, no gallops, S1 normal and S2 normal ABDOMEN:abdomen soft, non-tender, normal bowel sounds, no masses or organomegaly and no hepatosplenomegaly BACK: Back symmetric, no curvature., No CVA tenderness EXTREMITIES:less then 2 second capillary refill, no joint deformities, effusion, or inflammation, no edema, no skin discoloration, no clubbing, no cyanosis  NEURO: alert & oriented x 3 with fluent speech, no focal motor/sensory deficits, gait normal    LABORATORY DATA: CBC    Component Value Date/Time   WBC 4.4 01/21/2013 0455   RBC 3.74* 01/21/2013 0455   HGB 10.6* 01/21/2013 0455   HCT 32.6* 01/21/2013 0455   PLT 121* 01/21/2013 0455   MCV 87.2 01/21/2013 0455   MCH 28.3 01/21/2013 0455   MCHC 32.5 01/21/2013 0455   RDW 14.7 01/21/2013 0455   LYMPHSABS 1.0 01/19/2013 0805   MONOABS 0.6 01/19/2013 0805   EOSABS 0.2 01/19/2013 0805   BASOSABS 0.0 01/19/2013 0805       Chemistry      Component Value Date/Time   NA  134* 01/21/2013 0455   K 3.5 01/21/2013 0455   CL 98 01/21/2013 0455   CO2 29 01/21/2013 0455   BUN 9 01/21/2013 0455   CREATININE 0.72 01/21/2013 0455   CREATININE 0.92 01/14/2013 1000      Component Value Date/Time   CALCIUM 8.6 01/21/2013 0455   ALKPHOS 60 01/19/2013 0805   AST 29 01/19/2013 0805   ALT 21 01/19/2013 0805   BILITOT 0.8 01/19/2013 0805        ASSESSMENT:  1. Stage IIIa adenocarcinoma the left breast status post modified radical mastectomy in 02/14/2011 followed by 6 cycles of epirubicin and Cytoxan with significant dose reductions followed by radiation therapy. She was then placed on Arimidex. She'll take that for 5 years. This was a grade 3 cancer. She had multifocal invasive disease. The largest focus was 1.5 cm. She did have involving the deep surgical margin of resection. She had 6 of 7 positive nodes involved and axilla. ER +100%, PR +14%, Ki-67 are 45%, HER-2/neu nonamplified. She started Arimidex on 11/19/2011. Thus far she  has no evidence recurrent disease with a negative PET scan in April 2014. 2. Left hip fracture requiring fixation on 01/20/2013 by Dr. Francena Hanly. 3. B/L DVT complicated by PE, on Xarelto 20 mg daily which she will take as long as she is on Arimidex.  Patient Active Problem List   Diagnosis Date Noted  . Hip fracture, left 01/18/2013  . Pulmonary embolism 10/17/2012  . Routine general medical examination at a health care facility 02/26/2012  . DVT (deep venous thrombosis) 05/10/2011  . Infiltrating ductal carcinoma of breast 04/10/2011  . HYPERLIPIDEMIA 11/07/2007  . HYPOTHYROIDISM 08/15/2007  . OBESITY 08/15/2007  . HYPERTENSION 08/15/2007  . DEGENERATIVE JOINT DISEASE 08/15/2007     PLAN:  1. I personally reviewed and went over laboratory results with the patient. 2. I personally reviewed and went over radiographic studies with the patient. 3. She is overdue for her next screening mammogram and therefore our staff has scheduled her one next  week. 4. Bone density as scheduled on 05/05/2013 5. Labs in 3 months: CBC diff, CMET 6. Patient education regarding Xarelto 7. Return in 3 months for follow-up.   THERAPY PLAN:  She will continue with Arimidex daily.  She is about 18 months into this 5 year treatment plan.  She is to have screening mammograms annually with bone density examinations every 2 years.  While on Arimidex, she will be anticoagulated with Xarelto 20 mg daily.  When Arimidex therapy is completed, will also D/C Xarelto unless further risk factors tip the risk/benefit ratio into the risk side.  All questions were answered. The patient knows to call the clinic with any problems, questions or concerns. We can certainly see the patient much sooner if necessary.  Patient and plan discussed with Dr. Alla German and he is in agreement with the aforementioned.   Lisa Klein

## 2013-04-30 ENCOUNTER — Ambulatory Visit (HOSPITAL_COMMUNITY)
Admission: RE | Admit: 2013-04-30 | Discharge: 2013-04-30 | Disposition: A | Discharge status: Home / Self Care | Payer: Medicare HMO | Source: Ambulatory Visit | Attending: Oncology | Admitting: Oncology

## 2013-04-30 DIAGNOSIS — Z853 Personal history of malignant neoplasm of breast: Secondary | ICD-10-CM | POA: Insufficient documentation

## 2013-04-30 DIAGNOSIS — Z139 Encounter for screening, unspecified: Secondary | ICD-10-CM

## 2013-05-05 ENCOUNTER — Ambulatory Visit (HOSPITAL_COMMUNITY)
Admission: RE | Admit: 2013-05-05 | Discharge: 2013-05-05 | Disposition: A | Discharge status: Home / Self Care | Payer: Medicare HMO | Source: Ambulatory Visit | Attending: Family Medicine | Admitting: Family Medicine

## 2013-05-05 DIAGNOSIS — Z8781 Personal history of (healed) traumatic fracture: Secondary | ICD-10-CM

## 2013-05-05 DIAGNOSIS — M899 Disorder of bone, unspecified: Secondary | ICD-10-CM | POA: Insufficient documentation

## 2013-05-05 DIAGNOSIS — Z13820 Encounter for screening for osteoporosis: Secondary | ICD-10-CM

## 2013-05-06 ENCOUNTER — Other Ambulatory Visit: Payer: Self-pay | Admitting: Family Medicine

## 2013-05-06 MED ORDER — RISEDRONATE SODIUM 35 MG PO TABS: 35.0000 mg | ORAL_TABLET | ORAL | Status: DC

## 2013-05-07 ENCOUNTER — Other Ambulatory Visit (HOSPITAL_BASED_OUTPATIENT_CLINIC_OR_DEPARTMENT_OTHER): Payer: Self-pay | Admitting: Internal Medicine

## 2013-05-08 ENCOUNTER — Telehealth: Payer: Self-pay | Admitting: Family Medicine

## 2013-05-08 ENCOUNTER — Other Ambulatory Visit (HOSPITAL_COMMUNITY): Payer: Self-pay | Admitting: Oncology

## 2013-05-08 DIAGNOSIS — I2699 Other pulmonary embolism without acute cor pulmonale: Secondary | ICD-10-CM

## 2013-05-08 MED ORDER — RIVAROXABAN 20 MG PO TABS: 20.0000 mg | ORAL_TABLET | Freq: Every day | ORAL | Status: DC

## 2013-05-08 NOTE — Telephone Encounter (Signed)
Advised that this med comes from [facility] and to call their office for refill and gave number

## 2013-06-29 ENCOUNTER — Encounter: Payer: Self-pay | Admitting: Family Medicine

## 2013-06-29 ENCOUNTER — Ambulatory Visit (INDEPENDENT_AMBULATORY_CARE_PROVIDER_SITE_OTHER): Payer: Medicare HMO | Admitting: Family Medicine

## 2013-06-29 VITALS — BP 120/76 | HR 82 | Resp 18 | Ht 66.0 in | Wt 184.1 lb

## 2013-06-29 DIAGNOSIS — I10 Essential (primary) hypertension: Secondary | ICD-10-CM

## 2013-06-29 DIAGNOSIS — I2699 Other pulmonary embolism without acute cor pulmonale: Secondary | ICD-10-CM

## 2013-06-29 DIAGNOSIS — E039 Hypothyroidism, unspecified: Secondary | ICD-10-CM

## 2013-06-29 DIAGNOSIS — Z23 Encounter for immunization: Secondary | ICD-10-CM

## 2013-06-29 DIAGNOSIS — M1990 Unspecified osteoarthritis, unspecified site: Secondary | ICD-10-CM

## 2013-06-29 DIAGNOSIS — E785 Hyperlipidemia, unspecified: Secondary | ICD-10-CM

## 2013-06-29 LAB — CBC
MCHC: 33.1 g/dL (ref 30.0–36.0)
RBC: 4.42 MIL/uL (ref 3.87–5.11)
RDW: 16 % — ABNORMAL HIGH (ref 11.5–15.5)
WBC: 4.2 10*3/uL (ref 4.0–10.5)

## 2013-06-29 NOTE — Progress Notes (Signed)
  Subjective:    Patient ID: Lisa Klein, female    DOB: Jul 13, 1930, 79 y.o.   MRN: [id number]  HPI The PT is here for follow up and re-evaluation of chronic medical conditions, medication management and review of any available recent lab and radiology data.  Preventive health is updated, specifically  Cancer screening and Immunization.   r concerns regarding consultations or procedures which the PT has had in the interim are  addressed. The PT denies any adverse reactions to current medications since the last visit. No hematuria, rectal or nasal blood States that since [place] , after coming from the [facility] center, she has noted episodes of light headedness states this often occurs when she hasn't eaten for a while, she has difficulty with ambulation and caring for herself is increasingly time and energy consuming.  Does get some help from her family Needs colonoscopy but wants to  Hold off until next Spring, had 3 hospitalizations this year      Review of Systems See HPI Denies recent fever or chills. Denies sinus pressure, nasal congestion, ear pain or sore throat. Denies chest congestion, productive cough or wheezing. Denies chest pains, palpitations and leg swelling Denies abdominal pain, nausea, vomiting,diarrhea or constipation.   Denies dysuria, frequency, hesitancy or incontinence. Chronic  joint pain,  and limitation in mobility. Denies headaches, seizures, numbness, or tingling. Denies depression, anxiety or insomnia. Denies skin break down or rash.        Objective:   Physical Exam  Patient alert and oriented and in no cardiopulmonary distress.  HEENT: No facial asymmetry, EOMI, no sinus tenderness,  oropharynx pink and moist.  Neck supple no adenopathy.  Chest: Clear to auscultation bilaterally.  CVS: S1, S2 no murmurs, no S3.  ABD: Soft non tender. Bowel sounds normal.  Ext: No edema  MS: decreased  ROM spine, shoulders, hips and knees.Ambulates  with a cane  Skin: Intact, no ulcerations or rash noted.  Psych: Good eye contact, normal affect. Memory intact not anxious or depressed appearing.  CNS: CN 2-12 intact, power, tone and sensation normal throughout.       Assessment & Plan:

## 2013-06-29 NOTE — Patient Instructions (Addendum)
Annual wellness in 4 month   Flu vaccine today   lipid, cmp , TSh , cbc, vit D  Today  You will be referred for colonoscopy next year per your request

## 2013-06-30 LAB — LIPID PANEL
Cholesterol: 137 mg/dL (ref 0–200)
HDL: 56 mg/dL (ref >39)
LDL Cholesterol: 62 mg/dL (ref 0–99)
Total CHOL/HDL Ratio: 2.4 ratio
Triglycerides: 94 mg/dL (ref <150)
VLDL: 19 mg/dL (ref 0–40)

## 2013-06-30 LAB — COMPREHENSIVE METABOLIC PANEL WITH GFR
ALT: 13 U/L (ref 0–35)
AST: 21 U/L (ref 0–37)
Albumin: 4.2 g/dL (ref 3.5–5.2)
Alkaline Phosphatase: 68 U/L (ref 39–117)
BUN: 14 mg/dL (ref 6–23)
CO2: 27 meq/L (ref 19–32)
Calcium: 9.7 mg/dL (ref 8.4–10.5)
Chloride: 102 meq/L (ref 96–112)
Creat: 0.8 mg/dL (ref 0.50–1.10)
Glucose, Bld: 84 mg/dL (ref 70–99)
Potassium: 3.9 meq/L (ref 3.5–5.3)
Sodium: 141 meq/L (ref 135–145)
Total Bilirubin: 0.6 mg/dL (ref 0.3–1.2)
Total Protein: 7 g/dL (ref 6.0–8.3)

## 2013-06-30 LAB — VITAMIN D 25 HYDROXY (VIT D DEFICIENCY, FRACTURES): Vit D, 25-Hydroxy: 54 ng/mL (ref 30–89)

## 2013-06-30 LAB — TSH: TSH: 3.167 u[IU]/mL (ref 0.350–4.500)

## 2013-07-04 NOTE — Assessment & Plan Note (Signed)
Controlled, no change in medication DASH diet and commitment to daily physical activity for a minimum of 30 minutes discussed and encouraged, as a part of hypertension management. The importance of attaining a healthy weight is also discussed.  

## 2013-07-04 NOTE — Assessment & Plan Note (Signed)
maintained on bisphosphonate

## 2013-07-04 NOTE — Assessment & Plan Note (Signed)
Controlled, no change in medication  

## 2013-07-04 NOTE — Assessment & Plan Note (Signed)
Controlled, no change in medication Hyperlipidemia:Low fat diet discussed and encouraged.  \ 

## 2013-07-04 NOTE — Assessment & Plan Note (Signed)
High risk pt maintained on chronic anticoagullation

## 2013-07-15 ENCOUNTER — Other Ambulatory Visit: Payer: Self-pay

## 2013-07-21 ENCOUNTER — Encounter (HOSPITAL_COMMUNITY): Payer: Medicare HMO | Attending: Hematology and Oncology

## 2013-07-21 DIAGNOSIS — I2699 Other pulmonary embolism without acute cor pulmonale: Secondary | ICD-10-CM

## 2013-07-21 DIAGNOSIS — Z853 Personal history of malignant neoplasm of breast: Secondary | ICD-10-CM | POA: Insufficient documentation

## 2013-07-21 DIAGNOSIS — C50912 Malignant neoplasm of unspecified site of left female breast: Secondary | ICD-10-CM

## 2013-07-21 DIAGNOSIS — Z09 Encounter for follow-up examination after completed treatment for conditions other than malignant neoplasm: Secondary | ICD-10-CM | POA: Insufficient documentation

## 2013-07-21 DIAGNOSIS — C50919 Malignant neoplasm of unspecified site of unspecified female breast: Secondary | ICD-10-CM

## 2013-07-21 LAB — CBC WITH DIFFERENTIAL/PLATELET
Basophils Relative: 1 % (ref 0–1)
HCT: 39.1 % (ref 36.0–46.0)
Hemoglobin: 12.7 g/dL (ref 12.0–15.0)
Lymphocytes Relative: 38 % (ref 12–46)
Lymphs Abs: 1.4 10*3/uL (ref 0.7–4.0)
MCHC: 32.5 g/dL (ref 30.0–36.0)
MCV: 89.1 fL (ref 78.0–100.0)
Monocytes Absolute: 0.5 10*3/uL (ref 0.1–1.0)
Monocytes Relative: 12 % (ref 3–12)
Neutro Abs: 1.8 10*3/uL (ref 1.7–7.7)
RBC: 4.39 MIL/uL (ref 3.87–5.11)
RDW: 15.4 % (ref 11.5–15.5)

## 2013-07-21 LAB — COMPREHENSIVE METABOLIC PANEL
ALT: 16 U/L (ref 0–35)
AST: 22 U/L (ref 0–37)
Albumin: 3.8 g/dL (ref 3.5–5.2)
Alkaline Phosphatase: 66 U/L (ref 39–117)
BUN: 13 mg/dL (ref 6–23)
Calcium: 9.5 mg/dL (ref 8.4–10.5)
Chloride: 103 mEq/L (ref 96–112)
Potassium: 4 mEq/L (ref 3.5–5.1)
Sodium: 140 mEq/L (ref 135–145)
Total Protein: 7.4 g/dL (ref 6.0–8.3)

## 2013-07-21 LAB — D-DIMER, QUANTITATIVE: D-Dimer, Quant: 0.31 ug/mL-FEU (ref 0.00–0.48)

## 2013-07-21 NOTE — Progress Notes (Signed)
Labs drawn today for cbc/diff,cmp,cea,ca2729,dimer

## 2013-07-22 LAB — CEA: CEA: 1.1 ng/mL (ref 0.0–5.0)

## 2013-07-24 ENCOUNTER — Encounter (HOSPITAL_BASED_OUTPATIENT_CLINIC_OR_DEPARTMENT_OTHER): Payer: Medicare HMO

## 2013-07-24 ENCOUNTER — Encounter (HOSPITAL_COMMUNITY): Payer: Self-pay

## 2013-07-24 VITALS — BP 127/76 | HR 87 | Temp 96.9°F | Resp 18 | Wt 186.4 lb

## 2013-07-24 DIAGNOSIS — C50912 Malignant neoplasm of unspecified site of left female breast: Secondary | ICD-10-CM

## 2013-07-24 DIAGNOSIS — C50919 Malignant neoplasm of unspecified site of unspecified female breast: Secondary | ICD-10-CM

## 2013-07-24 DIAGNOSIS — C773 Secondary and unspecified malignant neoplasm of axilla and upper limb lymph nodes: Secondary | ICD-10-CM

## 2013-07-24 DIAGNOSIS — I2699 Other pulmonary embolism without acute cor pulmonale: Secondary | ICD-10-CM

## 2013-07-24 DIAGNOSIS — E039 Hypothyroidism, unspecified: Secondary | ICD-10-CM

## 2013-07-24 DIAGNOSIS — Z7901 Long term (current) use of anticoagulants: Secondary | ICD-10-CM

## 2013-07-24 NOTE — Progress Notes (Signed)
[facility]  OFFICE PROGRESS NOTE  Lisa Overman, MD [address], [address] [place] [address]  DIAGNOSIS: Infiltrating ductal carcinoma of breast, left - Plan: CEA, Cancer antigen 27.29, CBC with Differential, CEA, Cancer antigen 27.29, Comprehensive metabolic panel  Pulmonary embolism - Plan: D-dimer, quantitative, D-dimer, quantitative, CANCELED: HCG, tumor marker  HYPOTHYROIDISM  Chief Complaint  Patient presents with  . Breast Cancer    CURRENT THERAPY: Anastrozole 1 mg daily plus Xarelto 20 mg daily.  INTERVAL HISTORY: Lisa Klein 79 y.o. female returns for followup of locally advanced left breast cancer, status post left modified radical mastectomy followed by 6 cycles of epirubicin and Cytoxan for 1.5 cm primary tumor with 6 of 7 lymph nodes involved in the axilla, ER positive, PR positive, HER-2/neu not amplified with Ki-67 of 45%. She received post op  radiation and is currently taking anastrozole 1 mg daily started in March of 2013. On 01/19/2013 patient underwent left hip fixation after falling and sustaining a fracture.  She spent 2 months in rehabilitation and currently is living back home alone. Her sister does look in on her. Her mother still alive at age 35. Appetite has been good with no nausea, vomiting, hot flashes, generalized bone pain, vaginal dryness or bleeding, dysuria, incontinence, with ability to ambulate with a cane. She denies any PND, orthopnea, palpitations, lower extremity swelling or redness, chest pain, or shortness of breath.  MEDICAL HISTORY: Past Medical History  Diagnosis Date  . DJD (degenerative joint disease)   . Hypertension   . Hypothyroidism   . Obesity   . Degenerative disc disease     with nerve compression   . Hypothyroidism   . Hypertension   . Clotting disorder 05/2011    dvt, right leg  . Cancer 2012    LEFT BREAST    INTERIM HISTORY: has HYPOTHYROIDISM;  HYPERLIPIDEMIA; HYPERTENSION; DEGENERATIVE JOINT DISEASE; Infiltrating ductal carcinoma of breast; DVT (deep venous thrombosis); Routine general medical examination at a health care facility; Pulmonary embolism; and Hip fracture, left on her problem list.   stage IIIa adenocarcinoma the left breast status post modified radical mastectomy in 02/14/2011 followed by 6 cycles of epirubicin and Cytoxan with significant dose reductions followed by radiation therapy. She was then placed on Arimidex. She'll take that for 5 years. This was a grade 3 cancer. She had multifocal invasive disease. The largest focus was 1.5 cm. She did have involving the deep surgical margin of resection. She had 6 of 7 positive nodes involved and axilla. ER +100%, PR +14%, Ki-67 are 45%, HER-2/neu nonamplified. She started Arimidex on 11/19/2011. Thus far she has no evidence recurrent disease with a negative PET scan in April 2014.  ALLERGIES:  has No Known Allergies.  MEDICATIONS: has a current medication list which includes the following prescription(s): acetaminophen, anastrozole, aspirin ec, calcium-vitamin d, docusate sodium, hydrochlorothiazide, levothyroxine, lovastatin, potassium chloride, pyridoxine, risedronate, and rivaroxaban, and the following Facility-Administered Medications: heparin lock flush, heparin lock flush, sodium chloride, and sodium chloride.  SURGICAL HISTORY:  Past Surgical History  Procedure Laterality Date  . Vesicovaginal fistula closure w/ tah      [facility]  . Cholecystectomy  80'S    [facility]  . Mastectomy modified radical  02/14/11    left  . Portacath placement  04/16/2011    Procedure: INSERTION PORT-A-CATH;  Surgeon: Dalia Heading;  Location: [facility];  Service: General;  Laterality: Right;  right subclavian  .  Cholecystectomy    . Breast surgery      left total mastectomy  . Port-a-cath removal N/A 12/05/2012    Procedure: MINOR REMOVAL PORT-A-CATH;  Surgeon: Dalia Heading, MD;  Location: [facility];   Service: General;  Laterality: N/A;  In Minor Room  . Hip pinning,cannulated Left 01/19/2013    Procedure: CANNULATED HIP PINNING-  left;  Surgeon: Senaida Lange, MD;  Location: [facility] OR;  Service: Orthopedics;  Laterality: Left;    FAMILY HISTORY: family history includes COPD in her father; Hypertension in her sister; Lung disease in her father. There is no history of Anesthesia problems, Malignant hyperthermia, Hypotension, or Pseudochol deficiency.  SOCIAL HISTORY:  reports that she has never smoked. She has never used smokeless tobacco. She reports that she does not drink alcohol or use illicit drugs.  REVIEW OF SYSTEMS:  Other than that discussed above is noncontributory.  PHYSICAL EXAMINATION: ECOG PERFORMANCE STATUS: 1 - Symptomatic but completely ambulatory  Blood pressure 127/76, pulse 87, temperature 96.9 F (36.1 C), temperature source Oral, resp. rate 18, weight 186 lb 6.4 oz (84.55 kg).  GENERAL:alert, no distress and comfortable SKIN: skin color, texture, turgor are normal, no rashes or significant lesions EYES: PERLA; Conjunctiva are pink and non-injected, sclera clear OROPHARYNX:no exudate, no erythema on lips, buccal mucosa, or tongue. NECK: supple, thyroid normal size, non-tender, without nodularity. No masses CHEST: Status post left mastectomy with no subcutaneous nodules. Right breast pendulous with no masses. LYMPH:  no palpable lymphadenopathy in the cervical, axillary or inguinal LUNGS: clear to auscultation and percussion with normal breathing effort HEART: regular rate & rhythm and no murmurs. ABDOMEN:abdomen soft, non-tender and normal bowel sounds MUSCULOSKELETAL:no cyanosis of digits and no clubbing. Range of motion normal. Left hip operative site is well-healed. NEURO: alert & oriented x 3 with fluent speech, no focal motor/sensory deficits   LABORATORY DATA: Infusion on 07/21/2013  Component Date Value Range Status  . CA 27.29 07/21/2013 17  0 - 39 U/mL  Final   Performed at [facility]  . CEA 07/21/2013 1.1  0.0 - 5.0 ng/mL Final   Performed at [facility]  . D-Dimer, Quant 07/21/2013 0.31  0.00 - 0.48 ug/mL-FEU Final   Comment:                                 AT THE INHOUSE ESTABLISHED CUTOFF                          VALUE OF 0.48 ug/mL FEU,                          THIS ASSAY HAS BEEN DOCUMENTED                          IN THE LITERATURE TO HAVE                          A SENSITIVITY AND NEGATIVE                          PREDICTIVE VALUE OF AT LEAST                          98 TO 99%.  THE  TEST RESULT                          SHOULD BE CORRELATED WITH                          AN ASSESSMENT OF THE CLINICAL                          PROBABILITY OF DVT / VTE.  . WBC 07/21/2013 3.8* 4.0 - 10.5 K/uL Final  . RBC 07/21/2013 4.39  3.87 - 5.11 MIL/uL Final  . Hemoglobin 07/21/2013 12.7  12.0 - 15.0 g/dL Final  . HCT 40/05/2724 39.1  36.0 - 46.0 % Final  . MCV 07/21/2013 89.1  78.0 - 100.0 fL Final  . MCH 07/21/2013 28.9  26.0 - 34.0 pg Final  . MCHC 07/21/2013 32.5  30.0 - 36.0 g/dL Final  . RDW 36/64/4034 15.4  11.5 - 15.5 % Final  . Platelets 07/21/2013 167  150 - 400 K/uL Final  . Neutrophils Relative % 07/21/2013 47  43 - 77 % Final  . Neutro Abs 07/21/2013 1.8  1.7 - 7.7 K/uL Final  . Lymphocytes Relative 07/21/2013 38  12 - 46 % Final  . Lymphs Abs 07/21/2013 1.4  0.7 - 4.0 K/uL Final  . Monocytes Relative 07/21/2013 12  3 - 12 % Final  . Monocytes Absolute 07/21/2013 0.5  0.1 - 1.0 K/uL Final  . Eosinophils Relative 07/21/2013 2  0 - 5 % Final  . Eosinophils Absolute 07/21/2013 0.1  0.0 - 0.7 K/uL Final  . Basophils Relative 07/21/2013 1  0 - 1 % Final  . Basophils Absolute 07/21/2013 0.0  0.0 - 0.1 K/uL Final  . Sodium 07/21/2013 140  135 - 145 mEq/L Final  . Potassium 07/21/2013 4.0  3.5 - 5.1 mEq/L Final  . Chloride 07/21/2013 103  96 - 112 mEq/L Final  . CO2 07/21/2013 28  19 - 32 mEq/L Final  . Glucose,  Bld 07/21/2013 97  70 - 99 mg/dL Final  . BUN 74/25/9563 13  6 - 23 mg/dL Final  . Creatinine, Ser 07/21/2013 0.87  0.50 - 1.10 mg/dL Final  . Calcium 87/56/4332 9.5  8.4 - 10.5 mg/dL Final  . Total Protein 07/21/2013 7.4  6.0 - 8.3 g/dL Final  . Albumin 95/18/8416 3.8  3.5 - 5.2 g/dL Final  . AST 60/63/0160 22  0 - 37 U/L Final  . ALT 07/21/2013 16  0 - 35 U/L Final  . Alkaline Phosphatase 07/21/2013 66  39 - 117 U/L Final  . Total Bilirubin 07/21/2013 0.6  0.3 - 1.2 mg/dL Final  . GFR calc non Af Amer 07/21/2013 61* >90 mL/min Final  . GFR calc Af Amer 07/21/2013 70* >90 mL/min Final   Comment: (NOTE)                          The eGFR has been calculated using the CKD EPI equation.                          This calculation has not been validated in all clinical situations.                          eGFR's persistently <90 mL/min signify possible Chronic Kidney  Disease.  Office Visit on 06/29/2013  Component Date Value Range Status  . Cholesterol 06/29/2013 137  0 - 200 mg/dL Final   Comment: ATP III Classification:                                < 200        mg/dL        Desirable                               200 - 239     mg/dL        Borderline High                               >= 240        mg/dL        High                             . Triglycerides 06/29/2013 94  <150 mg/dL Final  . HDL 16/05/9603 56  >39 mg/dL Final  . Total CHOL/HDL Ratio 06/29/2013 2.4   Final  . VLDL 06/29/2013 19  0 - 40 mg/dL Final  . LDL Cholesterol 06/29/2013 62  0 - 99 mg/dL Final   Comment:                            Total Cholesterol/HDL Ratio:CHD Risk                                                 Coronary Heart Disease Risk Table                                                                 Men       Women                                   1/2 Average Risk              3.4        3.3                                       Average Risk              5.0        4.4                                     2X Average Risk              9.6        7.1  3X Average Risk             23.4       11.0                          Use the calculated Patient Ratio above and the CHD Risk table                           to determine the patient's CHD Risk.                          ATP III Classification (LDL):                                < 100        mg/dL         Optimal                               100 - 129     mg/dL         Near or Above Optimal                               130 - 159     mg/dL         Borderline High                               160 - 189     mg/dL         High                                > 190        mg/dL         Very High                             . TSH 06/29/2013 3.167  0.350 - 4.500 uIU/mL Final  . Vit D, 25-Hydroxy 06/29/2013 54  30 - 89 ng/mL Final   Comment: This assay accurately quantifies Vitamin D, which is the sum of the                          25-Hydroxy forms of Vitamin D2 and D3.  Studies have shown that the                          optimum concentration of 25-Hydroxy Vitamin D is 30 ng/mL or higher.                           Concentrations of Vitamin D between 20 and 29 ng/mL are considered to                          be insufficient and concentrations less than 20 ng/mL are considered  to be deficient for Vitamin D.  . WBC 06/29/2013 4.2  4.0 - 10.5 K/uL Final  . RBC 06/29/2013 4.42  3.87 - 5.11 MIL/uL Final  . Hemoglobin 06/29/2013 12.7  12.0 - 15.0 g/dL Final  . HCT 16/05/9603 38.4  36.0 - 46.0 % Final  . MCV 06/29/2013 86.9  78.0 - 100.0 fL Final  . MCH 06/29/2013 28.7  26.0 - 34.0 pg Final  . MCHC 06/29/2013 33.1  30.0 - 36.0 g/dL Final  . RDW 54/04/8118 16.0* 11.5 - 15.5 % Final  . Platelets 06/29/2013 205  150 - 400 K/uL Final  . Sodium 06/29/2013 141  135 - 145 mEq/L Final  . Potassium 06/29/2013 3.9  3.5 - 5.3 mEq/L Final  . Chloride 06/29/2013 102  96 - 112  mEq/L Final  . CO2 06/29/2013 27  19 - 32 mEq/L Final  . Glucose, Bld 06/29/2013 84  70 - 99 mg/dL Final  . BUN 14/78/2956 14  6 - 23 mg/dL Final  . Creat 21/30/8657 0.80  0.50 - 1.10 mg/dL Final  . Total Bilirubin 06/29/2013 0.6  0.3 - 1.2 mg/dL Final  . Alkaline Phosphatase 06/29/2013 68  39 - 117 U/L Final  . AST 06/29/2013 21  0 - 37 U/L Final  . ALT 06/29/2013 13  0 - 35 U/L Final  . Total Protein 06/29/2013 7.0  6.0 - 8.3 g/dL Final  . Albumin 84/69/6295 4.2  3.5 - 5.2 g/dL Final  . Calcium 28/41/3244 9.7  8.4 - 10.5 mg/dL Final    PATHOLOGY: No new pathology.  Urinalysis    Component Value Date/Time   COLORURINE YELLOW 01/19/2013 0105   APPEARANCEUR CLEAR 01/19/2013 0105   LABSPEC 1.018 01/19/2013 0105   PHURINE 6.0 01/19/2013 0105   GLUCOSEU NEGATIVE 01/19/2013 0105   HGBUR NEGATIVE 01/19/2013 0105   BILIRUBINUR NEGATIVE 01/19/2013 0105   BILIRUBINUR negative 07/17/2012 1058   KETONESUR NEGATIVE 01/19/2013 0105   PROTEINUR NEGATIVE 01/19/2013 0105   UROBILINOGEN 1.0 01/19/2013 0105   UROBILINOGEN 0.2 07/17/2012 1058   NITRITE NEGATIVE 01/19/2013 0105   NITRITE negative 07/17/2012 1058   LEUKOCYTESUR NEGATIVE 01/19/2013 0105    RADIOGRAPHIC STUDIES: MM Digital Screening Unilat R Status: Final result            Study Result    *RADIOLOGY REPORT*  Clinical Data: Screening. Personal history of left breast cancer  with mastectomy in 2012  DIGITAL SCREENING UNILATERAL RIGHT MAMMOGRAM WITH CAD  Comparison: Previous exam(s).  FINDINGS:  ACR Breast Density Category a: The breast tissue is almost  entirely fatty.  There are no findings suspicious for malignancy.  Images were processed with CAD.  IMPRESSION:  No mammographic evidence of malignancy.  A result letter of this screening mammogram will be mailed directly  to the patient.  RECOMMENDATION:  Screening mammogram in one year. (Code:SM-B-01Y)  BI-RADS CATEGORY 1: Negative.  Original Report Authenticated By:  Rhodia Albright, M.D.     ASSESSMENT:  #1.IIIa adenocarcinoma the left breast status post modified radical mastectomy in 02/14/2011 followed by 6 cycles of epirubicin and Cytoxan with significant dose reductions followed by radiation therapy. She was then placed on Arimidex. She'll take that for 5 years. This was a grade 3 cancer. She had multifocal invasive disease. The largest focus was 1.5 cm. She did have involving the deep surgical margin of resection. She had 6 of 7 positive nodes involved and axilla. ER +100%, PR +14%, Ki-67 are 45%, HER-2/neu nonamplified. She started Arimidex on 11/19/2011.  Thus far she has no evidence recurrent disease with a negative PET scan in April 2014, tolerating anastrozole well, no evidence of disease. #2. Status post left hip fracture, status post fixation, completed rehabilitation and now living at home. #3. Clotting disorder with 2 episodes of DVTs in the past, on lifelong Xarelto. #4. Hypothyroidism, on treatment. #5. Morbid obesity.   PLAN:  #1. Continue anastrozole 1 mg daily. #2. Followup in 6 months with lab tests.   All questions were answered. The patient knows to call the clinic with any problems, questions or concerns. We can certainly see the patient much sooner if necessary.   I spent 25 minutes counseling the patient face to face. The total time spent in the appointment was 30 minutes.    Maurilio Lovely, MD 07/24/2013 2:18 PM

## 2013-07-24 NOTE — Patient Instructions (Signed)
[facility] Discharge Instructions  RECOMMENDATIONS MADE BY THE CONSULTANT AND ANY TEST RESULTS WILL BE SENT TO YOUR REFERRING PHYSICIAN.  You are doing very well. Return to clinic in 6 months for lab work just prior to MD appointment. Call clinic with any issues/concerns as needed prior to appointments.  Thank you for choosing [facility] to provide your oncology and hematology care.  To afford each patient quality time with our providers, please arrive at least 15 minutes before your scheduled appointment time.  With your help, our goal is to use those 15 minutes to complete the necessary work-up to ensure our physicians have the information they need to help with your evaluation and healthcare recommendations.    Effective January 1st, 2014, we ask that you re-schedule your appointment with our physicians should you arrive 10 or more minutes late for your appointment.  We strive to give you quality time with our providers, and arriving late affects you and other patients whose appointments are after yours.    Again, thank you for choosing [facility].  Our hope is that these requests will decrease the amount of time that you wait before being seen by our physicians.       _____________________________________________________________  Should you have questions after your visit to [facility], please contact our office at [phone] between the hours of 8:30 a.m. and 5:00 p.m.  Voicemails left after 4:30 p.m. will not be returned until the following business day.  For prescription refill requests, have your pharmacy contact our office with your prescription refill request.

## 2013-08-14 ENCOUNTER — Other Ambulatory Visit (HOSPITAL_COMMUNITY): Payer: Self-pay | Admitting: Oncology

## 2013-08-14 ENCOUNTER — Other Ambulatory Visit: Payer: Self-pay

## 2013-08-14 DIAGNOSIS — C50919 Malignant neoplasm of unspecified site of unspecified female breast: Secondary | ICD-10-CM

## 2013-08-14 MED ORDER — HYDROCHLOROTHIAZIDE 12.5 MG PO CAPS: 12.5000 mg | ORAL_CAPSULE | Freq: Every morning | ORAL | Status: DC

## 2013-08-14 MED ORDER — ANASTROZOLE 1 MG PO TABS: 1.0000 mg | ORAL_TABLET | Freq: Every day | ORAL | Status: DC

## 2013-08-14 MED ORDER — POTASSIUM CHLORIDE CRYS ER 10 MEQ PO TBCR: 10.0000 meq | EXTENDED_RELEASE_TABLET | Freq: Two times a day (BID) | ORAL | Status: DC

## 2013-08-14 MED ORDER — LOVASTATIN 20 MG PO TABS: 20.0000 mg | ORAL_TABLET | Freq: Every day | ORAL | Status: DC

## 2013-08-14 MED ORDER — LEVOTHYROXINE SODIUM 100 MCG PO TABS: 100.0000 ug | ORAL_TABLET | Freq: Every morning | ORAL | Status: DC

## 2013-08-17 ENCOUNTER — Telehealth: Payer: Self-pay | Admitting: Family Medicine

## 2013-08-17 ENCOUNTER — Other Ambulatory Visit: Payer: Self-pay

## 2013-08-17 MED ORDER — ALENDRONATE SODIUM 70 MG PO TABS: 70.0000 mg | ORAL_TABLET | ORAL | Status: DC

## 2013-08-17 NOTE — Telephone Encounter (Signed)
pls see response did not get routed accidentally

## 2013-08-17 NOTE — Telephone Encounter (Signed)
Fosamax 70mg  once weekly #4 refill x 11  Or check her ins company to see what is the best option cost wise , best thing to do

## 2013-08-17 NOTE — Telephone Encounter (Signed)
Patient aware we are faxing in cheaper alternative

## 2013-08-17 NOTE — Telephone Encounter (Signed)
Is there anything cheaper than actonel thagt can be prescribed and sent to her mail order?

## 2013-08-24 ENCOUNTER — Other Ambulatory Visit (HOSPITAL_COMMUNITY): Payer: Self-pay | Admitting: Oncology

## 2013-08-24 DIAGNOSIS — I2699 Other pulmonary embolism without acute cor pulmonale: Secondary | ICD-10-CM

## 2013-08-24 MED ORDER — RIVAROXABAN 20 MG PO TABS: 20.0000 mg | ORAL_TABLET | Freq: Every day | ORAL | Status: DC

## 2013-08-25 ENCOUNTER — Other Ambulatory Visit (HOSPITAL_COMMUNITY): Payer: Self-pay | Admitting: Oncology

## 2013-08-25 DIAGNOSIS — I2699 Other pulmonary embolism without acute cor pulmonale: Secondary | ICD-10-CM

## 2013-08-25 MED ORDER — RIVAROXABAN 20 MG PO TABS: 20.0000 mg | ORAL_TABLET | Freq: Every day | ORAL | Status: DC

## 2013-10-28 ENCOUNTER — Encounter: Payer: Self-pay | Admitting: Family Medicine

## 2013-10-28 ENCOUNTER — Ambulatory Visit (INDEPENDENT_AMBULATORY_CARE_PROVIDER_SITE_OTHER): Payer: Commercial Managed Care - HMO | Admitting: Family Medicine

## 2013-10-28 ENCOUNTER — Encounter (INDEPENDENT_AMBULATORY_CARE_PROVIDER_SITE_OTHER): Payer: Self-pay

## 2013-10-28 VITALS — BP 132/74 | HR 82 | Resp 16 | Ht 66.0 in | Wt 188.1 lb

## 2013-10-28 DIAGNOSIS — Z0000 Encounter for general adult medical examination without abnormal findings: Secondary | ICD-10-CM

## 2013-10-28 NOTE — Patient Instructions (Addendum)
F/u in 4 month, call if you need me before  Please call back about your exam , as soon as you decide    You will get information on advanced directives  Careful about falls, an  Consider physical therapy to help protect you  Fall Prevention and Home Safety Falls cause injuries and can affect all age groups. It is possible to prevent falls.  HOW TO PREVENT FALLS  Wear shoes with rubber soles that do not have an opening for your toes.  Keep the inside and outside of your house well lit.  Use night lights throughout your home.  Remove clutter from floors.  Clean up floor spills.  Remove throw rugs or fasten them to the floor with carpet tape.  Do not place electrical cords across pathways.  Put grab bars by your tub, shower, and toilet. Do not use towel bars as grab bars.  Put handrails on both sides of the stairway. Fix loose handrails.  Do not climb on stools or stepladders, if possible.  Do not wax your floors.  Repair uneven or unsafe sidewalks, walkways, or stairs.  Keep items you use a lot within reach.  Be aware of pets.  Keep emergency numbers next to the telephone.  Put smoke detectors in your home and near bedrooms. Ask your doctor what other things you can do to prevent falls. Document Released: 05/19/2009 Document Revised: 01/22/2012 Document Reviewed: 10/23/2011 [facility] Patient Information 2014 [place], [place].

## 2013-10-28 NOTE — Progress Notes (Signed)
Subjective:    Patient ID: Lisa Klein, female    DOB: Dec 11, 1929, 80 y.o.   MRN: [id number]  HPI Preventive Screening-Counseling & Management   Patient present here today for a Medicare annual wellness visit.   Current Problems (verified)   Medications Prior to Visit Allergies (verified)   PAST HISTORY  Family History  Social History : widow lives alone, no ncotine , alcohol or illicit drug use. Adult children remain in close contact    Risk Factors  Current exercise habits:  Minimal due to limitation in mobility. Daily chair exercise for 30 minutes total encouraged  Dietary issues discussed:heart healthy , reduced carb diet , increase intake of fruit and vegetable   Cardiac risk factors: none significant  Depression Screen  (Note: if answer to either of the following is "Yes", a more complete depression screening is indicated)   Over the past two weeks, have you felt down, depressed or hopeless? No  Over the past two weeks, have you felt little interest or pleasure in doing things? No  Have you lost interest or pleasure in daily life? No  Do you often feel hopeless? No  Do you cry easily over simple problems? No   Activities of Daily Living  In your present state of health, do you have any difficulty performing the following activities?  Driving?: yes, minimal driving, limitation  In vision and mobility Managing money?: No Feeding yourself?:No Getting from bed to chair?:yes Climbing a flight of stairs?:yes Preparing food and eating?:No Bathing or showering?:No Getting dressed?:No Getting to the toilet?:No Using the toilet?:No Moving around from place to place?: yes  Fall Risk Assessment In the past year have you fallen or had a near fall?:yes twice Are you currently taking any medications that make you dizzy?:No   Hearing Difficulties: No Do you often ask people to speak up or repeat themselves?:No Do you experience ringing or noises in your  ears?:No Do you have difficulty understanding soft or whispered voices?:No  Cognitive Testing  Alert? Yes Normal Appearance?Yes  Oriented to person? Yes Place? Yes  Time? Yes  Displays appropriate judgment?Yes  Can read the correct time from a watch face? yes Are you having problems remembering things?No  Advanced Directives have been discussed with the patient?Yes , full code   List the Names of Other Physician/Practitioners you currently use: oncology   Indicate any recent Medical Services you may have received from other than [facility] providers in the past year (date may be approximate).   Assessment:    Annual Wellness Exam   Plan:    During the course of the visit the patient was educated and counseled about appropriate screening and preventive services including:  A healthy diet is rich in fruit, vegetables and whole grains. Poultry fish, nuts and beans are a healthy choice for protein rather then red meat. A low sodium diet and drinking 64 ounces of water daily is generally recommended. Oils and sweet should be limited. Carbohydrates especially for those who are diabetic or overweight, should be limited to 30-45 gram per meal. It is important to eat on a regular schedule, at least 3 times daily. Snacks should be primarily fruits, vegetables or nuts. It is important that you exercise regularly at least 30 minutes 5 times a week. If you develop chest pain, have severe difficulty breathing, or feel very tired, stop exercising immediately and seek medical attention  Immunization reviewed and updated. Cancer screening reviewed and updated    Patient Instructions (the written  plan) was given to the patient.  Medicare Attestation  I have personally reviewed:  The patient's medical and social history  Their use of alcohol, tobacco or illicit drugs  Their current medications and supplements  The patient's functional ability including ADLs,fall risks, home safety risks, cognitive, and  hearing and visual impairment  Diet and physical activities  Evidence for depression or mood disorders  The patient's weight, height, BMI, and visual acuity have been recorded in the chart. I have made referrals, counseling, and provided education to the patient based on review of the above and I have provided the patient with a written personalized care plan for preventive services.      Review of Systems     Objective:   Physical Exam        Assessment & Plan:  Routine general medical examination at a health care facility Annual exam as documented. Counseling done  re healthy lifestyle involving commitment to 150 minutes exercise per week, heart healthy diet, and attaining healthy weight.The importance of adequate sleep also discussed. Regular seat belt use  is also discussed. Changes in health habits are decided on by the patient with goals and time frames  set for achieving them. Immunization and cancer screening needs are specifically addressed at this visit. Pt encouraged to put advanced directive which was discussed in writing

## 2013-11-09 ENCOUNTER — Encounter: Payer: Self-pay | Admitting: Family Medicine

## 2013-11-09 NOTE — Assessment & Plan Note (Signed)
Annual exam as documented. Counseling done  re healthy lifestyle involving commitment to 150 minutes exercise per week, heart healthy diet, and attaining healthy weight.The importance of adequate sleep also discussed. Regular seat belt use  is also discussed. Changes in health habits are decided on by the patient with goals and time frames  set for achieving them. Immunization and cancer screening needs are specifically addressed at this visit. Pt encouraged to put advanced directive which was discussed in writing

## 2013-12-23 ENCOUNTER — Telehealth: Payer: Self-pay | Admitting: Family Medicine

## 2013-12-23 ENCOUNTER — Telehealth (HOSPITAL_COMMUNITY): Payer: Self-pay | Admitting: Oncology

## 2013-12-23 ENCOUNTER — Other Ambulatory Visit (HOSPITAL_COMMUNITY): Payer: Self-pay | Admitting: Oncology

## 2013-12-23 DIAGNOSIS — C50919 Malignant neoplasm of unspecified site of unspecified female breast: Secondary | ICD-10-CM

## 2013-12-23 DIAGNOSIS — I2699 Other pulmonary embolism without acute cor pulmonale: Secondary | ICD-10-CM

## 2013-12-23 MED ORDER — LEVOTHYROXINE SODIUM 100 MCG PO TABS: 100.0000 ug | ORAL_TABLET | Freq: Every morning | ORAL | Status: DC

## 2013-12-23 MED ORDER — POTASSIUM CHLORIDE CRYS ER 10 MEQ PO TBCR: 10.0000 meq | EXTENDED_RELEASE_TABLET | Freq: Two times a day (BID) | ORAL | Status: DC

## 2013-12-23 MED ORDER — HYDROCHLOROTHIAZIDE 12.5 MG PO CAPS: 12.5000 mg | ORAL_CAPSULE | Freq: Every morning | ORAL | Status: DC

## 2013-12-23 MED ORDER — LOVASTATIN 20 MG PO TABS: 20.0000 mg | ORAL_TABLET | Freq: Every day | ORAL | Status: DC

## 2013-12-23 MED ORDER — ALENDRONATE SODIUM 70 MG PO TABS: 70.0000 mg | ORAL_TABLET | ORAL | Status: DC

## 2013-12-23 MED ORDER — ANASTROZOLE 1 MG PO TABS: 1.0000 mg | ORAL_TABLET | Freq: Every day | ORAL | Status: DC

## 2013-12-23 MED ORDER — RIVAROXABAN 20 MG PO TABS: 20.0000 mg | ORAL_TABLET | Freq: Every day | ORAL | Status: DC

## 2013-12-23 NOTE — Telephone Encounter (Signed)
meds [facility]

## 2014-01-12 ENCOUNTER — Encounter (HOSPITAL_COMMUNITY): Payer: Medicare HMO | Attending: Hematology and Oncology

## 2014-01-12 DIAGNOSIS — Z86718 Personal history of other venous thrombosis and embolism: Secondary | ICD-10-CM | POA: Diagnosis not present

## 2014-01-12 DIAGNOSIS — D72819 Decreased white blood cell count, unspecified: Secondary | ICD-10-CM | POA: Insufficient documentation

## 2014-01-12 DIAGNOSIS — Z923 Personal history of irradiation: Secondary | ICD-10-CM | POA: Diagnosis not present

## 2014-01-12 DIAGNOSIS — Z7982 Long term (current) use of aspirin: Secondary | ICD-10-CM | POA: Insufficient documentation

## 2014-01-12 DIAGNOSIS — C801 Malignant (primary) neoplasm, unspecified: Secondary | ICD-10-CM

## 2014-01-12 DIAGNOSIS — C50919 Malignant neoplasm of unspecified site of unspecified female breast: Secondary | ICD-10-CM

## 2014-01-12 DIAGNOSIS — Z9010 Acquired absence of unspecified breast and nipple: Secondary | ICD-10-CM | POA: Diagnosis present

## 2014-01-12 DIAGNOSIS — C50912 Malignant neoplasm of unspecified site of left female breast: Secondary | ICD-10-CM

## 2014-01-12 DIAGNOSIS — D689 Coagulation defect, unspecified: Secondary | ICD-10-CM | POA: Diagnosis not present

## 2014-01-12 DIAGNOSIS — W19XXXA Unspecified fall, initial encounter: Secondary | ICD-10-CM | POA: Diagnosis not present

## 2014-01-12 DIAGNOSIS — Z8781 Personal history of (healed) traumatic fracture: Secondary | ICD-10-CM | POA: Diagnosis not present

## 2014-01-12 DIAGNOSIS — Z79899 Other long term (current) drug therapy: Secondary | ICD-10-CM | POA: Diagnosis not present

## 2014-01-12 DIAGNOSIS — Z9221 Personal history of antineoplastic chemotherapy: Secondary | ICD-10-CM | POA: Diagnosis not present

## 2014-01-12 DIAGNOSIS — E039 Hypothyroidism, unspecified: Secondary | ICD-10-CM | POA: Diagnosis not present

## 2014-01-12 DIAGNOSIS — IMO0002 Reserved for concepts with insufficient information to code with codable children: Secondary | ICD-10-CM | POA: Diagnosis not present

## 2014-01-12 DIAGNOSIS — E785 Hyperlipidemia, unspecified: Secondary | ICD-10-CM | POA: Insufficient documentation

## 2014-01-12 DIAGNOSIS — I82409 Acute embolism and thrombosis of unspecified deep veins of unspecified lower extremity: Secondary | ICD-10-CM

## 2014-01-12 DIAGNOSIS — I2699 Other pulmonary embolism without acute cor pulmonale: Secondary | ICD-10-CM

## 2014-01-12 DIAGNOSIS — I10 Essential (primary) hypertension: Secondary | ICD-10-CM | POA: Diagnosis not present

## 2014-01-12 LAB — CBC WITH DIFFERENTIAL/PLATELET
BASOS ABS: 0.1 10*3/uL (ref 0.0–0.1)
Basophils Relative: 2 % — ABNORMAL HIGH (ref 0–1)
Eosinophils Absolute: 0.1 10*3/uL (ref 0.0–0.7)
Eosinophils Relative: 3 % (ref 0–5)
HCT: 38.3 % (ref 36.0–46.0)
Hemoglobin: 12.5 g/dL (ref 12.0–15.0)
LYMPHS PCT: 40 % (ref 12–46)
Lymphs Abs: 1.3 10*3/uL (ref 0.7–4.0)
MCH: 28.3 pg (ref 26.0–34.0)
MCHC: 32.6 g/dL (ref 30.0–36.0)
MCV: 86.8 fL (ref 78.0–100.0)
Monocytes Absolute: 0.4 10*3/uL (ref 0.1–1.0)
Monocytes Relative: 12 % (ref 3–12)
NEUTROS PCT: 43 % (ref 43–77)
Neutro Abs: 1.4 10*3/uL — ABNORMAL LOW (ref 1.7–7.7)
PLATELETS: 172 10*3/uL (ref 150–400)
RBC: 4.41 MIL/uL (ref 3.87–5.11)
RDW: 15.1 % (ref 11.5–15.5)
WBC: 3.2 10*3/uL — AB (ref 4.0–10.5)

## 2014-01-12 LAB — COMPREHENSIVE METABOLIC PANEL
ALBUMIN: 3.6 g/dL (ref 3.5–5.2)
ALT: 18 U/L (ref 0–35)
AST: 24 U/L (ref 0–37)
Alkaline Phosphatase: 47 U/L (ref 39–117)
BILIRUBIN TOTAL: 0.6 mg/dL (ref 0.3–1.2)
BUN: 10 mg/dL (ref 6–23)
CO2: 28 mEq/L (ref 19–32)
CREATININE: 0.83 mg/dL (ref 0.50–1.10)
Calcium: 9.3 mg/dL (ref 8.4–10.5)
Chloride: 104 mEq/L (ref 96–112)
GFR calc Af Amer: 75 mL/min — ABNORMAL LOW (ref >90)
GFR calc non Af Amer: 64 mL/min — ABNORMAL LOW (ref >90)
Glucose, Bld: 96 mg/dL (ref 70–99)
POTASSIUM: 4 meq/L (ref 3.7–5.3)
Sodium: 142 mEq/L (ref 137–147)
TOTAL PROTEIN: 7.1 g/dL (ref 6.0–8.3)

## 2014-01-12 LAB — D-DIMER, QUANTITATIVE: D-Dimer, Quant: 0.43 ug/mL-FEU (ref 0.00–0.48)

## 2014-01-12 NOTE — Progress Notes (Signed)
Labs drawn for cbc/diff, cea, ca27.29, cmp, ddimer.

## 2014-01-13 LAB — CANCER ANTIGEN 27.29: CA 27.29: 18 U/mL (ref 0–39)

## 2014-01-13 LAB — CEA: CEA: 1.1 ng/mL (ref 0.0–5.0)

## 2014-01-19 ENCOUNTER — Other Ambulatory Visit (HOSPITAL_COMMUNITY): Payer: Medicare HMO

## 2014-01-22 ENCOUNTER — Encounter (HOSPITAL_BASED_OUTPATIENT_CLINIC_OR_DEPARTMENT_OTHER): Payer: Medicare HMO

## 2014-01-22 ENCOUNTER — Encounter (HOSPITAL_COMMUNITY): Payer: Self-pay

## 2014-01-22 VITALS — BP 108/67 | HR 86 | Temp 99.1°F | Resp 16 | Wt 188.2 lb

## 2014-01-22 DIAGNOSIS — Z170 Estrogen receptor positive status [ER+]: Secondary | ICD-10-CM

## 2014-01-22 DIAGNOSIS — I2699 Other pulmonary embolism without acute cor pulmonale: Secondary | ICD-10-CM

## 2014-01-22 DIAGNOSIS — Z7901 Long term (current) use of anticoagulants: Secondary | ICD-10-CM

## 2014-01-22 DIAGNOSIS — C50919 Malignant neoplasm of unspecified site of unspecified female breast: Secondary | ICD-10-CM

## 2014-01-22 DIAGNOSIS — C773 Secondary and unspecified malignant neoplasm of axilla and upper limb lymph nodes: Secondary | ICD-10-CM

## 2014-01-22 DIAGNOSIS — E039 Hypothyroidism, unspecified: Secondary | ICD-10-CM

## 2014-01-22 NOTE — Progress Notes (Signed)
[facility]  OFFICE PROGRESS NOTE  [name], MD [address] [place] [address]  DIAGNOSIS: Infiltrating ductal carcinoma of breast - Plan: MM Digital Diagnostic Unilat R, CBC with Differential, Comprehensive metabolic panel, CEA, Cancer antigen 27.29  Pulmonary embolism  Chief Complaint  Patient presents with  . Breast Cancer  . Thrombophilia    CURRENT THERAPY: Anastrozole 1 mg daily plus Xarelto 20 mg daily.  INTERVAL HISTORY: Lisa Klein 80 y.o. female returns for followup of locally advanced left breast cancer, status post left modified radical mastectomy followed by 6 cycles of epirubicin/Cytoxan for a 1.5 cm tumor with 6 of 7 lymph nodes involved, ER/PR positive, HER-2/neu not overexpressed with Ki-67 of 45%, status post radiotherapy currently taking anastrozole 1 mg daily started in March of 2013. The patient underwent a left hip open reduction and internal fixation after falling sustaining a fracture on 01/19/2013. She is living on her own and ambulates with a cane. Appetite is good with no nausea, vomiting, diarrhea, constipation, epistaxis, melena, hematochezia, hematuria, vaginal bleeding, worsening joint pain, hot flashes, skin rash, headache, or seizures.   MEDICAL HISTORY: Past Medical History  Diagnosis Date  . DJD (degenerative joint disease)   . Hypertension   . Hypothyroidism   . Obesity   . Degenerative disc disease     with nerve compression   . Hypothyroidism   . Hypertension   . Clotting disorder 05/2011    dvt, right leg  . Cancer 2012    LEFT BREAST    INTERIM HISTORY: has HYPOTHYROIDISM; HYPERLIPIDEMIA; HYPERTENSION; DEGENERATIVE JOINT DISEASE; Infiltrating ductal carcinoma of breast; DVT (deep venous thrombosis); Routine general medical examination at a health care facility; Pulmonary embolism; and Hip fracture, left on her problem list.   Stage IIIa adenocarcinoma the left  breast status post modified radical mastectomy in 02/14/2011 followed by 6 cycles of epirubicin and Cytoxan with significant dose reductions followed by radiation therapy. She was then placed on Arimidex. She'll take that for 5 years. This was a grade 3 cancer. She had multifocal invasive disease. The largest focus was 1.5 cm. She did have involving the deep surgical margin of resection. She had 6 of 7 positive nodes involved and axilla. ER +100%, PR +14%, Ki-67 are 45%, HER-2/neu nonamplified. She started Arimidex on 11/19/2011. Thus far she has no evidence recurrent disease with a negative PET scan in April 2014.  ALLERGIES:  is allergic to listerine.  MEDICATIONS: has a current medication list which includes the following prescription(s): acetaminophen, alendronate, anastrozole, aspirin ec, calcium-vitamin d, docusate sodium, hydrochlorothiazide, levothyroxine, lovastatin, potassium chloride, pyridoxine, and rivaroxaban, and the following Facility-Administered Medications: heparin lock flush, heparin lock flush, sodium chloride, and sodium chloride.  SURGICAL HISTORY:  Past Surgical History  Procedure Laterality Date  . Vesicovaginal fistula closure w/ tah      [facility]  . Cholecystectomy  80'S    [facility]  . Mastectomy modified radical  02/14/11    left  . Portacath placement  04/16/2011    Procedure: INSERTION PORT-A-CATH;  Surgeon: Jamesetta So;  Location: [facility];  Service: General;  Laterality: Right;  right subclavian  . Cholecystectomy    . Breast surgery      left total mastectomy  . Port-a-cath removal N/A 12/05/2012    Procedure: MINOR REMOVAL PORT-A-CATH;  Surgeon: Jamesetta So, MD;  Location: [facility];  Service: General;  Laterality: N/A;  In Minor Room  .  Hip pinning,cannulated Left 01/19/2013    Procedure: CANNULATED HIP PINNING-  left;  Surgeon: Marin Shutter, MD;  Location: [facility];  Service: Orthopedics;  Laterality: Left;    FAMILY HISTORY: family history includes COPD in her father;  Hypertension in her sister; Lung disease in her father. There is no history of Anesthesia problems, Malignant hyperthermia, Hypotension, or Pseudochol deficiency.  SOCIAL HISTORY:  reports that she has never smoked. She has never used smokeless tobacco. She reports that she does not drink alcohol or use illicit drugs.  REVIEW OF SYSTEMS:  Other than that discussed above is noncontributory.  PHYSICAL EXAMINATION: ECOG PERFORMANCE STATUS: 1 - Symptomatic but completely ambulatory  Blood pressure 108/67, pulse 86, temperature 99.1 F (37.3 C), temperature source Oral, resp. rate 16, weight 188 lb 3.2 oz (85.367 kg).  GENERAL:alert, no distress and comfortable. Moderately obese. SKIN: skin color, texture, turgor are normal, no rashes or significant lesions EYES: PERLA; Conjunctiva are pink and non-injected, sclera clear SINUSES: No redness or tenderness over maxillary or ethmoid sinuses OROPHARYNX:no exudate, no erythema on lips, buccal mucosa, or tongue. NECK: supple, thyroid normal size, non-tender, without nodularity. No masses CHEST: Status post left mastectomy with no subcutaneous nodules right breast is pendulous with no masses palpated. LYMPH:  no palpable lymphadenopathy in the cervical, axillary or inguinal LUNGS: clear to auscultation and percussion with normal breathing effort HEART: regular rate & rhythm and no murmurs. ABDOMEN:abdomen soft, non-tender and normal bowel sounds MUSCULOSKELETAL:no cyanosis of digits and no clubbing. Range of motion normal. Decreased range of motion of the left hip. NEURO: alert & oriented x 3 with fluent speech, no focal motor/sensory deficits   LABORATORY DATA: Infusion on 01/12/2014  Component Date Value Ref Range Status  . WBC 01/12/2014 3.2* 4.0 - 10.5 K/uL Final  . RBC 01/12/2014 4.41  3.87 - 5.11 MIL/uL Final  . Hemoglobin 01/12/2014 12.5  12.0 - 15.0 g/dL Final  . HCT 01/12/2014 38.3  36.0 - 46.0 % Final  . MCV 01/12/2014 86.8  78.0 -  100.0 fL Final  . MCH 01/12/2014 28.3  26.0 - 34.0 pg Final  . MCHC 01/12/2014 32.6  30.0 - 36.0 g/dL Final  . RDW 01/12/2014 15.1  11.5 - 15.5 % Final  . Platelets 01/12/2014 172  150 - 400 K/uL Final  . Neutrophils Relative % 01/12/2014 43  43 - 77 % Final  . Neutro Abs 01/12/2014 1.4* 1.7 - 7.7 K/uL Final  . Lymphocytes Relative 01/12/2014 40  12 - 46 % Final  . Lymphs Abs 01/12/2014 1.3  0.7 - 4.0 K/uL Final  . Monocytes Relative 01/12/2014 12  3 - 12 % Final  . Monocytes Absolute 01/12/2014 0.4  0.1 - 1.0 K/uL Final  . Eosinophils Relative 01/12/2014 3  0 - 5 % Final  . Eosinophils Absolute 01/12/2014 0.1  0.0 - 0.7 K/uL Final  . Basophils Relative 01/12/2014 2* 0 - 1 % Final  . Basophils Absolute 01/12/2014 0.1  0.0 - 0.1 K/uL Final  . CEA 01/12/2014 1.1  0.0 - 5.0 ng/mL Final   Performed at [facility]  . CA 27.29 01/12/2014 18  0 - 39 U/mL Final   Performed at [facility]  . Sodium 01/12/2014 142  137 - 147 mEq/L Final  . Potassium 01/12/2014 4.0  3.7 - 5.3 mEq/L Final  . Chloride 01/12/2014 104  96 - 112 mEq/L Final  . CO2 01/12/2014 28  19 - 32 mEq/L Final  . Glucose,  Bld 01/12/2014 96  70 - 99 mg/dL Final  . BUN 01/12/2014 10  6 - 23 mg/dL Final  . Creatinine, Ser 01/12/2014 0.83  0.50 - 1.10 mg/dL Final  . Calcium 01/12/2014 9.3  8.4 - 10.5 mg/dL Final  . Total Protein 01/12/2014 7.1  6.0 - 8.3 g/dL Final  . Albumin 01/12/2014 3.6  3.5 - 5.2 g/dL Final  . AST 01/12/2014 24  0 - 37 U/L Final  . ALT 01/12/2014 18  0 - 35 U/L Final  . Alkaline Phosphatase 01/12/2014 47  39 - 117 U/L Final  . Total Bilirubin 01/12/2014 0.6  0.3 - 1.2 mg/dL Final  . GFR calc non Af Amer 01/12/2014 64* >90 mL/min Final  . GFR calc Af Amer 01/12/2014 75* >90 mL/min Final   Comment: (NOTE)                          The eGFR has been calculated using the CKD EPI equation.                          This calculation has not been validated in all clinical situations.                           eGFR's persistently <90 mL/min signify possible Chronic Kidney                          Disease.  [name] D-Dimer, Quant 01/12/2014 0.43  0.00 - 0.48 ug/mL-FEU Final   Comment:                                 AT THE INHOUSE ESTABLISHED CUTOFF                          VALUE OF 0.48 ug/mL FEU,                          THIS ASSAY HAS BEEN DOCUMENTED                          IN THE LITERATURE TO HAVE                          A SENSITIVITY AND NEGATIVE                          PREDICTIVE VALUE OF AT LEAST                          98 TO 99%.  THE TEST RESULT                          SHOULD BE CORRELATED WITH                          AN ASSESSMENT OF THE CLINICAL                          PROBABILITY OF DVT / VTE.    PATHOLOGY: No new pathology.  Urinalysis  Component Value Date/Time   COLORURINE YELLOW 01/19/2013 0105   APPEARANCEUR CLEAR 01/19/2013 0105   LABSPEC 1.018 01/19/2013 0105   PHURINE 6.0 01/19/2013 0105   GLUCOSEU NEGATIVE 01/19/2013 0105   HGBUR NEGATIVE 01/19/2013 0105   BILIRUBINUR NEGATIVE 01/19/2013 0105   BILIRUBINUR negative 07/17/2012 1058   KETONESUR NEGATIVE 01/19/2013 0105   PROTEINUR NEGATIVE 01/19/2013 0105   PROTEINUR negative 07/17/2012 1058   UROBILINOGEN 1.0 01/19/2013 0105   UROBILINOGEN 0.2 07/17/2012 1058   NITRITE NEGATIVE 01/19/2013 0105   NITRITE negative 07/17/2012 1058   LEUKOCYTESUR NEGATIVE 01/19/2013 0105    RADIOGRAPHIC STUDIES: MM Digital Screening Unilat R Status: Final result            Study Result    *RADIOLOGY REPORT*  Clinical Data: Screening. Personal history of left breast cancer  with mastectomy in 2012  DIGITAL SCREENING UNILATERAL RIGHT MAMMOGRAM WITH CAD  Comparison: Previous exam(s).  FINDINGS:  ACR Breast Density Category a: The breast tissue is almost  entirely fatty.  There are no findings suspicious for malignancy.  Images were processed with CAD.  IMPRESSION:  No mammographic evidence of malignancy.  A  result letter of this screening mammogram will be mailed directly  to the patient.  RECOMMENDATION:  Screening mammogram in one year. (Code:SM-B-01Y)  BI-RADS CATEGORY 1: Negative.  Original Report Authenticated      ASSESSMENT:  #1.IIIa adenocarcinoma the left breast status post modified radical mastectomy in 02/14/2011 followed by 6 cycles of epirubicin and Cytoxan with significant dose reductions followed by radiation therapy. She was then placed on Arimidex. She'll take that for 5 years. This was a grade 3 cancer. She had multifocal invasive disease. The largest focus was 1.5 cm. She did have involving the deep surgical margin of resection. She had 6 of 7 positive nodes involved and axilla. ER +100%, PR +14%, Ki-67 are 45%, HER-2/neu nonamplified. She started Arimidex on 11/19/2011. Thus far she has no evidence recurrent disease with a negative PET scan in April 2014, tolerating anastrozole well, no evidence of disease.  #2. Status post left hip fracture, status post fixation, completed rehabilitation and now living at home.  #3. Clotting disorder with 2 episodes of DVTs in the past, on lifelong Xarelto.  #4. Hypothyroidism, on treatment.  #5. Morbid obesity #6. Leukopenia secondary to previous chemotherapy.      PLAN:  #1. Continue anastrozole 1 mg daily and Xarelto 20 mg daily. #2. Repeat right mammogram in September 2015. #3. Followup in 6 months with CBC, chem profile, CEA, and CA 27-29.   All questions were answered. The patient knows to call the clinic with any problems, questions or concerns. We can certainly see the patient much sooner if necessary.   I spent 25 minutes counseling the patient face to face. The total time spent in the appointment was 30 minutes.    Doroteo Bradford, MD 01/22/2014 3:14 PM  DISCLAIMER:  This note was dictated with voice recognition software.  Similar sounding words can inadvertently be transcribed inaccurately and may not be corrected  upon review.

## 2014-01-22 NOTE — Patient Instructions (Signed)
[facility] Discharge Instructions  RECOMMENDATIONS MADE BY THE CONSULTANT AND ANY TEST RESULTS WILL BE SENT TO YOUR REFERRING PHYSICIAN.  EXAM FINDINGS BY THE PHYSICIAN TODAY AND SIGNS OR SYMPTOMS TO REPORT TO CLINIC OR PRIMARY PHYSICIAN: Exam and findings as discussed by Dr. Barnet Glasgow.  You are doing well.  Need to get your mammogram at the end of September.  Report any new lumps, bone pain, shortness of breath or other symptoms.  MEDICATIONS PRESCRIBED:  Continue the arimidex  INSTRUCTIONS/FOLLOW-UP: Follow-up in 6 months with labs and office visit.  Thank you for choosing [facility] to provide your oncology and hematology care.  To afford each patient quality time with our providers, please arrive at least 15 minutes before your scheduled appointment time.  With your help, our goal is to use those 15 minutes to complete the necessary work-up to ensure our physicians have the information they need to help with your evaluation and healthcare recommendations.    Effective January 1st, 2014, we ask that you re-schedule your appointment with our physicians should you arrive 10 or more minutes late for your appointment.  We strive to give you quality time with our providers, and arriving late affects you and other patients whose appointments are after yours.    Again, thank you for choosing [facility].  Our hope is that these requests will decrease the amount of time that you wait before being seen by our physicians.       _____________________________________________________________  Should you have questions after your visit to [facility], please contact our office at [phone] between the hours of 8:30 a.m. and 5:00 p.m.  Voicemails left after 4:30 p.m. will not be returned until the following business day.  For prescription refill requests, have your pharmacy contact our office with your prescription refill request.

## 2014-01-25 ENCOUNTER — Other Ambulatory Visit: Payer: Self-pay

## 2014-01-25 DIAGNOSIS — I10 Essential (primary) hypertension: Secondary | ICD-10-CM

## 2014-01-25 DIAGNOSIS — E039 Hypothyroidism, unspecified: Secondary | ICD-10-CM

## 2014-01-25 DIAGNOSIS — E785 Hyperlipidemia, unspecified: Secondary | ICD-10-CM

## 2014-02-02 LAB — COMPREHENSIVE METABOLIC PANEL
ALT: 19 U/L (ref 0–35)
AST: 23 U/L (ref 0–37)
Albumin: 4 g/dL (ref 3.5–5.2)
Alkaline Phosphatase: 42 U/L (ref 39–117)
BUN: 11 mg/dL (ref 6–23)
CO2: 29 mEq/L (ref 19–32)
Calcium: 9.1 mg/dL (ref 8.4–10.5)
Chloride: 105 mEq/L (ref 96–112)
Creat: 0.74 mg/dL (ref 0.50–1.10)
Glucose, Bld: 93 mg/dL (ref 70–99)
Potassium: 3.9 mEq/L (ref 3.5–5.3)
Sodium: 141 mEq/L (ref 135–145)
Total Bilirubin: 0.7 mg/dL (ref 0.2–1.2)
Total Protein: 6.3 g/dL (ref 6.0–8.3)

## 2014-02-02 LAB — LIPID PANEL
Cholesterol: 151 mg/dL (ref 0–200)
HDL: 58 mg/dL (ref >39)
LDL Cholesterol: 79 mg/dL (ref 0–99)
Total CHOL/HDL Ratio: 2.6 Ratio
Triglycerides: 68 mg/dL (ref <150)
VLDL: 14 mg/dL (ref 0–40)

## 2014-02-02 LAB — TSH: TSH: 2.912 u[IU]/mL (ref 0.350–4.500)

## 2014-02-18 ENCOUNTER — Telehealth (HOSPITAL_COMMUNITY): Payer: Self-pay | Admitting: Hematology and Oncology

## 2014-02-18 NOTE — Telephone Encounter (Signed)
REF APPROVAL FOR 01/22/14-04/22/14 4 VISITS EXP 04/22/14

## 2014-03-03 ENCOUNTER — Ambulatory Visit (INDEPENDENT_AMBULATORY_CARE_PROVIDER_SITE_OTHER): Payer: Medicare HMO | Admitting: Family Medicine

## 2014-03-03 ENCOUNTER — Encounter: Payer: Self-pay | Admitting: Family Medicine

## 2014-03-03 VITALS — BP 128/80 | HR 70 | Resp 16 | Ht 65.0 in | Wt 186.0 lb

## 2014-03-03 DIAGNOSIS — S72009S Fracture of unspecified part of neck of unspecified femur, sequela: Secondary | ICD-10-CM

## 2014-03-03 DIAGNOSIS — M1990 Unspecified osteoarthritis, unspecified site: Secondary | ICD-10-CM

## 2014-03-03 DIAGNOSIS — E785 Hyperlipidemia, unspecified: Secondary | ICD-10-CM

## 2014-03-03 DIAGNOSIS — C50919 Malignant neoplasm of unspecified site of unspecified female breast: Secondary | ICD-10-CM

## 2014-03-03 DIAGNOSIS — Z23 Encounter for immunization: Secondary | ICD-10-CM

## 2014-03-03 DIAGNOSIS — S72002S Fracture of unspecified part of neck of left femur, sequela: Secondary | ICD-10-CM

## 2014-03-03 DIAGNOSIS — I10 Essential (primary) hypertension: Secondary | ICD-10-CM

## 2014-03-03 DIAGNOSIS — E039 Hypothyroidism, unspecified: Secondary | ICD-10-CM

## 2014-03-03 DIAGNOSIS — I2699 Other pulmonary embolism without acute cor pulmonale: Secondary | ICD-10-CM

## 2014-03-03 DIAGNOSIS — C50912 Malignant neoplasm of unspecified site of left female breast: Secondary | ICD-10-CM

## 2014-03-03 MED ORDER — LOVASTATIN 20 MG PO TABS: 20.0000 mg | ORAL_TABLET | Freq: Every day | ORAL | Status: DC

## 2014-03-03 MED ORDER — POTASSIUM CHLORIDE CRYS ER 10 MEQ PO TBCR: 10.0000 meq | EXTENDED_RELEASE_TABLET | Freq: Two times a day (BID) | ORAL | Status: DC

## 2014-03-03 MED ORDER — LEVOTHYROXINE SODIUM 100 MCG PO TABS: 100.0000 ug | ORAL_TABLET | Freq: Every morning | ORAL | Status: DC

## 2014-03-03 MED ORDER — HYDROCHLOROTHIAZIDE 12.5 MG PO CAPS: 12.5000 mg | ORAL_CAPSULE | Freq: Every morning | ORAL | Status: DC

## 2014-03-03 NOTE — Assessment & Plan Note (Signed)
Controlled, no change in medication Hyperlipidemia:Low fat diet discussed and encouraged.  \ 

## 2014-03-03 NOTE — Progress Notes (Signed)
   Subjective:    Patient ID: Lisa Klein, female    DOB: 1930/04/17, 80 y.o.   MRN: [id number]  HPI The PT is here for follow up and re-evaluation of chronic medical conditions, medication management and review of any available recent lab and radiology data.  Preventive health is updated, specifically  Cancer screening and Immunization.   Seen by omcologyThe PT denies any adverse reactions to current medications since the last visit.  There are  new concerns. Increased urinary incontinence noted, denies frequency or malodor or dysuria       Review of Systems See HPI Denies recent fever or chills. Denies sinus pressure, nasal congestion, ear pain or sore throat. Denies chest congestion, productive cough or wheezing. Denies chest pains, palpitations and leg swelling Denies abdominal pain, nausea, vomiting,diarrhea or constipation.   Denies dysuria, frequency, hesitancy or incontinence. C/o  joint pain,  and limitation in mobility. Denies headaches, seizures, numbness, or tingling. Denies depression, anxiety or insomnia. Denies skin break down or rash.        Objective:   Physical Exam  BP 128/80  Pulse 70  Resp 16  Ht 5\' 5"  (1.651 m)  Wt 186 lb (84.369 kg)  BMI 30.95 kg/m2  SpO2 97% Patient alert and oriented and in no cardiopulmonary distress.  HEENT: No facial asymmetry, EOMI,   oropharynx pink and moist.  Neck supple no JVD, no mass.  Chest: Clear to auscultation bilaterally.  CVS: S1, S2 no murmurs, no S3.Regular rate.  ABD: Soft non tender.   Ext: No edema  MS: Adequate though reduced  ROM spine, shoulders, hips and knees.  Skin: Intact, no ulcerations or rash noted.  Psych: Good eye contact, normal affect. Memory intact not anxious or depressed appearing.  CNS: CN 2-12 intact, power,  normal throughout.no focal deficits noted.       Assessment & Plan:  HYPERTENSION Controlled, no change in medication   HYPOTHYROIDISM Controlled, no  change in medication   HYPERLIPIDEMIA Controlled, no change in medication Hyperlipidemia:Low fat diet discussed and encouraged.    Infiltrating ductal carcinoma of breast 3 years out from initial diagnosis and doing well, followed by oncology  DEGENERATIVE JOINT DISEASE Home Safety reviewed, ambulates with cane, no falls, limitation inn getting to bathroom on time, will hold on med  Need for vaccination with 13-polyvalent pneumococcal conjugate vaccine prevnar administered  Hip fracture, left Maintained on bisphosphonate weekly  Pulmonary embolism Maintain on anti coagulant

## 2014-03-03 NOTE — Assessment & Plan Note (Signed)
3 years out from initial diagnosis and doing well, followed by oncology

## 2014-03-03 NOTE — Patient Instructions (Signed)
Annual physical exam in early  December, call if you need me before  Prevnar today  Labs and bP are excellent, no changes in medication

## 2014-03-03 NOTE — Assessment & Plan Note (Signed)
prevnar administered

## 2014-03-03 NOTE — Assessment & Plan Note (Signed)
Controlled, no change in medication  

## 2014-03-03 NOTE — Assessment & Plan Note (Addendum)
Home Safety reviewed, ambulates with cane, no falls, limitation inn getting to bathroom on time, will hold on med

## 2014-04-19 NOTE — Assessment & Plan Note (Signed)
Maintained on bisphosphonate weekly

## 2014-04-19 NOTE — Assessment & Plan Note (Signed)
Maintain on anti coagulant

## 2014-04-19 NOTE — Addendum Note (Signed)
Addended by: Eual Fines on: 04/19/2014 08:10 AM   Modules accepted: Orders

## 2014-07-05 ENCOUNTER — Other Ambulatory Visit: Payer: Self-pay | Admitting: Family Medicine

## 2014-07-05 DIAGNOSIS — Z1231 Encounter for screening mammogram for malignant neoplasm of breast: Secondary | ICD-10-CM

## 2014-07-07 ENCOUNTER — Ambulatory Visit (HOSPITAL_COMMUNITY)
Admission: RE | Admit: 2014-07-07 | Discharge: 2014-07-07 | Disposition: A | Discharge status: Home / Self Care | Payer: Commercial Managed Care - HMO | Source: Ambulatory Visit | Attending: Family Medicine | Admitting: Family Medicine

## 2014-07-07 DIAGNOSIS — Z1231 Encounter for screening mammogram for malignant neoplasm of breast: Secondary | ICD-10-CM | POA: Diagnosis not present

## 2014-07-15 ENCOUNTER — Encounter: Payer: Self-pay | Admitting: Family Medicine

## 2014-07-15 ENCOUNTER — Ambulatory Visit (INDEPENDENT_AMBULATORY_CARE_PROVIDER_SITE_OTHER): Payer: Medicare HMO

## 2014-07-15 ENCOUNTER — Ambulatory Visit (INDEPENDENT_AMBULATORY_CARE_PROVIDER_SITE_OTHER): Payer: Medicare HMO | Admitting: Family Medicine

## 2014-07-15 VITALS — BP 138/80 | HR 70 | Resp 16 | Ht 65.0 in | Wt 191.0 lb

## 2014-07-15 DIAGNOSIS — Z0000 Encounter for general adult medical examination without abnormal findings: Secondary | ICD-10-CM | POA: Insufficient documentation

## 2014-07-15 DIAGNOSIS — Z1211 Encounter for screening for malignant neoplasm of colon: Secondary | ICD-10-CM

## 2014-07-15 DIAGNOSIS — Z23 Encounter for immunization: Secondary | ICD-10-CM

## 2014-07-15 DIAGNOSIS — E039 Hypothyroidism, unspecified: Secondary | ICD-10-CM

## 2014-07-15 LAB — HEMOCCULT GUIAC POC 1CARD (OFFICE): Fecal Occult Blood, POC: NEGATIVE

## 2014-07-15 LAB — BASIC METABOLIC PANEL
BUN: 13 mg/dL (ref 6–23)
CALCIUM: 9.1 mg/dL (ref 8.4–10.5)
CO2: 30 meq/L (ref 19–32)
Chloride: 103 mEq/L (ref 96–112)
Creat: 0.85 mg/dL (ref 0.50–1.10)
GLUCOSE: 81 mg/dL (ref 70–99)
POTASSIUM: 3.9 meq/L (ref 3.5–5.3)
Sodium: 140 mEq/L (ref 135–145)

## 2014-07-15 LAB — TSH: TSH: 1.417 u[IU]/mL (ref 0.350–4.500)

## 2014-07-15 NOTE — Patient Instructions (Signed)
F/u in 4.5 month, call if you need me before  TSH and chem 7 today   Flu vaccine (righht arm) today   All the best for 2016!

## 2014-07-15 NOTE — Progress Notes (Signed)
   Subjective:    Patient ID: Lisa Klein, female    DOB: 07/10/1930, 80 y.o.   MRN: [id number]  HPI Patient is in for annual physical exam. Flu vaccine is administered at visit as it is due. Pt has no health concerns which need to be addressed at visit   Review of Systems See HPI     Objective:   Physical Exam BP 138/80 mmHg  Pulse 70  Resp 16  Ht 5\' 5"  (1.651 m)  Wt 191 lb (86.637 kg)  BMI 31.78 kg/m2  SpO2 97%   Pleasant , elderly overweight female, alert and oriented x 3, in no cardio-pulmonary distress. Afebrile. HEENT No facial trauma or asymetry. Sinuses non tender.  Extra occullar muscles intact, pupils equally reactive to light. External ears normal, tympanic membranes clear. Oropharynx moist, no exudate, poor dentition. Neck: adequate ROM,, no adenopathy,JVD or thyromegaly.No bruits.  Chest: Clear to ascultation bilaterally.No crackles or wheezes. Non tender to palpation  Breast: Right breast no masses or lumps. No tenderness. No nipple discharge or inversion. No axillary or supraclavicular adenopathy Left mastectomy, no adenopathy on exm of left axilla or supraclavicular area  Cardiovascular system; Heart sounds normal,  S1 and  S2 ,no S3.  No murmur, or thrill. Apical beat not displaced Peripheral pulses normal.  Abdomen: Soft, non tender, no organomegaly or masses. No bruits. Bowel sounds normal. No guarding, tenderness or rebound.  Rectal:  Normal sphincter tone. No mass.No rectal masses.  Guaiac negative stool.  GU: External genitalia normal female genitalia , female distribution of hair. No lesions. Urethral meatus normal in size, no  Prolapse, no lesions visibly  Present. Bladder non tender. Vagina pink and moist , with no visible lesions , discharge present . Adequate pelvic support no  cystocele or rectocele noted  Uterus absent, no adnexal masses, no adnexal tenderness.   Musculoskeletal exam: Decreased ROM of spine, hips  , shoulders and knees.  deformity ,swelling and  crepitus noted in knees No muscle wasting or atrophy.   Neurologic: Cranial nerves 2 to 12 intact. Power, tone ,sensation and reflexes normal throughout. Abnormal gait. Due to severe arthritis.  Skin: Intact, no ulceration, erythema , scaling or rash noted. Pigmentation normal throughout  Psych; Normal mood and affect. Judgement and concentration normal        Assessment & Plan:  Encounter for annual physical exam Annual exam as documented. Counseling done  re healthy lifestyle involving commitment to daily physical activity as able, heart healthy diet, and attaining healthy weight.The importance of adequate sleep also discussed. Regular seat belt use and home safety, is also discussed.  Immunization and cancer screening needs are specifically addressed at this visit.   Need for prophylactic vaccination and inoculation against influenza Vaccine administered at visit.

## 2014-07-16 NOTE — Assessment & Plan Note (Signed)
Vaccine administered at visit.  

## 2014-07-16 NOTE — Assessment & Plan Note (Signed)
Annual exam as documented. Counseling done  re healthy lifestyle involving commitment to daily physical activity as able, heart healthy diet, and attaining healthy weight.The importance of adequate sleep also discussed. Regular seat belt use and home safety, is also discussed.  Immunization and cancer screening needs are specifically addressed at this visit.

## 2014-07-21 ENCOUNTER — Encounter (HOSPITAL_COMMUNITY): Payer: Commercial Managed Care - HMO | Attending: Hematology and Oncology

## 2014-07-21 DIAGNOSIS — I82409 Acute embolism and thrombosis of unspecified deep veins of unspecified lower extremity: Secondary | ICD-10-CM | POA: Diagnosis not present

## 2014-07-21 DIAGNOSIS — D6859 Other primary thrombophilia: Secondary | ICD-10-CM | POA: Diagnosis not present

## 2014-07-21 DIAGNOSIS — C50912 Malignant neoplasm of unspecified site of left female breast: Secondary | ICD-10-CM | POA: Diagnosis present

## 2014-07-21 DIAGNOSIS — C50919 Malignant neoplasm of unspecified site of unspecified female breast: Secondary | ICD-10-CM

## 2014-07-21 LAB — COMPREHENSIVE METABOLIC PANEL
ALK PHOS: 45 U/L (ref 39–117)
ALT: 18 U/L (ref 0–35)
AST: 26 U/L (ref 0–37)
Albumin: 3.5 g/dL (ref 3.5–5.2)
Anion gap: 11 (ref 5–15)
BUN: 14 mg/dL (ref 6–23)
CALCIUM: 9.4 mg/dL (ref 8.4–10.5)
CO2: 27 mEq/L (ref 19–32)
CREATININE: 0.9 mg/dL (ref 0.50–1.10)
Chloride: 104 mEq/L (ref 96–112)
GFR calc non Af Amer: 58 mL/min — ABNORMAL LOW (ref >90)
GFR, EST AFRICAN AMERICAN: 67 mL/min — AB (ref >90)
GLUCOSE: 91 mg/dL (ref 70–99)
Potassium: 4 mEq/L (ref 3.7–5.3)
Sodium: 142 mEq/L (ref 137–147)
TOTAL PROTEIN: 7.1 g/dL (ref 6.0–8.3)
Total Bilirubin: 0.7 mg/dL (ref 0.3–1.2)

## 2014-07-21 LAB — CBC WITH DIFFERENTIAL/PLATELET
BASOS ABS: 0.1 10*3/uL (ref 0.0–0.1)
Basophils Relative: 2 % — ABNORMAL HIGH (ref 0–1)
EOS ABS: 0.1 10*3/uL (ref 0.0–0.7)
Eosinophils Relative: 2 % (ref 0–5)
HCT: 38.1 % (ref 36.0–46.0)
Hemoglobin: 12.1 g/dL (ref 12.0–15.0)
LYMPHS PCT: 41 % (ref 12–46)
Lymphs Abs: 1.4 10*3/uL (ref 0.7–4.0)
MCH: 28.4 pg (ref 26.0–34.0)
MCHC: 31.8 g/dL (ref 30.0–36.0)
MCV: 89.4 fL (ref 78.0–100.0)
Monocytes Absolute: 0.5 10*3/uL (ref 0.1–1.0)
Monocytes Relative: 13 % — ABNORMAL HIGH (ref 3–12)
Neutro Abs: 1.4 10*3/uL — ABNORMAL LOW (ref 1.7–7.7)
Neutrophils Relative %: 42 % — ABNORMAL LOW (ref 43–77)
PLATELETS: 168 10*3/uL (ref 150–400)
RBC: 4.26 MIL/uL (ref 3.87–5.11)
RDW: 15.1 % (ref 11.5–15.5)
WBC: 3.4 10*3/uL — AB (ref 4.0–10.5)

## 2014-07-21 NOTE — Progress Notes (Signed)
LABS FOR CEA,CA2729,CBCD,CMP 

## 2014-07-22 LAB — CEA: CEA: 1 ng/mL (ref 0.0–5.0)

## 2014-07-22 LAB — CANCER ANTIGEN 27.29: CA 27.29: 14 U/mL (ref 0–39)

## 2014-07-23 ENCOUNTER — Encounter (HOSPITAL_COMMUNITY): Payer: Self-pay

## 2014-07-23 ENCOUNTER — Encounter (HOSPITAL_BASED_OUTPATIENT_CLINIC_OR_DEPARTMENT_OTHER): Payer: Commercial Managed Care - HMO

## 2014-07-23 VITALS — BP 111/61 | HR 88 | Temp 98.1°F | Resp 16 | Wt 191.0 lb

## 2014-07-23 DIAGNOSIS — I82409 Acute embolism and thrombosis of unspecified deep veins of unspecified lower extremity: Secondary | ICD-10-CM

## 2014-07-23 DIAGNOSIS — Z86718 Personal history of other venous thrombosis and embolism: Secondary | ICD-10-CM

## 2014-07-23 DIAGNOSIS — D72819 Decreased white blood cell count, unspecified: Secondary | ICD-10-CM

## 2014-07-23 DIAGNOSIS — Z7901 Long term (current) use of anticoagulants: Secondary | ICD-10-CM

## 2014-07-23 DIAGNOSIS — C50912 Malignant neoplasm of unspecified site of left female breast: Secondary | ICD-10-CM

## 2014-07-23 DIAGNOSIS — D6859 Other primary thrombophilia: Secondary | ICD-10-CM

## 2014-07-23 MED ORDER — WARFARIN SODIUM 5 MG PO TABS: ORAL_TABLET | ORAL | Status: DC

## 2014-07-23 NOTE — Patient Instructions (Signed)
[facility] Discharge Instructions  RECOMMENDATIONS MADE BY THE CONSULTANT AND ANY TEST RESULTS WILL BE SENT TO YOUR REFERRING PHYSICIAN.  EXAM FINDINGS BY THE PHYSICIAN TODAY AND SIGNS OR SYMPTOMS TO REPORT TO CLINIC OR PRIMARY PHYSICIAN: Exam and findings as discussed by Dr. Barnet Glasgow.  We will stop the xarleto and put you on coumadin.  You will need to take this at 6pm each evening.  Report unexplained weight loss, any new lumps, bone pain, unusual bruising or bleeding, etc.  MEDICATIONS PRESCRIBED:  Coumadin 5 mg daily   INSTRUCTIONS/FOLLOW-UP: Labs in 1 week, labs and office visit in 1 month.  Thank you for choosing [facility] to provide your oncology and hematology care.  To afford each patient quality time with our providers, please arrive at least 15 minutes before your scheduled appointment time.  With your help, our goal is to use those 15 minutes to complete the necessary work-up to ensure our physicians have the information they need to help with your evaluation and healthcare recommendations.    Effective January 1st, 2014, we ask that you re-schedule your appointment with our physicians should you arrive 10 or more minutes late for your appointment.  We strive to give you quality time with our providers, and arriving late affects you and other patients whose appointments are after yours.    Again, thank you for choosing [facility].  Our hope is that these requests will decrease the amount of time that you wait before being seen by our physicians.       _____________________________________________________________  Should you have questions after your visit to [facility], please contact our office at [phone] between the hours of 8:30 a.m. and 4:30 p.m.  Voicemails left after 4:30 p.m. will not be returned until the following business day.  For prescription refill requests, have your pharmacy contact our office with  your prescription refill request.    _______________________________________________________________  We hope that we have given you very good care.  You may receive a patient satisfaction survey in the mail, please complete it and return it as soon as possible.  We value your feedback!  _______________________________________________________________  Have you asked about our STAR program?  STAR stands for Survivorship Training and Rehabilitation, and this is a nationally recognized cancer care program that focuses on survivorship and rehabilitation.  Cancer and cancer treatments may cause problems, such as, pain, making you feel tired and keeping you from doing the things that you need or want to do. Cancer rehabilitation can help. Our goal is to reduce these troubling effects and help you have the best quality of life possible.  You may receive a survey from a nurse that asks questions about your current state of health.  Based on the survey results, all eligible patients will be referred to the [facility] program for an evaluation so we can better serve you!  A frequently asked questions sheet is available upon request.  Warfarin: What You Need to Know Warfarin is an anticoagulant. Anticoagulants help prevent the formation of blood clots. They also help stop the growth of blood clots. Warfarin is sometimes referred to as a "blood thinner."  Normally, when body tissues are cut or damaged, the blood clots in order to prevent blood loss. Sometimes clots form inside your blood vessels and obstruct the flow of blood through your circulatory system (thrombosis). These clots may travel through your bloodstream and become lodged in smaller blood vessels in  your brain, which can cause a stroke, or in your lungs (pulmonary embolism). WHO SHOULD USE WARFARIN? Warfarin is prescribed for people at risk of developing harmful blood clots:  People with surgically implanted mechanical heart valves, irregular heart  rhythms called atrial fibrillation, and certain clotting disorders.  People who have developed harmful blood clotting in the past, including those who have had a stroke or a pulmonary embolism, or thrombosis in their legs (deep vein thrombosis [DVT]).  People with an existing blood clot, such as a pulmonary embolism. WARFARIN DOSING Warfarin tablets come in different strengths. Each tablet strength is a different color, with the amount of warfarin (in milligrams) clearly printed on the tablet. If the color of your tablet is different than usual when you receive a new prescription, report it immediately to your pharmacist or health care provider. WARFARIN MONITORING The goal of warfarin therapy is to lessen the clotting tendency of blood but not prevent clotting completely. Your health care provider will monitor the anticoagulation effect of warfarin closely and adjust your dose as needed. For your safety, blood tests called prothrombin time (PT) or international normalized ratio (INR) are used to measure the effects of warfarin. Both of these tests can be done with a finger stick or a blood draw. The longer it takes the blood to clot, the higher the PT or INR. Your health care provider will inform you of your "target" PT or INR range. If, at any time, your PT or INR is above the target range, there is a risk of bleeding. If your PT or INR is below the target range, there is a risk of clotting. Whether you are started on warfarin while you are in the hospital or in your health care provider's office, you will need to have your PT or INR checked within one week of starting the medicine. Initially, some people are asked to have their PT or INR checked as much as twice a week. Once you are on a stable maintenance dose, the PT or INR is checked less often, usually once every 2 to 4 weeks. The warfarin dose may be adjusted if the PT or INR is not within the target range. It is important to keep all laboratory  and health care provider follow-up appointments. Not keeping appointments could result in a chronic or permanent injury, pain, or disability because warfarin is a medicine that requires close monitoring. WHAT ARE THE SIDE EFFECTS OF WARFARIN?  Too much warfarin can cause bleeding (hemorrhage) from any part of the body. This may include bleeding from the gums, blood in the urine, bloody or dark stools, a nosebleed that is not easily stopped, coughing up blood, or vomiting blood.  Too little warfarin can increase the risk of blood clots.  Too little or too much warfarin can also increase the risk of a stroke.  Warfarin use may cause a skin rash or irritation, an unusual fever, continual nausea or stomach upset, or severe pain in your joints or back. SPECIAL PRECAUTIONS WHILE TAKING WARFARIN Warfarin should be taken exactly as directed. It is very important to take warfarin as directed since bleeding or blood clots could result in chronic or permanent injury, pain, or disability.  Take your medicine at the same time every day. If you forget to take your dose, you can take it if it is within 6 hours of when it was due.  Do not change the dose of warfarin on your own to make up for missed or extra  doses.  If you miss more than 2 doses in a row, you should contact your health care provider for advice. Avoid situations that cause bleeding. You may have a tendency to bleed more easily than usual while taking warfarin. The following actions can limit bleeding:  Using a softer toothbrush.  Flossing with waxed floss rather than unwaxed floss.  Shaving with an Copy rather than a blade.  Limiting the use of sharp objects.  Avoiding potentially harmful activities, such as contact sports. Warfarin and Pregnancy or Breastfeeding  Warfarin is not advised during the first trimester of pregnancy due to an increased risk of birth defects. In certain situations, a woman may take warfarin after  her first trimester of pregnancy. A woman who becomes pregnant or plans to become pregnant while taking warfarin should notify her health care provider immediately.  Although warfarin does not pass into breast milk, a woman who wishes to breastfeed while taking warfarin should also consult with her health care provider. Alcohol, Smoking, and Illicit Drug Use  Alcohol affects how warfarin works in the body. It is best to avoid alcoholic drinks or consume very small amounts while taking warfarin. In general, alcohol intake should be limited to 1 oz (30 mL) of liquor, 6 oz (180 mL) of wine, or 12 oz (360 mL) of beer each day. Notify your health care provider if you change your alcohol intake.  Smoking affects how warfarin works. It is best to avoid smoking while taking warfarin. Notify your health care provider if you change your smoking habits.  It is best to avoid all illicit drugs while taking warfarin since there are few studies that show how warfarin interacts with these drugs. Other Medicines and Dietary Supplements Many prescription and over-the-counter medicines can interfere with warfarin. Be sure all of your health care providers know you are taking warfarin. Notify your health care provider who prescribed warfarin for you or your pharmacist before starting or stopping any new medicines, including over-the-counter vitamins, dietary supplements, and pain medicines. Your warfarin dose may need to be adjusted. Some common over-the-counter medicines that may increase the risk of bleeding while taking warfarin include:   Acetaminophen.  Aspirin.  Nonsteroidal anti-inflammatory medicines (NSAIDs), such as ibuprofen or naproxen.  Vitamin E. Dietary Considerations  Foods that have moderate or high amounts of vitamin K can interfere with warfarin. Avoid major changes in your diet or notify your health care provider before changing your diet. Eat a consistent amount of foods that have moderate or  high amounts of vitamin K. Eating less foods containing vitamin K can increase the risk of bleeding. Eating more foods containing vitamin K can increase the risk of blood clots. Additional questions about dietary considerations can be discussed with a dietitian. Foods that are very high in vitamin K:  Greens, such as Swiss chard and beet, collard, mustard, or turnip greens (fresh or frozen, cooked).  Kale (fresh or frozen, cooked).  Parsley (raw).  Spinach (cooked). Foods that are high in vitamin K:  Asparagus (frozen, cooked).  Beans, green (frozen, cooked).  Broccoli.  Bok choy (cooked).  Brussels sprouts (fresh or frozen, cooked).  Cabbage (cooked).   Coleslaw. Foods that are moderately high in vitamin K:  Blueberries.  Black-eyed peas.  Endive (raw).  Green leaf lettuce (raw).  Green scallions (raw).  Kale (raw).  Okra (frozen, cooked).  Plantains (fried).  Romaine lettuce (raw).  Sauerkraut (canned).  Spinach (raw). CALL YOUR CLINIC OR HEALTH CARE PROVIDER IF YOU:  Plan to have any surgery or procedure.  Feel sick, especially if you have diarrhea or vomiting.  Experience or anticipate any major changes in your diet.  Start or stop a prescription or over-the-counter medicine.  Become, plan to become, or think you may be pregnant.  Are having heavier than usual menstrual periods.  Have had a fall, accident, or any symptoms of bleeding or unusual bruising.  Develop an unusual fever. CALL 911 IN THE [place] OR GO TO THE EMERGENCY DEPARTMENT IF YOU:   Think you may be having an allergic reaction to warfarin. The signs of an allergic reaction could include itching, rash, hives, swelling, chest tightness, or trouble breathing.  See signs of blood in your urine. The signs could include reddish, pinkish, or tea-colored urine.  See signs of blood in your stools. The signs could include bright red or black stools.  Vomit or cough up blood. In these  instances, the blood could have either a bright red or a "coffee-grounds" appearance.  Have bleeding that will not stop after applying pressure for 30 minutes such as cuts, nosebleeds, or other injuries.  Have severe pain in your joints or back.  Have a new and severe headache.  Have sudden weakness or numbness of your face, arm, or leg, especially on one side of your body.  Have sudden confusion or trouble understanding.  Have sudden trouble seeing in one or both eyes.  Have sudden trouble walking, dizziness, loss of balance, or coordination.  Have trouble speaking or understanding (aphasia). Document Released: 07/23/2005 Document Revised: 12/07/2013 Document Reviewed: 01/16/2013 [facility] Patient Information 2015 [place], [place]. This information is not intended to replace advice given to you by your health care provider. Make sure you discuss any questions you have with your health care provider.

## 2014-07-23 NOTE — Progress Notes (Signed)
[facility]  OFFICE PROGRESS NOTE  [name], MD [address], [address] [place] [address]  DIAGNOSIS: Infiltrating ductal carcinoma of breast, left  Thrombophilia  Chief Complaint  Patient presents with  . Breast Cancer  . Thrombophilia    CURRENT THERAPY: Anastrozole 1 mg daily. Cannot afford Xarelto. Has to tablets left.  INTERVAL HISTORY: DAMA HEDGEPETH 80 y.o. female returns for followup of locally advanced left breast cancer, status post left modified radical mastectomy followed by 6 cycles of epirubicin/Cytoxan for a 1.5 cm tumor with 6 of 7 lymph nodes involved, ER/PR positive, HER-2/neu not overexpressed with Ki-67 of 45%, status post radiotherapy currently taking anastrozole 1 mg daily started in March of 2013. The patient underwent a left hip open reduction and internal fixation after falling sustaining a fracture on 01/19/2013.  She does feel dizzy at times. She denies any chest pain, PND, orthopnea, or worsening swelling of the left upper extremity. She denies any abdominal pain, nausea, vomiting, diarrhea, constipation, epistaxis, melena, hematochezia, hematuria, or vaginal bleeding. Vaginal dryness is present without significant hot flashes or worsening joint pain.    MEDICAL HISTORY: Past Medical History  Diagnosis Date  . DJD (degenerative joint disease)   . Hypertension   . Hypothyroidism   . Obesity   . Degenerative disc disease     with nerve compression   . Hypothyroidism   . Hypertension   . Clotting disorder 05/2011    dvt, right leg  . Cancer 2012    LEFT BREAST    INTERIM HISTORY: has Hypothyroidism; HYPERLIPIDEMIA; HYPERTENSION; DEGENERATIVE JOINT DISEASE; Infiltrating ductal carcinoma of breast; DVT (deep venous thrombosis); Routine general medical examination at a health care facility; Pulmonary embolism; Hip fracture, left; Need for vaccination with 13-polyvalent pneumococcal conjugate  vaccine; Encounter for annual physical exam; and Need for prophylactic vaccination and inoculation against influenza on her problem list.    ALLERGIES:  is allergic to listerine.  MEDICATIONS: has a current medication list which includes the following prescription(s): acetaminophen, alendronate, anastrozole, [MAR Hold] aspirin ec, calcium-vitamin d, docusate sodium, hydrochlorothiazide, levothyroxine, lovastatin, potassium chloride, [MAR Hold] pyridoxine, rivaroxaban, warfarin, and warfarin, and the following Facility-Administered Medications: heparin lock flush, heparin lock flush, sodium chloride, and sodium chloride.  SURGICAL HISTORY:  Past Surgical History  Procedure Laterality Date  . Vesicovaginal fistula closure w/ tah      [facility]  . Cholecystectomy  80'S    [facility]  . Mastectomy modified radical  02/14/11    left  . Portacath placement  04/16/2011    Procedure: INSERTION PORT-A-CATH;  Surgeon: Jamesetta So;  Location: [facility];  Service: General;  Laterality: Right;  right subclavian  . Cholecystectomy    . Breast surgery      left total mastectomy  . Port-a-cath removal N/A 12/05/2012    Procedure: MINOR REMOVAL PORT-A-CATH;  Surgeon: Jamesetta So, MD;  Location: [facility];  Service: General;  Laterality: N/A;  In Minor Room  . Hip pinning,cannulated Left 01/19/2013    Procedure: CANNULATED HIP PINNING-  left;  Surgeon: Marin Shutter, MD;  Location: [facility];  Service: Orthopedics;  Laterality: Left;    FAMILY HISTORY: family history includes COPD in her father; Hypertension in her sister; Lung disease in her father. There is no history of Anesthesia problems, Malignant hyperthermia, Hypotension, or Pseudochol deficiency.  SOCIAL HISTORY:  reports that she has never smoked. She has never used smokeless tobacco.  She reports that she does not drink alcohol or use illicit drugs.  REVIEW OF SYSTEMS:  Other than that discussed above is noncontributory.  PHYSICAL EXAMINATION: ECOG PERFORMANCE  STATUS: 1 - Symptomatic but completely ambulatory  Blood pressure 111/61, pulse 88, temperature 98.1 F (36.7 C), temperature source Oral, resp. rate 16, weight 191 lb (86.637 kg), SpO2 99 %.  GENERAL:alert, no distress and comfortable. Moderately obese. SKIN: skin color, texture, turgor are normal, no rashes or significant lesions EYES: PERLA; Conjunctiva are pink and non-injected, sclera clear SINUSES: No redness or tenderness over maxillary or ethmoid sinuses OROPHARYNX:no exudate, no erythema on lips, buccal mucosa, or tongue. NECK: supple, thyroid normal size, non-tender, without nodularity. No masses CHEST: Status post left mastectomy with induration of radiation with no subcutaneous nodules. Right breast without mass. LYMPH:  no palpable lymphadenopathy in the cervical, axillary or inguinal LUNGS: clear to auscultation and percussion with normal breathing effort HEART: regular rate & rhythm and no murmurs. ABDOMEN:abdomen soft, non-tender and normal bowel sounds. Liver and spleen not enlarged. No ascites.  MUSCULOSKELETAL:no cyanosis of digits and no clubbing. Range of motion normal.  NEURO: alert & oriented x 3 with fluent speech, no focal motor/sensory deficits   LABORATORY DATA: Lab on 07/21/2014  Component Date Value Ref Range Status  . WBC 07/21/2014 3.4* 4.0 - 10.5 K/uL Final  . RBC 07/21/2014 4.26  3.87 - 5.11 MIL/uL Final  . Hemoglobin 07/21/2014 12.1  12.0 - 15.0 g/dL Final  . HCT 07/21/2014 38.1  36.0 - 46.0 % Final  . MCV 07/21/2014 89.4  78.0 - 100.0 fL Final  . MCH 07/21/2014 28.4  26.0 - 34.0 pg Final  . MCHC 07/21/2014 31.8  30.0 - 36.0 g/dL Final  . RDW 07/21/2014 15.1  11.5 - 15.5 % Final  . Platelets 07/21/2014 168  150 - 400 K/uL Final  . Neutrophils Relative % 07/21/2014 42* 43 - 77 % Final  . Neutro Abs 07/21/2014 1.4* 1.7 - 7.7 K/uL Final  . Lymphocytes Relative 07/21/2014 41  12 - 46 % Final  . Lymphs Abs 07/21/2014 1.4  0.7 - 4.0 K/uL Final  .  Monocytes Relative 07/21/2014 13* 3 - 12 % Final  . Monocytes Absolute 07/21/2014 0.5  0.1 - 1.0 K/uL Final  . Eosinophils Relative 07/21/2014 2  0 - 5 % Final  . Eosinophils Absolute 07/21/2014 0.1  0.0 - 0.7 K/uL Final  . Basophils Relative 07/21/2014 2* 0 - 1 % Final  . Basophils Absolute 07/21/2014 0.1  0.0 - 0.1 K/uL Final  . Sodium 07/21/2014 142  137 - 147 mEq/L Final  . Potassium 07/21/2014 4.0  3.7 - 5.3 mEq/L Final  . Chloride 07/21/2014 104  96 - 112 mEq/L Final  . CO2 07/21/2014 27  19 - 32 mEq/L Final  . Glucose, Bld 07/21/2014 91  70 - 99 mg/dL Final  . BUN 07/21/2014 14  6 - 23 mg/dL Final  . Creatinine, Ser 07/21/2014 0.90  0.50 - 1.10 mg/dL Final  . Calcium 07/21/2014 9.4  8.4 - 10.5 mg/dL Final  . Total Protein 07/21/2014 7.1  6.0 - 8.3 g/dL Final  . Albumin 07/21/2014 3.5  3.5 - 5.2 g/dL Final  . AST 07/21/2014 26  0 - 37 U/L Final  . ALT 07/21/2014 18  0 - 35 U/L Final  . Alkaline Phosphatase 07/21/2014 45  39 - 117 U/L Final  . Total Bilirubin 07/21/2014 0.7  0.3 - 1.2 mg/dL Final  . GFR calc  non Af Amer 07/21/2014 58* >90 mL/min Final  . GFR calc Af Amer 07/21/2014 67* >90 mL/min Final   Comment: (NOTE) The eGFR has been calculated using the CKD EPI equation. This calculation has not been validated in all clinical situations. eGFR's persistently <90 mL/min signify possible Chronic Kidney Disease.   . Anion gap 07/21/2014 11  5 - 15 Final  . CEA 07/21/2014 1.0  0.0 - 5.0 ng/mL Final   Performed at [facility]  . CA 27.29 07/21/2014 14  0 - 39 U/mL Final   Performed at [facility] Visit on 07/15/2014  Component Date Value Ref Range Status  . Sodium 07/15/2014 140  135 - 145 mEq/L Final  . Potassium 07/15/2014 3.9  3.5 - 5.3 mEq/L Final  . Chloride 07/15/2014 103  96 - 112 mEq/L Final  . CO2 07/15/2014 30  19 - 32 mEq/L Final  . Glucose, Bld 07/15/2014 81  70 - 99 mg/dL Final  . BUN 07/15/2014 13  6 - 23 mg/dL Final  . Creat  07/15/2014 0.85  0.50 - 1.10 mg/dL Final  . Calcium 07/15/2014 9.1  8.4 - 10.5 mg/dL Final  . TSH 07/15/2014 1.417  0.350 - 4.500 uIU/mL Final  . Fecal Occult Blood, POC 07/15/2014 Negative   Final   81829  5L  05/17  . Card #1 Date 07/15/2014 07/15/2014   Final    PATHOLOGY: No new pathology.  Urinalysis    Component Value Date/Time   COLORURINE YELLOW 01/19/2013 0105   APPEARANCEUR CLEAR 01/19/2013 0105   LABSPEC 1.018 01/19/2013 0105   PHURINE 6.0 01/19/2013 0105   GLUCOSEU NEGATIVE 01/19/2013 0105   HGBUR NEGATIVE 01/19/2013 0105   BILIRUBINUR NEGATIVE 01/19/2013 0105   BILIRUBINUR negative 07/17/2012 1058   KETONESUR NEGATIVE 01/19/2013 0105   PROTEINUR NEGATIVE 01/19/2013 0105   PROTEINUR negative 07/17/2012 1058   UROBILINOGEN 1.0 01/19/2013 0105   UROBILINOGEN 0.2 07/17/2012 1058   NITRITE NEGATIVE 01/19/2013 0105   NITRITE negative 07/17/2012 1058   LEUKOCYTESUR NEGATIVE 01/19/2013 0105    RADIOGRAPHIC STUDIES: Mm Digital Screening Unilat R  07/08/2014   CLINICAL DATA:  Screening.  EXAM: DIGITAL SCREENING UNILATERAL RIGHT MAMMOGRAM WITH CAD  COMPARISON:  Previous exam(s)  ACR Breast Density Category b: There are scattered areas of fibroglandular density.  FINDINGS: The patient has had a left mastectomy. There are no findings suspicious for malignancy. Images were processed with CAD.  IMPRESSION: No mammographic evidence of malignancy. A result letter of this screening mammogram will be mailed directly to the patient.  RECOMMENDATION: Screening mammogram in one year. (Code:[id number]-B-01Y)  BI-RADS CATEGORY  1: Negative.   Electronically Signed   By: Luberta Robertson M.D.   On: 07/08/2014 08:21    ASSESSMENT:  #1.Stage III-A adenocarcinoma the left breast status post modified radical mastectomy in 02/14/2011 followed by 6 cycles of epirubicin and Cytoxan with significant dose reductions followed by radiation therapy. She was then placed on Arimidex. She'll take that for 5 years.  This was a grade 3 cancer. She had multifocal invasive disease. The largest focus was 1.5 cm. She did have involving the deep surgical margin of resection. She had 6 of 7 positive nodes involved and axilla. ER +100%, PR +14%, Ki-67 are 45%, HER-2/neu nonamplified. She started Arimidex on 11/19/2011. Thus far she has no evidence recurrent disease with a negative PET scan in April 2014, tolerating anastrozole well, no evidence of disease.  #2. Status post left hip fracture, status post  fixation, completed rehabilitation and now living at home.  #3. Clotting disorder with 2 episodes of DVTs in the past, on lifelong Xarelto.  #4. Hypothyroidism, on treatment.  #5. Morbid obesity #6. Leukopenia secondary to previous chemotherapy.    PLAN:  1. Begin warfarin 5 mg daily until arrangements can be made to aid the patient's co-pay for Xarelto.  2. INR in 1 week. 3. Follow-up in one month with INR.   All questions were answered. The patient knows to call the clinic with any problems, questions or concerns. We can certainly see the patient much sooner if necessary.   I spent 25 minutes counseling the patient face to face. The total time spent in the appointment was 30 minutes.    Doroteo Bradford, MD 07/23/2014 1:47 PM  DISCLAIMER:  This note was dictated with voice recognition software.  Similar sounding words can inadvertently be transcribed inaccurately and may not be corrected upon review.

## 2014-07-23 NOTE — Addendum Note (Signed)
Addended by: Mellissa Kohut on: 07/23/2014 05:36 PM   Modules accepted: Orders

## 2014-07-24 ENCOUNTER — Other Ambulatory Visit: Payer: Self-pay | Admitting: Nurse Practitioner

## 2014-07-28 ENCOUNTER — Encounter (HOSPITAL_BASED_OUTPATIENT_CLINIC_OR_DEPARTMENT_OTHER): Payer: Commercial Managed Care - HMO

## 2014-07-28 DIAGNOSIS — C50919 Malignant neoplasm of unspecified site of unspecified female breast: Secondary | ICD-10-CM

## 2014-07-28 DIAGNOSIS — C50912 Malignant neoplasm of unspecified site of left female breast: Secondary | ICD-10-CM | POA: Diagnosis not present

## 2014-07-28 DIAGNOSIS — D6859 Other primary thrombophilia: Secondary | ICD-10-CM

## 2014-07-28 DIAGNOSIS — E039 Hypothyroidism, unspecified: Secondary | ICD-10-CM

## 2014-07-28 DIAGNOSIS — I82409 Acute embolism and thrombosis of unspecified deep veins of unspecified lower extremity: Secondary | ICD-10-CM

## 2014-07-28 DIAGNOSIS — I2699 Other pulmonary embolism without acute cor pulmonale: Secondary | ICD-10-CM

## 2014-07-28 LAB — PROTIME-INR
INR: 1.52 — ABNORMAL HIGH (ref 0.00–1.49)
Prothrombin Time: 18.4 seconds — ABNORMAL HIGH (ref 11.6–15.2)

## 2014-07-28 NOTE — Progress Notes (Signed)
Labs for pt/inr

## 2014-07-28 NOTE — Progress Notes (Signed)
Discussed with Dr. Whitney Muse and patient instructed to take coumadin 10 mg tonight then 5 mg daily and to return for next PT/INR on 08/02/14.  Verbalizes understanding.

## 2014-07-28 NOTE — Addendum Note (Signed)
Addended by: Mellissa Kohut on: 07/28/2014 01:47 PM   Modules accepted: Orders

## 2014-08-02 ENCOUNTER — Telehealth (HOSPITAL_COMMUNITY): Payer: Self-pay

## 2014-08-02 ENCOUNTER — Encounter (HOSPITAL_COMMUNITY): Payer: Commercial Managed Care - HMO

## 2014-08-02 ENCOUNTER — Other Ambulatory Visit: Payer: Self-pay

## 2014-08-02 DIAGNOSIS — I82409 Acute embolism and thrombosis of unspecified deep veins of unspecified lower extremity: Secondary | ICD-10-CM

## 2014-08-02 DIAGNOSIS — C50912 Malignant neoplasm of unspecified site of left female breast: Secondary | ICD-10-CM | POA: Diagnosis not present

## 2014-08-02 LAB — PROTIME-INR
INR: 2.21 — ABNORMAL HIGH (ref 0.00–1.49)
Prothrombin Time: 24.7 seconds — ABNORMAL HIGH (ref 11.6–15.2)

## 2014-08-02 MED ORDER — LOVASTATIN 20 MG PO TABS: 20.0000 mg | ORAL_TABLET | Freq: Every day | ORAL | Status: DC

## 2014-08-02 MED ORDER — HYDROCHLOROTHIAZIDE 12.5 MG PO CAPS: 12.5000 mg | ORAL_CAPSULE | Freq: Every morning | ORAL | Status: DC

## 2014-08-02 MED ORDER — ALENDRONATE SODIUM 70 MG PO TABS: 70.0000 mg | ORAL_TABLET | ORAL | Status: DC

## 2014-08-02 MED ORDER — LEVOTHYROXINE SODIUM 100 MCG PO TABS: 100.0000 ug | ORAL_TABLET | Freq: Every morning | ORAL | Status: DC

## 2014-08-02 MED ORDER — POTASSIUM CHLORIDE CRYS ER 10 MEQ PO TBCR: 10.0000 meq | EXTENDED_RELEASE_TABLET | Freq: Two times a day (BID) | ORAL | Status: DC

## 2014-08-02 NOTE — Telephone Encounter (Signed)
Call back confirmation received.  Verbalized understanding of instructions.

## 2014-08-02 NOTE — Telephone Encounter (Signed)
Message left for patient to continue coumadin 5 mg daily and to return for next PT/INR on 08/09/14.  Call back confirmation requested.

## 2014-08-09 ENCOUNTER — Encounter (HOSPITAL_COMMUNITY): Payer: Commercial Managed Care - HMO | Attending: Hematology & Oncology

## 2014-08-09 ENCOUNTER — Telehealth (HOSPITAL_COMMUNITY): Payer: Self-pay

## 2014-08-09 DIAGNOSIS — Z7901 Long term (current) use of anticoagulants: Secondary | ICD-10-CM

## 2014-08-09 DIAGNOSIS — D6859 Other primary thrombophilia: Secondary | ICD-10-CM | POA: Diagnosis not present

## 2014-08-09 DIAGNOSIS — I82409 Acute embolism and thrombosis of unspecified deep veins of unspecified lower extremity: Secondary | ICD-10-CM | POA: Insufficient documentation

## 2014-08-09 DIAGNOSIS — C50912 Malignant neoplasm of unspecified site of left female breast: Secondary | ICD-10-CM | POA: Insufficient documentation

## 2014-08-09 DIAGNOSIS — Z86718 Personal history of other venous thrombosis and embolism: Secondary | ICD-10-CM

## 2014-08-09 LAB — PROTIME-INR
INR: 2.31 — ABNORMAL HIGH (ref 0.00–1.49)
Prothrombin Time: 25.6 seconds — ABNORMAL HIGH (ref 11.6–15.2)

## 2014-08-09 NOTE — Progress Notes (Signed)
LABS FOR PT/INR

## 2014-08-09 NOTE — Telephone Encounter (Signed)
Call back confirmation received.  Verbalized understanding of instructions.

## 2014-08-09 NOTE — Telephone Encounter (Signed)
-----   Message from Baird Cancer, PA-C sent at 08/09/2014  4:43 PM EST ----- Good.  Same dose.  INR in 3 weeks

## 2014-08-09 NOTE — Telephone Encounter (Signed)
Message left for patient and  instructed to continue  coumadin same dosage and to return for next PT/INR on 08/24/14 as scheduled.  Call back confirmation requested.

## 2014-08-24 ENCOUNTER — Encounter (HOSPITAL_COMMUNITY): Payer: Self-pay | Admitting: Hematology & Oncology

## 2014-08-24 ENCOUNTER — Encounter (HOSPITAL_BASED_OUTPATIENT_CLINIC_OR_DEPARTMENT_OTHER): Payer: Commercial Managed Care - HMO

## 2014-08-24 ENCOUNTER — Ambulatory Visit (HOSPITAL_COMMUNITY): Payer: Medicare HMO | Admitting: Hematology & Oncology

## 2014-08-24 ENCOUNTER — Encounter (HOSPITAL_BASED_OUTPATIENT_CLINIC_OR_DEPARTMENT_OTHER): Payer: Commercial Managed Care - HMO | Admitting: Hematology & Oncology

## 2014-08-24 VITALS — BP 129/68 | HR 64 | Temp 98.2°F | Resp 18 | Wt 190.2 lb

## 2014-08-24 DIAGNOSIS — C50912 Malignant neoplasm of unspecified site of left female breast: Secondary | ICD-10-CM | POA: Diagnosis not present

## 2014-08-24 DIAGNOSIS — I82409 Acute embolism and thrombosis of unspecified deep veins of unspecified lower extremity: Secondary | ICD-10-CM

## 2014-08-24 DIAGNOSIS — M8580 Other specified disorders of bone density and structure, unspecified site: Secondary | ICD-10-CM | POA: Diagnosis not present

## 2014-08-24 DIAGNOSIS — D6859 Other primary thrombophilia: Secondary | ICD-10-CM | POA: Diagnosis not present

## 2014-08-24 DIAGNOSIS — Z170 Estrogen receptor positive status [ER+]: Secondary | ICD-10-CM | POA: Diagnosis not present

## 2014-08-24 HISTORY — DX: Other specified disorders of bone density and structure, unspecified site: M85.80

## 2014-08-24 LAB — COMPREHENSIVE METABOLIC PANEL
ALT: 25 U/L (ref 0–35)
ANION GAP: 5 (ref 5–15)
AST: 28 U/L (ref 0–37)
Albumin: 3.9 g/dL (ref 3.5–5.2)
Alkaline Phosphatase: 42 U/L (ref 39–117)
BUN: 14 mg/dL (ref 6–23)
CO2: 28 mmol/L (ref 19–32)
CREATININE: 0.73 mg/dL (ref 0.50–1.10)
Calcium: 8.9 mg/dL (ref 8.4–10.5)
Chloride: 107 mEq/L (ref 96–112)
GFR calc Af Amer: 90 mL/min — ABNORMAL LOW (ref >90)
GFR calc non Af Amer: 77 mL/min — ABNORMAL LOW (ref >90)
Glucose, Bld: 92 mg/dL (ref 70–99)
Potassium: 3.7 mmol/L (ref 3.5–5.1)
Sodium: 140 mmol/L (ref 135–145)
TOTAL PROTEIN: 6.8 g/dL (ref 6.0–8.3)
Total Bilirubin: 0.7 mg/dL (ref 0.3–1.2)

## 2014-08-24 LAB — PROTIME-INR
INR: 1.78 — ABNORMAL HIGH (ref 0.00–1.49)
Prothrombin Time: 20.9 seconds — ABNORMAL HIGH (ref 11.6–15.2)

## 2014-08-24 MED ORDER — ANASTROZOLE 1 MG PO TABS: 1.0000 mg | ORAL_TABLET | Freq: Every day | ORAL | Status: DC

## 2014-08-24 NOTE — Progress Notes (Signed)
[name], MD [address] [place] [address]  L breast cancer, s/p L modified radical mastectomy. 1.5cm tumor, 6/7 positive nodes. Stage IIIA ER+/PR+/Her-2 neu negative 6 cycles of Epirubicin/cytoxan, multiple dose reductions Adjuvant XRT Arimidex 1 mg daily 10/2011 2 prior DVT's, on Coumadin  Cholesterol checked 02/2014  05/05/2013 Bone Density with Osteopenia, on fosamax  CURRENT THERAPY: Arimidex  INTERVAL HISTORY: Lisa Klein 81 y.o. female returns for follow-up of her breast cancer. She continues on Arimidex. She has osteopenia. She is on Fosamax. Her mother passed in October at the age of 15. She takes calcium and vitamin D.  She has discontinued her Xarelto because of cost.  She is back on coumadin.  Dr. Moshe Cipro follows this.   She has no complaints. She is overall doing well. She is up to date on her mammogram. She has a tendency to stumble, she broke her hip by falling in her living room.  MEDICAL HISTORY: Past Medical History  Diagnosis Date  . DJD (degenerative joint disease)   . Hypertension   . Hypothyroidism   . Obesity   . Degenerative disc disease     with nerve compression   . Hypothyroidism   . Hypertension   . Clotting disorder 05/2011    dvt, right leg  . Cancer 2012    LEFT BREAST    has Hypothyroidism; HYPERLIPIDEMIA; HYPERTENSION; DEGENERATIVE JOINT DISEASE; Infiltrating ductal carcinoma of breast; DVT (deep venous thrombosis); Routine general medical examination at a health care facility; Pulmonary embolism; Hip fracture, left; Need for vaccination with 13-polyvalent pneumococcal conjugate vaccine; Encounter for annual physical exam; Need for prophylactic vaccination and inoculation against influenza; and Osteopenia on her problem list.     No history exists.     is allergic to listerine.  Ms. Dormer had no medications administered during this visit.  SURGICAL HISTORY: Past Surgical History  Procedure  Laterality Date  . Vesicovaginal fistula closure w/ tah      [facility]  . Cholecystectomy  80'S    [facility]  . Mastectomy modified radical  02/14/11    left  . Portacath placement  04/16/2011    Procedure: INSERTION PORT-A-CATH;  Surgeon: Jamesetta So;  Location: [facility];  Service: General;  Laterality: Right;  right subclavian  . Cholecystectomy    . Breast surgery      left total mastectomy  . Port-a-cath removal N/A 12/05/2012    Procedure: MINOR REMOVAL PORT-A-CATH;  Surgeon: Jamesetta So, MD;  Location: [facility];  Service: General;  Laterality: N/A;  In Minor Room  . Hip pinning,cannulated Left 01/19/2013    Procedure: CANNULATED HIP PINNING-  left;  Surgeon: Marin Shutter, MD;  Location: [facility];  Service: Orthopedics;  Laterality: Left;    SOCIAL HISTORY: History   Social History  . Marital Status: Widowed    Spouse Name: N/A    Number of Children: 2  . Years of Education: N/A   Occupational History  . [employer] part time     Social History Main Topics  . Smoking status: Never Smoker   . Smokeless tobacco: Never Used  . Alcohol Use: No  . Drug Use: No  . Sexual Activity: No   Other Topics Concern  . Not on file   Social History Narrative    FAMILY HISTORY: Family History  Problem Relation Age of Onset  . COPD Father   . Lung disease Father   . Hypertension Sister   .  Anesthesia problems Neg Hx   . Malignant hyperthermia Neg Hx   . Hypotension Neg Hx   . Pseudochol deficiency Neg Hx     Review of Systems  Constitutional: Negative.   HENT: Negative.   Eyes: Negative.   Musculoskeletal:       Walks with a  cane  Skin: Negative.   Endo/Heme/Allergies: Negative.   Psychiatric/Behavioral: Negative for depression, suicidal ideas, hallucinations, memory loss and substance abuse. The patient is not nervous/anxious and does not have insomnia.     PHYSICAL EXAMINATION  ECOG PERFORMANCE STATUS: 1 - Symptomatic but completely ambulatory  Filed Vitals:   08/24/14 1020  BP:  129/68  Pulse: 64  Temp: 98.2 F (36.8 C)  Resp: 18    Physical Exam  Constitutional: She is oriented to person, place, and time and well-developed, well-nourished, and in no distress.  HENT:  Head: Normocephalic and atraumatic.  Nose: Nose normal.  Mouth/Throat: Oropharynx is clear and moist. No oropharyngeal exudate.  Eyes: Conjunctivae and EOM are normal. Pupils are equal, round, and reactive to light. Right eye exhibits no discharge. Left eye exhibits no discharge. No scleral icterus.  Neck: Normal range of motion. Neck supple. No tracheal deviation present. No thyromegaly present.  Cardiovascular: Normal rate, regular rhythm and normal heart sounds.  Exam reveals no gallop and no friction rub.   No murmur heard. Pulmonary/Chest: Effort normal and breath sounds normal. She has no wheezes. She has no rales.  Abdominal: Soft. Bowel sounds are normal. She exhibits no distension and no mass. There is no tenderness. There is no rebound and no guarding.  Musculoskeletal: Normal range of motion. She exhibits no edema.  Lymphadenopathy:    She has no cervical adenopathy.  Neurological: She is alert and oriented to person, place, and time. She has normal reflexes. No cranial nerve deficit. Gait normal. Coordination normal.  Skin: Skin is warm and dry. No rash noted.  Psychiatric: Mood, memory, affect and judgment normal.  Nursing note and vitals reviewed.   LABORATORY DATA:  CBC    Component Value Date/Time   WBC 3.4* 07/21/2014 0958   RBC 4.26 07/21/2014 0958   HGB 12.1 07/21/2014 0958   HCT 38.1 07/21/2014 0958   PLT 168 07/21/2014 0958   MCV 89.4 07/21/2014 0958   MCH 28.4 07/21/2014 0958   MCHC 31.8 07/21/2014 0958   RDW 15.1 07/21/2014 0958   LYMPHSABS 1.4 07/21/2014 0958   MONOABS 0.5 07/21/2014 0958   EOSABS 0.1 07/21/2014 0958   BASOSABS 0.1 07/21/2014 0958   CMP     Component Value Date/Time   NA 140 08/24/2014 1132   K 3.7 08/24/2014 1132   CL 107 08/24/2014  1132   CO2 28 08/24/2014 1132   GLUCOSE 92 08/24/2014 1132   BUN 14 08/24/2014 1132   CREATININE 0.73 08/24/2014 1132   CREATININE 0.85 07/15/2014 1116   CALCIUM 8.9 08/24/2014 1132   PROT 6.8 08/24/2014 1132   ALBUMIN 3.9 08/24/2014 1132   AST 28 08/24/2014 1132   ALT 25 08/24/2014 1132   ALKPHOS 42 08/24/2014 1132   BILITOT 0.7 08/24/2014 1132   GFRNONAA 77* 08/24/2014 1132   GFRNONAA 83 09/26/2011 0955   GFRAA 90* 08/24/2014 1132   GFRAA >89 09/26/2011 0955    Notes Recorded by [name], MD on 02/03/2014 at 4:26 PM [employer];lent labs       Ref Range 4moago  151yrgo  2y79yro     Cholesterol 0 - 200  mg/dL 151 137CM 152CM   Comments: ATP III Classification:   < 200    mg/dL    Desirable   200 - 239   mg/dL    Borderline High   >= 240    mg/dL    High     Triglycerides <150 mg/dL 68 94 86    HDL >39 mg/dL 58 39 mg/dL" class="rz_1a" style="cursor: pointer;" onmouseover='jscript: var varStyle="underline"; var bgColor="#[id number]"; this.style.backgroundColor=bgColor; var children=this.getElementsByTagName("div"); for(var child=0;child 56 39 mg/dL" class="rz_1b" style="cursor: pointer;" onmouseover='jscript: var varStyle="underline"; var bgColor="#[id number]"; this.style.backgroundColor=bgColor; var children=this.getElementsByTagName("div"); for(var child=0;child 51    Total CHOL/HDL Ratio Ratio 2.6 2.4 3.0    VLDL 0 - 40 mg/dL _0 LDL Cholesterol 0 - 99 mg/dL 79 62CM          ASSESSMENT and THERAPY PLAN:    Infiltrating ductal carcinoma of breast Pleasant 81 year old female with a history of stage III ER+, Her 2 - carcinoma of the breast. She is currently on Arimidex which she started in early 2013.  She has no major complaints.  She is up to date on mammography, her breast exam is benign.  I have advised her to continue on her arimidex daily, calcium and vitamin D.  We will see her back in 6 months with labs. She is up to  date on her DEXA and cholesterol screening.    Osteopenia She has osteopenia on a recent DEXA, she also has a history of a hip fracture after a fall.  Given her ongoing arimidex therapy I discussed data in regards to the use of PROLIA for women with breast cancer on an AI.  She is interested in pursuing this.  I have advised her she will need to stop her weekly Fosamax.  She understands.  We will check a vitamin D level on her next set of labs as well.  She will receive formal prolia teaching at her first injection appointment     All questions were answered. The patient knows to call the clinic with any problems, questions or concerns. We can certainly see the patient much sooner if necessary.   Molli Hazard 08/24/2014

## 2014-08-24 NOTE — Patient Instructions (Addendum)
[place] at [facility]  Discharge Instructions:  Please call with any problems or concerns prior to follow-up Continue on your arimidex every day.  We will see you back in 6 months  _______________________________________________________________  Thank you for choosing [place] at [facility] to provide your oncology and hematology care.  To afford each patient quality time with our providers, please arrive at least 15 minutes before your scheduled appointment.  You need to re-schedule your appointment if you arrive 10 or more minutes late.  We strive to give you quality time with our providers, and arriving late affects you and other patients whose appointments are after yours.  Also, if you no show three or more times for appointments you may be dismissed from the clinic.  Again, thank you for choosing [place] at [facility] hope is that these requests will allow you access to exceptional care and in a timely manner. _______________________________________________________________  If you have questions after your visit, please contact our office at [phone] between the hours of 8:30 a.m. and 5:00 p.m. Voicemails left after 4:30 p.m. will not be returned until the following business day. _______________________________________________________________  For prescription refill requests, have your pharmacy contact our office. _______________________________________________________________  Recommendations made by the consultant and any test results will be sent to your referring physician. _______________________________________________________________Denosumab injection What is this medicine? DENOSUMAB (den oh sue mab) slows bone breakdown. Prolia is used to treat osteoporosis in women after menopause and in men. Delton See is used to prevent bone fractures and other bone problems caused by cancer bone metastases.  Delton See is also used to treat giant cell tumor of the bone. This medicine may be used for other purposes; ask your health care provider or pharmacist if you have questions. COMMON BRAND NAME(S): Prolia, XGEVA What should I tell my health care provider before I take this medicine? They need to know if you have any of these conditions: -dental disease -eczema -infection or history of infections -kidney disease or on dialysis -low blood calcium or vitamin D -malabsorption syndrome -scheduled to have surgery or tooth extraction -taking medicine that contains denosumab -thyroid or parathyroid disease -an unusual reaction to denosumab, other medicines, foods, dyes, or preservatives -pregnant or trying to get pregnant -breast-feeding How should I use this medicine? This medicine is for injection under the skin. It is given by a health care professional in a hospital or clinic setting. If you are getting Prolia, a special MedGuide will be given to you by the pharmacist with each prescription and refill. Be sure to read this information carefully each time. For Prolia, talk to your pediatrician regarding the use of this medicine in children. Special care may be needed. For Delton See, talk to your pediatrician regarding the use of this medicine in children. While this drug may be prescribed for children as young as 13 years for selected conditions, precautions do apply. Overdosage: If you think you've taken too much of this medicine contact a poison control center or emergency room at once. Overdosage: If you think you have taken too much of this medicine contact a poison control center or emergency room at once. NOTE: This medicine is only for you. Do not share this medicine with others. What if I miss a dose? It is important not to miss your dose. Call your doctor or health care professional if you are unable to keep an appointment. What may interact with this medicine? Do  not take this medicine with  any of the following medications: -other medicines containing denosumab This medicine may also interact with the following medications: -medicines that suppress the immune system -medicines that treat cancer -steroid medicines like prednisone or cortisone This list may not describe all possible interactions. Give your health care provider a list of all the medicines, herbs, non-prescription drugs, or dietary supplements you use. Also tell them if you smoke, drink alcohol, or use illegal drugs. Some items may interact with your medicine. What should I watch for while using this medicine? Visit your doctor or health care professional for regular checks on your progress. Your doctor or health care professional may order blood tests and other tests to see how you are doing. Call your doctor or health care professional if you get a cold or other infection while receiving this medicine. Do not treat yourself. This medicine may decrease your body's ability to fight infection. You should make sure you get enough calcium and vitamin D while you are taking this medicine, unless your doctor tells you not to. Discuss the foods you eat and the vitamins you take with your health care professional. See your dentist regularly. Brush and floss your teeth as directed. Before you have any dental work done, tell your dentist you are receiving this medicine. Do not become pregnant while taking this medicine or for 5 months after stopping it. Women should inform their doctor if they wish to become pregnant or think they might be pregnant. There is a potential for serious side effects to an unborn child. Talk to your health care professional or pharmacist for more information. What side effects may I notice from receiving this medicine? Side effects that you should report to your doctor or health care professional as soon as possible: -allergic reactions like skin rash, itching or hives, swelling of the face, lips, or  tongue -breathing problems -chest pain -fast, irregular heartbeat -feeling faint or lightheaded, falls -fever, chills, or any other sign of infection -muscle spasms, tightening, or twitches -numbness or tingling -skin blisters or bumps, or is dry, peels, or red -slow healing or unexplained pain in the mouth or jaw -unusual bleeding or bruising Side effects that usually do not require medical attention (Report these to your doctor or health care professional if they continue or are bothersome.): -muscle pain -stomach upset, gas This list may not describe all possible side effects. Call your doctor for medical advice about side effects. You may report side effects to FDA at 1-800-FDA-1088. Where should I keep my medicine? This medicine is only given in a clinic, doctor's office, or other health care setting and will not be stored at home. NOTE: This sheet is a summary. It may not cover all possible information. If you have questions about this medicine, talk to your doctor, pharmacist, or health care provider.  2015, Elsevier/Gold Standard. (2012-01-21 12:37:47)

## 2014-08-24 NOTE — Assessment & Plan Note (Signed)
Pleasant 81 year old female with a history of stage III ER+, Her 2 - carcinoma of the breast. She is currently on Arimidex which she started in early 2013.  She has no major complaints.  She is up to date on mammography, her breast exam is benign.  I have advised her to continue on her arimidex daily, calcium and vitamin D.  We will see her back in 6 months with labs. She is up to date on her DEXA and cholesterol screening.

## 2014-08-24 NOTE — Progress Notes (Signed)
LABS FOR PT/INR.Marland KitchenADDED VD25,CMP

## 2014-08-24 NOTE — Assessment & Plan Note (Signed)
She has osteopenia on a recent DEXA, she also has a history of a hip fracture after a fall.  Given her ongoing arimidex therapy I discussed data in regards to the use of PROLIA for women with breast cancer on an AI.  She is interested in pursuing this.  I have advised her she will need to stop her weekly Fosamax.  She understands.  We will check a vitamin D level on her next set of labs as well.  She will receive formal prolia teaching at her first injection appointment

## 2014-08-25 LAB — VITAMIN D 25 HYDROXY (VIT D DEFICIENCY, FRACTURES): Vit D, 25-Hydroxy: 37 ng/mL (ref 30.0–100.0)

## 2014-08-31 ENCOUNTER — Ambulatory Visit (HOSPITAL_COMMUNITY): Payer: Commercial Managed Care - HMO

## 2014-08-31 ENCOUNTER — Other Ambulatory Visit (HOSPITAL_COMMUNITY): Payer: Medicare HMO

## 2014-09-01 ENCOUNTER — Ambulatory Visit (HOSPITAL_COMMUNITY): Payer: Commercial Managed Care - HMO

## 2014-09-01 ENCOUNTER — Encounter (HOSPITAL_BASED_OUTPATIENT_CLINIC_OR_DEPARTMENT_OTHER): Payer: Commercial Managed Care - HMO

## 2014-09-01 ENCOUNTER — Other Ambulatory Visit (HOSPITAL_COMMUNITY): Payer: Commercial Managed Care - HMO

## 2014-09-01 DIAGNOSIS — I82409 Acute embolism and thrombosis of unspecified deep veins of unspecified lower extremity: Secondary | ICD-10-CM | POA: Diagnosis not present

## 2014-09-01 DIAGNOSIS — S72002S Fracture of unspecified part of neck of left femur, sequela: Secondary | ICD-10-CM

## 2014-09-01 DIAGNOSIS — M899 Disorder of bone, unspecified: Secondary | ICD-10-CM

## 2014-09-01 DIAGNOSIS — D6859 Other primary thrombophilia: Secondary | ICD-10-CM | POA: Diagnosis not present

## 2014-09-01 DIAGNOSIS — C50912 Malignant neoplasm of unspecified site of left female breast: Secondary | ICD-10-CM | POA: Diagnosis not present

## 2014-09-01 LAB — PROTIME-INR
INR: 2.19 — AB (ref 0.00–1.49)
PROTHROMBIN TIME: 24.5 s — AB (ref 11.6–15.2)

## 2014-09-01 MED ORDER — DENOSUMAB 60 MG/ML ~~LOC~~ SOLN
60.0000 mg | Freq: Once | SUBCUTANEOUS | Status: AC
  Administered 2014-09-01: 60 mg via SUBCUTANEOUS
  Filled 2014-09-01: qty 1

## 2014-09-01 NOTE — Progress Notes (Signed)
Labs for pt/inr

## 2014-09-01 NOTE — Progress Notes (Signed)
Lisa Klein presents today for injection per MD orders. prolia administered SQ in right Abdomen. Administration without incident. Patient tolerated well.

## 2014-09-01 NOTE — Progress Notes (Signed)
Patient notified to continue coumadin 5 mg daily and to return for next INR on 09/15/14.  Verbalized understanding of instructions.

## 2014-09-08 DIAGNOSIS — H524 Presbyopia: Secondary | ICD-10-CM | POA: Diagnosis not present

## 2014-09-08 DIAGNOSIS — H5210 Myopia, unspecified eye: Secondary | ICD-10-CM | POA: Diagnosis not present

## 2014-09-15 ENCOUNTER — Encounter (HOSPITAL_COMMUNITY): Payer: Commercial Managed Care - HMO | Attending: Hematology & Oncology

## 2014-09-15 DIAGNOSIS — D6859 Other primary thrombophilia: Secondary | ICD-10-CM | POA: Insufficient documentation

## 2014-09-15 DIAGNOSIS — M8580 Other specified disorders of bone density and structure, unspecified site: Secondary | ICD-10-CM | POA: Diagnosis not present

## 2014-09-15 DIAGNOSIS — C50912 Malignant neoplasm of unspecified site of left female breast: Secondary | ICD-10-CM | POA: Insufficient documentation

## 2014-09-15 DIAGNOSIS — I82409 Acute embolism and thrombosis of unspecified deep veins of unspecified lower extremity: Secondary | ICD-10-CM | POA: Insufficient documentation

## 2014-09-15 LAB — PROTIME-INR
INR: 2.05 — ABNORMAL HIGH (ref 0.00–1.49)
Prothrombin Time: 23.3 seconds — ABNORMAL HIGH (ref 11.6–15.2)

## 2014-09-15 NOTE — Progress Notes (Signed)
LABS DRAWN

## 2014-09-28 DIAGNOSIS — Z0100 Encounter for examination of eyes and vision without abnormal findings: Secondary | ICD-10-CM | POA: Diagnosis not present

## 2014-10-04 ENCOUNTER — Encounter: Payer: Self-pay | Admitting: *Deleted

## 2014-10-06 ENCOUNTER — Encounter (HOSPITAL_COMMUNITY): Payer: Commercial Managed Care - HMO | Attending: Hematology & Oncology

## 2014-10-06 DIAGNOSIS — I82409 Acute embolism and thrombosis of unspecified deep veins of unspecified lower extremity: Secondary | ICD-10-CM

## 2014-10-06 DIAGNOSIS — D6859 Other primary thrombophilia: Secondary | ICD-10-CM | POA: Diagnosis not present

## 2014-10-06 DIAGNOSIS — C50912 Malignant neoplasm of unspecified site of left female breast: Secondary | ICD-10-CM | POA: Insufficient documentation

## 2014-10-06 DIAGNOSIS — M8580 Other specified disorders of bone density and structure, unspecified site: Secondary | ICD-10-CM | POA: Diagnosis not present

## 2014-10-06 LAB — PROTIME-INR
INR: 1.74 — ABNORMAL HIGH (ref 0.00–1.49)
PROTHROMBIN TIME: 20.5 s — AB (ref 11.6–15.2)

## 2014-10-06 NOTE — Progress Notes (Signed)
LABS FOR PT/INR

## 2014-10-06 NOTE — Progress Notes (Signed)
Patient notified to take coumadin 7.5 mg today then 5 mg daily and pt inr in 7-10 days

## 2014-10-13 ENCOUNTER — Other Ambulatory Visit (HOSPITAL_COMMUNITY): Payer: Self-pay | Admitting: Oncology

## 2014-10-13 DIAGNOSIS — C50912 Malignant neoplasm of unspecified site of left female breast: Secondary | ICD-10-CM

## 2014-10-13 DIAGNOSIS — I82409 Acute embolism and thrombosis of unspecified deep veins of unspecified lower extremity: Secondary | ICD-10-CM

## 2014-10-13 DIAGNOSIS — D6859 Other primary thrombophilia: Secondary | ICD-10-CM

## 2014-10-13 MED ORDER — WARFARIN SODIUM 5 MG PO TABS: ORAL_TABLET | ORAL | Status: DC

## 2014-10-14 ENCOUNTER — Encounter (HOSPITAL_BASED_OUTPATIENT_CLINIC_OR_DEPARTMENT_OTHER): Payer: Commercial Managed Care - HMO

## 2014-10-14 ENCOUNTER — Other Ambulatory Visit (HOSPITAL_COMMUNITY): Payer: Self-pay | Admitting: Oncology

## 2014-10-14 DIAGNOSIS — D6859 Other primary thrombophilia: Secondary | ICD-10-CM | POA: Diagnosis not present

## 2014-10-14 DIAGNOSIS — M8580 Other specified disorders of bone density and structure, unspecified site: Secondary | ICD-10-CM | POA: Diagnosis not present

## 2014-10-14 DIAGNOSIS — I82409 Acute embolism and thrombosis of unspecified deep veins of unspecified lower extremity: Secondary | ICD-10-CM | POA: Diagnosis not present

## 2014-10-14 DIAGNOSIS — C50912 Malignant neoplasm of unspecified site of left female breast: Secondary | ICD-10-CM

## 2014-10-14 LAB — PROTIME-INR
INR: 1.63 — ABNORMAL HIGH (ref 0.00–1.49)
Prothrombin Time: 19.5 seconds — ABNORMAL HIGH (ref 11.6–15.2)

## 2014-10-14 NOTE — Progress Notes (Signed)
LABS DRAWN

## 2014-10-15 NOTE — Progress Notes (Signed)
Pt instructed to alernate 7.5 with 5mg  of Coumadin starting with the 7.5mg  tonight. She verbalized understanding of how to do this. She will return on 3/18 @ 9:50 for INR.

## 2014-10-22 ENCOUNTER — Encounter (HOSPITAL_COMMUNITY): Payer: Commercial Managed Care - HMO

## 2014-10-22 DIAGNOSIS — D6859 Other primary thrombophilia: Secondary | ICD-10-CM | POA: Diagnosis not present

## 2014-10-22 DIAGNOSIS — I82409 Acute embolism and thrombosis of unspecified deep veins of unspecified lower extremity: Secondary | ICD-10-CM | POA: Diagnosis not present

## 2014-10-22 DIAGNOSIS — C50912 Malignant neoplasm of unspecified site of left female breast: Secondary | ICD-10-CM | POA: Diagnosis not present

## 2014-10-22 LAB — PROTIME-INR
INR: 2.24 — AB (ref 0.00–1.49)
Prothrombin Time: 25 seconds — ABNORMAL HIGH (ref 11.6–15.2)

## 2014-10-28 ENCOUNTER — Telehealth (HOSPITAL_COMMUNITY): Payer: Self-pay

## 2014-10-28 ENCOUNTER — Encounter (HOSPITAL_COMMUNITY): Payer: Self-pay

## 2014-10-28 LAB — PROTIME-INR
INR: 2.2 — AB (ref 0.9–1.1)
PROTIME: 25 s

## 2014-10-28 NOTE — Telephone Encounter (Signed)
Patient instructed to continue coumadin same dosage and to return for next PT/INR on 11/11/14.  Verbalizes understanding.

## 2014-10-28 NOTE — Telephone Encounter (Signed)
-----   Message from Louis Meckel sent at 10/28/2014 12:26 PM EDT ----- Regarding: Question  Patient needs a nurse to call her and let her know the amount of coumadin she needs to take.  Call her at [phone]

## 2014-11-08 ENCOUNTER — Encounter (HOSPITAL_COMMUNITY): Payer: Commercial Managed Care - HMO | Attending: Hematology & Oncology

## 2014-11-08 DIAGNOSIS — C50912 Malignant neoplasm of unspecified site of left female breast: Secondary | ICD-10-CM | POA: Diagnosis not present

## 2014-11-08 DIAGNOSIS — M8580 Other specified disorders of bone density and structure, unspecified site: Secondary | ICD-10-CM

## 2014-11-08 DIAGNOSIS — I82409 Acute embolism and thrombosis of unspecified deep veins of unspecified lower extremity: Secondary | ICD-10-CM

## 2014-11-08 DIAGNOSIS — D6859 Other primary thrombophilia: Secondary | ICD-10-CM

## 2014-11-08 LAB — PROTIME-INR
INR: 2.63 — ABNORMAL HIGH (ref 0.00–1.49)
Prothrombin Time: 28.3 seconds — ABNORMAL HIGH (ref 11.6–15.2)

## 2014-11-08 NOTE — Progress Notes (Signed)
Labs drawn

## 2014-11-18 ENCOUNTER — Ambulatory Visit: Payer: Medicare HMO | Admitting: Family Medicine

## 2014-11-18 ENCOUNTER — Encounter: Payer: Self-pay | Admitting: Family Medicine

## 2014-11-30 ENCOUNTER — Encounter (HOSPITAL_BASED_OUTPATIENT_CLINIC_OR_DEPARTMENT_OTHER): Payer: Commercial Managed Care - HMO

## 2014-11-30 ENCOUNTER — Other Ambulatory Visit (HOSPITAL_COMMUNITY): Payer: Self-pay | Admitting: Oncology

## 2014-11-30 DIAGNOSIS — C50912 Malignant neoplasm of unspecified site of left female breast: Secondary | ICD-10-CM

## 2014-11-30 DIAGNOSIS — D6859 Other primary thrombophilia: Secondary | ICD-10-CM

## 2014-11-30 DIAGNOSIS — I82409 Acute embolism and thrombosis of unspecified deep veins of unspecified lower extremity: Secondary | ICD-10-CM | POA: Diagnosis not present

## 2014-11-30 DIAGNOSIS — Z7901 Long term (current) use of anticoagulants: Secondary | ICD-10-CM

## 2014-11-30 LAB — PROTIME-INR
INR: 2.63 — ABNORMAL HIGH (ref 0.00–1.49)
PROTHROMBIN TIME: 28.3 s — AB (ref 11.6–15.2)

## 2014-11-30 MED ORDER — WARFARIN SODIUM 5 MG PO TABS: 5.0000 mg | ORAL_TABLET | Freq: Once | ORAL | Status: DC

## 2014-11-30 NOTE — Progress Notes (Signed)
Labs drawn

## 2014-12-07 ENCOUNTER — Ambulatory Visit (INDEPENDENT_AMBULATORY_CARE_PROVIDER_SITE_OTHER): Payer: Commercial Managed Care - HMO | Admitting: Family Medicine

## 2014-12-07 VITALS — BP 126/82 | HR 100 | Resp 18 | Ht 65.0 in | Wt 188.1 lb

## 2014-12-07 DIAGNOSIS — Z7901 Long term (current) use of anticoagulants: Secondary | ICD-10-CM

## 2014-12-07 DIAGNOSIS — E785 Hyperlipidemia, unspecified: Secondary | ICD-10-CM | POA: Diagnosis not present

## 2014-12-07 DIAGNOSIS — E669 Obesity, unspecified: Secondary | ICD-10-CM

## 2014-12-07 DIAGNOSIS — I10 Essential (primary) hypertension: Secondary | ICD-10-CM | POA: Diagnosis not present

## 2014-12-07 DIAGNOSIS — E039 Hypothyroidism, unspecified: Secondary | ICD-10-CM

## 2014-12-07 DIAGNOSIS — M8580 Other specified disorders of bone density and structure, unspecified site: Secondary | ICD-10-CM

## 2014-12-07 HISTORY — DX: Long term (current) use of anticoagulants: Z79.01

## 2014-12-07 NOTE — Patient Instructions (Addendum)
Annual wellness in 4 month, please CALL IF YOU NEED ME BEFORE  bLOO D PRESSURE AND EXAM ARE GOOD  Fasting lipid, cmp and TSH this week asap please  Thanks for choosing [facility], we consider it a privelige to serve you.

## 2014-12-08 LAB — COMPREHENSIVE METABOLIC PANEL
ALBUMIN: 3.7 g/dL (ref 3.5–5.2)
ALT: 19 U/L (ref 0–35)
AST: 26 U/L (ref 0–37)
Alkaline Phosphatase: 34 U/L — ABNORMAL LOW (ref 39–117)
BUN: 13 mg/dL (ref 6–23)
CO2: 27 mEq/L (ref 19–32)
Calcium: 8.9 mg/dL (ref 8.4–10.5)
Chloride: 106 mEq/L (ref 96–112)
Creat: 0.83 mg/dL (ref 0.50–1.10)
Glucose, Bld: 88 mg/dL (ref 70–99)
Potassium: 4 mEq/L (ref 3.5–5.3)
SODIUM: 140 meq/L (ref 135–145)
Total Bilirubin: 0.8 mg/dL (ref 0.2–1.2)
Total Protein: 6.4 g/dL (ref 6.0–8.3)

## 2014-12-08 LAB — LIPID PANEL
CHOL/HDL RATIO: 2.5 ratio
Cholesterol: 143 mg/dL (ref 0–200)
HDL: 58 mg/dL (ref >=46)
LDL Cholesterol: 72 mg/dL (ref 0–99)
Triglycerides: 63 mg/dL (ref <150)
VLDL: 13 mg/dL (ref 0–40)

## 2014-12-09 LAB — TSH: TSH: 1.955 u[IU]/mL (ref 0.350–4.500)

## 2014-12-13 ENCOUNTER — Encounter: Payer: Self-pay | Admitting: Family Medicine

## 2014-12-13 DIAGNOSIS — E669 Obesity, unspecified: Secondary | ICD-10-CM | POA: Insufficient documentation

## 2014-12-13 NOTE — Assessment & Plan Note (Signed)
Controlled, no change in medication DASH diet and commitment to daily physical activity for a minimum of 30 minutes discussed and encouraged, as a part of hypertension management. The importance of attaining a healthy weight is also discussed.  BP/Weight 12/07/2014 09/01/2014 08/24/2014 07/23/2014 07/15/2014 03/03/2014 6/65/9935  Systolic BP 701 779 390 300 923 300 762  Diastolic BP 82 64 68 61 80 80 67  Wt. (Lbs) 188.12 - 190.2 191 191 186 188.2  BMI 31.3 - 31.65 31.78 31.78 30.95 30.39

## 2014-12-13 NOTE — Progress Notes (Signed)
Subjective:    Patient ID: Lisa Klein, female    DOB: 11/25/1929, 81 y.o.   MRN: [id number]  HPI The PT is here for follow up and re-evaluation of chronic medical conditions, medication management and review of any available recent lab and radiology data.  Preventive health is updated, specifically  Cancer screening and Immunization.   Questions or concerns regarding consultations or procedures which the PT has had in the interim are  addressed. The PT denies any adverse reactions to current medications since the last visit.  There are no new concerns.  There are no specific complaints       Review of Systems See HPI Denies recent fever or chills. Denies sinus pressure, nasal congestion, ear pain or sore throat. Denies chest congestion, productive cough or wheezing. Denies chest pains, palpitations and leg swelling Denies abdominal pain, nausea, vomiting,diarrhea or constipation.   Denies dysuria, frequency, hesitancy or incontinence. Chronic and unchanged  joint pain,  and limitation in mobility. Denies headaches, seizures, numbness, or tingling. Denies depression, anxiety or insomnia. Denies skin break down or rash.        Objective:   Physical Exam BP 126/82 mmHg  Pulse 100  Resp 18  Ht 5\' 5"  (1.651 m)  Wt 188 lb 1.9 oz (85.331 kg)  BMI 31.30 kg/m2  SpO2 97% Patient alert and oriented and in no cardiopulmonary distress.  HEENT: No facial asymmetry, EOMI,   oropharynx pink and moist.  Neck supple no JVD, no mass.  Chest: Clear to auscultation bilaterally.  CVS: S1, S2 no murmurs, no S3.Regular rate.  ABD: Soft non tender.   Ext: No edema  MS: Decreased  ROM spine, shoulders, hips and knees.  Skin: Intact, no ulcerations or rash noted.  Psych: Good eye contact, normal affect. Memory intact not anxious or depressed appearing.  CNS: CN 2-12 intact, power,  normal throughout.no focal deficits noted.        Assessment & Plan:  Essential  hypertension Controlled, no change in medication DASH diet and commitment to daily physical activity for a minimum of 30 minutes discussed and encouraged, as a part of hypertension management. The importance of attaining a healthy weight is also discussed.  BP/Weight 12/07/2014 09/01/2014 08/24/2014 07/23/2014 07/15/2014 03/03/2014 02/06/5008  Systolic BP 381 829 937 169 678 938 101  Diastolic BP 82 64 68 61 80 80 67  Wt. (Lbs) 188.12 - 190.2 191 191 186 188.2  BMI 31.3 - 31.65 31.78 31.78 30.95 30.39         Hypothyroidism Controlled, no change in medication    Hyperlipemia Controlled, no change in medication Hyperlipidemia:Low fat diet discussed and encouraged.   Lipid Panel  Lab Results  Component Value Date   CHOL 143 12/07/2014   HDL 58 12/07/2014   LDLCALC 72 12/07/2014   TRIG 63 12/07/2014   CHOLHDL 2.5 12/07/2014         Long term current use of anticoagulant therapy Followed in coumadi clinic, denies epistaxis, bruising hematuria or rectal bleeding   Osteopenia Has had hip fracture, treated with Prolia IM  Through heme/onc clinic   Obesity Unchanged. Patient re-educated about  the importance of commitment to a  minimum of 150 minutes of exercise per week.  The importance of healthy food choices with portion control discussed. Encouraged to start a food diary, count calories and to consider  joining a support group. Sample diet sheets offered. Goals set by the patient for the next several months.   Weight /  BMI 12/07/2014 08/24/2014 07/23/2014  WEIGHT 188 lb 1.9 oz 190 lb 3.2 oz 191 lb  HEIGHT 5\' 5"  - -  BMI 31.3 kg/m2 31.65 kg/m2 31.78 kg/m2    Current exercise per week 90 minutes.

## 2014-12-13 NOTE — Assessment & Plan Note (Signed)
Controlled, no change in medication Hyperlipidemia:Low fat diet discussed and encouraged.   Lipid Panel  Lab Results  Component Value Date   CHOL 143 12/07/2014   HDL 58 12/07/2014   LDLCALC 72 12/07/2014   TRIG 63 12/07/2014   CHOLHDL 2.5 12/07/2014

## 2014-12-13 NOTE — Assessment & Plan Note (Signed)
Controlled, no change in medication  

## 2014-12-13 NOTE — Assessment & Plan Note (Signed)
Followed in coumadi clinic, denies epistaxis, bruising hematuria or rectal bleeding

## 2014-12-13 NOTE — Assessment & Plan Note (Signed)
Has had hip fracture, treated with Prolia IM  Through heme/onc clinic

## 2014-12-13 NOTE — Assessment & Plan Note (Signed)
Unchanged. Patient re-educated about  the importance of commitment to a  minimum of 150 minutes of exercise per week.  The importance of healthy food choices with portion control discussed. Encouraged to start a food diary, count calories and to consider  joining a support group. Sample diet sheets offered. Goals set by the patient for the next several months.   Weight /BMI 12/07/2014 08/24/2014 07/23/2014  WEIGHT 188 lb 1.9 oz 190 lb 3.2 oz 191 lb  HEIGHT 5\' 5"  - -  BMI 31.3 kg/m2 31.65 kg/m2 31.78 kg/m2    Current exercise per week 90 minutes.

## 2014-12-21 ENCOUNTER — Other Ambulatory Visit (HOSPITAL_COMMUNITY): Payer: Self-pay

## 2014-12-21 DIAGNOSIS — M8580 Other specified disorders of bone density and structure, unspecified site: Secondary | ICD-10-CM

## 2014-12-28 ENCOUNTER — Other Ambulatory Visit (HOSPITAL_COMMUNITY): Payer: Self-pay

## 2014-12-28 ENCOUNTER — Encounter (HOSPITAL_COMMUNITY): Payer: Commercial Managed Care - HMO | Attending: Hematology & Oncology

## 2014-12-28 DIAGNOSIS — I82409 Acute embolism and thrombosis of unspecified deep veins of unspecified lower extremity: Secondary | ICD-10-CM | POA: Diagnosis not present

## 2014-12-28 DIAGNOSIS — C50912 Malignant neoplasm of unspecified site of left female breast: Secondary | ICD-10-CM

## 2014-12-28 DIAGNOSIS — D6859 Other primary thrombophilia: Secondary | ICD-10-CM | POA: Diagnosis not present

## 2014-12-28 DIAGNOSIS — Z7901 Long term (current) use of anticoagulants: Secondary | ICD-10-CM

## 2014-12-28 LAB — PROTIME-INR
INR: 2.54 — ABNORMAL HIGH (ref 0.00–1.49)
PROTHROMBIN TIME: 27 s — AB (ref 11.6–15.2)

## 2014-12-28 NOTE — Progress Notes (Signed)
LABS DRAWN

## 2015-01-14 ENCOUNTER — Other Ambulatory Visit: Payer: Self-pay | Admitting: Family Medicine

## 2015-01-17 ENCOUNTER — Telehealth: Payer: Self-pay

## 2015-01-17 MED ORDER — LOVASTATIN 20 MG PO TABS: 20.0000 mg | ORAL_TABLET | Freq: Every day | ORAL | Status: DC

## 2015-01-17 NOTE — Telephone Encounter (Signed)
Patients med sent in to [facility]

## 2015-01-25 ENCOUNTER — Telehealth (HOSPITAL_COMMUNITY): Payer: Self-pay

## 2015-01-25 ENCOUNTER — Encounter (HOSPITAL_COMMUNITY): Payer: Commercial Managed Care - HMO | Attending: Hematology & Oncology

## 2015-01-25 DIAGNOSIS — I82409 Acute embolism and thrombosis of unspecified deep veins of unspecified lower extremity: Secondary | ICD-10-CM | POA: Diagnosis not present

## 2015-01-25 DIAGNOSIS — C50912 Malignant neoplasm of unspecified site of left female breast: Secondary | ICD-10-CM | POA: Diagnosis not present

## 2015-01-25 DIAGNOSIS — D6859 Other primary thrombophilia: Secondary | ICD-10-CM | POA: Insufficient documentation

## 2015-01-25 DIAGNOSIS — Z7901 Long term (current) use of anticoagulants: Secondary | ICD-10-CM | POA: Diagnosis not present

## 2015-01-25 LAB — PROTIME-INR
INR: 2.31 — ABNORMAL HIGH (ref 0.00–1.49)
Prothrombin Time: 25.2 seconds — ABNORMAL HIGH (ref 11.6–15.2)

## 2015-01-25 NOTE — Telephone Encounter (Signed)
Patient  instructed to continue same coumadin dosage 7.5mg  alternating with 5mg  and to return for next PT/INR on 02/22/15.  Verbalizes understanding.

## 2015-01-25 NOTE — Progress Notes (Signed)
Labs drawn

## 2015-02-22 ENCOUNTER — Encounter (HOSPITAL_COMMUNITY): Payer: Self-pay | Admitting: Hematology & Oncology

## 2015-02-22 ENCOUNTER — Encounter (HOSPITAL_COMMUNITY): Payer: Commercial Managed Care - HMO | Attending: Hematology & Oncology | Admitting: Hematology & Oncology

## 2015-02-22 ENCOUNTER — Encounter (HOSPITAL_BASED_OUTPATIENT_CLINIC_OR_DEPARTMENT_OTHER): Payer: Commercial Managed Care - HMO

## 2015-02-22 ENCOUNTER — Telehealth (HOSPITAL_COMMUNITY): Payer: Self-pay | Admitting: Emergency Medicine

## 2015-02-22 ENCOUNTER — Other Ambulatory Visit (HOSPITAL_COMMUNITY): Payer: Self-pay | Admitting: Oncology

## 2015-02-22 VITALS — BP 154/59 | HR 67 | Temp 98.3°F | Resp 18 | Wt 190.7 lb

## 2015-02-22 DIAGNOSIS — C50912 Malignant neoplasm of unspecified site of left female breast: Secondary | ICD-10-CM

## 2015-02-22 DIAGNOSIS — D6859 Other primary thrombophilia: Secondary | ICD-10-CM | POA: Insufficient documentation

## 2015-02-22 DIAGNOSIS — I82409 Acute embolism and thrombosis of unspecified deep veins of unspecified lower extremity: Secondary | ICD-10-CM | POA: Insufficient documentation

## 2015-02-22 DIAGNOSIS — M8580 Other specified disorders of bone density and structure, unspecified site: Secondary | ICD-10-CM

## 2015-02-22 DIAGNOSIS — Z86718 Personal history of other venous thrombosis and embolism: Secondary | ICD-10-CM

## 2015-02-22 DIAGNOSIS — B379 Candidiasis, unspecified: Secondary | ICD-10-CM

## 2015-02-22 DIAGNOSIS — Z170 Estrogen receptor positive status [ER+]: Secondary | ICD-10-CM | POA: Diagnosis not present

## 2015-02-22 DIAGNOSIS — Z7901 Long term (current) use of anticoagulants: Secondary | ICD-10-CM

## 2015-02-22 LAB — COMPREHENSIVE METABOLIC PANEL
ALT: 23 U/L (ref 14–54)
ANION GAP: 7 (ref 5–15)
AST: 30 U/L (ref 15–41)
Albumin: 3.7 g/dL (ref 3.5–5.0)
Alkaline Phosphatase: 36 U/L — ABNORMAL LOW (ref 38–126)
BUN: 16 mg/dL (ref 6–20)
CALCIUM: 9 mg/dL (ref 8.9–10.3)
CHLORIDE: 105 mmol/L (ref 101–111)
CO2: 27 mmol/L (ref 22–32)
Creatinine, Ser: 0.79 mg/dL (ref 0.44–1.00)
GFR calc Af Amer: >60 mL/min (ref >60)
Glucose, Bld: 91 mg/dL (ref 65–99)
POTASSIUM: 3.7 mmol/L (ref 3.5–5.1)
Sodium: 139 mmol/L (ref 135–145)
Total Bilirubin: 0.7 mg/dL (ref 0.3–1.2)
Total Protein: 6.8 g/dL (ref 6.5–8.1)

## 2015-02-22 LAB — PROTIME-INR
INR: 2.29 — ABNORMAL HIGH (ref 0.00–1.49)
Prothrombin Time: 25 seconds — ABNORMAL HIGH (ref 11.6–15.2)

## 2015-02-22 MED ORDER — DENOSUMAB 60 MG/ML ~~LOC~~ SOLN
60.0000 mg | Freq: Once | SUBCUTANEOUS | Status: AC
  Administered 2015-02-22: 60 mg via SUBCUTANEOUS
  Filled 2015-02-22: qty 1

## 2015-02-22 MED ORDER — ANASTROZOLE 1 MG PO TABS: 1.0000 mg | ORAL_TABLET | Freq: Every day | ORAL | Status: DC

## 2015-02-22 NOTE — Patient Instructions (Signed)
[place] at [facility] Discharge Instructions  RECOMMENDATIONS MADE BY THE CONSULTANT AND ANY TEST RESULTS WILL BE SENT TO YOUR REFERRING PHYSICIAN.  Exam completed by Dr Whitney Muse today Prolia today Prolia every 6 months INR's monthly Mammogram scheduled Return to see the doctor in 6 months Please call the clinic if you have any questions or concerns   Thank you for choosing [place] at [facility] to provide your oncology and hematology care.  To afford each patient quality time with our provider, please arrive at least 15 minutes before your scheduled appointment time.    You need to re-schedule your appointment should you arrive 10 or more minutes late.  We strive to give you quality time with our providers, and arriving late affects you and other patients whose appointments are after yours.  Also, if you no show three or more times for appointments you may be dismissed from the clinic at the providers discretion.     Again, thank you for choosing [facility].  Our hope is that these requests will decrease the amount of time that you wait before being seen by our physicians.       _____________________________________________________________  Should you have questions after your visit to [facility], please contact our office at [phone] between the hours of 8:30 a.m. and 4:30 p.m.  Voicemails left after 4:30 p.m. will not be returned until the following business day.  For prescription refill requests, have your pharmacy contact our office.

## 2015-02-22 NOTE — Telephone Encounter (Signed)
-----   Message from Baird Cancer, PA-C sent at 02/22/2015  3:29 PM EDT ----- PERFECT!  Same dose of Coumadin.  INR in 4 weeks

## 2015-02-22 NOTE — Progress Notes (Signed)
Lisa Nakayama, MD [address] [place] [address]  L breast cancer, s/p L modified radical mastectomy. 1.5cm tumor, 6/7 positive nodes. Stage IIIA ER+/PR+/Her-2 neu negative 6 cycles of Epirubicin/cytoxan, multiple dose reductions Adjuvant XRT Arimidex 1 mg daily 10/2011 2 prior DVT's, on Coumadin  Cholesterol checked 02/2014  05/05/2013 Bone Density with Osteopenia, on fosamax  CURRENT THERAPY: Arimidex  INTERVAL HISTORY: Lisa Klein 81 y.o. female returns for follow-up of her breast cancer. She continues on Arimidex. She has osteopenia. She has had prolia with excellent tolerance. She takes calcium and vitamin D.  She has discontinued her Xarelto because of cost.  She is back on coumadin.  Dr. Moshe Cipro follows this.   She has no complaints. She is overall doing well. She is up to date on her mammogram. The patient says that she has been feeling normal.  She does work at home and goes grocery shopping occasionally.  She states that she can sleep better if she goes to bed around 12 am, waking up around 5-6 am.  She takes a nap throughout the day.  She denies any problems with her breast or mastectomy site. MEDICAL HISTORY: Past Medical History  Diagnosis Date  . DJD (degenerative joint disease)   . Hypertension   . Hypothyroidism   . Obesity   . Degenerative disc disease     with nerve compression   . Hypothyroidism   . Hypertension   . Clotting disorder 05/2011    dvt, right leg  . Cancer 2012    LEFT BREAST    has Hypothyroidism; Hyperlipemia; Essential hypertension; Infiltrating ductal carcinoma of breast; Osteopenia; Long term current use of anticoagulant therapy; and Obesity on her problem list.     No history exists.     is allergic to listerine.  Ms. Kowalczyk had no medications administered during this visit.  SURGICAL HISTORY: Past Surgical History  Procedure Laterality Date  . Vesicovaginal fistula closure w/ tah      [facility]    . Cholecystectomy  80'S    [facility]  . Mastectomy modified radical  02/14/11    left  . Portacath placement  04/16/2011    Procedure: INSERTION PORT-A-CATH;  Surgeon: Jamesetta So;  Location: [facility];  Service: General;  Laterality: Right;  right subclavian  . Cholecystectomy    . Breast surgery      left total mastectomy  . Port-a-cath removal N/A 12/05/2012    Procedure: MINOR REMOVAL PORT-A-CATH;  Surgeon: Jamesetta So, MD;  Location: [facility];  Service: General;  Laterality: N/A;  In Minor Room  . Hip pinning,cannulated Left 01/19/2013    Procedure: CANNULATED HIP PINNING-  left;  Surgeon: Marin Shutter, MD;  Location: [facility];  Service: Orthopedics;  Laterality: Left;    SOCIAL HISTORY: History   Social History  . Marital Status: Widowed    Spouse Name: N/A  . Number of Children: 2  . Years of Education: N/A   Occupational History  . [employer] part time     Social History Main Topics  . Smoking status: Never Smoker   . Smokeless tobacco: Never Used  . Alcohol Use: No  . Drug Use: No  . Sexual Activity: No   Other Topics Concern  . Not on file   Social History Narrative    FAMILY HISTORY: Family History  Problem Relation Age of Onset  . COPD Father   . Lung disease Father   . Hypertension  Sister   . Anesthesia problems Neg Hx   . Malignant hyperthermia Neg Hx   . Hypotension Neg Hx   . Pseudochol deficiency Neg Hx     Review of Systems  Constitutional: Negative.   HENT: Negative.   Eyes: Negative.   Musculoskeletal:       Walks with a  cane  Skin: Negative.   Endo/Heme/Allergies: Negative.   Psychiatric/Behavioral: Negative for depression, suicidal ideas, hallucinations, memory loss and substance abuse. The patient is not nervous/anxious and does not have insomnia.   14 point review of systems was performed and is negative except as detailed under history of present illness and above   PHYSICAL EXAMINATION  ECOG PERFORMANCE STATUS: 1 - Symptomatic but  completely ambulatory  Filed Vitals:   02/22/15 1100  BP: 154/59  Pulse: 67  Temp: 98.3 F (36.8 C)  Resp: 18    Physical Exam  Constitutional: She is oriented to person, place, and time and well-developed, well-nourished, and in no distress.  HENT:  Head: Normocephalic and atraumatic.  Nose: Nose normal.  Mouth/Throat: Oropharynx is clear and moist. No oropharyngeal exudate.  Eyes: Conjunctivae and EOM are normal. Pupils are equal, round, and reactive to light. Right eye exhibits no discharge. Left eye exhibits no discharge. No scleral icterus.  Neck: Normal range of motion. Neck supple. No tracheal deviation present. No thyromegaly present.  Cardiovascular: Normal rate, regular rhythm and normal heart sounds.  Exam reveals no gallop and no friction rub.   No murmur heard. Pulmonary/Chest: Effort normal and breath sounds normal. She has no wheezes. She has no rales.  Breasts: L mastectomy site, normal without palpable abnormality Abdominal: Soft. Bowel sounds are normal. She exhibits no distension and no mass. There is no tenderness. There is no rebound and no guarding.  Musculoskeletal: Normal range of motion. She exhibits no edema.  Lymphadenopathy:    She has no cervical adenopathy.  Neurological: She is alert and oriented to person, place, and time. She has normal reflexes. No cranial nerve deficit. Gait normal. Coordination normal.  Extremities: Bilateral knee effusion Skin: Skin is warm and dry. No rash noted.  Psychiatric: Mood, memory, affect and judgment normal.  Nursing note and vitals reviewed.   LABORATORY DATA:  CBC    Component Value Date/Time   WBC 3.4* 07/21/2014 0958   RBC 4.26 07/21/2014 0958   HGB 12.1 07/21/2014 0958   HCT 38.1 07/21/2014 0958   PLT 168 07/21/2014 0958   MCV 89.4 07/21/2014 0958   MCH 28.4 07/21/2014 0958   MCHC 31.8 07/21/2014 0958   RDW 15.1 07/21/2014 0958   LYMPHSABS 1.4 07/21/2014 0958   MONOABS 0.5 07/21/2014 0958   EOSABS  0.1 07/21/2014 0958   BASOSABS 0.1 07/21/2014 0958   CMP     Component Value Date/Time   NA 139 02/22/2015 1102   K 3.7 02/22/2015 1102   CL 105 02/22/2015 1102   CO2 27 02/22/2015 1102   GLUCOSE 91 02/22/2015 1102   BUN 16 02/22/2015 1102   CREATININE 0.79 02/22/2015 1102   CREATININE 0.83 12/07/2014 1118   CALCIUM 9.0 02/22/2015 1102   PROT 6.8 02/22/2015 1102   ALBUMIN 3.7 02/22/2015 1102   AST 30 02/22/2015 1102   ALT 23 02/22/2015 1102   ALKPHOS 36* 02/22/2015 1102   BILITOT 0.7 02/22/2015 1102   GFRNONAA >60 02/22/2015 1102   GFRNONAA 83 09/26/2011 0955   GFRAA >60 02/22/2015 1102   GFRAA >89 09/26/2011 0955     ASSESSMENT  and THERAPY PLAN:  L breast cancer, s/p L modified radical mastectomy. 1.5cm tumor, 6/7 positive nodes. Stage IIIA ER+/PR+/Her-2 neu negative 6 cycles of Epirubicin/cytoxan, multiple dose reductions Adjuvant XRT Arimidex 1 mg daily 10/2011 2 prior DVT's, on Coumadin Osteopenia  We have ordered her screening mammogram for December. She has complaints of a rash under the right breast, on exam is consistent with yeast. I have called her in nystatin powder to [employer]. I advised her that this does not improve in the next 2 weeks to let us know. She is otherwise doing well without obvious evidence of recurrence.  She is to continue on her calcium and vitamin D and prolia. Again reminded the patient about notifying her dentist prior to any dental work that she is on Prolia.  All questions were answered. The patient knows to call the clinic with any problems, questions or concerns. We can certainly see the patient much sooner if necessary.   This document serves as a record of services personally performed by Ancil Linsey, MD. It was created on her behalf by Janace Hoard, a trained medical scribe. The creation of this record is based on the scribe's personal observations and the provider's statements to them. This document has been checked  and approved by the attending provider.  I have reviewed the above documentation for accuracy and completeness, and I agree with the above.  This note is electronically signed.  Kelby Fam. Whitney Muse, MD

## 2015-02-22 NOTE — Telephone Encounter (Signed)
Notified pt of PT/INR level, stay on same dose of coumadin, return 4 weeks to have labs checked, verbalized understanding

## 2015-02-22 NOTE — Progress Notes (Signed)
Cera G Mccutchan presents today for injection per MD orders. Prolia 60mg administered SQ in right Abdomen. Administration without incident. Patient tolerated well.  

## 2015-02-23 NOTE — Progress Notes (Signed)
Labs drawn

## 2015-02-24 ENCOUNTER — Other Ambulatory Visit (HOSPITAL_COMMUNITY): Payer: Self-pay | Admitting: Hematology & Oncology

## 2015-02-24 MED ORDER — NYSTATIN 100000 UNIT/GM EX POWD: CUTANEOUS | Status: DC

## 2015-03-25 ENCOUNTER — Telehealth (HOSPITAL_COMMUNITY): Payer: Self-pay

## 2015-03-25 ENCOUNTER — Encounter (HOSPITAL_COMMUNITY): Payer: Commercial Managed Care - HMO | Attending: Hematology & Oncology

## 2015-03-25 DIAGNOSIS — Z7901 Long term (current) use of anticoagulants: Secondary | ICD-10-CM

## 2015-03-25 DIAGNOSIS — I82409 Acute embolism and thrombosis of unspecified deep veins of unspecified lower extremity: Secondary | ICD-10-CM | POA: Diagnosis not present

## 2015-03-25 DIAGNOSIS — D6859 Other primary thrombophilia: Secondary | ICD-10-CM | POA: Insufficient documentation

## 2015-03-25 DIAGNOSIS — C50912 Malignant neoplasm of unspecified site of left female breast: Secondary | ICD-10-CM | POA: Insufficient documentation

## 2015-03-25 LAB — PROTIME-INR
INR: 2.38 — AB (ref 0.00–1.49)
PROTHROMBIN TIME: 25.7 s — AB (ref 11.6–15.2)

## 2015-03-25 NOTE — Telephone Encounter (Signed)
Patient instructed to continue same dosage of coumadin 7.5mg  alternating with 5mg   and to return for next PT/INR in 1 month.  Verbalizes understanding.

## 2015-03-28 NOTE — Progress Notes (Signed)
Labs drawn

## 2015-04-18 ENCOUNTER — Other Ambulatory Visit: Payer: Self-pay | Admitting: Family Medicine

## 2015-04-22 ENCOUNTER — Encounter (HOSPITAL_COMMUNITY): Payer: Commercial Managed Care - HMO | Attending: Hematology & Oncology

## 2015-04-22 ENCOUNTER — Other Ambulatory Visit (HOSPITAL_COMMUNITY): Payer: Self-pay | Admitting: Oncology

## 2015-04-22 DIAGNOSIS — Z7901 Long term (current) use of anticoagulants: Secondary | ICD-10-CM | POA: Insufficient documentation

## 2015-04-22 LAB — PROTIME-INR
INR: 1.9 — ABNORMAL HIGH (ref 0.00–1.49)
PROTHROMBIN TIME: 21.7 s — AB (ref 11.6–15.2)

## 2015-04-22 NOTE — Progress Notes (Signed)
Patient notified to increase to 7.5 mg x 2 days then return to alternating 5 mg/7.5 mg and recheck in 10 days. She reports that the only change was that she ate spinach- small servings over several meals.

## 2015-05-02 ENCOUNTER — Encounter: Payer: Self-pay | Admitting: Family Medicine

## 2015-05-02 ENCOUNTER — Ambulatory Visit (INDEPENDENT_AMBULATORY_CARE_PROVIDER_SITE_OTHER): Payer: Commercial Managed Care - HMO | Admitting: Family Medicine

## 2015-05-02 ENCOUNTER — Encounter (HOSPITAL_COMMUNITY): Payer: Commercial Managed Care - HMO

## 2015-05-02 ENCOUNTER — Other Ambulatory Visit (HOSPITAL_COMMUNITY): Payer: Self-pay

## 2015-05-02 VITALS — BP 130/78 | HR 72 | Resp 18 | Ht 65.0 in | Wt 192.0 lb

## 2015-05-02 DIAGNOSIS — Z7901 Long term (current) use of anticoagulants: Secondary | ICD-10-CM | POA: Diagnosis not present

## 2015-05-02 DIAGNOSIS — Z0000 Encounter for general adult medical examination without abnormal findings: Secondary | ICD-10-CM | POA: Diagnosis not present

## 2015-05-02 DIAGNOSIS — I10 Essential (primary) hypertension: Secondary | ICD-10-CM

## 2015-05-02 DIAGNOSIS — Z23 Encounter for immunization: Secondary | ICD-10-CM

## 2015-05-02 DIAGNOSIS — E785 Hyperlipidemia, unspecified: Secondary | ICD-10-CM

## 2015-05-02 DIAGNOSIS — E039 Hypothyroidism, unspecified: Secondary | ICD-10-CM

## 2015-05-02 LAB — PROTIME-INR
INR: 2.89 — ABNORMAL HIGH (ref 0.00–1.49)
Prothrombin Time: 29.8 seconds — ABNORMAL HIGH (ref 11.6–15.2)

## 2015-05-02 NOTE — Progress Notes (Signed)
Lisa Klein presented for labwork. Labs per MD order drawn via Peripheral Line 23 gauge needle inserted in right AC  Good blood return present. Procedure without incident.  Needle removed intact. Patient tolerated procedure well.  Patient notified to continue same dosage of coumadin 7.5mg  alternating with 5mg  and to return for next INR in 1 month.

## 2015-05-02 NOTE — Assessment & Plan Note (Signed)

## 2015-05-02 NOTE — Patient Instructions (Signed)
Annual physical in mid to end January, call if you need me sooner  Flu vaccine today  Keep brain active , so memory keeps good, read, puzzles not only in Winter  Work on advanced directives with your children   Fasting lipid, cmp, TSH, CBC, in December  Thanks for choosing [facility], we consider it a privelige to serve you.

## 2015-05-02 NOTE — Progress Notes (Signed)
Subjective:    Patient ID: Lisa Klein, female    DOB: 02-22-1930, 81 y.o.   MRN: [id number]  HPI Preventive Screening-Counseling & Management   Patient present here today for a Medicare annual wellness visit.   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 No  Over the past two weeks, have you felt little interest or pleasure in doing things? No  Have you lost interest or pleasure in daily life? No  Do you often feel hopeless? No  Do you cry easily over simple problems? No   Activities of Daily Living  In your present state of health, do you have any difficulty performing the following activities?  Driving?: No Managing money?: No Feeding yourself?:No Getting from bed to chair?:No Climbing a flight of stairs?: Yes due to mobility Preparing food and eating?:No Bathing or showering?:No Getting dressed?:No Getting to the toilet?:No Using the toilet?:No Moving around from place to place?: Yes due to mobility, relies on cane for safe ambulation  Fall Risk Assessment In the past year have you fallen or had a near fall?:No Are you currently taking any medications that make you dizzy?:No   Hearing Difficulties: No Do you often ask people to speak up or repeat themselves?:No Do you experience ringing or noises in your ears?:No Do you have difficulty understanding soft or whispered voices?:No  Cognitive Testing  Alert? Yes Normal  Appearance?Yes  Oriented to person? Yes Place? Yes  Time? Yes  Displays appropriate judgment?Yes  Can read the correct time from a watch face? yes Are you having problems remembering things?No age appropriate  Advanced Directives have been discussed with the patient?Yes and brochure provided , full code   List the Names of Other Physician/Practitioners you currently use: careteams updated    Indicate any recent Medical Services you may have received from other than [facility] providers in the past year (date may be approximate).   Assessment:    Annual Wellness Exam   Plan:    Patient Instructions (the written plan) was given to the patient.  Medicare Attestation  I have personally reviewed:  The patient's medical and social history  Their use of alcohol, tobacco or illicit drugs  Their current medications and supplements  The patient's functional ability including ADLs,fall risks, home safety risks, cognitive, and hearing and visual impairment  Diet and physical activities  Evidence for depression or mood disorders  The patient's weight, height, BMI, and visual acuity have been recorded in the chart. I have made referrals, counseling, and provided education to the patient based on review of the above and I have provided the patient with a written personalized care plan for preventive services.     Review of Systems     Objective:   Physical Exam  BP 130/78 mmHg  Pulse 72  Resp 18  Ht 5\' 5"  (1.651 m)  Wt 192 lb (87.091 kg)  BMI 31.95 kg/m2  SpO2 97%  Assessment & Plan:  Medicare annual wellness visit, subsequent Annual exam as documented. Counseling done  re healthy lifestyle involving commitment to 150 minutes exercise per week, heart healthy diet, and attaining healthy weight.The importance of adequate sleep also discussed. Regular seat belt use and home safety, is also discussed. Changes in health habits are decided on by the patient with goals and time  frames  set for achieving them. Immunization and cancer screening needs are specifically addressed at this visit.   Need for prophylactic vaccination and inoculation against influenza After obtaining informed consent, the vaccine is  administered by LPN.

## 2015-05-02 NOTE — Assessment & Plan Note (Signed)
After obtaining informed consent, the vaccine is  administered by LPN.  

## 2015-05-25 ENCOUNTER — Other Ambulatory Visit: Payer: Self-pay | Admitting: Family Medicine

## 2015-05-25 DIAGNOSIS — E039 Hypothyroidism, unspecified: Secondary | ICD-10-CM | POA: Diagnosis not present

## 2015-05-25 DIAGNOSIS — I10 Essential (primary) hypertension: Secondary | ICD-10-CM | POA: Diagnosis not present

## 2015-05-25 DIAGNOSIS — E785 Hyperlipidemia, unspecified: Secondary | ICD-10-CM | POA: Diagnosis not present

## 2015-05-26 LAB — T4, FREE: Free T4: 0.96 ng/dL (ref 0.80–1.80)

## 2015-05-26 LAB — COMPREHENSIVE METABOLIC PANEL
ALBUMIN: 4 g/dL (ref 3.6–5.1)
ALT: 20 U/L (ref 6–29)
AST: 23 U/L (ref 10–35)
Alkaline Phosphatase: 35 U/L (ref 33–130)
BUN: 14 mg/dL (ref 7–25)
CHLORIDE: 105 mmol/L (ref 98–110)
CO2: 29 mmol/L (ref 20–31)
CREATININE: 0.81 mg/dL (ref 0.60–0.88)
Calcium: 8.9 mg/dL (ref 8.6–10.4)
Glucose, Bld: 88 mg/dL (ref 65–99)
POTASSIUM: 4.1 mmol/L (ref 3.5–5.3)
SODIUM: 141 mmol/L (ref 135–146)
Total Bilirubin: 0.6 mg/dL (ref 0.2–1.2)
Total Protein: 6.9 g/dL (ref 6.1–8.1)

## 2015-05-26 LAB — CBC
HEMATOCRIT: 39.3 % (ref 36.0–46.0)
HEMOGLOBIN: 12.5 g/dL (ref 12.0–15.0)
MCH: 27.9 pg (ref 26.0–34.0)
MCHC: 31.8 g/dL (ref 30.0–36.0)
MCV: 87.7 fL (ref 78.0–100.0)
MPV: 10.8 fL (ref 8.6–12.4)
Platelets: 187 10*3/uL (ref 150–400)
RBC: 4.48 MIL/uL (ref 3.87–5.11)
RDW: 16 % — AB (ref 11.5–15.5)
WBC: 4.1 10*3/uL (ref 4.0–10.5)

## 2015-05-26 LAB — LIPID PANEL
CHOL/HDL RATIO: 2.5 ratio (ref <=5.0)
Cholesterol: 151 mg/dL (ref 125–200)
HDL: 60 mg/dL (ref >=46)
LDL Cholesterol: 77 mg/dL (ref <130)
Triglycerides: 71 mg/dL (ref <150)
VLDL: 14 mg/dL (ref <30)

## 2015-05-26 LAB — T3, FREE: T3 FREE: 2.4 pg/mL (ref 2.3–4.2)

## 2015-05-26 LAB — TSH: TSH: 4.728 u[IU]/mL — ABNORMAL HIGH (ref 0.350–4.500)

## 2015-05-27 ENCOUNTER — Encounter (HOSPITAL_COMMUNITY): Payer: Commercial Managed Care - HMO | Attending: Oncology

## 2015-05-27 DIAGNOSIS — Z7901 Long term (current) use of anticoagulants: Secondary | ICD-10-CM | POA: Insufficient documentation

## 2015-05-27 DIAGNOSIS — C50912 Malignant neoplasm of unspecified site of left female breast: Secondary | ICD-10-CM | POA: Diagnosis not present

## 2015-05-27 LAB — PROTIME-INR
INR: 2.18 — AB (ref 0.00–1.49)
PROTHROMBIN TIME: 24 s — AB (ref 11.6–15.2)

## 2015-05-27 NOTE — Progress Notes (Signed)
Labs drawn

## 2015-06-20 ENCOUNTER — Other Ambulatory Visit: Payer: Self-pay | Admitting: Family Medicine

## 2015-06-24 ENCOUNTER — Encounter (HOSPITAL_COMMUNITY): Payer: Commercial Managed Care - HMO | Attending: Hematology & Oncology

## 2015-06-24 DIAGNOSIS — Z7901 Long term (current) use of anticoagulants: Secondary | ICD-10-CM | POA: Diagnosis not present

## 2015-06-24 LAB — PROTIME-INR
INR: 2.63 — ABNORMAL HIGH (ref 0.00–1.49)
Prothrombin Time: 27.7 seconds — ABNORMAL HIGH (ref 11.6–15.2)

## 2015-07-11 ENCOUNTER — Ambulatory Visit (HOSPITAL_COMMUNITY)
Admission: RE | Admit: 2015-07-11 | Discharge: 2015-07-11 | Disposition: A | Discharge status: Home / Self Care | Payer: Commercial Managed Care - HMO | Source: Ambulatory Visit | Attending: Family Medicine | Admitting: Family Medicine

## 2015-07-11 ENCOUNTER — Ambulatory Visit (HOSPITAL_COMMUNITY): Payer: Commercial Managed Care - HMO

## 2015-07-11 ENCOUNTER — Other Ambulatory Visit: Payer: Self-pay | Admitting: Family Medicine

## 2015-07-11 DIAGNOSIS — Z9012 Acquired absence of left breast and nipple: Secondary | ICD-10-CM | POA: Diagnosis not present

## 2015-07-11 DIAGNOSIS — Z9221 Personal history of antineoplastic chemotherapy: Secondary | ICD-10-CM | POA: Insufficient documentation

## 2015-07-11 DIAGNOSIS — Z1231 Encounter for screening mammogram for malignant neoplasm of breast: Secondary | ICD-10-CM | POA: Insufficient documentation

## 2015-07-11 DIAGNOSIS — Z853 Personal history of malignant neoplasm of breast: Secondary | ICD-10-CM | POA: Diagnosis not present

## 2015-07-11 DIAGNOSIS — Z923 Personal history of irradiation: Secondary | ICD-10-CM | POA: Diagnosis not present

## 2015-07-22 ENCOUNTER — Encounter (HOSPITAL_COMMUNITY): Payer: Commercial Managed Care - HMO | Attending: Hematology & Oncology

## 2015-07-22 DIAGNOSIS — I82409 Acute embolism and thrombosis of unspecified deep veins of unspecified lower extremity: Secondary | ICD-10-CM | POA: Diagnosis not present

## 2015-07-22 DIAGNOSIS — D6859 Other primary thrombophilia: Secondary | ICD-10-CM | POA: Diagnosis not present

## 2015-07-22 DIAGNOSIS — Z7901 Long term (current) use of anticoagulants: Secondary | ICD-10-CM

## 2015-07-22 DIAGNOSIS — C50912 Malignant neoplasm of unspecified site of left female breast: Secondary | ICD-10-CM | POA: Insufficient documentation

## 2015-07-22 LAB — PROTIME-INR
INR: 2.53 — AB (ref 0.00–1.49)
PROTHROMBIN TIME: 26.9 s — AB (ref 11.6–15.2)

## 2015-08-25 ENCOUNTER — Encounter (HOSPITAL_COMMUNITY): Payer: Self-pay | Admitting: Hematology & Oncology

## 2015-08-25 ENCOUNTER — Encounter (HOSPITAL_BASED_OUTPATIENT_CLINIC_OR_DEPARTMENT_OTHER): Payer: Commercial Managed Care - HMO

## 2015-08-25 ENCOUNTER — Other Ambulatory Visit (HOSPITAL_COMMUNITY): Payer: Self-pay | Admitting: Hematology & Oncology

## 2015-08-25 ENCOUNTER — Encounter (HOSPITAL_COMMUNITY): Payer: Commercial Managed Care - HMO | Attending: Hematology & Oncology | Admitting: Hematology & Oncology

## 2015-08-25 VITALS — BP 160/72 | HR 69 | Temp 97.9°F | Resp 20 | Wt 193.2 lb

## 2015-08-25 DIAGNOSIS — D6859 Other primary thrombophilia: Secondary | ICD-10-CM | POA: Diagnosis not present

## 2015-08-25 DIAGNOSIS — R229 Localized swelling, mass and lump, unspecified: Principal | ICD-10-CM

## 2015-08-25 DIAGNOSIS — M8580 Other specified disorders of bone density and structure, unspecified site: Secondary | ICD-10-CM

## 2015-08-25 DIAGNOSIS — IMO0002 Reserved for concepts with insufficient information to code with codable children: Secondary | ICD-10-CM

## 2015-08-25 DIAGNOSIS — Z7901 Long term (current) use of anticoagulants: Secondary | ICD-10-CM

## 2015-08-25 DIAGNOSIS — Z79899 Other long term (current) drug therapy: Secondary | ICD-10-CM

## 2015-08-25 DIAGNOSIS — Z1231 Encounter for screening mammogram for malignant neoplasm of breast: Secondary | ICD-10-CM

## 2015-08-25 DIAGNOSIS — C50912 Malignant neoplasm of unspecified site of left female breast: Secondary | ICD-10-CM | POA: Diagnosis not present

## 2015-08-25 DIAGNOSIS — Z86718 Personal history of other venous thrombosis and embolism: Secondary | ICD-10-CM

## 2015-08-25 DIAGNOSIS — I82409 Acute embolism and thrombosis of unspecified deep veins of unspecified lower extremity: Secondary | ICD-10-CM | POA: Diagnosis not present

## 2015-08-25 LAB — COMPREHENSIVE METABOLIC PANEL
ALT: 22 U/L (ref 14–54)
ANION GAP: 8 (ref 5–15)
AST: 28 U/L (ref 15–41)
Albumin: 3.9 g/dL (ref 3.5–5.0)
Alkaline Phosphatase: 42 U/L (ref 38–126)
BILIRUBIN TOTAL: 0.6 mg/dL (ref 0.3–1.2)
BUN: 13 mg/dL (ref 6–20)
CHLORIDE: 106 mmol/L (ref 101–111)
CO2: 26 mmol/L (ref 22–32)
Calcium: 9.2 mg/dL (ref 8.9–10.3)
Creatinine, Ser: 0.8 mg/dL (ref 0.44–1.00)
Glucose, Bld: 94 mg/dL (ref 65–99)
POTASSIUM: 3.9 mmol/L (ref 3.5–5.1)
Sodium: 140 mmol/L (ref 135–145)
TOTAL PROTEIN: 7.1 g/dL (ref 6.5–8.1)

## 2015-08-25 LAB — PROTIME-INR
INR: 2.69 — AB (ref 0.00–1.49)
PROTHROMBIN TIME: 28.2 s — AB (ref 11.6–15.2)

## 2015-08-25 MED ORDER — ANASTROZOLE 1 MG PO TABS: 1.0000 mg | ORAL_TABLET | Freq: Every day | ORAL | Status: DC

## 2015-08-25 MED ORDER — DENOSUMAB 60 MG/ML ~~LOC~~ SOLN
60.0000 mg | Freq: Once | SUBCUTANEOUS | Status: AC
  Administered 2015-08-25: 60 mg via SUBCUTANEOUS
  Filled 2015-08-25: qty 1

## 2015-08-25 NOTE — Progress Notes (Signed)
Lisa Klein presents today for injection per MD orders. Prolia administered SQ in right Abdomen. Administration without incident. Patient tolerated well.  

## 2015-08-25 NOTE — Patient Instructions (Signed)
[place] at [facility] Discharge Instructions  RECOMMENDATIONS MADE BY THE CONSULTANT AND ANY TEST RESULTS WILL BE SENT TO YOUR REFERRING PHYSICIAN.   Exam and discussion by Dr Whitney Muse today Bone density ordered, you can go by and get this x-ray done Continue taking Calcium with Vitamin D Prolia today Prolia every 6 months Mammogram scheduled Continue taking arimidex as prescribed  Return to see the doctor in 6 months with lab work Please call the clinic if you have any questions or concerns    Thank you for choosing [place] at [facility] to provide your oncology and hematology care.  To afford each patient quality time with our provider, please arrive at least 15 minutes before your scheduled appointment time.   Beginning January 23rd 2017 lab work for the [facility] will be done in the  Main lab at [facility] on 1st floor. If you have a lab appointment with the [facility] please come in thru the  Main Entrance and check in at the main information desk  You need to re-schedule your appointment should you arrive 10 or more minutes late.  We strive to give you quality time with our providers, and arriving late affects you and other patients whose appointments are after yours.  Also, if you no show three or more times for appointments you may be dismissed from the clinic at the providers discretion.     Again, thank you for choosing [facility].  Our hope is that these requests will decrease the amount of time that you wait before being seen by our physicians.       _____________________________________________________________  Should you have questions after your visit to [facility], please contact our office at [phone] between the hours of 8:30 a.m. and 4:30 p.m.  Voicemails left after 4:30 p.m. will not be returned until the following business day.  For prescription refill requests, have your  pharmacy contact our office.

## 2015-08-25 NOTE — Patient Instructions (Signed)
[place] at [facility] Discharge Instructions  RECOMMENDATIONS MADE BY THE CONSULTANT AND ANY TEST RESULTS WILL BE SENT TO YOUR REFERRING PHYSICIAN.  Prolia today. Please see MD appointment AVS for additional follow up information.    Thank you for choosing [place] at [facility] to provide your oncology and hematology care.  To afford each patient quality time with our provider, please arrive at least 15 minutes before your scheduled appointment time.   Beginning January 23rd 2017 lab work for the [facility] will be done in the  Main lab at [facility] on 1st floor. If you have a lab appointment with the [facility] please come in thru the  Main Entrance and check in at the main information desk  You need to re-schedule your appointment should you arrive 10 or more minutes late.  We strive to give you quality time with our providers, and arriving late affects you and other patients whose appointments are after yours.  Also, if you no show three or more times for appointments you may be dismissed from the clinic at the providers discretion.     Again, thank you for choosing [facility].  Our hope is that these requests will decrease the amount of time that you wait before being seen by our physicians.       _____________________________________________________________  Should you have questions after your visit to [facility], please contact our office at [phone] between the hours of 8:30 a.m. and 4:30 p.m.  Voicemails left after 4:30 p.m. will not be returned until the following business day.  For prescription refill requests, have your pharmacy contact our office.

## 2015-08-25 NOTE — Progress Notes (Signed)
Lisa Nakayama, MD [address], [address] [place] [address]  L breast cancer, s/p L modified radical mastectomy. 1.5cm tumor, 6/7 positive nodes. Stage IIIA ER+/PR+/Her-2 neu negative 6 cycles of Epirubicin/cytoxan, multiple dose reductions Adjuvant XRT Arimidex 1 mg daily 10/2011 2 prior DVT's, on Coumadin  Cholesterol checked 02/2014  05/05/2013 Bone Density with Osteopenia, on fosamax  CURRENT THERAPY: Arimidex  INTERVAL HISTORY: Lisa Klein 82 y.o. female returns for follow-up of her breast cancer. She continues on Arimidex. She has osteopenia. She has had prolia with excellent tolerance. She takes calcium and vitamin D.  Lisa Klein is here alone today.   She is up to date on her right breast screening mammograms, last performed on 07/12/15. She is overdue on her bone density scans, last performed on 05/05/13.  Her only complaint is that she has been losing her hair. This started since the weather turned cold. She began oiling her head and changed shampoos which has helped some. When she goes to bed, her scalp itches so she figures that she scratches while sleeping. She keeps her home fairly warm.   Reports that she has cramps in her ribs sometimes when she bends. It began on her left side and now is more prevalent on her right side. These cramps do not occur every day. Denies falls.  Denies dysuria. Reports urinary urgency. She eats "too good". She has no other complaints at this time.   MEDICAL HISTORY: Past Medical History  Diagnosis Date  . DJD (degenerative joint disease)   . Hypertension   . Hypothyroidism   . Obesity   . Degenerative disc disease     with nerve compression   . Hypothyroidism   . Hypertension   . Clotting disorder ([place]) 05/2011    dvt, right leg  . Cancer ([place]) 2012    LEFT BREAST    has Hypothyroidism; Hyperlipemia; Essential hypertension; Infiltrating ductal carcinoma of breast ([place]); Osteopenia; Long term current use of  anticoagulant therapy; Obesity; Medicare annual wellness visit, subsequent; and Need for prophylactic vaccination and inoculation against influenza on her problem list.     No history exists.     is allergic to listerine.  Lisa Klein had no medications administered during this visit.  SURGICAL HISTORY: Past Surgical History  Procedure Laterality Date  . Vesicovaginal fistula closure w/ tah      [facility]  . Cholecystectomy  80'S    [facility]  . Mastectomy modified radical  02/14/11    left  . Portacath placement  04/16/2011    Procedure: INSERTION PORT-A-CATH;  Surgeon: Jamesetta So;  Location: [facility];  Service: General;  Laterality: Right;  right subclavian  . Cholecystectomy    . Port-a-cath removal N/A 12/05/2012    Procedure: MINOR REMOVAL PORT-A-CATH;  Surgeon: Jamesetta So, MD;  Location: [facility];  Service: General;  Laterality: N/A;  In Minor Room  . Hip pinning,cannulated Left 01/19/2013    Procedure: CANNULATED HIP PINNING-  left;  Surgeon: Marin Shutter, MD;  Location: [facility];  Service: Orthopedics;  Laterality: Left;  . Breast surgery  2012    left total mastectomy  . Abdominal hysterectomy  1980    fibroids    SOCIAL HISTORY: Social History   Social History  . Marital Status: Widowed    Spouse Name: N/A  . Number of Children: 2  . Years of Education: N/A   Occupational History  . [employer] part time  Social History Main Topics  . Smoking status: Never Smoker   . Smokeless tobacco: Never Used  . Alcohol Use: No  . Drug Use: No  . Sexual Activity: No   Other Topics Concern  . Not on file   Social History Narrative    FAMILY HISTORY: Family History  Problem Relation Age of Onset  . COPD Father   . Lung disease Father   . Hypertension Sister   . Anesthesia problems Neg Hx   . Malignant hyperthermia Neg Hx   . Hypotension Neg Hx   . Pseudochol deficiency Neg Hx     Review of Systems  Constitutional: Negative.   HENT: Positive for hair loss.   Eyes:  Negative.   Genitourinary: Positive for urgency. Denies dysuria Musculoskeletal:       Walks with a  cane  Skin: Negative.   Endo/Heme/Allergies: Negative.   Psychiatric/Behavioral: Negative for depression, suicidal ideas, hallucinations, memory loss and substance abuse. The patient is not nervous/anxious and does not have insomnia.   14 point review of systems was performed and is negative except as detailed under history of present illness and above   PHYSICAL EXAMINATION  ECOG PERFORMANCE STATUS: 1 - Symptomatic but completely ambulatory  Filed Vitals:   08/25/15 1215  BP: 160/72  Pulse: 69  Temp: 97.9 F (36.6 C)  Resp: 20    Physical Exam  Constitutional: She is oriented to person, place, and time and well-developed, well-nourished, and in no distress.  HENT:  Head: Normocephalic and atraumatic.  Nose: Nose normal.  Mouth/Throat: Oropharynx is clear and moist. No oropharyngeal exudate.  Eyes: Conjunctivae and EOM are normal. Pupils are equal, round, and reactive to light. Right eye exhibits no discharge. Left eye exhibits no discharge. No scleral icterus.  Neck: Normal range of motion. Neck supple. No tracheal deviation present. No thyromegaly present.  Cardiovascular: Normal rate, regular rhythm and normal heart sounds.  Exam reveals no gallop and no friction rub.   No murmur heard. Pulmonary/Chest: Effort normal and breath sounds normal. She has no wheezes. She has no rales.  Breasts: L mastectomy site, normal without palpable abnormality Abdominal: Soft. Bowel sounds are normal. She exhibits no distension and no mass. There is no tenderness. There is no rebound and no guarding.  Musculoskeletal: Normal range of motion. She exhibits no edema.  Lymphadenopathy:    She has no cervical adenopathy.  Neurological: She is alert and oriented to person, place, and time. She has normal reflexes. No cranial nerve deficit. Gait normal. Coordination normal.  Extremities: Bilateral  knee effusion Skin: Skin is warm and dry. No rash noted.  Psychiatric: Mood, memory, affect and judgment normal.  Nursing note and vitals reviewed.   LABORATORY DATA: I have reviewed the data as listed. CBC    Component Value Date/Time   WBC 4.1 05/25/2015 1024   RBC 4.48 05/25/2015 1024   HGB 12.5 05/25/2015 1024   HCT 39.3 05/25/2015 1024   PLT 187 05/25/2015 1024   MCV 87.7 05/25/2015 1024   MCH 27.9 05/25/2015 1024   MCHC 31.8 05/25/2015 1024   RDW 16.0* 05/25/2015 1024   LYMPHSABS 1.4 07/21/2014 0958   MONOABS 0.5 07/21/2014 0958   EOSABS 0.1 07/21/2014 0958   BASOSABS 0.1 07/21/2014 0958   CMP     Component Value Date/Time   NA 141 05/25/2015 1024   K 4.1 05/25/2015 1024   CL 105 05/25/2015 1024   CO2 29 05/25/2015 1024   GLUCOSE 88 05/25/2015 1024  BUN 14 05/25/2015 1024   CREATININE 0.81 05/25/2015 1024   CREATININE 0.79 02/22/2015 1102   CALCIUM 8.9 05/25/2015 1024   PROT 6.9 05/25/2015 1024   ALBUMIN 4.0 05/25/2015 1024   AST 23 05/25/2015 1024   ALT 20 05/25/2015 1024   ALKPHOS 35 05/25/2015 1024   BILITOT 0.6 05/25/2015 1024   GFRNONAA >60 02/22/2015 1102   GFRNONAA 83 09/26/2011 0955   GFRAA >60 02/22/2015 1102   GFRAA >89 09/26/2011 0955   Results for RHEDA, KASSAB (MRN [id number]) as of 09/08/2015 17:30  Ref. Range 08/25/2015 12:04  Sodium Latest Ref Range: 135-145 mmol/L 140  Potassium Latest Ref Range: 3.5-5.1 mmol/L 3.9  Chloride Latest Ref Range: 101-111 mmol/L 106  CO2 Latest Ref Range: 22-32 mmol/L 26  BUN Latest Ref Range: 6-20 mg/dL 13  Creatinine Latest Ref Range: 0.44-1.00 mg/dL 0.80  Calcium Latest Ref Range: 8.9-10.3 mg/dL 9.2  EGFR (Non-African Amer.) Latest Ref Range: >60 mL/min >60  EGFR (African American) Latest Ref Range: >60 mL/min >60  Glucose Latest Ref Range: 65-99 mg/dL 94  Anion gap Latest Ref Range: 5-15  8  Alkaline Phosphatase Latest Ref Range: 38-126 U/L 42  Albumin Latest Ref Range: 3.5-5.0 g/dL 3.9  AST Latest  Ref Range: 15-41 U/L 28  ALT Latest Ref Range: 14-54 U/L 22  Total Protein Latest Ref Range: 6.5-8.1 g/dL 7.1  Total Bilirubin Latest Ref Range: 0.3-1.2 mg/dL 0.6   Results for SYLVANA, BONK (MRN [id number]) as of 09/08/2015 17:30  Ref. Range 08/25/2015 11:28  Prothrombin Time Latest Ref Range: 11.6-15.2 seconds 28.2 (H)  INR Latest Ref Range: 0.00-1.49  2.69 (H)    RADIOLOGY: I have personally reviewed the radiological images as listed and agreed with the findings in the report. CLINICAL DATA: Screening. History of treated left breast cancer, status post mastectomy, radiation and chemotherapy in 2012.  EXAM: DIGITAL SCREENING UNILATERAL RIGHT MAMMOGRAM WITH TOMO AND CAD  COMPARISON: Previous exam(s).  ACR Breast Density Category b: There are scattered areas of fibroglandular density.  FINDINGS: There are no findings suspicious for malignancy in the right breast. Images were processed with CAD.  IMPRESSION: No mammographic evidence of malignancy. A result letter of this screening mammogram will be mailed directly to the patient.  RECOMMENDATION: Screening mammogram in one year. (Code:SM-B-01Y)  BI-RADS CATEGORY 1: Negative.   Electronically Signed  By: Fidela Salisbury M.D.  On: 07/12/2015 12:11  ASSESSMENT and THERAPY PLAN:   L breast cancer, s/p L modified radical mastectomy. 1.5cm tumor, 6/7 positive nodes. Stage IIIA ER+/PR+/Her-2 neu negative 6 cycles of Epirubicin/cytoxan, multiple dose reductions Adjuvant XRT Arimidex 1 mg daily 10/2011 2 prior DVT's, on Coumadin Osteopenia  Overall she is doing well with no evidence of recurrence. We discussed that arimidex can cause hair loss. She is not interested in discontinuing the medication. I have ordered a bone density test for her. We have refilled her arimidex today. She will return in 6 months for routine follow up. She will continue on calcium, Vitamin D, and prolea.   All questions were  answered. The patient knows to call the clinic with any problems, questions or concerns. We can certainly see the patient much sooner if necessary.   This document serves as a record of services personally performed by Ancil Linsey, MD. It was created on her behalf by Arlyce Harman, a trained medical scribe. The creation of this record is based on the scribe's personal observations and the provider's statements to them. This document has been  checked and approved by the attending provider.  I have reviewed the above documentation for accuracy and completeness, and I agree with the above.  This note is electronically signed.  Kelby Fam. Whitney Muse, MD

## 2015-09-01 ENCOUNTER — Encounter: Payer: Self-pay | Admitting: Family Medicine

## 2015-09-01 ENCOUNTER — Ambulatory Visit (INDEPENDENT_AMBULATORY_CARE_PROVIDER_SITE_OTHER): Payer: Commercial Managed Care - HMO | Admitting: Family Medicine

## 2015-09-01 VITALS — BP 138/70 | HR 76 | Temp 98.9°F | Resp 18 | Ht 65.0 in | Wt 193.0 lb

## 2015-09-01 DIAGNOSIS — E039 Hypothyroidism, unspecified: Secondary | ICD-10-CM | POA: Diagnosis not present

## 2015-09-01 DIAGNOSIS — Z1211 Encounter for screening for malignant neoplasm of colon: Secondary | ICD-10-CM

## 2015-09-01 DIAGNOSIS — R05 Cough: Secondary | ICD-10-CM

## 2015-09-01 DIAGNOSIS — I10 Essential (primary) hypertension: Secondary | ICD-10-CM

## 2015-09-01 DIAGNOSIS — R058 Other specified cough: Secondary | ICD-10-CM

## 2015-09-01 DIAGNOSIS — J309 Allergic rhinitis, unspecified: Secondary | ICD-10-CM | POA: Insufficient documentation

## 2015-09-01 DIAGNOSIS — Z0000 Encounter for general adult medical examination without abnormal findings: Secondary | ICD-10-CM

## 2015-09-01 DIAGNOSIS — J3089 Other allergic rhinitis: Secondary | ICD-10-CM

## 2015-09-01 DIAGNOSIS — E785 Hyperlipidemia, unspecified: Secondary | ICD-10-CM | POA: Diagnosis not present

## 2015-09-01 LAB — HEMOCCULT GUIAC POC 1CARD (OFFICE): FECAL OCCULT BLD: NEGATIVE

## 2015-09-01 MED ORDER — MONTELUKAST SODIUM 10 MG PO TABS: 10.0000 mg | ORAL_TABLET | Freq: Every day | ORAL | Status: DC

## 2015-09-01 NOTE — Patient Instructions (Addendum)
F/u in 4 month, call if you need me sooner  Tablet sent for daily use for cough and allergies  OK to take cod liver oil one daily and hair , skin and nail vitamin one daily  Fasting lipid, cmp , TSH, CBC in 4 month  Thanks for choosing [facility], we consider it a privelige to serve you.

## 2015-09-01 NOTE — Progress Notes (Signed)
   Subjective:    Patient ID: Lisa Klein, female    DOB: 02/01/1930, 82 y.o.   MRN: [id number]  HPI Patient is in for annual physical exam. C/o increased clear nasal drainage and cough worse at night, in past 2 to 3 weeks. No fever or chills. Recent labs, if available are reviewed. Immunization is reviewed , and  updated if needed.   Review of Systems See HPI     Objective:   Physical Exam BP 138/70 mmHg  Pulse 76  Temp(Src) 98.9 F (37.2 C)  Resp 18  Ht 5\' 5"  (1.651 m)  Wt 193 lb (87.544 kg)  BMI 32.12 kg/m2  SpO2 97%   Pleasant well nourished female, alert and oriented x 3, in no cardio-pulmonary distress. Afebrile. HEENT No facial trauma or asymetry. Sinuses non tender.  Extra occullar muscles intact, pupils equally reactive to light. External ears normal, tympanic membranes clear. Oropharynx moist, no exudate, poor dentition. No thyromegaly.No bruits.  Chest: Clear to ascultation bilaterally.No crackles or wheezes. Non tender to palpation  Breast: Left total mastectomy, right breast,no masses or lumps. No tenderness. No nipple discharge or inversion. No axillary or supraclavicular adenopathy  Cardiovascular system; Heart sounds normal,  S1 and  S2 ,no S3.  No murmur, or thrill. Apical beat not displaced Peripheral pulses normal.  Abdomen: Soft, non tender, no organomegaly or masses. No bruits. Bowel sounds normal. No guarding, tenderness or rebound.  Rectal:  Normal sphincter tone. No mass.No rectal masses.  Guaiac negative stool.  GU: External genitalia normal female genitalia , female distribution of hair. No lesions. Urethral meatus normal in size, no  Prolapse, no lesions visibly  Present. Bladder non tender. Vagina pink and moist , with no visible lesions , discharge present . Adequate pelvic support no  cystocele or rectocele noted  Uterus absent, no adnexal masses, no  adnexal tenderness.   Musculoskeletal exam: Full ROM of  spine, hips , shoulders and knees. No deformity ,swelling or crepitus noted. No muscle wasting or atrophy.   Neurologic: Cranial nerves 2 to 12 intact. Power, tone ,sensation and reflexes normal throughout. No disturbance in gait. No tremor.  Skin: Intact, no ulceration, erythema , scaling or rash noted. Pigmentation normal throughout  Psych; Normal mood and affect. Judgement and concentration normal        Assessment & Plan:  Annual physical exam Annual exam as documented. Counseling done  re healthy lifestyle involving commitment to 150 minutes exercise per week, heart healthy diet, and attaining healthy weight.The importance of adequate sleep also discussed. Regular seat belt use and home safety, is also discussed. Changes in health habits are decided on by the patient with goals and time frames  set for achieving them. Immunization and cancer screening needs are specifically addressed at this visit.    Allergic cough Uncontrolled, start singulair daily  Allergic rhinitis Uncontrolled , start daily singulair

## 2015-09-11 ENCOUNTER — Encounter: Payer: Self-pay | Admitting: Family Medicine

## 2015-09-11 NOTE — Assessment & Plan Note (Addendum)

## 2015-09-11 NOTE — Assessment & Plan Note (Signed)
Uncontrolled , start singulair daily 

## 2015-09-11 NOTE — Assessment & Plan Note (Signed)
Uncontrolled , start daily singulair 

## 2015-09-15 ENCOUNTER — Telehealth (HOSPITAL_COMMUNITY): Payer: Self-pay | Admitting: *Deleted

## 2015-09-15 NOTE — Telephone Encounter (Signed)
Nursing, please refill Coumadin x 5  KEFALAS,THOMAS, PA-C 09/15/2015 5:37 PM

## 2015-09-16 ENCOUNTER — Other Ambulatory Visit (HOSPITAL_COMMUNITY): Payer: Self-pay | Admitting: *Deleted

## 2015-09-16 ENCOUNTER — Telehealth (HOSPITAL_COMMUNITY): Payer: Self-pay | Admitting: *Deleted

## 2015-09-16 DIAGNOSIS — D6859 Other primary thrombophilia: Secondary | ICD-10-CM

## 2015-09-16 DIAGNOSIS — C50912 Malignant neoplasm of unspecified site of left female breast: Secondary | ICD-10-CM

## 2015-09-16 DIAGNOSIS — I82409 Acute embolism and thrombosis of unspecified deep veins of unspecified lower extremity: Secondary | ICD-10-CM

## 2015-09-16 MED ORDER — WARFARIN SODIUM 5 MG PO TABS
5.0000 mg | ORAL_TABLET | Freq: Once | ORAL | Status: DC
Start: 2015-09-16 — End: 2015-09-19

## 2015-09-16 NOTE — Telephone Encounter (Signed)
Contacted pt and notified of Coumadin refill (x5) sent to [facility] mail in pharmacy.  Receipt of Rx confirmed by pharmacy.

## 2015-09-19 ENCOUNTER — Other Ambulatory Visit (HOSPITAL_COMMUNITY): Payer: Self-pay | Admitting: Oncology

## 2015-09-22 ENCOUNTER — Encounter (HOSPITAL_COMMUNITY): Payer: Commercial Managed Care - HMO | Attending: Hematology & Oncology

## 2015-09-22 ENCOUNTER — Other Ambulatory Visit (HOSPITAL_COMMUNITY): Payer: Self-pay | Admitting: Oncology

## 2015-09-22 ENCOUNTER — Telehealth (HOSPITAL_COMMUNITY): Payer: Self-pay | Admitting: Emergency Medicine

## 2015-09-22 DIAGNOSIS — C50912 Malignant neoplasm of unspecified site of left female breast: Secondary | ICD-10-CM | POA: Insufficient documentation

## 2015-09-22 DIAGNOSIS — I82409 Acute embolism and thrombosis of unspecified deep veins of unspecified lower extremity: Secondary | ICD-10-CM | POA: Insufficient documentation

## 2015-09-22 DIAGNOSIS — Z7901 Long term (current) use of anticoagulants: Secondary | ICD-10-CM

## 2015-09-22 DIAGNOSIS — D6859 Other primary thrombophilia: Secondary | ICD-10-CM | POA: Diagnosis not present

## 2015-09-22 LAB — PROTIME-INR
INR: 1.23 (ref 0.00–1.49)
PROTHROMBIN TIME: 15.7 s — AB (ref 11.6–15.2)

## 2015-09-22 NOTE — Telephone Encounter (Signed)
Called pt and notified her of labs results.  Pt is to take 7.5 mg for the next 3 days then 5 mg [facility] Sunday.  Repeat INR/PT on monday

## 2015-09-26 ENCOUNTER — Encounter (HOSPITAL_COMMUNITY): Payer: Commercial Managed Care - HMO

## 2015-09-26 ENCOUNTER — Other Ambulatory Visit (HOSPITAL_COMMUNITY): Payer: Self-pay | Admitting: Emergency Medicine

## 2015-09-26 ENCOUNTER — Other Ambulatory Visit (HOSPITAL_COMMUNITY): Payer: Self-pay | Admitting: Oncology

## 2015-09-26 DIAGNOSIS — C50912 Malignant neoplasm of unspecified site of left female breast: Secondary | ICD-10-CM | POA: Diagnosis not present

## 2015-09-26 DIAGNOSIS — I82409 Acute embolism and thrombosis of unspecified deep veins of unspecified lower extremity: Secondary | ICD-10-CM | POA: Diagnosis not present

## 2015-09-26 DIAGNOSIS — D6859 Other primary thrombophilia: Secondary | ICD-10-CM | POA: Diagnosis not present

## 2015-09-26 DIAGNOSIS — Z7901 Long term (current) use of anticoagulants: Secondary | ICD-10-CM

## 2015-09-26 LAB — PROTIME-INR
INR: 1.94 — ABNORMAL HIGH (ref 0.00–1.49)
PROTHROMBIN TIME: 22.1 s — AB (ref 11.6–15.2)

## 2015-09-30 ENCOUNTER — Encounter (HOSPITAL_COMMUNITY): Payer: Commercial Managed Care - HMO

## 2015-09-30 DIAGNOSIS — Z7901 Long term (current) use of anticoagulants: Secondary | ICD-10-CM

## 2015-09-30 DIAGNOSIS — C50912 Malignant neoplasm of unspecified site of left female breast: Secondary | ICD-10-CM | POA: Diagnosis not present

## 2015-09-30 DIAGNOSIS — D6859 Other primary thrombophilia: Secondary | ICD-10-CM | POA: Diagnosis not present

## 2015-09-30 DIAGNOSIS — I82409 Acute embolism and thrombosis of unspecified deep veins of unspecified lower extremity: Secondary | ICD-10-CM | POA: Diagnosis not present

## 2015-09-30 LAB — PROTIME-INR
INR: 2.25 — ABNORMAL HIGH (ref 0.00–1.49)
PROTHROMBIN TIME: 24.7 s — AB (ref 11.6–15.2)

## 2015-10-03 ENCOUNTER — Other Ambulatory Visit (HOSPITAL_COMMUNITY): Payer: Self-pay | Admitting: Emergency Medicine

## 2015-10-03 DIAGNOSIS — Z7901 Long term (current) use of anticoagulants: Secondary | ICD-10-CM

## 2015-10-24 ENCOUNTER — Encounter (HOSPITAL_COMMUNITY): Payer: Commercial Managed Care - HMO | Attending: Hematology & Oncology

## 2015-10-24 DIAGNOSIS — I82409 Acute embolism and thrombosis of unspecified deep veins of unspecified lower extremity: Secondary | ICD-10-CM | POA: Insufficient documentation

## 2015-10-24 DIAGNOSIS — Z7901 Long term (current) use of anticoagulants: Secondary | ICD-10-CM

## 2015-10-24 DIAGNOSIS — C50912 Malignant neoplasm of unspecified site of left female breast: Secondary | ICD-10-CM | POA: Insufficient documentation

## 2015-10-24 DIAGNOSIS — D6859 Other primary thrombophilia: Secondary | ICD-10-CM | POA: Insufficient documentation

## 2015-10-24 LAB — PROTIME-INR
INR: 2.88 — AB (ref 0.00–1.49)
PROTHROMBIN TIME: 29.7 s — AB (ref 11.6–15.2)

## 2015-10-29 ENCOUNTER — Other Ambulatory Visit: Payer: Self-pay | Admitting: Family Medicine

## 2015-11-04 ENCOUNTER — Other Ambulatory Visit: Payer: Self-pay | Admitting: Family Medicine

## 2015-11-14 ENCOUNTER — Telehealth (HOSPITAL_COMMUNITY): Payer: Self-pay | Admitting: *Deleted

## 2015-11-14 ENCOUNTER — Encounter (HOSPITAL_COMMUNITY): Payer: Commercial Managed Care - HMO | Attending: Hematology & Oncology

## 2015-11-14 DIAGNOSIS — C50912 Malignant neoplasm of unspecified site of left female breast: Secondary | ICD-10-CM | POA: Insufficient documentation

## 2015-11-14 DIAGNOSIS — Z7901 Long term (current) use of anticoagulants: Secondary | ICD-10-CM

## 2015-11-14 DIAGNOSIS — I82409 Acute embolism and thrombosis of unspecified deep veins of unspecified lower extremity: Secondary | ICD-10-CM | POA: Diagnosis not present

## 2015-11-14 DIAGNOSIS — D6859 Other primary thrombophilia: Secondary | ICD-10-CM | POA: Diagnosis not present

## 2015-11-14 LAB — PROTIME-INR
INR: 2.54 — ABNORMAL HIGH (ref 0.00–1.49)
PROTHROMBIN TIME: 27 s — AB (ref 11.6–15.2)

## 2015-11-14 NOTE — Telephone Encounter (Signed)
Patient notified that INR is good. She will continue same dose of coumadin and return in 4 weeks for repeat INR

## 2015-12-12 ENCOUNTER — Encounter (HOSPITAL_COMMUNITY): Payer: Commercial Managed Care - HMO | Attending: Hematology & Oncology

## 2015-12-12 DIAGNOSIS — D6859 Other primary thrombophilia: Secondary | ICD-10-CM | POA: Diagnosis not present

## 2015-12-12 DIAGNOSIS — C50912 Malignant neoplasm of unspecified site of left female breast: Secondary | ICD-10-CM | POA: Insufficient documentation

## 2015-12-12 DIAGNOSIS — I82409 Acute embolism and thrombosis of unspecified deep veins of unspecified lower extremity: Secondary | ICD-10-CM | POA: Diagnosis not present

## 2015-12-12 DIAGNOSIS — Z7901 Long term (current) use of anticoagulants: Secondary | ICD-10-CM

## 2015-12-12 LAB — PROTIME-INR
INR: 2.2 — ABNORMAL HIGH (ref 0.00–1.49)
Prothrombin Time: 24.2 seconds — ABNORMAL HIGH (ref 11.6–15.2)

## 2015-12-22 DIAGNOSIS — E785 Hyperlipidemia, unspecified: Secondary | ICD-10-CM | POA: Diagnosis not present

## 2015-12-22 DIAGNOSIS — E039 Hypothyroidism, unspecified: Secondary | ICD-10-CM | POA: Diagnosis not present

## 2015-12-22 DIAGNOSIS — I10 Essential (primary) hypertension: Secondary | ICD-10-CM | POA: Diagnosis not present

## 2015-12-22 LAB — CBC
HCT: 39.1 % (ref 35.0–45.0)
HEMOGLOBIN: 12.4 g/dL (ref 11.7–15.5)
MCH: 27.9 pg (ref 27.0–33.0)
MCHC: 31.7 g/dL — ABNORMAL LOW (ref 32.0–36.0)
MCV: 88.1 fL (ref 80.0–100.0)
MPV: 10.6 fL (ref 7.5–12.5)
PLATELETS: 175 10*3/uL (ref 140–400)
RBC: 4.44 MIL/uL (ref 3.80–5.10)
RDW: 15.1 % — ABNORMAL HIGH (ref 11.0–15.0)
WBC: 3.5 10*3/uL — ABNORMAL LOW (ref 3.8–10.8)

## 2015-12-23 LAB — COMPREHENSIVE METABOLIC PANEL
ALBUMIN: 3.6 g/dL (ref 3.6–5.1)
ALK PHOS: 35 U/L (ref 33–130)
ALT: 16 U/L (ref 6–29)
AST: 21 U/L (ref 10–35)
BILIRUBIN TOTAL: 0.6 mg/dL (ref 0.2–1.2)
BUN: 12 mg/dL (ref 7–25)
CALCIUM: 8.7 mg/dL (ref 8.6–10.4)
CO2: 25 mmol/L (ref 20–31)
Chloride: 104 mmol/L (ref 98–110)
Creat: 0.83 mg/dL (ref 0.60–0.88)
GLUCOSE: 96 mg/dL (ref 65–99)
POTASSIUM: 3.9 mmol/L (ref 3.5–5.3)
Sodium: 142 mmol/L (ref 135–146)
Total Protein: 6.2 g/dL (ref 6.1–8.1)

## 2015-12-23 LAB — LIPID PANEL
CHOLESTEROL: 139 mg/dL (ref 125–200)
HDL: 51 mg/dL (ref >=46)
LDL CALC: 71 mg/dL (ref <130)
TRIGLYCERIDES: 84 mg/dL (ref <150)
Total CHOL/HDL Ratio: 2.7 Ratio (ref <=5.0)
VLDL: 17 mg/dL (ref <30)

## 2015-12-23 LAB — TSH: TSH: 1.99 mIU/L

## 2016-01-03 ENCOUNTER — Ambulatory Visit (INDEPENDENT_AMBULATORY_CARE_PROVIDER_SITE_OTHER): Payer: Commercial Managed Care - HMO | Admitting: Family Medicine

## 2016-01-03 ENCOUNTER — Encounter: Payer: Self-pay | Admitting: Family Medicine

## 2016-01-03 VITALS — BP 120/72 | HR 66 | Resp 18 | Ht 65.0 in | Wt 192.1 lb

## 2016-01-03 DIAGNOSIS — E559 Vitamin D deficiency, unspecified: Secondary | ICD-10-CM | POA: Diagnosis not present

## 2016-01-03 DIAGNOSIS — E669 Obesity, unspecified: Secondary | ICD-10-CM

## 2016-01-03 DIAGNOSIS — E039 Hypothyroidism, unspecified: Secondary | ICD-10-CM

## 2016-01-03 DIAGNOSIS — I10 Essential (primary) hypertension: Secondary | ICD-10-CM

## 2016-01-03 DIAGNOSIS — E785 Hyperlipidemia, unspecified: Secondary | ICD-10-CM

## 2016-01-03 NOTE — Patient Instructions (Addendum)
Annual wellness in 5 month, call if you need me before  Non fast chem7 , TsH, and vit D in 5 month  You are doing extremely well, no changes in medication  Be careful not to fall!!  Fall Prevention in the Home  Falls can cause injuries. They can happen to people of all ages. There are many things you can do to make your home safe and to help prevent falls.  WHAT CAN I DO ON THE OUTSIDE OF MY HOME?  Regularly fix the edges of walkways and driveways and fix any cracks.  Remove anything that might make you trip as you walk through a door, such as a raised step or threshold.  Trim any bushes or trees on the path to your home.  Use bright outdoor lighting.  Clear any walking paths of anything that might make someone trip, such as rocks or tools.  Regularly check to see if handrails are loose or broken. Make sure that both sides of any steps have handrails.  Any raised decks and porches should have guardrails on the edges.  Have any leaves, snow, or ice cleared regularly.  Use sand or salt on walking paths during winter.  Clean up any spills in your garage right away. This includes oil or grease spills. WHAT CAN I DO IN THE BATHROOM?   Use night lights.  Install grab bars by the toilet and in the tub and shower. Do not use towel bars as grab bars.  Use non-skid mats or decals in the tub or shower.  If you need to sit down in the shower, use a plastic, non-slip stool.  Keep the floor dry. Clean up any water that spills on the floor as soon as it happens.  Remove soap buildup in the tub or shower regularly.  Attach bath mats securely with double-sided non-slip rug tape.  Do not have throw rugs and other things on the floor that can make you trip. WHAT CAN I DO IN THE BEDROOM?  Use night lights.  Make sure that you have a light by your bed that is easy to reach.  Do not use any sheets or blankets that are too big for your bed. They should not hang down onto the  floor.  Have a firm chair that has side arms. You can use this for support while you get dressed.  Do not have throw rugs and other things on the floor that can make you trip. WHAT CAN I DO IN THE KITCHEN?  Clean up any spills right away.  Avoid walking on wet floors.  Keep items that you use a lot in easy-to-reach places.  If you need to reach something above you, use a strong step stool that has a grab bar.  Keep electrical cords out of the way.  Do not use floor polish or wax that makes floors slippery. If you must use wax, use non-skid floor wax.  Do not have throw rugs and other things on the floor that can make you trip. WHAT CAN I DO WITH MY STAIRS?  Do not leave any items on the stairs.  Make sure that there are handrails on both sides of the stairs and use them. Fix handrails that are broken or loose. Make sure that handrails are as long as the stairways.  Check any carpeting to make sure that it is firmly attached to the stairs. Fix any carpet that is loose or worn.  Avoid having throw rugs at the top  or bottom of the stairs. If you do have throw rugs, attach them to the floor with carpet tape.  Make sure that you have a light switch at the top of the stairs and the bottom of the stairs. If you do not have them, ask someone to add them for you. WHAT ELSE CAN I DO TO HELP PREVENT FALLS?  Wear shoes that:  Do not have high heels.  Have rubber bottoms.  Are comfortable and fit you well.  Are closed at the toe. Do not wear sandals.  If you use a stepladder:  Make sure that it is fully opened. Do not climb a closed stepladder.  Make sure that both sides of the stepladder are locked into place.  Ask someone to hold it for you, if possible.  Clearly mark and make sure that you can see:  Any grab bars or handrails.  First and last steps.  Where the edge of each step is.  Use tools that help you move around (mobility aids) if they are needed. These  include:  Canes.  Walkers.  Scooters.  Crutches.  Turn on the lights when you go into a dark area. Replace any light bulbs as soon as they burn out.  Set up your furniture so you have a clear path. Avoid moving your furniture around.  If any of your floors are uneven, fix them.  If there are any pets around you, be aware of where they are.  Review your medicines with your doctor. Some medicines can make you feel dizzy. This can increase your chance of falling. Ask your doctor what other things that you can do to help prevent falls.   This information is not intended to replace advice given to you by your health care provider. Make sure you discuss any questions you have with your health care provider.   Document Released: 05/19/2009 Document Revised: 12/07/2014 Document Reviewed: 08/27/2014 Elsevier Interactive Patient Education [employer].

## 2016-01-03 NOTE — Progress Notes (Signed)
Subjective:    Patient ID: Lisa Klein, female    DOB: 1930-01-10, 82 y.o.   MRN: [id number]  HPI   Lisa Klein     MRN: [id number]      DOB: [birth year]   HPI [name] is here for follow up and re-evaluation of chronic medical conditions, medication management and review of any available recent lab and radiology data.  Preventive health is updated, specifically  Cancer screening and Immunization.   Questions or concerns regarding consultations or procedures which the PT has had in the interim are  addressed. The PT denies any adverse reactions to current medications since the last visit.  There are no new concerns.  There are no specific complaints   ROS Denies recent fever or chills. Denies sinus pressure, nasal congestion, ear pain or sore throat. Denies chest congestion, productive cough or wheezing. Denies chest pains, palpitations and leg swelling Denies abdominal pain, nausea, vomiting,diarrhea or constipation.   Denies dysuria, frequency, hesitancy or incontinence. Denies joint pain, swelling and limitation in mobility. Denies headaches, seizures, numbness, or tingling. Denies depression, anxiety or insomnia. Denies skin break down or rash.   PE  BP 120/72 mmHg  Pulse 66  Resp 18  Ht 5\' 5"  (1.651 m)  Wt 192 lb 1.9 oz (87.145 kg)  BMI 31.97 kg/m2  SpO2 99%  Patient alert and oriented and in no cardiopulmonary distress.  HEENT: No facial asymmetry, EOMI,   oropharynx pink and moist.  Neck supple no JVD, no mass.  Chest: Clear to auscultation bilaterally.  CVS: S1, S2 no murmurs, no S3.Regular rate.  ABD: Soft non tender.   Ext: No edema  MS: Adequate ROM spine, shoulders, hips and knees.  Skin: Intact, no ulcerations or rash noted.  Psych: Good eye contact, normal affect. Memory intact not anxious or depressed appearing.  CNS: CN 2-12 intact, power,  normal throughout.no focal deficits noted.   Assessment & Plan   Essential  hypertension Controlled, no change in medication DASH diet and commitment to daily physical activity for a minimum of 30 minutes discussed and encouraged, as a part of hypertension management. The importance of attaining a healthy weight is also discussed.  BP/Weight 01/03/2016 09/01/2015 08/25/2015 05/02/2015 02/22/2015 12/07/2014 [id number]  Systolic BP 123456 0000000 0000000 [id number] [id number]  Diastolic BP 72 70 72 78 59 82 64  Wt. (Lbs) 192.12 193 193.25 192 190.7 188.12 -  BMI 31.97 32.12 32.16 31.95 31.73 31.3 -        Hypothyroidism Controlled, no change in medication   Hyperlipemia Controlled, no change in medication Hyperlipidemia:Low fat diet discussed and encouraged.   Lipid Panel  Lab Results  Component Value Date   CHOL 139 12/22/2015   HDL 51 12/22/2015   LDLCALC 71 12/22/2015   TRIG 84 12/22/2015   CHOLHDL 2.7 12/22/2015        Obesity unchnaged. Patient re-educated about  the importance of commitment to a  minimum of 150 minutes of exercise per week.  The importance of healthy food choices with portion control discussed. Encouraged to start a food diary, count calories and to consider  joining a support group. Sample diet sheets offered. Goals set by the patient for the next several months.   Weight /BMI 01/03/2016 09/01/2015 08/25/2015  WEIGHT 192 lb 1.9 oz 193 lb 193 lb 4 oz  HEIGHT 5\' 5"  5\' 5"  -  BMI 31.97 kg/m2 32.12 kg/m2 32.16 kg/m2    Current exercise per week 90 mins  Review of Systems     Objective:   Physical Exam        Assessment & Plan:

## 2016-01-05 NOTE — Assessment & Plan Note (Signed)
Controlled, no change in medication DASH diet and commitment to daily physical activity for a minimum of 30 minutes discussed and encouraged, as a part of hypertension management. The importance of attaining a healthy weight is also discussed.  BP/Weight 01/03/2016 09/01/2015 08/25/2015 05/02/2015 02/22/2015 12/07/2014 [id number]  Systolic BP 123456 0000000 0000000 [id number] 123456 [id number] 0000000  Diastolic BP 72 70 72 78 59 82 64  Wt. (Lbs) 192.12 193 193.25 192 190.7 188.12 -  BMI 31.97 32.12 32.16 31.95 31.73 31.3 -

## 2016-01-05 NOTE — Assessment & Plan Note (Signed)
Controlled, no change in medication  

## 2016-01-05 NOTE — Assessment & Plan Note (Signed)
unchnaged. Patient re-educated about  the importance of commitment to a  minimum of 150 minutes of exercise per week.  The importance of healthy food choices with portion control discussed. Encouraged to start a food diary, count calories and to consider  joining a support group. Sample diet sheets offered. Goals set by the patient for the next several months.   Weight /BMI 01/03/2016 09/01/2015 08/25/2015  WEIGHT 192 lb 1.9 oz 193 lb 193 lb 4 oz  HEIGHT 5\' 5"  5\' 5"  -  BMI 31.97 kg/m2 32.12 kg/m2 32.16 kg/m2    Current exercise per week 90 mins

## 2016-01-05 NOTE — Assessment & Plan Note (Signed)
Controlled, no change in medication Hyperlipidemia:Low fat diet discussed and encouraged.   Lipid Panel  Lab Results  Component Value Date   CHOL 139 12/22/2015   HDL 51 12/22/2015   LDLCALC 71 12/22/2015   TRIG 84 12/22/2015   CHOLHDL 2.7 12/22/2015

## 2016-02-22 ENCOUNTER — Ambulatory Visit (HOSPITAL_COMMUNITY): Payer: Commercial Managed Care - HMO

## 2016-02-22 ENCOUNTER — Ambulatory Visit (HOSPITAL_COMMUNITY): Payer: Commercial Managed Care - HMO | Admitting: Oncology

## 2016-02-22 ENCOUNTER — Other Ambulatory Visit (HOSPITAL_COMMUNITY): Payer: Commercial Managed Care - HMO

## 2016-02-23 ENCOUNTER — Other Ambulatory Visit: Payer: Self-pay | Admitting: Family Medicine

## 2016-02-23 ENCOUNTER — Encounter (HOSPITAL_COMMUNITY): Payer: Commercial Managed Care - HMO | Attending: Hematology & Oncology | Admitting: Oncology

## 2016-02-23 ENCOUNTER — Encounter (HOSPITAL_COMMUNITY): Payer: Self-pay | Admitting: Oncology

## 2016-02-23 ENCOUNTER — Encounter (HOSPITAL_BASED_OUTPATIENT_CLINIC_OR_DEPARTMENT_OTHER): Payer: Commercial Managed Care - HMO

## 2016-02-23 ENCOUNTER — Encounter (HOSPITAL_COMMUNITY): Payer: Commercial Managed Care - HMO

## 2016-02-23 VITALS — BP 135/65 | HR 76 | Temp 98.4°F | Resp 18 | Wt 188.2 lb

## 2016-02-23 DIAGNOSIS — D6859 Other primary thrombophilia: Secondary | ICD-10-CM | POA: Insufficient documentation

## 2016-02-23 DIAGNOSIS — I82409 Acute embolism and thrombosis of unspecified deep veins of unspecified lower extremity: Secondary | ICD-10-CM | POA: Diagnosis not present

## 2016-02-23 DIAGNOSIS — M8580 Other specified disorders of bone density and structure, unspecified site: Secondary | ICD-10-CM

## 2016-02-23 DIAGNOSIS — Z7901 Long term (current) use of anticoagulants: Secondary | ICD-10-CM

## 2016-02-23 DIAGNOSIS — Z79899 Other long term (current) drug therapy: Secondary | ICD-10-CM

## 2016-02-23 DIAGNOSIS — C50912 Malignant neoplasm of unspecified site of left female breast: Secondary | ICD-10-CM

## 2016-02-23 LAB — COMPREHENSIVE METABOLIC PANEL
ALBUMIN: 3.8 g/dL (ref 3.5–5.0)
ALK PHOS: 36 U/L — AB (ref 38–126)
ALT: 26 U/L (ref 14–54)
ANION GAP: 7 (ref 5–15)
AST: 35 U/L (ref 15–41)
BUN: 12 mg/dL (ref 6–20)
CO2: 25 mmol/L (ref 22–32)
Calcium: 8.8 mg/dL — ABNORMAL LOW (ref 8.9–10.3)
Chloride: 108 mmol/L (ref 101–111)
Creatinine, Ser: 0.82 mg/dL (ref 0.44–1.00)
GFR calc Af Amer: >60 mL/min (ref >60)
GFR calc non Af Amer: >60 mL/min (ref >60)
GLUCOSE: 105 mg/dL — AB (ref 65–99)
POTASSIUM: 3.7 mmol/L (ref 3.5–5.1)
SODIUM: 140 mmol/L (ref 135–145)
Total Bilirubin: 0.8 mg/dL (ref 0.3–1.2)
Total Protein: 6.9 g/dL (ref 6.5–8.1)

## 2016-02-23 LAB — CBC WITH DIFFERENTIAL/PLATELET
Basophils Absolute: 0 10*3/uL (ref 0.0–0.1)
Basophils Relative: 1 %
Eosinophils Absolute: 0.1 10*3/uL (ref 0.0–0.7)
Eosinophils Relative: 4 %
HEMATOCRIT: 37.9 % (ref 36.0–46.0)
HEMOGLOBIN: 12.4 g/dL (ref 12.0–15.0)
LYMPHS ABS: 1.3 10*3/uL (ref 0.7–4.0)
LYMPHS PCT: 40 %
MCH: 28.4 pg (ref 26.0–34.0)
MCHC: 32.7 g/dL (ref 30.0–36.0)
MCV: 86.9 fL (ref 78.0–100.0)
Monocytes Absolute: 0.4 10*3/uL (ref 0.1–1.0)
Monocytes Relative: 11 %
NEUTROS PCT: 44 %
Neutro Abs: 1.4 10*3/uL — ABNORMAL LOW (ref 1.7–7.7)
Platelets: 156 10*3/uL (ref 150–400)
RBC: 4.36 MIL/uL (ref 3.87–5.11)
RDW: 15.9 % — ABNORMAL HIGH (ref 11.5–15.5)
WBC: 3.1 10*3/uL — AB (ref 4.0–10.5)

## 2016-02-23 LAB — PROTIME-INR
INR: 2.46 — ABNORMAL HIGH (ref 0.00–1.49)
Prothrombin Time: 26.4 seconds — ABNORMAL HIGH (ref 11.6–15.2)

## 2016-02-23 MED ORDER — DENOSUMAB 60 MG/ML ~~LOC~~ SOLN
60.0000 mg | Freq: Once | SUBCUTANEOUS | Status: AC
  Administered 2016-02-23: 60 mg via SUBCUTANEOUS
  Filled 2016-02-23: qty 1

## 2016-02-23 NOTE — Progress Notes (Signed)
Lisa Nakayama, MD [address], [address] [place] [address]  Infiltrating ductal carcinoma of breast, left [facility]) - Plan: CBC with Differential, CBC with Differential  Osteopenia  Long term current use of anticoagulant therapy  CURRENT THERAPY: Arimidex daily beginning on 11/19/2011 which she will take for 5 years.  Vitamin K antagonist anticoagulation.  INTERVAL HISTORY: Lisa Klein 82 y.o. female returns for followup of Stage IIIA ([id number]) adenocarcinoma the left breast status post modified radical mastectomy in 02/14/2011 followed by 6 cycles of epirubicin and Cytoxan with significant dose reductions followed by radiation therapy. She was then placed on Arimidex. She'll take that for 5 years. This was a grade 3 cancer. She had multifocal invasive disease. The largest focus was 1.5 cm. She did have involving the deep surgical margin of resection. She had 6 of 7 positive nodes involved and axilla. ER +100%, PR +14%, Ki-67 are 45%, HER-2/neu nonamplified. She started Arimidex on 11/19/2011. Thus far she has no evidence recurrent disease with a negative PET scan in April 2014.  AND Osteopenia in the setting of anti-estrogen therapy.  On Ca++ and Vit D with the addition of Prolia every 6 months. AND VTE x 2.  R LE DVT in 2012 and B/L PE R>L in 2014.  On lifelong anticoagulation with Coumadin.  Her only complaint is left hand muscle cramping when in use. She notes that it does not occur at rest. She has tried mustard, pickle juice, and vinegar with minimal benefit.  She otherwise denies any complaints. She denies any new symptoms of VTE. She is very compliant with her Coumadin evidence of this is with her INR checks that are therapeutic.  Review of Systems  Constitutional: Negative.  Negative for fever and chills.  HENT: Negative.   Eyes: Negative.   Respiratory: Negative.  Negative for cough and shortness of breath.   Cardiovascular: Negative.  Negative for chest  pain.  Gastrointestinal: Negative.   Genitourinary: Negative.   Musculoskeletal:       Muscle cramping of left hand  Skin: Negative.   Neurological: Negative.   Endo/Heme/Allergies: Negative.   Psychiatric/Behavioral: Negative.     Past Medical History  Diagnosis Date  . DJD (degenerative joint disease)   . Hypertension   . Hypothyroidism   . Obesity   . Degenerative disc disease     with nerve compression   . Hypothyroidism   . Hypertension   . Clotting disorder ([place]) 05/2011    dvt, right leg  . Cancer ([place]) 2012    LEFT BREAST  . Osteopenia 08/24/2014  . Long term current use of anticoagulant therapy 12/07/2014    Pulmonary embolus     Past Surgical History  Procedure Laterality Date  . Vesicovaginal fistula closure w/ tah      [facility]  . Cholecystectomy  80'S    [facility]  . Mastectomy modified radical  02/14/11    left  . Portacath placement  04/16/2011    Procedure: INSERTION PORT-A-CATH;  Surgeon: Jamesetta So;  Location: [facility];  Service: General;  Laterality: Right;  right subclavian  . Cholecystectomy    . Port-a-cath removal N/A 12/05/2012    Procedure: MINOR REMOVAL PORT-A-CATH;  Surgeon: Jamesetta So, MD;  Location: [facility];  Service: General;  Laterality: N/A;  In Minor Room  . Hip pinning,cannulated Left 01/19/2013    Procedure: CANNULATED HIP PINNING-  left;  Surgeon: Marin Shutter, MD;  Location: [facility];  Service: Orthopedics;  Laterality: Left;  . Abdominal hysterectomy  1980    fibroids  . Breast surgery  2012    left total mastectomy    Family History  Problem Relation Age of Onset  . COPD Father   . Lung disease Father   . Hypertension Sister   . Anesthesia problems Neg Hx   . Malignant hyperthermia Neg Hx   . Hypotension Neg Hx   . Pseudochol deficiency Neg Hx     Social History   Social History  . Marital Status: Widowed    Spouse Name: N/A  . Number of Children: 2  . Years of Education: N/A   Occupational History  . [employer] part time      Social History Main Topics  . Smoking status: Never Smoker   . Smokeless tobacco: Never Used  . Alcohol Use: No  . Drug Use: No  . Sexual Activity: No   Other Topics Concern  . None   Social History Narrative     PHYSICAL EXAMINATION  ECOG PERFORMANCE STATUS: 1 - Symptomatic but completely ambulatory  Filed Vitals:   02/23/16 1057  BP: 135/65  Pulse: 76  Temp: 98.4 F (36.9 C)  Resp: 18    GENERAL:alert, no distress, well nourished, well developed, comfortable, cooperative, smiling and unaccompanied, but during examination, volunteer is with her in the exam room. SKIN: skin color, texture, turgor are normal, no rashes or significant lesions HEAD: Normocephalic, No masses, lesions, tenderness or abnormalities EYES: normal, EOMI, Conjunctiva are pink and non-injected EARS: External ears normal OROPHARYNX:lips, buccal mucosa, and tongue normal and mucous membranes are moist  NECK: supple, trachea midline LYMPH:  no palpable lymphadenopathy, no hepatosplenomegaly BREAST:not examined LUNGS: clear to auscultation and percussion HEART: regular rate & rhythm, no murmurs, no gallops, S1 normal and S2 normal ABDOMEN:abdomen soft, non-tender and normal bowel sounds BACK: Back symmetric, no curvature. EXTREMITIES:less then 2 second capillary refill, no joint deformities, effusion, or inflammation, no edema, no skin discoloration, no clubbing, no cyanosis  NEURO: alert & oriented x 3 with fluent speech, no focal motor/sensory deficits, gait normal   LABORATORY DATA: CBC    Component Value Date/Time   WBC 3.1* 02/23/2016 0931   RBC 4.36 02/23/2016 0931   HGB 12.4 02/23/2016 0931   HCT 37.9 02/23/2016 0931   PLT 156 02/23/2016 0931   MCV 86.9 02/23/2016 0931   MCH 28.4 02/23/2016 0931   MCHC 32.7 02/23/2016 0931   RDW 15.9* 02/23/2016 0931   LYMPHSABS 1.3 02/23/2016 0931   MONOABS 0.4 02/23/2016 0931   EOSABS 0.1 02/23/2016 0931   BASOSABS 0.0 02/23/2016 0931       Chemistry      Component Value Date/Time   NA 140 02/23/2016 0931   K 3.7 02/23/2016 0931   CL 108 02/23/2016 0931   CO2 25 02/23/2016 0931   BUN 12 02/23/2016 0931   CREATININE 0.82 02/23/2016 0931   CREATININE 0.83 12/22/2015 0915      Component Value Date/Time   CALCIUM 8.8* 02/23/2016 0931   ALKPHOS 36* 02/23/2016 0931   AST 35 02/23/2016 0931   ALT 26 02/23/2016 0931   BILITOT 0.8 02/23/2016 0931     Lab Results  Component Value Date   INR 2.46* 02/23/2016   INR 2.20* 12/12/2015   INR 2.54* 11/14/2015   PROTIME 25.0 10/22/2014     PENDING LABS:   RADIOGRAPHIC STUDIES:  No results found.   PATHOLOGY:    ASSESSMENT AND PLAN:  Infiltrating ductal carcinoma of breast Stage IIIA ([id number]) adenocarcinoma the left breast status post modified radical mastectomy in 02/14/2011 followed by 6 cycles of epirubicin and Cytoxan with significant dose reductions followed by radiation therapy. She was then placed on Arimidex. She'll take that for 5 years. This was a grade 3 cancer. She had multifocal invasive disease. The largest focus was 1.5 cm. She did have involving the deep surgical margin of resection. She had 6 of 7 positive nodes involved and axilla. ER +100%, PR +14%, Ki-67 are 45%, HER-2/neu nonamplified. She started Arimidex on 11/19/2011. Thus far she has no evidence recurrent disease with a negative PET scan in April 2014.   Labs today: CBC diff, CMET.  I personally reviewed and went over laboratory results with the patient.  The results are noted within this dictation. Minimal leukopenia is appreciated and this is chronic. Neutrophil count of 1.4. Other laboratory work is unimpressive. Of note, INR is therapeutic.  Labs in 6 months: CBC diff, CMET.  I personally reviewed and went over radiographic studies with the patient.  The results are noted within this dictation.  Next mammogram is due in December 2017.  We will make sure this is scheduled for the patient.  She  notes muscle cramping particularly of her left hand. She notes that it occurs mostly with use of her left hand. This rarely occurs at rest.  She has tried multiple anecdotal solutions and I will provide another today: Water with quinine.  Return in 6 months for follow-up.   Osteopenia She has osteopenia on a recent DEXA in the setting of Arimidex therapy; she also has a history of a hip fracture after a fall.    Oncology Flowsheet 08/25/2015  denosumab (PROLIA) [place] 60 mg   Prolia injection is due today if Ca++ is WNL.  Corrected calcium is 9.0 today. Compliance with calcium and vitamin D as encouraged.  Labs today: CMET.  She is due for her next bone density exam within the next 3-6 months. Orders place and this will be scheduled.  Labs in 6 months: CMET, Vit D.  Long term current use of anticoagulant therapy VTE x 2.  R LE DVT in 2012 and B/L PE R>L in 2014.  On lifelong anticoagulation with Coumadin.  Labs today: INR. INR today is therapeutic. She will continue with same dose of Coumadin with repeat INR in 4 weeks.    ORDERS PLACED FOR THIS ENCOUNTER: Orders Placed This Encounter  Procedures  . CBC with Differential  . CBC with Differential    MEDICATIONS PRESCRIBED THIS ENCOUNTER: No orders of the defined types were placed in this encounter.    THERAPY PLAN:  NCCN guidelines recommends the following surveillance for invasive breast cancer (2.2017):  A. History and Physical exam 1-4 times per year as clinically appropriate for 5 years, then annually.  B. Periodic screening for changes in family history and referral to genetics counseling as indicated  C. Educate, monitor, and refer to lymphedema management.  D. Mammography every 12 months  E. Routine imaging of reconstructed breast is not indicated.  F. In the absence of clinical signs and symptoms suggestive of recurrent disease, there is no indication for laboratory or imaging studies for metastases screening.  G. Women  on Tamoxifen: annual gynecologic assessment every 12 months if uterus is present.  H. Women on aromatase inhibitor or who experience ovarian failure secondary to treatment should have monitoring of bone health with a bone mineral density determination at baseline and  periodically thereafter.  I. Assess and encourage adherence to adjuvant endocrine therapy.  J. Evidence suggests that active lifestyle, healthy diet, limited alcohol intake, and achieving and maintaining an ideal body weight (20-25 BMI) may lead to optimal breast cancer outcomes.  All questions were answered. The patient knows to call the clinic with any problems, questions or concerns. We can certainly see the patient much sooner if necessary.  Patient and plan discussed with Dr. Ancil Linsey and she is in agreement with the aforementioned.   This note is electronically signed by: Doy Mince 02/24/2016 9:54 PM

## 2016-02-23 NOTE — Patient Instructions (Signed)
[place] at [facility] Discharge Instructions  RECOMMENDATIONS MADE BY THE CONSULTANT AND ANY TEST RESULTS WILL BE SENT TO YOUR REFERRING PHYSICIAN.  Prolia injection given today. Call for any concerns or questions Follow up as scheduled.  Thank you for choosing [place] at [facility] to provide your oncology and hematology care.  To afford each patient quality time with our provider, please arrive at least 15 minutes before your scheduled appointment time.   Beginning January 23rd 2017 lab work for the [facility] will be done in the  Main lab at [facility] on 1st floor. If you have a lab appointment with the [facility] please come in thru the  Main Entrance and check in at the main information desk  You need to re-schedule your appointment should you arrive 10 or more minutes late.  We strive to give you quality time with our providers, and arriving late affects you and other patients whose appointments are after yours.  Also, if you no show three or more times for appointments you may be dismissed from the clinic at the providers discretion.     Again, thank you for choosing [facility].  Our hope is that these requests will decrease the amount of time that you wait before being seen by our physicians.       _____________________________________________________________  Should you have questions after your visit to [facility], please contact our office at [phone] between the hours of 8:30 a.m. and 4:30 p.m.  Voicemails left after 4:30 p.m. will not be returned until the following business day.  For prescription refill requests, have your pharmacy contact our office.         Resources For Cancer Patients and their Caregivers ? American Cancer Society: Can assist with transportation, wigs, general needs, runs Look Good Feel Better.        925-670-9451 ? Cancer Care: Provides financial  assistance, online support groups, medication/co-pay assistance.  1-800-813-HOPE (251)015-0179) ? [place] Assists [place] cancer patients and their families through emotional , educational and financial support.  580-860-5441 ? [place] DSS Where to apply for food stamps, Medicaid and utility assistance. (504)199-7426 ? RCATS: Transportation to medical appointments. 819-403-2108 ? Social Security Administration: May apply for disability if have a Stage IV cancer. (334)867-6919 (216)406-6386 ? [employer], Disability and Transit Services: Assists with nutrition, care and transit needs. [place] Support Programs: @10RELATIVEDAYS @ > Cancer Support Group  2nd Tuesday of the month 1pm-2pm, Journey Room  > Creative Journey  3rd Tuesday of the month 1130am-1pm, Journey Room  > Look Good Feel Better  1st Wednesday of the month 10am-12 noon, Journey Room (Call [place] to register [phone])

## 2016-02-23 NOTE — Patient Instructions (Signed)
[place] at [facility] Discharge Instructions  RECOMMENDATIONS MADE BY THE CONSULTANT AND ANY TEST RESULTS WILL BE SENT TO YOUR REFERRING PHYSICIAN.  Exam done and seen today by Kirby Crigler Drink water with Quinine for cramping. Labs in 1 month, 6 months,  Prolia given today. Mammogram to be done in December 2017 Bone density in 6 months Stay on the same dose of coumadin, INR next month Return to see the Doctor in 6 months Call the clinic for any concerns or questions.   Thank you for choosing [place] at [facility] to provide your oncology and hematology care.  To afford each patient quality time with our provider, please arrive at least 15 minutes before your scheduled appointment time.   Beginning January 23rd 2017 lab work for the [facility] will be done in the  Main lab at [facility] on 1st floor. If you have a lab appointment with the [facility] please come in thru the  Main Entrance and check in at the main information desk  You need to re-schedule your appointment should you arrive 10 or more minutes late.  We strive to give you quality time with our providers, and arriving late affects you and other patients whose appointments are after yours.  Also, if you no show three or more times for appointments you may be dismissed from the clinic at the providers discretion.     Again, thank you for choosing [facility].  Our hope is that these requests will decrease the amount of time that you wait before being seen by our physicians.       _____________________________________________________________  Should you have questions after your visit to [facility], please contact our office at [phone] between the hours of 8:30 a.m. and 4:30 p.m.  Voicemails left after 4:30 p.m. will not be returned until the following business day.  For prescription refill requests, have your pharmacy contact our office.          Resources For Cancer Patients and their Caregivers ? American Cancer Society: Can assist with transportation, wigs, general needs, runs Look Good Feel Better.        ([phone] 149-3533 ? Cancer Care: Provides financial assistance, online support groups, medication/co-pay assistance.  1-800-813-HOPE (912) 353-6383) ? [place] Assists [place] cancer patients and their families through emotional , educational and financial support.  272-003-4050 ? [place] DSS Where to apply for food stamps, Medicaid and utility assistance. 820-458-7185 ? RCATS: Transportation to medical appointments. (575) 728-5416 ? Social Security Administration: May apply for disability if have a Stage IV cancer. (808) 028-8742 213-597-7649 ? [employer], Disability and Transit Services: Assists with nutrition, care and transit needs. [place] Support Programs: @10RELATIVEDAYS @ > Cancer Support Group  2nd Tuesday of the month 1pm-2pm, Journey Room  > Creative Journey  3rd Tuesday of the month 1130am-1pm, Journey Room  > Look Good Feel Better  1st Wednesday of the month 10am-12 noon, Journey Room (Call [place] to register [phone])

## 2016-02-23 NOTE — Assessment & Plan Note (Addendum)
VTE x 2.  R LE DVT in 2012 and B/L PE R>L in 2014.  On lifelong anticoagulation with Coumadin.  Labs today: INR. INR today is therapeutic. She will continue with same dose of Coumadin with repeat INR in 4 weeks.

## 2016-02-23 NOTE — Progress Notes (Signed)
Lisa Klein presents today for injection per MD orders. Prolia administered SQ in right Abdomen. Administration without incident. Patient tolerated well.

## 2016-02-23 NOTE — Assessment & Plan Note (Addendum)
She has osteopenia on a recent DEXA in the setting of Arimidex therapy; she also has a history of a hip fracture after a fall.    Oncology Flowsheet 08/25/2015  denosumab (PROLIA) [place] 60 mg   Prolia injection is due today if Ca++ is WNL.  Corrected calcium is 9.0 today. Compliance with calcium and vitamin D as encouraged.  Labs today: CMET.  She is due for her next bone density exam within the next 3-6 months. Orders place and this will be scheduled.  Labs in 6 months: CMET, Vit D.

## 2016-02-23 NOTE — Assessment & Plan Note (Addendum)
Stage IIIA ([id number]) adenocarcinoma the left breast status post modified radical mastectomy in 02/14/2011 followed by 6 cycles of epirubicin and Cytoxan with significant dose reductions followed by radiation therapy. She was then placed on Arimidex. She'll take that for 5 years. This was a grade 3 cancer. She had multifocal invasive disease. The largest focus was 1.5 cm. She did have involving the deep surgical margin of resection. She had 6 of 7 positive nodes involved and axilla. ER +100%, PR +14%, Ki-67 are 45%, HER-2/neu nonamplified. She started Arimidex on 11/19/2011. Thus far she has no evidence recurrent disease with a negative PET scan in April 2014.   Labs today: CBC diff, CMET.  I personally reviewed and went over laboratory results with the patient.  The results are noted within this dictation. Minimal leukopenia is appreciated and this is chronic. Neutrophil count of 1.4. Other laboratory work is unimpressive. Of note, INR is therapeutic.  Labs in 6 months: CBC diff, CMET.  I personally reviewed and went over radiographic studies with the patient.  The results are noted within this dictation.  Next mammogram is due in December 2017.  We will make sure this is scheduled for the patient.  She notes muscle cramping particularly of her left hand. She notes that it occurs mostly with use of her left hand. This rarely occurs at rest.  She has tried multiple anecdotal solutions and I will provide another today: Water with quinine.  Return in 6 months for follow-up.

## 2016-03-06 ENCOUNTER — Telehealth (HOSPITAL_COMMUNITY): Payer: Self-pay | Admitting: *Deleted

## 2016-03-06 ENCOUNTER — Other Ambulatory Visit (HOSPITAL_COMMUNITY): Payer: Self-pay | Admitting: Oncology

## 2016-03-06 DIAGNOSIS — C50912 Malignant neoplasm of unspecified site of left female breast: Secondary | ICD-10-CM

## 2016-03-06 MED ORDER — ANASTROZOLE 1 MG PO TABS: 1.0000 mg | ORAL_TABLET | Freq: Every day | ORAL | 1 refills | Status: DC

## 2016-03-26 ENCOUNTER — Other Ambulatory Visit (HOSPITAL_COMMUNITY): Payer: Commercial Managed Care - HMO

## 2016-04-03 ENCOUNTER — Encounter (HOSPITAL_COMMUNITY): Payer: Commercial Managed Care - HMO | Attending: Hematology & Oncology

## 2016-04-03 ENCOUNTER — Telehealth (HOSPITAL_COMMUNITY): Payer: Self-pay | Admitting: *Deleted

## 2016-04-03 DIAGNOSIS — Z79899 Other long term (current) drug therapy: Secondary | ICD-10-CM

## 2016-04-03 DIAGNOSIS — D6859 Other primary thrombophilia: Secondary | ICD-10-CM | POA: Insufficient documentation

## 2016-04-03 DIAGNOSIS — C50912 Malignant neoplasm of unspecified site of left female breast: Secondary | ICD-10-CM | POA: Insufficient documentation

## 2016-04-03 DIAGNOSIS — Z7901 Long term (current) use of anticoagulants: Secondary | ICD-10-CM

## 2016-04-03 DIAGNOSIS — I82409 Acute embolism and thrombosis of unspecified deep veins of unspecified lower extremity: Secondary | ICD-10-CM | POA: Diagnosis not present

## 2016-04-03 DIAGNOSIS — M8580 Other specified disorders of bone density and structure, unspecified site: Secondary | ICD-10-CM

## 2016-04-03 LAB — COMPREHENSIVE METABOLIC PANEL
ALBUMIN: 3.8 g/dL (ref 3.5–5.0)
ALK PHOS: 34 U/L — AB (ref 38–126)
ALT: 22 U/L (ref 14–54)
ANION GAP: 6 (ref 5–15)
AST: 28 U/L (ref 15–41)
BUN: 15 mg/dL (ref 6–20)
CALCIUM: 8.9 mg/dL (ref 8.9–10.3)
CHLORIDE: 108 mmol/L (ref 101–111)
CO2: 27 mmol/L (ref 22–32)
CREATININE: 0.8 mg/dL (ref 0.44–1.00)
GFR calc Af Amer: >60 mL/min (ref >60)
GFR calc non Af Amer: >60 mL/min (ref >60)
GLUCOSE: 96 mg/dL (ref 65–99)
Potassium: 3.9 mmol/L (ref 3.5–5.1)
SODIUM: 141 mmol/L (ref 135–145)
Total Bilirubin: 0.5 mg/dL (ref 0.3–1.2)
Total Protein: 6.9 g/dL (ref 6.5–8.1)

## 2016-04-03 LAB — PROTIME-INR
INR: 2.43
Prothrombin Time: 26.9 seconds — ABNORMAL HIGH (ref 11.4–15.2)

## 2016-04-03 NOTE — Telephone Encounter (Signed)
-----   Message from [facility], PA-C sent at 04/03/2016  9:46 AM EDT ----- Perfect!.  Same dose of Coumadin.  INR in 4 weeks

## 2016-04-04 NOTE — Telephone Encounter (Signed)
-----   Message from [facility], PA-C sent at 04/03/2016  9:46 AM EDT ----- Perfect!.  Same dose of Coumadin.  INR in 4 weeks

## 2016-04-05 ENCOUNTER — Ambulatory Visit (HOSPITAL_COMMUNITY): Payer: Self-pay | Admitting: *Deleted

## 2016-04-05 ENCOUNTER — Encounter (HOSPITAL_COMMUNITY): Payer: Self-pay | Admitting: *Deleted

## 2016-04-25 ENCOUNTER — Encounter (HOSPITAL_COMMUNITY): Payer: Commercial Managed Care - HMO | Attending: Hematology & Oncology

## 2016-04-25 ENCOUNTER — Other Ambulatory Visit (HOSPITAL_COMMUNITY): Payer: Self-pay | Admitting: Oncology

## 2016-04-25 DIAGNOSIS — Z79899 Other long term (current) drug therapy: Secondary | ICD-10-CM

## 2016-04-25 DIAGNOSIS — Z7901 Long term (current) use of anticoagulants: Secondary | ICD-10-CM

## 2016-04-25 DIAGNOSIS — C50912 Malignant neoplasm of unspecified site of left female breast: Secondary | ICD-10-CM

## 2016-04-25 DIAGNOSIS — M8580 Other specified disorders of bone density and structure, unspecified site: Secondary | ICD-10-CM | POA: Diagnosis not present

## 2016-04-25 LAB — COMPREHENSIVE METABOLIC PANEL
ALT: 23 U/L (ref 14–54)
AST: 27 U/L (ref 15–41)
Albumin: 3.7 g/dL (ref 3.5–5.0)
Alkaline Phosphatase: 41 U/L (ref 38–126)
BILIRUBIN TOTAL: 0.8 mg/dL (ref 0.3–1.2)
BUN: 16 mg/dL (ref 6–20)
CALCIUM: 8.4 mg/dL — AB (ref 8.9–10.3)
CO2: 26 mmol/L (ref 22–32)
CREATININE: 0.82 mg/dL (ref 0.44–1.00)
Chloride: 108 mmol/L (ref 101–111)
GFR calc Af Amer: >60 mL/min (ref >60)
Glucose, Bld: 99 mg/dL (ref 65–99)
Potassium: 3.6 mmol/L (ref 3.5–5.1)
Sodium: 136 mmol/L (ref 135–145)
Total Protein: 7 g/dL (ref 6.5–8.1)

## 2016-04-25 LAB — PROTIME-INR
INR: 1.95
Prothrombin Time: 22.5 seconds — ABNORMAL HIGH (ref 11.4–15.2)

## 2016-04-27 ENCOUNTER — Telehealth (HOSPITAL_COMMUNITY): Payer: Self-pay | Admitting: *Deleted

## 2016-04-27 NOTE — Telephone Encounter (Signed)
-----   Message from Baird Cancer, PA-C sent at 04/25/2016  5:13 PM EDT ----- Take 7.5 mg x 2 days, then back to 5/7.5 mg alternating.  INR in 2 weeks.

## 2016-04-27 NOTE — Telephone Encounter (Signed)
Pt aware of change in medication. Pt verbalized understanding.

## 2016-05-10 ENCOUNTER — Other Ambulatory Visit (HOSPITAL_COMMUNITY): Payer: Commercial Managed Care - HMO

## 2016-05-10 ENCOUNTER — Other Ambulatory Visit (HOSPITAL_COMMUNITY): Payer: Self-pay | Admitting: *Deleted

## 2016-05-10 DIAGNOSIS — C50912 Malignant neoplasm of unspecified site of left female breast: Secondary | ICD-10-CM

## 2016-05-10 DIAGNOSIS — M8580 Other specified disorders of bone density and structure, unspecified site: Secondary | ICD-10-CM

## 2016-05-10 DIAGNOSIS — Z7901 Long term (current) use of anticoagulants: Secondary | ICD-10-CM

## 2016-05-11 ENCOUNTER — Encounter (HOSPITAL_COMMUNITY): Payer: Commercial Managed Care - HMO | Attending: Hematology & Oncology

## 2016-05-11 DIAGNOSIS — M8580 Other specified disorders of bone density and structure, unspecified site: Secondary | ICD-10-CM | POA: Insufficient documentation

## 2016-05-11 DIAGNOSIS — Z79899 Other long term (current) drug therapy: Secondary | ICD-10-CM | POA: Diagnosis not present

## 2016-05-11 DIAGNOSIS — C50912 Malignant neoplasm of unspecified site of left female breast: Secondary | ICD-10-CM | POA: Insufficient documentation

## 2016-05-11 DIAGNOSIS — Z7901 Long term (current) use of anticoagulants: Secondary | ICD-10-CM

## 2016-05-11 LAB — PROTIME-INR
INR: 2.58
PROTHROMBIN TIME: 28.2 s — AB (ref 11.4–15.2)

## 2016-05-21 ENCOUNTER — Encounter (HOSPITAL_COMMUNITY): Payer: Self-pay | Admitting: *Deleted

## 2016-06-08 ENCOUNTER — Encounter (HOSPITAL_COMMUNITY): Payer: Commercial Managed Care - HMO | Attending: Hematology & Oncology

## 2016-06-08 DIAGNOSIS — Z7901 Long term (current) use of anticoagulants: Secondary | ICD-10-CM | POA: Diagnosis not present

## 2016-06-08 LAB — PROTIME-INR
INR: 2.12
Prothrombin Time: 24.1 seconds — ABNORMAL HIGH (ref 11.4–15.2)

## 2016-07-06 ENCOUNTER — Other Ambulatory Visit (HOSPITAL_COMMUNITY): Payer: Self-pay | Admitting: Oncology

## 2016-07-06 ENCOUNTER — Encounter (HOSPITAL_COMMUNITY): Payer: Commercial Managed Care - HMO | Attending: Hematology & Oncology

## 2016-07-06 DIAGNOSIS — Z7901 Long term (current) use of anticoagulants: Secondary | ICD-10-CM | POA: Diagnosis not present

## 2016-07-06 LAB — PROTIME-INR
INR: 1.89
Prothrombin Time: 22 seconds — ABNORMAL HIGH (ref 11.4–15.2)

## 2016-07-12 ENCOUNTER — Ambulatory Visit (HOSPITAL_COMMUNITY)
Admission: RE | Admit: 2016-07-12 | Discharge: 2016-07-12 | Disposition: A | Discharge status: Home / Self Care | Payer: Commercial Managed Care - HMO | Source: Ambulatory Visit | Attending: Hematology & Oncology | Admitting: Hematology & Oncology

## 2016-07-12 DIAGNOSIS — Z7901 Long term (current) use of anticoagulants: Secondary | ICD-10-CM | POA: Diagnosis not present

## 2016-07-12 DIAGNOSIS — Z79899 Other long term (current) drug therapy: Secondary | ICD-10-CM | POA: Insufficient documentation

## 2016-07-12 DIAGNOSIS — Z1231 Encounter for screening mammogram for malignant neoplasm of breast: Secondary | ICD-10-CM | POA: Diagnosis not present

## 2016-07-12 DIAGNOSIS — M85851 Other specified disorders of bone density and structure, right thigh: Secondary | ICD-10-CM | POA: Insufficient documentation

## 2016-07-12 DIAGNOSIS — C50912 Malignant neoplasm of unspecified site of left female breast: Secondary | ICD-10-CM

## 2016-07-12 DIAGNOSIS — M8580 Other specified disorders of bone density and structure, unspecified site: Secondary | ICD-10-CM

## 2016-07-16 ENCOUNTER — Other Ambulatory Visit (HOSPITAL_COMMUNITY): Payer: Self-pay | Admitting: Oncology

## 2016-07-16 DIAGNOSIS — C50912 Malignant neoplasm of unspecified site of left female breast: Secondary | ICD-10-CM

## 2016-07-18 ENCOUNTER — Other Ambulatory Visit: Payer: Self-pay | Admitting: Family Medicine

## 2016-07-18 DIAGNOSIS — E559 Vitamin D deficiency, unspecified: Secondary | ICD-10-CM | POA: Diagnosis not present

## 2016-07-18 DIAGNOSIS — I10 Essential (primary) hypertension: Secondary | ICD-10-CM | POA: Diagnosis not present

## 2016-07-18 DIAGNOSIS — E039 Hypothyroidism, unspecified: Secondary | ICD-10-CM | POA: Diagnosis not present

## 2016-07-18 DIAGNOSIS — E785 Hyperlipidemia, unspecified: Secondary | ICD-10-CM | POA: Diagnosis not present

## 2016-07-18 LAB — TSH: TSH: 2.5 mIU/L

## 2016-07-18 LAB — BASIC METABOLIC PANEL
BUN: 15 mg/dL (ref 7–25)
CALCIUM: 9.2 mg/dL (ref 8.6–10.4)
CO2: 27 mmol/L (ref 20–31)
CREATININE: 0.86 mg/dL (ref 0.60–0.88)
Chloride: 105 mmol/L (ref 98–110)
GLUCOSE: 89 mg/dL (ref 65–99)
Potassium: 4.1 mmol/L (ref 3.5–5.3)
SODIUM: 141 mmol/L (ref 135–146)

## 2016-07-19 LAB — VITAMIN D 25 HYDROXY (VIT D DEFICIENCY, FRACTURES): Vit D, 25-Hydroxy: 43 ng/mL (ref 30–100)

## 2016-07-20 LAB — LIPID PANEL
CHOLESTEROL: 146 mg/dL (ref <200)
HDL: 61 mg/dL (ref >50)
LDL Cholesterol: 72 mg/dL (ref <100)
TRIGLYCERIDES: 66 mg/dL (ref <150)
Total CHOL/HDL Ratio: 2.4 Ratio (ref <5.0)
VLDL: 13 mg/dL (ref <30)

## 2016-07-24 ENCOUNTER — Ambulatory Visit: Payer: Commercial Managed Care - HMO

## 2016-07-25 ENCOUNTER — Ambulatory Visit (INDEPENDENT_AMBULATORY_CARE_PROVIDER_SITE_OTHER): Payer: Commercial Managed Care - HMO

## 2016-07-25 VITALS — BP 130/80 | HR 64 | Temp 98.4°F | Resp 18 | Ht 65.0 in | Wt 191.1 lb

## 2016-07-25 DIAGNOSIS — Z0000 Encounter for general adult medical examination without abnormal findings: Secondary | ICD-10-CM

## 2016-07-25 DIAGNOSIS — Z23 Encounter for immunization: Secondary | ICD-10-CM | POA: Diagnosis not present

## 2016-07-25 NOTE — Assessment & Plan Note (Signed)
After obtaining informed consent, the vaccine is  administered by LPN.  

## 2016-07-25 NOTE — Progress Notes (Signed)
Subjective:   Lisa Klein is a 82 y.o. female who presents for Medicare Annual (Subsequent) preventive examination.  Review of Systems:  Cardiac Risk Factors include: advanced age (>45men, >13 women);dyslipidemia;hypertension;obesity (BMI >30kg/m2);family history of premature cardiovascular disease     Objective:     Vitals: BP 130/80   Pulse 64   Temp 98.4 F (36.9 C) (Oral)   Resp 18   Ht 5\' 5"  (1.651 m)   Wt 191 lb 1.3 oz (86.7 kg)   SpO2 96%   BMI 31.80 kg/m   Body mass index is 31.8 kg/m.   Tobacco History  Smoking Status  . Never Smoker  Smokeless Tobacco  . Never Used     Counseling given: No   Past Medical History:  Diagnosis Date  . Cancer ([place]) 2012   LEFT BREAST  . Clotting disorder ([place]) 05/2011   dvt, right leg  . Degenerative disc disease    with nerve compression   . DJD (degenerative joint disease)   . Hypertension   . Hypertension   . Hypothyroidism   . Hypothyroidism   . Long term current use of anticoagulant therapy 12/07/2014   Pulmonary embolus   . Obesity   . Osteopenia 08/24/2014   Past Surgical History:  Procedure Laterality Date  . ABDOMINAL HYSTERECTOMY  1980   fibroids  . BREAST SURGERY  2012   left total mastectomy  . CHOLECYSTECTOMY  80'S   [facility]  . CHOLECYSTECTOMY    . HIP PINNING,CANNULATED Left 01/19/2013   Procedure: CANNULATED HIP PINNING-  left;  Surgeon: Marin Shutter, MD;  Location: [facility];  Service: Orthopedics;  Laterality: Left;  [name] MASTECTOMY MODIFIED RADICAL  02/14/11   left  . PORT-A-CATH REMOVAL N/A 12/05/2012   Procedure: MINOR REMOVAL PORT-A-CATH;  Surgeon: Jamesetta So, MD;  Location: [facility];  Service: General;  Laterality: N/A;  In Minor Room  . PORTACATH PLACEMENT  04/16/2011   Procedure: INSERTION PORT-A-CATH;  Surgeon: Jamesetta So;  Location: [facility];  Service: General;  Laterality: Right;  right subclavian  . VESICOVAGINAL FISTULA CLOSURE W/ TAH     [facility]   Family History  Problem Relation Age  of Onset  . COPD Father   . Lung disease Father   . Hypertension Sister   . Anesthesia problems Neg Hx   . Malignant hyperthermia Neg Hx   . Hypotension Neg Hx   . Pseudochol deficiency Neg Hx    History  Sexual Activity  . Sexual activity: No    Outpatient Encounter Prescriptions as of 07/25/2016  Medication Sig  . acetaminophen (TYLENOL) 650 MG CR tablet Take 650 mg by mouth daily as needed for pain.   [name] anastrozole (ARIMIDEX) 1 MG tablet TAKE 1 TABLET AT BEDTIME  . aspirin EC 81 MG tablet Take 81 mg by mouth daily.  . calcium-vitamin D (OSCAL 500/200 D-3) 500-200 MG-UNIT per tablet Take 1 tablet by mouth 2 (two) times daily.   [name] Cod Liver Oil 1000 MG CAPS Take 1 capsule by mouth daily.  [name] denosumab (PROLIA) 60 MG/ML SOLN injection Inject 60 mg into the skin every 6 (six) months. Administer in upper arm, thigh, or abdomen  . docusate sodium (COLACE) 100 MG capsule Take 100 mg by mouth daily as needed for constipation. For constipation  . hydrochlorothiazide (MICROZIDE) 12.5 MG capsule TAKE 1 CAPSULE EVERY MORNING  . levothyroxine (SYNTHROID, LEVOTHROID) 100 MCG tablet TAKE 1 TABLET EVERY MORNING  . lovastatin (MEVACOR) 20 MG  tablet TAKE 1 TABLET AT BEDTIME  . Multiple Vitamins-Minerals (HAIR SKIN AND NAILS FORMULA) TABS Take 1 capsule by mouth daily.  [name] nystatin (MYCOSTATIN/NYSTOP) 100000 UNIT/GM POWD Apply to affected area tid (Patient taking differently: as needed. Apply to affected area tid)  . potassium chloride (K-DUR) 10 MEQ tablet TAKE 1 TABLET TWICE DAILY  . warfarin (COUMADIN) 5 MG tablet TAKE ONE TABLET BY MOUTH DAILY ALTERNATING WITH 1&1/2 TABLETS.  [name] pyridOXINE (VITAMIN B-6) 100 MG tablet Take 100 mg by mouth daily.   Facility-Administered Encounter Medications as of 07/25/2016  Medication  . heparin lock flush 100 unit/mL  . heparin lock flush 100 unit/mL  . sodium chloride 0.9 % injection 10 mL  . sodium chloride 0.9 % injection 10 mL    Activities of Daily  Living In your present state of health, do you have any difficulty performing the following activities: 07/25/2016 01/03/2016  Hearing? N N  Vision? N N  Difficulty concentrating or making decisions? Y N  Walking or climbing stairs? Y Y  Dressing or bathing? N N  Doing errands, shopping? N N  Preparing Food and eating ? N -  Using the Toilet? N -  In the past six months, have you accidently leaked urine? N -  Do you have problems with loss of bowel control? N -  Managing your Medications? N -  Managing your Finances? N -  Housekeeping or managing your Housekeeping? N -  Some recent data might be hidden    Patient Care Team: [name], MD as PCP - General Patrici Ranks, MD as Consulting Physician (Hematology and Oncology)    Assessment:     Exercise Activities and Dietary recommendations Current Exercise Habits: Home exercise routine, Type of exercise: stretching, Time (Minutes): 10, Frequency (Times/Week): 2, Weekly Exercise (Minutes/Week): 20, Intensity: Mild, Exercise limited by: None identified  Goals    . Exercise 3x per week (30 min per time)          Starting today 07/25/2016 patient would like to increase exercise to at least 30 minutes 3 times a week.       Fall Risk Fall Risk  07/25/2016 09/01/2015 08/24/2014 07/23/2014 10/28/2013  Falls in the past year? No No No No Yes  Number falls in past yr: - - - - 2 or more  Risk Factor Category  - - - - High Fall Risk  Risk for fall due to : Impaired vision;Impaired balance/gait Impaired mobility - - -   Depression Screen PHQ 2/9 Scores 07/25/2016 09/01/2015 08/24/2014 07/23/2014  PHQ - 2 Score 0 0 0 0     Cognitive Function  Normal   6CIT Screen 07/25/2016  What Year? 0 points  What month? 0 points  What time? 0 points  Count back from 20 0 points  Months in reverse 0 points  Repeat phrase 0 points  Total Score 0    Immunization History  Administered Date(s) Administered  . Influenza Whole  06/06/2009, 05/17/2010  . Influenza,inj,Quad PF,36+ Mos 06/29/2013, 07/15/2014, 05/02/2015, 07/25/2016  . Pneumococcal Conjugate-13 03/03/2014  . Pneumococcal Polysaccharide-23 08/07/2001  . Td 02/29/2004  . Tdap 11/13/2010  . Zoster 10/07/2006   Screening Tests Health Maintenance  Topic Date Due  . INFLUENZA VACCINE  03/06/2016  . TETANUS/TDAP  11/12/2020  . DEXA SCAN  Completed  . ZOSTAVAX  Completed  . PNA vac Low Risk Adult  Completed      Plan:  I have personally reviewed and addressed the  Medicare Annual Wellness questionnaire and have noted the following in the patient's chart:  A. Medical and social history B. Use of alcohol, tobacco or illicit drugs  C. Current medications and supplements D. Functional ability and status E.  Nutritional status F.  Physical activity G. Advance directives H. List of other physicians I.  Hospitalizations, surgeries, and ER visits in previous 12 months J.  [place] to include hearing, vision, cognitive, depression L. Referrals and appointments - none  In addition, I have reviewed and discussed with patient certain preventive protocols, quality metrics, and best practice recommendations. A written personalized care plan for preventive services as well as general preventive health recommendations were provided to patient.  Signed,   Stormy Fabian, LPN Lead Nurse Health Advisor

## 2016-07-25 NOTE — Patient Instructions (Addendum)
Health maintenance: Flu vaccine administered today   Abnormal screenings: None   Patient concerns: Dental care, community resource referral sent today. You should receive a call from Amy within the next 2 weeks.   Nurse concerns: None   Next PCP appt: In 3 months with Dr. Moshe Cipro  Advance directive discussed with patient today. Copy provided for patient to complete at home and have notarized. Patient agrees to have copy sent to our office once it is complete.   Health Maintenance, Female Introduction Adopting a healthy lifestyle and getting preventive care can go a long way to promote health and wellness. Talk with your health care provider about what schedule of regular examinations is right for you. This is a good chance for you to check in with your provider about disease prevention and staying healthy. In between checkups, there are plenty of things you can do on your own. Experts have done a lot of research about which lifestyle changes and preventive measures are most likely to keep you healthy. Ask your health care provider for more information. Weight and diet Eat a healthy diet  Be sure to include plenty of vegetables, fruits, low-fat dairy products, and lean protein.  Do not eat a lot of foods high in solid fats, added sugars, or salt.  Get regular exercise. This is one of the most important things you can do for your health.  Most adults should exercise for at least 150 minutes each week. The exercise should increase your heart rate and make you sweat (moderate-intensity exercise).  Most adults should also do strengthening exercises at least twice a week. This is in addition to the moderate-intensity exercise. Maintain a healthy weight  Body mass index (BMI) is a measurement that can be used to identify possible weight problems. It estimates body fat based on height and weight. Your health care provider can help determine your BMI and help you achieve or maintain a  healthy weight.  For females 29 years of age and older:  A BMI below 18.5 is considered underweight.  A BMI of 18.5 to 24.9 is normal.  A BMI of 25 to 29.9 is considered overweight.  A BMI of 30 and above is considered obese. Watch levels of cholesterol and blood lipids  You should start having your blood tested for lipids and cholesterol at 80 years of age, then have this test every 5 years.  You may need to have your cholesterol levels checked more often if:  Your lipid or cholesterol levels are high.  You are older than 80 years of age.  You are at high risk for heart disease. Cancer screening Lung Cancer  Lung cancer screening is recommended for adults 24-4 years old who are at high risk for lung cancer because of a history of smoking.  A yearly low-dose CT scan of the lungs is recommended for people who:  Currently smoke.  Have quit within the past 15 years.  Have at least a 30-pack-year history of smoking. A pack year is smoking an average of one pack of cigarettes a day for 1 year.  Yearly screening should continue until it has been 15 years since you quit.  Yearly screening should stop if you develop a health problem that would prevent you from having lung cancer treatment. Breast Cancer  Practice breast self-awareness. This means understanding how your breasts normally appear and feel.  It also means doing regular breast self-exams. Let your health care provider know about any changes, no matter how  small.  If you are in your 20s or 30s, you should have a clinical breast exam (CBE) by a health care provider every 1-3 years as part of a regular health exam.  If you are 40 or older, have a CBE every year. Also consider having a breast X-ray (mammogram) every year.  If you have a family history of breast cancer, talk to your health care provider about genetic screening.  If you are at high risk for breast cancer, talk to your health care provider about having  an MRI and a mammogram every year.  Breast cancer gene (BRCA) assessment is recommended for women who have family members with BRCA-related cancers. BRCA-related cancers include:  Breast.  Ovarian.  Tubal.  Peritoneal cancers.  Results of the assessment will determine the need for genetic counseling and BRCA1 and BRCA2 testing. Cervical Cancer  Your health care provider may recommend that you be screened regularly for cancer of the pelvic organs (ovaries, uterus, and vagina). This screening involves a pelvic examination, including checking for microscopic changes to the surface of your cervix (Pap test). You may be encouraged to have this screening done every 3 years, beginning at age 78.  For women ages 29-65, health care providers may recommend pelvic exams and Pap testing every 3 years, or they may recommend the Pap and pelvic exam, combined with testing for human papilloma virus (HPV), every 5 years. Some types of HPV increase your risk of cervical cancer. Testing for HPV may also be done on women of any age with unclear Pap test results.  Other health care providers may not recommend any screening for nonpregnant women who are considered low risk for pelvic cancer and who do not have symptoms. Ask your health care provider if a screening pelvic exam is right for you.  If you have had past treatment for cervical cancer or a condition that could lead to cancer, you need Pap tests and screening for cancer for at least 20 years after your treatment. If Pap tests have been discontinued, your risk factors (such as having a new sexual partner) need to be reassessed to determine if screening should resume. Some women have medical problems that increase the chance of getting cervical cancer. In these cases, your health care provider may recommend more frequent screening and Pap tests. Colorectal Cancer  This type of cancer can be detected and often prevented.  Routine colorectal cancer screening  usually begins at 80 years of age and continues through 80 years of age.  Your health care provider may recommend screening at an earlier age if you have risk factors for colon cancer.  Your health care provider may also recommend using home test kits to check for hidden blood in the stool.  A small camera at the end of a tube can be used to examine your colon directly (sigmoidoscopy or colonoscopy). This is done to check for the earliest forms of colorectal cancer.  Routine screening usually begins at age 55.  Direct examination of the colon should be repeated every 5-10 years through 80 years of age. However, you may need to be screened more often if early forms of precancerous polyps or small growths are found. Skin Cancer  Check your skin from head to toe regularly.  Tell your health care provider about any new moles or changes in moles, especially if there is a change in a mole's shape or color.  Also tell your health care provider if you have a mole that is  larger than the size of a pencil eraser.  Always use sunscreen. Apply sunscreen liberally and repeatedly throughout the day.  Protect yourself by wearing long sleeves, pants, a wide-brimmed hat, and sunglasses whenever you are outside. Heart disease, diabetes, and high blood pressure  High blood pressure causes heart disease and increases the risk of stroke. High blood pressure is more likely to develop in:  People who have blood pressure in the high end of the normal range (130-139/85-89 mm Hg).  People who are overweight or obese.  People who are African American.  If you are 54-4 years of age, have your blood pressure checked every 3-5 years. If you are 48 years of age or older, have your blood pressure checked every year. You should have your blood pressure measured twice-once when you are at a hospital or clinic, and once when you are not at a hospital or clinic. Record the average of the two measurements. To check your  blood pressure when you are not at a hospital or clinic, you can use:  An automated blood pressure machine at a pharmacy.  A home blood pressure monitor.  If you are between 35 years and 52 years old, ask your health care provider if you should take aspirin to prevent strokes.  Have regular diabetes screenings. This involves taking a blood sample to check your fasting blood sugar level.  If you are at a normal weight and have a low risk for diabetes, have this test once every three years after 80 years of age.  If you are overweight and have a high risk for diabetes, consider being tested at a younger age or more often. Preventing infection Hepatitis B  If you have a higher risk for hepatitis B, you should be screened for this virus. You are considered at high risk for hepatitis B if:  You were born in a country where hepatitis B is common. Ask your health care provider which countries are considered high risk.  Your parents were born in a high-risk country, and you have not been immunized against hepatitis B (hepatitis B vaccine).  You have HIV or AIDS.  You use needles to inject street drugs.  You live with someone who has hepatitis B.  You have had sex with someone who has hepatitis B.  You get hemodialysis treatment.  You take certain medicines for conditions, including cancer, organ transplantation, and autoimmune conditions. Hepatitis C  Blood testing is recommended for:  Everyone born from [birth year] through 1963.  Anyone with known risk factors for hepatitis C. Sexually transmitted infections (STIs)  You should be screened for sexually transmitted infections (STIs) including gonorrhea and chlamydia if:  You are sexually active and are younger than 79 years of age.  You are older than 80 years of age and your health care provider tells you that you are at risk for this type of infection.  Your sexual activity has changed since you were last screened and you are at an  increased risk for chlamydia or gonorrhea. Ask your health care provider if you are at risk.  If you do not have HIV, but are at risk, it may be recommended that you take a prescription medicine daily to prevent HIV infection. This is called pre-exposure prophylaxis (PrEP). You are considered at risk if:  You are sexually active and do not regularly use condoms or know the HIV status of your partner(s).  You take drugs by injection.  You are sexually active with a partner  who has HIV. Talk with your health care provider about whether you are at high risk of being infected with HIV. If you choose to begin PrEP, you should first be tested for HIV. You should then be tested every 3 months for as long as you are taking PrEP. Pregnancy  If you are premenopausal and you may become pregnant, ask your health care provider about preconception counseling.  If you may become pregnant, take 400 to 800 micrograms (mcg) of folic acid every day.  If you want to prevent pregnancy, talk to your health care provider about birth control (contraception). Osteoporosis and menopause  Osteoporosis is a disease in which the bones lose minerals and strength with aging. This can result in serious bone fractures. Your risk for osteoporosis can be identified using a bone density scan.  If you are 70 years of age or older, or if you are at risk for osteoporosis and fractures, ask your health care provider if you should be screened.  Ask your health care provider whether you should take a calcium or vitamin D supplement to lower your risk for osteoporosis.  Menopause may have certain physical symptoms and risks.  Hormone replacement therapy may reduce some of these symptoms and risks. Talk to your health care provider about whether hormone replacement therapy is right for you. Follow these instructions at home:  Schedule regular health, dental, and eye exams.  Stay current with your immunizations.  Do not use  any tobacco products including cigarettes, chewing tobacco, or electronic cigarettes.  If you are pregnant, do not drink alcohol.  If you are breastfeeding, limit how much and how often you drink alcohol.  Limit alcohol intake to no more than 1 drink per day for nonpregnant women. One drink equals 12 ounces of beer, 5 ounces of wine, or 1 ounces of hard liquor.  Do not use street drugs.  Do not share needles.  Ask your health care provider for help if you need support or information about quitting drugs.  Tell your health care provider if you often feel depressed.  Tell your health care provider if you have ever been abused or do not feel safe at home. This information is not intended to replace advice given to you by your health care provider. Make sure you discuss any questions you have with your health care provider. Document Released: 02/05/2011 Document Revised: 12/29/2015 Document Reviewed: 04/26/2015  2017 [employer]

## 2016-07-26 ENCOUNTER — Encounter: Payer: Self-pay | Admitting: Family Medicine

## 2016-08-24 ENCOUNTER — Encounter (HOSPITAL_BASED_OUTPATIENT_CLINIC_OR_DEPARTMENT_OTHER): Payer: Commercial Managed Care - HMO

## 2016-08-24 ENCOUNTER — Encounter (HOSPITAL_COMMUNITY): Payer: Commercial Managed Care - HMO | Attending: Hematology & Oncology

## 2016-08-24 ENCOUNTER — Encounter (HOSPITAL_BASED_OUTPATIENT_CLINIC_OR_DEPARTMENT_OTHER): Payer: Commercial Managed Care - HMO | Admitting: Oncology

## 2016-08-24 ENCOUNTER — Ambulatory Visit (HOSPITAL_COMMUNITY): Payer: Commercial Managed Care - HMO | Admitting: Oncology

## 2016-08-24 ENCOUNTER — Encounter (HOSPITAL_COMMUNITY): Payer: Self-pay | Admitting: Oncology

## 2016-08-24 VITALS — BP 150/70 | HR 92 | Temp 98.5°F | Resp 18 | Ht 65.0 in | Wt 191.0 lb

## 2016-08-24 DIAGNOSIS — Z7901 Long term (current) use of anticoagulants: Secondary | ICD-10-CM

## 2016-08-24 DIAGNOSIS — M8580 Other specified disorders of bone density and structure, unspecified site: Secondary | ICD-10-CM

## 2016-08-24 DIAGNOSIS — Z79899 Other long term (current) drug therapy: Secondary | ICD-10-CM

## 2016-08-24 DIAGNOSIS — Z86718 Personal history of other venous thrombosis and embolism: Secondary | ICD-10-CM

## 2016-08-24 DIAGNOSIS — C50912 Malignant neoplasm of unspecified site of left female breast: Secondary | ICD-10-CM

## 2016-08-24 DIAGNOSIS — C773 Secondary and unspecified malignant neoplasm of axilla and upper limb lymph nodes: Secondary | ICD-10-CM | POA: Diagnosis not present

## 2016-08-24 DIAGNOSIS — Z79811 Long term (current) use of aromatase inhibitors: Secondary | ICD-10-CM | POA: Diagnosis not present

## 2016-08-24 DIAGNOSIS — Z170 Estrogen receptor positive status [ER+]: Secondary | ICD-10-CM

## 2016-08-24 LAB — COMPREHENSIVE METABOLIC PANEL
ALBUMIN: 3.8 g/dL (ref 3.5–5.0)
ALT: 22 U/L (ref 14–54)
AST: 30 U/L (ref 15–41)
Alkaline Phosphatase: 42 U/L (ref 38–126)
Anion gap: 6 (ref 5–15)
BUN: 13 mg/dL (ref 6–20)
CHLORIDE: 105 mmol/L (ref 101–111)
CO2: 28 mmol/L (ref 22–32)
CREATININE: 1.03 mg/dL — AB (ref 0.44–1.00)
Calcium: 9 mg/dL (ref 8.9–10.3)
GFR calc Af Amer: 56 mL/min — ABNORMAL LOW (ref >60)
GFR calc non Af Amer: 49 mL/min — ABNORMAL LOW (ref >60)
Glucose, Bld: 115 mg/dL — ABNORMAL HIGH (ref 65–99)
Potassium: 3.6 mmol/L (ref 3.5–5.1)
SODIUM: 139 mmol/L (ref 135–145)
Total Bilirubin: 0.9 mg/dL (ref 0.3–1.2)
Total Protein: 7.2 g/dL (ref 6.5–8.1)

## 2016-08-24 LAB — CBC WITH DIFFERENTIAL/PLATELET
BASOS ABS: 0.1 10*3/uL (ref 0.0–0.1)
Basophils Relative: 1 %
Eosinophils Absolute: 0.1 10*3/uL (ref 0.0–0.7)
Eosinophils Relative: 2 %
HEMATOCRIT: 40.4 % (ref 36.0–46.0)
HEMOGLOBIN: 13.2 g/dL (ref 12.0–15.0)
LYMPHS PCT: 39 %
Lymphs Abs: 1.8 10*3/uL (ref 0.7–4.0)
MCH: 29.1 pg (ref 26.0–34.0)
MCHC: 32.7 g/dL (ref 30.0–36.0)
MCV: 89 fL (ref 78.0–100.0)
MONO ABS: 0.5 10*3/uL (ref 0.1–1.0)
MONOS PCT: 12 %
NEUTROS ABS: 2.1 10*3/uL (ref 1.7–7.7)
NEUTROS PCT: 46 %
Platelets: 182 10*3/uL (ref 150–400)
RBC: 4.54 MIL/uL (ref 3.87–5.11)
RDW: 15.4 % (ref 11.5–15.5)
WBC: 4.5 10*3/uL (ref 4.0–10.5)

## 2016-08-24 LAB — PROTIME-INR
INR: 2.05
Prothrombin Time: 23.4 seconds — ABNORMAL HIGH (ref 11.4–15.2)

## 2016-08-24 MED ORDER — DENOSUMAB 60 MG/ML ~~LOC~~ SOLN
60.0000 mg | Freq: Once | SUBCUTANEOUS | Status: AC
Start: 2016-08-24 — End: 2016-08-24
  Administered 2016-08-24: 60 mg via SUBCUTANEOUS
  Filled 2016-08-24: qty 1

## 2016-08-24 NOTE — Assessment & Plan Note (Signed)
VTE x 2.  R LE DVT in 2012 and B/L PE R>L in 2014.  On lifelong anticoagulation with Coumadin.  Labs today: INR. INR today is therapeutic. She will continue with same dose of Coumadin with repeat INR in 4 weeks.

## 2016-08-24 NOTE — Progress Notes (Signed)
Lisa Klein presents today for injection per MD orders. Prolia administered SQ in right Abdomen. Administration without incident. Patient tolerated well.

## 2016-08-24 NOTE — Progress Notes (Signed)
Lisa Nakayama, MD [address] [place] [address]  Infiltrating ductal carcinoma of left breast [facility]) - Plan: MM SCREENING BREAST TOMO UNI R  Osteopenia, unspecified location  Long term current use of anticoagulant therapy  CURRENT THERAPY: Arimidex daily beginning on 11/19/2011 which she will take for 5 years.  Vitamin K antagonist anticoagulation.  INTERVAL HISTORY: Lisa Klein 83 y.o. female returns for followup of Stage IIIA (T1CN2AM0) adenocarcinoma the left breast status post modified radical mastectomy in 02/14/2011 followed by 6 cycles of epirubicin and Cytoxan with significant dose reductions followed by radiation therapy. She was then placed on Arimidex. She'll take that for 5 years. This was a grade 3 cancer. She had multifocal invasive disease. The largest focus was 1.5 cm. She did have involving the deep surgical margin of resection. She had 6 of 7 positive nodes involved and axilla. ER +100%, PR +14%, Ki-67 are 45%, HER-2/neu nonamplified. She started Arimidex on 11/19/2011. Thus far she has no evidence recurrent disease with a negative PET scan in April 2014.  AND Osteopenia in the setting of anti-estrogen therapy.  On Ca++ and Vit D with the addition of Prolia every 6 months. AND VTE x 2.  R LE DVT in 2012 and B/L PE R>L in 2014.  On lifelong anticoagulation with Coumadin.  She is doing well.  She is tolerating AI therapy without any major issues.  She notes hair thinning.  She reports combing her hair and noting hair loss.    She also reports ongoing muscle cramping.  She would like to know if she can take Tumeric and I do not see any contraindications for this.    We reviewed the NCCN guidelines pertaining to ongoing AI therapy in her setting.  Given her stage of disease, she is high risk for recurrence.  BCI testing cannot be performed due to > 3 positive lymph nodes at time of surgery.  We discussed ongoing anti-estrogen therapy  versus stopping after 5 years.    Review of Systems  Constitutional: Negative.  Negative for chills, fever and weight loss.  HENT: Negative.   Eyes: Negative.   Respiratory: Negative.  Negative for cough.   Cardiovascular: Negative.  Negative for chest pain.  Gastrointestinal: Negative.  Negative for constipation, diarrhea, nausea and vomiting.  Genitourinary: Negative.   Musculoskeletal: Positive for joint pain and myalgias.  Skin: Negative.   Neurological: Negative.  Negative for weakness.  Endo/Heme/Allergies: Negative.   Psychiatric/Behavioral: Negative.     Past Medical History:  Diagnosis Date  . Cancer ([place]) 2012   LEFT BREAST  . Clotting disorder ([place]) 05/2011   dvt, right leg  . Degenerative disc disease    with nerve compression   . DJD (degenerative joint disease)   . Hypertension   . Hypertension   . Hypothyroidism   . Hypothyroidism   . Long term current use of anticoagulant therapy 12/07/2014   Pulmonary embolus   . Obesity   . Osteopenia 08/24/2014    Past Surgical History:  Procedure Laterality Date  . ABDOMINAL HYSTERECTOMY  1980   fibroids  . BREAST SURGERY  2012   left total mastectomy  . CHOLECYSTECTOMY  80'S   [facility]  . CHOLECYSTECTOMY    . HIP PINNING,CANNULATED Left 01/19/2013   Procedure: CANNULATED HIP PINNING-  left;  Surgeon: Marin Shutter, MD;  Location: [facility];  Service: Orthopedics;  Laterality: Left;  [name] MASTECTOMY MODIFIED RADICAL  02/14/11  left  . PORT-A-CATH REMOVAL N/A 12/05/2012   Procedure: MINOR REMOVAL PORT-A-CATH;  Surgeon: Jamesetta So, MD;  Location: [facility];  Service: General;  Laterality: N/A;  In Minor Room  . PORTACATH PLACEMENT  04/16/2011   Procedure: INSERTION PORT-A-CATH;  Surgeon: Jamesetta So;  Location: [facility];  Service: General;  Laterality: Right;  right subclavian  . VESICOVAGINAL FISTULA CLOSURE W/ TAH     [facility]    Family History  Problem Relation Age of Onset  . COPD Father   . Lung disease Father   .  Hypertension Sister   . Anesthesia problems Neg Hx   . Malignant hyperthermia Neg Hx   . Hypotension Neg Hx   . Pseudochol deficiency Neg Hx     Social History   Social History  . Marital status: Widowed    Spouse name: N/A  . Number of children: 2  . Years of education: N/A   Occupational History  . [employer] part time     Social History Main Topics  . Smoking status: Never Smoker  . Smokeless tobacco: Never Used  . Alcohol use No  . Drug use: No  . Sexual activity: No   Other Topics Concern  . None   Social History Narrative  . None     PHYSICAL EXAMINATION  ECOG PERFORMANCE STATUS: 1 - Symptomatic but completely ambulatory  Vitals:   08/24/16 1350  BP: (!) 150/70  Pulse: 92  Resp: 18  Temp: 98.5 F (36.9 C)    GENERAL:alert, no distress, well nourished, well developed, comfortable, cooperative, smiling and unaccompanied. SKIN: skin color, texture, turgor are normal, no rashes or significant lesions HEAD: Normocephalic, No masses, lesions, tenderness or abnormalities EYES: normal, EOMI, Conjunctiva are pink and non-injected EARS: External ears normal OROPHARYNX:lips, buccal mucosa, and tongue normal and mucous membranes are moist  NECK: supple, trachea midline LYMPH:  no palpable lymphadenopathy, no hepatosplenomegaly BREAST:not examined LUNGS: clear to auscultation and percussion HEART: regular rate & rhythm, no murmurs, no gallops, S1 normal and S2 normal ABDOMEN:abdomen soft, non-tender and normal bowel sounds BACK: Back symmetric, no curvature. EXTREMITIES:less then 2 second capillary refill, no joint deformities, effusion, or inflammation, no edema, no skin discoloration, no clubbing, no cyanosis  NEURO: alert & oriented x 3 with fluent speech, no focal motor/sensory deficits, gait normal   LABORATORY DATA: CBC    Component Value Date/Time   WBC 4.5 08/24/2016 1232   RBC 4.54 08/24/2016 1232   HGB 13.2 08/24/2016 1232   HCT 40.4 08/24/2016 1232    PLT 182 08/24/2016 1232   MCV 89.0 08/24/2016 1232   MCH 29.1 08/24/2016 1232   MCHC 32.7 08/24/2016 1232   RDW 15.4 08/24/2016 1232   LYMPHSABS 1.8 08/24/2016 1232   MONOABS 0.5 08/24/2016 1232   EOSABS 0.1 08/24/2016 1232   BASOSABS 0.1 08/24/2016 1232      Chemistry      Component Value Date/Time   NA 139 08/24/2016 1232   K 3.6 08/24/2016 1232   CL 105 08/24/2016 1232   CO2 28 08/24/2016 1232   BUN 13 08/24/2016 1232   CREATININE 1.03 (H) 08/24/2016 1232   CREATININE 0.86 07/18/2016 1108      Component Value Date/Time   CALCIUM 9.0 08/24/2016 1232   ALKPHOS 42 08/24/2016 1232   AST 30 08/24/2016 1232   ALT 22 08/24/2016 1232   BILITOT 0.9 08/24/2016 1232     Lab Results  Component Value Date   INR 2.05 08/24/2016  INR 1.89 07/06/2016   INR 2.12 06/08/2016   PROTIME 25.0 10/22/2014     PENDING LABS:   RADIOGRAPHIC STUDIES:  No results found.   PATHOLOGY:    ASSESSMENT AND PLAN:  Infiltrating ductal carcinoma of breast Stage IIIA (C3EK3TC4) adenocarcinoma the left breast status post modified radical mastectomy in 02/14/2011 followed by 6 cycles of epirubicin and Cytoxan with significant dose reductions followed by radiation therapy. She was then placed on Arimidex. She'll take that for 5 years. This was a grade 3 cancer. She had multifocal invasive disease. The largest focus was 1.5 cm. She did have involving the deep surgical margin of resection. She had 6 of 7 positive nodes involved and axilla. ER +100%, PR +14%, Ki-67 are 45%, HER-2/neu nonamplified. She started Arimidex on 11/19/2011. Thus far she has no evidence recurrent disease with a negative PET scan in April 2014.   Labs today: CBC diff, CMET.  I personally reviewed and went over laboratory results with the patient.  The results are noted within this dictation.   Labs in 6 months: CBC diff, CMET.  I personally reviewed and went over radiographic studies with the patient.  The results are noted  within this dictation.  Next mammogram is due in December 2018.  We will make sure this is scheduled for the patient.  She wishes to continue AI therapy past 5 years.  Her 5 year anniversary is in April 2018.  Given her age, despite high risk disease, it is reasonable to discontinue therapy if patient wishes.  Ongoing anti-endocrine therapy past 5 years is reasonable as well.  Return in 6 months for follow-up.   Osteopenia She has osteopenia on a recent DEXA in the setting of Arimidex therapy; she also has a history of a hip fracture after a fall.    Prolia injection is due today.   Labs today: CMET, Vit D.  I personally reviewed and went over radiographic studies with the patient.  The results are noted within this dictation.  Bone density demonstrates IMPROVEMENT.  Labs in 6 months: CMET, Vit D.  Long term current use of anticoagulant therapy VTE x 2.  R LE DVT in 2012 and B/L PE R>L in 2014.  On lifelong anticoagulation with Coumadin.  Labs today: INR. INR today is therapeutic. She will continue with same dose of Coumadin with repeat INR in 4 weeks.   ORDERS PLACED FOR THIS ENCOUNTER: Orders Placed This Encounter  Procedures  . MM SCREENING BREAST TOMO UNI R    MEDICATIONS PRESCRIBED THIS ENCOUNTER: No orders of the defined types were placed in this encounter.   THERAPY PLAN:  NCCN guidelines recommends the following surveillance for invasive breast cancer (2.2017):  A. History and Physical exam 1-4 times per year as clinically appropriate for 5 years, then annually.  B. Periodic screening for changes in family history and referral to genetics counseling as indicated  C. Educate, monitor, and refer to lymphedema management.  D. Mammography every 12 months  E. Routine imaging of reconstructed breast is not indicated.  F. In the absence of clinical signs and symptoms suggestive of recurrent disease, there is no indication for laboratory or imaging studies for metastases  screening.  G. Women on Tamoxifen: annual gynecologic assessment every 12 months if uterus is present.  H. Women on aromatase inhibitor or who experience ovarian failure secondary to treatment should have monitoring of bone health with a bone mineral density determination at baseline and periodically thereafter.  I. Assess and encourage  adherence to adjuvant endocrine therapy.  J. Evidence suggests that active lifestyle, healthy diet, limited alcohol intake, and achieving and maintaining an ideal body weight (20-25 BMI) may lead to optimal breast cancer outcomes.  All questions were answered. The patient knows to call the clinic with any problems, questions or concerns. We can certainly see the patient much sooner if necessary.  Patient and plan discussed with Dr. Ancil Linsey and she is in agreement with the aforementioned.   This note is electronically signed by: Doy Mince 08/24/2016 4:40 PM

## 2016-08-24 NOTE — Assessment & Plan Note (Signed)
She has osteopenia on a recent DEXA in the setting of Arimidex therapy; she also has a history of a hip fracture after a fall.    Prolia injection is due today.   Labs today: CMET, Vit D.  I personally reviewed and went over radiographic studies with the patient.  The results are noted within this dictation.  Bone density demonstrates IMPROVEMENT.  Labs in 6 months: CMET, Vit D.

## 2016-08-24 NOTE — Assessment & Plan Note (Addendum)
Stage IIIA (T1CN2AM0) adenocarcinoma the left breast status post modified radical mastectomy in 02/14/2011 followed by 6 cycles of epirubicin and Cytoxan with significant dose reductions followed by radiation therapy. She was then placed on Arimidex. She'll take that for 5 years. This was a grade 3 cancer. She had multifocal invasive disease. The largest focus was 1.5 cm. She did have involving the deep surgical margin of resection. She had 6 of 7 positive nodes involved and axilla. ER +100%, PR +14%, Ki-67 are 45%, HER-2/neu nonamplified. She started Arimidex on 11/19/2011. Thus far she has no evidence recurrent disease with a negative PET scan in April 2014.   Labs today: CBC diff, CMET.  I personally reviewed and went over laboratory results with the patient.  The results are noted within this dictation.   Labs in 6 months: CBC diff, CMET.  I personally reviewed and went over radiographic studies with the patient.  The results are noted within this dictation.  Next mammogram is due in December 2018.  We will make sure this is scheduled for the patient.  She wishes to continue AI therapy past 5 years.  Her 5 year anniversary is in April 2018.  Given her age, despite high risk disease, it is reasonable to discontinue therapy if patient wishes.  Ongoing anti-endocrine therapy past 5 years is reasonable as well.  Return in 6 months for follow-up.  

## 2016-08-24 NOTE — Patient Instructions (Addendum)
[place] at [facility] Discharge Instructions  RECOMMENDATIONS MADE BY THE CONSULTANT AND ANY TEST RESULTS WILL BE SENT TO YOUR REFERRING PHYSICIAN.  You were seen today by Kirby Crigler PA-C. Continue same dose of Coumadin, and Arimidex. Labs in 4 weeks. Return in 6 months for follow up, labs and Prolia.  Thank you for choosing [place] at [facility] to provide your oncology and hematology care.  To afford each patient quality time with our provider, please arrive at least 15 minutes before your scheduled appointment time.    If you have a lab appointment with the [facility] please come in thru the  Main Entrance and check in at the main information desk  You need to re-schedule your appointment should you arrive 10 or more minutes late.  We strive to give you quality time with our providers, and arriving late affects you and other patients whose appointments are after yours.  Also, if you no show three or more times for appointments you may be dismissed from the clinic at the providers discretion.     Again, thank you for choosing [facility].  Our hope is that these requests will decrease the amount of time that you wait before being seen by our physicians.       _____________________________________________________________  Should you have questions after your visit to [facility], please contact our office at [phone] between the hours of 8:30 a.m. and 4:30 p.m.  Voicemails left after 4:30 p.m. will not be returned until the following business day.  For prescription refill requests, have your pharmacy contact our office.       Resources For Cancer Patients and their Caregivers ? American Cancer Society: Can assist with transportation, wigs, general needs, runs Look Good Feel Better.        (418)528-6535 ? Cancer Care: Provides financial assistance, online support groups, medication/co-pay  assistance.  1-800-813-HOPE 364-880-0957) ? [facility] Assists [place] cancer patients and their families through emotional , educational and financial support.  (757)266-3216 ? [place] DSS Where to apply for food stamps, Medicaid and utility assistance. (734) 197-7536 ? RCATS: Transportation to medical appointments. (754)817-0554 ? Social Security Administration: May apply for disability if have a Stage IV cancer. 917-821-4205 517 036 5029 ? [employer], Disability and Transit Services: Assists with nutrition, care and transit needs. [place] Support Programs: @10RELATIVEDAYS @ > Cancer Support Group  2nd Tuesday of the month 1pm-2pm, Journey Room  > Creative Journey  3rd Tuesday of the month 1130am-1pm, Journey Room  > Look Good Feel Better  1st Wednesday of the month 10am-12 noon, Journey Room (Call [facility] to register [phone])

## 2016-08-25 LAB — VITAMIN D 25 HYDROXY (VIT D DEFICIENCY, FRACTURES): Vit D, 25-Hydroxy: 39.7 ng/mL (ref 30.0–100.0)

## 2016-08-28 ENCOUNTER — Ambulatory Visit (HOSPITAL_COMMUNITY): Payer: Commercial Managed Care - HMO | Admitting: Oncology

## 2016-09-03 ENCOUNTER — Other Ambulatory Visit: Payer: Self-pay | Admitting: Family Medicine

## 2016-09-10 ENCOUNTER — Other Ambulatory Visit (HOSPITAL_COMMUNITY): Payer: Self-pay | Admitting: Oncology

## 2016-09-10 ENCOUNTER — Other Ambulatory Visit: Payer: Self-pay | Admitting: Family Medicine

## 2016-09-24 ENCOUNTER — Other Ambulatory Visit (HOSPITAL_COMMUNITY): Payer: Commercial Managed Care - HMO

## 2016-10-02 ENCOUNTER — Encounter (HOSPITAL_COMMUNITY): Payer: Medicare HMO | Attending: Oncology

## 2016-10-02 DIAGNOSIS — C50912 Malignant neoplasm of unspecified site of left female breast: Secondary | ICD-10-CM | POA: Diagnosis not present

## 2016-10-02 DIAGNOSIS — Z7901 Long term (current) use of anticoagulants: Secondary | ICD-10-CM | POA: Insufficient documentation

## 2016-10-02 LAB — PROTIME-INR
INR: 2.42
Prothrombin Time: 26.8 seconds — ABNORMAL HIGH (ref 11.4–15.2)

## 2016-10-03 ENCOUNTER — Other Ambulatory Visit: Payer: Self-pay | Admitting: Family Medicine

## 2016-10-04 ENCOUNTER — Ambulatory Visit (INDEPENDENT_AMBULATORY_CARE_PROVIDER_SITE_OTHER): Payer: Medicare HMO | Admitting: Family Medicine

## 2016-10-04 ENCOUNTER — Encounter: Payer: Self-pay | Admitting: Family Medicine

## 2016-10-04 VITALS — BP 128/80 | HR 100 | Resp 15 | Ht 65.0 in | Wt 195.0 lb

## 2016-10-04 DIAGNOSIS — Z0000 Encounter for general adult medical examination without abnormal findings: Secondary | ICD-10-CM | POA: Diagnosis not present

## 2016-10-04 DIAGNOSIS — E785 Hyperlipidemia, unspecified: Secondary | ICD-10-CM

## 2016-10-04 DIAGNOSIS — I10 Essential (primary) hypertension: Secondary | ICD-10-CM

## 2016-10-04 DIAGNOSIS — E039 Hypothyroidism, unspecified: Secondary | ICD-10-CM

## 2016-10-04 NOTE — Patient Instructions (Addendum)
F/u  Mid September, call if you need me before Fasting labs mid July  I recommend you continue arimidex after reviewing most recent oncology note  We will refill your medications  Please be careful not to fall  Continue all of your current medications  Thank you  for choosing [facility]. We consider it a privelige to serve you.  Delivering excellent health care in a caring and  compassionate way is our goal.  Partnering with you,  so that together we can achieve this goal is our strategy.

## 2016-10-06 ENCOUNTER — Encounter: Payer: Self-pay | Admitting: Family Medicine

## 2016-10-06 NOTE — Assessment & Plan Note (Signed)
Annual exam as documented. . Immunization and cancer screening needs are specifically addressed at this visit.  

## 2016-10-06 NOTE — Progress Notes (Signed)
    ANTOINNETTE Klein     MRN: [id number]      DOB: 07-16-1930  HPI: Patient is in for annual physical exam. No other health concerns are expressed or addressed at the visit. Recent labs, if available are reviewed. Immunization is reviewed , and  updated if needed.   PE: BP 128/80   Pulse 100   Resp 15   Ht 5\' 5"  (1.651 m)   Wt 195 lb (88.5 kg)   SpO2 97%   BMI 32.45 kg/m   Pleasant  female, alert and oriented x 3, in no cardio-pulmonary distress. Afebrile. HEENT No facial trauma or asymetry. Sinuses non tender.  Extra occullar muscles intact, pupils equally reactive to light. External ears normal, tympanic membranes clear. Oropharynx moist, no exudate. Neck: supple, no adenopathy,JVD or thyromegaly.No bruits.  Chest: Clear to ascultation bilaterally.No crackles or wheezes. Non tender to palpation  Breast: Left mastectomy Right breast:no masses or lumps. No tenderness. No nipple discharge or inversion. No axillary or supraclavicular adenopathy  Cardiovascular system; Heart sounds normal,  S1 and  S2 ,no S3.  No murmur, or thrill. Apical beat not displaced Peripheral pulses normal.  Abdomen: Soft, non tender, no organomegaly or masses. No bruits. Bowel sounds normal. No guarding, tenderness or rebound.  Rectal:  Normal sphincter tone. No rectal mass. Guaiac negative stool.  GU:  Not examined Asymptomatic, not indicated  Musculoskeletal exam: Decreased  ROM of spine, hips , shoulders and knees. No deformity ,swelling or crepitus noted. No muscle wasting or atrophy.   Neurologic: Cranial nerves 2 to 12 intact. Power, tone ,sensation and reflexes normal throughout. No disturbance in gait. No tremor.  Skin: Intact, no ulceration, erythema , scaling or rash noted. Pigmentation normal throughout  Psych; Normal mood and affect. Judgement and concentration normal   Assessment & Plan:  Annual physical exam Annual exam as documented.  Immunization  and cancer screening needs are specifically addressed at this visit.

## 2016-10-30 ENCOUNTER — Encounter (HOSPITAL_COMMUNITY): Payer: Medicare HMO | Attending: Oncology

## 2016-10-30 DIAGNOSIS — Z7901 Long term (current) use of anticoagulants: Secondary | ICD-10-CM

## 2016-10-30 LAB — PROTIME-INR
INR: 1.91
Prothrombin Time: 22.1 seconds — ABNORMAL HIGH (ref 11.4–15.2)

## 2016-10-31 ENCOUNTER — Encounter (HOSPITAL_COMMUNITY): Payer: Self-pay | Admitting: *Deleted

## 2016-11-06 ENCOUNTER — Encounter (HOSPITAL_COMMUNITY): Payer: Medicare HMO | Attending: Oncology

## 2016-11-06 DIAGNOSIS — Z853 Personal history of malignant neoplasm of breast: Secondary | ICD-10-CM | POA: Insufficient documentation

## 2016-11-06 DIAGNOSIS — Z7901 Long term (current) use of anticoagulants: Secondary | ICD-10-CM | POA: Diagnosis not present

## 2016-11-06 DIAGNOSIS — Z86718 Personal history of other venous thrombosis and embolism: Secondary | ICD-10-CM | POA: Diagnosis not present

## 2016-11-06 DIAGNOSIS — M8580 Other specified disorders of bone density and structure, unspecified site: Secondary | ICD-10-CM | POA: Insufficient documentation

## 2016-11-06 LAB — PROTIME-INR
INR: 2.19
PROTHROMBIN TIME: 24.7 s — AB (ref 11.4–15.2)

## 2016-11-19 ENCOUNTER — Other Ambulatory Visit: Payer: Self-pay | Admitting: Family Medicine

## 2016-12-04 ENCOUNTER — Other Ambulatory Visit (HOSPITAL_COMMUNITY): Payer: Medicare HMO

## 2016-12-05 ENCOUNTER — Other Ambulatory Visit (HOSPITAL_COMMUNITY): Payer: Self-pay | Admitting: Oncology

## 2016-12-05 DIAGNOSIS — C50912 Malignant neoplasm of unspecified site of left female breast: Secondary | ICD-10-CM

## 2016-12-06 ENCOUNTER — Other Ambulatory Visit (HOSPITAL_COMMUNITY): Payer: Self-pay

## 2016-12-06 ENCOUNTER — Encounter (HOSPITAL_COMMUNITY): Payer: Medicare HMO | Attending: Oncology

## 2016-12-06 DIAGNOSIS — Z7901 Long term (current) use of anticoagulants: Secondary | ICD-10-CM

## 2016-12-06 LAB — PROTIME-INR
INR: 2.01
PROTHROMBIN TIME: 23 s — AB (ref 11.4–15.2)

## 2017-01-03 ENCOUNTER — Encounter (HOSPITAL_COMMUNITY): Payer: Medicare HMO

## 2017-01-03 DIAGNOSIS — Z7901 Long term (current) use of anticoagulants: Secondary | ICD-10-CM | POA: Diagnosis not present

## 2017-01-03 LAB — PROTIME-INR
INR: 2.02
Prothrombin Time: 23.2 seconds — ABNORMAL HIGH (ref 11.4–15.2)

## 2017-01-23 ENCOUNTER — Other Ambulatory Visit: Payer: Self-pay | Admitting: Family Medicine

## 2017-02-05 ENCOUNTER — Encounter (HOSPITAL_COMMUNITY): Payer: Medicare HMO | Attending: Oncology

## 2017-02-05 DIAGNOSIS — C50912 Malignant neoplasm of unspecified site of left female breast: Secondary | ICD-10-CM | POA: Diagnosis not present

## 2017-02-05 DIAGNOSIS — Z7901 Long term (current) use of anticoagulants: Secondary | ICD-10-CM

## 2017-02-05 LAB — PROTIME-INR
INR: 2.1
Prothrombin Time: 23.9 seconds — ABNORMAL HIGH (ref 11.4–15.2)

## 2017-02-21 ENCOUNTER — Other Ambulatory Visit: Payer: Self-pay | Admitting: Family Medicine

## 2017-02-22 ENCOUNTER — Encounter (HOSPITAL_COMMUNITY): Payer: Medicare HMO

## 2017-02-22 ENCOUNTER — Encounter (HOSPITAL_BASED_OUTPATIENT_CLINIC_OR_DEPARTMENT_OTHER): Payer: Medicare HMO | Admitting: Oncology

## 2017-02-22 ENCOUNTER — Encounter (HOSPITAL_BASED_OUTPATIENT_CLINIC_OR_DEPARTMENT_OTHER): Payer: Medicare HMO

## 2017-02-22 ENCOUNTER — Other Ambulatory Visit (HOSPITAL_COMMUNITY): Payer: Commercial Managed Care - HMO

## 2017-02-22 ENCOUNTER — Ambulatory Visit (HOSPITAL_COMMUNITY): Payer: Medicare HMO

## 2017-02-22 VITALS — BP 122/67 | HR 72 | Temp 98.2°F | Resp 20 | Wt 194.8 lb

## 2017-02-22 DIAGNOSIS — C773 Secondary and unspecified malignant neoplasm of axilla and upper limb lymph nodes: Secondary | ICD-10-CM

## 2017-02-22 DIAGNOSIS — M8580 Other specified disorders of bone density and structure, unspecified site: Secondary | ICD-10-CM

## 2017-02-22 DIAGNOSIS — C50912 Malignant neoplasm of unspecified site of left female breast: Secondary | ICD-10-CM

## 2017-02-22 DIAGNOSIS — I2699 Other pulmonary embolism without acute cor pulmonale: Secondary | ICD-10-CM | POA: Diagnosis not present

## 2017-02-22 DIAGNOSIS — Z170 Estrogen receptor positive status [ER+]: Secondary | ICD-10-CM

## 2017-02-22 DIAGNOSIS — Z79811 Long term (current) use of aromatase inhibitors: Secondary | ICD-10-CM

## 2017-02-22 DIAGNOSIS — Z79899 Other long term (current) drug therapy: Secondary | ICD-10-CM

## 2017-02-22 DIAGNOSIS — Z7901 Long term (current) use of anticoagulants: Secondary | ICD-10-CM | POA: Diagnosis not present

## 2017-02-22 DIAGNOSIS — Z86718 Personal history of other venous thrombosis and embolism: Secondary | ICD-10-CM

## 2017-02-22 LAB — COMPREHENSIVE METABOLIC PANEL
ALK PHOS: 35 U/L — AB (ref 38–126)
ALT: 22 U/L (ref 14–54)
AST: 32 U/L (ref 15–41)
Albumin: 3.7 g/dL (ref 3.5–5.0)
Anion gap: 7 (ref 5–15)
BUN: 14 mg/dL (ref 6–20)
CALCIUM: 9.1 mg/dL (ref 8.9–10.3)
CO2: 26 mmol/L (ref 22–32)
CREATININE: 0.87 mg/dL (ref 0.44–1.00)
Chloride: 107 mmol/L (ref 101–111)
GFR calc non Af Amer: 59 mL/min — ABNORMAL LOW (ref >60)
Glucose, Bld: 89 mg/dL (ref 65–99)
Potassium: 3.8 mmol/L (ref 3.5–5.1)
Sodium: 140 mmol/L (ref 135–145)
TOTAL PROTEIN: 6.8 g/dL (ref 6.5–8.1)
Total Bilirubin: 0.7 mg/dL (ref 0.3–1.2)

## 2017-02-22 LAB — PROTIME-INR
INR: 2
PROTHROMBIN TIME: 23 s — AB (ref 11.4–15.2)

## 2017-02-22 MED ORDER — DENOSUMAB 60 MG/ML ~~LOC~~ SOLN
60.0000 mg | Freq: Once | SUBCUTANEOUS | Status: AC
  Administered 2017-02-22: 60 mg via SUBCUTANEOUS
  Filled 2017-02-22: qty 1

## 2017-02-22 NOTE — Patient Instructions (Signed)
[facility] at [facility] Discharge Instructions  RECOMMENDATIONS MADE BY THE CONSULTANT AND ANY TEST RESULTS WILL BE SENT TO YOUR REFERRING PHYSICIAN.    Thank you for choosing [facility] at [facility] to provide your oncology and hematology care.  To afford each patient quality time with our provider, please arrive at least 15 minutes before your scheduled appointment time.    If you have a lab appointment with the Cancer Center please come in thru the  Main Entrance and check in at the main information desk  You need to re-schedule your appointment should you arrive 10 or more minutes late.  We strive to give you quality time with our providers, and arriving late affects you and other patients whose appointments are after yours.  Also, if you no show three or more times for appointments you may be dismissed from the clinic at the providers discretion.     Again, thank you for choosing [facility].  Our hope is that these requests will decrease the amount of time that you wait before being seen by our physicians.       _____________________________________________________________  Should you have questions after your visit to [facility], please contact our office at [phone] between the hours of 8:30 a.m. and 4:30 p.m.  Voicemails left after 4:30 p.m. will not be returned until the following business day.  For prescription refill requests, have your pharmacy contact our office.       Resources For Cancer Patients and their Caregivers ? American Cancer Society: Can assist with transportation, wigs, general needs, runs Look Good Feel Better.        1-888-227-6333 ? Cancer Care: Provides financial assistance, online support groups, medication/co-pay assistance.  1-800-813-HOPE (4673) ? [facility] Assists [place] cancer patients and their families through emotional , educational  and financial support.  336-427-4357 ? [place] DSS Where to apply for food stamps, Medicaid and utility assistance. 336-342-1394 ? RCATS: Transportation to medical appointments. 336-347-2287 ? Social Security Administration: May apply for disability if have a Stage IV cancer. 336-342-7796 1-800-772-1213 ? [place] [employer], Disability and Transit Services: Assists with nutrition, care and transit needs. 336-349-2343  Cancer Center Support Programs: @10RELATIVEDAYS@ > Cancer Support Group  2nd Tuesday of the month 1pm-2pm, Journey Room  > Creative Journey  3rd Tuesday of the month 1130am-1pm, Journey Room  > Look Good Feel Better  1st Wednesday of the month 10am-12 noon, Journey Room (Call [employer] to register 1-800-395-5775)   

## 2017-02-22 NOTE — Progress Notes (Signed)
Lisa Klein presents today for injection per MD orders. Prolia 60mg  administered SQ in right Abdomen. Administration without incident. Patient tolerated well.

## 2017-02-22 NOTE — Progress Notes (Signed)
Lisa Klein, Lisa Klein, [address] [place] [address]  Infiltrating ductal carcinoma of left breast [facility]) - Plan: CBC with Differential, Comprehensive metabolic panel  CURRENT THERAPY: Arimidex daily beginning on 11/19/2011 which she will take for 5 years.  Vitamin K antagonist anticoagulation.  INTERVAL HISTORY: Lisa Klein 83 y.o. female returns for followup of Stage IIIA ([id number]) adenocarcinoma the left breast status post modified radical mastectomy in 02/14/2011 followed by 6 cycles of epirubicin and Cytoxan with significant dose reductions followed by radiation therapy. She was then placed on Arimidex. She'll take that for 5 years. This was a grade 3 cancer. She had multifocal invasive disease. The largest focus was 1.5 cm. She did have involving the deep surgical margin of resection. She had 6 of 7 positive nodes involved and axilla. ER +100%, PR +14%, Ki-67 are 45%, HER-2/neu nonamplified. She started Arimidex on 11/19/2011. Thus far she has no evidence recurrent disease with a negative PET scan in April 2014.  AND Osteopenia in the setting of anti-estrogen therapy.  On Ca++ and Vit D with the addition of Prolia every 6 months. AND VTE x 2.  R LE DVT in 2012 and B/L PE R>L in 2014.  On lifelong anticoagulation with Coumadin.   Patient presents today for continued follow-up. She states that she has been doing well since her last visit. Other than chronic arthritic pains patient has no complaints today. She has not palpated any new right breast masses or new left chest wall nodules. She is tolerating Arimidex well with minimal side effects.  Review of Systems  Constitutional: Negative.  Negative for chills, fever and weight loss.  HENT: Negative.   Eyes: Negative.   Respiratory: Negative.  Negative for cough.   Cardiovascular: Negative.  Negative for chest pain.  Gastrointestinal: Negative.  Negative for constipation, diarrhea, nausea and vomiting.    Genitourinary: Negative.   Musculoskeletal: Positive for joint pain and myalgias.  Skin: Negative.   Neurological: Negative.  Negative for weakness.  Endo/Heme/Allergies: Negative.   Psychiatric/Behavioral: Negative.     Past Medical History:  Diagnosis Date  . Cancer ([place]) 2012   LEFT BREAST  . Clotting disorder ([place]) 05/2011   dvt, right leg  . Degenerative disc disease    with nerve compression   . DJD (degenerative joint disease)   . Hypertension   . Hypertension   . Hypothyroidism   . Hypothyroidism   . Long term current use of anticoagulant therapy 12/07/2014   Pulmonary embolus   . Obesity   . Osteopenia 08/24/2014    Past Surgical History:  Procedure Laterality Date  . ABDOMINAL HYSTERECTOMY  1980   fibroids  . BREAST SURGERY  2012   left total mastectomy  . CHOLECYSTECTOMY  80'S   [facility]  . CHOLECYSTECTOMY    . HIP PINNING,CANNULATED Left 01/19/2013   Procedure: CANNULATED HIP PINNING-  left;  Surgeon: Marin Shutter, MD;  Location: [place];  Service: Orthopedics;  Laterality: Left;  [name] MASTECTOMY MODIFIED RADICAL  02/14/11   left  . PORT-A-CATH REMOVAL N/A 12/05/2012   Procedure: MINOR REMOVAL PORT-A-CATH;  Surgeon: Jamesetta So, MD;  Location: [facility];  Service: General;  Laterality: N/A;  In Minor Room  . PORTACATH PLACEMENT  04/16/2011   Procedure: INSERTION PORT-A-CATH;  Surgeon: Jamesetta So;  Location: [facility];  Service: General;  Laterality: Right;  right subclavian  . VESICOVAGINAL FISTULA CLOSURE W/ TAH  [facility]    Family History  Problem Relation Age of Onset  . COPD Father   . Lung disease Father   . Hypertension Sister   . Anesthesia problems Neg Hx   . Malignant hyperthermia Neg Hx   . Hypotension Neg Hx   . Pseudochol deficiency Neg Hx     Social History   Social History  . Marital status: Widowed    Spouse name: N/A  . Number of children: 2  . Years of education: N/A   Occupational History  . [employer] part time     Social History Main  Topics  . Smoking status: Never Smoker  . Smokeless tobacco: Never Used  . Alcohol use No  . Drug use: No  . Sexual activity: No   Other Topics Concern  . Not on file   Social History Narrative  . No narrative on file     PHYSICAL EXAMINATION  ECOG PERFORMANCE STATUS: 1 - Symptomatic but completely ambulatory  Vitals:   02/22/17 0937  BP: 122/67  Pulse: 72  Resp: 20  Temp: 98.2 F (36.8 C)    GENERAL:alert, no distress, well nourished, well developed, comfortable, cooperative, smiling and unaccompanied. SKIN: skin color, texture, turgor are normal, no rashes or significant lesions HEAD: Normocephalic, No masses, lesions, tenderness or abnormalities EYES: normal, EOMI, Conjunctiva are pink and non-injected EARS: External ears normal OROPHARYNX:lips, buccal mucosa, and tongue normal and mucous membranes are moist  NECK: supple, trachea midline LYMPH:  no palpable lymphadenopathy, no hepatosplenomegaly BREAST: Left chest wall without any subcutaneous nodules or axillary lymphadenopathy. Right breast is pendulous, no masses palpatedin changes, no nipple discharge, no axillary lymphadenopathy. LUNGS: clear to auscultation and percussion HEART: regular rate & rhythm, no murmurs, no gallops, S1 normal and S2 normal ABDOMEN:abdomen soft, non-tender and normal bowel sounds BACK: Back symmetric, no curvature. EXTREMITIES:less then 2 second capillary refill, no joint deformities, effusion, or inflammation, no edema, no skin discoloration, no clubbing, no cyanosis  NEURO: alert & oriented x 3 with fluent speech, no focal motor/sensory deficits, gait normal   LABORATORY DATA: CBC    Component Value Date/Time   WBC 4.5 08/24/2016 1232   RBC 4.54 08/24/2016 1232   HGB 13.2 08/24/2016 1232   HCT 40.4 08/24/2016 1232   PLT 182 08/24/2016 1232   MCV 89.0 08/24/2016 1232   MCH 29.1 08/24/2016 1232   MCHC 32.7 08/24/2016 1232   RDW 15.4 08/24/2016 1232   LYMPHSABS 1.8 08/24/2016  1232   MONOABS 0.5 08/24/2016 1232   EOSABS 0.1 08/24/2016 1232   BASOSABS 0.1 08/24/2016 1232      Chemistry      Component Value Date/Time   NA 140 02/22/2017 0916   K 3.8 02/22/2017 0916   CL 107 02/22/2017 0916   CO2 26 02/22/2017 0916   BUN 14 02/22/2017 0916   CREATININE 0.87 02/22/2017 0916   CREATININE 0.86 07/18/2016 1108      Component Value Date/Time   CALCIUM 9.1 02/22/2017 0916   ALKPHOS 35 (L) 02/22/2017 0916   AST 32 02/22/2017 0916   ALT 22 02/22/2017 0916   BILITOT 0.7 02/22/2017 0916     Lab Results  Component Value Date   INR 2.00 02/22/2017   INR 2.10 02/05/2017   INR 2.02 01/03/2017   PROTIME 25.0 10/22/2014     PENDING LABS:   RADIOGRAPHIC STUDIES:  No results found.   PATHOLOGY:    ASSESSMENT AND PLAN:  1. Stage IIIA ([id number]) adenocarcinoma the left  breast status post modified radical mastectomy in 02/14/2011 followed by 6 cycles of epirubicin and Cytoxan with significant dose reductions followed by radiation therapy. She was then placed on Arimidex. She'll take that for 5 years. This was a grade 3 cancer. She had multifocal invasive disease. The largest focus was 1.5 cm. She did have involving the deep surgical margin of resection. She had 6 of 7 positive nodes involved and axilla. ER +100%, PR +14%, Ki-67 are 45%, HER-2/neu nonamplified. She started Arimidex on 11/19/2011. Thus far she has no evidence recurrent disease with a negative PET scan in April 2014.  -Clinically NED on breast exam today. -Next mammogram of the right breast due in December 2018. -Patient completed 5 years of aromatase inhibitor in April 2018, however patient wishes to continue taking at this time.  2. Osteopenia in the setting of anti-estrogen therapy.   -Continue calcium and Vit D with the addition of Prolia every 6 months. -Repeat DEXA scan in December 2018.  3. VTE x 2.  R LE DVT in 2012 and B/L PE R>L in 2014.   -On lifelong anticoagulation with  Coumadin. -Maintain INR 2-3.  Return to clinic for follow-up in 6 months with labs.  ORDERS PLACED FOR THIS ENCOUNTER: Orders Placed This Encounter  Procedures  . CBC with Differential  . Comprehensive metabolic panel    MEDICATIONS PRESCRIBED THIS ENCOUNTER: Meds ordered this encounter  Medications  . vitamin E 1000 UNIT capsule    Sig: Take 1,000 Units by mouth daily.  . calcium-vitamin D (OSCAL WITH D) 500-200 MG-UNIT tablet    Sig: Take 1 tablet by mouth 2 (two) times daily.  . cholecalciferol (VITAMIN D) 1000 units tablet    Sig: Take 1,000 Units by mouth daily.    THERAPY PLAN:  NCCN guidelines recommends the following surveillance for invasive breast cancer (2.2017):  A. History and Physical exam 1-4 times per year as clinically appropriate for 5 years, then annually.  B. Periodic screening for changes in family history and referral to genetics counseling as indicated  C. Educate, monitor, and refer to lymphedema management.  D. Mammography every 12 months  E. Routine imaging of reconstructed breast is not indicated.  F. In the absence of clinical signs and symptoms suggestive of recurrent disease, there is no indication for laboratory or imaging studies for metastases screening.  G. Women on Tamoxifen: annual gynecologic assessment every 12 months if uterus is present.  H. Women on aromatase inhibitor or who experience ovarian failure secondary to treatment should have monitoring of bone health with a bone mineral density determination at baseline and periodically thereafter.  I. Assess and encourage adherence to adjuvant endocrine therapy.  J. Evidence suggests that active lifestyle, healthy diet, limited alcohol intake, and achieving and maintaining an ideal body weight (20-25 BMI) may lead to optimal breast cancer outcomes.  All questions were answered. The patient knows to call the clinic with any problems, questions or concerns. We can certainly see the patient much  sooner if necessary.   This note is electronically signed by: Twana First, MD 02/22/2017 12:46 PM

## 2017-03-26 ENCOUNTER — Encounter (HOSPITAL_COMMUNITY): Payer: Medicare HMO | Attending: Oncology

## 2017-03-26 ENCOUNTER — Telehealth (HOSPITAL_COMMUNITY): Payer: Self-pay

## 2017-03-26 DIAGNOSIS — Z7901 Long term (current) use of anticoagulants: Secondary | ICD-10-CM | POA: Diagnosis not present

## 2017-03-26 LAB — PROTIME-INR
INR: 3.06
PROTHROMBIN TIME: 32.3 s — AB (ref 11.4–15.2)

## 2017-03-26 MED ORDER — WARFARIN SODIUM 5 MG PO TABS: ORAL_TABLET | ORAL | 5 refills | Status: DC

## 2017-03-26 NOTE — Telephone Encounter (Signed)
Received refill request via inbasket for Warfarin. Reviewed with provider, chart checked and refilled.

## 2017-03-26 NOTE — Telephone Encounter (Signed)
-----   Message from Epifanio Lesches sent at 03/26/2017 11:24 AM EDT ----- Refill on warfarin

## 2017-04-18 ENCOUNTER — Ambulatory Visit (INDEPENDENT_AMBULATORY_CARE_PROVIDER_SITE_OTHER): Payer: Medicare HMO | Admitting: Family Medicine

## 2017-04-18 ENCOUNTER — Encounter: Payer: Self-pay | Admitting: Family Medicine

## 2017-04-18 VITALS — BP 120/70 | HR 84 | Temp 97.1°F | Resp 16 | Ht 65.0 in | Wt 189.0 lb

## 2017-04-18 DIAGNOSIS — Z23 Encounter for immunization: Secondary | ICD-10-CM | POA: Diagnosis not present

## 2017-04-18 DIAGNOSIS — B369 Superficial mycosis, unspecified: Secondary | ICD-10-CM | POA: Diagnosis not present

## 2017-04-18 DIAGNOSIS — E7849 Other hyperlipidemia: Secondary | ICD-10-CM

## 2017-04-18 DIAGNOSIS — E784 Other hyperlipidemia: Secondary | ICD-10-CM

## 2017-04-18 DIAGNOSIS — I10 Essential (primary) hypertension: Secondary | ICD-10-CM

## 2017-04-18 DIAGNOSIS — E039 Hypothyroidism, unspecified: Secondary | ICD-10-CM | POA: Diagnosis not present

## 2017-04-18 MED ORDER — CLOTRIMAZOLE-BETAMETHASONE 1-0.05 % EX CREA: TOPICAL_CREAM | CUTANEOUS | 0 refills | Status: DC

## 2017-04-18 NOTE — Assessment & Plan Note (Signed)
Updated lab needed.  

## 2017-04-18 NOTE — Assessment & Plan Note (Signed)
Rash on right knee x 4 months intermittent , some response to topical steroid

## 2017-04-18 NOTE — Addendum Note (Signed)
Addended by: Merceda Elks on: 04/18/2017 01:38 PM   Modules accepted: Orders

## 2017-04-18 NOTE — Assessment & Plan Note (Signed)
Hyperlipidemia:Low fat diet discussed and encouraged.   Lipid Panel  Lab Results  Component Value Date   CHOL 146 07/18/2016   HDL 61 07/18/2016   LDLCALC 72 07/18/2016   TRIG 66 07/18/2016   CHOLHDL 2.4 07/18/2016   Updated lab needed

## 2017-04-18 NOTE — Assessment & Plan Note (Signed)
Controlled, no change in medication DASH diet and commitment to daily physical activity for a minimum of 30 minutes discussed and encouraged, as a part of hypertension management. The importance of attaining a healthy weight is also discussed.  BP/Weight 04/18/2017 02/22/2017 10/04/2016 08/24/2016 07/25/2016 02/23/2016 03/23/2992  Systolic BP 716 967 893 810 175 102 585  Diastolic BP 70 67 80 70 80 65 72  Wt. (Lbs) 189 194.8 195 191 191.08 188.2 192.12  BMI 31.45 32.42 32.45 31.78 31.8 31.32 31.97

## 2017-04-18 NOTE — Patient Instructions (Addendum)
Wellness visit with nurse before end of 2018, due  Physical exam with MD middle of March  Fasting lipid and TSH next week at the hosptal with other labs please  Flu vaccine today  Cream twice daily for 10 days to rash on right kneee then as needed sent to your pharmacy [place]  Please be careful not to fall, keep that attractive cane in your hand at all times  Thank you  for choosing [facility]. We consider it a privelige to serve you.  Delivering excellent health care in a caring and  compassionate way is our goal.  Partnering with you,  so that together we can achieve this goal is our strategy.

## 2017-04-18 NOTE — Progress Notes (Signed)
   Lisa Klein     MRN: [id number]      DOB: [birth year]   HPI Lisa Klein is here for follow up and re-evaluation of chronic medical conditions, medication management and review of any available recent lab and radiology data.  Preventive health is updated, specifically  Cancer screening and Immunization.   Questions or concerns regarding consultations or procedures which the PT has had in the interim are  addressed. The PT denies any adverse reactions to current medications since the last visit.  Itchy rash on right inner knee x 4 months , sonme but incomplete response to steroid cream  ROS Denies recent fever or chills. Denies sinus pressure, nasal congestion, ear pain or sore throat. Denies chest congestion, productive cough or wheezing. Denies chest pains, palpitations and leg swelling Denies abdominal pain, nausea, vomiting,diarrhea or constipation.   Denies dysuria, frequency, hesitancy or incontinence. Denies uncontrolled  joint pain,  And does have  limitation in mobility. Denies headaches, seizures, numbness, or tingling. Denies depression, anxiety or insomnia.    PE  BP 120/70 (BP Location: Left Arm, Patient Position: Sitting, Cuff Size: Normal)   Pulse 84   Temp (!) 97.1 F (36.2 C) (Other (Comment))   Resp 16   Ht 5\' 5"  (1.651 m)   Wt 189 lb (85.7 kg)   SpO2 99%   BMI 31.45 kg/m    BP 120/70 (BP Location: Left Arm, Patient Position: Sitting, Cuff Size: Normal)   Pulse 84   Temp (!) 97.1 F (36.2 C) (Other (Comment))   Resp 16   Ht 5\' 5"  (1.651 m)   Wt 189 lb (85.7 kg)   SpO2 99%   BMI 31.45 kg/m   Patient alert and oriented and in no cardiopulmonary distress.  HEENT: No facial asymmetry, EOMI,   oropharynx pink and moist.  Neck supple no JVD, no mass.  Chest: Clear to auscultation bilaterally.  CVS: S1, S2 no murmurs, no S3.Regular rate.  ABD: Soft non tender.   Ext: No edema  MS: decreased  ROM spine, shoulders, hips and knees.  Skin:  Intact, hyperpigmented macular rash noted.on medical aspect right knee  Psych: Good eye contact, normal affect. Memory intact not anxious or depressed appearing.  CNS: CN 2-12 intact, power,  normal throughout.no focal deficits noted.   Assessment & Plan  Dermatomycosis Rash on right knee x 4 months intermittent , some response to topical steroid  Essential hypertension Controlled, no change in medication DASH diet and commitment to daily physical activity for a minimum of 30 minutes discussed and encouraged, as a part of hypertension management. The importance of attaining a healthy weight is also discussed.  BP/Weight 04/18/2017 02/22/2017 10/04/2016 08/24/2016 07/25/2016 02/23/2016 01/17/4314  Systolic BP 400 867 619 509 326 712 458  Diastolic BP 70 67 80 70 80 65 72  Wt. (Lbs) 189 194.8 195 191 191.08 188.2 192.12  BMI 31.45 32.42 32.45 31.78 31.8 31.32 31.97       Hypothyroidism Updated lab needed   Hyperlipemia Hyperlipidemia:Low fat diet discussed and encouraged.   Lipid Panel  Lab Results  Component Value Date   CHOL 146 07/18/2016   HDL 61 07/18/2016   LDLCALC 72 07/18/2016   TRIG 66 07/18/2016   CHOLHDL 2.4 07/18/2016   Updated lab needed

## 2017-04-23 ENCOUNTER — Encounter (HOSPITAL_COMMUNITY): Payer: Medicare HMO | Attending: Oncology

## 2017-04-23 ENCOUNTER — Other Ambulatory Visit (HOSPITAL_COMMUNITY)
Admission: RE | Admit: 2017-04-23 | Discharge: 2017-04-23 | Disposition: A | Discharge status: Home / Self Care | Payer: Medicare HMO | Source: Ambulatory Visit | Attending: Family Medicine | Admitting: Family Medicine

## 2017-04-23 DIAGNOSIS — Z7901 Long term (current) use of anticoagulants: Secondary | ICD-10-CM | POA: Insufficient documentation

## 2017-04-23 DIAGNOSIS — E039 Hypothyroidism, unspecified: Secondary | ICD-10-CM | POA: Diagnosis not present

## 2017-04-23 DIAGNOSIS — I10 Essential (primary) hypertension: Secondary | ICD-10-CM | POA: Insufficient documentation

## 2017-04-23 DIAGNOSIS — E785 Hyperlipidemia, unspecified: Secondary | ICD-10-CM | POA: Diagnosis not present

## 2017-04-23 LAB — LIPID PANEL
CHOL/HDL RATIO: 2.6 ratio
Cholesterol: 151 mg/dL (ref 0–200)
HDL: 57 mg/dL (ref >40)
LDL Cholesterol: 82 mg/dL (ref 0–99)
Triglycerides: 62 mg/dL (ref <150)
VLDL: 12 mg/dL (ref 0–40)

## 2017-04-23 LAB — PROTIME-INR
INR: 2.49
PROTHROMBIN TIME: 26.7 s — AB (ref 11.4–15.2)

## 2017-04-23 LAB — TSH: TSH: 1.64 u[IU]/mL (ref 0.350–4.500)

## 2017-05-22 ENCOUNTER — Encounter (HOSPITAL_COMMUNITY): Payer: Medicare HMO | Attending: Oncology

## 2017-05-22 DIAGNOSIS — Z7901 Long term (current) use of anticoagulants: Secondary | ICD-10-CM | POA: Diagnosis not present

## 2017-05-22 LAB — PROTIME-INR
INR: 2.07
PROTHROMBIN TIME: 23.1 s — AB (ref 11.4–15.2)

## 2017-05-29 ENCOUNTER — Ambulatory Visit (INDEPENDENT_AMBULATORY_CARE_PROVIDER_SITE_OTHER): Payer: Medicare HMO

## 2017-05-29 VITALS — BP 126/74 | HR 80 | Temp 98.5°F | Ht 65.0 in | Wt 192.0 lb

## 2017-05-29 DIAGNOSIS — Z0000 Encounter for general adult medical examination without abnormal findings: Secondary | ICD-10-CM

## 2017-05-29 NOTE — Patient Instructions (Addendum)
Lisa Klein , Thank you for taking time to come for your Medicare Wellness Visit. I appreciate your ongoing commitment to your health goals. Please review the following plan we discussed and let me know if I can assist you in the future.   Screening recommendations/referrals: Colonoscopy: No longer required Mammogram: No longer required Bone Density: Up to date Recommended yearly ophthalmology/optometry visit for glaucoma screening and checkup Recommended yearly dental visit for hygiene and checkup  Vaccinations: Influenza vaccine: Up to date Pneumococcal vaccine: Up to date Tdap vaccine: Up to date, next due 11/2020 Shingles vaccine: Completed    Advanced directives: Advance directive discussed with you today. I have provided a copy for you to complete at home and have notarized. Once this is complete please bring a copy in to our office so we can scan it into your chart.  Conditions/risks identified: Pre-obese, recommend starting a routine exercise program at least 3 days a week for 30-45 minutes at a time as tolerated.   Next appointment: Follow up with Dr. Moshe Cipro on 10/30/2017 at 10:20 am. Follow up in 1 year for your annual wellness visit.  Preventive Care 24 Years and Older, Female Preventive care refers to lifestyle choices and visits with your health care provider that can promote health and wellness. What does preventive care include?  A yearly physical exam. This is also called an annual well check.  Dental exams once or twice a year.  Routine eye exams. Ask your health care provider how often you should have your eyes checked.  Personal lifestyle choices, including:  Daily care of your teeth and gums.  Regular physical activity.  Eating a healthy diet.  Avoiding tobacco and drug use.  Limiting alcohol use.  Practicing safe sex.  Taking low-dose aspirin every day.  Taking vitamin and mineral supplements as recommended by your health care provider. What  happens during an annual well check? The services and screenings done by your health care provider during your annual well check will depend on your age, overall health, lifestyle risk factors, and family history of disease. Counseling  Your health care provider may ask you questions about your:  Alcohol use.  Tobacco use.  Drug use.  Emotional well-being.  Home and relationship well-being.  Sexual activity.  Eating habits.  History of falls.  Memory and ability to understand (cognition).  Work and work Statistician.  Reproductive health. Screening  You may have the following tests or measurements:  Height, weight, and BMI.  Blood pressure.  Lipid and cholesterol levels. These may be checked every 5 years, or more frequently if you are over 47 years old.  Skin check.  Lung cancer screening. You may have this screening every year starting at age 51 if you have a 30-pack-year history of smoking and currently smoke or have quit within the past 15 years.  Fecal occult blood test (FOBT) of the stool. You may have this test every year starting at age 26.  Flexible sigmoidoscopy or colonoscopy. You may have a sigmoidoscopy every 5 years or a colonoscopy every 10 years starting at age 27.  Hepatitis C blood test.  Hepatitis B blood test.  Sexually transmitted disease (STD) testing.  Diabetes screening. This is done by checking your blood sugar (glucose) after you have not eaten for a while (fasting). You may have this done every 1-3 years.  Bone density scan. This is done to screen for osteoporosis. You may have this done starting at age 62.  Mammogram. This may be  done every 1-2 years. Talk to your health care provider about how often you should have regular mammograms. Talk with your health care provider about your test results, treatment options, and if necessary, the need for more tests. Vaccines  Your health care provider may recommend certain vaccines, such  as:  Influenza vaccine. This is recommended every year.  Tetanus, diphtheria, and acellular pertussis (Tdap, Td) vaccine. You may need a Td booster every 10 years.  Zoster vaccine. You may need this after age 37.  Pneumococcal 13-valent conjugate (PCV13) vaccine. One dose is recommended after age 9.  Pneumococcal polysaccharide (PPSV23) vaccine. One dose is recommended after age 60. Talk to your health care provider about which screenings and vaccines you need and how often you need them. This information is not intended to replace advice given to you by your health care provider. Make sure you discuss any questions you have with your health care provider. Document Released: 08/19/2015 Document Revised: 04/11/2016 Document Reviewed: 05/24/2015 Elsevier Interactive Patient Education  2017 [place] Prevention in the Home Falls can cause injuries. They can happen to people of all ages. There are many things you can do to make your home safe and to help prevent falls. What can I do on the outside of my home?  Regularly fix the edges of walkways and driveways and fix any cracks.  Remove anything that might make you trip as you walk through a door, such as a raised step or threshold.  Trim any bushes or trees on the path to your home.  Use bright outdoor lighting.  Clear any walking paths of anything that might make someone trip, such as rocks or tools.  Regularly check to see if handrails are loose or broken. Make sure that both sides of any steps have handrails.  Any raised decks and porches should have guardrails on the edges.  Have any leaves, snow, or ice cleared regularly.  Use sand or salt on walking paths during winter.  Clean up any spills in your garage right away. This includes oil or grease spills. What can I do in the bathroom?  Use night lights.  Install grab bars by the toilet and in the tub and shower. Do not use towel bars as grab bars.  Use  non-skid mats or decals in the tub or shower.  If you need to sit down in the shower, use a plastic, non-slip stool.  Keep the floor dry. Clean up any water that spills on the floor as soon as it happens.  Remove soap buildup in the tub or shower regularly.  Attach bath mats securely with double-sided non-slip rug tape.  Do not have throw rugs and other things on the floor that can make you trip. What can I do in the bedroom?  Use night lights.  Make sure that you have a light by your bed that is easy to reach.  Do not use any sheets or blankets that are too big for your bed. They should not hang down onto the floor.  Have a firm chair that has side arms. You can use this for support while you get dressed.  Do not have throw rugs and other things on the floor that can make you trip. What can I do in the kitchen?  Clean up any spills right away.  Avoid walking on wet floors.  Keep items that you use a lot in easy-to-reach places.  If you need to reach something above you, use  a strong step stool that has a grab bar.  Keep electrical cords out of the way.  Do not use floor polish or wax that makes floors slippery. If you must use wax, use non-skid floor wax.  Do not have throw rugs and other things on the floor that can make you trip. What can I do with my stairs?  Do not leave any items on the stairs.  Make sure that there are handrails on both sides of the stairs and use them. Fix handrails that are broken or loose. Make sure that handrails are as long as the stairways.  Check any carpeting to make sure that it is firmly attached to the stairs. Fix any carpet that is loose or worn.  Avoid having throw rugs at the top or bottom of the stairs. If you do have throw rugs, attach them to the floor with carpet tape.  Make sure that you have a light switch at the top of the stairs and the bottom of the stairs. If you do not have them, ask someone to add them for you. What  else can I do to help prevent falls?  Wear shoes that:  Do not have high heels.  Have rubber bottoms.  Are comfortable and fit you well.  Are closed at the toe. Do not wear sandals.  If you use a stepladder:  Make sure that it is fully opened. Do not climb a closed stepladder.  Make sure that both sides of the stepladder are locked into place.  Ask someone to hold it for you, if possible.  Clearly mark and make sure that you can see:  Any grab bars or handrails.  First and last steps.  Where the edge of each step is.  Use tools that help you move around (mobility aids) if they are needed. These include:  Canes.  Walkers.  Scooters.  Crutches.  Turn on the lights when you go into a dark area. Replace any light bulbs as soon as they burn out.  Set up your furniture so you have a clear path. Avoid moving your furniture around.  If any of your floors are uneven, fix them.  If there are any pets around you, be aware of where they are.  Review your medicines with your doctor. Some medicines can make you feel dizzy. This can increase your chance of falling. Ask your doctor what other things that you can do to help prevent falls. This information is not intended to replace advice given to you by your health care provider. Make sure you discuss any questions you have with your health care provider. Document Released: 05/19/2009 Document Revised: 12/29/2015 Document Reviewed: 08/27/2014 Elsevier Interactive Patient Education  2017 [employer].

## 2017-05-29 NOTE — Progress Notes (Signed)
Subjective:   Lisa Klein is a 83 y.o. female who presents for Medicare Annual (Subsequent) preventive examination.  Review of Systems: N/A Cardiac Risk Factors include: advanced age (>8men, >58 women);dyslipidemia;hypertension;sedentary lifestyle;obesity (BMI >30kg/m2)     Objective:     Vitals: BP 126/74   Pulse 80   Temp 98.5 F (36.9 C) (Temporal)   Ht 5\' 5"  (1.651 m)   Wt 192 lb 0.6 oz (87.1 kg)   BMI 31.96 kg/m   Body mass index is 31.96 kg/m.   Tobacco History  Smoking Status  . Never Smoker  Smokeless Tobacco  . Never Used     Counseling given: Not Answered   Past Medical History:  Diagnosis Date  . Cancer ([place]) 2012   LEFT BREAST  . Clotting disorder ([place]) 05/2011   dvt, right leg  . Degenerative disc disease    with nerve compression   . DJD (degenerative joint disease)   . Hypertension   . Hypertension   . Hypothyroidism   . Hypothyroidism   . Long term current use of anticoagulant therapy 12/07/2014   Pulmonary embolus   . Obesity   . Osteopenia 08/24/2014   Past Surgical History:  Procedure Laterality Date  . ABDOMINAL HYSTERECTOMY  1980   fibroids  . BREAST SURGERY  2012   left total mastectomy  . CHOLECYSTECTOMY  80'S   [facility]  . CHOLECYSTECTOMY    . HIP PINNING,CANNULATED Left 01/19/2013   Procedure: CANNULATED HIP PINNING-  left;  Surgeon: Marin Shutter, MD;  Location: [facility];  Service: Orthopedics;  Laterality: Left;  [name] MASTECTOMY MODIFIED RADICAL  02/14/11   left  . PORT-A-CATH REMOVAL N/A 12/05/2012   Procedure: MINOR REMOVAL PORT-A-CATH;  Surgeon: Jamesetta So, MD;  [facility];  Service: General;  Laterality: N/A;  In Minor Room  . PORTACATH PLACEMENT  04/16/2011   Procedure: INSERTION PORT-A-CATH;  Surgeon: Jamesetta So;  Location[facility];  Service: General;  Laterality: Right;  right subclavian  . VESICOVAGINAL FISTULA CLOSURE W/ TAH     [facility]   Family History  Problem Relation Age of Onset  . COPD Father   . Lung  disease Father   . Hypertension Sister   . Arthritis Mother   . Hypertension Sister   . Heart disease Son        stent  . Anesthesia problems Neg Hx   . Malignant hyperthermia Neg Hx   . Hypotension Neg Hx   . Pseudochol deficiency Neg Hx    History  Sexual Activity  . Sexual activity: No    Outpatient Encounter Prescriptions as of 05/29/2017  Medication Sig  . acetaminophen (TYLENOL) 650 MG CR tablet Take 650 mg by mouth 2 (two) times daily.   [name] anastrozole (ARIMIDEX) 1 MG tablet TAKE 1 TABLET AT BEDTIME  . aspirin EC 81 MG tablet Take 81 mg by mouth daily.  . calcium-vitamin D (OSCAL WITH D) 500-200 MG-UNIT tablet Take 1 tablet by mouth 2 (two) times daily.  . clotrimazole-betamethasone (LOTRISONE) cream Apply twice daily to rash on right knee  For 10 days ,then , as needed  . Cod Liver Oil 1000 MG CAPS Take 1 capsule by mouth daily.  [name] denosumab (PROLIA) 60 MG/ML SOLN injection Inject 60 mg into the skin every 6 (six) months. Administer in upper arm, thigh, or abdomen  . docusate sodium (COLACE) 100 MG capsule Take 100 mg by mouth daily as needed for constipation. For constipation  .  hydrochlorothiazide (MICROZIDE) 12.5 MG capsule TAKE 1 CAPSULE EVERY MORNING  . levothyroxine (SYNTHROID, LEVOTHROID) 100 MCG tablet TAKE 1 TABLET EVERY MORNING  . lovastatin (MEVACOR) 20 MG tablet TAKE 1 TABLET AT BEDTIME  . potassium chloride (K-DUR) 10 MEQ tablet TAKE 1 TABLET TWICE DAILY  . pyridOXINE (VITAMIN B-6) 100 MG tablet Take 100 mg by mouth daily.  . vitamin E 1000 UNIT capsule Take 1,000 Units by mouth daily.  [name] warfarin (COUMADIN) 5 MG tablet TAKE 1 TO 1 AND 1/2 TABLETS ONE TIME DAILY AT 6 PM (TAKE 1 TABLET ALTERNATING WITH TAKE 1 AND 1/2 TABLETS EVERY DAY)  . [DISCONTINUED] cholecalciferol (VITAMIN D) 1000 units tablet Take 1,000 Units by mouth daily.   Facility-Administered Encounter Medications as of 05/29/2017  Medication  . heparin lock flush 100 unit/mL  . heparin lock flush  100 unit/mL  . sodium chloride 0.9 % injection 10 mL  . sodium chloride 0.9 % injection 10 mL    Activities of Daily Living In your present state of health, do you have any difficulty performing the following activities: 05/29/2017 07/25/2016  Hearing? Y N  Comment patient prefers to have a free hearing screen at [facility] and will schedule an appointment for herself -  Vision? N N  Difficulty concentrating or making decisions? Tempie Donning  Walking or climbing stairs? Y Y  Dressing or bathing? N N  Doing errands, shopping? N N  Preparing Food and eating ? N N  Using the Toilet? N N  In the past six months, have you accidently leaked urine? Y N  Do you have problems with loss of bowel control? N N  Managing your Medications? N N  Managing your Finances? N N  Housekeeping or managing your Housekeeping? N N  Some recent data might be hidden    Patient Care Team: [name], MD as PCP - General Twana First, MD as Consulting Physician (Oncology) Baird Cancer, PA-C as Physician Assistant (Oncology)    Assessment:    Exercise Activities and Dietary recommendations Current Exercise Habits: Home exercise routine, Type of exercise: stretching, Frequency (Times/Week): 5, Intensity: Mild, Exercise limited by: None identified  Goals    . Exercise 3x per week (30 min per time)          Starting today 07/25/2016 patient would like to increase exercise to at least 30 minutes 3 times a week.       Fall Risk Fall Risk  05/29/2017 04/18/2017 07/25/2016 09/01/2015 08/24/2014  Falls in the past year? No No No No No  Number falls in past yr: - - - - -  Risk Factor Category  - - - - -  Risk for fall due to : - - Impaired vision;Impaired balance/gait Impaired mobility -   Depression Screen PHQ 2/9 Scores 05/29/2017 04/18/2017 07/25/2016 09/01/2015  PHQ - 2 Score 1 0 0 0     Cognitive Function: Normal   6CIT Screen 05/29/2017 07/25/2016  What Year? 0 points 0 points  What month? 0  points 0 points  What time? 0 points 0 points  Count back from 20 0 points 0 points  Months in reverse 0 points 0 points  Repeat phrase 0 points 0 points  Total Score 0 0    Immunization History  Administered Date(s) Administered  . Influenza Whole 06/06/2009, 05/17/2010  . Influenza,inj,Quad PF,6+ Mos 06/29/2013, 07/15/2014, 05/02/2015, 07/25/2016, 04/18/2017  . Pneumococcal Conjugate-13 03/03/2014  . Pneumococcal Polysaccharide-23 08/07/2001  . Td 02/29/2004  .  Tdap 11/13/2010  . Zoster 10/07/2006   Screening Tests Health Maintenance  Topic Date Due  . TETANUS/TDAP  11/12/2020  . INFLUENZA VACCINE  Completed  . DEXA SCAN  Completed  . PNA vac Low Risk Adult  Completed      Plan:   I have personally reviewed and noted the following in the patient's chart:   . Medical and social history . Use of alcohol, tobacco or illicit drugs  . Current medications and supplements . Functional ability and status . Nutritional status . Physical activity . Advanced directives . List of other physicians . Hospitalizations, surgeries, and ER visits in previous 12 months . Vitals . Screenings to include cognitive, depression, and falls . Referrals and appointments  In addition, I have reviewed and discussed with patient certain preventive protocols, quality metrics, and best practice recommendations. A written personalized care plan for preventive services as well as general preventive health recommendations were provided to patient.     Stormy Fabian, LPN  [id number]

## 2017-06-15 ENCOUNTER — Other Ambulatory Visit: Payer: Self-pay | Admitting: Nurse Practitioner

## 2017-06-17 ENCOUNTER — Other Ambulatory Visit: Payer: Self-pay | Admitting: Family Medicine

## 2017-06-19 ENCOUNTER — Other Ambulatory Visit: Payer: Self-pay | Admitting: Family Medicine

## 2017-06-19 ENCOUNTER — Encounter (HOSPITAL_COMMUNITY): Payer: Medicare HMO | Attending: Oncology

## 2017-06-19 DIAGNOSIS — Z7901 Long term (current) use of anticoagulants: Secondary | ICD-10-CM | POA: Insufficient documentation

## 2017-06-19 DIAGNOSIS — Z1231 Encounter for screening mammogram for malignant neoplasm of breast: Secondary | ICD-10-CM

## 2017-06-19 LAB — PROTIME-INR
INR: 2.35
Prothrombin Time: 25.5 seconds — ABNORMAL HIGH (ref 11.4–15.2)

## 2017-07-08 ENCOUNTER — Other Ambulatory Visit: Payer: Self-pay | Admitting: Family Medicine

## 2017-07-09 NOTE — Telephone Encounter (Signed)
Seen [phone] 

## 2017-07-15 ENCOUNTER — Ambulatory Visit (HOSPITAL_COMMUNITY): Payer: Medicare HMO

## 2017-07-15 ENCOUNTER — Ambulatory Visit (HOSPITAL_COMMUNITY): Payer: Commercial Managed Care - HMO

## 2017-07-19 ENCOUNTER — Encounter (HOSPITAL_COMMUNITY): Payer: Medicare HMO | Attending: Oncology

## 2017-07-19 ENCOUNTER — Ambulatory Visit (HOSPITAL_COMMUNITY)
Admission: RE | Admit: 2017-07-19 | Discharge: 2017-07-19 | Disposition: A | Discharge status: Home / Self Care | Payer: Medicare HMO | Source: Ambulatory Visit | Attending: Oncology | Admitting: Oncology

## 2017-07-19 ENCOUNTER — Encounter (HOSPITAL_COMMUNITY): Payer: Self-pay

## 2017-07-19 DIAGNOSIS — Z1231 Encounter for screening mammogram for malignant neoplasm of breast: Secondary | ICD-10-CM | POA: Insufficient documentation

## 2017-07-19 DIAGNOSIS — Z7901 Long term (current) use of anticoagulants: Secondary | ICD-10-CM

## 2017-07-19 DIAGNOSIS — C50912 Malignant neoplasm of unspecified site of left female breast: Secondary | ICD-10-CM

## 2017-07-19 LAB — PROTIME-INR
INR: 2.58
Prothrombin Time: 27.5 seconds — ABNORMAL HIGH (ref 11.4–15.2)

## 2017-08-21 ENCOUNTER — Encounter (HOSPITAL_COMMUNITY): Payer: Self-pay | Admitting: Internal Medicine

## 2017-08-21 ENCOUNTER — Other Ambulatory Visit: Payer: Self-pay

## 2017-08-21 ENCOUNTER — Inpatient Hospital Stay (HOSPITAL_COMMUNITY): Payer: Medicare HMO

## 2017-08-21 ENCOUNTER — Inpatient Hospital Stay (HOSPITAL_COMMUNITY): Payer: Medicare HMO | Attending: Oncology

## 2017-08-21 ENCOUNTER — Inpatient Hospital Stay (HOSPITAL_BASED_OUTPATIENT_CLINIC_OR_DEPARTMENT_OTHER): Payer: Medicare HMO | Admitting: Internal Medicine

## 2017-08-21 ENCOUNTER — Other Ambulatory Visit (HOSPITAL_COMMUNITY): Payer: Self-pay | Admitting: Adult Health

## 2017-08-21 VITALS — BP 141/71 | HR 75 | Temp 97.7°F | Resp 18 | Wt 186.8 lb

## 2017-08-21 DIAGNOSIS — C50912 Malignant neoplasm of unspecified site of left female breast: Secondary | ICD-10-CM | POA: Diagnosis not present

## 2017-08-21 DIAGNOSIS — D72819 Decreased white blood cell count, unspecified: Secondary | ICD-10-CM

## 2017-08-21 DIAGNOSIS — Z923 Personal history of irradiation: Secondary | ICD-10-CM | POA: Diagnosis not present

## 2017-08-21 DIAGNOSIS — Z7901 Long term (current) use of anticoagulants: Secondary | ICD-10-CM | POA: Diagnosis not present

## 2017-08-21 DIAGNOSIS — Z86718 Personal history of other venous thrombosis and embolism: Secondary | ICD-10-CM | POA: Insufficient documentation

## 2017-08-21 DIAGNOSIS — Z9012 Acquired absence of left breast and nipple: Secondary | ICD-10-CM | POA: Diagnosis not present

## 2017-08-21 DIAGNOSIS — C7951 Secondary malignant neoplasm of bone: Secondary | ICD-10-CM | POA: Diagnosis not present

## 2017-08-21 DIAGNOSIS — Z170 Estrogen receptor positive status [ER+]: Secondary | ICD-10-CM | POA: Insufficient documentation

## 2017-08-21 DIAGNOSIS — Z95828 Presence of other vascular implants and grafts: Secondary | ICD-10-CM

## 2017-08-21 DIAGNOSIS — M8580 Other specified disorders of bone density and structure, unspecified site: Secondary | ICD-10-CM

## 2017-08-21 LAB — COMPREHENSIVE METABOLIC PANEL
ALBUMIN: 3.6 g/dL (ref 3.5–5.0)
ALT: 21 U/L (ref 14–54)
AST: 26 U/L (ref 15–41)
Alkaline Phosphatase: 39 U/L (ref 38–126)
Anion gap: 9 (ref 5–15)
BUN: 13 mg/dL (ref 6–20)
CHLORIDE: 103 mmol/L (ref 101–111)
CO2: 28 mmol/L (ref 22–32)
CREATININE: 0.85 mg/dL (ref 0.44–1.00)
Calcium: 9.2 mg/dL (ref 8.9–10.3)
GFR calc non Af Amer: >60 mL/min (ref >60)
GLUCOSE: 82 mg/dL (ref 65–99)
Potassium: 3.7 mmol/L (ref 3.5–5.1)
SODIUM: 140 mmol/L (ref 135–145)
Total Bilirubin: 0.6 mg/dL (ref 0.3–1.2)
Total Protein: 7.2 g/dL (ref 6.5–8.1)

## 2017-08-21 LAB — CBC WITH DIFFERENTIAL/PLATELET
BASOS ABS: 0.1 10*3/uL (ref 0.0–0.1)
BASOS PCT: 1 %
EOS ABS: 0.1 10*3/uL (ref 0.0–0.7)
Eosinophils Relative: 2 %
HCT: 40.3 % (ref 36.0–46.0)
Hemoglobin: 12.7 g/dL (ref 12.0–15.0)
Lymphocytes Relative: 37 %
Lymphs Abs: 1.4 10*3/uL (ref 0.7–4.0)
MCH: 28.4 pg (ref 26.0–34.0)
MCHC: 31.5 g/dL (ref 30.0–36.0)
MCV: 90.2 fL (ref 78.0–100.0)
Monocytes Absolute: 0.5 10*3/uL (ref 0.1–1.0)
Monocytes Relative: 13 %
Neutro Abs: 1.8 10*3/uL (ref 1.7–7.7)
Neutrophils Relative %: 47 %
Platelets: 162 10*3/uL (ref 150–400)
RBC: 4.47 MIL/uL (ref 3.87–5.11)
RDW: 15.8 % — ABNORMAL HIGH (ref 11.5–15.5)
WBC: 3.8 10*3/uL — AB (ref 4.0–10.5)

## 2017-08-21 LAB — PROTIME-INR
INR: 1.91
Prothrombin Time: 21.7 seconds — ABNORMAL HIGH (ref 11.4–15.2)

## 2017-08-21 MED ORDER — RIVAROXABAN 20 MG PO TABS: 20.0000 mg | ORAL_TABLET | Freq: Every day | ORAL | 1 refills | Status: DC

## 2017-08-21 MED ORDER — HEPARIN SOD (PORK) LOCK FLUSH 100 UNIT/ML IV SOLN
INTRAVENOUS | Status: AC
  Filled 2017-08-21: qty 5

## 2017-08-21 MED ORDER — HEPARIN SOD (PORK) LOCK FLUSH 100 UNIT/ML IV SOLN: 500.0000 [IU] | Freq: Once | INTRAVENOUS | Status: DC

## 2017-08-21 MED ORDER — DENOSUMAB 60 MG/ML ~~LOC~~ SOLN
60.0000 mg | Freq: Once | SUBCUTANEOUS | Status: AC
  Administered 2017-08-21: 60 mg via SUBCUTANEOUS
  Filled 2017-08-21: qty 1

## 2017-08-21 NOTE — Patient Instructions (Signed)
[facility] at [facility] Discharge Instructions  RECOMMENDATIONS MADE BY THE CONSULTANT AND ANY TEST RESULTS WILL BE SENT TO YOUR REFERRING PHYSICIAN.  You were seen today by Dr. Peru  Thank you for choosing  Cancer Center at [facility] to provide your oncology and hematology care.  To afford each patient quality time with our provider, please arrive at least 15 minutes before your scheduled appointment time.    If you have a lab appointment with the Cancer Center please come in thru the  Main Entrance and check in at the main information desk  You need to re-schedule your appointment should you arrive 10 or more minutes late.  We strive to give you quality time with our providers, and arriving late affects you and other patients whose appointments are after yours.  Also, if you no show three or more times for appointments you may be dismissed from the clinic at the providers discretion.     Again, thank you for choosing [facility].  Our hope is that these requests will decrease the amount of time that you wait before being seen by our physicians.       _____________________________________________________________  Should you have questions after your visit to [facility], please contact our office at [phone] between the hours of 8:30 a.m. and 4:30 p.m.  Voicemails left after 4:30 p.m. will not be returned until the following business day.  For prescription refill requests, have your pharmacy contact our office.       Resources For Cancer Patients and their Caregivers ? American Cancer Society: Can assist with transportation, wigs, general needs, runs Look Good Feel Better.        1-888-227-6333 ? Cancer Care: Provides financial assistance, online support groups, medication/co-pay assistance.  1-800-813-HOPE (4673) ? [facility] Assists [place] cancer patients and their families  through emotional , educational and financial support.  336-427-4357 ? [place] DSS Where to apply for food stamps, Medicaid and utility assistance. 336-342-1394 ? [facility]: Transportation to medical appointments. 336-347-2287 ? Social Security Administration: May apply for disability if have a Stage IV cancer. 336-342-7796 1-800-772-1213 ? [place] Aging, Disability and Transit Services: Assists with nutrition, care and transit needs. 336-349-2343  Cancer Center Support Programs: @10RELATIVEDAYS@ > Cancer Support Group  2nd Tuesday of the month 1pm-2pm, Journey Room  > Creative Journey  3rd Tuesday of the month 1130am-1pm, Journey Room  > Look Good Feel Better  1st Wednesday of the month 10am-12 noon, Journey Room (Call American Cancer Society to register 1-800-395-5775)     

## 2017-08-21 NOTE — Progress Notes (Signed)
Lisa Klein presents today for injection per MD orders. Prolia 60mg  administered SQ in left Abdomen. Administration without incident. Patient tolerated well.  Labs reviewed with patient. Patient signed updated consent for Prolia prior to administration. Copy of consent put in tx room. Original consent laid in scan basket at front desk. Prolia information given to patient for review @ home regarding side effects of Prolia and when to contact the doctor or dentist regarding potential side effects. Patient instructed to be sure that her dentist knows that she is ttaking Prolia. No dental extractions or upcoming dental work to be done. Patient instructed about the importance of adhering to her Calcium and Vit D regimen because of her being on Prolia. She verbalized understanding of all instructions. Follow up appts given and reviewed with patient prior to discharge. Patient discharged ambulatory and in stable condition.

## 2017-08-21 NOTE — Progress Notes (Deleted)
[name] presented for Portacath access and flush. Portacath located rt chest wall accessed with  H 20 needle. Good blood return present. Portacath flushed with 57ml NS and 500U/41ml Heparin and needle removed intact. Procedure without incident. Patient tolerated procedure well.

## 2017-08-21 NOTE — Progress Notes (Addendum)
[name], MD [address] [place] [address]  CC:  F/u for stage III A breast cancer AND H/o DVT  CURRENT THERAPY: Arimidex daily beginning on 11/19/2011 which she will take for 5 years.  Vitamin K antagonist anticoagulation.  HPI: Lisa Klein 84 y.o. female returns for followup of Stage IIIA (T1CN2AM0) adenocarcinoma the left breast status post modified radical mastectomy in 02/14/2011 followed by 6 cycles of epirubicin and Cytoxan with significant dose reductions followed by radiation therapy. She was then placed on Arimidex. She'll take that for 5 years. This was a grade 3 cancer. She had multifocal invasive disease. The largest focus was 1.5 cm. She did have involving the deep surgical margin of resection. She had 6 of 7 positive nodes involved and axilla. ER +100%, PR +14%, Ki-67 are 45%, HER-2/neu nonamplified. She started Arimidex on 11/19/2011. Her last PET scan was NED in April 2014.  AND Osteopenia in the setting of anti-estrogen therapy.  On Ca++ and Vit D with the addition of Prolia every 6 months. AND VTE x 2.  R LE DVT in 2012 and B/L PE R>L in 2014.  On lifelong anticoagulation with Coumadin.  Interval history: Ms. Wesche is doing well, no side effects from Arimidex, no hot flashes, no new bone pains, no fractures. She did not feel any new lumps in either breast or axillae. No weight loss, appetite is great, no reported bleeding, no leg swelling, or SOB or chest pain. Continues Coumadin daily.  Review of Systems  Constitutional: Negative.  Negative for chills, fever and weight loss.  HENT: Negative.   Eyes: Negative.   Respiratory: Negative.  Negative for cough.   Cardiovascular: Negative.  Negative for chest pain.  Gastrointestinal: Negative.  Negative for constipation, diarrhea, nausea and vomiting.  Genitourinary: Negative.   Musculoskeletal: Negative for joint pain and myalgias.  Skin: Negative.   Neurological: Negative.   Negative for weakness.  Endo/Heme/Allergies: Negative.   Psychiatric/Behavioral: Negative.   All other systems reviewed and are negative.   Past Medical History:  Diagnosis Date  . Cancer ([place]) 2012   LEFT BREAST  . Clotting disorder ([place]) 05/2011   dvt, right leg  . Degenerative disc disease    with nerve compression   . DJD (degenerative joint disease)   . Hypertension   . Hypertension   . Hypothyroidism   . Hypothyroidism   . Long term current use of anticoagulant therapy 12/07/2014   Pulmonary embolus   . Obesity   . Osteopenia 08/24/2014    Past Surgical History:  Procedure Laterality Date  . ABDOMINAL HYSTERECTOMY  1980   fibroids  . BREAST SURGERY  2012   left total mastectomy  . CHOLECYSTECTOMY  80'S   [facility]  . CHOLECYSTECTOMY    . HIP PINNING,CANNULATED Left 01/19/2013   Procedure: CANNULATED HIP PINNING-  left;  Surgeon: Marin Shutter, MD;  Location: [facility];  Service: Orthopedics;  Laterality: Left;  [name] MASTECTOMY MODIFIED RADICAL  02/14/11   left  . PORT-A-CATH REMOVAL N/A 12/05/2012   Procedure: MINOR REMOVAL PORT-A-CATH;  Surgeon: Jamesetta So, MD;  Location[facility];  Service: General;  Laterality: N/A;  In Minor Room  . PORTACATH PLACEMENT  04/16/2011   Procedure: INSERTION PORT-A-CATH;  Surgeon: Jamesetta So;  Location[facility];  Service: General;  Laterality: Right;  right subclavian  . VESICOVAGINAL FISTULA CLOSURE W/ TAH     [facility]  Family History  Problem Relation Age of Onset  . COPD Father   . Lung disease Father   . Hypertension Sister   . Arthritis Mother   . Hypertension Sister   . Heart disease Son        stent  . Anesthesia problems Neg Hx   . Malignant hyperthermia Neg Hx   . Hypotension Neg Hx   . Pseudochol deficiency Neg Hx     Social History   Socioeconomic History  . Marital status: Widowed    Spouse name: None  . Number of children: 2  . Years of education: None  . Highest education level: None  Social Needs  . Financial  resource strain: None  . Food insecurity - worry: None  . Food insecurity - inability: None  . Transportation needs - medical: None  . Transportation needs - non-medical: None  Occupational History  . Occupation: [employer] part time   Tobacco Use  . Smoking status: Never Smoker  . Smokeless tobacco: Never Used  Substance and Sexual Activity  . Alcohol use: No  . Drug use: No  . Sexual activity: No    Birth control/protection: Post-menopausal  Other Topics Concern  . None  Social History Narrative  . None     PHYSICAL EXAMINATION  ECOG PERFORMANCE STATUS: 1 - Symptomatic but completely ambulatory  Vitals:   08/21/17 1023  BP: (!) 141/71  Pulse: 75  Resp: 18  Temp: 97.7 F (36.5 C)  SpO2: 100%   Physical Exam  Constitutional: She is oriented to person, place, and time. She appears well-developed and well-nourished.  HENT:  Head: Normocephalic and atraumatic.  Eyes: Pupils are equal, round, and reactive to light.  Neck: Normal range of motion.  Cardiovascular: Normal rate and regular rhythm.  Pulmonary/Chest: Effort normal and breath sounds normal.  Abdominal: Soft.  Musculoskeletal: Normal range of motion. She exhibits no tenderness or deformity.  Lymphadenopathy:    She has no cervical adenopathy.  Neurological: She is oriented to person, place, and time. No cranial nerve deficit.  Skin: No rash noted. No pallor.  Psychiatric: She has a normal mood and affect. Judgment and thought content normal.   BREAST EXAM: Left chest wall without any subcutaneous nodules or axillary lymphadenopathy. Right breast is free of any masses, no skin changes,  no nipple discharge, no axillary lymphadenopathy bilaterally  LABORATORY DATA: CBC    Component Value Date/Time   WBC 3.8 (L) 08/21/2017 0920   RBC 4.47 08/21/2017 0920   HGB 12.7 08/21/2017 0920   HCT 40.3 08/21/2017 0920   PLT 162 08/21/2017 0920   MCV 90.2 08/21/2017 0920   MCH 28.4 08/21/2017 0920   MCHC 31.5 08/21/2017  0920   RDW 15.8 (H) 08/21/2017 0920   LYMPHSABS 1.4 08/21/2017 0920   MONOABS 0.5 08/21/2017 0920   EOSABS 0.1 08/21/2017 0920   BASOSABS 0.1 08/21/2017 0920      Chemistry      Component Value Date/Time   NA 140 08/21/2017 0920   K 3.7 08/21/2017 0920   CL 103 08/21/2017 0920   CO2 28 08/21/2017 0920   BUN 13 08/21/2017 0920   CREATININE 0.85 08/21/2017 0920   CREATININE 0.86 07/18/2016 1108      Component Value Date/Time   CALCIUM 9.2 08/21/2017 0920   ALKPHOS 39 08/21/2017 0920   AST 26 08/21/2017 0920   ALT 21 08/21/2017 0920   BILITOT 0.6 08/21/2017 0920     Lab Results  Component Value Date  INR 1.91 08/21/2017   INR 2.58 07/19/2017   INR 2.35 06/19/2017   PROTIME 25.0 10/22/2014      ASSESSMENT AND PLAN:  1. Stage IIIA (T1CN2AM0) adenocarcinoma the left breast status post modified radical mastectomy in 02/14/2011 followed by 6 cycles of epirubicin and Cytoxan with significant dose reductions followed by radiation therapy. She was then placed on Arimidex. This was a grade 3 cancer. She had multifocal invasive disease. The largest focus was 1.5 cm. She did have involving the deep surgical margin of resection. She had 6 of 7 positive nodes involved and axilla. ER +100%, PR +14%, Ki-67 are 45%, HER-2/neu nonamplified.  She started Arimidex on 11/19/2011. -Patient completed 5 years of aromatase inhibitor in April 2018, now on extended therapy. Clinically NED on breast exam today. -Mammogram of right breast from 12/14/ 2018 has been reviewed with her and is noted to be benign, needing annual follow up in 07/2018. .  2. Osteopenia in the setting of anti-estrogen therapy.   -Continue calcium and Vit D with the addition of Prolia every 6 months. Due for one dose today. - DEXA scan in December 2017 shows osteopenia. Next dexa due on 07/2018.   3. VTE x 2.  R LE DVT in 2012 and B/L PE R>L in 2014.   -On lifelong anticoagulation with Coumadin. She has considerable  difficulty with frequent visits for monitoring. I think she will do well with Xarelto, previously, she could not afford Xarelto to due to high co pay, but she is willing to try it again with a mail order pharmacy. I e scribed it, but told her that she should stop coumadin when she starts Xarelto.  -Maintain INR 2-3. 4. Chronic mild leukopenia- stable- continue to monitor  Return to clinic for follow-up in 6 months with labs.  ORDERS PLACED FOR THIS ENCOUNTER: No orders of the defined types were placed in this encounter.   MEDICATIONS PRESCRIBED THIS ENCOUNTER: Meds ordered this encounter  Medications  . rivaroxaban (XARELTO) 20 MG TABS tablet    Sig: Take 1 tablet (20 mg total) by mouth daily with supper.    Dispense:  30 tablet    Refill:  1   All questions were answered. The patient knows to call the clinic with any problems, questions or concerns. We can certainly see the patient much sooner if necessary.   This note is electronically signed by: Creola Corn, MD 08/21/2017 6:25 PM

## 2017-08-23 ENCOUNTER — Ambulatory Visit (HOSPITAL_COMMUNITY): Payer: Medicare HMO | Admitting: Adult Health

## 2017-08-23 ENCOUNTER — Other Ambulatory Visit (HOSPITAL_COMMUNITY): Payer: Medicare HMO

## 2017-08-23 ENCOUNTER — Ambulatory Visit (HOSPITAL_COMMUNITY): Payer: Medicare HMO

## 2017-08-23 ENCOUNTER — Other Ambulatory Visit (HOSPITAL_COMMUNITY): Payer: Self-pay | Admitting: Oncology

## 2017-08-23 DIAGNOSIS — C50912 Malignant neoplasm of unspecified site of left female breast: Secondary | ICD-10-CM

## 2017-08-30 ENCOUNTER — Other Ambulatory Visit (HOSPITAL_COMMUNITY): Payer: Self-pay | Admitting: *Deleted

## 2017-08-30 DIAGNOSIS — C50919 Malignant neoplasm of unspecified site of unspecified female breast: Secondary | ICD-10-CM

## 2017-09-11 ENCOUNTER — Other Ambulatory Visit: Payer: Self-pay | Admitting: Family Medicine

## 2017-09-20 ENCOUNTER — Other Ambulatory Visit (HOSPITAL_COMMUNITY): Payer: Medicare HMO

## 2017-10-18 ENCOUNTER — Other Ambulatory Visit (HOSPITAL_COMMUNITY): Payer: Medicare HMO

## 2017-10-23 ENCOUNTER — Other Ambulatory Visit: Payer: Self-pay | Admitting: Family Medicine

## 2017-10-25 DIAGNOSIS — H524 Presbyopia: Secondary | ICD-10-CM | POA: Diagnosis not present

## 2017-10-30 ENCOUNTER — Ambulatory Visit (INDEPENDENT_AMBULATORY_CARE_PROVIDER_SITE_OTHER): Payer: Medicare HMO | Admitting: Family Medicine

## 2017-10-30 ENCOUNTER — Encounter: Payer: Self-pay | Admitting: Family Medicine

## 2017-10-30 VITALS — BP 120/80 | HR 66 | Resp 16 | Ht 65.0 in | Wt 190.0 lb

## 2017-10-30 DIAGNOSIS — I10 Essential (primary) hypertension: Secondary | ICD-10-CM | POA: Diagnosis not present

## 2017-10-30 DIAGNOSIS — Z0000 Encounter for general adult medical examination without abnormal findings: Secondary | ICD-10-CM | POA: Diagnosis not present

## 2017-10-30 DIAGNOSIS — E785 Hyperlipidemia, unspecified: Secondary | ICD-10-CM

## 2017-10-30 DIAGNOSIS — E039 Hypothyroidism, unspecified: Secondary | ICD-10-CM

## 2017-10-30 LAB — COMPLETE METABOLIC PANEL WITH GFR
AG Ratio: 1.5 (calc) (ref 1.0–2.5)
ALBUMIN MSPROF: 3.8 g/dL (ref 3.6–5.1)
ALKALINE PHOSPHATASE (APISO): 39 U/L (ref 33–130)
ALT: 15 U/L (ref 6–29)
AST: 20 U/L (ref 10–35)
BUN / CREAT RATIO: 14 (calc) (ref 6–22)
BUN: 13 mg/dL (ref 7–25)
CALCIUM: 9.2 mg/dL (ref 8.6–10.4)
CO2: 29 mmol/L (ref 20–32)
CREATININE: 0.9 mg/dL — AB (ref 0.60–0.88)
Chloride: 104 mmol/L (ref 98–110)
GFR, EST AFRICAN AMERICAN: 68 mL/min/{1.73_m2} (ref >=60)
GFR, EST NON AFRICAN AMERICAN: 58 mL/min/{1.73_m2} — AB (ref >=60)
Globulin: 2.6 g/dL (calc) (ref 1.9–3.7)
Glucose, Bld: 90 mg/dL (ref 65–139)
Potassium: 3.9 mmol/L (ref 3.5–5.3)
Sodium: 140 mmol/L (ref 135–146)
TOTAL PROTEIN: 6.4 g/dL (ref 6.1–8.1)
Total Bilirubin: 0.6 mg/dL (ref 0.2–1.2)

## 2017-10-30 LAB — TSH: TSH: 1.9 mIU/L (ref 0.40–4.50)

## 2017-10-30 LAB — LIPID PANEL
CHOLESTEROL: 144 mg/dL (ref <200)
HDL: 62 mg/dL (ref >50)
LDL CHOLESTEROL (CALC): 67 mg/dL
Non-HDL Cholesterol (Calc): 82 mg/dL (calc) (ref <130)
Total CHOL/HDL Ratio: 2.3 (calc) (ref <5.0)
Triglycerides: 70 mg/dL (ref <150)

## 2017-10-30 NOTE — Patient Instructions (Signed)
F/u end August , call if you need me before  Lipid, cmp and eGFR and tSH today  All the best with cataract surgery  Careful not to fall!  Thank you  for choosing [facility]. We consider it a privelige to serve you.  Delivering excellent health care in a caring and  compassionate way is our goal.  Partnering with you,  so that together we can achieve this goal is our strategy.

## 2017-11-01 NOTE — Progress Notes (Signed)
Results mailed 

## 2017-11-03 ENCOUNTER — Encounter: Payer: Self-pay | Admitting: Family Medicine

## 2017-11-03 NOTE — Assessment & Plan Note (Signed)

## 2017-11-03 NOTE — Progress Notes (Signed)
    Lisa Klein     MRN: [id number]      DOB: [birth year]  HPI: Patient is in for annual physical exam. No other health concerns are expressed or addressed at the visit. Has upcoming cataract surgery  Recent labs, if available are reviewed. Immunization is reviewed , and  updated if needed.   PE: BP 120/80   Pulse 66   Resp 16   Ht 5\' 5"  (1.651 m)   Wt 190 lb (86.2 kg)   SpO2 97%   BMI 31.62 kg/m   Pleasant  female, alert and oriented x 3, in no cardio-pulmonary distress. Afebrile. HEENT No facial trauma or asymetry. Sinuses non tender.  Extra occullar muscles intactt. External ears normal, tympanic membranes clear. Oropharynx moist, no exudate.Poor dentition Neck: decreased ROM no adenopathy,JVD or thyromegaly.No bruits.  Chest: Clear to ascultation bilaterally.No crackles or wheezes. Non tender to palpation  Breast: No asymetry,no masses or lumps. No tenderness. No nipple discharge or inversion. No axillary or supraclavicular adenopathy  Cardiovascular system; Heart sounds normal,  S1 and  S2 ,no S3.  No murmur, or thrill. Apical beat not displaced Peripheral pulses normal.  Abdomen: Soft, non tender, no organomegaly or masses. No bruits. Bowel sounds normal. No guarding, tenderness or rebound.  Rectal:  Not done , .will return 3 stool cards GU: No exam indicated    Musculoskeletal exam: Decreased  ROM of spine, hips , shoulders and knees. Deformity ,swelling or crepitus noted. No muscle wasting or atrophy.   Neurologic: Cranial nerves 3 to 12 intact. Power, tone ,sensation and reflexes normal throughout. Abnormal gait. No tremor.  Skin: Intact, no ulceration, erythema , scaling or rash noted. Pigmentation normal throughout  Psych; Normal mood and affect. Judgement and concentration normal   Assessment & Plan:   Annual physical exam Annual exam as documented. Counseling done  re healthy lifestyle involving commitment to 150 minutes  exercise per week, heart healthy diet, and attaining healthy weight.The importance of adequate sleep also discussed. Regular seat belt use and home safety, is also discussed. Changes in health habits are decided on by the patient with goals and time frames  set for achieving them. Immunization and cancer screening needs are specifically addressed at this visit.

## 2017-11-05 DIAGNOSIS — H2513 Age-related nuclear cataract, bilateral: Secondary | ICD-10-CM | POA: Diagnosis not present

## 2017-11-05 DIAGNOSIS — H40023 Open angle with borderline findings, high risk, bilateral: Secondary | ICD-10-CM | POA: Diagnosis not present

## 2017-11-05 DIAGNOSIS — H02826 Cysts of left eye, unspecified eyelid: Secondary | ICD-10-CM | POA: Diagnosis not present

## 2017-11-05 DIAGNOSIS — H25013 Cortical age-related cataract, bilateral: Secondary | ICD-10-CM | POA: Diagnosis not present

## 2017-11-12 ENCOUNTER — Telehealth (HOSPITAL_COMMUNITY): Payer: Self-pay | Admitting: *Deleted

## 2017-11-13 ENCOUNTER — Other Ambulatory Visit (HOSPITAL_COMMUNITY): Payer: Self-pay

## 2017-11-13 ENCOUNTER — Other Ambulatory Visit: Payer: Self-pay | Admitting: Family Medicine

## 2017-11-13 ENCOUNTER — Encounter (HOSPITAL_COMMUNITY): Payer: Self-pay | Admitting: Emergency Medicine

## 2017-11-13 DIAGNOSIS — Z7901 Long term (current) use of anticoagulants: Secondary | ICD-10-CM

## 2017-11-13 MED ORDER — RIVAROXABAN 20 MG PO TABS: 20.0000 mg | ORAL_TABLET | Freq: Every day | ORAL | 1 refills | Status: DC

## 2017-11-13 NOTE — Progress Notes (Signed)
Xarelto faxed to [facility]

## 2017-11-13 NOTE — Telephone Encounter (Signed)
Patient called for refill on Xarelto. Reviewed with provider, chart checked and refilled.

## 2017-11-18 ENCOUNTER — Other Ambulatory Visit (HOSPITAL_COMMUNITY): Payer: Medicare HMO

## 2017-12-02 DIAGNOSIS — H401232 Low-tension glaucoma, bilateral, moderate stage: Secondary | ICD-10-CM | POA: Diagnosis not present

## 2017-12-18 ENCOUNTER — Other Ambulatory Visit (HOSPITAL_COMMUNITY): Payer: Medicare HMO

## 2018-01-01 DIAGNOSIS — H401212 Low-tension glaucoma, right eye, moderate stage: Secondary | ICD-10-CM | POA: Diagnosis not present

## 2018-01-08 ENCOUNTER — Other Ambulatory Visit (HOSPITAL_COMMUNITY): Payer: Self-pay | Admitting: Adult Health

## 2018-01-08 DIAGNOSIS — C50912 Malignant neoplasm of unspecified site of left female breast: Secondary | ICD-10-CM

## 2018-01-10 ENCOUNTER — Other Ambulatory Visit (HOSPITAL_COMMUNITY): Payer: Self-pay

## 2018-01-10 DIAGNOSIS — Z7901 Long term (current) use of anticoagulants: Secondary | ICD-10-CM

## 2018-01-10 MED ORDER — RIVAROXABAN 20 MG PO TABS
20.0000 mg | ORAL_TABLET | Freq: Every day | ORAL | 1 refills | Status: DC
Start: 2018-01-10 — End: 2018-08-21

## 2018-01-10 NOTE — Telephone Encounter (Signed)
Patient called for refill on Xarelto. She requested a 90 day supply. Reviewed with provider. New prescription sent to pharmacy.

## 2018-01-15 ENCOUNTER — Other Ambulatory Visit: Payer: Self-pay | Admitting: Family Medicine

## 2018-01-15 DIAGNOSIS — H401222 Low-tension glaucoma, left eye, moderate stage: Secondary | ICD-10-CM | POA: Diagnosis not present

## 2018-01-17 ENCOUNTER — Other Ambulatory Visit (HOSPITAL_COMMUNITY): Payer: Medicare HMO

## 2018-02-18 ENCOUNTER — Other Ambulatory Visit: Payer: Self-pay

## 2018-02-18 ENCOUNTER — Inpatient Hospital Stay (HOSPITAL_COMMUNITY): Payer: Medicare HMO | Admitting: Hematology

## 2018-02-18 ENCOUNTER — Encounter (HOSPITAL_COMMUNITY): Payer: Self-pay | Admitting: Hematology

## 2018-02-18 ENCOUNTER — Other Ambulatory Visit (HOSPITAL_COMMUNITY): Payer: Self-pay | Admitting: Nurse Practitioner

## 2018-02-18 ENCOUNTER — Inpatient Hospital Stay (HOSPITAL_COMMUNITY): Payer: Medicare HMO | Attending: Internal Medicine

## 2018-02-18 ENCOUNTER — Ambulatory Visit (HOSPITAL_COMMUNITY): Payer: Medicare HMO

## 2018-02-18 ENCOUNTER — Inpatient Hospital Stay (HOSPITAL_COMMUNITY): Payer: Medicare HMO

## 2018-02-18 ENCOUNTER — Other Ambulatory Visit (HOSPITAL_COMMUNITY): Payer: Medicare HMO

## 2018-02-18 VITALS — BP 148/63 | HR 72 | Temp 97.9°F | Resp 18 | Wt 188.2 lb

## 2018-02-18 DIAGNOSIS — Z170 Estrogen receptor positive status [ER+]: Secondary | ICD-10-CM | POA: Insufficient documentation

## 2018-02-18 DIAGNOSIS — M8580 Other specified disorders of bone density and structure, unspecified site: Secondary | ICD-10-CM

## 2018-02-18 DIAGNOSIS — C50919 Malignant neoplasm of unspecified site of unspecified female breast: Secondary | ICD-10-CM

## 2018-02-18 DIAGNOSIS — C50912 Malignant neoplasm of unspecified site of left female breast: Secondary | ICD-10-CM

## 2018-02-18 DIAGNOSIS — I10 Essential (primary) hypertension: Secondary | ICD-10-CM

## 2018-02-18 DIAGNOSIS — Z9012 Acquired absence of left breast and nipple: Secondary | ICD-10-CM | POA: Insufficient documentation

## 2018-02-18 DIAGNOSIS — Z79811 Long term (current) use of aromatase inhibitors: Secondary | ICD-10-CM

## 2018-02-18 DIAGNOSIS — C773 Secondary and unspecified malignant neoplasm of axilla and upper limb lymph nodes: Secondary | ICD-10-CM | POA: Diagnosis not present

## 2018-02-18 DIAGNOSIS — Z1231 Encounter for screening mammogram for malignant neoplasm of breast: Secondary | ICD-10-CM

## 2018-02-18 DIAGNOSIS — Z86718 Personal history of other venous thrombosis and embolism: Secondary | ICD-10-CM | POA: Insufficient documentation

## 2018-02-18 DIAGNOSIS — Z7901 Long term (current) use of anticoagulants: Secondary | ICD-10-CM | POA: Insufficient documentation

## 2018-02-18 LAB — COMPREHENSIVE METABOLIC PANEL
ALBUMIN: 3.6 g/dL (ref 3.5–5.0)
ALK PHOS: 36 U/L — AB (ref 38–126)
ALT: 19 U/L (ref 0–44)
AST: 24 U/L (ref 15–41)
Anion gap: 7 (ref 5–15)
BUN: 14 mg/dL (ref 8–23)
CALCIUM: 9 mg/dL (ref 8.9–10.3)
CHLORIDE: 110 mmol/L (ref 98–111)
CO2: 27 mmol/L (ref 22–32)
CREATININE: 0.84 mg/dL (ref 0.44–1.00)
GFR calc non Af Amer: >60 mL/min (ref >60)
GLUCOSE: 97 mg/dL (ref 70–99)
Potassium: 3.8 mmol/L (ref 3.5–5.1)
SODIUM: 144 mmol/L (ref 135–145)
Total Bilirubin: 0.8 mg/dL (ref 0.3–1.2)
Total Protein: 6.8 g/dL (ref 6.5–8.1)

## 2018-02-18 LAB — CBC WITH DIFFERENTIAL/PLATELET
BASOS ABS: 0.1 10*3/uL (ref 0.0–0.1)
BASOS PCT: 1 %
EOS ABS: 0.1 10*3/uL (ref 0.0–0.7)
EOS PCT: 3 %
HCT: 38.6 % (ref 36.0–46.0)
HEMOGLOBIN: 12.3 g/dL (ref 12.0–15.0)
Lymphocytes Relative: 40 %
Lymphs Abs: 1.6 10*3/uL (ref 0.7–4.0)
MCH: 29.1 pg (ref 26.0–34.0)
MCHC: 31.9 g/dL (ref 30.0–36.0)
MCV: 91.3 fL (ref 78.0–100.0)
Monocytes Absolute: 0.6 10*3/uL (ref 0.1–1.0)
Monocytes Relative: 14 %
NEUTROS ABS: 1.7 10*3/uL (ref 1.7–7.7)
Neutrophils Relative %: 42 %
PLATELETS: 163 10*3/uL (ref 150–400)
RBC: 4.23 MIL/uL (ref 3.87–5.11)
RDW: 15.3 % (ref 11.5–15.5)
WBC: 4.1 10*3/uL (ref 4.0–10.5)

## 2018-02-18 MED ORDER — DENOSUMAB 60 MG/ML ~~LOC~~ SOSY
60.0000 mg | PREFILLED_SYRINGE | Freq: Once | SUBCUTANEOUS | Status: AC
Start: 2018-02-18 — End: 2018-02-18
  Administered 2018-02-18: 60 mg via SUBCUTANEOUS
  Filled 2018-02-18: qty 1

## 2018-02-18 NOTE — Progress Notes (Signed)
Pt here today for office visit and Prolia injection. Pt given injection in her right upper abdomen. Pt tolerated injection well. Pt stable and discharged home ambulatory using walker. Pt to return in 6 months for next Prolia injection.

## 2018-02-18 NOTE — Assessment & Plan Note (Signed)
1.  Stage IIIa (T1 cN2 aM0) left breast IDC: -Status post left MRM on 02/14/2011, showing multifocal tumor, largest measuring 1.5 cm, grade 3, deep surgical margins positive, ER/PR positive, HER-2 negative, Ki-67 45%, 6 out of 7 lymph nodes positive.  Status post 6 cycles of epirubicin and Cytoxan, followed by radiation therapy - Anastrozole started on 11/19/2011 -Last mammogram of the right breast on 07/19/2017 was BI-RADS Category 1.  Today's physical examination did not reveal any suspicious nodules on the left mastectomy site or in the right breast.  She will continue anastrozole likely for 10 years.  She is tolerating it very well.  We will see her back in 6 months for follow-up.  We will schedule right breast mammogram in December.  2.  Osteopenia: -Last DEXA scan on 07/12/2016 shows T score of -1.4 in the femoral neck. -She was started on Prolia since 09/01/2014.  She is tolerating it well.  She will proceed with Prolia today.  I plan to repeat DEXA scan prior to next visit in December.  3.  Thromboembolism: - She had a right leg DVT in 2012.  Bilateral pulmonary embolism was diagnosed in 2014.  She is currently on Coumadin indefinitely.

## 2018-02-18 NOTE — Progress Notes (Signed)
[place], [place] [id number]   CLINIC:  Medical Oncology/Hematology  PCP:  [name], MD [address] [place] [address] [phone]   REASON FOR VISIT:  Follow-up for infiltrating ductal carcinoma of the left breast  CURRENT THERAPY: Arimidex   CANCER STAGING: Cancer Staging Infiltrating ductal carcinoma of breast ([place]) Staging form: Breast, AJCC 7th Edition - Clinical: Stage IIIA (T1c, N2a, cM0) - Signed by Baird Cancer, PA on 04/18/2011    INTERVAL HISTORY:  Ms. Schneck 84 y.o. female returns for routine follow-up of ductal carcinoma of the left breast S/P modified radical mastectomy. Patient lives at home alone and performs all her own ADLs. Patient is taking her Arimidex as prescribed with no side effects or issues. Patients last mammogram in Dec 2018. Denies hot flashes. Denies any new pains. Denies any fevers or infections recently. Her energy is about 50% and her appetite has remained good at 100%.    REVIEW OF SYSTEMS:  Review of Systems  Constitutional: Positive for fatigue.  HENT:  Negative.   Eyes: Negative.   Respiratory: Negative.   Cardiovascular: Negative.   Gastrointestinal: Negative.   Endocrine: Negative.   Genitourinary: Negative.    Musculoskeletal: Negative.   Skin: Negative.   Neurological: Negative.   Hematological: Negative.   Psychiatric/Behavioral: Negative.      PAST MEDICAL/SURGICAL HISTORY:  Past Medical History:  Diagnosis Date  . Cancer ([place]) 2012   LEFT BREAST  . Clotting disorder ([facility]) 05/2011   dvt, right leg  . Degenerative disc disease    with nerve compression   . DJD (degenerative joint disease)   . Hypertension   . Hypertension   . Hypothyroidism   . Hypothyroidism   . Long term current use of anticoagulant therapy 12/07/2014   Pulmonary embolus   . Obesity   . Osteopenia 08/24/2014   Past Surgical History:  Procedure Laterality Date  .  ABDOMINAL HYSTERECTOMY  1980   fibroids  . BREAST SURGERY  2012   left total mastectomy  . CHOLECYSTECTOMY  80'S   [facility]  . CHOLECYSTECTOMY    . HIP PINNING,CANNULATED Left 01/19/2013   Procedure: CANNULATED HIP PINNING-  left;  Surgeon: Marin Shutter, MD;  Location: [facility];  Service: Orthopedics;  Laterality: Left;  [name] MASTECTOMY MODIFIED RADICAL  02/14/11   left  . PORT-A-CATH REMOVAL N/A 12/05/2012   Procedure: MINOR REMOVAL PORT-A-CATH;  Surgeon: Jamesetta So, MD;  Location: [facility];  Service: General;  Laterality: N/A;  In Minor Room  . PORTACATH PLACEMENT  04/16/2011   Procedure: INSERTION PORT-A-CATH;  Surgeon: Jamesetta So;  Location: [facility];  Service: General;  Laterality: Right;  right subclavian  . VESICOVAGINAL FISTULA CLOSURE W/ TAH     [facility]     SOCIAL HISTORY:  Social History   Socioeconomic History  . Marital status: Widowed    Spouse name: Not on file  . Number of children: 2  . Years of education: Not on file  . Highest education level: Not on file  Occupational History  . Occupation: [employer] part time   Social Needs  . Financial resource strain: Not on file  . Food insecurity:    Worry: Not on file    Inability: Not on file  . Transportation needs:    Medical: Not on file    Non-medical: Not on file  Tobacco Use  . Smoking status: Never Smoker  . Smokeless  tobacco: Never Used  Substance and Sexual Activity  . Alcohol use: No  . Drug use: No  . Sexual activity: Never    Birth control/protection: Post-menopausal  Lifestyle  . Physical activity:    Days per week: Not on file    Minutes per session: Not on file  . Stress: Not on file  Relationships  . Social connections:    Talks on phone: Not on file    Gets together: Not on file    Attends religious service: Not on file    Active member of club or organization: Not on file    Attends meetings of clubs or organizations: Not on file    Relationship status: Not on file  . Intimate partner violence:     Fear of current or ex partner: Not on file    Emotionally abused: Not on file    Physically abused: Not on file    Forced sexual activity: Not on file  Other Topics Concern  . Not on file  Social History Narrative  . Not on file    FAMILY HISTORY:  Family History  Problem Relation Age of Onset  . COPD Father   . Lung disease Father   . Hypertension Sister   . Arthritis Mother   . Hypertension Sister   . Heart disease Son        stent  . Anesthesia problems Neg Hx   . Malignant hyperthermia Neg Hx   . Hypotension Neg Hx   . Pseudochol deficiency Neg Hx     CURRENT MEDICATIONS:  Outpatient Encounter Medications as of 02/18/2018  Medication Sig  . acetaminophen (TYLENOL) 650 MG CR tablet Take 650 mg by mouth 2 (two) times daily.   [name] anastrozole (ARIMIDEX) 1 MG tablet TAKE 1 TABLET AT BEDTIME  . aspirin EC 81 MG tablet Take 81 mg by mouth daily.  . calcium-vitamin D (OSCAL WITH D) 500-200 MG-UNIT tablet Take 1 tablet by mouth 2 (two) times daily.  . clotrimazole-betamethasone (LOTRISONE) cream Apply twice daily to rash on right knee  For 10 days ,then , as needed  . Cod Liver Oil 1000 MG CAPS Take 1 capsule by mouth daily.  [name] denosumab (PROLIA) 60 MG/ML SOLN injection Inject 60 mg into the skin every 6 (six) months. Administer in upper arm, thigh, or abdomen  . docusate sodium (COLACE) 100 MG capsule Take 100 mg by mouth daily as needed for constipation. For constipation  . hydrochlorothiazide (MICROZIDE) 12.5 MG capsule TAKE 1 CAPSULE EVERY MORNING  . levothyroxine (SYNTHROID, LEVOTHROID) 100 MCG tablet TAKE 1 TABLET EVERY MORNING  . lovastatin (MEVACOR) 20 MG tablet TAKE 1 TABLET AT BEDTIME  . potassium chloride (K-DUR) 10 MEQ tablet TAKE 1 TABLET TWICE DAILY  . potassium chloride (K-DUR,KLOR-CON) 10 MEQ tablet TAKE 1 TABLET TWICE DAILY  . pyridOXINE (VITAMIN B-6) 100 MG tablet Take 100 mg by mouth daily.  . rivaroxaban (XARELTO) 20 MG TABS tablet Take 1 tablet (20 mg total)  by mouth daily with supper.  . vitamin E 1000 UNIT capsule Take 1,000 Units by mouth daily.   Facility-Administered Encounter Medications as of 02/18/2018  Medication  . heparin lock flush 100 unit/mL  . heparin lock flush 100 unit/mL  . sodium chloride 0.9 % injection 10 mL  . sodium chloride 0.9 % injection 10 mL    ALLERGIES:  Allergies  Allergen Reactions  . Listerine [Antiseptic Mouth Rinse]     Caused sore throat  . Singulair [Montelukast Sodium]  Other (See Comments)    Abdominal spasm      PHYSICAL EXAM:  ECOG Performance status: 1  Vitals:   02/18/18 1054  BP: (!) 148/63  Pulse: 72  Resp: 18  Temp: 97.9 F (36.6 C)  SpO2: 100%   Filed Weights   02/18/18 1054  Weight: 188 lb 3.2 oz (85.4 kg)    Physical Exam  Constitutional: She is oriented to person, place, and time.  Cardiovascular: Normal rate, regular rhythm and normal heart sounds.  Pulmonary/Chest: Effort normal and breath sounds normal.  Neurological: She is alert and oriented to person, place, and time.  Skin: Skin is warm and dry.  Left mastectomy site is without any suspicious nodules.  Right breast has no palpable masses.  No palpable axillary or supraclavicular adenopathy. Abdomen: Soft nontender with no palpable abnormality.   LABORATORY DATA:  I have reviewed the labs as listed.  CBC    Component Value Date/Time   WBC 4.1 02/18/2018 0947   RBC 4.23 02/18/2018 0947   HGB 12.3 02/18/2018 0947   HCT 38.6 02/18/2018 0947   PLT 163 02/18/2018 0947   MCV 91.3 02/18/2018 0947   MCH 29.1 02/18/2018 0947   MCHC 31.9 02/18/2018 0947   RDW 15.3 02/18/2018 0947   LYMPHSABS 1.6 02/18/2018 0947   MONOABS 0.6 02/18/2018 0947   EOSABS 0.1 02/18/2018 0947   BASOSABS 0.1 02/18/2018 0947   CMP Latest Ref Rng & Units 02/18/2018 10/30/2017 08/21/2017  Glucose 70 - 99 mg/dL 97 90 82  BUN 8 - 23 mg/dL '14 13 13  ' Creatinine 0.44 - 1.00 mg/dL 0.84 0.90(H) 0.85  Sodium 135 - 145 mmol/L 144 140 140    Potassium 3.5 - 5.1 mmol/L 3.8 3.9 3.7  Chloride 98 - 111 mmol/L 110 104 103  CO2 22 - 32 mmol/L '27 29 28  ' Calcium 8.9 - 10.3 mg/dL 9.0 9.2 9.2  Total Protein 6.5 - 8.1 g/dL 6.8 6.4 7.2  Total Bilirubin 0.3 - 1.2 mg/dL 0.8 0.6 0.6  Alkaline Phos 38 - 126 U/L 36(L) - 39  AST 15 - 41 U/L '24 20 26  ' ALT 0 - 44 U/L '19 15 21       ' DIAGNOSTIC IMAGING:  I have reviewed her mammogram dated 07/19/2017 which was BI-RADS Category 2 of the right breast.     ASSESSMENT & PLAN:   Infiltrating ductal carcinoma of breast 1.  Stage IIIa (T1 cN2 aM0) left breast IDC: -Status post left MRM on 02/14/2011, showing multifocal tumor, largest measuring 1.5 cm, grade 3, deep surgical margins positive, ER/PR positive, HER-2 negative, Ki-67 45%, 6 out of 7 lymph nodes positive.  Status post 6 cycles of epirubicin and Cytoxan, followed by radiation therapy - Anastrozole started on 11/19/2011 -Last mammogram of the right breast on 07/19/2017 was BI-RADS Category 1.  Today's physical examination did not reveal any suspicious nodules on the left mastectomy site or in the right breast.  She will continue anastrozole likely for 10 years.  She is tolerating it very well.  We will see her back in 6 months for follow-up.  We will schedule right breast mammogram in December.  2.  Osteopenia: -Last DEXA scan on 07/12/2016 shows T score of -1.4 in the femoral neck. -She was started on Prolia since 09/01/2014.  She is tolerating it well.  She will proceed with Prolia today.  I plan to repeat DEXA scan prior to next visit in December.  3.  Thromboembolism: - She had a  right leg DVT in 2012.  Bilateral pulmonary embolism was diagnosed in 2014.  She is currently on Coumadin indefinitely.      Orders placed this encounter:  Orders Placed This Encounter  Procedures  . MM Digital Screening Unilat R  . DG Bone Density  . Vitamin D 25 hydroxy  . CBC with Differential/Platelet  . Comprehensive metabolic panel       Derek Jack, MD [place] [phone]

## 2018-02-18 NOTE — Patient Instructions (Signed)
[facility] at [facility] Discharge Instructions  Today you saw Dr. K.   Thank you for choosing [facility] at [facility] to provide your oncology and hematology care.  To afford each patient quality time with our provider, please arrive at least 15 minutes before your scheduled appointment time.   If you have a lab appointment with the Cancer Center please come in thru the  Main Entrance and check in at the main information desk  You need to re-schedule your appointment should you arrive 10 or more minutes late.  We strive to give you quality time with our providers, and arriving late affects you and other patients whose appointments are after yours.  Also, if you no show three or more times for appointments you may be dismissed from the clinic at the providers discretion.     Again, thank you for choosing [facility].  Our hope is that these requests will decrease the amount of time that you wait before being seen by our physicians.       _____________________________________________________________  Should you have questions after your visit to [facility], please contact our office at [phone] between the hours of 8:30 a.m. and 4:30 p.m.  Voicemails left after 4:30 p.m. will not be returned until the following business day.  For prescription refill requests, have your pharmacy contact our office.       Resources For Cancer Patients and their Caregivers ? American Cancer Society: Can assist with transportation, wigs, general needs, runs Look Good Feel Better.        1-888-227-6333 ? Cancer Care: Provides financial assistance, online support groups, medication/co-pay assistance.  1-800-813-HOPE (4673) ? [facility] Assists [place] cancer patients and their families through emotional , educational and financial support.  336-427-4357 ? [place] DSS Where to apply for food  stamps, Medicaid and utility assistance. 336-342-1394 ? [facility]: Transportation to medical appointments. 336-347-2287 ? Social Security Administration: May apply for disability if have a Stage IV cancer. 336-342-7796 1-800-772-1213 ? [place] Aging, Disability and Transit Services: Assists with nutrition, care and transit needs. 336-349-2343  Cancer Center Support Programs:   > Cancer Support Group  2nd Tuesday of the month 1pm-2pm, Journey Room   > Creative Journey  3rd Tuesday of the month 1130am-1pm, Journey Room    

## 2018-03-03 DIAGNOSIS — H401232 Low-tension glaucoma, bilateral, moderate stage: Secondary | ICD-10-CM | POA: Diagnosis not present

## 2018-03-12 ENCOUNTER — Other Ambulatory Visit (HOSPITAL_COMMUNITY): Payer: Self-pay | Admitting: *Deleted

## 2018-03-12 DIAGNOSIS — C50912 Malignant neoplasm of unspecified site of left female breast: Secondary | ICD-10-CM

## 2018-03-12 MED ORDER — ANASTROZOLE 1 MG PO TABS: 1.0000 mg | ORAL_TABLET | Freq: Every day | ORAL | 1 refills | Status: DC

## 2018-03-17 ENCOUNTER — Other Ambulatory Visit: Payer: Self-pay | Admitting: Family Medicine

## 2018-03-22 ENCOUNTER — Other Ambulatory Visit: Payer: Self-pay | Admitting: Family Medicine

## 2018-04-02 ENCOUNTER — Ambulatory Visit (INDEPENDENT_AMBULATORY_CARE_PROVIDER_SITE_OTHER): Payer: Medicare HMO | Admitting: Family Medicine

## 2018-04-02 ENCOUNTER — Encounter: Payer: Self-pay | Admitting: Family Medicine

## 2018-04-02 VITALS — BP 122/74 | HR 81 | Resp 16 | Ht 65.0 in | Wt 189.0 lb

## 2018-04-02 DIAGNOSIS — G8929 Other chronic pain: Secondary | ICD-10-CM | POA: Diagnosis not present

## 2018-04-02 DIAGNOSIS — I10 Essential (primary) hypertension: Secondary | ICD-10-CM

## 2018-04-02 DIAGNOSIS — M159 Polyosteoarthritis, unspecified: Secondary | ICD-10-CM

## 2018-04-02 DIAGNOSIS — E7849 Other hyperlipidemia: Secondary | ICD-10-CM

## 2018-04-02 DIAGNOSIS — Z23 Encounter for immunization: Secondary | ICD-10-CM | POA: Diagnosis not present

## 2018-04-02 DIAGNOSIS — E039 Hypothyroidism, unspecified: Secondary | ICD-10-CM

## 2018-04-02 DIAGNOSIS — M25511 Pain in right shoulder: Secondary | ICD-10-CM | POA: Diagnosis not present

## 2018-04-02 NOTE — Assessment & Plan Note (Signed)
Controlled, no change in medication DASH diet and commitment to daily physical activity for a minimum of 30 minutes discussed and encouraged, as a part of hypertension management. The importance of attaining a healthy weight is also discussed.  BP/Weight 04/02/2018 02/18/2018 10/30/2017 08/21/2017 05/29/2017 04/18/2017 5/39/6728  Systolic BP 979 150 413 643 837 793 968  Diastolic BP 74 63 80 71 74 70 67  Wt. (Lbs) 189 188.2 190 186.8 192.04 189 194.8  BMI 31.45 31.32 31.62 31.09 31.96 31.45 32.42

## 2018-04-02 NOTE — Progress Notes (Signed)
   Lisa Klein     MRN: [id number]      DOB: [birth year]   HPI Lisa Klein is here for follow up and re-evaluation of chronic medical conditions, medication management and review of any available recent lab and radiology data.  Preventive health is updated, specifically  Cancer screening and Immunization.   Questions or concerns regarding consultations or procedures which the PT has had in the interim are  addressed. The PT denies any adverse reactions to current medications since the last visit.  C/o increased exertional fatigue and poor exercise tolerance which she attributes to aging C/o increased left shoulder pain which started after she had to pull a dead deer out of her driveway  ROS Denies recent fever or chills. Denies sinus pressure, nasal congestion, ear pain or sore throat. Denies chest congestion, productive cough or wheezing. Denies chest pains, palpitations and leg swelling Denies abdominal pain, nausea, vomiting,diarrhea or constipation.   Denies dysuria, frequency, hesitancy or incontinence. . Denies headaches, seizures, numbness, or tingling. Denies depression, anxiety or insomnia. Denies skin break down or rash.   PE  BP 122/74   Pulse 81   Resp 16   Ht 5\' 5"  (1.651 m)   Wt 189 lb (85.7 kg)   SpO2 100%   BMI 31.45 kg/m   Patient alert and oriented and in no cardiopulmonary distress.  HEENT: No facial asymmetry, EOMI,   oropharynx pink and moist.  Neck decreased ROM no JVD, no mass.  Chest: Clear to auscultation bilaterally.  CVS: S1, S2 no murmurs, no S3.Regular rate.  ABD: Soft non tender.   Ext: No edema  MS: decreased ROM spine, hips ,  and knees.Right shoulder has reduced ROM and lis slightly swollen  Skin: Intact, no ulcerations or rash noted.  Psych: Good eye contact, normal affect. Memory intact not anxious or depressed appearing.  CNS: CN 2-12 intact, power,  normal throughout.no focal deficits noted.   Assessment &  Plan  Essential hypertension Controlled, no change in medication DASH diet and commitment to daily physical activity for a minimum of 30 minutes discussed and encouraged, as a part of hypertension management. The importance of attaining a healthy weight is also discussed.  BP/Weight 04/02/2018 02/18/2018 10/30/2017 08/21/2017 05/29/2017 04/18/2017 9/93/5701  Systolic BP 779 390 300 923 300 762 263  Diastolic BP 74 63 80 71 74 70 67  Wt. (Lbs) 189 188.2 190 186.8 192.04 189 194.8  BMI 31.45 31.32 31.62 31.09 31.96 31.45 32.42       Hypothyroidism Controlled, no change in medication   Hyperlipemia Hyperlipidemia:Low fat diet discussed and encouraged.   Lipid Panel  Lab Results  Component Value Date   CHOL 144 10/30/2017   HDL 62 10/30/2017   LDLCALC 67 10/30/2017   TRIG 70 10/30/2017   CHOLHDL 2.3 10/30/2017   Controlled, no change in medication     Generalized osteoarthritis of multiple sites Home safety reviewed and the need to use her assistive device at all times for stability Right shoulder has limited movement following the fall, wil obtain X ray  Chronic right shoulder pain Increased in severity following recent trauma , with more  marked limitation in movement, will obtain an X ray as she is symptomatic

## 2018-04-02 NOTE — Assessment & Plan Note (Signed)
Home safety reviewed and the need to use her assistive device at all times for stability Right shoulder has limited movement following the fall, wil obtain X ray

## 2018-04-02 NOTE — Assessment & Plan Note (Signed)
Hyperlipidemia:Low fat diet discussed and encouraged.   Lipid Panel  Lab Results  Component Value Date   CHOL 144 10/30/2017   HDL 62 10/30/2017   LDLCALC 67 10/30/2017   TRIG 70 10/30/2017   CHOLHDL 2.3 10/30/2017   Controlled, no change in medication

## 2018-04-02 NOTE — Assessment & Plan Note (Signed)
Controlled, no change in medication  

## 2018-04-02 NOTE — Patient Instructions (Addendum)
Keep wellness appointment with nurse as before  Physical exam with mD March 30 or after, call if you need me before  Influenza vaccine today  Please be careful not to fall  Labs are excellent , no medication changes   Thank you  for choosing [facility]. We consider it a privelige to serve you.  Delivering excellent health care in a caring and  compassionate way is our goal.  Partnering with you,  so that together we can achieve this goal is our strategy.

## 2018-04-07 DIAGNOSIS — M25511 Pain in right shoulder: Secondary | ICD-10-CM

## 2018-04-07 DIAGNOSIS — G8929 Other chronic pain: Secondary | ICD-10-CM | POA: Insufficient documentation

## 2018-04-07 NOTE — Assessment & Plan Note (Signed)
Increased in severity following recent trauma , with more  marked limitation in movement, will obtain an X ray as she is symptomatic

## 2018-05-15 ENCOUNTER — Other Ambulatory Visit (HOSPITAL_COMMUNITY): Payer: Self-pay | Admitting: Hematology

## 2018-05-15 DIAGNOSIS — C50912 Malignant neoplasm of unspecified site of left female breast: Secondary | ICD-10-CM

## 2018-05-23 ENCOUNTER — Other Ambulatory Visit (HOSPITAL_COMMUNITY): Payer: Self-pay | Admitting: *Deleted

## 2018-05-23 DIAGNOSIS — Z7901 Long term (current) use of anticoagulants: Secondary | ICD-10-CM

## 2018-05-23 DIAGNOSIS — C50912 Malignant neoplasm of unspecified site of left female breast: Secondary | ICD-10-CM

## 2018-05-23 MED ORDER — ANASTROZOLE 1 MG PO TABS: 1.0000 mg | ORAL_TABLET | Freq: Every day | ORAL | 1 refills | Status: DC

## 2018-05-27 ENCOUNTER — Other Ambulatory Visit: Payer: Self-pay | Admitting: Family Medicine

## 2018-06-09 ENCOUNTER — Ambulatory Visit: Payer: Medicare HMO

## 2018-06-12 ENCOUNTER — Ambulatory Visit (INDEPENDENT_AMBULATORY_CARE_PROVIDER_SITE_OTHER): Payer: Medicare HMO

## 2018-06-12 VITALS — BP 127/80 | HR 96 | Resp 10 | Ht 65.0 in | Wt 189.0 lb

## 2018-06-12 DIAGNOSIS — Z0000 Encounter for general adult medical examination without abnormal findings: Secondary | ICD-10-CM | POA: Diagnosis not present

## 2018-06-12 NOTE — Patient Instructions (Signed)
Lisa Klein , Thank you for taking time to come for your Medicare Wellness Visit. I appreciate your ongoing commitment to your health goals. Please review the following plan we discussed and let me know if I can assist you in the future.   Screening recommendations/referrals: Colonoscopy: Postponed  Mammogram: scheduled  Bone Density: scheduled  Recommended yearly ophthalmology/optometry visit for glaucoma screening and checkup Recommended yearly dental visit for hygiene and checkup  Vaccinations: Influenza vaccine: up to date  Pneumococcal vaccine: up to date  Tdap vaccine: up to date  Shingles vaccine: up to date     Advanced directives: information given   Conditions/risks identified: impaired mobility, advanced age, chronic pain   Next appointment: Wellness in one year    Preventive Care 9 Years and Older, Female Preventive care refers to lifestyle choices and visits with your health care provider that can promote health and wellness. What does preventive care include?  A yearly physical exam. This is also called an annual well check.  Dental exams once or twice a year.  Routine eye exams. Ask your health care provider how often you should have your eyes checked.  Personal lifestyle choices, including:  Daily care of your teeth and gums.  Regular physical activity.  Eating a healthy diet.  Avoiding tobacco and drug use.  Limiting alcohol use.  Practicing safe sex.  Taking low-dose aspirin every day.  Taking vitamin and mineral supplements as recommended by your health care provider. What happens during an annual well check? The services and screenings done by your health care provider during your annual well check will depend on your age, overall health, lifestyle risk factors, and family history of disease. Counseling  Your health care provider may ask you questions about your:  Alcohol use.  Tobacco use.  Drug use.  Emotional well-being.  Home  and relationship well-being.  Sexual activity.  Eating habits.  History of falls.  Memory and ability to understand (cognition).  Work and work Statistician.  Reproductive health. Screening  You may have the following tests or measurements:  Height, weight, and BMI.  Blood pressure.  Lipid and cholesterol levels. These may be checked every 5 years, or more frequently if you are over 15 years old.  Skin check.  Lung cancer screening. You may have this screening every year starting at age 84 if you have a 30-pack-year history of smoking and currently smoke or have quit within the past 15 years.  Fecal occult blood test (FOBT) of the stool. You may have this test every year starting at age 66.  Flexible sigmoidoscopy or colonoscopy. You may have a sigmoidoscopy every 5 years or a colonoscopy every 10 years starting at age 77.  Hepatitis C blood test.  Hepatitis B blood test.  Sexually transmitted disease (STD) testing.  Diabetes screening. This is done by checking your blood sugar (glucose) after you have not eaten for a while (fasting). You may have this done every 1-3 years.  Bone density scan. This is done to screen for osteoporosis. You may have this done starting at age 48.  Mammogram. This may be done every 1-2 years. Talk to your health care provider about how often you should have regular mammograms. Talk with your health care provider about your test results, treatment options, and if necessary, the need for more tests. Vaccines  Your health care provider may recommend certain vaccines, such as:  Influenza vaccine. This is recommended every year.  Tetanus, diphtheria, and acellular pertussis (Tdap, Td)  vaccine. You may need a Td booster every 10 years.  Zoster vaccine. You may need this after age 74.  Pneumococcal 13-valent conjugate (PCV13) vaccine. One dose is recommended after age 27.  Pneumococcal polysaccharide (PPSV23) vaccine. One dose is recommended  after age 14. Talk to your health care provider about which screenings and vaccines you need and how often you need them. This information is not intended to replace advice given to you by your health care provider. Make sure you discuss any questions you have with your health care provider. Document Released: 08/19/2015 Document Revised: 04/11/2016 Document Reviewed: 05/24/2015 Elsevier Interactive Patient Education  2017 [place] Prevention in the Home Falls can cause injuries. They can happen to people of all ages. There are many things you can do to make your home safe and to help prevent falls. What can I do on the outside of my home?  Regularly fix the edges of walkways and driveways and fix any cracks.  Remove anything that might make you trip as you walk through a door, such as a raised step or threshold.  Trim any bushes or trees on the path to your home.  Use bright outdoor lighting.  Clear any walking paths of anything that might make someone trip, such as rocks or tools.  Regularly check to see if handrails are loose or broken. Make sure that both sides of any steps have handrails.  Any raised decks and porches should have guardrails on the edges.  Have any leaves, snow, or ice cleared regularly.  Use sand or salt on walking paths during winter.  Clean up any spills in your garage right away. This includes oil or grease spills. What can I do in the bathroom?  Use night lights.  Install grab bars by the toilet and in the tub and shower. Do not use towel bars as grab bars.  Use non-skid mats or decals in the tub or shower.  If you need to sit down in the shower, use a plastic, non-slip stool.  Keep the floor dry. Clean up any water that spills on the floor as soon as it happens.  Remove soap buildup in the tub or shower regularly.  Attach bath mats securely with double-sided non-slip rug tape.  Do not have throw rugs and other things on the floor  that can make you trip. What can I do in the bedroom?  Use night lights.  Make sure that you have a light by your bed that is easy to reach.  Do not use any sheets or blankets that are too big for your bed. They should not hang down onto the floor.  Have a firm chair that has side arms. You can use this for support while you get dressed.  Do not have throw rugs and other things on the floor that can make you trip. What can I do in the kitchen?  Clean up any spills right away.  Avoid walking on wet floors.  Keep items that you use a lot in easy-to-reach places.  If you need to reach something above you, use a strong step stool that has a grab bar.  Keep electrical cords out of the way.  Do not use floor polish or wax that makes floors slippery. If you must use wax, use non-skid floor wax.  Do not have throw rugs and other things on the floor that can make you trip. What can I do with my stairs?  Do not leave  any items on the stairs.  Make sure that there are handrails on both sides of the stairs and use them. Fix handrails that are broken or loose. Make sure that handrails are as long as the stairways.  Check any carpeting to make sure that it is firmly attached to the stairs. Fix any carpet that is loose or worn.  Avoid having throw rugs at the top or bottom of the stairs. If you do have throw rugs, attach them to the floor with carpet tape.  Make sure that you have a light switch at the top of the stairs and the bottom of the stairs. If you do not have them, ask someone to add them for you. What else can I do to help prevent falls?  Wear shoes that:  Do not have high heels.  Have rubber bottoms.  Are comfortable and fit you well.  Are closed at the toe. Do not wear sandals.  If you use a stepladder:  Make sure that it is fully opened. Do not climb a closed stepladder.  Make sure that both sides of the stepladder are locked into place.  Ask someone to hold it  for you, if possible.  Clearly mark and make sure that you can see:  Any grab bars or handrails.  First and last steps.  Where the edge of each step is.  Use tools that help you move around (mobility aids) if they are needed. These include:  Canes.  Walkers.  Scooters.  Crutches.  Turn on the lights when you go into a dark area. Replace any light bulbs as soon as they burn out.  Set up your furniture so you have a clear path. Avoid moving your furniture around.  If any of your floors are uneven, fix them.  If there are any pets around you, be aware of where they are.  Review your medicines with your doctor. Some medicines can make you feel dizzy. This can increase your chance of falling. Ask your doctor what other things that you can do to help prevent falls. This information is not intended to replace advice given to you by your health care provider. Make sure you discuss any questions you have with your health care provider. Document Released: 05/19/2009 Document Revised: 12/29/2015 Document Reviewed: 08/27/2014 Elsevier Interactive Patient Education  2017 [employer].

## 2018-06-12 NOTE — Progress Notes (Signed)
Subjective:   EMILLEE TALSMA is a 84 y.o. female who presents for Medicare Annual (Subsequent) preventive examination.  Review of Systems:   Cardiac Risk Factors include: advanced age (>31men, >60 women);hypertension;dyslipidemia;obesity (BMI >30kg/m2);sedentary lifestyle     Objective:     Vitals: BP 127/80   Pulse 96   Resp 10   Ht 5\' 5"  (1.651 m)   Wt 189 lb (85.7 kg)   SpO2 98%   BMI 31.45 kg/m   Body mass index is 31.45 kg/m.  Advanced Directives 06/12/2018 02/18/2018 08/21/2017 05/29/2017 02/22/2017 08/24/2016 07/25/2016  Does Patient Have a Medical Advance Directive? No No No No No No No  Would patient like information on creating a medical advance directive? Yes (ED - Information included in AVS) No - Patient declined No - Patient declined Yes (MAU/Ambulatory/Procedural Areas - Information given) Yes (MAU/Ambulatory/Procedural Areas - Information given) - Yes (ED - Information included in AVS)  Pre-existing out of facility DNR order (yellow form or pink MOST form) - - - - - - -    Tobacco Social History   Tobacco Use  Smoking Status Never Smoker  Smokeless Tobacco Never Used     Counseling given: Not Answered   Clinical Intake:  Pre-visit preparation completed: Yes  Pain : 0-10 Pain Score: 3  Pain Type: Chronic pain Pain Location: Neck Pain Orientation: Medial Pain Descriptors / Indicators: Aching Pain Onset: 1 to 4 weeks ago Pain Frequency: Constant Pain Relieving Factors: tylenol   Pain Relieving Factors: tylenol   BMI - recorded: 31.5 Nutritional Status: BMI > 30  Obese Nutritional Risks: None Diabetes: No  How often do you need to have someone help you when you read instructions, pamphlets, or other written materials from your doctor or pharmacy?: 3 - Sometimes What is the last grade level you completed in school?: 12 grade   Interpreter Needed?: No  Information entered by :: Francena Hanly LPN   Past Medical History:  Diagnosis Date  .  Cancer ([place]) 2012   LEFT BREAST  . Clotting disorder ([place]) 05/2011   dvt, right leg  . Degenerative disc disease    with nerve compression   . DJD (degenerative joint disease)   . Hypertension   . Hypertension   . Hypothyroidism   . Hypothyroidism   . Long term current use of anticoagulant therapy 12/07/2014   Pulmonary embolus   . Obesity   . Osteopenia 08/24/2014   Past Surgical History:  Procedure Laterality Date  . ABDOMINAL HYSTERECTOMY  1980   fibroids  . BREAST SURGERY  2012   left total mastectomy  . CHOLECYSTECTOMY  80'S   [facility]  . CHOLECYSTECTOMY    . HIP PINNING,CANNULATED Left 01/19/2013   Procedure: CANNULATED HIP PINNING-  left;  Surgeon: Marin Shutter, MD;  Location: [facility];  Service: Orthopedics;  Laterality: Left;  [name] MASTECTOMY MODIFIED RADICAL  02/14/11   left  . PORT-A-CATH REMOVAL N/A 12/05/2012   Procedure: MINOR REMOVAL PORT-A-CATH;  Surgeon: Jamesetta So, MD;  Location: [facility];  Service: General;  Laterality: N/A;  In Minor Room  . PORTACATH PLACEMENT  04/16/2011   Procedure: INSERTION PORT-A-CATH;  Surgeon: Jamesetta So;  Location: [facility];  Service: General;  Laterality: Right;  right subclavian  . VESICOVAGINAL FISTULA CLOSURE W/ TAH     [facility]   Family History  Problem Relation Age of Onset  . COPD Father   . Lung disease Father   . Hypertension Sister   .  Arthritis Mother   . Hypertension Sister   . Heart disease Son        stent  . Anesthesia problems Neg Hx   . Malignant hyperthermia Neg Hx   . Hypotension Neg Hx   . Pseudochol deficiency Neg Hx    Social History   Socioeconomic History  . Marital status: Widowed    Spouse name: Not on file  . Number of children: 2  . Years of education: 35  . Highest education level: 12th grade  Occupational History  . Occupation: [employer] part time   . Occupation: retired/disabled   Social Needs  . Financial resource strain: Somewhat hard  . Food insecurity:    Worry: Never true    Inability: Never  true  . Transportation needs:    Medical: No    Non-medical: No  Tobacco Use  . Smoking status: Never Smoker  . Smokeless tobacco: Never Used  Substance and Sexual Activity  . Alcohol use: No  . Drug use: No  . Sexual activity: Not Currently    Birth control/protection: Post-menopausal  Lifestyle  . Physical activity:    Days per week: 0 days    Minutes per session: 0 min  . Stress: Only a little  Relationships  . Social connections:    Talks on phone: Twice a week    Gets together: Once a week    Attends religious service: Never    Active member of club or organization: No    Attends meetings of clubs or organizations: Never    Relationship status: Widowed  Other Topics Concern  . Not on file  Social History Narrative   Reports starting to have a lot of trouble doing things for herself. Lives alone, tries to drive to places, but it is getting increasingly harder to do.     Outpatient Encounter Medications as of 06/12/2018  Medication Sig  . acetaminophen (TYLENOL) 650 MG CR tablet Take 650 mg by mouth 2 (two) times daily.   [name] anastrozole (ARIMIDEX) 1 MG tablet Take 1 tablet (1 mg total) by mouth at bedtime.  [name] aspirin EC 81 MG tablet Take 81 mg by mouth daily.  . calcium-vitamin D (OSCAL WITH D) 500-200 MG-UNIT tablet Take 1 tablet by mouth 2 (two) times daily.  . clotrimazole-betamethasone (LOTRISONE) cream Apply twice daily to rash on right knee  For 10 days ,then , as needed  . Cod Liver Oil 1000 MG CAPS Take 1 capsule by mouth daily.  [name] denosumab (PROLIA) 60 MG/ML SOLN injection Inject 60 mg into the skin every 6 (six) months. Administer in upper arm, thigh, or abdomen  . docusate sodium (COLACE) 100 MG capsule Take 100 mg by mouth daily as needed for constipation. For constipation  . hydrochlorothiazide (MICROZIDE) 12.5 MG capsule TAKE 1 CAPSULE EVERY MORNING  . levothyroxine (SYNTHROID, LEVOTHROID) 100 MCG tablet TAKE 1 TABLET EVERY MORNING  . lovastatin (MEVACOR) 20  MG tablet TAKE 1 TABLET AT BEDTIME  . potassium chloride (K-DUR) 10 MEQ tablet TAKE 1 TABLET TWICE DAILY  . potassium chloride (K-DUR,KLOR-CON) 10 MEQ tablet TAKE 1 TABLET TWICE DAILY  . pyridOXINE (VITAMIN B-6) 100 MG tablet Take 100 mg by mouth daily.  . rivaroxaban (XARELTO) 20 MG TABS tablet Take 1 tablet (20 mg total) by mouth daily with supper.  . vitamin E 1000 UNIT capsule Take 1,000 Units by mouth daily.   Facility-Administered Encounter Medications as of 06/12/2018  Medication  . heparin lock flush 100 unit/mL  .  heparin lock flush 100 unit/mL  . sodium chloride 0.9 % injection 10 mL  . sodium chloride 0.9 % injection 10 mL    Activities of Daily Living In your present state of health, do you have any difficulty performing the following activities: 06/12/2018  Hearing? N  Vision? N  Difficulty concentrating or making decisions? Y  Walking or climbing stairs? Y  Dressing or bathing? Y  Doing errands, shopping? Y  Preparing Food and eating ? N  Using the Toilet? N  In the past six months, have you accidently leaked urine? N  Do you have problems with loss of bowel control? N  Managing your Medications? N  Managing your Finances? N  Housekeeping or managing your Housekeeping? Y  Some recent data might be hidden    Patient Care Team: [name], MD as PCP - General Twana First, MD as Consulting Physician (Oncology) Sheldon Silvan Scharlene Corn as Physician Assistant (Oncology)    Assessment:   This is a routine wellness examination for [place].  Exercise Activities and Dietary recommendations Current Exercise Habits: The patient does not participate in regular exercise at present, Exercise limited by: orthopedic condition(s);cardiac condition(s)  Goals    . Exercise 3x per week (30 min per time)     Starting today 07/25/2016 patient would like to increase exercise to at least 30 minutes 3 times a week.     [name] LIFESTYLE - DECREASE FALLS RISK       Fall  Risk Fall Risk  06/12/2018 10/30/2017 05/29/2017 04/18/2017 07/25/2016  Falls in the past year? 1 No No No No  Number falls in past yr: 0 - - - -  Injury with Fall? 0 - - - -  Risk Factor Category  - - - - -  Risk for fall due to : History of fall(s);Medication side effect;Impaired balance/gait;Impaired mobility - - - Impaired vision;Impaired balance/gait  Follow up Falls prevention discussed - - - -   Is the patient's home free of loose throw rugs in walkways, pet beds, electrical cords, etc?   no      Grab bars in the bathroom? yes      Handrails on the stairs?   yes      Adequate lighting?   yes  Timed Get Up and Go performed: Patient able to perform in 13 seconds with the aide of a cane   Depression Screen PHQ 2/9 Scores 06/12/2018 05/29/2017 04/18/2017 07/25/2016  PHQ - 2 Score 0 1 0 0     Cognitive Function     6CIT Screen 06/12/2018 05/29/2017 07/25/2016  What Year? 0 points 0 points 0 points  What month? 0 points 0 points 0 points  What time? 0 points 0 points 0 points  Count back from 20 0 points 0 points 0 points  Months in reverse 0 points 0 points 0 points  Repeat phrase 0 points 0 points 0 points  Total Score 0 0 0    Immunization History  Administered Date(s) Administered  . Influenza Whole 06/06/2009, 05/17/2010  . Influenza,inj,Quad PF,6+ Mos 06/29/2013, 07/15/2014, 05/02/2015, 07/25/2016, 04/18/2017, 04/02/2018  . Pneumococcal Conjugate-13 03/03/2014  . Pneumococcal Polysaccharide-23 08/07/2001  . Td 02/29/2004  . Tdap 11/13/2010  . Zoster 10/07/2006    Qualifies for Shingles Vaccine?up to date   Screening Tests Health Maintenance  Topic Date Due  . TETANUS/TDAP  11/12/2020  . INFLUENZA VACCINE  Completed  . DEXA SCAN  Completed  . PNA vac Low Risk Adult  Completed    Cancer Screenings: Lung: Low Dose CT Chest recommended if Age 38-80 years, 30 pack-year currently smoking OR have quit w/in 15years. Patient does not qualify. Breast:  Up to date on  Mammogram? Yes   Up to date of Bone Density/Dexa? Yes Colorectal: postponed   Additional Screenings: : Hepatitis C Screening: N/A     Plan:   Continue to increase physical activity, decrease fall risk, increase water intake   I have personally reviewed and noted the following in the patient's chart:   . Medical and social history . Use of alcohol, tobacco or illicit drugs  . Current medications and supplements . Functional ability and status . Nutritional status . Physical activity . Advanced directives . List of other physicians . Hospitalizations, surgeries, and ER visits in previous 12 months . Vitals . Screenings to include cognitive, depression, and falls . Referrals and appointments  In addition, I have reviewed and discussed with patient certain preventive protocols, quality metrics, and best practice recommendations. A written personalized care plan for preventive services as well as general preventive health recommendations were provided to patient.     Hayden Pedro, LPN  [birth year]

## 2018-07-15 ENCOUNTER — Other Ambulatory Visit (HOSPITAL_COMMUNITY): Payer: Self-pay | Admitting: Hematology

## 2018-07-15 ENCOUNTER — Other Ambulatory Visit: Payer: Self-pay | Admitting: Family Medicine

## 2018-07-15 DIAGNOSIS — Z7901 Long term (current) use of anticoagulants: Secondary | ICD-10-CM

## 2018-07-21 ENCOUNTER — Other Ambulatory Visit (HOSPITAL_COMMUNITY): Payer: Self-pay | Admitting: *Deleted

## 2018-07-21 ENCOUNTER — Ambulatory Visit (HOSPITAL_COMMUNITY)
Admission: RE | Admit: 2018-07-21 | Discharge: 2018-07-21 | Disposition: A | Discharge status: Home / Self Care | Payer: Medicare HMO | Source: Ambulatory Visit | Attending: Nurse Practitioner | Admitting: Nurse Practitioner

## 2018-07-21 ENCOUNTER — Encounter (HOSPITAL_COMMUNITY): Payer: Self-pay

## 2018-07-21 DIAGNOSIS — Z1231 Encounter for screening mammogram for malignant neoplasm of breast: Secondary | ICD-10-CM

## 2018-07-21 DIAGNOSIS — Z780 Asymptomatic menopausal state: Secondary | ICD-10-CM | POA: Diagnosis not present

## 2018-07-21 DIAGNOSIS — M8580 Other specified disorders of bone density and structure, unspecified site: Secondary | ICD-10-CM

## 2018-07-21 DIAGNOSIS — M85851 Other specified disorders of bone density and structure, right thigh: Secondary | ICD-10-CM | POA: Diagnosis not present

## 2018-07-21 DIAGNOSIS — M47816 Spondylosis without myelopathy or radiculopathy, lumbar region: Secondary | ICD-10-CM | POA: Diagnosis not present

## 2018-07-21 MED ORDER — RIVAROXABAN 20 MG PO TABS: 20.0000 mg | ORAL_TABLET | Freq: Every day | ORAL | 2 refills | Status: DC

## 2018-07-23 ENCOUNTER — Other Ambulatory Visit (HOSPITAL_COMMUNITY): Payer: Self-pay | Admitting: *Deleted

## 2018-07-23 DIAGNOSIS — Z7901 Long term (current) use of anticoagulants: Secondary | ICD-10-CM

## 2018-07-23 MED ORDER — RIVAROXABAN 20 MG PO TABS: 20.0000 mg | ORAL_TABLET | Freq: Every day | ORAL | 3 refills | Status: DC

## 2018-08-04 DIAGNOSIS — H401232 Low-tension glaucoma, bilateral, moderate stage: Secondary | ICD-10-CM | POA: Diagnosis not present

## 2018-08-07 ENCOUNTER — Other Ambulatory Visit: Payer: Self-pay | Admitting: Family Medicine

## 2018-08-14 ENCOUNTER — Encounter: Payer: Self-pay | Admitting: *Deleted

## 2018-08-21 ENCOUNTER — Other Ambulatory Visit (HOSPITAL_COMMUNITY): Payer: Medicare HMO

## 2018-08-21 ENCOUNTER — Other Ambulatory Visit: Payer: Self-pay

## 2018-08-21 ENCOUNTER — Encounter (HOSPITAL_COMMUNITY): Payer: Self-pay | Admitting: Hematology

## 2018-08-21 ENCOUNTER — Inpatient Hospital Stay (HOSPITAL_COMMUNITY): Payer: Medicare HMO

## 2018-08-21 ENCOUNTER — Inpatient Hospital Stay (HOSPITAL_COMMUNITY): Payer: Medicare HMO | Attending: Hematology | Admitting: Hematology

## 2018-08-21 DIAGNOSIS — Z79899 Other long term (current) drug therapy: Secondary | ICD-10-CM | POA: Diagnosis not present

## 2018-08-21 DIAGNOSIS — C50912 Malignant neoplasm of unspecified site of left female breast: Secondary | ICD-10-CM

## 2018-08-21 DIAGNOSIS — Z86711 Personal history of pulmonary embolism: Secondary | ICD-10-CM | POA: Diagnosis not present

## 2018-08-21 DIAGNOSIS — M8589 Other specified disorders of bone density and structure, multiple sites: Secondary | ICD-10-CM | POA: Diagnosis not present

## 2018-08-21 DIAGNOSIS — C773 Secondary and unspecified malignant neoplasm of axilla and upper limb lymph nodes: Secondary | ICD-10-CM | POA: Diagnosis not present

## 2018-08-21 DIAGNOSIS — Z7901 Long term (current) use of anticoagulants: Secondary | ICD-10-CM | POA: Diagnosis not present

## 2018-08-21 DIAGNOSIS — C50919 Malignant neoplasm of unspecified site of unspecified female breast: Secondary | ICD-10-CM

## 2018-08-21 DIAGNOSIS — I10 Essential (primary) hypertension: Secondary | ICD-10-CM

## 2018-08-21 DIAGNOSIS — E669 Obesity, unspecified: Secondary | ICD-10-CM

## 2018-08-21 DIAGNOSIS — M8580 Other specified disorders of bone density and structure, unspecified site: Secondary | ICD-10-CM

## 2018-08-21 DIAGNOSIS — Z79811 Long term (current) use of aromatase inhibitors: Secondary | ICD-10-CM | POA: Diagnosis not present

## 2018-08-21 DIAGNOSIS — E039 Hypothyroidism, unspecified: Secondary | ICD-10-CM | POA: Diagnosis not present

## 2018-08-21 LAB — CBC WITH DIFFERENTIAL/PLATELET
Abs Immature Granulocytes: 0.01 10*3/uL (ref 0.00–0.07)
Basophils Absolute: 0.1 10*3/uL (ref 0.0–0.1)
Basophils Relative: 1 %
EOS PCT: 5 %
Eosinophils Absolute: 0.2 10*3/uL (ref 0.0–0.5)
HCT: 41.2 % (ref 36.0–46.0)
Hemoglobin: 13 g/dL (ref 12.0–15.0)
Immature Granulocytes: 0 %
Lymphocytes Relative: 37 %
Lymphs Abs: 1.3 10*3/uL (ref 0.7–4.0)
MCH: 28.8 pg (ref 26.0–34.0)
MCHC: 31.6 g/dL (ref 30.0–36.0)
MCV: 91.2 fL (ref 80.0–100.0)
Monocytes Absolute: 0.5 10*3/uL (ref 0.1–1.0)
Monocytes Relative: 13 %
Neutro Abs: 1.6 10*3/uL — ABNORMAL LOW (ref 1.7–7.7)
Neutrophils Relative %: 44 %
PLATELETS: 175 10*3/uL (ref 150–400)
RBC: 4.52 MIL/uL (ref 3.87–5.11)
RDW: 14.8 % (ref 11.5–15.5)
WBC: 3.6 10*3/uL — ABNORMAL LOW (ref 4.0–10.5)
nRBC: 0 % (ref 0.0–0.2)

## 2018-08-21 LAB — COMPREHENSIVE METABOLIC PANEL
ALT: 15 U/L (ref 0–44)
AST: 21 U/L (ref 15–41)
Albumin: 4 g/dL (ref 3.5–5.0)
Alkaline Phosphatase: 37 U/L — ABNORMAL LOW (ref 38–126)
Anion gap: 7 (ref 5–15)
BUN: 14 mg/dL (ref 8–23)
CO2: 26 mmol/L (ref 22–32)
Calcium: 9.6 mg/dL (ref 8.9–10.3)
Chloride: 105 mmol/L (ref 98–111)
Creatinine, Ser: 0.71 mg/dL (ref 0.44–1.00)
GFR calc non Af Amer: >60 mL/min (ref >60)
Glucose, Bld: 102 mg/dL — ABNORMAL HIGH (ref 70–99)
Potassium: 3.9 mmol/L (ref 3.5–5.1)
SODIUM: 138 mmol/L (ref 135–145)
Total Bilirubin: 0.8 mg/dL (ref 0.3–1.2)
Total Protein: 7.4 g/dL (ref 6.5–8.1)

## 2018-08-21 MED ORDER — DENOSUMAB 60 MG/ML ~~LOC~~ SOSY
60.0000 mg | PREFILLED_SYRINGE | Freq: Once | SUBCUTANEOUS | Status: AC
  Administered 2018-08-21: 60 mg via SUBCUTANEOUS
  Filled 2018-08-21: qty 1

## 2018-08-21 NOTE — Progress Notes (Signed)
Patient taking calcium with vit d everyday. Patient taking arimidex every day. Patient due for Prolia today. Patient has not had any dental appts recently and is not scheduled for any upcoming dental appts.   Lisa Klein presented for labwork. Labs per MD order drawn via Peripheral Line 23 gauge needle inserted in right AC  Good blood return present. Procedure without incident.  Needle removed intact. Patient tolerated procedure well.

## 2018-08-21 NOTE — Assessment & Plan Note (Signed)
1.  Stage IIIa (T1 cN2 aM0) left breast IDC: -Status post left MRM on 02/14/2011, showing multifocal tumor, largest measuring 1.5 cm, grade 3, deep surgical margins positive, ER/PR positive, HER-2 negative, Ki-67 45%, 6 out of 7 lymph nodes positive.  Status post 6 cycles of epirubicin and Cytoxan, followed by radiation therapy - Anastrozole started on 11/19/2011 - Today's physical examination did not show any suspicious masses at the left mastectomy site.  Right breast has no palpable masses.  No palpable adenopathy. - She is tolerating anastrozole very well. - I have reviewed results of the mammogram dated 07/21/2018 of the right breast, BI-RADS Category 1. - She will come back in 6 months for follow-up.  She will continue anastrozole for 10 years.  2.  Osteopenia: -Initial DEXA scan on 07/12/2016 shows T score of -1.4.   -She was started on Prolia on 09/01/2014 which she is tolerating well. -We discussed the results of the DEXA scan dated 07/21/2018 which shows T score of -1.4. -She was told to continue calcium and vitamin D twice daily.  3.  Thromboembolism: - She had a right leg DVT in 2012.  Bilateral pulmonary embolism was diagnosed in 2014.  She is currently on Coumadin indefinitely.

## 2018-08-21 NOTE — Patient Instructions (Signed)
[facility] at [facility] Discharge Instructions  Follow up in 6 months with labs    Thank you for choosing [facility] at [facility] to provide your oncology and hematology care.  To afford each patient quality time with our provider, please arrive at least 15 minutes before your scheduled appointment time.   If you have a lab appointment with the Cancer Center please come in thru the  Main Entrance and check in at the main information desk  You need to re-schedule your appointment should you arrive 10 or more minutes late.  We strive to give you quality time with our providers, and arriving late affects you and other patients whose appointments are after yours.  Also, if you no show three or more times for appointments you may be dismissed from the clinic at the providers discretion.     Again, thank you for choosing [facility].  Our hope is that these requests will decrease the amount of time that you wait before being seen by our physicians.       _____________________________________________________________  Should you have questions after your visit to [facility], please contact our office at [phone] between the hours of 8:00 a.m. and 4:30 p.m.  Voicemails left after 4:00 p.m. will not be returned until the following business day.  For prescription refill requests, have your pharmacy contact our office and allow 72 hours.    Cancer Center Support Programs:   > Cancer Support Group  2nd Tuesday of the month 1pm-2pm, Journey Room    

## 2018-08-21 NOTE — Progress Notes (Signed)
Lisa Klein, Lisa Klein [id number]   CLINIC:  Medical Oncology/Hematology  PCP:  [name], MD [address], [address] [place] [address] [phone]   REASON FOR VISIT: Follow-up for infiltrating ductal carcinoma of the left breast  CURRENT THERAPY: Arimidex    CANCER STAGING: Cancer Staging Infiltrating ductal carcinoma of breast ([place]) Staging form: Breast, AJCC 7th Edition - Clinical: Stage IIIA (T1c, N2a, cM0) - Signed by Baird Cancer, PA on 04/18/2011    INTERVAL HISTORY:  Lisa Klein 85 y.o. female returns for routine follow-up for infiltrating ductal carcinoma of the left breast. Lisa Klein has been doing good since her last visit. Denies any nausea, vomiting, or diarrhea. Denies any new pains. Had not noticed any recent bleeding such as epistaxis, hematuria or hematochezia. Denies recent chest pain on exertion, shortness of breath on minimal exertion, pre-syncopal episodes, or palpitations. Denies any numbness or tingling in hands or feet. Denies any recent fevers, infections, or recent hospitalizations. Patient reports appetite at 100% and energy level at 50%.   REVIEW OF SYSTEMS:  Review of Systems  All other systems reviewed and are negative.    PAST MEDICAL/SURGICAL HISTORY:  Past Medical History:  Diagnosis Date  . Cancer ([place]) 2012   LEFT BREAST  . Clotting disorder ([facility]) 05/2011   dvt, right leg  . Degenerative disc disease    with nerve compression   . DJD (degenerative joint disease)   . Hypertension   . Hypertension   . Hypothyroidism   . Hypothyroidism   . Long term current use of anticoagulant therapy 12/07/2014   Pulmonary embolus   . Obesity   . Osteopenia 08/24/2014   Past Surgical History:  Procedure Laterality Date  . ABDOMINAL HYSTERECTOMY  1980   fibroids  . BREAST SURGERY  2012   left total mastectomy  . CHOLECYSTECTOMY  80'S   [facility]  . CHOLECYSTECTOMY    . HIP PINNING,CANNULATED  Left 01/19/2013   Procedure: CANNULATED HIP PINNING-  left;  Surgeon: Marin Shutter, MD;  [facility]: [facility];  Service: Orthopedics;  Laterality: Left;  [name] MASTECTOMY MODIFIED RADICAL  02/14/11   left  . PORT-A-CATH REMOVAL N/A 12/05/2012   Procedure: MINOR REMOVAL PORT-A-CATH;  Surgeon: Jamesetta So, MD;  Location: [facility];  Service: General;  Laterality: N/A;  In Minor Room  . PORTACATH PLACEMENT  04/16/2011   Procedure: INSERTION PORT-A-CATH;  Surgeon: Jamesetta So;  Location: [facility];  Service: General;  Laterality: Right;  right subclavian  . VESICOVAGINAL FISTULA CLOSURE W/ TAH     [facility]     SOCIAL HISTORY:  Social History   Socioeconomic History  . Marital status: Widowed    Spouse name: Not on file  . Number of children: 2  . Years of education: 16  . Highest education level: 12th grade  Occupational History  . Occupation: [employer] part time   . Occupation: retired/disabled   Social Needs  . Financial resource strain: Somewhat hard  . Food insecurity:    Worry: Never true    Inability: Never true  . Transportation needs:    Medical: No    Non-medical: No  Tobacco Use  . Smoking status: Never Smoker  . Smokeless tobacco: Never Used  Substance and Sexual Activity  . Alcohol use: No  . Drug use: No  . Sexual activity: Not Currently    Birth control/protection: Post-menopausal  Lifestyle  . Physical activity:  Days per week: 0 days    Minutes per session: 0 min  . Stress: Only a little  Relationships  . Social connections:    Talks on phone: Twice a week    Gets together: Once a week    Attends religious service: Never    Active member of club or organization: No    Attends meetings of clubs or organizations: Never    Relationship status: Widowed  . Intimate partner violence:    Fear of current or ex partner: No    Emotionally abused: No    Physically abused: No    Forced sexual activity: No  Other Topics Concern  . Not on file  Social History Narrative    Reports starting to have a lot of trouble doing things for herself. Lives alone, tries to drive to places, but it is getting increasingly harder to do.     FAMILY HISTORY:  Family History  Problem Relation Age of Onset  . COPD Father   . Lung disease Father   . Hypertension Sister   . Arthritis Mother   . Hypertension Sister   . Heart disease Son        stent  . Anesthesia problems Neg Hx   . Malignant hyperthermia Neg Hx   . Hypotension Neg Hx   . Pseudochol deficiency Neg Hx     CURRENT MEDICATIONS:  Outpatient Encounter Medications as of 08/21/2018  Medication Sig  . acetaminophen (TYLENOL) 650 MG CR tablet Take 650 mg by mouth 2 (two) times daily.   [name] anastrozole (ARIMIDEX) 1 MG tablet Take 1 tablet (1 mg total) by mouth at bedtime.  [name] aspirin EC 81 MG tablet Take 81 mg by mouth daily.  . calcium-vitamin D (OSCAL WITH D) 500-200 MG-UNIT tablet Take 1 tablet by mouth 2 (two) times daily.  . clotrimazole-betamethasone (LOTRISONE) cream Apply twice daily to rash on right knee  For 10 days ,then , as needed  . Cod Liver Oil 1000 MG CAPS Take 1 capsule by mouth daily.  [name] denosumab (PROLIA) 60 MG/ML SOLN injection Inject 60 mg into the skin every 6 (six) months. Administer in upper arm, thigh, or abdomen  . docusate sodium (COLACE) 100 MG capsule Take 100 mg by mouth daily as needed for constipation. For constipation  . hydrochlorothiazide (MICROZIDE) 12.5 MG capsule TAKE 1 CAPSULE EVERY MORNING  . levothyroxine (SYNTHROID, LEVOTHROID) 100 MCG tablet TAKE 1 TABLET EVERY MORNING  . lovastatin (MEVACOR) 20 MG tablet TAKE 1 TABLET AT BEDTIME  . potassium chloride (K-DUR) 10 MEQ tablet TAKE 1 TABLET TWICE DAILY  . pyridOXINE (VITAMIN B-6) 100 MG tablet Take 100 mg by mouth daily.  . rivaroxaban (XARELTO) 20 MG TABS tablet Take 1 tablet (20 mg total) by mouth daily with supper.  . vitamin E 1000 UNIT capsule Take 1,000 Units by mouth daily.  . [DISCONTINUED] rivaroxaban (XARELTO) 20 MG  TABS tablet Take 1 tablet (20 mg total) by mouth daily with supper.  . [DISCONTINUED] potassium chloride (K-DUR,KLOR-CON) 10 MEQ tablet TAKE 1 TABLET TWICE DAILY   Facility-Administered Encounter Medications as of 08/21/2018  Medication  . heparin lock flush 100 unit/mL  . heparin lock flush 100 unit/mL  . sodium chloride 0.9 % injection 10 mL  . sodium chloride 0.9 % injection 10 mL    ALLERGIES:  Allergies  Allergen Reactions  . Listerine [Antiseptic Mouth Rinse]     Caused sore throat  . Singulair [Montelukast Sodium] Other (See Comments)  Abdominal spasm      PHYSICAL EXAM:  ECOG Performance status: 1  Vitals:   08/21/18 0850  BP: 114/67  Pulse: 71  Resp: 20  Temp: 97.9 F (36.6 C)  SpO2: 100%   Filed Weights   08/21/18 0850  Weight: 186 lb 11.2 oz (84.7 kg)    Physical Exam Constitutional:      Appearance: Normal appearance.  Musculoskeletal:     Comments: wheelchair  Skin:    General: Skin is warm and dry.  Neurological:     Mental Status: Lisa Klein is alert and oriented to person, place, and time. Mental status is at baseline.  Psychiatric:        Mood and Affect: Mood normal.        Behavior: Behavior normal.        Thought Content: Thought content normal.        Judgment: Judgment normal.   Left mastectomy site is within normal limits.  Right breast has no palpable masses.  Palpable adenopathy.   LABORATORY DATA:  I have reviewed the labs as listed.  CBC    Component Value Date/Time   WBC 3.6 (L) 08/21/2018 0847   RBC 4.52 08/21/2018 0847   HGB 13.0 08/21/2018 0847   HCT 41.2 08/21/2018 0847   PLT 175 08/21/2018 0847   MCV 91.2 08/21/2018 0847   MCH 28.8 08/21/2018 0847   MCHC 31.6 08/21/2018 0847   RDW 14.8 08/21/2018 0847   LYMPHSABS 1.3 08/21/2018 0847   MONOABS 0.5 08/21/2018 0847   EOSABS 0.2 08/21/2018 0847   BASOSABS 0.1 08/21/2018 0847   CMP Latest Ref Rng & Units 08/21/2018 02/18/2018 10/30/2017  Glucose 70 - 99 mg/dL 102(H) 97 90    BUN 8 - 23 mg/dL '14 14 13  ' Creatinine 0.44 - 1.00 mg/dL 0.71 0.84 0.90(H)  Sodium 135 - 145 mmol/L 138 144 140  Potassium 3.5 - 5.1 mmol/L 3.9 3.8 3.9  Chloride 98 - 111 mmol/L 105 110 104  CO2 22 - 32 mmol/L '26 27 29  ' Calcium 8.9 - 10.3 mg/dL 9.6 9.0 9.2  Total Protein 6.5 - 8.1 g/dL 7.4 6.8 6.4  Total Bilirubin 0.3 - 1.2 mg/dL 0.8 0.8 0.6  Alkaline Phos 38 - 126 U/L 37(L) 36(L) -  AST 15 - 41 U/L '21 24 20  ' ALT 0 - 44 U/L '15 19 15       ' DIAGNOSTIC IMAGING:  I have independently reviewed the scans and discussed with the patient.   I have reviewed Francene Finders, NP's note and agree with the documentation.  I personally performed a face-to-face visit, made revisions and my assessment and plan is as follows.    ASSESSMENT & PLAN:   Infiltrating ductal carcinoma of breast 1.  Stage IIIa (T1 cN2 aM0) left breast IDC: -Status post left MRM on 02/14/2011, showing multifocal tumor, largest measuring 1.5 cm, grade 3, deep surgical margins positive, ER/PR positive, HER-2 negative, Ki-67 45%, 6 out of 7 lymph nodes positive.  Status post 6 cycles of epirubicin and Cytoxan, followed by radiation therapy - Anastrozole started on 11/19/2011 - Today's physical examination did not show any suspicious masses at the left mastectomy site.  Right breast has no palpable masses.  No palpable adenopathy. - Lisa Klein is tolerating anastrozole very well. - I have reviewed results of the mammogram dated 07/21/2018 of the right breast, BI-RADS Category 1. - Lisa Klein will come back in 6 months for follow-up.  Lisa Klein will continue anastrozole for 10 years.  2.  Osteopenia: -Initial DEXA scan on 07/12/2016 shows T score of -1.4.   -Lisa Klein was started on Prolia on 09/01/2014 which Lisa Klein is tolerating well. -We discussed the results of the DEXA scan dated 07/21/2018 which shows T score of -1.4. -Lisa Klein was told to continue calcium and vitamin D twice daily.  3.  Thromboembolism: - Lisa Klein had a right leg DVT in 2012.  Bilateral  pulmonary embolism was diagnosed in 2014.  Lisa Klein is currently on Coumadin indefinitely.      Orders placed this encounter:  Orders Placed This Encounter  Procedures  . VITAMIN D 25 Hydroxy (Vit-D Deficiency, Fractures)  . CBC with Differential/Platelet  . Comprehensive metabolic panel      Derek Jack, MD [place] [phone]

## 2018-08-22 LAB — VITAMIN D 25 HYDROXY (VIT D DEFICIENCY, FRACTURES): Vit D, 25-Hydroxy: 40.4 ng/mL (ref 30.0–100.0)

## 2018-10-31 ENCOUNTER — Encounter: Payer: Self-pay | Admitting: *Deleted

## 2018-11-04 ENCOUNTER — Encounter: Payer: Medicare HMO | Admitting: Family Medicine

## 2018-11-27 ENCOUNTER — Encounter: Payer: Self-pay | Admitting: *Deleted

## 2018-12-05 ENCOUNTER — Other Ambulatory Visit (HOSPITAL_COMMUNITY): Payer: Self-pay | Admitting: *Deleted

## 2018-12-05 DIAGNOSIS — C50912 Malignant neoplasm of unspecified site of left female breast: Secondary | ICD-10-CM

## 2018-12-05 MED ORDER — ANASTROZOLE 1 MG PO TABS: 1.0000 mg | ORAL_TABLET | Freq: Every day | ORAL | 2 refills | Status: DC

## 2018-12-17 ENCOUNTER — Other Ambulatory Visit: Payer: Self-pay | Admitting: Family Medicine

## 2019-01-20 ENCOUNTER — Encounter: Payer: Medicare HMO | Admitting: Family Medicine

## 2019-02-05 ENCOUNTER — Encounter: Payer: Self-pay | Admitting: Family Medicine

## 2019-02-05 ENCOUNTER — Ambulatory Visit (INDEPENDENT_AMBULATORY_CARE_PROVIDER_SITE_OTHER): Payer: Medicare HMO | Admitting: Family Medicine

## 2019-02-05 ENCOUNTER — Other Ambulatory Visit: Payer: Self-pay

## 2019-02-05 VITALS — BP 124/78 | HR 100 | Temp 97.9°F | Resp 15 | Ht 65.0 in | Wt 192.0 lb

## 2019-02-05 DIAGNOSIS — Z0000 Encounter for general adult medical examination without abnormal findings: Secondary | ICD-10-CM

## 2019-02-05 DIAGNOSIS — I10 Essential (primary) hypertension: Secondary | ICD-10-CM

## 2019-02-05 DIAGNOSIS — E7849 Other hyperlipidemia: Secondary | ICD-10-CM

## 2019-02-05 DIAGNOSIS — E039 Hypothyroidism, unspecified: Secondary | ICD-10-CM

## 2019-02-05 NOTE — Progress Notes (Signed)
    Lisa Klein     MRN: [id number]      DOB: 09-01-1929  HPI: Patient is in for annual physical exam. No other health concerns are expressed or addressed at the visit. Recent labs, if available are reviewed. Immunization is reviewed , and  updated if needed.   PE: BP 124/78   Pulse 100   Temp 97.9 F (36.6 C) (Temporal)   Resp 15   Ht 5\' 5"  (1.651 m)   Wt 192 lb (87.1 kg)   SpO2 97%   BMI 31.95 kg/m   Pleasant  female, alert and oriented x 3, in no cardio-pulmonary distress. Afebrile. HEENT No facial trauma or asymetry. Sinuses non tender.  Extra occullar muscles intact. External ears normal,  Oropharynx moist Neck: decreased ROM, no adenopathy,JVD or thyromegaly.No bruits.  Chest: Clear to ascultation bilaterally.No crackles or wheezes. Non tender to palpation  Breast: No exam done, no concerns, most recent mammogram done in 07/2018  Cardiovascular system; Heart sounds normal,  S1 and  S2 ,no S3.  No murmur, or thrill. Apical beat not displaced Peripheral pulses normal.  Abdomen: Soft, non tenderl. No guarding, tenderness or rebound.     Musculoskeletal exam: Decreased  ROM of spine, hips , shoulders and knees.  deformity ,swelling and  crepitus noted.  Neurologic: Cranial nerves 2 to 12 intact. Power, tone ,sensation normal throughout. Abnormal gait. No tremor.  Skin: Intact, no ulceration, erythema , scaling or rash noted. Pigmentation normal throughout  Psych; Normal mood and affect. Judgement and concentration normal   Assessment & Plan:   Annual physical exam Annual exam as documented.  Immunization and cancer screening needs are specifically addressed at this visit.

## 2019-02-05 NOTE — Patient Instructions (Addendum)
Fasting lipid, cmp and EGFR, TSH in the next 1 week  Keep wellness appointment in Novemebr  F/U with MD early January , call if you need me before  Be careful not to fall  Thanks for choosing [facility] we consider it a privelige to serve you.   Social distancing. Frequent hand washing with soap and water Keeping your hands off of your face. These 3 practices will help to keep both you and your community healthy during this time. Please practice them faithfully!

## 2019-02-07 ENCOUNTER — Encounter: Payer: Self-pay | Admitting: Family Medicine

## 2019-02-07 NOTE — Assessment & Plan Note (Signed)
Annual exam as documented. . Immunization and cancer screening needs are specifically addressed at this visit.  

## 2019-02-10 DIAGNOSIS — E7849 Other hyperlipidemia: Secondary | ICD-10-CM | POA: Diagnosis not present

## 2019-02-10 DIAGNOSIS — I10 Essential (primary) hypertension: Secondary | ICD-10-CM | POA: Diagnosis not present

## 2019-02-10 DIAGNOSIS — E039 Hypothyroidism, unspecified: Secondary | ICD-10-CM | POA: Diagnosis not present

## 2019-02-11 ENCOUNTER — Encounter: Payer: Self-pay | Admitting: *Deleted

## 2019-02-11 LAB — COMPLETE METABOLIC PANEL WITH GFR
AG Ratio: 1.5 (calc) (ref 1.0–2.5)
ALT: 15 U/L (ref 6–29)
AST: 19 U/L (ref 10–35)
Albumin: 3.9 g/dL (ref 3.6–5.1)
Alkaline phosphatase (APISO): 34 U/L — ABNORMAL LOW (ref 37–153)
BUN: 14 mg/dL (ref 7–25)
CO2: 27 mmol/L (ref 20–32)
Calcium: 9.6 mg/dL (ref 8.6–10.4)
Chloride: 107 mmol/L (ref 98–110)
Creat: 0.79 mg/dL (ref 0.60–0.88)
GFR, Est African American: 79 mL/min/{1.73_m2} (ref >=60)
GFR, Est Non African American: 68 mL/min/{1.73_m2} (ref >=60)
Globulin: 2.6 g/dL (calc) (ref 1.9–3.7)
Glucose, Bld: 94 mg/dL (ref 65–99)
Potassium: 4.1 mmol/L (ref 3.5–5.3)
Sodium: 140 mmol/L (ref 135–146)
Total Bilirubin: 0.6 mg/dL (ref 0.2–1.2)
Total Protein: 6.5 g/dL (ref 6.1–8.1)

## 2019-02-11 LAB — LIPID PANEL
Cholesterol: 157 mg/dL (ref <200)
HDL: 59 mg/dL (ref >=50)
LDL Cholesterol (Calc): 82 mg/dL (calc)
Non-HDL Cholesterol (Calc): 98 mg/dL (calc) (ref <130)
Total CHOL/HDL Ratio: 2.7 (calc) (ref <5.0)
Triglycerides: 77 mg/dL (ref <150)

## 2019-02-11 LAB — TSH: TSH: 2.22 mIU/L (ref 0.40–4.50)

## 2019-02-24 ENCOUNTER — Other Ambulatory Visit: Payer: Self-pay | Admitting: Family Medicine

## 2019-02-25 ENCOUNTER — Other Ambulatory Visit (HOSPITAL_COMMUNITY): Payer: Self-pay | Admitting: Hematology

## 2019-02-25 ENCOUNTER — Other Ambulatory Visit: Payer: Self-pay

## 2019-02-25 ENCOUNTER — Inpatient Hospital Stay (HOSPITAL_COMMUNITY): Payer: Medicare HMO

## 2019-02-25 ENCOUNTER — Inpatient Hospital Stay (HOSPITAL_COMMUNITY): Payer: Medicare HMO | Admitting: Hematology

## 2019-02-25 ENCOUNTER — Inpatient Hospital Stay (HOSPITAL_COMMUNITY): Payer: Medicare HMO | Attending: Hematology

## 2019-02-25 VITALS — BP 150/71 | HR 76 | Temp 97.9°F | Resp 18 | Wt 182.4 lb

## 2019-02-25 DIAGNOSIS — C50919 Malignant neoplasm of unspecified site of unspecified female breast: Secondary | ICD-10-CM

## 2019-02-25 DIAGNOSIS — I10 Essential (primary) hypertension: Secondary | ICD-10-CM

## 2019-02-25 DIAGNOSIS — C50412 Malignant neoplasm of upper-outer quadrant of left female breast: Secondary | ICD-10-CM

## 2019-02-25 DIAGNOSIS — Z825 Family history of asthma and other chronic lower respiratory diseases: Secondary | ICD-10-CM | POA: Diagnosis not present

## 2019-02-25 DIAGNOSIS — Z8249 Family history of ischemic heart disease and other diseases of the circulatory system: Secondary | ICD-10-CM | POA: Diagnosis not present

## 2019-02-25 DIAGNOSIS — Z86711 Personal history of pulmonary embolism: Secondary | ICD-10-CM | POA: Insufficient documentation

## 2019-02-25 DIAGNOSIS — Z170 Estrogen receptor positive status [ER+]: Secondary | ICD-10-CM

## 2019-02-25 DIAGNOSIS — Z86718 Personal history of other venous thrombosis and embolism: Secondary | ICD-10-CM

## 2019-02-25 DIAGNOSIS — Z79899 Other long term (current) drug therapy: Secondary | ICD-10-CM | POA: Diagnosis not present

## 2019-02-25 DIAGNOSIS — M8580 Other specified disorders of bone density and structure, unspecified site: Secondary | ICD-10-CM | POA: Insufficient documentation

## 2019-02-25 DIAGNOSIS — Z9012 Acquired absence of left breast and nipple: Secondary | ICD-10-CM | POA: Diagnosis not present

## 2019-02-25 DIAGNOSIS — Z7901 Long term (current) use of anticoagulants: Secondary | ICD-10-CM | POA: Diagnosis not present

## 2019-02-25 DIAGNOSIS — C50912 Malignant neoplasm of unspecified site of left female breast: Secondary | ICD-10-CM

## 2019-02-25 DIAGNOSIS — E039 Hypothyroidism, unspecified: Secondary | ICD-10-CM | POA: Diagnosis not present

## 2019-02-25 DIAGNOSIS — Z8261 Family history of arthritis: Secondary | ICD-10-CM | POA: Insufficient documentation

## 2019-02-25 DIAGNOSIS — M1990 Unspecified osteoarthritis, unspecified site: Secondary | ICD-10-CM | POA: Insufficient documentation

## 2019-02-25 DIAGNOSIS — Z1231 Encounter for screening mammogram for malignant neoplasm of breast: Secondary | ICD-10-CM

## 2019-02-25 LAB — COMPREHENSIVE METABOLIC PANEL
ALT: 23 U/L (ref 0–44)
AST: 29 U/L (ref 15–41)
Albumin: 3.8 g/dL (ref 3.5–5.0)
Alkaline Phosphatase: 37 U/L — ABNORMAL LOW (ref 38–126)
Anion gap: 7 (ref 5–15)
BUN: 14 mg/dL (ref 8–23)
CO2: 25 mmol/L (ref 22–32)
Calcium: 9.1 mg/dL (ref 8.9–10.3)
Chloride: 109 mmol/L (ref 98–111)
Creatinine, Ser: 0.92 mg/dL (ref 0.44–1.00)
GFR calc Af Amer: >60 mL/min (ref >60)
GFR calc non Af Amer: 56 mL/min — ABNORMAL LOW (ref >60)
Glucose, Bld: 101 mg/dL — ABNORMAL HIGH (ref 70–99)
Potassium: 3.9 mmol/L (ref 3.5–5.1)
Sodium: 141 mmol/L (ref 135–145)
Total Bilirubin: 0.8 mg/dL (ref 0.3–1.2)
Total Protein: 7.2 g/dL (ref 6.5–8.1)

## 2019-02-25 LAB — CBC WITH DIFFERENTIAL/PLATELET
Abs Immature Granulocytes: 0.02 10*3/uL (ref 0.00–0.07)
Basophils Absolute: 0.1 10*3/uL (ref 0.0–0.1)
Basophils Relative: 1 %
Eosinophils Absolute: 0.1 10*3/uL (ref 0.0–0.5)
Eosinophils Relative: 1 %
HCT: 40.2 % (ref 36.0–46.0)
Hemoglobin: 12.8 g/dL (ref 12.0–15.0)
Immature Granulocytes: 0 %
Lymphocytes Relative: 34 %
Lymphs Abs: 1.5 10*3/uL (ref 0.7–4.0)
MCH: 29 pg (ref 26.0–34.0)
MCHC: 31.8 g/dL (ref 30.0–36.0)
MCV: 91.2 fL (ref 80.0–100.0)
Monocytes Absolute: 0.7 10*3/uL (ref 0.1–1.0)
Monocytes Relative: 16 %
Neutro Abs: 2.1 10*3/uL (ref 1.7–7.7)
Neutrophils Relative %: 48 %
Platelets: 186 10*3/uL (ref 150–400)
RBC: 4.41 MIL/uL (ref 3.87–5.11)
RDW: 14.8 % (ref 11.5–15.5)
WBC: 4.5 10*3/uL (ref 4.0–10.5)
nRBC: 0 % (ref 0.0–0.2)

## 2019-02-25 MED ORDER — DENOSUMAB 60 MG/ML ~~LOC~~ SOLN: 60.0000 mg | Freq: Once | SUBCUTANEOUS | Status: DC

## 2019-02-25 MED ORDER — DENOSUMAB 60 MG/ML ~~LOC~~ SOSY
60.0000 mg | PREFILLED_SYRINGE | Freq: Once | SUBCUTANEOUS | Status: AC
  Administered 2019-02-25: 60 mg via SUBCUTANEOUS
  Filled 2019-02-25: qty 1

## 2019-02-25 NOTE — Progress Notes (Signed)
[place], [place] [address]   CLINIC:  Medical Oncology/Hematology  PCP:  [name], MD [address], [address] [place] [address] [phone]   REASON FOR VISIT:  Follow-up for Breast Cancer  CURRENT THERAPY: Anastrazole    CANCER STAGING: Cancer Staging Infiltrating ductal carcinoma of breast [facility]) Staging form: Breast, AJCC 7th Edition - Clinical: Stage IIIA (T1c, N2a, cM0) - Signed by Baird Cancer, PA on 04/18/2011    INTERVAL HISTORY:  Ms. Legore 85 y.o. female presents today for 53-monthfollow-up of breast cancer.  She reports overall doing well.  She is currently on anastrozole daily, tolerating well.  She denies any hot flashes.  Denies any bony pain.  She denies any changes to her right breast, no lumps, masses, nipple inversion or discharge.  She denies any further changes to her health history.   REVIEW OF SYSTEMS:  Review of Systems  Constitutional: Negative.   HENT:  Negative.   Eyes: Negative.   Respiratory: Negative.   Cardiovascular: Negative.   Gastrointestinal: Negative.   Endocrine: Negative.   Genitourinary: Negative.    Musculoskeletal: Positive for arthralgias and gait problem.  Skin: Negative.   Neurological: Positive for extremity weakness and gait problem.  Hematological: Negative.   Psychiatric/Behavioral: Negative.      PAST MEDICAL/SURGICAL HISTORY:  Past Medical History:  Diagnosis Date  . [place]   LEFT BREAST  . [place] 05[place]   dvt, right leg  . Degenerative disc disease    with nerve compression   . DJD (degenerative joint disease)   . Hypertension   . Hypertension   . Hypothyroidism   . Hypothyroidism   . Long term current use of anticoagulant therapy 12/07/2014   Pulmonary embolus   . Obesity   . Osteopenia 08/24/2014   Past Surgical History:  Procedure Laterality Date  . ABDOMINAL HYSTERECTOMY  1980   fibroids  . BREAST  SURGERY  2012   left total mastectomy  . CHOLECYSTECTOMY  80'S   [facility]  . CHOLECYSTECTOMY    . HIP PINNING,CANNULATED Left 01/19/2013   Procedure: CANNULATED HIP PINNING-  left;  Surgeon: KMarin Shutter MD;  [facility]  Service: Orthopedics;  Laterality: Left;  .Marland KitchenMASTECTOMY MODIFIED RADICAL  02/14/11   left  . PORT-A-CATH REMOVAL N/A 12/05/2012   Procedure: MINOR REMOVAL PORT-A-CATH;  Surgeon: MJamesetta So MD;  [facility];  Service: General;  Laterality: N/A;  In Minor Room  . PORTACATH PLACEMENT  04/16/2011   Procedure: INSERTION PORT-A-CATH;  Surgeon: MJamesetta So  Location: [facility];  Service: General;  Laterality: Right;  right subclavian  . VESICOVAGINAL FISTULA CLOSURE W/ TAH     [facility]     SOCIAL HISTORY:  Social History   Socioeconomic History  . Marital status: Widowed    Spouse name: Not on file  . Number of children: 2  . Years of education: 170 . Highest education level: 12th grade  Occupational History  . Occupation: [employer] part time   . Occupation: retired/disabled   Social Needs  . Financial resource strain: Somewhat hard  . Food insecurity    Worry: Never true    Inability: Never true  . Transportation needs    Medical: No    Non-medical: No  Tobacco Use  . Smoking status: Never Smoker  . Smokeless tobacco: Never Used  Substance and Sexual Activity  . Alcohol use: No  .  Drug use: No  . Sexual activity: Not Currently    Birth control/protection: Post-menopausal  Lifestyle  . Physical activity    Days per week: 0 days    Minutes per session: 0 min  . Stress: Only a little  Relationships  . Social Herbalist on phone: Twice a week    Gets together: Once a week    Attends religious service: Never    Active member of club or organization: No    Attends meetings of clubs or organizations: Never    Relationship status: Widowed  . Intimate partner violence    Fear of current or ex partner: No    Emotionally abused: No    Physically  abused: No    Forced sexual activity: No  Other Topics Concern  . Not on file  Social History Narrative   Reports starting to have a lot of trouble doing things for herself. Lives alone, tries to drive to places, but it is getting increasingly harder to do.     FAMILY HISTORY:  Family History  Problem Relation Age of Onset  . COPD Father   . Lung disease Father   . Hypertension Sister   . Arthritis Mother   . Hypertension Sister   . Heart disease Son        stent  . Anesthesia problems Neg Hx   . Malignant hyperthermia Neg Hx   . Hypotension Neg Hx   . Pseudochol deficiency Neg Hx     CURRENT MEDICATIONS:  Outpatient Encounter Medications as of 02/25/2019  Medication Sig  . acetaminophen (TYLENOL) 650 MG CR tablet Take 650 mg by mouth 2 (two) times daily.   [name] anastrozole (ARIMIDEX) 1 MG tablet Take 1 tablet (1 mg total) by mouth at bedtime.  [name] aspirin EC 81 MG tablet Take 81 mg by mouth daily.  . Biotin w/ Vitamins C & E (HAIR/SKIN/NAILS PO) Take by mouth.  . calcium-vitamin D (OSCAL WITH D) 500-200 MG-UNIT tablet Take 1 tablet by mouth 2 (two) times daily.  [name] denosumab (PROLIA) 60 MG/ML SOLN injection Inject 60 mg into the skin every 6 (six) months. Administer in upper arm, thigh, or abdomen  . docusate sodium (COLACE) 100 MG capsule Take 100 mg by mouth daily as needed for constipation. For constipation  . hydrochlorothiazide (MICROZIDE) 12.5 MG capsule TAKE 1 CAPSULE EVERY MORNING  . levothyroxine (SYNTHROID) 100 MCG tablet TAKE 1 TABLET EVERY MORNING  . lovastatin (MEVACOR) 20 MG tablet TAKE 1 TABLET AT BEDTIME  . potassium chloride (K-DUR) 10 MEQ tablet TAKE 1 TABLET TWICE DAILY  . potassium chloride (K-DUR) 10 MEQ tablet TAKE 1 TABLET TWICE DAILY  . pyridOXINE (VITAMIN B-6) 100 MG tablet Take 100 mg by mouth daily.  . rivaroxaban (XARELTO) 20 MG TABS tablet Take 1 tablet (20 mg total) by mouth daily with supper.  . vitamin E 1000 UNIT capsule Take 1,000 Units by mouth  daily.  [name] Cod Liver Oil 1000 MG CAPS Take 1 capsule by mouth daily.  . [DISCONTINUED] clotrimazole-betamethasone (LOTRISONE) cream Apply twice daily to rash on right knee  For 10 days ,then , as needed   Facility-Administered Encounter Medications as of 02/25/2019  Medication Note  . [COMPLETED] denosumab (PROLIA) injection 60 mg   . heparin lock flush 100 unit/mL   . heparin lock flush 100 unit/mL   . sodium chloride 0.9 % injection 10 mL   . sodium chloride 0.9 % injection 10 mL   . [  DISCONTINUED] denosumab (PROLIA) injection 60 mg 02/25/2019: new ERX    ALLERGIES:  Allergies  Allergen Reactions  . Listerine [Antiseptic Mouth Rinse]     Caused sore throat  . Singulair [Montelukast Sodium] Other (See Comments)    Abdominal spasm      PHYSICAL EXAM:  ECOG Performance status: 2  Vitals:   02/25/19 1326  BP: (!) 150/71  Pulse: 76  Resp: 18  Temp: 97.9 F (36.6 C)  SpO2: 98%   Filed Weights   02/25/19 1326  Weight: 182 lb 6.4 oz (82.7 kg)    Physical Exam Constitutional:      Appearance: Normal appearance. She is normal weight.  HENT:     Head: Normocephalic.     Nose: Nose normal.     Mouth/Throat:     Mouth: Mucous membranes are moist.     Pharynx: Oropharynx is clear.  Eyes:     Extraocular Movements: Extraocular movements intact.     Conjunctiva/sclera: Conjunctivae normal.  Neck:     Musculoskeletal: Normal range of motion.  Cardiovascular:     Rate and Rhythm: Normal rate and regular rhythm.     Pulses: Normal pulses.     Heart sounds: Normal heart sounds.  Pulmonary:     Effort: Pulmonary effort is normal.     Breath sounds: Normal breath sounds.  Abdominal:     General: Bowel sounds are normal.     Palpations: Abdomen is soft.  Musculoskeletal: Normal range of motion.  Skin:    General: Skin is warm.  Neurological:     General: No focal deficit present.     Mental Status: She is alert and oriented to person, place, and time. Mental status is at  baseline.  Psychiatric:        Mood and Affect: Mood normal.        Behavior: Behavior normal.        Thought Content: Thought content normal.        Judgment: Judgment normal.      LABORATORY DATA:  I have reviewed the labs as listed.  CBC    Component Value Date/Time   WBC 4.5 02/25/2019 1206   RBC 4.41 02/25/2019 1206   HGB 12.8 02/25/2019 1206   HCT 40.2 02/25/2019 1206   PLT 186 02/25/2019 1206   MCV 91.2 02/25/2019 1206   MCH 29.0 02/25/2019 1206   MCHC 31.8 02/25/2019 1206   RDW 14.8 02/25/2019 1206   LYMPHSABS 1.5 02/25/2019 1206   MONOABS 0.7 02/25/2019 1206   EOSABS 0.1 02/25/2019 1206   BASOSABS 0.1 02/25/2019 1206   CMP Latest Ref Rng & Units 02/25/2019 02/10/2019 08/21/2018  Glucose 70 - 99 mg/dL 101(H) 94 102(H)  BUN 8 - 23 mg/dL '14 14 14  ' Creatinine 0.44 - 1.00 mg/dL 0.92 0.79 0.71  Sodium 135 - 145 mmol/L 141 140 138  Potassium 3.5 - 5.1 mmol/L 3.9 4.1 3.9  Chloride 98 - 111 mmol/L 109 107 105  CO2 22 - 32 mmol/L '25 27 26  ' Calcium 8.9 - 10.3 mg/dL 9.1 9.6 9.6  Total Protein 6.5 - 8.1 g/dL 7.2 6.5 7.4  Total Bilirubin 0.3 - 1.2 mg/dL 0.8 0.6 0.8  Alkaline Phos 38 - 126 U/L 37(L) - 37(L)  AST 15 - 41 U/L '29 19 21  ' ALT 0 - 44 U/L '23 15 15       ' ASSESSMENT & PLAN:   Infiltrating ductal carcinoma of breast 1.  Stage IIIa (T1 cN2 aM0) left  breast IDC: -Status post left MRM on 02/14/2011, showing multifocal tumor, largest measuring 1.5 cm, grade 3, deep surgical margins positive, ER/PR positive, HER-2 negative, Ki-67 45%, 6 out of 7 lymph nodes positive.  Status post 6 cycles of epirubicin and Cytoxan, followed by radiation therapy - Anastrozole started on 11/19/2011. She will continue for ten years.  - Today's physical examination did not show any suspicious masses at the left mastectomy site.  Right breast has no palpable masses.  No palpable adenopathy. - Recommend continuing anastrazole, she is tolerating  Well. - Plan to repeat screening mammogram in  December.  She will return to clinic in 6 months.   2.  Osteopenia: -Initial DEXA scan on 07/12/2016 shows T score of -1.4.   -She was started on Prolia on 09/01/2014 which she is tolerating well. -We discussed the results of the DEXA scan dated 07/21/2018 which shows T score of -1.4. -She was told to continue calcium and vitamin D twice daily.  3.  Thromboembolism: - She had a right leg DVT in 2012.  Bilateral pulmonary embolism was diagnosed in 2014.  She is currently on Xarelto indefinitely.      Orders placed this encounter:  Orders Placed This Encounter  Procedures  . CBC with Differential  . Comprehensive metabolic panel  . Vitamin D 25 hydroxy      Roger Shelter, [place] [phone]

## 2019-02-25 NOTE — Assessment & Plan Note (Signed)
1.  Stage IIIa (T1 cN2 aM0) left breast IDC: -Status post left MRM on 02/14/2011, showing multifocal tumor, largest measuring 1.5 cm, grade 3, deep surgical margins positive, ER/PR positive, HER-2 negative, Ki-67 45%, 6 out of 7 lymph nodes positive.  Status post 6 cycles of epirubicin and Cytoxan, followed by radiation therapy - Anastrozole started on 11/19/2011. She will continue for ten years.  - Today's physical examination did not show any suspicious masses at the left mastectomy site.  Right breast has no palpable masses.  No palpable adenopathy. - Recommend continuing anastrazole, she is tolerating  Well. - Plan to repeat screening mammogram in December.  She will return to clinic in 6 months.   2.  Osteopenia: -Initial DEXA scan on 07/12/2016 shows T score of -1.4.   -She was started on Prolia on 09/01/2014 which she is tolerating well. -We discussed the results of the DEXA scan dated 07/21/2018 which shows T score of -1.4. -She was told to continue calcium and vitamin D twice daily.  3.  Thromboembolism: - She had a right leg DVT in 2012.  Bilateral pulmonary embolism was diagnosed in 2014.  She is currently on Xarelto indefinitely.

## 2019-02-26 LAB — VITAMIN D 25 HYDROXY (VIT D DEFICIENCY, FRACTURES): Vit D, 25-Hydroxy: 39.6 ng/mL (ref 30.0–100.0)

## 2019-03-03 DIAGNOSIS — H2513 Age-related nuclear cataract, bilateral: Secondary | ICD-10-CM | POA: Diagnosis not present

## 2019-03-03 DIAGNOSIS — H25013 Cortical age-related cataract, bilateral: Secondary | ICD-10-CM | POA: Diagnosis not present

## 2019-03-03 DIAGNOSIS — H401232 Low-tension glaucoma, bilateral, moderate stage: Secondary | ICD-10-CM | POA: Diagnosis not present

## 2019-03-03 DIAGNOSIS — H35033 Hypertensive retinopathy, bilateral: Secondary | ICD-10-CM | POA: Diagnosis not present

## 2019-04-17 ENCOUNTER — Other Ambulatory Visit: Payer: Self-pay | Admitting: Family Medicine

## 2019-05-18 ENCOUNTER — Other Ambulatory Visit: Payer: Self-pay

## 2019-05-18 MED ORDER — LEVOTHYROXINE SODIUM 100 MCG PO TABS: 100.0000 ug | ORAL_TABLET | Freq: Every morning | ORAL | 0 refills | Status: DC

## 2019-06-11 ENCOUNTER — Ambulatory Visit (INDEPENDENT_AMBULATORY_CARE_PROVIDER_SITE_OTHER): Payer: Medicare HMO

## 2019-06-11 ENCOUNTER — Other Ambulatory Visit: Payer: Self-pay

## 2019-06-11 DIAGNOSIS — Z23 Encounter for immunization: Secondary | ICD-10-CM | POA: Diagnosis not present

## 2019-06-12 ENCOUNTER — Other Ambulatory Visit: Payer: Self-pay

## 2019-06-12 MED ORDER — LOVASTATIN 20 MG PO TABS: 20.0000 mg | ORAL_TABLET | Freq: Every day | ORAL | 0 refills | Status: DC

## 2019-06-12 MED ORDER — POTASSIUM CHLORIDE ER 10 MEQ PO TBCR: 10.0000 meq | EXTENDED_RELEASE_TABLET | Freq: Two times a day (BID) | ORAL | 1 refills | Status: DC

## 2019-06-16 ENCOUNTER — Encounter: Payer: Self-pay | Admitting: Family Medicine

## 2019-06-16 ENCOUNTER — Ambulatory Visit (INDEPENDENT_AMBULATORY_CARE_PROVIDER_SITE_OTHER): Payer: Medicare HMO | Admitting: Family Medicine

## 2019-06-16 ENCOUNTER — Other Ambulatory Visit: Payer: Self-pay

## 2019-06-16 VITALS — BP 150/71 | HR 76 | Resp 15 | Ht 65.0 in | Wt 182.0 lb

## 2019-06-16 DIAGNOSIS — Z0000 Encounter for general adult medical examination without abnormal findings: Secondary | ICD-10-CM

## 2019-06-16 NOTE — Patient Instructions (Signed)
Lisa Klein , Thank you for taking time to come for your Medicare Wellness Visit. I appreciate your ongoing commitment to your health goals. Please review the following plan we discussed and let me know if I can assist you in the future.   Please continue to practice social distancing to keep you, your family, and our community safe. If you must go out, please wear a Mask and practice good handwashing.  HAPPY EARLY BIRTHDAY !!!   We hope you have a happy, safe, and healthy Holiday Season. We look forward to seeing you in the New Year!   Screening recommendations/referrals: Colonoscopy: No longer needed Mammogram: Up to date Bone Density: Up to date-Continue to get Calcium and Vitamin D  Recommended yearly ophthalmology/optometry visit for glaucoma screening and checkup Recommended yearly dental visit for hygiene and checkup  Vaccinations: Influenza vaccine: Completed, Due Fall 2021 Pneumococcal vaccine: Completed Tdap vaccine: Due 2022 Shingles vaccine: Completed  Advanced directives: Let us know if we can assist you in anyway.   Conditions/risks identified: Fall  Next appointment: 08/10/2019   Preventive Care 83 Years and Older, Female Preventive care refers to lifestyle choices and visits with your health care provider that can promote health and wellness. What does preventive care include?  A yearly physical exam. This is also called an annual well check.  Dental exams once or twice a year.  Routine eye exams. Ask your health care provider how often you should have your eyes checked.  Personal lifestyle choices, including:  Daily care of your teeth and gums.  Regular physical activity.  Eating a healthy diet.  Avoiding tobacco and drug use.  Limiting alcohol use.  Practicing safe sex.  Taking low-dose aspirin every day.  Taking vitamin and mineral supplements as recommended by your health care provider. What happens during an annual well check? The services  and screenings done by your health care provider during your annual well check will depend on your age, overall health, lifestyle risk factors, and family history of disease. Counseling  Your health care provider may ask you questions about your:  Alcohol use.  Tobacco use.  Drug use.  Emotional well-being.  Home and relationship well-being.  Sexual activity.  Eating habits.  History of falls.  Memory and ability to understand (cognition).  Work and work Statistician.  Reproductive health. Screening  You may have the following tests or measurements:  Height, weight, and BMI.  Blood pressure.  Lipid and cholesterol levels. These may be checked every 5 years, or more frequently if you are over 105 years old.  Skin check.  Lung cancer screening. You may have this screening every year starting at age 50 if you have a 30-pack-year history of smoking and currently smoke or have quit within the past 15 years.  Fecal occult blood test (FOBT) of the stool. You may have this test every year starting at age 41.  Flexible sigmoidoscopy or colonoscopy. You may have a sigmoidoscopy every 5 years or a colonoscopy every 10 years starting at age 41.  Hepatitis C blood test.  Hepatitis B blood test.  Sexually transmitted disease (STD) testing.  Diabetes screening. This is done by checking your blood sugar (glucose) after you have not eaten for a while (fasting). You may have this done every 1-3 years.  Bone density scan. This is done to screen for osteoporosis. You may have this done starting at age 74.  Mammogram. This may be done every 1-2 years. Talk to your health care provider  about how often you should have regular mammograms. Talk with your health care provider about your test results, treatment options, and if necessary, the need for more tests. Vaccines  Your health care provider may recommend certain vaccines, such as:  Influenza vaccine. This is recommended every year.   Tetanus, diphtheria, and acellular pertussis (Tdap, Td) vaccine. You may need a Td booster every 10 years.  Zoster vaccine. You may need this after age 70.  Pneumococcal 13-valent conjugate (PCV13) vaccine. One dose is recommended after age 67.  Pneumococcal polysaccharide (PPSV23) vaccine. One dose is recommended after age 101. Talk to your health care provider about which screenings and vaccines you need and how often you need them. This information is not intended to replace advice given to you by your health care provider. Make sure you discuss any questions you have with your health care provider. Document Released: 08/19/2015 Document Revised: 04/11/2016 Document Reviewed: 05/24/2015 Elsevier Interactive Patient Education  2017 [place] Prevention in the Home Falls can cause injuries. They can happen to people of all ages. There are many things you can do to make your home safe and to help prevent falls. What can I do on the outside of my home?  Regularly fix the edges of walkways and driveways and fix any cracks.  Remove anything that might make you trip as you walk through a door, such as a raised step or threshold.  Trim any bushes or trees on the path to your home.  Use bright outdoor lighting.  Clear any walking paths of anything that might make someone trip, such as rocks or tools.  Regularly check to see if handrails are loose or broken. Make sure that both sides of any steps have handrails.  Any raised decks and porches should have guardrails on the edges.  Have any leaves, snow, or ice cleared regularly.  Use sand or salt on walking paths during winter.  Clean up any spills in your garage right away. This includes oil or grease spills. What can I do in the bathroom?  Use night lights.  Install grab bars by the toilet and in the tub and shower. Do not use towel bars as grab bars.  Use non-skid mats or decals in the tub or shower.  If you need to  sit down in the shower, use a plastic, non-slip stool.  Keep the floor dry. Clean up any water that spills on the floor as soon as it happens.  Remove soap buildup in the tub or shower regularly.  Attach bath mats securely with double-sided non-slip rug tape.  Do not have throw rugs and other things on the floor that can make you trip. What can I do in the bedroom?  Use night lights.  Make sure that you have a light by your bed that is easy to reach.  Do not use any sheets or blankets that are too big for your bed. They should not hang down onto the floor.  Have a firm chair that has side arms. You can use this for support while you get dressed.  Do not have throw rugs and other things on the floor that can make you trip. What can I do in the kitchen?  Clean up any spills right away.  Avoid walking on wet floors.  Keep items that you use a lot in easy-to-reach places.  If you need to reach something above you, use a strong step stool that has a grab bar.  Keep electrical cords out of the way.  Do not use floor polish or wax that makes floors slippery. If you must use wax, use non-skid floor wax.  Do not have throw rugs and other things on the floor that can make you trip. What can I do with my stairs?  Do not leave any items on the stairs.  Make sure that there are handrails on both sides of the stairs and use them. Fix handrails that are broken or loose. Make sure that handrails are as long as the stairways.  Check any carpeting to make sure that it is firmly attached to the stairs. Fix any carpet that is loose or worn.  Avoid having throw rugs at the top or bottom of the stairs. If you do have throw rugs, attach them to the floor with carpet tape.  Make sure that you have a light switch at the top of the stairs and the bottom of the stairs. If you do not have them, ask someone to add them for you. What else can I do to help prevent falls?  Wear shoes that:  Do not  have high heels.  Have rubber bottoms.  Are comfortable and fit you well.  Are closed at the toe. Do not wear sandals.  If you use a stepladder:  Make sure that it is fully opened. Do not climb a closed stepladder.  Make sure that both sides of the stepladder are locked into place.  Ask someone to hold it for you, if possible.  Clearly mark and make sure that you can see:  Any grab bars or handrails.  First and last steps.  Where the edge of each step is.  Use tools that help you move around (mobility aids) if they are needed. These include:  Canes.  Walkers.  Scooters.  Crutches.  Turn on the lights when you go into a dark area. Replace any light bulbs as soon as they burn out.  Set up your furniture so you have a clear path. Avoid moving your furniture around.  If any of your floors are uneven, fix them.  If there are any pets around you, be aware of where they are.  Review your medicines with your doctor. Some medicines can make you feel dizzy. This can increase your chance of falling. Ask your doctor what other things that you can do to help prevent falls. This information is not intended to replace advice given to you by your health care provider. Make sure you discuss any questions you have with your health care provider. Document Released: 05/19/2009 Document Revised: 12/29/2015 Document Reviewed: 08/27/2014 Elsevier Interactive Patient Education  2017 [employer].

## 2019-06-16 NOTE — Progress Notes (Signed)
Subjective:   Lisa Klein is a 85 y.o. female who presents for Medicare Annual (Subsequent) preventive examination.  Location of Patient: Home Location of Provider: Telehealth Consent was obtain for visit to be over via telehealth.  I verified that I am speaking with the correct person using two identifiers.  Review of Systems:    Cardiac Risk Factors include: advanced age (>99men, >36 women);hypertension;dyslipidemia;obesity (BMI >30kg/m2)     Objective:     Vitals: BP (!) 150/71   Pulse 76   Resp 15   Ht 5\' 5"  (1.651 m)   Wt 182 lb (82.6 kg)   BMI 30.29 kg/m   Body mass index is 30.29 kg/m.  Advanced Directives 02/25/2019 08/21/2018 06/12/2018 02/18/2018 08/21/2017 05/29/2017 02/22/2017  Does Patient Have a Medical Advance Directive? No No No No No No No  Would patient like information on creating a medical advance directive? No - Patient declined No - Patient declined Yes (ED - Information included in AVS) No - Patient declined No - Patient declined Yes (MAU/Ambulatory/Procedural Areas - Information given) Yes (MAU/Ambulatory/Procedural Areas - Information given)  Pre-existing out of facility DNR order (yellow form or pink MOST form) - - - - - - -    Tobacco Social History   Tobacco Use  Smoking Status Never Smoker  Smokeless Tobacco Never Used     Counseling given: Yes   Clinical Intake:  Pre-visit preparation completed: Yes  Pain : No/denies pain Pain Score: 0-No pain     BMI - recorded: 30.29 Nutritional Status: BMI > 30  Obese Nutritional Risks: None Diabetes: No  How often do you need to have someone help you when you read instructions, pamphlets, or other written materials from your doctor or pharmacy?: 1 - Never What is the last grade level you completed in school?: 12  Interpreter Needed?: No     Past Medical History:  Diagnosis Date  . Cancer ([place]) 2012   LEFT BREAST  . Clotting disorder ([place]) 05/2011   dvt, right leg  . Degenerative  disc disease    with nerve compression   . DJD (degenerative joint disease)   . Hypertension   . Hypertension   . Hypothyroidism   . Hypothyroidism   . Long term current use of anticoagulant therapy 12/07/2014   Pulmonary embolus   . Obesity   . Osteopenia 08/24/2014   Past Surgical History:  Procedure Laterality Date  . ABDOMINAL HYSTERECTOMY  1980   fibroids  . BREAST SURGERY  2012   left total mastectomy  . CHOLECYSTECTOMY  80'S   [facility]  . CHOLECYSTECTOMY    . HIP PINNING,CANNULATED Left 01/19/2013   Procedure: CANNULATED HIP PINNING-  left;  Surgeon: Marin Shutter, MD;  Location: [facility];  Service: Orthopedics;  Laterality: Left;  [name] MASTECTOMY MODIFIED RADICAL  02/14/11   left  . PORT-A-CATH REMOVAL N/A 12/05/2012   Procedure: MINOR REMOVAL PORT-A-CATH;  Surgeon: Jamesetta So, MD;  Location: [facility];  Service: General;  Laterality: N/A;  In Minor Room  . PORTACATH PLACEMENT  04/16/2011   Procedure: INSERTION PORT-A-CATH;  Surgeon: Jamesetta So;  Location: [facility];  Service: General;  Laterality: Right;  right subclavian  . VESICOVAGINAL FISTULA CLOSURE W/ TAH     [facility]   Family History  Problem Relation Age of Onset  . COPD Father   . Lung disease Father   . Hypertension Sister   . Arthritis Mother   . Hypertension Sister   .  Heart disease Son        stent  . Anesthesia problems Neg Hx   . Malignant hyperthermia Neg Hx   . Hypotension Neg Hx   . Pseudochol deficiency Neg Hx    Social History   Socioeconomic History  . Marital status: Widowed    Spouse name: Not on file  . Number of children: 2  . Years of education: 37  . Highest education level: 12th grade  Occupational History  . Occupation: [employer] part time   . Occupation: retired/disabled   Social Needs  . Financial resource strain: Somewhat hard  . Food insecurity    Worry: Never true    Inability: Never true  . Transportation needs    Medical: No    Non-medical: No  Tobacco Use  . Smoking status: Never  Smoker  . Smokeless tobacco: Never Used  Substance and Sexual Activity  . Alcohol use: No  . Drug use: No  . Sexual activity: Not Currently    Birth control/protection: Post-menopausal  Lifestyle  . Physical activity    Days per week: 0 days    Minutes per session: 0 min  . Stress: Only a little  Relationships  . Social Herbalist on phone: Twice a week    Gets together: Once a week    Attends religious service: Never    Active member of club or organization: No    Attends meetings of clubs or organizations: Never    Relationship status: Widowed  Other Topics Concern  . Not on file  Social History Narrative   Reports starting to have a lot of trouble doing things for herself. Lives alone, tries to drive to places, but it is getting increasingly harder to do.     Outpatient Encounter Medications as of 06/16/2019  Medication Sig  . acetaminophen (TYLENOL) 650 MG CR tablet Take 650 mg by mouth 2 (two) times daily.   [name] anastrozole (ARIMIDEX) 1 MG tablet Take 1 tablet (1 mg total) by mouth at bedtime.  [name] aspirin EC 81 MG tablet Take 81 mg by mouth daily.  . Biotin w/ Vitamins C & E (HAIR/SKIN/NAILS PO) Take by mouth.  . calcium-vitamin D (OSCAL WITH D) 500-200 MG-UNIT tablet Take 1 tablet by mouth 2 (two) times daily.  [name] Cod Liver Oil 1000 MG CAPS Take 1 capsule by mouth daily.  [name] denosumab (PROLIA) 60 MG/ML SOLN injection Inject 60 mg into the skin every 6 (six) months. Administer in upper arm, thigh, or abdomen  . docusate sodium (COLACE) 100 MG capsule Take 100 mg by mouth daily as needed for constipation. For constipation  . hydrochlorothiazide (MICROZIDE) 12.5 MG capsule TAKE 1 CAPSULE EVERY MORNING  . levothyroxine (SYNTHROID) 100 MCG tablet Take 1 tablet (100 mcg total) by mouth every morning.  . lovastatin (MEVACOR) 20 MG tablet Take 1 tablet (20 mg total) by mouth at bedtime.  . potassium chloride (K-DUR) 10 MEQ tablet TAKE 1 TABLET TWICE DAILY  . potassium  chloride (KLOR-CON) 10 MEQ tablet Take 1 tablet (10 mEq total) by mouth 2 (two) times daily.  [name] pyridOXINE (VITAMIN B-6) 100 MG tablet Take 100 mg by mouth daily.  . rivaroxaban (XARELTO) 20 MG TABS tablet Take 1 tablet (20 mg total) by mouth daily with supper.  . vitamin E 1000 UNIT capsule Take 1,000 Units by mouth daily.   Facility-Administered Encounter Medications as of 06/16/2019  Medication  . heparin lock flush 100 unit/mL  . heparin  lock flush 100 unit/mL  . sodium chloride 0.9 % injection 10 mL  . sodium chloride 0.9 % injection 10 mL    Activities of Daily Living In your present state of health, do you have any difficulty performing the following activities: 06/16/2019  Hearing? N  Vision? N  Difficulty concentrating or making decisions? Y  Comment just a little bit  Walking or climbing stairs? Y  Dressing or bathing? N  Doing errands, shopping? Y  Preparing Food and eating ? N  Using the Toilet? N  In the past six months, have you accidently leaked urine? N  Do you have problems with loss of bowel control? N  Managing your Medications? N  Managing your Finances? N  Housekeeping or managing your Housekeeping? N  Some recent data might be hidden    Patient Care Team: [name], MD as PCP - General Twana First, MD as Consulting Physician (Oncology) Sheldon Silvan, Scharlene Corn as Physician Assistant (Oncology)    Assessment:   This is a routine wellness examination for [place].  Exercise Activities and Dietary recommendations Current Exercise Habits: Home exercise routine, Type of exercise: Other - see comments;calisthenics;stretching;walking(yard work), Time (Minutes): 10, Frequency (Times/Week): 3, Weekly Exercise (Minutes/Week): 30, Intensity: Mild, Exercise limited by: None identified  Goals    . Exercise 3x per week (30 min per time)     Starting today 07/25/2016 patient would like to increase exercise to at least 30 minutes 3 times a week.     [name]  LIFESTYLE - DECREASE FALLS RISK       Fall Risk Fall Risk  06/16/2019 02/05/2019 06/12/2018 10/30/2017 05/29/2017  Falls in the past year? 0 1 1 No No  Number falls in past yr: 0 0 0 - -  Injury with Fall? 0 0 0 - -  Risk Factor Category  - - - - -  Risk for fall due to : - - History of fall(s);Medication side effect;Impaired balance/gait;Impaired mobility - -  Follow up - - Falls prevention discussed - -   Is the patient's home free of loose throw rugs in walkways, pet beds, electrical cords, etc?   yes      Grab bars in the bathroom? yes      Handrails on the stairs?   yes      Adequate lighting?   yes     Depression Screen PHQ 2/9 Scores 06/16/2019 02/05/2019 06/12/2018 05/29/2017  PHQ - 2 Score 0 0 0 1     Cognitive Function     6CIT Screen 06/16/2019 06/12/2018 05/29/2017 07/25/2016  What Year? 0 points 0 points 0 points 0 points  What month? 0 points 0 points 0 points 0 points  What time? 0 points 0 points 0 points 0 points  Count back from 20 0 points 0 points 0 points 0 points  Months in reverse 0 points 0 points 0 points 0 points  Repeat phrase 0 points 0 points 0 points 0 points  Total Score 0 0 0 0    Immunization History  Administered Date(s) Administered  . Fluad Quad(high Dose 65+) 06/11/2019  . Influenza Whole 06/06/2009, 05/17/2010  . Influenza,inj,Quad PF,6+ Mos 06/29/2013, 07/15/2014, 05/02/2015, 07/25/2016, 04/18/2017, 04/02/2018  . Pneumococcal Conjugate-13 03/03/2014  . Pneumococcal Polysaccharide-23 08/07/2001  . Td 02/29/2004  . Tdap 11/13/2010  . Zoster 10/07/2006    Qualifies for Shingles Vaccine? completed  Screening Tests Health Maintenance  Topic Date Due  . TETANUS/TDAP  11/12/2020  . INFLUENZA VACCINE  Completed  . DEXA SCAN  Completed  . PNA vac Low Risk Adult  Completed    Cancer Screenings: Lung: Low Dose CT Chest recommended if Age 39-80 years, 30 pack-year currently smoking OR have quit w/in 15years. Patient does not qualify.  Breast:  Up to date on Mammogram? Yes   Up to date of Bone Density/Dexa? Yes Colorectal:  No longer need   Additional Screenings:  : Hepatitis C Screening:      Plan:       1. Encounter for Medicare annual wellness exam  I have personally reviewed and noted the following in the patient's chart:   . Medical and social history . Use of alcohol, tobacco or illicit drugs  . Current medications and supplements . Functional ability and status . Nutritional status . Physical activity . Advanced directives . List of other physicians . Hospitalizations, surgeries, and ER visits in previous 12 months . Vitals . Screenings to include cognitive, depression, and falls . Referrals and appointments  In addition, I have reviewed and discussed with patient certain preventive protocols, quality metrics, and best practice recommendations. A written personalized care plan for preventive services as well as general preventive health recommendations were provided to patient.    I provided 20 minutes of non-face-to-face time during this encounter.    Perlie Mayo, NP  06/16/2019

## 2019-06-17 ENCOUNTER — Ambulatory Visit: Payer: Medicare HMO

## 2019-06-23 ENCOUNTER — Other Ambulatory Visit (HOSPITAL_COMMUNITY): Payer: Self-pay | Admitting: Hematology

## 2019-06-23 DIAGNOSIS — Z7901 Long term (current) use of anticoagulants: Secondary | ICD-10-CM

## 2019-06-26 DIAGNOSIS — H04123 Dry eye syndrome of bilateral lacrimal glands: Secondary | ICD-10-CM | POA: Diagnosis not present

## 2019-06-26 DIAGNOSIS — H401232 Low-tension glaucoma, bilateral, moderate stage: Secondary | ICD-10-CM | POA: Diagnosis not present

## 2019-06-29 ENCOUNTER — Ambulatory Visit: Payer: Medicare HMO

## 2019-07-20 ENCOUNTER — Other Ambulatory Visit (HOSPITAL_COMMUNITY): Payer: Self-pay | Admitting: Hematology

## 2019-07-20 DIAGNOSIS — C50912 Malignant neoplasm of unspecified site of left female breast: Secondary | ICD-10-CM

## 2019-08-01 ENCOUNTER — Other Ambulatory Visit: Payer: Self-pay | Admitting: Family Medicine

## 2019-08-10 ENCOUNTER — Ambulatory Visit (INDEPENDENT_AMBULATORY_CARE_PROVIDER_SITE_OTHER): Payer: Medicare HMO | Admitting: Family Medicine

## 2019-08-10 ENCOUNTER — Ambulatory Visit (HOSPITAL_COMMUNITY)
Admission: RE | Admit: 2019-08-10 | Discharge: 2019-08-10 | Disposition: A | Discharge status: Home / Self Care | Payer: Medicare HMO | Source: Ambulatory Visit | Attending: Hematology | Admitting: Hematology

## 2019-08-10 ENCOUNTER — Encounter: Payer: Self-pay | Admitting: Family Medicine

## 2019-08-10 ENCOUNTER — Other Ambulatory Visit: Payer: Self-pay

## 2019-08-10 VITALS — BP 150/71 | HR 76 | Resp 18 | Ht 65.0 in | Wt 182.0 lb

## 2019-08-10 DIAGNOSIS — Z1231 Encounter for screening mammogram for malignant neoplasm of breast: Secondary | ICD-10-CM | POA: Insufficient documentation

## 2019-08-10 DIAGNOSIS — E785 Hyperlipidemia, unspecified: Secondary | ICD-10-CM | POA: Diagnosis not present

## 2019-08-10 DIAGNOSIS — M8580 Other specified disorders of bone density and structure, unspecified site: Secondary | ICD-10-CM | POA: Diagnosis not present

## 2019-08-10 DIAGNOSIS — C50919 Malignant neoplasm of unspecified site of unspecified female breast: Secondary | ICD-10-CM | POA: Diagnosis not present

## 2019-08-10 DIAGNOSIS — C50912 Malignant neoplasm of unspecified site of left female breast: Secondary | ICD-10-CM

## 2019-08-10 DIAGNOSIS — M159 Polyosteoarthritis, unspecified: Secondary | ICD-10-CM | POA: Diagnosis not present

## 2019-08-10 DIAGNOSIS — E039 Hypothyroidism, unspecified: Secondary | ICD-10-CM | POA: Diagnosis not present

## 2019-08-10 DIAGNOSIS — Z7901 Long term (current) use of anticoagulants: Secondary | ICD-10-CM

## 2019-08-10 DIAGNOSIS — I10 Essential (primary) hypertension: Secondary | ICD-10-CM

## 2019-08-10 DIAGNOSIS — Z79899 Other long term (current) drug therapy: Secondary | ICD-10-CM

## 2019-08-10 NOTE — Patient Instructions (Addendum)
Annual Physical exam in office wiith mD  july 5 or after, call if you need me sooner  Please get lipid, cmp and eGFr, [facility] January [facility] at cancer center , [facility], we will mail you the order. You may drink but no food or oily foods before the lab draw  It is important that you exercise regularly at least 30 minutes 5 times a week. If you develop chest pain, have severe difficulty breathing, or feel very tired, stop exercising immediately and seek medical attention   Think about what you will eat, plan ahead. Choose " clean, green, fresh or frozen" over canned, processed or packaged foods which are more sugary, salty and fatty. 70 to 75% of food eaten should be vegetables and fruit. Three meals at set times with snacks allowed between meals, but they must be fruit or vegetables. Aim to eat over a 12 hour period , example 7 am to 7 pm, and STOP after  your last meal of the day. Drink water,generally about 64 ounces per day, no other drink is as healthy. Fruit juice is best enjoyed in a healthy way, by EATING the fruit. Thanks for choosing [facility], we consider it a privelige to serve you.  BEST for 2021!

## 2019-08-10 NOTE — Progress Notes (Signed)
Virtual Visit via Telephone Note  I connected with Lisa Klein on 08/10/19 at  1:20 PM EST by telephone and verified that I am speaking with the correct person using two identifiers.  Location: Patient: home Provider: office   I discussed the limitations, risks, security and privacy concerns of performing an evaluation and management service by telephone and the availability of in person appointments. I also discussed with the patient that there may be a patient responsible charge related to this service. The patient expressed understanding and agreed to proceed.   History of Present Illness: F/U chronic problems and medication management. Feels well, denies falls, reports good appetite, regular BM, no recent fever chills or symptoms of respiratory infection. Denies recent fever or chills. Denies sinus pressure, nasal congestion, ear pain or sore throat. Denies chest congestion, productive cough or wheezing. Denies chest pains, palpitations and leg swelling Denies abdominal pain, nausea, vomiting,diarrhea or constipation.   Denies dysuria, frequency, hesitancy or incontinence. Chronic  joint pain, swelling and limitation in mobility. Denies headaches, seizures, numbness, or tingling. Denies depression, anxiety or insomnia. Denies skin break down or rash.       Observations/Objective: BP (!) 150/71   Pulse 76   Resp 18   Ht 5\' 5"  (1.651 m)   Wt 182 lb (82.6 kg)   BMI 30.29 kg/m  Good communication with no confusion and intact memory. Alert and oriented x 3 No signs of respiratory distress during speech    Assessment and Plan:  Essential hypertension Elevated sBP, woill re evaluate at next visit. No med change DASH diet and commitment to daily physical activity for a minimum of 30 minutes discussed and encouraged, as a part of hypertension management. The importance of attaining a healthy weight is also discussed.  BP/Weight 08/10/2019 06/16/2019 02/25/2019 02/05/2019  08/21/2018 06/12/2018 0000000  Systolic BP Q000111Q Q000111Q Q000111Q [id number]  Diastolic BP 71 71 71 78 67 80 74  Wt. (Lbs) 182 182 182.4 192 186.7 189 189  BMI 30.29 30.29 30.35 31.95 31.07 31.45 31.45       Hypothyroidism Updated lab needed at/ before next visit.  Controlled when last tested  Hyperlipemia Hyperlipidemia:Low fat diet discussed and encouraged.   Lipid Panel  Lab Results  Component Value Date   CHOL 157 02/10/2019   HDL 59 02/10/2019   LDLCALC 82 02/10/2019   TRIG 77 02/10/2019   CHOLHDL 2.7 02/10/2019   Controlled, no change in medication Updated lab needed at/ before next visit.     Long term current use of anticoagulant therapy Denies hematuria, epistaxis or BRRB  Osteopenia Treated  With prolia  Generalized osteoarthritis of multiple sites Home safety and fall risk reduction discussed  Infiltrating ductal carcinoma of breast Currently taking arimidex, manage by Oncology   Follow Up Instructions:    I discussed the assessment and treatment plan with the patient. The patient was provided an opportunity to ask questions and all were answered. The patient agreed with the plan and demonstrated an understanding of the instructions.   The patient was advised to call back or seek an in-person evaluation if the symptoms worsen or if the condition fails to improve as anticipated.  I provided 22 minutes of non-face-to-face time during this encounter.   [name], MD

## 2019-08-13 ENCOUNTER — Other Ambulatory Visit: Payer: Self-pay | Admitting: Family Medicine

## 2019-08-14 ENCOUNTER — Encounter: Payer: Self-pay | Admitting: Family Medicine

## 2019-08-14 NOTE — Assessment & Plan Note (Signed)
Currently taking arimidex, manage by Oncology

## 2019-08-14 NOTE — Assessment & Plan Note (Signed)
Home safety and fall risk reduction discussed 

## 2019-08-14 NOTE — Assessment & Plan Note (Signed)
Elevated sBP, woill re evaluate at next visit. No med change DASH diet and commitment to daily physical activity for a minimum of 30 minutes discussed and encouraged, as a part of hypertension management. The importance of attaining a healthy weight is also discussed.  BP/Weight 08/10/2019 06/16/2019 02/25/2019 02/05/2019 08/21/2018 06/12/2018 0000000  Systolic BP Q000111Q Q000111Q Q000111Q [id number]  Diastolic BP 71 71 71 78 67 80 74  Wt. (Lbs) 182 182 182.4 192 186.7 189 189  BMI 30.29 30.29 30.35 31.95 31.07 31.45 31.45

## 2019-08-14 NOTE — Assessment & Plan Note (Signed)
Treated  With prolia

## 2019-08-14 NOTE — Assessment & Plan Note (Signed)
Denies hematuria, epistaxis or BRRB

## 2019-08-14 NOTE — Assessment & Plan Note (Signed)
Hyperlipidemia:Low fat diet discussed and encouraged.   Lipid Panel  Lab Results  Component Value Date   CHOL 157 02/10/2019   HDL 59 02/10/2019   LDLCALC 82 02/10/2019   TRIG 77 02/10/2019   CHOLHDL 2.7 02/10/2019   Controlled, no change in medication Updated lab needed at/ before next visit.

## 2019-08-14 NOTE — Assessment & Plan Note (Signed)
Updated lab needed at/ before next visit.  Controlled when last tested

## 2019-08-18 ENCOUNTER — Encounter: Payer: Self-pay | Admitting: General Practice

## 2019-08-18 NOTE — Progress Notes (Signed)
[name]  Enrolled in Senior Meal program w [employer], will receive 6 months of meals delivered.  Khaleb Broz, LCSW Clinical Social Worker Phone:  [phone] Cell:  [phone]  

## 2019-09-02 ENCOUNTER — Other Ambulatory Visit (HOSPITAL_COMMUNITY): Payer: Medicare HMO

## 2019-09-02 ENCOUNTER — Ambulatory Visit (HOSPITAL_COMMUNITY): Payer: Medicare HMO

## 2019-09-02 ENCOUNTER — Ambulatory Visit (HOSPITAL_COMMUNITY): Payer: Medicare HMO | Admitting: Hematology

## 2019-09-07 ENCOUNTER — Other Ambulatory Visit (HOSPITAL_COMMUNITY): Payer: Self-pay | Admitting: *Deleted

## 2019-09-07 DIAGNOSIS — C50912 Malignant neoplasm of unspecified site of left female breast: Secondary | ICD-10-CM

## 2019-09-07 DIAGNOSIS — M8580 Other specified disorders of bone density and structure, unspecified site: Secondary | ICD-10-CM

## 2019-09-07 DIAGNOSIS — C50919 Malignant neoplasm of unspecified site of unspecified female breast: Secondary | ICD-10-CM

## 2019-09-08 ENCOUNTER — Inpatient Hospital Stay (HOSPITAL_COMMUNITY): Payer: Medicare HMO | Admitting: Nurse Practitioner

## 2019-09-08 ENCOUNTER — Inpatient Hospital Stay (HOSPITAL_COMMUNITY): Payer: Medicare HMO

## 2019-09-08 ENCOUNTER — Other Ambulatory Visit (HOSPITAL_COMMUNITY)
Admission: RE | Admit: 2019-09-08 | Discharge: 2019-09-08 | Disposition: A | Discharge status: Home / Self Care | Payer: Medicare HMO | Source: Ambulatory Visit | Attending: Family Medicine | Admitting: Family Medicine

## 2019-09-08 ENCOUNTER — Inpatient Hospital Stay (HOSPITAL_COMMUNITY): Payer: Medicare HMO | Attending: Hematology

## 2019-09-08 ENCOUNTER — Other Ambulatory Visit: Payer: Self-pay

## 2019-09-08 VITALS — BP 135/74 | HR 92 | Temp 97.5°F | Resp 18 | Wt 193.0 lb

## 2019-09-08 DIAGNOSIS — Z8249 Family history of ischemic heart disease and other diseases of the circulatory system: Secondary | ICD-10-CM | POA: Insufficient documentation

## 2019-09-08 DIAGNOSIS — C50912 Malignant neoplasm of unspecified site of left female breast: Secondary | ICD-10-CM

## 2019-09-08 DIAGNOSIS — Z79811 Long term (current) use of aromatase inhibitors: Secondary | ICD-10-CM | POA: Diagnosis not present

## 2019-09-08 DIAGNOSIS — Z86711 Personal history of pulmonary embolism: Secondary | ICD-10-CM | POA: Insufficient documentation

## 2019-09-08 DIAGNOSIS — M8580 Other specified disorders of bone density and structure, unspecified site: Secondary | ICD-10-CM | POA: Diagnosis not present

## 2019-09-08 DIAGNOSIS — E039 Hypothyroidism, unspecified: Secondary | ICD-10-CM | POA: Insufficient documentation

## 2019-09-08 DIAGNOSIS — M1990 Unspecified osteoarthritis, unspecified site: Secondary | ICD-10-CM | POA: Diagnosis not present

## 2019-09-08 DIAGNOSIS — Z86718 Personal history of other venous thrombosis and embolism: Secondary | ICD-10-CM | POA: Insufficient documentation

## 2019-09-08 DIAGNOSIS — Z79899 Other long term (current) drug therapy: Secondary | ICD-10-CM | POA: Diagnosis not present

## 2019-09-08 DIAGNOSIS — Z7901 Long term (current) use of anticoagulants: Secondary | ICD-10-CM | POA: Diagnosis not present

## 2019-09-08 DIAGNOSIS — Z170 Estrogen receptor positive status [ER+]: Secondary | ICD-10-CM | POA: Diagnosis not present

## 2019-09-08 DIAGNOSIS — C50919 Malignant neoplasm of unspecified site of unspecified female breast: Secondary | ICD-10-CM

## 2019-09-08 DIAGNOSIS — Z7982 Long term (current) use of aspirin: Secondary | ICD-10-CM | POA: Diagnosis not present

## 2019-09-08 DIAGNOSIS — I10 Essential (primary) hypertension: Secondary | ICD-10-CM | POA: Insufficient documentation

## 2019-09-08 DIAGNOSIS — E785 Hyperlipidemia, unspecified: Secondary | ICD-10-CM | POA: Diagnosis not present

## 2019-09-08 DIAGNOSIS — Z8261 Family history of arthritis: Secondary | ICD-10-CM | POA: Diagnosis not present

## 2019-09-08 DIAGNOSIS — R110 Nausea: Secondary | ICD-10-CM | POA: Diagnosis not present

## 2019-09-08 DIAGNOSIS — R5383 Other fatigue: Secondary | ICD-10-CM | POA: Diagnosis not present

## 2019-09-08 LAB — COMPREHENSIVE METABOLIC PANEL
ALT: 16 U/L (ref 0–44)
AST: 22 U/L (ref 15–41)
Albumin: 3.8 g/dL (ref 3.5–5.0)
Alkaline Phosphatase: 38 U/L (ref 38–126)
Anion gap: 9 (ref 5–15)
BUN: 13 mg/dL (ref 8–23)
CO2: 26 mmol/L (ref 22–32)
Calcium: 9.2 mg/dL (ref 8.9–10.3)
Chloride: 105 mmol/L (ref 98–111)
Creatinine, Ser: 0.78 mg/dL (ref 0.44–1.00)
GFR calc Af Amer: >60 mL/min (ref >60)
GFR calc non Af Amer: >60 mL/min (ref >60)
Glucose, Bld: 86 mg/dL (ref 70–99)
Potassium: 3.6 mmol/L (ref 3.5–5.1)
Sodium: 140 mmol/L (ref 135–145)
Total Bilirubin: 0.8 mg/dL (ref 0.3–1.2)
Total Protein: 6.9 g/dL (ref 6.5–8.1)

## 2019-09-08 LAB — CBC WITH DIFFERENTIAL/PLATELET
Abs Immature Granulocytes: 0.01 10*3/uL (ref 0.00–0.07)
Basophils Absolute: 0.1 10*3/uL (ref 0.0–0.1)
Basophils Relative: 1 %
Eosinophils Absolute: 0.1 10*3/uL (ref 0.0–0.5)
Eosinophils Relative: 2 %
HCT: 40.5 % (ref 36.0–46.0)
Hemoglobin: 12.9 g/dL (ref 12.0–15.0)
Immature Granulocytes: 0 %
Lymphocytes Relative: 30 %
Lymphs Abs: 1.4 10*3/uL (ref 0.7–4.0)
MCH: 29.6 pg (ref 26.0–34.0)
MCHC: 31.9 g/dL (ref 30.0–36.0)
MCV: 92.9 fL (ref 80.0–100.0)
Monocytes Absolute: 0.6 10*3/uL (ref 0.1–1.0)
Monocytes Relative: 14 %
Neutro Abs: 2.3 10*3/uL (ref 1.7–7.7)
Neutrophils Relative %: 53 %
Platelets: 184 10*3/uL (ref 150–400)
RBC: 4.36 MIL/uL (ref 3.87–5.11)
RDW: 15.1 % (ref 11.5–15.5)
WBC: 4.5 10*3/uL (ref 4.0–10.5)
nRBC: 0 % (ref 0.0–0.2)

## 2019-09-08 LAB — LIPID PANEL
Cholesterol: 149 mg/dL (ref 0–200)
HDL: 57 mg/dL (ref >40)
LDL Cholesterol: 81 mg/dL (ref 0–99)
Total CHOL/HDL Ratio: 2.6 RATIO
Triglycerides: 55 mg/dL (ref <150)
VLDL: 11 mg/dL (ref 0–40)

## 2019-09-08 LAB — TSH: TSH: 3.38 u[IU]/mL (ref 0.350–4.500)

## 2019-09-08 LAB — VITAMIN D 25 HYDROXY (VIT D DEFICIENCY, FRACTURES): Vit D, 25-Hydroxy: 38.33 ng/mL (ref 30–100)

## 2019-09-08 MED ORDER — ANASTROZOLE 1 MG PO TABS: 1.0000 mg | ORAL_TABLET | Freq: Every day | ORAL | 3 refills | Status: DC

## 2019-09-08 MED ORDER — DENOSUMAB 60 MG/ML ~~LOC~~ SOSY: 60.0000 mg | PREFILLED_SYRINGE | Freq: Once | SUBCUTANEOUS | Status: DC

## 2019-09-08 MED ORDER — DENOSUMAB 60 MG/ML ~~LOC~~ SOSY
PREFILLED_SYRINGE | SUBCUTANEOUS | Status: AC
  Filled 2019-09-08: qty 1

## 2019-09-08 MED ORDER — DENOSUMAB 60 MG/ML ~~LOC~~ SOLN: 60.0000 mg | Freq: Once | SUBCUTANEOUS | Status: DC

## 2019-09-08 NOTE — Assessment & Plan Note (Signed)
1.  Stage IIIa ([id number]) left breast IDC: -Status post left MRM on 02/14/2011, showing multifocal tumor, largest measuring 1.5 cm, grade 3, deep surgical margins positive, ER/PR positive, HER-2 negative, Ki 67 45%, 6 out of 7 lymph nodes positive.  Status post 6 cycles of epirubicin and Cytoxan, followed by radiation therapy. -Anastrozole started on 11/19/2011.  She will complete for 10 years. -Today's physical examination not show any suspicious masses at the left mastectomy site.  Right breast has no palpable masses.  No adenopathy. -Last mammogram on 08/10/2019 was BI-RADS Category 1 negative. -She will follow up in 6 months with repeat labs  2.  Osteopenia: -Initial DEXA scan on 07/12/2016 showed T score of -1.4. -She was started on Prolia on 09/01/2014 which she is tolerating well. -DEXA scan dated 07/21/2018 showed T score of -1.4. -She was told to take calcium and vitamin D twice daily. -She will receive her Prolia injection today on 09/08/2019.  3.  Thromboembolism: -She had a right leg DVT in 2012. -Bilateral pulmonary embolism was diagnosed in 2014. -She is currently on Xarelto indefinitely.

## 2019-09-08 NOTE — Progress Notes (Signed)
Prolia 60 mg given per orders. Patient tolerated it well without problems. Vitals stable and discharged home from clinic ambulatory. Follow up as scheduled.

## 2019-09-08 NOTE — Patient Instructions (Signed)
[facility] at [facility] Discharge Instructions  Follow up in 6 months with labs    Thank you for choosing [facility] at [facility] to provide your oncology and hematology care.  To afford each patient quality time with our provider, please arrive at least 15 minutes before your scheduled appointment time.   If you have a lab appointment with the Cancer Center please come in thru the Main Entrance and check in at the main information desk.  You need to re-schedule your appointment should you arrive 10 or more minutes late.  We strive to give you quality time with our providers, and arriving late affects you and other patients whose appointments are after yours.  Also, if you no show three or more times for appointments you may be dismissed from the clinic at the providers discretion.     Again, thank you for choosing [facility].  Our hope is that these requests will decrease the amount of time that you wait before being seen by our physicians.       _____________________________________________________________  Should you have questions after your visit to [facility], please contact our office at [phone] between the hours of 8:00 a.m. and 4:30 p.m.  Voicemails left after 4:00 p.m. will not be returned until the following business day.  For prescription refill requests, have your pharmacy contact our office and allow 72 hours.    Due to Covid, you will need to wear a mask upon entering the hospital. If you do not have a mask, a mask will be given to you at the Main Entrance upon arrival. For doctor visits, patients may have 1 support person with them. For treatment visits, patients can not have anyone with them due to social distancing guidelines and our immunocompromised population.      

## 2019-09-08 NOTE — Progress Notes (Signed)
[place], [place] [address]   CLINIC:  Medical Oncology/Hematology  PCP:  [name], MD [address] [place] [address] [phone]   REASON FOR VISIT: Follow-up for breast cancer  CURRENT THERAPY: Anastrozole   CANCER STAGING: Cancer Staging Infiltrating ductal carcinoma of breast ([place]) Staging form: Breast, AJCC 7th Edition - Clinical: Stage IIIA (T1c, N2a, cM0) - Signed by [facility], PA on 04/18/2011    INTERVAL HISTORY:  Lisa Klein 86 y.o. female returns for routine follow-up for breast cancer.  Patient reports she is taking her anastrozole as prescribed daily.  She is having no side effects from it.  She denies any new bone pain.  She denies any new extreme fatigue.  She denies any new lumps or bumps that she has felt in her breast. Denies any nausea, vomiting, or diarrhea. Denies any new pains. Had not noticed any recent bleeding such as epistaxis, hematuria or hematochezia. Denies recent chest pain on exertion, shortness of breath on minimal exertion, pre-syncopal episodes, or palpitations. Denies any numbness or tingling in hands or feet. Denies any recent fevers, infections, or recent hospitalizations. Patient reports appetite at 100% and energy level at 25%.  She is eating well and maintaining her weight at this time.  Patient reports she still cooks and cleans for herself at home.    REVIEW OF SYSTEMS:  Review of Systems  Constitutional: Positive for fatigue.  Gastrointestinal: Positive for nausea.  All other systems reviewed and are negative.    PAST MEDICAL/SURGICAL HISTORY:  Past Medical History:  Diagnosis Date  . Cancer ([place]) 2012   LEFT BREAST  . Clotting disorder ([place]) 05/2011   dvt, right leg  . Degenerative disc disease    with nerve compression   . DJD (degenerative joint disease)   . Hypertension   . Hypertension   . Hypothyroidism   . Hypothyroidism   . Long term  current use of anticoagulant therapy 12/07/2014   Pulmonary embolus   . Obesity   . Osteopenia 08/24/2014   Past Surgical History:  Procedure Laterality Date  . ABDOMINAL HYSTERECTOMY  1980   fibroids  . BREAST SURGERY  2012   left total mastectomy  . CHOLECYSTECTOMY  80'S   [facility]  . CHOLECYSTECTOMY    . HIP PINNING,CANNULATED Left 01/19/2013   Procedure: CANNULATED HIP PINNING-  left;  Surgeon: Marin Shutter, MD;  Location: [facility];  Service: Orthopedics;  Laterality: Left;  [name] MASTECTOMY MODIFIED RADICAL  02/14/11   left  . PORT-A-CATH REMOVAL N/A 12/05/2012   Procedure: MINOR REMOVAL PORT-A-CATH;  Surgeon: Jamesetta So, MD;  Location: [facility];  Service: General;  Laterality: N/A;  In Minor Room  . PORTACATH PLACEMENT  04/16/2011   Procedure: INSERTION PORT-A-CATH;  Surgeon: Jamesetta So;  Location: [facility];  Service: General;  Laterality: Right;  right subclavian  . VESICOVAGINAL FISTULA CLOSURE W/ TAH     [facility]     SOCIAL HISTORY:  Social History   Socioeconomic History  . Marital status: Widowed    Spouse name: Not on file  . Number of children: 2  . Years of education: 69  . Highest education level: 12th grade  Occupational History  . Occupation: [employer] part time   . Occupation: retired/disabled   Tobacco Use  . Smoking status: Never Smoker  . Smokeless tobacco: Never Used  Substance and Sexual Activity  . Alcohol use: No  .  Drug use: No  . Sexual activity: Not Currently    Birth control/protection: Post-menopausal  Other Topics Concern  . Not on file  Social History Narrative   Reports starting to have a lot of trouble doing things for herself. Lives alone, tries to drive to places, but it is getting increasingly harder to do.    Social Determinants of Health   Financial Resource Strain:   . Difficulty of Paying Living Expenses: Not on file  Food Insecurity:   . Worried About Charity fundraiser in the Last Year: Not on file  . Ran Out of Food in the Last Year: Not  on file  Transportation Needs:   . Lack of Transportation (Medical): Not on file  . Lack of Transportation (Non-Medical): Not on file  Physical Activity:   . Days of Exercise per Week: Not on file  . Minutes of Exercise per Session: Not on file  Stress:   . Feeling of Stress : Not on file  Social Connections:   . Frequency of Communication with Friends and Family: Not on file  . Frequency of Social Gatherings with Friends and Family: Not on file  . Attends Religious Services: Not on file  . Active Member of Clubs or Organizations: Not on file  . Attends Archivist Meetings: Not on file  . Marital Status: Not on file  Intimate Partner Violence:   . Fear of Current or Ex-Partner: Not on file  . Emotionally Abused: Not on file  . Physically Abused: Not on file  . Sexually Abused: Not on file    FAMILY HISTORY:  Family History  Problem Relation Age of Onset  . COPD Father   . Lung disease Father   . Hypertension Sister   . Arthritis Mother   . Hypertension Sister   . Heart disease Son        stent  . Anesthesia problems Neg Hx   . Malignant hyperthermia Neg Hx   . Hypotension Neg Hx   . Pseudochol deficiency Neg Hx     CURRENT MEDICATIONS:  Outpatient Encounter Medications as of 09/08/2019  Medication Sig  . acetaminophen (TYLENOL) 650 MG CR tablet Take 650 mg by mouth 2 (two) times daily.   [name] anastrozole (ARIMIDEX) 1 MG tablet Take 1 tablet (1 mg total) by mouth at bedtime.  [name] aspirin EC 81 MG tablet Take 81 mg by mouth daily.  . Biotin w/ Vitamins C & E (HAIR/SKIN/NAILS PO) Take by mouth.  . calcium-vitamin D (OSCAL WITH D) 500-200 MG-UNIT tablet Take 1 tablet by mouth 2 (two) times daily.  [name] Cod Liver Oil 1000 MG CAPS Take 1 capsule by mouth daily.  [name] denosumab (PROLIA) 60 MG/ML SOLN injection Inject 60 mg into the skin every 6 (six) months. Administer in upper arm, thigh, or abdomen  . hydrochlorothiazide (MICROZIDE) 12.5 MG capsule TAKE 1 CAPSULE EVERY  MORNING  . levothyroxine (SYNTHROID) 100 MCG tablet TAKE 1 TABLET EVERY MORNING  . lovastatin (MEVACOR) 20 MG tablet TAKE 1 TABLET (20 MG TOTAL) BY MOUTH AT BEDTIME.  [name] potassium chloride (KLOR-CON) 10 MEQ tablet Take 1 tablet (10 mEq total) by mouth 2 (two) times daily.  [name] pyridOXINE (VITAMIN B-6) 100 MG tablet Take 100 mg by mouth daily.  . vitamin E 1000 UNIT capsule Take 1,000 Units by mouth daily.  Alveda Reasons 20 MG TABS tablet TAKE 1 TABLET (20 MG TOTAL) BY MOUTH DAILY WITH SUPPER.  . [DISCONTINUED] anastrozole (ARIMIDEX) 1 MG  tablet TAKE 1 TABLET (1 MG TOTAL) BY MOUTH AT BEDTIME.  [name] docusate sodium (COLACE) 100 MG capsule Take 100 mg by mouth daily as needed for constipation. For constipation  . [DISCONTINUED] potassium chloride (K-DUR) 10 MEQ tablet TAKE 1 TABLET TWICE DAILY   Facility-Administered Encounter Medications as of 09/08/2019  Medication  . heparin lock flush 100 unit/mL  . heparin lock flush 100 unit/mL  . sodium chloride 0.9 % injection 10 mL  . sodium chloride 0.9 % injection 10 mL    ALLERGIES:  Allergies  Allergen Reactions  . Listerine [Antiseptic Mouth Rinse]     Caused sore throat  . Singulair [Montelukast Sodium] Other (See Comments)    Abdominal spasm      PHYSICAL EXAM:  ECOG Performance status: 1  Vitals:   09/08/19 1337  BP: 135/74  Pulse: 92  Resp: 18  Temp: (!) 97.5 F (36.4 C)  SpO2: 98%   Filed Weights   09/08/19 1337  Weight: 193 lb (87.5 kg)    Physical Exam Constitutional:      Appearance: Normal appearance. She is normal weight.  Cardiovascular:     Rate and Rhythm: Normal rate and regular rhythm.     Heart sounds: Normal heart sounds.  Pulmonary:     Effort: Pulmonary effort is normal.     Breath sounds: Normal breath sounds.  Abdominal:     General: Bowel sounds are normal.     Palpations: Abdomen is soft.  Musculoskeletal:        General: Normal range of motion.  Skin:    General: Skin is warm.  Neurological:      Mental Status: She is alert and oriented to person, place, and time. Mental status is at baseline.  Psychiatric:        Mood and Affect: Mood normal.        Behavior: Behavior normal.        Thought Content: Thought content normal.        Judgment: Judgment normal.      LABORATORY DATA:  I have reviewed the labs as listed.  CBC    Component Value Date/Time   WBC 4.5 02/25/2019 1206   RBC 4.41 02/25/2019 1206   HGB 12.8 02/25/2019 1206   HCT 40.2 02/25/2019 1206   PLT 186 02/25/2019 1206   MCV 91.2 02/25/2019 1206   MCH 29.0 02/25/2019 1206   MCHC 31.8 02/25/2019 1206   RDW 14.8 02/25/2019 1206   LYMPHSABS 1.5 02/25/2019 1206   MONOABS 0.7 02/25/2019 1206   EOSABS 0.1 02/25/2019 1206   BASOSABS 0.1 02/25/2019 1206   CMP Latest Ref Rng & Units 09/08/2019 02/25/2019 02/10/2019  Glucose 70 - 99 mg/dL 86 101(H) 94  BUN 8 - 23 mg/dL '13 14 14  ' Creatinine 0.44 - 1.00 mg/dL 0.78 0.92 0.79  Sodium 135 - 145 mmol/L 140 141 140  Potassium 3.5 - 5.1 mmol/L 3.6 3.9 4.1  Chloride 98 - 111 mmol/L 105 109 107  CO2 22 - 32 mmol/L '26 25 27  ' Calcium 8.9 - 10.3 mg/dL 9.2 9.1 9.6  Total Protein 6.5 - 8.1 g/dL 6.9 7.2 6.5  Total Bilirubin 0.3 - 1.2 mg/dL 0.8 0.8 0.6  Alkaline Phos 38 - 126 U/L 38 37(L) -  AST 15 - 41 U/L '22 29 19  ' ALT 0 - 44 U/L '16 23 15    ' DIAGNOSTIC IMAGING:  I have independently reviewed the mammogram scan and discussed with the patient.   I personally  performed a face-to-face visit.  All questions were answered to patient's stated satisfaction. Encouraged patient to call with any new concerns or questions before his next visit to the cancer center and we can certain see him sooner, if needed.     ASSESSMENT & PLAN:   Infiltrating ductal carcinoma of breast 1.  Stage IIIa ([id number]) left breast IDC: -Status post left MRM on 02/14/2011, showing multifocal tumor, largest measuring 1.5 cm, grade 3, deep surgical margins positive, ER/PR positive, HER-2 negative, Ki 67 45%,  6 out of 7 lymph nodes positive.  Status post 6 cycles of epirubicin and Cytoxan, followed by radiation therapy. -Anastrozole started on 11/19/2011.  She will complete for 10 years. -Today's physical examination not show any suspicious masses at the left mastectomy site.  Right breast has no palpable masses.  No adenopathy. -Last mammogram on 08/10/2019 was BI-RADS Category 1 negative. -She will follow up in 6 months with repeat labs  2.  Osteopenia: -Initial DEXA scan on 07/12/2016 showed T score of -1.4. -She was started on Prolia on 09/01/2014 which she is tolerating well. -DEXA scan dated 07/21/2018 showed T score of -1.4. -She was told to take calcium and vitamin D twice daily. -She will receive her Prolia injection today on 09/08/2019.  3.  Thromboembolism: -She had a right leg DVT in 2012. -Bilateral pulmonary embolism was diagnosed in 2014. -She is currently on Xarelto indefinitely.      Orders placed this encounter:  Orders Placed This Encounter  Procedures  . CBC with Differential  . Lactate dehydrogenase  . CBC with Differential/Platelet  . Comprehensive metabolic panel  . Ferritin  . Iron and TIBC  . Vitamin B12  . VITAMIN D 25 Hydroxy (Vit-D Deficiency, Fractures)  . Folate      Francene Finders, FNP-C [place] [phone]

## 2019-10-06 ENCOUNTER — Ambulatory Visit: Payer: Medicare HMO | Attending: Internal Medicine

## 2019-10-06 DIAGNOSIS — Z23 Encounter for immunization: Secondary | ICD-10-CM | POA: Insufficient documentation

## 2019-10-12 ENCOUNTER — Other Ambulatory Visit: Payer: Self-pay | Admitting: Family Medicine

## 2019-10-16 ENCOUNTER — Other Ambulatory Visit: Payer: Self-pay | Admitting: Family Medicine

## 2019-10-21 ENCOUNTER — Other Ambulatory Visit: Payer: Self-pay | Admitting: Family Medicine

## 2019-11-03 ENCOUNTER — Ambulatory Visit: Payer: Medicare HMO | Attending: Internal Medicine

## 2019-11-03 DIAGNOSIS — Z23 Encounter for immunization: Secondary | ICD-10-CM

## 2019-11-03 NOTE — Progress Notes (Signed)
   Covid-19 Vaccination Clinic  Name:  AINSLEY RUNSER    MRN: [id number] DOB: 11-05-1929  11/03/2019  Ms. Tonn was observed post Covid-19 immunization for 30 minutes based on pre-vaccination screening without incident. She was provided with Vaccine Information Sheet and instruction to access the V-Safe system.   Ms. Bagdonas was instructed to call 911 with any severe reactions post vaccine: [name] Difficulty breathing  . Swelling of face and throat  . A fast heartbeat  . A bad rash all over body  . Dizziness and weakness   Immunizations Administered    Name Date Dose VIS Date Route   Moderna COVID-19 Vaccine 11/03/2019 10:28 AM 0.5 mL 07/07/2019 Intramuscular   Manufacturer: Moderna   Lot: [id number]   [id number]

## 2019-11-29 ENCOUNTER — Other Ambulatory Visit: Payer: Self-pay | Admitting: Family Medicine

## 2019-12-11 LAB — HM DIABETES EYE EXAM

## 2019-12-18 ENCOUNTER — Other Ambulatory Visit: Payer: Self-pay | Admitting: Family Medicine

## 2019-12-21 DIAGNOSIS — H04123 Dry eye syndrome of bilateral lacrimal glands: Secondary | ICD-10-CM | POA: Diagnosis not present

## 2019-12-21 DIAGNOSIS — H25013 Cortical age-related cataract, bilateral: Secondary | ICD-10-CM | POA: Diagnosis not present

## 2019-12-21 DIAGNOSIS — H401232 Low-tension glaucoma, bilateral, moderate stage: Secondary | ICD-10-CM | POA: Diagnosis not present

## 2019-12-21 DIAGNOSIS — H2513 Age-related nuclear cataract, bilateral: Secondary | ICD-10-CM | POA: Diagnosis not present

## 2019-12-22 ENCOUNTER — Encounter: Payer: Self-pay | Admitting: *Deleted

## 2020-01-01 ENCOUNTER — Other Ambulatory Visit: Payer: Self-pay | Admitting: *Deleted

## 2020-01-01 MED ORDER — LEVOTHYROXINE SODIUM 100 MCG PO TABS: 100.0000 ug | ORAL_TABLET | Freq: Every morning | ORAL | 0 refills | Status: DC

## 2020-01-08 ENCOUNTER — Other Ambulatory Visit: Payer: Self-pay | Admitting: Family Medicine

## 2020-01-11 ENCOUNTER — Other Ambulatory Visit: Payer: Self-pay | Admitting: *Deleted

## 2020-01-11 MED ORDER — LEVOTHYROXINE SODIUM 100 MCG PO TABS: 100.0000 ug | ORAL_TABLET | Freq: Every morning | ORAL | 3 refills | Status: DC

## 2020-02-11 ENCOUNTER — Encounter: Payer: Medicare HMO | Admitting: Family Medicine

## 2020-02-15 ENCOUNTER — Encounter: Payer: Self-pay | Admitting: Family Medicine

## 2020-02-15 ENCOUNTER — Ambulatory Visit (INDEPENDENT_AMBULATORY_CARE_PROVIDER_SITE_OTHER): Payer: Medicare HMO | Admitting: Family Medicine

## 2020-02-15 ENCOUNTER — Other Ambulatory Visit: Payer: Self-pay

## 2020-02-15 VITALS — BP 137/80 | HR 75 | Temp 98.4°F | Resp 14 | Ht 64.0 in | Wt 188.1 lb

## 2020-02-15 DIAGNOSIS — E039 Hypothyroidism, unspecified: Secondary | ICD-10-CM

## 2020-02-15 DIAGNOSIS — G8929 Other chronic pain: Secondary | ICD-10-CM

## 2020-02-15 DIAGNOSIS — Z0000 Encounter for general adult medical examination without abnormal findings: Secondary | ICD-10-CM

## 2020-02-15 DIAGNOSIS — J309 Allergic rhinitis, unspecified: Secondary | ICD-10-CM

## 2020-02-15 DIAGNOSIS — I10 Essential (primary) hypertension: Secondary | ICD-10-CM

## 2020-02-15 DIAGNOSIS — M25562 Pain in left knee: Secondary | ICD-10-CM

## 2020-02-15 DIAGNOSIS — E7849 Other hyperlipidemia: Secondary | ICD-10-CM

## 2020-02-15 MED ORDER — AZELASTINE HCL 0.1 % NA SOLN: 2.0000 | Freq: Two times a day (BID) | NASAL | 1 refills | Status: DC

## 2020-02-15 NOTE — Assessment & Plan Note (Signed)
Progressive stiffness and instability, refer Ortho

## 2020-02-15 NOTE — Patient Instructions (Addendum)
F/U in in office with MD in 6 months, call if ou need me sooner  Fasting lipid, cmp and EGFr  And TSH first week in September  Call in August for flu vaccine  You are referred to Orthopedics re left knee  Vision is poor need to consider surgery  Think about what you will eat, plan ahead. Choose " clean, green, fresh or frozen" over canned, processed or packaged foods which are more sugary, salty and fatty. 70 to 75% of food eaten should be vegetables and fruit. Three meals at set times with snacks allowed between meals, but they must be fruit or vegetables. Aim to eat over a 12 hour period , example 7 am to 7 pm, and STOP after  your last meal of the day. Drink water,generally about 64 ounces per day, no other drink is as healthy. Fruit juice is best enjoyed in a healthy way, by EATING the fruit.'

## 2020-02-15 NOTE — Assessment & Plan Note (Signed)
Needs as needed, nose spray

## 2020-02-15 NOTE — Assessment & Plan Note (Signed)

## 2020-02-15 NOTE — Progress Notes (Signed)
° ° °  Lisa Klein     MRN: [id number]      DOB: 1930/05/26  HPI: Patient is in for annual physical exam. C/o increased left knee pain, stiffness, selling and instability, needs Ortho eval Recent labs, if available are reviewed. Immunization is reviewed , and  Is up to date   PE: BP 137/80 (BP Location: Right Arm, Patient Position: Sitting, Cuff Size: Normal)    Pulse 75    Temp 98.4 F (36.9 C) (Oral)    Resp 14    Ht 5\' 4"  (1.626 m)    Wt 188 lb 1.9 oz (85.3 kg)    SpO2 94%    BMI 32.29 kg/m   Pleasant  female, alert and oriented x 3, in no cardio-pulmonary distress. Afebrile. HEENT No facial trauma or asymetry. Sinuses non tender.  Extra occullar muscles intact.. External ears normal, . Neck: decreased ROM, no adenopathy,JVD or thyromegaly.No bruits.  Chest: Clear to ascultation bilaterally.No crackles or wheezes. Non tender to palpation  Breast: Right breast :no masses or lumps. No tenderness. No nipple discharge or inversion. No axillary or supraclavicular adenopathy Left mastectomy, no palpable lump, no axillary or supraclavicular adenopathy  Cardiovascular system; Heart sounds normal,  S1 and  S2 ,no S3.  No murmur, or thrill. Apical beat not displaced Peripheral pulses normal.  Abdomen: Soft, non tender, no organomegaly or masses. No bruits. Bowel sounds normal. No guarding, tenderness or rebound.      Musculoskeletal exam: Decreased  ROM of spine, hips , shoulders and markedly reduced in left  knee.  deformity ,swelling and crepitus noted.of left knee No muscle wasting or atrophy.   Neurologic: Cranial nerves 2 to 12 intact. Power, tone ,sensation and reflexes normal throughout.  disturbance in gait. No tremor.  Skin: Intact, no ulceration, erythema , scaling or rash noted. Pigmentation normal throughout  Psych; Normal mood and affect. Judgement and concentration normal   Assessment & Plan:  Annual physical exam Annual exam as  documented. Counseling done  re healthy lifestyle involving commitment to 150 minutes exercise per week, heart healthy diet, and attaining healthy weight.The importance of adequate sleep also discussed. Regular seat belt use and home safety, is also discussed. Changes in health habits are decided on by the patient with goals and time frames  set for achieving them. Immunization and cancer screening needs are specifically addressed at this visit.   Knee pain, left Progressive stiffness and instability, refer Ortho  Allergic rhinitis Needs as needed, nose spray

## 2020-02-19 ENCOUNTER — Encounter: Payer: Self-pay | Admitting: Family Medicine

## 2020-03-01 ENCOUNTER — Encounter: Payer: Self-pay | Admitting: Orthopaedic Surgery

## 2020-03-01 ENCOUNTER — Ambulatory Visit: Payer: Medicare HMO

## 2020-03-01 ENCOUNTER — Ambulatory Visit: Payer: Medicare HMO | Admitting: Orthopaedic Surgery

## 2020-03-01 ENCOUNTER — Other Ambulatory Visit: Payer: Self-pay

## 2020-03-01 VITALS — BP 158/81 | HR 88 | Ht 64.0 in | Wt 188.0 lb

## 2020-03-01 DIAGNOSIS — M25562 Pain in left knee: Secondary | ICD-10-CM | POA: Diagnosis not present

## 2020-03-01 DIAGNOSIS — G8929 Other chronic pain: Secondary | ICD-10-CM | POA: Diagnosis not present

## 2020-03-01 NOTE — Progress Notes (Signed)
Subjective:    Patient ID: Lisa Klein, female    DOB: [birth year], 86 y.o.   MRN: [id number]  HPI She has pain of the left knee getting worse and worse.  She has the left knee rubbing against the other knee when walking.  She has marked knock-knee on the left when standing. She has no trauma, no redness. She has swelling and popping.  She has giving way.  She has had problems with the left knee for many years.  She has been in contact with Dr. Moshe Cipro who told her to come here.    She is on Xarelto and cannot take NSAIDs.   Review of Systems  Constitutional: Positive for activity change.  Musculoskeletal: Positive for arthralgias, gait problem and joint swelling.  All other systems reviewed and are negative.  For Review of Systems, all other systems reviewed and are negative.  The following is a summary of the past history medically, past history surgically, known current medicines, social history and family history.  This information is gathered electronically by the computer from prior information and documentation.  I review this each visit and have found including this information at this point in the chart is beneficial and informative.   Past Medical History:  Diagnosis Date  . Cancer ([place]) 2012   LEFT BREAST  . Clotting disorder ([place]) 05/2011   dvt, right leg  . Degenerative disc disease    with nerve compression   . DJD (degenerative joint disease)   . Hypertension   . Hypertension   . Hypothyroidism   . Hypothyroidism   . Long term current use of anticoagulant therapy 12/07/2014   Pulmonary embolus   . Obesity   . Osteopenia 08/24/2014    Past Surgical History:  Procedure Laterality Date  . ABDOMINAL HYSTERECTOMY  1980   fibroids  . BREAST SURGERY  2012   left total mastectomy  . CHOLECYSTECTOMY  80'S   [facility]  . CHOLECYSTECTOMY    . HIP PINNING,CANNULATED Left 01/19/2013   Procedure: CANNULATED HIP PINNING-  left;  Surgeon: Marin Shutter, MD;  Location: [facility];  Service: Orthopedics;  Laterality: Left;  [name] MASTECTOMY MODIFIED RADICAL  02/14/11   left  . PORT-A-CATH REMOVAL N/A 12/05/2012   Procedure: MINOR REMOVAL PORT-A-CATH;  Surgeon: Jamesetta So, MD;  Location[facility];  Service: General;  Laterality: N/A;  In Minor Room  . PORTACATH PLACEMENT  04/16/2011   Procedure: INSERTION PORT-A-CATH;  Surgeon: Jamesetta So;  Location: [facility];  Service: General;  Laterality: Right;  right subclavian  . VESICOVAGINAL FISTULA CLOSURE W/ TAH     [facility]    Current Outpatient Medications on File Prior to Visit  Medication Sig Dispense Refill  . acetaminophen (TYLENOL) 650 MG CR tablet Take 650 mg by mouth 2 (two) times daily.     [name] anastrozole (ARIMIDEX) 1 MG tablet Take 1 tablet (1 mg total) by mouth at bedtime. 90 tablet 3  . aspirin EC 81 MG tablet Take 81 mg by mouth daily.    [name] azelastine (ASTELIN) 0.1 % nasal spray Place 2 sprays into both nostrils 2 (two) times daily. Use in each nostril as directed 30 mL 1  . Biotin w/ Vitamins C & E (HAIR/SKIN/NAILS PO) Take by mouth.    . calcium-vitamin D (OSCAL WITH D) 500-200 MG-UNIT tablet Take 1 tablet by mouth 2 (two) times daily.    [name] Cod Liver Oil 1000 MG CAPS Take 1 capsule by  mouth daily.     [name] denosumab (PROLIA) 60 MG/ML SOLN injection Inject 60 mg into the skin every 6 (six) months. Administer in upper arm, thigh, or abdomen    . docusate sodium (COLACE) 100 MG capsule Take 100 mg by mouth daily as needed for constipation. For constipation    . hydrochlorothiazide (MICROZIDE) 12.5 MG capsule TAKE 1 CAPSULE EVERY MORNING 90 capsule 2  . levothyroxine (SYNTHROID) 100 MCG tablet Take 1 tablet (100 mcg total) by mouth every morning. 90 tablet 3  . lovastatin (MEVACOR) 20 MG tablet TAKE 1 TABLET (20 MG TOTAL) BY MOUTH AT BEDTIME. 90 tablet 3  . potassium chloride (KLOR-CON) 10 MEQ tablet TAKE 1 TABLET (10 MEQ TOTAL) BY MOUTH 2 (TWO) TIMES DAILY. 180 tablet 1  . pyridOXINE (VITAMIN B-6) 100 MG tablet Take 100  mg by mouth daily.    . vitamin E 1000 UNIT capsule Take 1,000 Units by mouth daily.    Alveda Reasons 20 MG TABS tablet TAKE 1 TABLET (20 MG TOTAL) BY MOUTH DAILY WITH SUPPER. 90 tablet 3   Current Facility-Administered Medications on File Prior to Visit  Medication Dose Route Frequency Provider Last Rate Last Admin  . heparin lock flush 100 unit/mL  500 Units Intravenous Once Kefalas, Thomas S, PA-C      . heparin lock flush 100 unit/mL  500 Units Intravenous Once Everardo All, MD      . sodium chloride 0.9 % injection 10 mL  10 mL Intravenous PRN Kefalas, Thomas S, PA-C      . sodium chloride 0.9 % injection 10 mL  10 mL Intravenous PRN Everardo All, MD        Social History   Socioeconomic History  . Marital status: Widowed    Spouse name: Not on file  . Number of children: 2  . Years of education: 93  . Highest education level: 12th grade  Occupational History  . Occupation: [employer] part time   . Occupation: retired/disabled   Tobacco Use  . Smoking status: Never Smoker  . Smokeless tobacco: Never Used  Substance and Sexual Activity  . Alcohol use: No  . Drug use: No  . Sexual activity: Not Currently    Birth control/protection: Post-menopausal  Other Topics Concern  . Not on file  Social History Narrative   Reports starting to have a lot of trouble doing things for herself. Lives alone, tries to drive to places, but it is getting increasingly harder to do.    Social Determinants of Health   Financial Resource Strain:   . Difficulty of Paying Living Expenses:   Food Insecurity:   . Worried About Charity fundraiser in the Last Year:   . Arboriculturist in the Last Year:   Transportation Needs:   . Film/video editor (Medical):   [name] Lack of Transportation (Non-Medical):   Physical Activity:   . Days of Exercise per Week:   . Minutes of Exercise per Session:   Stress:   . Feeling of Stress :   Social Connections:   . Frequency of Communication with Friends and  Family:   . Frequency of Social Gatherings with Friends and Family:   . Attends Religious Services:   . Active Member of Clubs or Organizations:   . Attends Archivist Meetings:   [name] Marital Status:   Intimate Partner Violence:   . Fear of Current or Ex-Partner:   . Emotionally Abused:   [name] Physically Abused:   .  Sexually Abused:     Family History  Problem Relation Age of Onset  . COPD Father   . Lung disease Father   . Hypertension Sister   . Arthritis Mother   . Hypertension Sister   . Heart disease Son        stent  . Anesthesia problems Neg Hx   . Malignant hyperthermia Neg Hx   . Hypotension Neg Hx   . Pseudochol deficiency Neg Hx     BP (!) 158/81   Pulse 88   Ht 5\' 4"  (1.626 m)   Wt 188 lb (85.3 kg)   BMI 32.27 kg/m   Body mass index is 32.27 kg/m.      Objective:   Physical Exam Vitals and nursing note reviewed.  Constitutional:      Appearance: She is well-developed.  HENT:     Head: Normocephalic and atraumatic.  Eyes:     Conjunctiva/sclera: Conjunctivae normal.     Pupils: Pupils are equal, round, and reactive to light.  Cardiovascular:     Rate and Rhythm: Normal rate and regular rhythm.  Pulmonary:     Effort: Pulmonary effort is normal.  Abdominal:     Palpations: Abdomen is soft.  Musculoskeletal:     Cervical back: Normal range of motion and neck supple.       Legs:  Skin:    General: Skin is warm and dry.  Neurological:     Mental Status: She is alert and oriented to person, place, and time.     Cranial Nerves: No cranial nerve deficit.     Motor: No abnormal muscle tone.     Coordination: Coordination normal.     Deep Tendon Reflexes: Reflexes are normal and symmetric. Reflexes normal.  Psychiatric:        Behavior: Behavior normal.        Thought Content: Thought content normal.        Judgment: Judgment normal.    X-rays were done of the left knee, reported separately.       Assessment & Plan:   Encounter  Diagnosis  Name Primary?  . Chronic pain of left knee Yes   She has marked DJD changes of the left knee.  She could be a candidate for a total knee if she had medical clearance but she does not want to do this.  I will give brace.  I will inject.  I can inject knee every couple of months if it helps.  PROCEDURE NOTE:  The patient requests injections of the left knee , verbal consent was obtained.  The left knee was prepped appropriately after time out was performed.   Sterile technique was observed and injection of 1 cc of Depo-Medrol 40 mg with several cc's of plain xylocaine. Anesthesia was provided by ethyl chloride and a 20-gauge needle was used to inject the knee area. The injection was tolerated well.  A band aid dressing was applied.  The patient was advised to apply ice later today and tomorrow to the injection sight as needed.  Return in one month.  Call if any problem.  Precautions discussed.    Electronically Signed Sanjuana Kava, MD 7/27/202110:46 AM

## 2020-03-07 ENCOUNTER — Other Ambulatory Visit: Payer: Self-pay | Admitting: Family Medicine

## 2020-03-08 ENCOUNTER — Inpatient Hospital Stay (HOSPITAL_BASED_OUTPATIENT_CLINIC_OR_DEPARTMENT_OTHER): Payer: Medicare HMO | Admitting: Nurse Practitioner

## 2020-03-08 ENCOUNTER — Other Ambulatory Visit: Payer: Self-pay

## 2020-03-08 ENCOUNTER — Inpatient Hospital Stay (HOSPITAL_COMMUNITY): Payer: Medicare HMO | Attending: Hematology

## 2020-03-08 ENCOUNTER — Inpatient Hospital Stay (HOSPITAL_COMMUNITY): Payer: Medicare HMO

## 2020-03-08 VITALS — BP 154/67 | HR 70 | Temp 98.3°F | Resp 18 | Wt 171.5 lb

## 2020-03-08 DIAGNOSIS — Z79899 Other long term (current) drug therapy: Secondary | ICD-10-CM | POA: Insufficient documentation

## 2020-03-08 DIAGNOSIS — Z170 Estrogen receptor positive status [ER+]: Secondary | ICD-10-CM | POA: Insufficient documentation

## 2020-03-08 DIAGNOSIS — C50912 Malignant neoplasm of unspecified site of left female breast: Secondary | ICD-10-CM | POA: Diagnosis not present

## 2020-03-08 DIAGNOSIS — Z8249 Family history of ischemic heart disease and other diseases of the circulatory system: Secondary | ICD-10-CM | POA: Diagnosis not present

## 2020-03-08 DIAGNOSIS — Z86711 Personal history of pulmonary embolism: Secondary | ICD-10-CM | POA: Diagnosis not present

## 2020-03-08 DIAGNOSIS — Z7982 Long term (current) use of aspirin: Secondary | ICD-10-CM | POA: Insufficient documentation

## 2020-03-08 DIAGNOSIS — Z8261 Family history of arthritis: Secondary | ICD-10-CM | POA: Insufficient documentation

## 2020-03-08 DIAGNOSIS — Z1231 Encounter for screening mammogram for malignant neoplasm of breast: Secondary | ICD-10-CM | POA: Diagnosis not present

## 2020-03-08 DIAGNOSIS — Z79811 Long term (current) use of aromatase inhibitors: Secondary | ICD-10-CM | POA: Insufficient documentation

## 2020-03-08 DIAGNOSIS — Z7901 Long term (current) use of anticoagulants: Secondary | ICD-10-CM | POA: Insufficient documentation

## 2020-03-08 DIAGNOSIS — Z86718 Personal history of other venous thrombosis and embolism: Secondary | ICD-10-CM | POA: Diagnosis not present

## 2020-03-08 DIAGNOSIS — M1990 Unspecified osteoarthritis, unspecified site: Secondary | ICD-10-CM | POA: Insufficient documentation

## 2020-03-08 DIAGNOSIS — M8580 Other specified disorders of bone density and structure, unspecified site: Secondary | ICD-10-CM | POA: Insufficient documentation

## 2020-03-08 LAB — CBC WITH DIFFERENTIAL/PLATELET
Abs Immature Granulocytes: 0.01 10*3/uL (ref 0.00–0.07)
Basophils Absolute: 0.1 10*3/uL (ref 0.0–0.1)
Basophils Relative: 2 %
Eosinophils Absolute: 0.1 10*3/uL (ref 0.0–0.5)
Eosinophils Relative: 3 %
HCT: 40.3 % (ref 36.0–46.0)
Hemoglobin: 12.9 g/dL (ref 12.0–15.0)
Immature Granulocytes: 0 %
Lymphocytes Relative: 34 %
Lymphs Abs: 1.4 10*3/uL (ref 0.7–4.0)
MCH: 29.5 pg (ref 26.0–34.0)
MCHC: 32 g/dL (ref 30.0–36.0)
MCV: 92.2 fL (ref 80.0–100.0)
Monocytes Absolute: 0.6 10*3/uL (ref 0.1–1.0)
Monocytes Relative: 15 %
Neutro Abs: 1.9 10*3/uL (ref 1.7–7.7)
Neutrophils Relative %: 46 %
Platelets: 164 10*3/uL (ref 150–400)
RBC: 4.37 MIL/uL (ref 3.87–5.11)
RDW: 15.4 % (ref 11.5–15.5)
WBC: 4 10*3/uL (ref 4.0–10.5)
nRBC: 0 % (ref 0.0–0.2)

## 2020-03-08 LAB — LACTATE DEHYDROGENASE: LDH: 157 U/L (ref 98–192)

## 2020-03-08 LAB — FOLATE: Folate: 17.8 ng/mL (ref >5.9)

## 2020-03-08 LAB — COMPREHENSIVE METABOLIC PANEL
ALT: 19 U/L (ref 0–44)
AST: 22 U/L (ref 15–41)
Albumin: 3.7 g/dL (ref 3.5–5.0)
Alkaline Phosphatase: 35 U/L — ABNORMAL LOW (ref 38–126)
Anion gap: 8 (ref 5–15)
BUN: 11 mg/dL (ref 8–23)
CO2: 26 mmol/L (ref 22–32)
Calcium: 9.2 mg/dL (ref 8.9–10.3)
Chloride: 105 mmol/L (ref 98–111)
Creatinine, Ser: 0.95 mg/dL (ref 0.44–1.00)
GFR calc Af Amer: >60 mL/min (ref >60)
GFR calc non Af Amer: 54 mL/min — ABNORMAL LOW (ref >60)
Glucose, Bld: 99 mg/dL (ref 70–99)
Potassium: 4 mmol/L (ref 3.5–5.1)
Sodium: 139 mmol/L (ref 135–145)
Total Bilirubin: 0.8 mg/dL (ref 0.3–1.2)
Total Protein: 6.9 g/dL (ref 6.5–8.1)

## 2020-03-08 LAB — IRON AND TIBC
Iron: 113 ug/dL (ref 28–170)
Saturation Ratios: 30 % (ref 10.4–31.8)
TIBC: 375 ug/dL (ref 250–450)
UIBC: 262 ug/dL

## 2020-03-08 LAB — VITAMIN B12: Vitamin B-12: 793 pg/mL (ref 180–914)

## 2020-03-08 LAB — VITAMIN D 25 HYDROXY (VIT D DEFICIENCY, FRACTURES): Vit D, 25-Hydroxy: 44.63 ng/mL (ref 30–100)

## 2020-03-08 LAB — FERRITIN: Ferritin: 16 ng/mL (ref 11–307)

## 2020-03-08 NOTE — Assessment & Plan Note (Signed)
1.  Stage IIIa ([id number]) left breast IDC: -Status post left MRM on 02/14/2011, showing multifocal tumor, largest measuring 1.5 cm, grade 3, deep surgical margins positive, ER/PR positive, HER-2 negative, Ki 67 45%, 6 out of 7 lymph nodes positive.  Status post 6 cycles of epirubicin and Cytoxan, followed by radiation therapy. -Anastrozole started on 11/19/2011.  She will complete for 10 years. -Today's physical examination not show any suspicious masses at the left mastectomy site.  Right breast has no palpable masses.  No adenopathy. -Last mammogram on 08/10/2019 was BI-RADS Category 1 negative. -Labs done on 03/08/2020 were all WNL -She will follow up in 6 months with repeat labs and mammogram  2.  Osteopenia: -Initial DEXA scan on 07/12/2016 showed T score of -1.4. -She was started on Prolia on 09/01/2014 which she is tolerating well. -DEXA scan dated 07/21/2018 showed T score of -1.4. -She was told to take calcium and vitamin D twice daily. -We will set up another DEXA scan with her next visit.  3.  Thromboembolism: -She had a right leg DVT in 2012. -Bilateral pulmonary embolism was diagnosed in 2014. -She is currently on Xarelto indefinitely.

## 2020-03-08 NOTE — Progress Notes (Signed)
Pt left before receiving Prolia injection today.  Francene Finders, NP, aware of the situation and asked for Amy to call the pt to reschedule. Amy left a voicemail to see if the pt could come back on Thursday just for her Prolia injection.

## 2020-03-08 NOTE — Progress Notes (Signed)
Lisa Klein, Lisa Klein [id number]   CLINIC:  Medical Oncology/Hematology  PCP:  Lisa Helper, MD [address] [place] [address] [phone]   REASON FOR VISIT: Follow-up for breast cancer   CURRENT THERAPY: Anastrozole  CANCER STAGING: Cancer Staging Infiltrating ductal carcinoma of breast ([place]) Staging form: Breast, AJCC 7th Edition - Clinical: Stage IIIA (T1c, N2a, cM0) - Signed by Baird Cancer, PA on 04/18/2011    INTERVAL HISTORY:  Lisa Klein 86 y.o. female returns for routine follow-up for breast cancer.  Patient reports she is doing well since her last visit.  She denies any new lumps or bumps present.  She denies any new bone pain. Denies any nausea, vomiting, or diarrhea. Denies any new pains. Had not noticed any recent bleeding such as epistaxis, hematuria or hematochezia. Denies recent chest pain on exertion, shortness of breath on minimal exertion, pre-syncopal episodes, or palpitations. Denies any numbness or tingling in hands or feet. Denies any recent fevers, infections, or recent hospitalizations. Patient reports appetite at 100% and energy level at 25%.  She is eating well maintain her weight is time.     REVIEW OF SYSTEMS:  Review of Systems  Gastrointestinal: Positive for diarrhea and nausea.  Neurological: Positive for dizziness.  Psychiatric/Behavioral: Positive for sleep disturbance.  All other systems reviewed and are negative.    PAST MEDICAL/SURGICAL HISTORY:  Past Medical History:  Diagnosis Date  . Cancer ([place]) 2012   LEFT BREAST  . Clotting disorder ([place]) 05/2011   dvt, right leg  . Degenerative disc disease    with nerve compression   . DJD (degenerative joint disease)   . Hypertension   . Hypertension   . Hypothyroidism   . Hypothyroidism   . Long term current use of anticoagulant therapy 12/07/2014   Pulmonary embolus   . Obesity   . Osteopenia 08/24/2014   Past  Surgical History:  Procedure Laterality Date  . ABDOMINAL HYSTERECTOMY  1980   fibroids  . BREAST SURGERY  2012   left total mastectomy  . CHOLECYSTECTOMY  80'S   [facility]  . CHOLECYSTECTOMY    . HIP PINNING,CANNULATED Left 01/19/2013   Procedure: CANNULATED HIP PINNING-  left;  Surgeon: Marin Shutter, MD;  Location: [facility];  Service: Orthopedics;  Laterality: Left;  Lisa Klein MASTECTOMY MODIFIED RADICAL  02/14/11   left  . PORT-A-CATH REMOVAL N/A 12/05/2012   Procedure: MINOR REMOVAL PORT-A-CATH;  Surgeon: Jamesetta So, MD;  Location: [facility];  Service: General;  Laterality: N/A;  In Minor Room  . PORTACATH PLACEMENT  04/16/2011   Procedure: INSERTION PORT-A-CATH;  Surgeon: Jamesetta So;  Location: [facility];  Service: General;  Laterality: Right;  right subclavian  . VESICOVAGINAL FISTULA CLOSURE W/ TAH     [facility]     SOCIAL HISTORY:  Social History   Socioeconomic History  . Marital status: Widowed    Spouse name: Not on file  . Number of children: 2  . Years of education: 20  . Highest education level: 12th grade  Occupational History  . Occupation: [employer] part time   . Occupation: retired/disabled   Tobacco Use  . Smoking status: Never Smoker  . Smokeless tobacco: Never Used  Substance and Sexual Activity  . Alcohol use: No  . Drug use: No  . Sexual activity: Not Currently    Birth control/protection: Post-menopausal  Other Topics Concern  . Not on file  Social History  Narrative   Reports starting to have a lot of trouble doing things for herself. Lives alone, tries to drive to places, but it is getting increasingly harder to do.    Social Determinants of Health   Financial Resource Strain:   . Difficulty of Paying Living Expenses:   Food Insecurity:   . Worried About Charity fundraiser in the Last Year:   . Arboriculturist in the Last Year:   Transportation Needs:   . Film/video editor (Medical):   Lisa Klein Lack of Transportation (Non-Medical):   Physical Activity:   . Days of  Exercise per Week:   . Minutes of Exercise per Session:   Stress:   . Feeling of Stress :   Social Connections:   . Frequency of Communication with Friends and Family:   . Frequency of Social Gatherings with Friends and Family:   . Attends Religious Services:   . Active Member of Clubs or Organizations:   . Attends Archivist Meetings:   Lisa Klein Marital Status:   Intimate Partner Violence:   . Fear of Current or Ex-Partner:   . Emotionally Abused:   Lisa Klein Physically Abused:   . Sexually Abused:     FAMILY HISTORY:  Family History  Problem Relation Age of Onset  . COPD Father   . Lung disease Father   . Hypertension Sister   . Arthritis Mother   . Hypertension Sister   . Heart disease Son        stent  . Anesthesia problems Neg Hx   . Malignant hyperthermia Neg Hx   . Hypotension Neg Hx   . Pseudochol deficiency Neg Hx     CURRENT MEDICATIONS:  Outpatient Encounter Medications as of 03/08/2020  Medication Sig  . acetaminophen (TYLENOL) 650 MG CR tablet Take 650 mg by mouth 2 (two) times daily.   Lisa Klein anastrozole (ARIMIDEX) 1 MG tablet Take 1 tablet (1 mg total) by mouth at bedtime.  Lisa Klein aspirin EC 81 MG tablet Take 81 mg by mouth daily.  Lisa Klein azelastine (ASTELIN) 0.1 % nasal spray Place 2 sprays into both nostrils 2 (two) times daily. Use in each nostril as directed  . Biotin w/ Vitamins C & E (HAIR/SKIN/NAILS PO) Take by mouth.  . calcium-vitamin D (OSCAL WITH D) 500-200 MG-UNIT tablet Take 1 tablet by mouth 2 (two) times daily.  Lisa Klein Cod Liver Oil 1000 MG CAPS Take 1 capsule by mouth daily.   Lisa Klein denosumab (PROLIA) 60 MG/ML SOLN injection Inject 60 mg into the skin every 6 (six) months. Administer in upper arm, thigh, or abdomen  . docusate sodium (COLACE) 100 MG capsule Take 100 mg by mouth daily as needed for constipation. For constipation  . hydrochlorothiazide (MICROZIDE) 12.5 MG capsule TAKE 1 CAPSULE EVERY MORNING  . levothyroxine (SYNTHROID) 100 MCG tablet Take 1 tablet (100  mcg total) by mouth every morning.  . lovastatin (MEVACOR) 20 MG tablet TAKE 1 TABLET (20 MG TOTAL) BY MOUTH AT BEDTIME.  Lisa Klein potassium chloride (KLOR-CON) 10 MEQ tablet TAKE 1 TABLET (10 MEQ TOTAL) BY MOUTH 2 (TWO) TIMES DAILY.  Lisa Klein potassium chloride (KLOR-CON) 10 MEQ tablet TAKE 1 TABLET TWICE DAILY  . pyridOXINE (VITAMIN B-6) 100 MG tablet Take 100 mg by mouth daily.  . vitamin E 1000 UNIT capsule Take 1,000 Units by mouth daily.  Alveda Reasons 20 MG TABS tablet TAKE 1 TABLET (20 MG TOTAL) BY MOUTH DAILY WITH SUPPER.   Facility-Administered Encounter Medications as of  03/08/2020  Medication  . heparin lock flush 100 unit/mL  . heparin lock flush 100 unit/mL  . sodium chloride 0.9 % injection 10 mL  . sodium chloride 0.9 % injection 10 mL    ALLERGIES:  Allergies  Allergen Reactions  . Listerine [Antiseptic Mouth Rinse]     Caused sore throat  . Singulair [Montelukast Sodium] Other (See Comments)    Abdominal spasm      PHYSICAL EXAM:  ECOG Performance status: 1  Vitals:   03/08/20 1320  BP: (!) 154/67  Pulse: 70  Resp: 18  Temp: 98.3 F (36.8 C)  SpO2: 100%   Filed Weights   03/08/20 1320  Weight: 171 lb 8.3 oz (77.8 kg)   Physical Exam Constitutional:      Appearance: Normal appearance. She is normal weight.  Cardiovascular:     Rate and Rhythm: Normal rate and regular rhythm.     Heart sounds: Normal heart sounds.  Pulmonary:     Effort: Pulmonary effort is normal.     Breath sounds: Normal breath sounds.  Abdominal:     General: Bowel sounds are normal.     Palpations: Abdomen is soft.  Musculoskeletal:        General: Normal range of motion.  Skin:    General: Skin is warm.  Neurological:     Mental Status: She is alert and oriented to person, place, and time. Mental status is at baseline.  Psychiatric:        Mood and Affect: Mood normal.        Behavior: Behavior normal.        Thought Content: Thought content normal.        Judgment: Judgment normal.       LABORATORY DATA:  I have reviewed the labs as listed.  CBC    Component Value Date/Time   WBC 4.0 03/08/2020 1159   RBC 4.37 03/08/2020 1159   HGB 12.9 03/08/2020 1159   HCT 40.3 03/08/2020 1159   PLT 164 03/08/2020 1159   MCV 92.2 03/08/2020 1159   MCH 29.5 03/08/2020 1159   MCHC 32.0 03/08/2020 1159   RDW 15.4 03/08/2020 1159   LYMPHSABS 1.4 03/08/2020 1159   MONOABS 0.6 03/08/2020 1159   EOSABS 0.1 03/08/2020 1159   BASOSABS 0.1 03/08/2020 1159   CMP Latest Ref Rng & Units 03/08/2020 09/08/2019 02/25/2019  Glucose 70 - 99 mg/dL 99 86 101(H)  BUN 8 - 23 mg/dL '11 13 14  ' Creatinine 0.44 - 1.00 mg/dL 0.95 0.78 0.92  Sodium 135 - 145 mmol/L 139 140 141  Potassium 3.5 - 5.1 mmol/L 4.0 3.6 3.9  Chloride 98 - 111 mmol/L 105 105 109  CO2 22 - 32 mmol/L '26 26 25  ' Calcium 8.9 - 10.3 mg/dL 9.2 9.2 9.1  Total Protein 6.5 - 8.1 g/dL 6.9 6.9 7.2  Total Bilirubin 0.3 - 1.2 mg/dL 0.8 0.8 0.8  Alkaline Phos 38 - 126 U/L 35(L) 38 37(L)  AST 15 - 41 U/L '22 22 29  ' ALT 0 - 44 U/L '19 16 23    ' All questions were answered to patient's stated satisfaction. Encouraged patient to call with any new concerns or questions before his next visit to the cancer center and we can certain see him sooner, if needed.     ASSESSMENT & PLAN:  Infiltrating ductal carcinoma of breast 1.  Stage IIIa ([id number]) left breast IDC: -Status post left MRM on 02/14/2011, showing multifocal tumor, largest measuring 1.5 cm, grade  3, deep surgical margins positive, ER/PR positive, HER-2 negative, Ki 67 45%, 6 out of 7 lymph nodes positive.  Status post 6 cycles of epirubicin and Cytoxan, followed by radiation therapy. -Anastrozole started on 11/19/2011.  She will complete for 10 years. -Today's physical examination not show any suspicious masses at the left mastectomy site.  Right breast has no palpable masses.  No adenopathy. -Last mammogram on 08/10/2019 was BI-RADS Category 1 negative. -Labs done on 03/08/2020 were all  WNL -She will follow up in 6 months with repeat labs and mammogram  2.  Osteopenia: -Initial DEXA scan on 07/12/2016 showed T score of -1.4. -She was started on Prolia on 09/01/2014 which she is tolerating well. -DEXA scan dated 07/21/2018 showed T score of -1.4. -She was told to take calcium and vitamin D twice daily. -We will set up another DEXA scan with her next visit.  3.  Thromboembolism: -She had a right leg DVT in 2012. -Bilateral pulmonary embolism was diagnosed in 2014. -She is currently on Xarelto indefinitely.     Orders placed this encounter:  Orders Placed This Encounter  Procedures  . MM 3D SCREEN BREAST UNI RIGHT  . DG Bone Density  . Lactate dehydrogenase  . CBC with Differential/Platelet  . Comprehensive metabolic panel  . Vitamin B12  . VITAMIN D 25 Hydroxy (Vit-D Deficiency, Fractures)  . Folate     Francene Finders, FNP-C  [place] [phone]

## 2020-03-10 ENCOUNTER — Ambulatory Visit (HOSPITAL_COMMUNITY): Payer: Medicare HMO

## 2020-03-11 ENCOUNTER — Inpatient Hospital Stay (HOSPITAL_COMMUNITY): Payer: Medicare HMO

## 2020-03-11 ENCOUNTER — Encounter (HOSPITAL_COMMUNITY): Payer: Self-pay

## 2020-03-11 ENCOUNTER — Other Ambulatory Visit: Payer: Self-pay

## 2020-03-11 VITALS — BP 153/68 | HR 88 | Temp 96.9°F | Resp 17

## 2020-03-11 DIAGNOSIS — C50912 Malignant neoplasm of unspecified site of left female breast: Secondary | ICD-10-CM

## 2020-03-11 DIAGNOSIS — Z86711 Personal history of pulmonary embolism: Secondary | ICD-10-CM | POA: Diagnosis not present

## 2020-03-11 DIAGNOSIS — Z7901 Long term (current) use of anticoagulants: Secondary | ICD-10-CM | POA: Diagnosis not present

## 2020-03-11 DIAGNOSIS — Z170 Estrogen receptor positive status [ER+]: Secondary | ICD-10-CM | POA: Diagnosis not present

## 2020-03-11 DIAGNOSIS — Z7982 Long term (current) use of aspirin: Secondary | ICD-10-CM | POA: Diagnosis not present

## 2020-03-11 DIAGNOSIS — M1990 Unspecified osteoarthritis, unspecified site: Secondary | ICD-10-CM | POA: Diagnosis not present

## 2020-03-11 DIAGNOSIS — Z86718 Personal history of other venous thrombosis and embolism: Secondary | ICD-10-CM | POA: Diagnosis not present

## 2020-03-11 DIAGNOSIS — M8580 Other specified disorders of bone density and structure, unspecified site: Secondary | ICD-10-CM | POA: Diagnosis not present

## 2020-03-11 DIAGNOSIS — Z79811 Long term (current) use of aromatase inhibitors: Secondary | ICD-10-CM | POA: Diagnosis not present

## 2020-03-11 MED ORDER — DENOSUMAB 60 MG/ML ~~LOC~~ SOSY
PREFILLED_SYRINGE | SUBCUTANEOUS | Status: AC
  Filled 2020-03-11: qty 1

## 2020-03-11 MED ORDER — DENOSUMAB 60 MG/ML ~~LOC~~ SOSY
60.0000 mg | PREFILLED_SYRINGE | Freq: Once | SUBCUTANEOUS | Status: AC
  Administered 2020-03-11: 60 mg via SUBCUTANEOUS
  Filled 2020-03-11: qty 1

## 2020-03-16 ENCOUNTER — Other Ambulatory Visit: Payer: Self-pay | Admitting: Family Medicine

## 2020-03-17 ENCOUNTER — Other Ambulatory Visit (HOSPITAL_COMMUNITY): Payer: Self-pay | Admitting: Hematology

## 2020-03-17 DIAGNOSIS — C50912 Malignant neoplasm of unspecified site of left female breast: Secondary | ICD-10-CM

## 2020-04-07 ENCOUNTER — Ambulatory Visit (INDEPENDENT_AMBULATORY_CARE_PROVIDER_SITE_OTHER): Payer: Medicare HMO | Admitting: Orthopaedic Surgery

## 2020-04-07 ENCOUNTER — Other Ambulatory Visit: Payer: Self-pay

## 2020-04-07 ENCOUNTER — Encounter: Payer: Self-pay | Admitting: Orthopaedic Surgery

## 2020-04-07 VITALS — BP 165/107 | HR 68 | Ht 64.0 in | Wt 183.0 lb

## 2020-04-07 DIAGNOSIS — G8929 Other chronic pain: Secondary | ICD-10-CM | POA: Diagnosis not present

## 2020-04-07 DIAGNOSIS — M25562 Pain in left knee: Secondary | ICD-10-CM | POA: Diagnosis not present

## 2020-04-07 NOTE — Patient Instructions (Signed)
Call us when your knee hurts and you need an injection You will know when it starts to hurt Dr Luna Glasgow is in the office on Tuesdays and Thursdays

## 2020-04-07 NOTE — Progress Notes (Signed)
I am better  Her left knee is much improved after the injection last time and the brace.  She is using a cane and the brace but has better motion.  She says she can sleep now.  Encounter Diagnosis  Name Primary?  . Chronic pain of left knee Yes   I will see as needed.  Call if any problem.  Precautions discussed.   Electronically Signed Sanjuana Kava, MD 9/2/20219:56 AM

## 2020-05-14 ENCOUNTER — Other Ambulatory Visit: Payer: Self-pay | Admitting: Family Medicine

## 2020-06-13 ENCOUNTER — Other Ambulatory Visit (HOSPITAL_COMMUNITY): Payer: Self-pay | Admitting: Hematology

## 2020-06-13 DIAGNOSIS — Z7901 Long term (current) use of anticoagulants: Secondary | ICD-10-CM

## 2020-06-16 ENCOUNTER — Other Ambulatory Visit: Payer: Self-pay

## 2020-06-16 ENCOUNTER — Encounter: Payer: Self-pay | Admitting: Nurse Practitioner

## 2020-06-16 ENCOUNTER — Ambulatory Visit (INDEPENDENT_AMBULATORY_CARE_PROVIDER_SITE_OTHER): Payer: Medicare HMO | Admitting: Nurse Practitioner

## 2020-06-16 VITALS — Ht 64.0 in

## 2020-06-16 DIAGNOSIS — Z0001 Encounter for general adult medical examination with abnormal findings: Secondary | ICD-10-CM | POA: Diagnosis not present

## 2020-06-16 DIAGNOSIS — Z7901 Long term (current) use of anticoagulants: Secondary | ICD-10-CM | POA: Diagnosis not present

## 2020-06-16 DIAGNOSIS — Z0000 Encounter for general adult medical examination without abnormal findings: Secondary | ICD-10-CM

## 2020-06-16 NOTE — Addendum Note (Signed)
Addended by: Demetrius Revel on: 06/16/2020 12:32 PM   Modules accepted: Miquel Dunn

## 2020-06-16 NOTE — Progress Notes (Addendum)
Subjective:   Lisa Klein is a 86 y.o. female who presents for Medicare Annual (Subsequent) preventive examination.  Review of Systems    Cardiac Risk Factors include: none     Objective:    Today's Vitals   06/16/20 0833  Height: 5\' 4"  (1.626 m)  PainSc: 0-No pain   Body mass index is 31.41 kg/m.  Advanced Directives 06/16/2020 03/11/2020 03/08/2020 09/08/2019 02/25/2019 08/21/2018 06/12/2018  Does Patient Have a Medical Advance Directive? No No No No No No No  Would patient like information on creating a medical advance directive? Yes (MAU/Ambulatory/Procedural Areas - Information given) No - Patient declined No - Patient declined No - Patient declined No - Patient declined No - Patient declined Yes (ED - Information included in AVS)  Pre-existing out of facility DNR order (yellow form or pink MOST form) - - - - - - -    Current Medications (verified) Outpatient Encounter Medications as of 06/16/2020  Medication Sig  . acetaminophen (TYLENOL) 650 MG CR tablet Take 650 mg by mouth 2 (two) times daily.   [name] anastrozole (ARIMIDEX) 1 MG tablet TAKE 1 TABLET AT BEDTIME  . aspirin EC 81 MG tablet Take 81 mg by mouth daily.  [name] azelastine (ASTELIN) 0.1 % nasal spray Place 2 sprays into both nostrils 2 (two) times daily. Use in each nostril as directed  . Biotin w/ Vitamins C & E (HAIR/SKIN/NAILS PO) Take by mouth.  . calcium-vitamin D (OSCAL WITH D) 500-200 MG-UNIT tablet Take 1 tablet by mouth 2 (two) times daily.  [name] Cod Liver Oil 1000 MG CAPS Take 1 capsule by mouth daily.   [name] denosumab (PROLIA) 60 MG/ML SOLN injection Inject 60 mg into the skin every 6 (six) months. Administer in upper arm, thigh, or abdomen  . docusate sodium (COLACE) 100 MG capsule Take 100 mg by mouth daily as needed for constipation. For constipation  . hydrochlorothiazide (MICROZIDE) 12.5 MG capsule TAKE 1 CAPSULE EVERY MORNING  . levothyroxine (SYNTHROID) 100 MCG tablet TAKE 1 TABLET (100 MCG TOTAL) BY MOUTH  EVERY MORNING.  [name] lovastatin (MEVACOR) 20 MG tablet TAKE 1 TABLET (20 MG TOTAL) BY MOUTH AT BEDTIME.  [name] potassium chloride (KLOR-CON) 10 MEQ tablet TAKE 1 TABLET (10 MEQ TOTAL) BY MOUTH 2 (TWO) TIMES DAILY.  [name] potassium chloride (KLOR-CON) 10 MEQ tablet TAKE 1 TABLET TWICE DAILY  . pyridOXINE (VITAMIN B-6) 100 MG tablet Take 100 mg by mouth daily.  . vitamin E 1000 UNIT capsule Take 1,000 Units by mouth daily.  Alveda Reasons 20 MG TABS tablet TAKE 1 TABLET DAILY WITH SUPPER.  . [DISCONTINUED] FLUZONE HIGH-DOSE QUADRIVALENT 0.7 ML SUSY  (Patient not taking: Reported on 06/16/2020)   Facility-Administered Encounter Medications as of 06/16/2020  Medication  . heparin lock flush 100 unit/mL  . heparin lock flush 100 unit/mL  . sodium chloride 0.9 % injection 10 mL  . sodium chloride 0.9 % injection 10 mL    Allergies (verified) Listerine [antiseptic mouth rinse] and Singulair [montelukast sodium]   History: Past Medical History:  Diagnosis Date  . Cancer ([place]) 2012   LEFT BREAST  . Clotting disorder ([place]) 05/2011   dvt, right leg  . Degenerative disc disease    with nerve compression   . DJD (degenerative joint disease)   . Hypertension   . Hypertension   . Hypothyroidism   . Hypothyroidism   . Long term current use of anticoagulant therapy 12/07/2014   Pulmonary embolus   .  Obesity   . Osteopenia 08/24/2014   Past Surgical History:  Procedure Laterality Date  . ABDOMINAL HYSTERECTOMY  1980   fibroids  . BREAST SURGERY  2012   left total mastectomy  . CHOLECYSTECTOMY  80'S   [facility]  . CHOLECYSTECTOMY    . HIP PINNING,CANNULATED Left 01/19/2013   Procedure: CANNULATED HIP PINNING-  left;  Surgeon: Marin Shutter, MD;  Location: [facility];  Service: Orthopedics;  Laterality: Left;  [name] MASTECTOMY MODIFIED RADICAL  02/14/11   left  . PORT-A-CATH REMOVAL N/A 12/05/2012   Procedure: MINOR REMOVAL PORT-A-CATH;  Surgeon: Jamesetta So, MD;  Location: [facility];  Service: General;  Laterality:  N/A;  In Minor Room  . PORTACATH PLACEMENT  04/16/2011   Procedure: INSERTION PORT-A-CATH;  Surgeon: Jamesetta So;  Location: [facility];  Service: General;  Laterality: Right;  right subclavian  . VESICOVAGINAL FISTULA CLOSURE W/ TAH     [facility]   Family History  Problem Relation Age of Onset  . COPD Father   . Lung disease Father   . Hypertension Sister   . Arthritis Mother   . Hypertension Sister   . Heart disease Son        stent  . Anesthesia problems Neg Hx   . Malignant hyperthermia Neg Hx   . Hypotension Neg Hx   . Pseudochol deficiency Neg Hx    Social History   Socioeconomic History  . Marital status: Widowed    Spouse name: Not on file  . Number of children: 2  . Years of education: 31  . Highest education level: 12th grade  Occupational History  . Occupation: [employer] part time   . Occupation: retired/disabled   Tobacco Use  . Smoking status: Never Smoker  . Smokeless tobacco: Never Used  Substance and Sexual Activity  . Alcohol use: No  . Drug use: No  . Sexual activity: Not Currently    Birth control/protection: Post-menopausal  Other Topics Concern  . Not on file  Social History Narrative   Reports starting to have a lot of trouble doing things for herself. Lives alone, tries to drive to places, but it is getting increasingly harder to do.    Social Determinants of Health   Financial Resource Strain: Low Risk   . Difficulty of Paying Living Expenses: Not hard at all  Food Insecurity: No Food Insecurity  . Worried About Charity fundraiser in the Last Year: Never true  . Ran Out of Food in the Last Year: Never true  Transportation Needs: No Transportation Needs  . Lack of Transportation (Medical): No  . Lack of Transportation (Non-Medical): No  Physical Activity: Insufficiently Active  . Days of Exercise per Week: 5 days  . Minutes of Exercise per Session: 10 min  Stress: No Stress Concern Present  . Feeling of Stress : Not at all  Social Connections:  Moderately Isolated  . Frequency of Communication with Friends and Family: More than three times a week  . Frequency of Social Gatherings with Friends and Family: More than three times a week  . Attends Religious Services: More than 4 times per year  . Active Member of Clubs or Organizations: No  . Attends Archivist Meetings: Never  . Marital Status: Widowed    Tobacco Counseling Counseling given: Not Answered   Clinical Intake:  Pre-visit preparation completed: Yes  Pain : No/denies pain Pain Score: 0-No pain     BMI - recorded: 31.4 Nutritional Status:  BMI > 30  Obese Nutritional Risks: None Diabetes: No  How often do you need to have someone help you when you read instructions, pamphlets, or other written materials from your doctor or pharmacy?: 1 - Never What is the last grade level you completed in school?: 12  Diabetic?no  Interpreter Needed?: No      Activities of Daily Living In your present state of health, do you have any difficulty performing the following activities: 06/16/2020  Hearing? N  Vision? N  Difficulty concentrating or making decisions? N  Walking or climbing stairs? Y  Dressing or bathing? N  Doing errands, shopping? N  Preparing Food and eating ? N  Using the Toilet? N  In the past six months, have you accidently leaked urine? N  Do you have problems with loss of bowel control? N  Managing your Medications? N  Managing your Finances? N  Housekeeping or managing your Housekeeping? N  Some recent data might be hidden    Patient Care Team: [name], MD as PCP - General Twana First, MD as Consulting Physician (Oncology) Sheldon Silvan, Manon Hilding, PA-C as Physician Assistant (Oncology)  Indicate any recent Medical Services you may have received from other than [facility] providers in the past year (date may be approximate).     Assessment:   This is a routine wellness examination for Lisa Klein.  Hearing/Vision screen No exam  data present  Dietary issues and exercise activities discussed: Current Exercise Habits: Home exercise routine, Type of exercise: walking, Time (Minutes): 10, Frequency (Times/Week): 5, Weekly Exercise (Minutes/Week): 50, Intensity: Mild, Exercise limited by: None identified  Goals    . Exercise 3x per week (30 min per time)     Starting today 07/25/2016 patient would like to increase exercise to at least 30 minutes 3 times a week.     [name] LIFESTYLE - DECREASE FALLS RISK    . Patient Stated     Would like to stay active to be able to keep doing for herself       Depression Screen PHQ 2/9 Scores 06/16/2020 06/16/2020 02/15/2020 08/10/2019 06/16/2019 02/05/2019 06/12/2018  PHQ - 2 Score 0 0 0 0 0 0 0    Fall Risk Fall Risk  06/16/2020 02/15/2020 08/10/2019 06/16/2019 02/05/2019  Falls in the past year? 0 0 1 0 1  Number falls in past yr: 0 0 1 0 0  Injury with Fall? 0 0 1 0 0  Risk Factor Category  - - - - -  Risk for fall due to : No Fall Risks No Fall Risks - - -  Follow up Falls evaluation completed Falls evaluation completed - - -    Any stairs in or around the home? No  If so, are there any without handrails? No  Home free of loose throw rugs in walkways, pet beds, electrical cords, etc? Yes  Adequate lighting in your home to reduce risk of falls? Yes   ASSISTIVE DEVICES UTILIZED TO PREVENT FALLS:  Life alert? Yes  Use of a cane, walker or w/c? Yes  Grab bars in the bathroom? Yes  Shower chair or bench in shower? Yes  Elevated toilet seat or a handicapped toilet? Yes   TIMED UP AND GO:  Was the test performed? No .  Length of time to ambulate 10 feet: NA sec.     Cognitive Function: MMSE - Mini Mental State Exam 06/16/2020  Not completed: Unable to complete     6CIT Screen 06/16/2020 06/16/2019  06/12/2018 05/29/2017 07/25/2016  What Year? 0 points 0 points 0 points 0 points 0 points  What month? 0 points 0 points 0 points 0 points 0 points  What time? 0 points 0 points 0  points 0 points 0 points  Count back from 20 0 points 0 points 0 points 0 points 0 points  Months in reverse 0 points 0 points 0 points 0 points 0 points  Repeat phrase 0 points 0 points 0 points 0 points 0 points  Total Score 0 0 0 0 0    Immunizations Immunization History  Administered Date(s) Administered  . Fluad Quad(high Dose 65+) 06/11/2019  . Influenza Whole 06/06/2009, 05/17/2010  . Influenza,inj,Quad PF,6+ Mos 06/29/2013, 07/15/2014, 05/02/2015, 07/25/2016, 04/18/2017, 04/02/2018  . Moderna SARS-COVID-2 Vaccination 10/06/2019, 11/03/2019  . Pneumococcal Conjugate-13 03/03/2014  . Pneumococcal Polysaccharide-23 08/07/2001  . Td 02/29/2004  . Tdap 11/13/2010  . Zoster 10/07/2006    TDAP status: Up to date Flu Vaccine status: Up to date Pneumococcal vaccine status: Up to date Covid-19 vaccine status: Completed vaccines  Qualifies for Shingles Vaccine? Yes   Zostavax completed Yes   Shingrix Completed?: No.    Education has been provided regarding the importance of this vaccine. Patient has been advised to call insurance company to determine out of pocket expense if they have not yet received this vaccine. Advised may also receive vaccine at local pharmacy or Health Dept. Verbalized acceptance and understanding.  Screening Tests Health Maintenance  Topic Date Due  . INFLUENZA VACCINE  03/06/2020  . TETANUS/TDAP  11/12/2020  . DEXA SCAN  Completed  . COVID-19 Vaccine  Completed  . PNA vac Low Risk Adult  Completed    Health Maintenance  Health Maintenance Due  Topic Date Due  . INFLUENZA VACCINE  03/06/2020    Colorectal cancer screening: No longer required.  Mammogram status: Completed 08-2019. Repeat every year Bone Density status: Completed 5 years ago. Results reflect: Bone density results: NORMAL. Repeat every 5 years.  Lung Cancer Screening: (Low Dose CT Chest recommended if Age 73-80 years, 30 pack-year currently smoking OR have quit w/in 15years.) does  not qualify.   Lung Cancer Screening Referral: no  Additional Screening:  Hepatitis C Screening: does not qualify; Completed   Vision Screening: Recommended annual ophthalmology exams for early detection of glaucoma and other disorders of the eye. Is the patient up to date with their annual eye exam?  Yes  Who is the provider or what is the name of the office in which the patient attends annual eye exams? In [place]  If pt is not established with a provider, would they like to be referred to a provider to establish care? No .   Dental Screening: Recommended annual dental exams for proper oral hygiene  Community Resource Referral / Chronic Care Management: CRR required this visit?  No   CCM required this visit?  Yes, she states Xarelto is expensive, and she would like to talk to CCM about this.     Plan:     I have personally reviewed and noted the following in the patient's chart:   . Medical and social history . Use of alcohol, tobacco or illicit drugs  . Current medications and supplements . Functional ability and status . Nutritional status . Physical activity . Advanced directives . List of other physicians . Hospitalizations, surgeries, and ER visits in previous 12 months . Vitals . Screenings to include cognitive, depression, and falls . Referrals and appointments  In addition,  I have reviewed and discussed with patient certain preventive protocols, quality metrics, and best practice recommendations. A written personalized care plan for preventive services as well as general preventive health recommendations were provided to patient.     Noreene Larsson, NP   06/16/2020   Choose 1 Note Type (Telehealth Visit or Telephone Visit):[phone]}    Date:  06/16/20   Location of Patient: Home Location of Provider: Office Consent was obtain for visit to be over via telehealth. I verified that I am speaking with the correct person using two identifiers.  I  connected with  Lisa Klein on 06/16/20 via telephone and verified that I am speaking with the correct person using two identifiers.   I discussed the limitations of evaluation and management by telemedicine. The patient expressed understanding and agreed to proceed.   AWV questions answered by Lonn Georgia, LPN.  I called the patient to confirm the responses to the AWV questions and addressed all concerns.

## 2020-06-16 NOTE — Progress Notes (Deleted)
  Lisa Klein , Thank you for taking time to come for your Medicare Wellness Visit. I appreciate your ongoing commitment to your health goals. Please review the following plan we discussed and let me know if I can assist you in the future.   These are the goals we discussed: Goals    . Exercise 3x per week (30 min per time)     Starting today 07/25/2016 patient would like to increase exercise to at least 30 minutes 3 times a week.     [name] LIFESTYLE - DECREASE FALLS RISK       This is a list of the screening recommended for you and due dates:  Health Maintenance  Topic Date Due  . Flu Shot  03/06/2020  . Tetanus Vaccine  11/12/2020  . DEXA scan (bone density measurement)  Completed  . COVID-19 Vaccine  Completed  . Pneumonia vaccines  Completed    1. Encounter for Medicare annual wellness exam  I have personally reviewed and noted the following in the patient's chart:    Medical and social history  Use of alcohol, tobacco or illicit drugs   Current medications and supplements  Functional ability and status  Nutritional status  Physical activity  Advanced directives  List of other physicians  Hospitalizations, surgeries, and ER visits in previous 12 months  Vitals  Screenings to include cognitive, depression, and falls  Referrals and appointments  In addition, I have reviewed and discussed with patient certain preventive protocols, quality metrics, and best practice recommendations. A written personalized care plan for preventive services as well as general preventive health recommendations were provided to patient.

## 2020-06-16 NOTE — Progress Notes (Deleted)
Subjective:   Lisa Klein is a 86 y.o. female who presents for Medicare Annual (Subsequent) preventive examination.  Review of Systems    Cardiac Risk Factors include: none     Objective:    Today's Vitals   06/16/20 0833  Height: 5\' 4"  (1.626 m)  PainSc: 0-No pain   Body mass index is 31.41 kg/m.  Advanced Directives 06/16/2020 03/11/2020 03/08/2020 09/08/2019 02/25/2019 08/21/2018 06/12/2018  Does Patient Have a Medical Advance Directive? No No No No No No No  Would patient like information on creating a medical advance directive? Yes (MAU/Ambulatory/Procedural Areas - Information given) No - Patient declined No - Patient declined No - Patient declined No - Patient declined No - Patient declined Yes (ED - Information included in AVS)  Pre-existing out of facility DNR order (yellow form or pink MOST form) - - - - - - -    Current Medications (verified) Outpatient Encounter Medications as of 06/16/2020  Medication Sig  . acetaminophen (TYLENOL) 650 MG CR tablet Take 650 mg by mouth 2 (two) times daily.   [name] anastrozole (ARIMIDEX) 1 MG tablet TAKE 1 TABLET AT BEDTIME  . aspirin EC 81 MG tablet Take 81 mg by mouth daily.  [name] azelastine (ASTELIN) 0.1 % nasal spray Place 2 sprays into both nostrils 2 (two) times daily. Use in each nostril as directed  . Biotin w/ Vitamins C & E (HAIR/SKIN/NAILS PO) Take by mouth.  . calcium-vitamin D (OSCAL WITH D) 500-200 MG-UNIT tablet Take 1 tablet by mouth 2 (two) times daily.  [name] Cod Liver Oil 1000 MG CAPS Take 1 capsule by mouth daily.   [name] denosumab (PROLIA) 60 MG/ML SOLN injection Inject 60 mg into the skin every 6 (six) months. Administer in upper arm, thigh, or abdomen  . docusate sodium (COLACE) 100 MG capsule Take 100 mg by mouth daily as needed for constipation. For constipation  . hydrochlorothiazide (MICROZIDE) 12.5 MG capsule TAKE 1 CAPSULE EVERY MORNING  . levothyroxine (SYNTHROID) 100 MCG tablet TAKE 1 TABLET (100 MCG TOTAL) BY MOUTH  EVERY MORNING.  [name] lovastatin (MEVACOR) 20 MG tablet TAKE 1 TABLET (20 MG TOTAL) BY MOUTH AT BEDTIME.  [name] potassium chloride (KLOR-CON) 10 MEQ tablet TAKE 1 TABLET (10 MEQ TOTAL) BY MOUTH 2 (TWO) TIMES DAILY.  [name] potassium chloride (KLOR-CON) 10 MEQ tablet TAKE 1 TABLET TWICE DAILY  . pyridOXINE (VITAMIN B-6) 100 MG tablet Take 100 mg by mouth daily.  . vitamin E 1000 UNIT capsule Take 1,000 Units by mouth daily.  Alveda Reasons 20 MG TABS tablet TAKE 1 TABLET DAILY WITH SUPPER.  . [DISCONTINUED] FLUZONE HIGH-DOSE QUADRIVALENT 0.7 ML SUSY  (Patient not taking: Reported on 06/16/2020)   Facility-Administered Encounter Medications as of 06/16/2020  Medication  . heparin lock flush 100 unit/mL  . heparin lock flush 100 unit/mL  . sodium chloride 0.9 % injection 10 mL  . sodium chloride 0.9 % injection 10 mL    Allergies (verified) Listerine [antiseptic mouth rinse] and Singulair [montelukast sodium]   History: Past Medical History:  Diagnosis Date  . Cancer ([place]) 2012   LEFT BREAST  . Clotting disorder ([place]) 05/2011   dvt, right leg  . Degenerative disc disease    with nerve compression   . DJD (degenerative joint disease)   . Hypertension   . Hypertension   . Hypothyroidism   . Hypothyroidism   . Long term current use of anticoagulant therapy 12/07/2014   Pulmonary embolus   .  Obesity   . Osteopenia 08/24/2014   Past Surgical History:  Procedure Laterality Date  . ABDOMINAL HYSTERECTOMY  1980   fibroids  . BREAST SURGERY  2012   left total mastectomy  . CHOLECYSTECTOMY  80'S   [facility]  . CHOLECYSTECTOMY    . HIP PINNING,CANNULATED Left 01/19/2013   Procedure: CANNULATED HIP PINNING-  left;  Surgeon: Marin Shutter, MD;  Location: [facility];  Service: Orthopedics;  Laterality: Left;  [name] MASTECTOMY MODIFIED RADICAL  02/14/11   left  . PORT-A-CATH REMOVAL N/A 12/05/2012   Procedure: MINOR REMOVAL PORT-A-CATH;  Surgeon: Jamesetta So, MD;  Location: [facility];  Service: General;  Laterality:  N/A;  In Minor Room  . PORTACATH PLACEMENT  04/16/2011   Procedure: INSERTION PORT-A-CATH;  Surgeon: Jamesetta So;  Location: [facility];  Service: General;  Laterality: Right;  right subclavian  . VESICOVAGINAL FISTULA CLOSURE W/ TAH     [facility]   Family History  Problem Relation Age of Onset  . COPD Father   . Lung disease Father   . Hypertension Sister   . Arthritis Mother   . Hypertension Sister   . Heart disease Son        stent  . Anesthesia problems Neg Hx   . Malignant hyperthermia Neg Hx   . Hypotension Neg Hx   . Pseudochol deficiency Neg Hx    Social History   Socioeconomic History  . Marital status: Widowed    Spouse name: Not on file  . Number of children: 2  . Years of education: 55  . Highest education level: 12th grade  Occupational History  . Occupation: [employer] part time   . Occupation: retired/disabled   Tobacco Use  . Smoking status: Never Smoker  . Smokeless tobacco: Never Used  Substance and Sexual Activity  . Alcohol use: No  . Drug use: No  . Sexual activity: Not Currently    Birth control/protection: Post-menopausal  Other Topics Concern  . Not on file  Social History Narrative   Reports starting to have a lot of trouble doing things for herself. Lives alone, tries to drive to places, but it is getting increasingly harder to do.    Social Determinants of Health   Financial Resource Strain: Low Risk   . Difficulty of Paying Living Expenses: Not hard at all  Food Insecurity: No Food Insecurity  . Worried About Charity fundraiser in the Last Year: Never true  . Ran Out of Food in the Last Year: Never true  Transportation Needs: No Transportation Needs  . Lack of Transportation (Medical): No  . Lack of Transportation (Non-Medical): No  Physical Activity: Insufficiently Active  . Days of Exercise per Week: 5 days  . Minutes of Exercise per Session: 10 min  Stress: No Stress Concern Present  . Feeling of Stress : Not at all  Social Connections:  Moderately Isolated  . Frequency of Communication with Friends and Family: More than three times a week  . Frequency of Social Gatherings with Friends and Family: More than three times a week  . Attends Religious Services: More than 4 times per year  . Active Member of Clubs or Organizations: No  . Attends Archivist Meetings: Never  . Marital Status: Widowed    Tobacco Counseling Counseling given: Not Answered   Clinical Intake:  Pre-visit preparation completed: Yes  Pain : No/denies pain Pain Score: 0-No pain     BMI - recorded: 31.4 Nutritional Status:  BMI > 30  Obese Nutritional Risks: None Diabetes: No  How often do you need to have someone help you when you read instructions, pamphlets, or other written materials from your doctor or pharmacy?: 1 - Never What is the last grade level you completed in school?: 12  Diabetic?no  Interpreter Needed?: No      Activities of Daily Living In your present state of health, do you have any difficulty performing the following activities: 06/16/2020  Hearing? N  Vision? N  Difficulty concentrating or making decisions? N  Walking or climbing stairs? Y  Dressing or bathing? N  Doing errands, shopping? N  Preparing Food and eating ? N  Using the Toilet? N  In the past six months, have you accidently leaked urine? N  Do you have problems with loss of bowel control? N  Managing your Medications? N  Managing your Finances? N  Housekeeping or managing your Housekeeping? N  Some recent data might be hidden    Patient Care Team: [name], MD as PCP - General Twana First, MD as Consulting Physician (Oncology) Sheldon Silvan, Manon Hilding, PA-C as Physician Assistant (Oncology)  Indicate any recent Medical Services you may have received from other than [facility] providers in the past year (date may be approximate).     Assessment:   This is a routine wellness examination for [place].  Hearing/Vision screen No exam  data present  Dietary issues and exercise activities discussed: Current Exercise Habits: Home exercise routine, Type of exercise: walking, Time (Minutes): 10, Frequency (Times/Week): 5, Weekly Exercise (Minutes/Week): 50, Intensity: Mild, Exercise limited by: None identified  Goals    . Exercise 3x per week (30 min per time)     Starting today 07/25/2016 patient would like to increase exercise to at least 30 minutes 3 times a week.     [name] LIFESTYLE - DECREASE FALLS RISK    . Patient Stated     Would like to stay active to be able to keep doing for herself       Depression Screen PHQ 2/9 Scores 06/16/2020 06/16/2020 02/15/2020 08/10/2019 06/16/2019 02/05/2019 06/12/2018  PHQ - 2 Score 0 0 0 0 0 0 0    Fall Risk Fall Risk  06/16/2020 02/15/2020 08/10/2019 06/16/2019 02/05/2019  Falls in the past year? 0 0 1 0 1  Number falls in past yr: 0 0 1 0 0  Injury with Fall? 0 0 1 0 0  Risk Factor Category  - - - - -  Risk for fall due to : No Fall Risks No Fall Risks - - -  Follow up Falls evaluation completed Falls evaluation completed - - -    Any stairs in or around the home? No  If so, are there any without handrails? No  Home free of loose throw rugs in walkways, pet beds, electrical cords, etc? Yes  Adequate lighting in your home to reduce risk of falls? Yes   ASSISTIVE DEVICES UTILIZED TO PREVENT FALLS:  Life alert? Yes  Use of a cane, walker or w/c? Yes  Grab bars in the bathroom? Yes  Shower chair or bench in shower? Yes  Elevated toilet seat or a handicapped toilet? Yes   TIMED UP AND GO:  Was the test performed? No .  Length of time to ambulate 10 feet: NA sec.     Cognitive Function: MMSE - Mini Mental State Exam 06/16/2020  Not completed: Unable to complete     6CIT Screen 06/16/2020 06/16/2019  06/12/2018 05/29/2017 07/25/2016  What Year? 0 points 0 points 0 points 0 points 0 points  What month? 0 points 0 points 0 points 0 points 0 points  What time? 0 points 0 points 0  points 0 points 0 points  Count back from 20 0 points 0 points 0 points 0 points 0 points  Months in reverse 0 points 0 points 0 points 0 points 0 points  Repeat phrase 0 points 0 points 0 points 0 points 0 points  Total Score 0 0 0 0 0    Immunizations Immunization History  Administered Date(s) Administered  . Fluad Quad(high Dose 65+) 06/11/2019  . Influenza Whole 06/06/2009, 05/17/2010  . Influenza,inj,Quad PF,6+ Mos 06/29/2013, 07/15/2014, 05/02/2015, 07/25/2016, 04/18/2017, 04/02/2018  . Moderna SARS-COVID-2 Vaccination 10/06/2019, 11/03/2019  . Pneumococcal Conjugate-13 03/03/2014  . Pneumococcal Polysaccharide-23 08/07/2001  . Td 02/29/2004  . Tdap 11/13/2010  . Zoster 10/07/2006    TDAP status: Up to date Flu Vaccine status: Up to date Pneumococcal vaccine status: Up to date Covid-19 vaccine status: Completed vaccines  Qualifies for Shingles Vaccine? Yes   Zostavax completed Yes   Shingrix Completed?: No.    Education has been provided regarding the importance of this vaccine. Patient has been advised to call insurance company to determine out of pocket expense if they have not yet received this vaccine. Advised may also receive vaccine at local pharmacy or Health Dept. Verbalized acceptance and understanding.  Screening Tests Health Maintenance  Topic Date Due  . INFLUENZA VACCINE  03/06/2020  . TETANUS/TDAP  11/12/2020  . DEXA SCAN  Completed  . COVID-19 Vaccine  Completed  . PNA vac Low Risk Adult  Completed    Health Maintenance  Health Maintenance Due  Topic Date Due  . INFLUENZA VACCINE  03/06/2020    Colorectal cancer screening: No longer required.  Mammogram status: Completed 08-2019. Repeat every year Bone Density status: Completed 5 years ago. Results reflect: Bone density results: NORMAL. Repeat every 5 years.  Lung Cancer Screening: (Low Dose CT Chest recommended if Age 77-80 years, 30 pack-year currently smoking OR have quit w/in 15years.) does  not qualify.   Lung Cancer Screening Referral: no  Additional Screening:  Hepatitis C Screening: does not qualify; Completed   Vision Screening: Recommended annual ophthalmology exams for early detection of glaucoma and other disorders of the eye. Is the patient up to date with their annual eye exam?  Yes  Who is the provider or what is the name of the office in which the patient attends annual eye exams? In [place]  If pt is not established with a provider, would they like to be referred to a provider to establish care? No .   Dental Screening: Recommended annual dental exams for proper oral hygiene  Community Resource Referral / Chronic Care Management: CRR required this visit?  No   CCM required this visit?  Yes, she states Xarelto is expensive, and she would like to talk to CCM about this.     Plan:     I have personally reviewed and noted the following in the patient's chart:   . Medical and social history . Use of alcohol, tobacco or illicit drugs  . Current medications and supplements . Functional ability and status . Nutritional status . Physical activity . Advanced directives . List of other physicians . Hospitalizations, surgeries, and ER visits in previous 12 months . Vitals . Screenings to include cognitive, depression, and falls . Referrals and appointments  In addition,  I have reviewed and discussed with patient certain preventive protocols, quality metrics, and best practice recommendations. A written personalized care plan for preventive services as well as general preventive health recommendations were provided to patient.     Shelda Altes, CMA   06/16/2020   Nurse Notes: Porfirio Mylar, DNP, AGNP-C

## 2020-06-16 NOTE — Patient Instructions (Addendum)
Lisa Klein,   Preventive Care 84 Years and Older, Female Preventive care refers to lifestyle choices and visits with your health care provider that can promote health and wellness. This includes:  A yearly physical exam. This is also called an annual well check.  Regular dental and eye exams.  Immunizations.  Screening for certain conditions.  Healthy lifestyle choices, such as diet and exercise. What can I expect for my preventive care visit? Physical exam Your health care provider will check:  Height and weight. These may be used to calculate body mass index (BMI), which is a measurement that tells if you are at a healthy weight.  Heart rate and blood pressure.  Your skin for abnormal spots. Counseling Your health care provider may ask you questions about:  Alcohol, tobacco, and drug use.  Emotional well-being.  Home and relationship well-being.  Sexual activity.  Eating habits.  History of falls.  Memory and ability to understand (cognition).  Work and work Statistician.  Pregnancy and menstrual history. What immunizations do I need?   Influenza (flu) vaccine  This is recommended every year. Tetanus, diphtheria, and pertussis (Tdap) vaccine  You may need a Td booster every 10 years. Varicella (chickenpox) vaccine  You may need this vaccine if you have not already been vaccinated. Zoster (shingles) vaccine  You may need this after age 60. Pneumococcal conjugate (PCV13) vaccine  One dose is recommended after age 58. Pneumococcal polysaccharide (PPSV23) vaccine  One dose is recommended after age 17. Measles, mumps, and rubella (MMR) vaccine  You may need at least one dose of MMR if you were born in 1957 or later. You may also need a second dose. Meningococcal conjugate (MenACWY) vaccine  You may need this if you have certain conditions. Hepatitis A vaccine  You may need this if you have certain conditions or if you travel or work in places  where you may be exposed to hepatitis A. Hepatitis B vaccine  You may need this if you have certain conditions or if you travel or work in places where you may be exposed to hepatitis B. Haemophilus influenzae type b (Hib) vaccine  You may need this if you have certain conditions. You may receive vaccines as individual doses or as more than one vaccine together in one shot (combination vaccines). Talk with your health care provider about the risks and benefits of combination vaccines. What tests do I need? Blood tests  Lipid and cholesterol levels. These may be checked every 5 years, or more frequently depending on your overall health.  Hepatitis C test.  Hepatitis B test. Screening  Lung cancer screening. You may have this screening every year starting at age 16 if you have a 30-pack-year history of smoking and currently smoke or have quit within the past 15 years.  Colorectal cancer screening. All adults should have this screening starting at age 82 and continuing until age 75. Your health care provider may recommend screening at age 47 if you are at increased risk. You will have tests every 1-10 years, depending on your results and the type of screening test.  Diabetes screening. This is done by checking your blood sugar (glucose) after you have not eaten for a while (fasting). You may have this done every 1-3 years.  Mammogram. This may be done every 1-2 years. Talk with your health care provider about how often you should have regular mammograms.  BRCA-related cancer screening. This may be done if you have a family history of breast,  ovarian, tubal, or peritoneal cancers. Other tests  Sexually transmitted disease (STD) testing.  Bone density scan. This is done to screen for osteoporosis. You may have this done starting at age 63. Follow these instructions at home: Eating and drinking  Eat a diet that includes fresh fruits and vegetables, whole grains, lean protein, and low-fat  dairy products. Limit your intake of foods with high amounts of sugar, saturated fats, and salt.  Take vitamin and mineral supplements as recommended by your health care provider.  Do not drink alcohol if your health care provider tells you not to drink.  If you drink alcohol: ? Limit how much you have to 0-1 drink a day. ? Be aware of how much alcohol is in your drink. In the [place] one drink equals one 12 oz bottle of beer (355 mL), one 5 oz glass of wine (148 mL), or one 1 oz glass of hard liquor (44 mL). Lifestyle  Take daily care of your teeth and gums.  Stay active. Exercise for at least 30 minutes on 5 or more days each week.  Do not use any products that contain nicotine or tobacco, such as cigarettes, e-cigarettes, and chewing tobacco. If you need help quitting, ask your health care provider.  If you are sexually active, practice safe sex. Use a condom or other form of protection in order to prevent STIs (sexually transmitted infections).  Talk with your health care provider about taking a low-dose aspirin or statin. What's next?  Go to your health care provider once a year for a well check visit.  Ask your health care provider how often you should have your eyes and teeth checked.  Stay up to date on all vaccines.

## 2020-06-21 ENCOUNTER — Other Ambulatory Visit (HOSPITAL_COMMUNITY): Payer: Self-pay | Admitting: Surgery

## 2020-06-21 ENCOUNTER — Telehealth (HOSPITAL_COMMUNITY): Payer: Self-pay | Admitting: Surgery

## 2020-06-21 DIAGNOSIS — C50912 Malignant neoplasm of unspecified site of left female breast: Secondary | ICD-10-CM

## 2020-06-21 DIAGNOSIS — Z7901 Long term (current) use of anticoagulants: Secondary | ICD-10-CM

## 2020-06-21 MED ORDER — APIXABAN 5 MG PO TABS: 5.0000 mg | ORAL_TABLET | Freq: Two times a day (BID) | ORAL | 6 refills | Status: DC

## 2020-06-21 NOTE — Telephone Encounter (Signed)
We had received a notice of denial of drug coverage from [facility] this morning regarding the pt's Xarelto 20 mg tablets.  I called [employer] to see which medications were covered, and Eliquis 5 mg was one of the covered medications.  I notified Dr. Delton Coombes, and he ordered the pt to start taking Eliquis 5 mg BID the day after she finishes her Xarelto.  I called the pt and she verbalized understanding of this change, and she was told to call back if she had any questions.

## 2020-06-24 DIAGNOSIS — H401232 Low-tension glaucoma, bilateral, moderate stage: Secondary | ICD-10-CM | POA: Diagnosis not present

## 2020-06-28 ENCOUNTER — Other Ambulatory Visit (HOSPITAL_COMMUNITY): Payer: Self-pay

## 2020-06-29 ENCOUNTER — Other Ambulatory Visit (HOSPITAL_COMMUNITY): Payer: Self-pay

## 2020-06-29 DIAGNOSIS — Z7901 Long term (current) use of anticoagulants: Secondary | ICD-10-CM

## 2020-06-29 DIAGNOSIS — C50912 Malignant neoplasm of unspecified site of left female breast: Secondary | ICD-10-CM

## 2020-06-29 MED ORDER — RIVAROXABAN 20 MG PO TABS: 20.0000 mg | ORAL_TABLET | Freq: Every day | ORAL | 1 refills | Status: DC

## 2020-06-29 NOTE — Progress Notes (Signed)
Spoke with patients daughter, who requested that Eliquis be discontinued and Xarelto be restarted due to cost of medications.  Given verbal order from Dr. Delton Coombes to reorder Xarelto 20 mg.  Daughter made aware of prescription being submitted.

## 2020-07-07 ENCOUNTER — Other Ambulatory Visit: Payer: Self-pay | Admitting: Family Medicine

## 2020-07-16 ENCOUNTER — Other Ambulatory Visit: Payer: Self-pay | Admitting: Family Medicine

## 2020-07-19 ENCOUNTER — Other Ambulatory Visit (HOSPITAL_COMMUNITY): Payer: Self-pay

## 2020-07-19 ENCOUNTER — Other Ambulatory Visit: Payer: Self-pay | Admitting: Hematology

## 2020-07-19 DIAGNOSIS — Z7901 Long term (current) use of anticoagulants: Secondary | ICD-10-CM

## 2020-07-19 MED ORDER — RIVAROXABAN 20 MG PO TABS: 20.0000 mg | ORAL_TABLET | Freq: Every day | ORAL | 1 refills | Status: DC

## 2020-07-19 NOTE — Telephone Encounter (Signed)
Received call from patients daughter.  Stated refill for Xarelto routed to the incorrect pharmacy.  Prescription should be ordered through [facility]  Refill request has been resubmitted.  Advised daughter, Atiana Levier, refill has been resubmitted to [facility].  No further questions or concerns at this time.

## 2020-07-19 NOTE — Progress Notes (Signed)
I re-sent to [facility].   Truitt Merle MD

## 2020-07-20 ENCOUNTER — Telehealth (HOSPITAL_COMMUNITY): Payer: Self-pay

## 2020-07-20 NOTE — Telephone Encounter (Signed)
This nurse called and spoke with daughter and informed her that the [place] prescription was resubmitted to the correct pharmacy which is  [facility] on 12/14 and they have acknowledged receipt of the prescription.  No further questions or concerns at this time.

## 2020-08-12 ENCOUNTER — Ambulatory Visit (HOSPITAL_COMMUNITY): Payer: Medicare HMO

## 2020-08-15 ENCOUNTER — Ambulatory Visit (HOSPITAL_COMMUNITY)
Admission: RE | Admit: 2020-08-15 | Discharge: 2020-08-15 | Disposition: A | Discharge status: Home / Self Care | Payer: Medicare HMO | Source: Ambulatory Visit | Attending: Nurse Practitioner | Admitting: Nurse Practitioner

## 2020-08-15 ENCOUNTER — Other Ambulatory Visit: Payer: Self-pay

## 2020-08-15 DIAGNOSIS — Z1231 Encounter for screening mammogram for malignant neoplasm of breast: Secondary | ICD-10-CM

## 2020-08-15 DIAGNOSIS — Z13820 Encounter for screening for osteoporosis: Secondary | ICD-10-CM | POA: Diagnosis not present

## 2020-08-15 DIAGNOSIS — M8580 Other specified disorders of bone density and structure, unspecified site: Secondary | ICD-10-CM

## 2020-08-15 DIAGNOSIS — M8589 Other specified disorders of bone density and structure, multiple sites: Secondary | ICD-10-CM | POA: Diagnosis not present

## 2020-08-15 DIAGNOSIS — M85852 Other specified disorders of bone density and structure, left thigh: Secondary | ICD-10-CM | POA: Diagnosis not present

## 2020-08-15 DIAGNOSIS — Z780 Asymptomatic menopausal state: Secondary | ICD-10-CM | POA: Diagnosis not present

## 2020-08-15 DIAGNOSIS — Z853 Personal history of malignant neoplasm of breast: Secondary | ICD-10-CM | POA: Diagnosis not present

## 2020-08-15 DIAGNOSIS — Z9012 Acquired absence of left breast and nipple: Secondary | ICD-10-CM | POA: Diagnosis not present

## 2020-08-15 DIAGNOSIS — R29890 Loss of height: Secondary | ICD-10-CM | POA: Diagnosis not present

## 2020-08-17 ENCOUNTER — Telehealth (INDEPENDENT_AMBULATORY_CARE_PROVIDER_SITE_OTHER): Payer: Medicare HMO | Admitting: Family Medicine

## 2020-08-17 ENCOUNTER — Encounter: Payer: Self-pay | Admitting: Family Medicine

## 2020-08-17 ENCOUNTER — Other Ambulatory Visit: Payer: Self-pay

## 2020-08-17 DIAGNOSIS — Z7901 Long term (current) use of anticoagulants: Secondary | ICD-10-CM

## 2020-08-17 DIAGNOSIS — I10 Essential (primary) hypertension: Secondary | ICD-10-CM

## 2020-08-17 DIAGNOSIS — E039 Hypothyroidism, unspecified: Secondary | ICD-10-CM

## 2020-08-17 DIAGNOSIS — E7849 Other hyperlipidemia: Secondary | ICD-10-CM

## 2020-08-17 NOTE — Assessment & Plan Note (Signed)
Updated lab needed at/ before next visit.   

## 2020-08-17 NOTE — Progress Notes (Signed)
Virtual Visit via Telephone Note  I connected with Lisa Klein on 08/17/20 at  9:40 AM EST by telephone and verified that I am speaking with the correct person using two identifiers.  Location: Patient: home Provider: work   I discussed the limitations, risks, security and privacy concerns of performing an evaluation and management service by telephone and the availability of in person appointments. I also discussed with the patient that there may be a patient responsible charge related to this service. The patient expressed understanding and agreed to proceed.   History of Present Illness: F/U chronic problems, medication and lab review. No concerns, doing well overall Good appetite and regular BM Keeping active with puzzles, crochet, word search, wants to learn knitting Denies recent fever or chills. Denies sinus pressure, nasal congestion, ear pain or sore throat. Denies chest congestion, productive cough or wheezing. Denies chest pains, palpitations and leg swelling Denies abdominal pain, nausea, vomiting,diarrhea or constipation.   Denies dysuria, frequency, hesitancy or incontinence. Denies uncontrolled  joint pain, swelling and limitation in mobility. Denies headaches, seizures, numbness, or tingling. Denies depression, anxiety or insomnia. Denies skin break down or rash.      Observations/Objective: There were no vitals taken for this visit. Good communication with no confusion and intact memory. Alert and oriented x 3 No signs of respiratory distress during speech    Assessment and Plan: Hyperlipemia Hyperlipidemia:Low fat diet discussed and encouraged.   Lipid Panel  Lab Results  Component Value Date   CHOL 149 09/08/2019   HDL 57 09/08/2019   LDLCALC 81 09/08/2019   TRIG 55 09/08/2019   CHOLHDL 2.6 09/08/2019   Updated lab needed at/ before next visit.     Essential hypertension DASH diet and commitment to daily physical activity for a minimum  of 30 minutes discussed and encouraged, as a part of hypertension management. The importance of attaining a healthy weight is also discussed. Updated lab needed at/ before next visit.   BP/Weight 06/16/2020 04/07/2020 03/11/2020 03/08/2020 03/01/2020 3/66/4403 11/10/4257  Systolic BP - 563 875 643 329 518 841  Diastolic BP - 660 68 67 81 80 74  Wt. (Lbs) - 183 - 171.52 188 188.12 193  BMI 31.41 31.41 - 29.44 32.27 32.29 32.12       Hypothyroidism Updated lab needed at/ before next visit.   Long term current use of anticoagulant therapy Denies visible  Bleeding no melena or hematuria  Obesity  Patient re-educated about  the importance of commitment to a  minimum of 150 minutes of exercise per week as able.  The importance of healthy food choices with portion control discussed, as well as eating regularly and within a 12 hour window most days. The need to choose "clean , green" food 50 to 75% of the time is discussed, as well as to make water the primary drink and set a goal of 64 ounces water daily.    Weight /BMI 06/16/2020 04/07/2020 03/08/2020  WEIGHT - 183 lb 171 lb 8.3 oz  HEIGHT 5\' 4"  5\' 4"  -  BMI 31.41 kg/m2 31.41 kg/m2 29.44 kg/m2      Osteopenia Continue biannual prolia    Follow Up Instructions:    I discussed the assessment and treatment plan with the patient. The patient was provided an opportunity to ask questions and all were answered. The patient agreed with the plan and demonstrated an understanding of the instructions.   The patient was advised to call back or seek an in-person evaluation if  the symptoms worsen or if the condition fails to improve as anticipated.  I provided 20 minutes of non-face-to-face time during this encounter.   [name], MD

## 2020-08-17 NOTE — Assessment & Plan Note (Signed)
DASH diet and commitment to daily physical activity for a minimum of 30 minutes discussed and encouraged, as a part of hypertension management. The importance of attaining a healthy weight is also discussed. Updated lab needed at/ before next visit.   BP/Weight 06/16/2020 04/07/2020 03/11/2020 03/08/2020 03/01/2020 5/63/1497 0/09/6376  Systolic BP - 588 502 774 128 786 767  Diastolic BP - 209 68 67 81 80 74  Wt. (Lbs) - 183 - 171.52 188 188.12 193  BMI 31.41 31.41 - 29.44 32.27 32.29 32.12

## 2020-08-17 NOTE — Assessment & Plan Note (Signed)
Continue biannual prolia

## 2020-08-17 NOTE — Patient Instructions (Addendum)
Annual physical exam in office with MD end August, call if you need me before  Please get covid booster as soon as possible, you need this   Please get fasting lipid, cmp and EGFr, TSH in the next 1 to 2 weeks  Continue to keep active and I wish you all the best with the new skill of knitting, I will check with you on this later.  Please continue to be careful not to fall, keep house well lit, and clutter free  Thanks for choosing  [facility], we consider it a privelige to serve you.

## 2020-08-17 NOTE — Assessment & Plan Note (Signed)
Hyperlipidemia:Low fat diet discussed and encouraged.   Lipid Panel  Lab Results  Component Value Date   CHOL 149 09/08/2019   HDL 57 09/08/2019   LDLCALC 81 09/08/2019   TRIG 55 09/08/2019   CHOLHDL 2.6 09/08/2019   Updated lab needed at/ before next visit.

## 2020-08-17 NOTE — Assessment & Plan Note (Signed)
  Patient re-educated about  the importance of commitment to a  minimum of 150 minutes of exercise per week as able.  The importance of healthy food choices with portion control discussed, as well as eating regularly and within a 12 hour window most days. The need to choose "clean , green" food 50 to 75% of the time is discussed, as well as to make water the primary drink and set a goal of 64 ounces water daily.    Weight /BMI 06/16/2020 04/07/2020 03/08/2020  WEIGHT - 183 lb 171 lb 8.3 oz  HEIGHT 5\' 4"  5\' 4"  -  BMI 31.41 kg/m2 31.41 kg/m2 29.44 kg/m2

## 2020-08-17 NOTE — Assessment & Plan Note (Signed)
Denies visible  Bleeding no melena or hematuria

## 2020-08-24 DIAGNOSIS — I10 Essential (primary) hypertension: Secondary | ICD-10-CM | POA: Diagnosis not present

## 2020-08-24 DIAGNOSIS — E7849 Other hyperlipidemia: Secondary | ICD-10-CM | POA: Diagnosis not present

## 2020-08-25 LAB — TSH: TSH: 5.36 u[IU]/mL — ABNORMAL HIGH (ref 0.450–4.500)

## 2020-08-25 LAB — LIPID PANEL
Chol/HDL Ratio: 2.6 ratio (ref 0.0–4.4)
Cholesterol, Total: 168 mg/dL (ref 100–199)
HDL: 65 mg/dL (ref >39)
LDL Chol Calc (NIH): 90 mg/dL (ref 0–99)
Triglycerides: 66 mg/dL (ref 0–149)
VLDL Cholesterol Cal: 13 mg/dL (ref 5–40)

## 2020-08-25 LAB — CMP14+EGFR
ALT: 15 IU/L (ref 0–32)
AST: 22 IU/L (ref 0–40)
Albumin/Globulin Ratio: 1.6 (ref 1.2–2.2)
Albumin: 4.2 g/dL (ref 3.6–4.6)
Alkaline Phosphatase: 45 IU/L (ref 44–121)
BUN/Creatinine Ratio: 15 (ref 12–28)
BUN: 13 mg/dL (ref 8–27)
Bilirubin Total: 0.5 mg/dL (ref 0.0–1.2)
CO2: 22 mmol/L (ref 20–29)
Calcium: 9.6 mg/dL (ref 8.7–10.3)
Chloride: 105 mmol/L (ref 96–106)
Creatinine, Ser: 0.85 mg/dL (ref 0.57–1.00)
GFR calc Af Amer: 71 mL/min/{1.73_m2} (ref >59)
GFR calc non Af Amer: 61 mL/min/{1.73_m2} (ref >59)
Globulin, Total: 2.6 g/dL (ref 1.5–4.5)
Glucose: 99 mg/dL (ref 65–99)
Potassium: 3.8 mmol/L (ref 3.5–5.2)
Sodium: 142 mmol/L (ref 134–144)
Total Protein: 6.8 g/dL (ref 6.0–8.5)

## 2020-08-31 ENCOUNTER — Other Ambulatory Visit (HOSPITAL_COMMUNITY): Payer: Self-pay | Admitting: Hematology

## 2020-08-31 DIAGNOSIS — Z7901 Long term (current) use of anticoagulants: Secondary | ICD-10-CM

## 2020-09-08 ENCOUNTER — Ambulatory Visit (HOSPITAL_COMMUNITY): Payer: Medicare HMO

## 2020-09-08 ENCOUNTER — Ambulatory Visit (HOSPITAL_COMMUNITY): Payer: Medicare HMO | Admitting: Nurse Practitioner

## 2020-09-08 ENCOUNTER — Other Ambulatory Visit (HOSPITAL_COMMUNITY): Payer: Medicare HMO

## 2020-09-15 ENCOUNTER — Other Ambulatory Visit (HOSPITAL_COMMUNITY): Payer: Self-pay | Admitting: *Deleted

## 2020-09-15 DIAGNOSIS — C50912 Malignant neoplasm of unspecified site of left female breast: Secondary | ICD-10-CM

## 2020-09-15 NOTE — Progress Notes (Signed)
Mailed pt a letter to contact the office at her earliest convenience to receive lab result

## 2020-09-16 ENCOUNTER — Inpatient Hospital Stay (HOSPITAL_COMMUNITY): Payer: Medicare HMO | Admitting: Hematology and Oncology

## 2020-09-16 ENCOUNTER — Encounter (HOSPITAL_COMMUNITY): Payer: Self-pay | Admitting: Hematology and Oncology

## 2020-09-16 ENCOUNTER — Inpatient Hospital Stay (HOSPITAL_COMMUNITY): Payer: Medicare HMO

## 2020-09-16 ENCOUNTER — Inpatient Hospital Stay (HOSPITAL_COMMUNITY): Payer: Medicare HMO | Attending: Hematology

## 2020-09-16 ENCOUNTER — Other Ambulatory Visit: Payer: Self-pay

## 2020-09-16 VITALS — BP 139/72 | HR 81 | Temp 98.5°F | Resp 17 | Wt 176.2 lb

## 2020-09-16 DIAGNOSIS — Z8249 Family history of ischemic heart disease and other diseases of the circulatory system: Secondary | ICD-10-CM | POA: Insufficient documentation

## 2020-09-16 DIAGNOSIS — Z79811 Long term (current) use of aromatase inhibitors: Secondary | ICD-10-CM | POA: Insufficient documentation

## 2020-09-16 DIAGNOSIS — Z7982 Long term (current) use of aspirin: Secondary | ICD-10-CM | POA: Diagnosis not present

## 2020-09-16 DIAGNOSIS — C50912 Malignant neoplasm of unspecified site of left female breast: Secondary | ICD-10-CM

## 2020-09-16 DIAGNOSIS — Z7901 Long term (current) use of anticoagulants: Secondary | ICD-10-CM | POA: Diagnosis not present

## 2020-09-16 DIAGNOSIS — Z79899 Other long term (current) drug therapy: Secondary | ICD-10-CM | POA: Insufficient documentation

## 2020-09-16 DIAGNOSIS — M1990 Unspecified osteoarthritis, unspecified site: Secondary | ICD-10-CM | POA: Diagnosis not present

## 2020-09-16 DIAGNOSIS — M8580 Other specified disorders of bone density and structure, unspecified site: Secondary | ICD-10-CM | POA: Diagnosis not present

## 2020-09-16 DIAGNOSIS — Z170 Estrogen receptor positive status [ER+]: Secondary | ICD-10-CM | POA: Diagnosis not present

## 2020-09-16 DIAGNOSIS — Z86718 Personal history of other venous thrombosis and embolism: Secondary | ICD-10-CM | POA: Diagnosis not present

## 2020-09-16 DIAGNOSIS — Z86711 Personal history of pulmonary embolism: Secondary | ICD-10-CM | POA: Diagnosis not present

## 2020-09-16 DIAGNOSIS — Z8261 Family history of arthritis: Secondary | ICD-10-CM | POA: Insufficient documentation

## 2020-09-16 LAB — COMPREHENSIVE METABOLIC PANEL
ALT: 18 U/L (ref 0–44)
AST: 24 U/L (ref 15–41)
Albumin: 3.5 g/dL (ref 3.5–5.0)
Alkaline Phosphatase: 36 U/L — ABNORMAL LOW (ref 38–126)
Anion gap: 6 (ref 5–15)
BUN: 15 mg/dL (ref 8–23)
CO2: 26 mmol/L (ref 22–32)
Calcium: 9.2 mg/dL (ref 8.9–10.3)
Chloride: 105 mmol/L (ref 98–111)
Creatinine, Ser: 0.93 mg/dL (ref 0.44–1.00)
GFR, Estimated: 59 mL/min — ABNORMAL LOW (ref >60)
Glucose, Bld: 112 mg/dL — ABNORMAL HIGH (ref 70–99)
Potassium: 3.7 mmol/L (ref 3.5–5.1)
Sodium: 137 mmol/L (ref 135–145)
Total Bilirubin: 0.8 mg/dL (ref 0.3–1.2)
Total Protein: 6.8 g/dL (ref 6.5–8.1)

## 2020-09-16 LAB — CBC WITH DIFFERENTIAL/PLATELET
Abs Immature Granulocytes: 0.02 10*3/uL (ref 0.00–0.07)
Basophils Absolute: 0.1 10*3/uL (ref 0.0–0.1)
Basophils Relative: 1 %
Eosinophils Absolute: 0 10*3/uL (ref 0.0–0.5)
Eosinophils Relative: 1 %
HCT: 40.9 % (ref 36.0–46.0)
Hemoglobin: 12.7 g/dL (ref 12.0–15.0)
Immature Granulocytes: 1 %
Lymphocytes Relative: 35 %
Lymphs Abs: 1.4 10*3/uL (ref 0.7–4.0)
MCH: 28.6 pg (ref 26.0–34.0)
MCHC: 31.1 g/dL (ref 30.0–36.0)
MCV: 92.1 fL (ref 80.0–100.0)
Monocytes Absolute: 0.5 10*3/uL (ref 0.1–1.0)
Monocytes Relative: 12 %
Neutro Abs: 2.1 10*3/uL (ref 1.7–7.7)
Neutrophils Relative %: 50 %
Platelets: 175 10*3/uL (ref 150–400)
RBC: 4.44 MIL/uL (ref 3.87–5.11)
RDW: 15.3 % (ref 11.5–15.5)
WBC: 4.1 10*3/uL (ref 4.0–10.5)
nRBC: 0 % (ref 0.0–0.2)

## 2020-09-16 LAB — VITAMIN D 25 HYDROXY (VIT D DEFICIENCY, FRACTURES): Vit D, 25-Hydroxy: 57.04 ng/mL (ref 30–100)

## 2020-09-16 LAB — FOLATE: Folate: 19.1 ng/mL (ref >5.9)

## 2020-09-16 LAB — VITAMIN B12: Vitamin B-12: 637 pg/mL (ref 180–914)

## 2020-09-16 LAB — LACTATE DEHYDROGENASE: LDH: 173 U/L (ref 98–192)

## 2020-09-16 MED ORDER — DENOSUMAB 60 MG/ML ~~LOC~~ SOSY
60.0000 mg | PREFILLED_SYRINGE | Freq: Once | SUBCUTANEOUS | Status: AC
  Administered 2020-09-16: 60 mg via SUBCUTANEOUS

## 2020-09-16 MED ORDER — DENOSUMAB 60 MG/ML ~~LOC~~ SOSY
PREFILLED_SYRINGE | SUBCUTANEOUS | Status: AC
  Filled 2020-09-16: qty 1

## 2020-09-16 MED ORDER — DENOSUMAB 60 MG/ML ~~LOC~~ SOLN: 60.0000 mg | Freq: Once | SUBCUTANEOUS | Status: DC

## 2020-09-16 NOTE — Progress Notes (Signed)
[address], [place] [address]   CLINIC:  Medical Oncology/Hematology  PCP:  [name], MD [address] [place] [address] [phone]   REASON FOR VISIT: Follow-up for breast cancer   CURRENT THERAPY: Anastrozole  CANCER STAGING: Cancer Staging Infiltrating ductal carcinoma of breast ([place]) Staging form: Breast, AJCC 7th Edition - Clinical: Stage IIIA (T1c, N2a, cM0) - Signed by Baird Cancer, PA on 04/18/2011    INTERVAL HISTORY:   [name] 87 y.o. female returns for routine follow-up for breast cancer.  Patient reports she is doing well since her last visit.  She denies any new lumps or bumps present.  She denies any new bone pain. Minor aches and pains, she thinks this is not bothersome. No change in breathing, bowel or urinary habits. Denies any numbness or tingling in hands or feet. Denies any recent fevers, infections, or recent hospitalizations. Patient reports appetite at 100% and energy level at 50%.  She is eating well maintain her weight is time. Compliant with arimidex.  REVIEW OF SYSTEMS:  Review of Systems  Gastrointestinal: Positive for constipation. Negative for diarrhea and nausea.  Neurological: Negative for dizziness.  Psychiatric/Behavioral: Positive for sleep disturbance.  All other systems reviewed and are negative.    PAST MEDICAL/SURGICAL HISTORY:  Past Medical History:  Diagnosis Date  . Cancer ([place]) 2012   LEFT BREAST  . Clotting disorder ([facility]) 05/2011   dvt, right leg  . Degenerative disc disease    with nerve compression   . DJD (degenerative joint disease)   . Hypertension   . Hypertension   . Hypothyroidism   . Hypothyroidism   . Long term current use of anticoagulant therapy 12/07/2014   Pulmonary embolus   . Obesity   . Osteopenia 08/24/2014   Past Surgical History:  Procedure Laterality Date  . ABDOMINAL HYSTERECTOMY  1980   fibroids  . BREAST SURGERY   2012   left total mastectomy  . CHOLECYSTECTOMY  80'S   [facility]  . CHOLECYSTECTOMY    . HIP PINNING,CANNULATED Left 01/19/2013   Procedure: CANNULATED HIP PINNING-  left;  Surgeon: Marin Shutter, MD;  Location: [facility];  Service: Orthopedics;  Laterality: Left;  [name] MASTECTOMY MODIFIED RADICAL  02/14/11   left  . PORT-A-CATH REMOVAL N/A 12/05/2012   Procedure: MINOR REMOVAL PORT-A-CATH;  Surgeon: Jamesetta So, MD;  Location: [facility];  Service: General;  Laterality: N/A;  In Minor Room  . PORTACATH PLACEMENT  04/16/2011   Procedure: INSERTION PORT-A-CATH;  Surgeon: Jamesetta So;  Location: [facility];  Service: General;  Laterality: Right;  right subclavian  . VESICOVAGINAL FISTULA CLOSURE W/ TAH     [facility]     SOCIAL HISTORY:  Social History   Socioeconomic History  . Marital status: Widowed    Spouse name: Not on file  . Number of children: 2  . Years of education: 53  . Highest education level: 12th grade  Occupational History  . Occupation: [employer] part time   . Occupation: retired/disabled   Tobacco Use  . Smoking status: Never Smoker  . Smokeless tobacco: Never Used  Substance and Sexual Activity  . Alcohol use: No  . Drug use: No  . Sexual activity: Not Currently    Birth control/protection: Post-menopausal  Other Topics Concern  . Not on file  Social History Narrative   Reports starting to have a lot of trouble doing things for herself. Lives  alone, tries to drive to places, but it is getting increasingly harder to do.    Social Determinants of Health   Financial Resource Strain: Low Risk   . Difficulty of Paying Living Expenses: Not hard at all  Food Insecurity: No Food Insecurity  . Worried About Charity fundraiser in the Last Year: Never true  . Ran Out of Food in the Last Year: Never true  Transportation Needs: No Transportation Needs  . Lack of Transportation (Medical): No  . Lack of Transportation (Non-Medical): No  Physical Activity: Insufficiently Active  . Days of  Exercise per Week: 5 days  . Minutes of Exercise per Session: 10 min  Stress: No Stress Concern Present  . Feeling of Stress : Not at all  Social Connections: Moderately Isolated  . Frequency of Communication with Friends and Family: More than three times a week  . Frequency of Social Gatherings with Friends and Family: More than three times a week  . Attends Religious Services: More than 4 times per year  . Active Member of Clubs or Organizations: No  . Attends Archivist Meetings: Never  . Marital Status: Widowed  Intimate Partner Violence: Not At Risk  . Fear of Current or Ex-Partner: No  . Emotionally Abused: No  . Physically Abused: No  . Sexually Abused: No    FAMILY HISTORY:  Family History  Problem Relation Age of Onset  . COPD Father   . Lung disease Father   . Hypertension Sister   . Arthritis Mother   . Hypertension Sister   . Heart disease Son        stent  . Anesthesia problems Neg Hx   . Malignant hyperthermia Neg Hx   . Hypotension Neg Hx   . Pseudochol deficiency Neg Hx     CURRENT MEDICATIONS:  Outpatient Encounter Medications as of 09/16/2020  Medication Sig  . acetaminophen (TYLENOL) 650 MG CR tablet Take 650 mg by mouth 2 (two) times daily.  [name] anastrozole (ARIMIDEX) 1 MG tablet TAKE 1 TABLET AT BEDTIME  . Biotin w/ Vitamins C & E (HAIR/SKIN/NAILS PO) Take by mouth.  . calcium-vitamin D (OSCAL WITH D) 500-200 MG-UNIT tablet Take 1 tablet by mouth 2 (two) times daily.  [name] Cod Liver Oil 1000 MG CAPS Take 1 capsule by mouth daily.   [name] denosumab (PROLIA) 60 MG/ML SOLN injection Inject 60 mg into the skin every 6 (six) months. Administer in upper arm, thigh, or abdomen  . docusate sodium (COLACE) 100 MG capsule Take 100 mg by mouth daily as needed for constipation. For constipation  . hydrochlorothiazide (MICROZIDE) 12.5 MG capsule TAKE 1 CAPSULE EVERY MORNING  . levothyroxine (SYNTHROID) 100 MCG tablet TAKE 1 TABLET (100 MCG TOTAL) BY MOUTH EVERY  MORNING.  [name] lovastatin (MEVACOR) 20 MG tablet TAKE 1 TABLET (20 MG TOTAL) BY MOUTH AT BEDTIME.  [name] potassium chloride (KLOR-CON) 10 MEQ tablet TAKE 1 TABLET (10 MEQ TOTAL) BY MOUTH 2 (TWO) TIMES DAILY.  [name] potassium chloride (KLOR-CON) 10 MEQ tablet TAKE 1 TABLET TWICE DAILY  . pyridOXINE (VITAMIN B-6) 100 MG tablet Take 100 mg by mouth daily.  . vitamin E 1000 UNIT capsule Take 1,000 Units by mouth daily.  Alveda Reasons 20 MG TABS tablet TAKE 1 TABLET EVERY DAY WITH SUPPER.   Facility-Administered Encounter Medications as of 09/16/2020  Medication  . heparin lock flush 100 unit/mL  . heparin lock flush 100 unit/mL  . sodium chloride 0.9 % injection 10 mL  .  sodium chloride 0.9 % injection 10 mL    ALLERGIES:  Allergies  Allergen Reactions  . Listerine [Antiseptic Mouth Rinse]     Caused sore throat  . Singulair [Montelukast Sodium] Other (See Comments)    Abdominal spasm      PHYSICAL EXAM:  ECOG Performance status: 1  Vitals:   09/16/20 1207  BP: 139/72  Pulse: 81  Resp: 17  Temp: 98.5 F (36.9 C)  SpO2: 99%   Filed Weights   09/16/20 1207  Weight: 176 lb 4 oz (79.9 kg)   Physical Exam Constitutional:      Appearance: Normal appearance. She is normal weight.  Cardiovascular:     Rate and Rhythm: Normal rate and regular rhythm.     Heart sounds: Normal heart sounds.  Pulmonary:     Effort: Pulmonary effort is normal.     Breath sounds: Normal breath sounds.  Abdominal:     General: Bowel sounds are normal.     Palpations: Abdomen is soft.  Musculoskeletal:        General: Normal range of motion.  Skin:    General: Skin is warm.  Neurological:     Mental Status: She is alert and oriented to person, place, and time. Mental status is at baseline.  Psychiatric:        Mood and Affect: Mood normal.        Behavior: Behavior normal.        Thought Content: Thought content normal.        Judgment: Judgment normal.   Normal breast exam. Left mastectomy site with no  evidence of recurrence. No regional adenopathy Right breast normal to inspection and palpation.   LABORATORY DATA:  I have reviewed the labs as listed.  CBC    Component Value Date/Time   WBC 4.1 09/16/2020 1049   RBC 4.44 09/16/2020 1049   HGB 12.7 09/16/2020 1049   HCT 40.9 09/16/2020 1049   PLT 175 09/16/2020 1049   MCV 92.1 09/16/2020 1049   MCH 28.6 09/16/2020 1049   MCHC 31.1 09/16/2020 1049   RDW 15.3 09/16/2020 1049   LYMPHSABS 1.4 09/16/2020 1049   MONOABS 0.5 09/16/2020 1049   EOSABS 0.0 09/16/2020 1049   BASOSABS 0.1 09/16/2020 1049   CMP Latest Ref Rng & Units 09/16/2020 08/24/2020 03/08/2020  Glucose 70 - 99 mg/dL 112(H) 99 99  BUN 8 - 23 mg/dL '15 13 11  ' Creatinine 0.44 - 1.00 mg/dL 0.93 0.85 0.95  Sodium 135 - 145 mmol/L 137 142 139  Potassium 3.5 - 5.1 mmol/L 3.7 3.8 4.0  Chloride 98 - 111 mmol/L 105 105 105  CO2 22 - 32 mmol/L '26 22 26  ' Calcium 8.9 - 10.3 mg/dL 9.2 9.6 9.2  Total Protein 6.5 - 8.1 g/dL 6.8 6.8 6.9  Total Bilirubin 0.3 - 1.2 mg/dL 0.8 0.5 0.8  Alkaline Phos 38 - 126 U/L 36(L) 45 35(L)  AST 15 - 41 U/L '24 22 22  ' ALT 0 - 44 U/L '18 15 19    ' All questions were answered to patient's stated satisfaction. Encouraged patient to call with any new concerns or questions before his next visit to the cancer center and we can certain see him sooner, if needed.     ASSESSMENT & PLAN:  No problem-specific Assessment & Plan notes found for this encounter.  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  Status post 6 cycles of epirubicin and Cytoxan, followed by radiation therapy. -Anastrozole started on 11/19/2011.  She will complete for 10 years. -Today's physical examination not show any suspicious masses at the left mastectomy site.  Right breast has no  palpable masses.  No adenopathy. -Last mammogram on 08/15/20, BIRADS category 1 negative. -Labs done on 03/08/2020 were all WNL PE normal, no concern for recurrence. -She will follow up in 6 months with repeat labs   2.  Osteopenia: -Initial DEXA scan on 07/12/2016 showed T score of -1.4. -She was started on Prolia on 09/01/2014 which she is tolerating well. -Most recent dexa scan with T score of -1.3. Ok to continue prolia every 6 months.  3.  Thromboembolism: -She had a right leg DVT in 2012. -Bilateral pulmonary embolism was diagnosed in 2014. -She is currently on Xarelto indefinitely.   Orders placed this encounter:  No orders of the defined types were placed in this encounter.  Benay Pike MD  I spent 30 minutes in the care of this patient including H and P, review of records, counseling and coordination of care.

## 2020-09-16 NOTE — Progress Notes (Signed)
[name] presents today for injection per the provider's orders.  Prolia 60 mg administration without incident; injection site WNL; see MAR for injection details.  Patient tolerated procedure well and without incident.  No questions or complaints noted at this time. Patient discharged ambulatory in wheelchair in stable condition.

## 2020-10-03 ENCOUNTER — Other Ambulatory Visit: Payer: Self-pay | Admitting: Family Medicine

## 2020-10-12 ENCOUNTER — Other Ambulatory Visit: Payer: Self-pay | Admitting: Family Medicine

## 2020-10-27 ENCOUNTER — Encounter (HOSPITAL_COMMUNITY): Payer: Self-pay

## 2020-10-27 ENCOUNTER — Other Ambulatory Visit: Payer: Self-pay

## 2020-10-27 ENCOUNTER — Emergency Department (HOSPITAL_COMMUNITY)
Admission: EM | Admit: 2020-10-27 | Discharge: 2020-10-27 | Disposition: A | Discharge status: Home / Self Care | Payer: Medicare HMO | Attending: Emergency Medicine | Admitting: Emergency Medicine

## 2020-10-27 DIAGNOSIS — Z79899 Other long term (current) drug therapy: Secondary | ICD-10-CM | POA: Diagnosis not present

## 2020-10-27 DIAGNOSIS — K0511 Chronic gingivitis, non-plaque induced: Secondary | ICD-10-CM | POA: Diagnosis not present

## 2020-10-27 DIAGNOSIS — E039 Hypothyroidism, unspecified: Secondary | ICD-10-CM | POA: Diagnosis not present

## 2020-10-27 DIAGNOSIS — I10 Essential (primary) hypertension: Secondary | ICD-10-CM | POA: Insufficient documentation

## 2020-10-27 DIAGNOSIS — Z853 Personal history of malignant neoplasm of breast: Secondary | ICD-10-CM | POA: Diagnosis not present

## 2020-10-27 DIAGNOSIS — R041 Hemorrhage from throat: Secondary | ICD-10-CM | POA: Diagnosis present

## 2020-10-27 DIAGNOSIS — Z7901 Long term (current) use of anticoagulants: Secondary | ICD-10-CM | POA: Diagnosis not present

## 2020-10-27 DIAGNOSIS — K0510 Chronic gingivitis, plaque induced: Secondary | ICD-10-CM | POA: Insufficient documentation

## 2020-10-27 MED ORDER — TRANEXAMIC ACID FOR EPISTAXIS
500.0000 mg | Freq: Once | TOPICAL | Status: AC
  Administered 2020-10-27: 500 mg via TOPICAL
  Filled 2020-10-27: qty 10

## 2020-10-27 MED ORDER — BUPIVACAINE-EPINEPHRINE (PF) 0.5% -1:200000 IJ SOLN
1.8000 mL | Freq: Once | INTRAMUSCULAR | Status: AC
  Administered 2020-10-27: 1.8 mL
  Filled 2020-10-27: qty 1.8

## 2020-10-27 MED ORDER — CHLORHEXIDINE GLUCONATE 0.12 % MT SOLN: 15.0000 mL | Freq: Two times a day (BID) | OROMUCOSAL | 3 refills | Status: DC

## 2020-10-27 NOTE — ED Triage Notes (Signed)
Pt here from home with cc of oral bleeding. Said that from time to time a place in the upper right Part of her  Mouth will bleed. Said tonight she cant get it to stop. Has not been to the dentist in many years. No pain. On xarelto

## 2020-10-27 NOTE — ED Provider Notes (Signed)
[facility] EMERGENCY DEPARTMENT Provider Note   CSN: [id number] Arrival date & time: 10/27/20  0402     History Chief Complaint  Patient presents with  . Coagulation Disorder    Lisa Klein is a 87 y.o. female.  Patient presents to the emergency department for evaluation of bleeding from her gums.  Patient reports bleeding from the right upper gums.  She reports that she has a history of gingivitis and is on Xarelto because of a history of blood clots.  She reports that she bleeds from this area once in a while.  Usually she can put a little pressure on the area and the bleeding stops.  Tonight this did not work.  She thinks she might of gotten some food stuck between her teeth and did pick the food out earlier before the bleeding started.  No dental pain, facial swelling.        Past Medical History:  Diagnosis Date  . Cancer ([place]) 2012   LEFT BREAST  . Clotting disorder ([place]) 05/2011   dvt, right leg  . Degenerative disc disease    with nerve compression   . DJD (degenerative joint disease)   . Hypertension   . Hypertension   . Hypothyroidism   . Hypothyroidism   . Long term current use of anticoagulant therapy 12/07/2014   Pulmonary embolus   . Obesity   . Osteopenia 08/24/2014    Patient Active Problem List   Diagnosis Date Noted  . Knee pain, left 02/15/2020  . Generalized osteoarthritis of multiple sites 04/02/2018  . Allergic rhinitis 09/01/2015  . Allergic cough 09/01/2015  . Obesity 12/13/2014  . Long term current use of anticoagulant therapy 12/07/2014  . Osteopenia 08/24/2014  . Infiltrating ductal carcinoma of breast ([place]) 04/10/2011  . Hyperlipemia 11/07/2007  . Hypothyroidism 08/15/2007  . Essential hypertension 08/15/2007    Past Surgical History:  Procedure Laterality Date  . ABDOMINAL HYSTERECTOMY  1980   fibroids  . BREAST SURGERY  2012   left total mastectomy  . CHOLECYSTECTOMY  80'S   [facility]  . CHOLECYSTECTOMY    . HIP  PINNING,CANNULATED Left 01/19/2013   Procedure: CANNULATED HIP PINNING-  left;  Surgeon: Marin Shutter, MD;  Location: [facility];  Service: Orthopedics;  Laterality: Left;  [name] MASTECTOMY MODIFIED RADICAL  02/14/11   left  . PORT-A-CATH REMOVAL N/A 12/05/2012   Procedure: MINOR REMOVAL PORT-A-CATH;  Surgeon: Jamesetta So, MD;  Location: [facility];  Service: General;  Laterality: N/A;  In Minor Room  . PORTACATH PLACEMENT  04/16/2011   Procedure: INSERTION PORT-A-CATH;  Surgeon: Jamesetta So;  Location: [facility];  Service: General;  Laterality: Right;  right subclavian  . VESICOVAGINAL FISTULA CLOSURE W/ TAH     [facility]     OB History   No obstetric history on file.     Family History  Problem Relation Age of Onset  . COPD Father   . Lung disease Father   . Hypertension Sister   . Arthritis Mother   . Hypertension Sister   . Heart disease Son        stent  . Anesthesia problems Neg Hx   . Malignant hyperthermia Neg Hx   . Hypotension Neg Hx   . Pseudochol deficiency Neg Hx     Social History   Tobacco Use  . Smoking status: Never Smoker  . Smokeless tobacco: Never Used  Substance Use Topics  . Alcohol use: No  . Drug use:  No    Home Medications Prior to Admission medications   Medication Sig Start Date End Date Taking? Authorizing Provider  chlorhexidine (PERIDEX) 0.12 % solution Use as directed 15 mLs in the mouth or throat 2 (two) times daily. 10/27/20  Yes Elma Shands, Gwenyth Allegra, MD  acetaminophen (TYLENOL) 650 MG CR tablet Take 650 mg by mouth 2 (two) times daily.    [provider]  anastrozole (ARIMIDEX) 1 MG tablet TAKE 1 TABLET AT BEDTIME 03/17/20   Derek Jack, MD  Biotin w/ Vitamins C & E (HAIR/SKIN/NAILS PO) Take by mouth.    [provider]  calcium-vitamin D (OSCAL WITH D) 500-200 MG-UNIT tablet Take 1 tablet by mouth 2 (two) times daily.    [provider]  Cod Liver Oil 1000 MG CAPS Take 1 capsule by mouth daily.     [provider]  denosumab (PROLIA) 60 MG/ML SOLN injection Inject 60 mg into the skin every 6 (six) months. Administer in upper arm, thigh, or abdomen    [provider]  docusate sodium (COLACE) 100 MG capsule Take 100 mg by mouth daily as needed for constipation. For constipation    [provider]  hydrochlorothiazide (MICROZIDE) 12.5 MG capsule TAKE 1 CAPSULE EVERY MORNING 07/07/20   [name], MD  levothyroxine (SYNTHROID) 100 MCG tablet TAKE 1 TABLET (100 MCG TOTAL) BY MOUTH EVERY MORNING. 07/18/20   [name], MD  lovastatin (MEVACOR) 20 MG tablet TAKE 1 TABLET (20 MG TOTAL) BY MOUTH AT BEDTIME. 10/12/20   [name], MD  potassium chloride (KLOR-CON) 10 MEQ tablet TAKE 1 TABLET (10 MEQ TOTAL) BY MOUTH 2 (TWO) TIMES DAILY. 10/22/19   [name], MD  potassium chloride (KLOR-CON) 10 MEQ tablet TAKE 1 TABLET TWICE DAILY 10/04/20   [name], MD  pyridOXINE (VITAMIN B-6) 100 MG tablet Take 100 mg by mouth daily.    [provider]  vitamin E 1000 UNIT capsule Take 1,000 Units by mouth daily.    [provider]  XARELTO 20 MG TABS tablet TAKE 1 TABLET EVERY DAY WITH SUPPER. 09/01/20   Derek Jack, MD    Allergies    Listerine [antiseptic mouth rinse] and Singulair [montelukast sodium]  Review of Systems   Review of Systems  HENT: Positive for dental problem.   All other systems reviewed and are negative.   Physical Exam Updated Vital Signs BP (!) 143/76 (BP Location: Right Arm)   Pulse 84   Temp 98.1 F (36.7 C) (Oral)   Resp 18   Ht 5\' 5"  (1.651 m)   Wt 84.8 kg   SpO2 100%   BMI 31.12 kg/m   Physical Exam Vitals and nursing note reviewed.  Constitutional:      General: She is not in acute distress.    Appearance: Normal appearance. She is well-developed.  HENT:     Head: Normocephalic and atraumatic.     Right Ear: Hearing normal.     Left Ear: Hearing normal.     Nose: Nose normal.      Mouth/Throat:     Comments: Slight oozing of blood from right upper molar area Eyes:     Conjunctiva/sclera: Conjunctivae normal.     Pupils: Pupils are equal, round, and reactive to light.  Cardiovascular:     Rate and Rhythm: Regular rhythm.     Heart sounds: S1 normal and S2 normal. No murmur heard. No friction rub. No gallop.   Pulmonary:  Effort: Pulmonary effort is normal. No respiratory distress.     Breath sounds: Normal breath sounds.  Chest:     Chest wall: No tenderness.  Abdominal:     General: Bowel sounds are normal.     Palpations: Abdomen is soft.     Tenderness: There is no abdominal tenderness. There is no guarding or rebound. Negative signs include Murphy's sign and McBurney's sign.     Hernia: No hernia is present.  Musculoskeletal:        General: Normal range of motion.     Cervical back: Normal range of motion and neck supple.  Skin:    General: Skin is warm and dry.     Findings: No rash.  Neurological:     Mental Status: She is alert and oriented to person, place, and time.     GCS: GCS eye subscore is 4. GCS verbal subscore is 5. GCS motor subscore is 6.     Cranial Nerves: No cranial nerve deficit.     Sensory: No sensory deficit.     Coordination: Coordination normal.  Psychiatric:        Speech: Speech normal.        Behavior: Behavior normal.        Thought Content: Thought content normal.     ED Results / Procedures / Treatments   Labs (all labs ordered are listed, but only abnormal results are displayed) Labs Reviewed - No data to display  EKG None  Radiology No results found.  Procedures Procedures   Medications Ordered in ED Medications  tranexamic acid (CYKLOKAPRON) 1000 MG/10ML topical solution 500 mg (500 mg Topical Given 10/27/20 0554)    ED Course  I have reviewed the triage vital signs and the nursing notes.  Pertinent labs & imaging results that were available during my care of the patient were reviewed by me  and considered in my medical decision making (see chart for details).    MDM Rules/Calculators/A&P                          Patient presents to the emergency department for evaluation of bleeding from her gums.  Patient reports a history of gingivitis and intermittent bleeding from her gums secondary to her Xarelto use.  Patient has very slight oozing of blood at arrival.  No significant amount of blood loss noted.  Treated topically with tranexamic acid with improvement.  Will discharge, Final Clinical Impression(s) / ED Diagnoses Final diagnoses:  Gingivitis    Rx / DC Orders ED Discharge Orders         Ordered    chlorhexidine (PERIDEX) 0.12 % solution  2 times daily        10/27/20 0648           Orpah Greek, MD 10/27/20 [phone]

## 2020-12-06 ENCOUNTER — Encounter: Payer: Self-pay | Admitting: Nurse Practitioner

## 2020-12-13 ENCOUNTER — Other Ambulatory Visit: Payer: Self-pay | Admitting: Family Medicine

## 2020-12-23 DIAGNOSIS — H25013 Cortical age-related cataract, bilateral: Secondary | ICD-10-CM | POA: Diagnosis not present

## 2020-12-23 DIAGNOSIS — H401232 Low-tension glaucoma, bilateral, moderate stage: Secondary | ICD-10-CM | POA: Diagnosis not present

## 2020-12-23 DIAGNOSIS — H524 Presbyopia: Secondary | ICD-10-CM | POA: Diagnosis not present

## 2020-12-23 DIAGNOSIS — H2513 Age-related nuclear cataract, bilateral: Secondary | ICD-10-CM | POA: Diagnosis not present

## 2020-12-31 ENCOUNTER — Other Ambulatory Visit (HOSPITAL_COMMUNITY): Payer: Self-pay | Admitting: Hematology

## 2020-12-31 DIAGNOSIS — C50912 Malignant neoplasm of unspecified site of left female breast: Secondary | ICD-10-CM

## 2021-01-03 ENCOUNTER — Encounter (HOSPITAL_COMMUNITY): Payer: Self-pay | Admitting: Hematology

## 2021-01-31 ENCOUNTER — Other Ambulatory Visit: Payer: Self-pay | Admitting: Family Medicine

## 2021-03-01 ENCOUNTER — Other Ambulatory Visit: Payer: Self-pay | Admitting: Family Medicine

## 2021-03-17 ENCOUNTER — Other Ambulatory Visit (HOSPITAL_COMMUNITY): Payer: Self-pay

## 2021-03-17 DIAGNOSIS — C50912 Malignant neoplasm of unspecified site of left female breast: Secondary | ICD-10-CM

## 2021-03-20 ENCOUNTER — Inpatient Hospital Stay (HOSPITAL_COMMUNITY): Payer: Medicare HMO | Attending: Hematology

## 2021-03-20 ENCOUNTER — Other Ambulatory Visit: Payer: Self-pay

## 2021-03-20 ENCOUNTER — Inpatient Hospital Stay (HOSPITAL_BASED_OUTPATIENT_CLINIC_OR_DEPARTMENT_OTHER): Payer: Medicare HMO | Admitting: Nurse Practitioner

## 2021-03-20 ENCOUNTER — Ambulatory Visit (HOSPITAL_COMMUNITY): Payer: Medicare HMO | Admitting: Hematology

## 2021-03-20 ENCOUNTER — Inpatient Hospital Stay (HOSPITAL_COMMUNITY): Payer: Medicare HMO

## 2021-03-20 ENCOUNTER — Ambulatory Visit (HOSPITAL_COMMUNITY): Payer: Medicare HMO

## 2021-03-20 VITALS — BP 142/60 | HR 68 | Temp 96.8°F | Resp 16 | Wt 180.7 lb

## 2021-03-20 DIAGNOSIS — M1990 Unspecified osteoarthritis, unspecified site: Secondary | ICD-10-CM | POA: Diagnosis not present

## 2021-03-20 DIAGNOSIS — Z79811 Long term (current) use of aromatase inhibitors: Secondary | ICD-10-CM | POA: Insufficient documentation

## 2021-03-20 DIAGNOSIS — Z8249 Family history of ischemic heart disease and other diseases of the circulatory system: Secondary | ICD-10-CM | POA: Diagnosis not present

## 2021-03-20 DIAGNOSIS — Z7982 Long term (current) use of aspirin: Secondary | ICD-10-CM | POA: Insufficient documentation

## 2021-03-20 DIAGNOSIS — Z79899 Other long term (current) drug therapy: Secondary | ICD-10-CM | POA: Diagnosis not present

## 2021-03-20 DIAGNOSIS — Z7901 Long term (current) use of anticoagulants: Secondary | ICD-10-CM | POA: Diagnosis not present

## 2021-03-20 DIAGNOSIS — R5383 Other fatigue: Secondary | ICD-10-CM | POA: Diagnosis not present

## 2021-03-20 DIAGNOSIS — Z170 Estrogen receptor positive status [ER+]: Secondary | ICD-10-CM | POA: Diagnosis not present

## 2021-03-20 DIAGNOSIS — C50912 Malignant neoplasm of unspecified site of left female breast: Secondary | ICD-10-CM

## 2021-03-20 DIAGNOSIS — Z86718 Personal history of other venous thrombosis and embolism: Secondary | ICD-10-CM

## 2021-03-20 DIAGNOSIS — Z86711 Personal history of pulmonary embolism: Secondary | ICD-10-CM | POA: Insufficient documentation

## 2021-03-20 DIAGNOSIS — M8580 Other specified disorders of bone density and structure, unspecified site: Secondary | ICD-10-CM | POA: Diagnosis not present

## 2021-03-20 DIAGNOSIS — Z8261 Family history of arthritis: Secondary | ICD-10-CM | POA: Insufficient documentation

## 2021-03-20 DIAGNOSIS — Z1231 Encounter for screening mammogram for malignant neoplasm of breast: Secondary | ICD-10-CM

## 2021-03-20 LAB — CBC WITH DIFFERENTIAL/PLATELET
Abs Immature Granulocytes: 0 10*3/uL (ref 0.00–0.07)
Basophils Absolute: 0.1 10*3/uL (ref 0.0–0.1)
Basophils Relative: 2 %
Eosinophils Absolute: 0.1 10*3/uL (ref 0.0–0.5)
Eosinophils Relative: 2 %
HCT: 40 % (ref 36.0–46.0)
Hemoglobin: 12.8 g/dL (ref 12.0–15.0)
Immature Granulocytes: 0 %
Lymphocytes Relative: 36 %
Lymphs Abs: 1.4 10*3/uL (ref 0.7–4.0)
MCH: 29.8 pg (ref 26.0–34.0)
MCHC: 32 g/dL (ref 30.0–36.0)
MCV: 93.2 fL (ref 80.0–100.0)
Monocytes Absolute: 0.5 10*3/uL (ref 0.1–1.0)
Monocytes Relative: 13 %
Neutro Abs: 1.9 10*3/uL (ref 1.7–7.7)
Neutrophils Relative %: 47 %
Platelets: 169 10*3/uL (ref 150–400)
RBC: 4.29 MIL/uL (ref 3.87–5.11)
RDW: 14.7 % (ref 11.5–15.5)
WBC: 3.9 10*3/uL — ABNORMAL LOW (ref 4.0–10.5)
nRBC: 0 % (ref 0.0–0.2)

## 2021-03-20 LAB — COMPREHENSIVE METABOLIC PANEL
ALT: 16 U/L (ref 0–44)
AST: 23 U/L (ref 15–41)
Albumin: 3.7 g/dL (ref 3.5–5.0)
Alkaline Phosphatase: 32 U/L — ABNORMAL LOW (ref 38–126)
Anion gap: 6 (ref 5–15)
BUN: 14 mg/dL (ref 8–23)
CO2: 27 mmol/L (ref 22–32)
Calcium: 9.2 mg/dL (ref 8.9–10.3)
Chloride: 103 mmol/L (ref 98–111)
Creatinine, Ser: 0.92 mg/dL (ref 0.44–1.00)
GFR, Estimated: 60 mL/min — ABNORMAL LOW (ref >60)
Glucose, Bld: 109 mg/dL — ABNORMAL HIGH (ref 70–99)
Potassium: 3.7 mmol/L (ref 3.5–5.1)
Sodium: 136 mmol/L (ref 135–145)
Total Bilirubin: 0.8 mg/dL (ref 0.3–1.2)
Total Protein: 6.9 g/dL (ref 6.5–8.1)

## 2021-03-20 LAB — VITAMIN B12: Vitamin B-12: 667 pg/mL (ref 180–914)

## 2021-03-20 LAB — FOLATE: Folate: 20.2 ng/mL (ref >5.9)

## 2021-03-20 LAB — LACTATE DEHYDROGENASE: LDH: 164 U/L (ref 98–192)

## 2021-03-20 LAB — VITAMIN D 25 HYDROXY (VIT D DEFICIENCY, FRACTURES): Vit D, 25-Hydroxy: 68.29 ng/mL (ref 30–100)

## 2021-03-20 MED ORDER — DENOSUMAB 60 MG/ML ~~LOC~~ SOSY
60.0000 mg | PREFILLED_SYRINGE | Freq: Once | SUBCUTANEOUS | Status: AC
  Administered 2021-03-20: 60 mg via SUBCUTANEOUS
  Filled 2021-03-20 (×2): qty 1

## 2021-03-20 NOTE — Patient Instructions (Signed)
Lisa Klein  Discharge Instructions: Thank you for choosing [facility] to provide your oncology and hematology care.  If you have a lab appointment with the [facility], please come in thru the Main Entrance and check in at the main information desk.  Wear comfortable clothing and clothing appropriate for easy access to any Portacath or PICC line.   We strive to give you quality time with your provider. You may need to reschedule your appointment if you arrive late (15 or more minutes).  Arriving late affects you and other patients whose appointments are after yours.  Also, if you miss three or more appointments without notifying the office, you may be dismissed from the clinic at the provider's discretion.      For prescription refill requests, have your pharmacy contact our office and allow 72 hours for refills to be completed.    Today you received the following chemotherapy and/or immunotherapy agents prolia injection.       To help prevent nausea and vomiting after your treatment, we encourage you to take your nausea medication as directed.  BELOW ARE SYMPTOMS THAT SHOULD BE REPORTED IMMEDIATELY: *FEVER GREATER THAN 100.4 F (38 C) OR HIGHER *CHILLS OR SWEATING *NAUSEA AND VOMITING THAT IS NOT CONTROLLED WITH YOUR NAUSEA MEDICATION *UNUSUAL SHORTNESS OF BREATH *UNUSUAL BRUISING OR BLEEDING *URINARY PROBLEMS (pain or burning when urinating, or frequent urination) *BOWEL PROBLEMS (unusual diarrhea, constipation, pain near the anus) TENDERNESS IN MOUTH AND THROAT WITH OR WITHOUT PRESENCE OF ULCERS (sore throat, sores in mouth, or a toothache) UNUSUAL RASH, SWELLING OR PAIN  UNUSUAL VAGINAL DISCHARGE OR ITCHING   Items with * indicate a potential emergency and should be followed up as soon as possible or go to the Emergency Department if any problems should occur.  Please show the CHEMOTHERAPY ALERT CARD or IMMUNOTHERAPY ALERT CARD at check-in to the  Emergency Department and triage nurse.  Should you have questions after your visit or need to cancel or reschedule your appointment, please contact [facility] [phone]  and follow the prompts.  Office hours are 8:00 a.m. to 4:30 p.m. Monday - Friday. Please note that voicemails left after 4:00 p.m. may not be returned until the following business day.  We are closed weekends and major holidays. You have access to a nurse at all times for urgent questions. Please call the main number to the clinic [phone] and follow the prompts.  For any non-urgent questions, you may also contact your provider using MyChart. We now offer e-Visits for anyone 6 and older to request care online for non-urgent symptoms. For details visit [email or web address].   Also download the MyChart app! Go to the app store, search "MyChart", open the app, select [facility], and log in with your MyChart username and password.  Due to Covid, a mask is required upon entering the hospital/clinic. If you do not have a mask, one will be given to you upon arrival. For doctor visits, patients may have 1 support person aged 13 or older with them. For treatment visits, patients cannot have anyone with them due to current Covid guidelines and our immunocompromised population.

## 2021-03-20 NOTE — Progress Notes (Signed)
[name], [name] [address]   Virtual Visit Progress Note  I connected with Lisa Klein on 03/20/21 at  1:40 PM EDT by video enabled telemedicine visit and verified that I am speaking with the correct person using two identifiers.   I discussed the limitations, risks, security and privacy concerns of performing an evaluation and management service by telemedicine and the availability of in-person appointments. I also discussed with the patient that there may be a patient responsible charge related to this service. The patient expressed understanding and agreed to proceed.   Other persons participating in the visit and their role in the encounter: RN, NP, Patient   Patient's location: [facility]   Provider's location: [facility]   CLINIC:  Medical Oncology/Hematology  PCP:  [name], MD [address] [place] [address] [phone]  REASON FOR VISIT: Follow-up for breast cancer  CURRENT THERAPY: Anastrozole  CANCER STAGING: Cancer Staging Infiltrating ductal carcinoma of breast ([facility]) Staging form: Breast, AJCC 7th Edition - Clinical: Stage IIIA (T1c, N2a, cM0) - Signed by [facility] Cancer, PA on 04/18/2011   INTERVAL HISTORY: [name] 87 y.o. female returns for routine follow-up for breast cancer and consideration of prolia. Says she has progressive fatigue over the past year. Says she rests and symptoms improve. Feels tired particularly after she eats. No new lumps or bumps. No new bone pain. No neurologic complaints. Appetite is stable. No chest pain or shortness of breath. No recent fevers, chills, or infections. She continued arimidex.   REVIEW OF SYSTEMS:  Review of Systems  Constitutional:  Positive for fatigue. Negative for appetite change and unexpected weight change.  HENT:   Negative for mouth sores, sore throat and trouble swallowing.   Respiratory:  Negative for chest tightness and shortness of  breath.   Cardiovascular:  Negative for leg swelling.  Gastrointestinal:  Negative for abdominal pain, constipation, diarrhea, nausea and vomiting.  Genitourinary:  Negative for bladder incontinence and dysuria.   Musculoskeletal:  Negative for flank pain and neck stiffness.  Skin:  Negative for itching, rash and wound.  Neurological:  Negative for dizziness, headaches, light-headedness and numbness.  Psychiatric/Behavioral:  Negative for confusion, depression and sleep disturbance. The patient is not nervous/anxious.     PAST MEDICAL/SURGICAL HISTORY:  Past Medical History:  Diagnosis Date   Cancer ([facility]) 2012   LEFT BREAST   Clotting disorder ([facility]) 05/2011   dvt, right leg   Degenerative disc disease    with nerve compression    DJD (degenerative joint disease)    Hypertension    Hypertension    Hypothyroidism    Hypothyroidism    Long term current use of anticoagulant therapy 12/07/2014   Pulmonary embolus    Obesity    Osteopenia 08/24/2014   Past Surgical History:  Procedure Laterality Date   ABDOMINAL HYSTERECTOMY  1980   fibroids   BREAST SURGERY  2012   left total mastectomy   CHOLECYSTECTOMY  80'S   [facility]   CHOLECYSTECTOMY     HIP PINNING,CANNULATED Left 01/19/2013   Procedure: CANNULATED HIP PINNING-  left;  Surgeon: Marin Shutter, MD;  Location: [facility];  Service: Orthopedics;  Laterality: Left;   MASTECTOMY MODIFIED RADICAL  02/14/11   left   PORT-A-CATH REMOVAL N/A 12/05/2012   Procedure: MINOR REMOVAL PORT-A-CATH;  Surgeon: Jamesetta So, MD;  Location: [facility];  Service: General;  Laterality: N/A;  In Minor Room  PORTACATH PLACEMENT  04/16/2011   Procedure: INSERTION PORT-A-CATH;  Surgeon: Jamesetta So;  Location: [facility];  Service: General;  Laterality: Right;  right subclavian   VESICOVAGINAL FISTULA CLOSURE W/ TAH     [facility]     SOCIAL HISTORY:  Social History   Socioeconomic History   Marital status: Widowed    Spouse name: Not on file   Number of  children: 2   Years of education: 55   Highest education level: 12th grade  Occupational History   Occupation: [employer] part time    Occupation: retired/disabled   Tobacco Use   Smoking status: Never   Smokeless tobacco: Never  Substance and Sexual Activity   Alcohol use: No   Drug use: No   Sexual activity: Not Currently    Birth control/protection: Post-menopausal  Other Topics Concern   Not on file  Social History Narrative   Reports starting to have a lot of trouble doing things for herself. Lives alone, tries to drive to places, but it is getting increasingly harder to do.    Social Determinants of Health   Financial Resource Strain: Low Risk    Difficulty of Paying Living Expenses: Not hard at all  Food Insecurity: No Food Insecurity   Worried About Charity fundraiser in the Last Year: Never true   [place] in the Last Year: Never true  Transportation Needs: No Transportation Needs   Lack of Transportation (Medical): No   Lack of Transportation (Non-Medical): No  Physical Activity: Insufficiently Active   Days of Exercise per Week: 5 days   Minutes of Exercise per Session: 10 min  Stress: No Stress Concern Present   Feeling of Stress : Not at all  Social Connections: Moderately Isolated   Frequency of Communication with Friends and Family: More than three times a week   Frequency of Social Gatherings with Friends and Family: More than three times a week   Attends Religious Services: More than 4 times per year   Active Member of [employer] or Organizations: No   Attends Archivist Meetings: Never   Marital Status: Widowed  Human resources officer Violence: Not At Risk   Fear of Current or Ex-Partner: No   Emotionally Abused: No   Physically Abused: No   Sexually Abused: No    FAMILY HISTORY:  Family History  Problem Relation Age of Onset   COPD Father    Lung disease Father    Hypertension Sister    Arthritis Mother    Hypertension Sister    Heart disease  Son        stent   Anesthesia problems Neg Hx    Malignant hyperthermia Neg Hx    Hypotension Neg Hx    Pseudochol deficiency Neg Hx     CURRENT MEDICATIONS:  Outpatient Encounter Medications as of 03/20/2021  Medication Sig   acetaminophen (TYLENOL) 650 MG CR tablet Take 650 mg by mouth 2 (two) times daily.   anastrozole (ARIMIDEX) 1 MG tablet TAKE 1 TABLET AT BEDTIME   Biotin w/ Vitamins C & E (HAIR/SKIN/NAILS PO) Take by mouth.   calcium-vitamin D (OSCAL WITH D) 500-200 MG-UNIT tablet Take 1 tablet by mouth 2 (two) times daily.   chlorhexidine (PERIDEX) 0.12 % solution Use as directed 15 mLs in the mouth or throat 2 (two) times daily.   Cod Liver Oil 1000 MG CAPS Take 1 capsule by mouth daily.    denosumab (PROLIA) 60 MG/ML SOLN injection Inject 60  mg into the skin every 6 (six) months. Administer in upper arm, thigh, or abdomen   docusate sodium (COLACE) 100 MG capsule Take 100 mg by mouth daily as needed for constipation. For constipation   hydrochlorothiazide (MICROZIDE) 12.5 MG capsule TAKE 1 CAPSULE EVERY MORNING   levothyroxine (SYNTHROID) 100 MCG tablet TAKE 1 TABLET EVERY MORNING   lovastatin (MEVACOR) 20 MG tablet TAKE 1 TABLET (20 MG TOTAL) BY MOUTH AT BEDTIME.   potassium chloride (KLOR-CON) 10 MEQ tablet TAKE 1 TABLET (10 MEQ TOTAL) BY MOUTH 2 (TWO) TIMES DAILY.   potassium chloride (KLOR-CON) 10 MEQ tablet TAKE 1 TABLET TWICE DAILY   pyridOXINE (VITAMIN B-6) 100 MG tablet Take 100 mg by mouth daily.   vitamin E 1000 UNIT capsule Take 1,000 Units by mouth daily.   XARELTO 20 MG TABS tablet TAKE 1 TABLET EVERY DAY WITH SUPPER.   Facility-Administered Encounter Medications as of 03/20/2021  Medication   [COMPLETED] denosumab (PROLIA) injection 60 mg   heparin lock flush 100 unit/mL   heparin lock flush 100 unit/mL   sodium chloride 0.9 % injection 10 mL   sodium chloride 0.9 % injection 10 mL    ALLERGIES:  Allergies  Allergen Reactions   Listerine [Antiseptic Mouth  Rinse]     Caused sore throat   Singulair [Montelukast Sodium] Other (See Comments)    Abdominal spasm      PHYSICAL EXAM:  ECOG Performance status: 1  Vitals:   03/20/21 1421  BP: (!) 142/60  Pulse: 68  Resp: 16  Temp: (!) 96.8 F (36 C)  SpO2: 100%    Filed Weights   03/20/21 1421  Weight: 180 lb 11.2 oz (82 kg)    Physical Exam Constitutional:      General: She is not in acute distress. HENT:     Head: Normocephalic.  Pulmonary:     Effort: No respiratory distress.  Neurological:     Mental Status: She is alert and oriented to person, place, and time.  Psychiatric:        Mood and Affect: Mood normal.        Behavior: Behavior normal.     LABORATORY DATA:  I have reviewed the labs as listed.  CBC    Component Value Date/Time   WBC 3.9 (L) 03/20/2021 1216   RBC 4.29 03/20/2021 1216   HGB 12.8 03/20/2021 1216   HCT 40.0 03/20/2021 1216   PLT 169 03/20/2021 1216   MCV 93.2 03/20/2021 1216   MCH 29.8 03/20/2021 1216   MCHC 32.0 03/20/2021 1216   RDW 14.7 03/20/2021 1216   LYMPHSABS 1.4 03/20/2021 1216   MONOABS 0.5 03/20/2021 1216   EOSABS 0.1 03/20/2021 1216   BASOSABS 0.1 03/20/2021 1216   CMP Latest Ref Rng & Units 03/20/2021 09/16/2020 08/24/2020  Glucose 70 - 99 mg/dL 109(H) 112(H) 99  BUN 8 - 23 mg/dL _0 Creatinine 0.44 - 1.00 mg/dL 0.92 0.93 0.85  Sodium 135 - 145 mmol/L 136 137 142  Potassium 3.5 - 5.1 mmol/L 3.7 3.7 3.8  Chloride 98 - 111 mmol/L 103 105 105  CO2 22 - 32 mmol/L _1 Calcium 8.9 - 10.3 mg/dL 9.2 9.2 9.6  Total Protein 6.5 - 8.1 g/dL 6.9 6.8 6.8  Total Bilirubin 0.3 - 1.2 mg/dL 0.8 0.8 0.5  Alkaline Phos 38 - 126 U/L 32(L) 36(L) 45  AST 15 - 41 U/L _2 ALT 0 - 44 U/L 16 18 15  All questions were answered to patient's stated satisfaction. Encouraged patient to call with any new concerns or questions before his next visit to the cancer center and we can certain see him sooner, if needed.     ASSESSMENT  & PLAN:  No problem-specific Assessment & Plan notes found for this encounter.  Stage IIIa ([id number]) left breast IDC: -Status post left MRM on 02/14/2011, showing multifocal tumor, largest measuring 1.5 cm, grade 3, deep surgical margins positive, ER/PR positive, HER-2 negative, Ki 67 45%, 6 out of 7 lymph nodes positive.  Status post 6 cycles of epirubicin and Cytoxan, followed by radiation therapy. -Anastrozole started on 11/19/2011.  She will complete for 10 years. -Today's physical examination not show any suspicious masses at the left mastectomy site.  Right breast has no palpable masses.  No adenopathy. -Last mammogram on 08/15/20, BIRADS category 1 negative. - labs today reviewed and overall reassuring.  - clinically asymptomatic.  -She will follow up in 6 months with repeat labs. Consider stopping anastrozole at next visit.    2.  Osteopenia: -Initial DEXA scan on 07/12/2016 showed T score of -1.4. -She was started on Prolia on 09/01/2014 which she is tolerating well. -Most recent dexa scan was January 2022 with T score of -1.3. - labs reviewed and acceptable for prolia today - plan to continue prolia every 6 months - vitamin d pending at dictation  3.  Thromboembolism: -She had a right leg DVT in 2012. -Bilateral pulmonary embolism was diagnosed in 2014. -She is currently on Xarelto indefinitely.  4. Fatigue - etiology unclear - encouraged her to discuss with her pcp.    Orders placed this encounter:  No orders of the defined types were placed in this encounter.  Return to clinic  Mammogram January 2022 RTC in 6 months for labs, follow up   I discussed the assessment and treatment plan with the patient. The patient was provided an opportunity to ask questions and all were answered. The patient agreed with the plan and demonstrated an understanding of the instructions.   The patient was advised to call back or seek an in-person evaluation if the symptoms worsen or if the  condition fails to improve as anticipated.   I spent 20 minutes face-to-face video visit time dedicated to the care of this patient on the date of this encounter to include pre-visit review of medical oncology notes, labs, imaging (mammogram & bone density), face-to-face time with the patient, and post visit ordering of testing/documentation.    Beckey Rutter, DNP, AGNP-C [facility] [place] [facility] [phone]

## 2021-03-20 NOTE — Patient Instructions (Signed)
[facility] at [facility] Discharge Instructions  You were seen today by Beckey Rutter, NP. She discussed your recent lab work and everything looks good. We will get you scheduled for your next Mammogram in January 2023. You received your Prolia injection today.  Please follow up as scheduled.   Thank you for choosing [facility] to provide your oncology and hematology care.  To afford each patient quality time with our provider, please arrive at least 15 minutes before your scheduled appointment time.   If you have a lab appointment with the [facility] please come in thru the Main Entrance and check in at the main information desk.  You need to re-schedule your appointment should you arrive 10 or more minutes late.  We strive to give you quality time with our providers, and arriving late affects you and other patients whose appointments are after yours.  Also, if you no show three or more times for appointments you may be dismissed from the clinic at the providers discretion.     Again, thank you for choosing [facility].  Our hope is that these requests will decrease the amount of time that you wait before being seen by our physicians.       _____________________________________________________________  Should you have questions after your visit to [facility], please contact our office at [phone] and follow the prompts.  Our office hours are 8:00 a.m. and 4:30 p.m. Monday - Friday.  Please note that voicemails left after 4:00 p.m. may not be returned until the following business day.  We are closed weekends and major holidays.  You do have access to a nurse 24-7, just call the main number to the clinic [phone] and do not press any options, hold on the line and a nurse will answer the phone.    For prescription refill requests, have your pharmacy contact our office and allow 72 hours.    Due to  Covid, you will need to wear a mask upon entering the hospital. If you do not have a mask, a mask will be given to you at the Main Entrance upon arrival. For doctor visits, patients may have 1 support person age 85 or older with them. For treatment visits, patients can not have anyone with them due to social distancing guidelines and our immunocompromised population.

## 2021-03-20 NOTE — Progress Notes (Signed)
Patient is taking anastrozole as prescribed.  They have not missed any doses and report no side effects at this time.   

## 2021-03-20 NOTE — Progress Notes (Signed)
Patient taking calcium as directed.  Denied tooth, jaw, and leg pain.  No recent or upcoming dental visits.  Labs reviewed.  Patient tolerated injection with no complaints voiced.  See MAR for details.  Patient stable during and after injection.  Site clean and dry with no bruising or swelling noted.  Band aid applied.  Vss with discharge and left in satisfactory condition with no s/s of distress noted.   

## 2021-03-22 ENCOUNTER — Ambulatory Visit (INDEPENDENT_AMBULATORY_CARE_PROVIDER_SITE_OTHER): Payer: Medicare HMO | Admitting: Family Medicine

## 2021-03-22 ENCOUNTER — Encounter: Payer: Self-pay | Admitting: Family Medicine

## 2021-03-22 ENCOUNTER — Ambulatory Visit (HOSPITAL_COMMUNITY)
Admission: RE | Admit: 2021-03-22 | Discharge: 2021-03-22 | Disposition: A | Discharge status: Home / Self Care | Payer: Medicare HMO | Source: Ambulatory Visit | Attending: Family Medicine | Admitting: Family Medicine

## 2021-03-22 ENCOUNTER — Other Ambulatory Visit: Payer: Self-pay

## 2021-03-22 VITALS — BP 145/75 | HR 70 | Temp 98.1°F | Resp 18 | Ht 64.0 in | Wt 179.1 lb

## 2021-03-22 DIAGNOSIS — R0602 Shortness of breath: Secondary | ICD-10-CM | POA: Insufficient documentation

## 2021-03-22 DIAGNOSIS — I10 Essential (primary) hypertension: Secondary | ICD-10-CM | POA: Diagnosis not present

## 2021-03-22 DIAGNOSIS — E039 Hypothyroidism, unspecified: Secondary | ICD-10-CM | POA: Diagnosis not present

## 2021-03-22 DIAGNOSIS — K219 Gastro-esophageal reflux disease without esophagitis: Secondary | ICD-10-CM

## 2021-03-22 DIAGNOSIS — D519 Vitamin B12 deficiency anemia, unspecified: Secondary | ICD-10-CM | POA: Diagnosis not present

## 2021-03-22 DIAGNOSIS — E7849 Other hyperlipidemia: Secondary | ICD-10-CM | POA: Diagnosis not present

## 2021-03-22 DIAGNOSIS — Z599 Problem related to housing and economic circumstances, unspecified: Secondary | ICD-10-CM

## 2021-03-22 DIAGNOSIS — Z0000 Encounter for general adult medical examination without abnormal findings: Secondary | ICD-10-CM

## 2021-03-22 DIAGNOSIS — R5383 Other fatigue: Secondary | ICD-10-CM | POA: Diagnosis not present

## 2021-03-22 NOTE — Patient Instructions (Signed)
F/U in 4 months, call if you need me sooner  Labs today, lipid, TSH and vit B12  CXR today   Please call and come for flu vaccine in September  Please get shingrix vaccines at your pharmacy  You are referred to Cardiology  Nurse please refer to [facility], issues with food, finances and mobility, pt asks that discussion involve her son also  Thanks for choosing [facility], we consider it a privelige to serve you.  Careful not to fall

## 2021-03-22 NOTE — Assessment & Plan Note (Addendum)
Annual exam as documented.  Immunization needs are specifically addressed at this visit.  

## 2021-03-22 NOTE — Progress Notes (Signed)
    Lisa Klein     MRN: [id number]      DOB: 1929-12-05  HPI: Patient is in for annual physical exam. C/o weak spells esp after eating , and indigestion/ uncontrolled GERD symptoms x 1 year. C/o weakness and exertional fatigue. C/o inability to grocery shop, limited finances, son is main caregiver, she lives alone, lost a sibling recently and that has her somewhat depressed  labs,  are reviewed. Immunization is reviewed , and  will return for flu vaccine/ get it at the pharmacy   PE: BP (!) 145/75   Pulse 70   Temp 98.1 F (36.7 C)   Resp 18   Ht '5\' 4"'$  (1.626 m)   Wt 179 lb 1.3 oz (81.2 kg)   SpO2 98%   BMI 30.74 kg/m   Pleasant  female, alert and oriented x 3, in no cardio-pulmonary distress.Appears physically tired Afebrile. HEENT No facial trauma or asymetry. Sinuses non tender.  Extra occullar muscles intact.. External ears normal, . Neck: decreased ROM, no adenopathy,JVD or thyromegaly.No bruits.  Chest: Clear to ascultation bilaterally.No crackles or wheezes. Non tender to palpation    Cardiovascular system S1, S2 no S3, murmur, no thrill.  Peripheral pulses normal.  Abdomen: Soft, non tender, . No guarding, tenderness or rebound.      Musculoskeletal exam: Decreased  ROM of spine, hips , shoulders and knees. Neurologic: Cranial nerves 2 to 12 intact. Power, tone ,sensation and reflexes normal throughout.  disturbance in gait. No tremor.  Skin: Intact, no ulceration, erythema , scaling or rash noted. Pigmentation normal throughout  Psych; Slightly depressed/ grieving mood and affect. Judgement and concentration normal   Assessment & Plan:  Annual physical exam  Annual exam as documented. Immunization  needs are specifically addressed at this visit.   Shortness of breath Progressive shortness of breath with minimal activity over the past several months referred to cardiology.  Fatigue 53-monthhistory of fatigue which is worsening  both with minimal activity and even at rest referred to cardiology.  Chest x-ray today.  GERD (gastroesophageal reflux disease) 1 year history of GERD symptoms which is now progressing trial of low-dose Protonix.

## 2021-03-23 LAB — VITAMIN B12: Vitamin B-12: 890 pg/mL (ref 232–1245)

## 2021-03-23 LAB — LIPID PANEL
Chol/HDL Ratio: 2.4 ratio (ref 0.0–4.4)
Cholesterol, Total: 153 mg/dL (ref 100–199)
HDL: 65 mg/dL
LDL Chol Calc (NIH): 76 mg/dL (ref 0–99)
Triglycerides: 58 mg/dL (ref 0–149)
VLDL Cholesterol Cal: 12 mg/dL (ref 5–40)

## 2021-03-23 LAB — TSH: TSH: 3.99 u[IU]/mL (ref 0.450–4.500)

## 2021-03-27 ENCOUNTER — Telehealth: Payer: Self-pay | Admitting: *Deleted

## 2021-03-27 ENCOUNTER — Encounter: Payer: Self-pay | Admitting: Family Medicine

## 2021-03-27 ENCOUNTER — Telehealth: Payer: Self-pay | Admitting: Family Medicine

## 2021-03-27 DIAGNOSIS — R0602 Shortness of breath: Secondary | ICD-10-CM | POA: Insufficient documentation

## 2021-03-27 DIAGNOSIS — K219 Gastro-esophageal reflux disease without esophagitis: Secondary | ICD-10-CM | POA: Insufficient documentation

## 2021-03-27 DIAGNOSIS — R5383 Other fatigue: Secondary | ICD-10-CM | POA: Insufficient documentation

## 2021-03-27 MED ORDER — PANTOPRAZOLE SODIUM 20 MG PO TBEC: 20.0000 mg | DELAYED_RELEASE_TABLET | Freq: Every day | ORAL | 4 refills | Status: DC

## 2021-03-27 NOTE — Assessment & Plan Note (Signed)
Progressive shortness of breath with minimal activity over the past several months referred to cardiology.

## 2021-03-27 NOTE — Assessment & Plan Note (Signed)
51-monthhistory of fatigue which is worsening both with minimal activity and even at rest referred to cardiology.  Chest x-ray today.

## 2021-03-27 NOTE — Telephone Encounter (Signed)
   Telephone encounter was:  Successful.  03/27/2021 Name: [name] MRN: [id number] DOB: February 20, 1930  Lisa Klein is a 87 y.o. year old female who is a primary care patient of Moshe Cipro Norwood Levo, MD . The community resource team was consulted for assistance with patient is applying 3 way call for extra help , conducted 3 way call to complete application for the program patient needs food and money to repair roof.  Care guide performed the following interventions: Patient provided with information about care guide support team and interviewed to confirm resource needs Follow up call placed to community resources to determine status of patients referral.  Follow Up Plan:   Will call pstient back as she is completing extra help application at this time  [facility], Care Management  [phone] [address] , [place] [address] Email : [email or web address] [email or web address]

## 2021-03-27 NOTE — Telephone Encounter (Signed)
Please call and let patient know that I have prescribed medication for heartburn/reflux.  I have sent sent in Protonix 20 mg 1 daily to [facility] thank you

## 2021-03-27 NOTE — Assessment & Plan Note (Signed)
1 year history of GERD symptoms which is now progressing trial of low-dose Protonix.

## 2021-03-27 NOTE — Telephone Encounter (Signed)
Patient aware.

## 2021-03-30 ENCOUNTER — Telehealth: Payer: Self-pay | Admitting: *Deleted

## 2021-03-30 NOTE — Telephone Encounter (Signed)
   Telephone encounter was:  Successful.  03/30/2021 Name: Lisa Klein MRN: [id number] DOB: Oct 02, 1929  Lisa Klein is a 87 y.o. year old female who is a primary care patient of Moshe Cipro Norwood Levo, MD . The community resource team was consulted for assistance with Called patient and called shipp 3 way calling as her previous call had been ended and she had been unable to reach the previous Counselor , Left call patient providing address and information for medicaid application to be mailed   Care guide performed the following interventions: .  Follow Up Plan: Will call back in a few days to make sure call was completed  [place], Care Management  [phone] [address] , [place] [address] Email : Ashby [email or web address]. [email or web address]

## 2021-04-11 ENCOUNTER — Telehealth: Payer: Self-pay | Admitting: *Deleted

## 2021-04-11 NOTE — Telephone Encounter (Signed)
   Telephone encounter was:  Unsuccessful.  04/11/2021 Name: Lisa Klein MRN: [id number] DOB: 1929-12-13  Unsuccessful outbound call made today to assist with:  Food Insecurity  Outreach Attempt:  2nd Attempt  A HIPAA compliant voice message was left requesting a return call.  Instructed patient to call back at   Instructed patient to call back at [phone]  at their earliest convenience. .  [place], Care Management  [phone] [address] , [place] [address] Email : Ashby [email or web address]. [email or web address] [email or web address]

## 2021-04-12 ENCOUNTER — Telehealth: Payer: Self-pay | Admitting: *Deleted

## 2021-04-12 NOTE — Telephone Encounter (Signed)
   Telephone encounter was:  Unsuccessful.  04/12/2021 Name: Lisa Klein MRN: [id number] DOB: 1929-10-04  Unsuccessful outbound call made today to assist with:  Home ModificationsThe community resource team was consulted for assistance with patient is applying 3 way call for extra help , conducted 3 way call to complete application for the program patient needs food and money to repair roof.  Outreach Attempt:  3rd Attempt.  Referral closed unable to contact patient.  A HIPAA compliant voice message was left requesting a return call.  Instructed patient to call back at   Instructed patient to call back at [phone]  at their earliest convenience. . [place], Care Management  [phone] [address] , [place] [address] Email : [email or web address]

## 2021-05-03 ENCOUNTER — Other Ambulatory Visit: Payer: Self-pay | Admitting: Family Medicine

## 2021-06-02 ENCOUNTER — Ambulatory Visit: Payer: Medicare HMO | Admitting: Cardiology

## 2021-06-02 ENCOUNTER — Other Ambulatory Visit: Payer: Self-pay

## 2021-06-02 ENCOUNTER — Ambulatory Visit (INDEPENDENT_AMBULATORY_CARE_PROVIDER_SITE_OTHER): Payer: Medicare HMO

## 2021-06-02 VITALS — BP 135/78 | HR 86 | Ht 60.0 in | Wt 178.0 lb

## 2021-06-02 DIAGNOSIS — R531 Weakness: Secondary | ICD-10-CM | POA: Diagnosis not present

## 2021-06-02 DIAGNOSIS — R0609 Other forms of dyspnea: Secondary | ICD-10-CM

## 2021-06-02 DIAGNOSIS — T733XXA Exhaustion due to excessive exertion, initial encounter: Secondary | ICD-10-CM

## 2021-06-02 NOTE — Patient Instructions (Signed)
Medication Instructions:  Your physician recommends that you continue on your current medications as directed. Please refer to the Current Medication list given to you today.  *If you need a refill on your cardiac medications before your next appointment, please call your pharmacy*   Testing/Procedures: Your physician has requested that you have a lexiscan myoview. For further information please visit [email or web address]. Please follow instruction sheet, as given.  Your physician has requested that you have an echocardiogram. Echocardiography is a painless test that uses sound waves to create images of your heart. It provides your doctor with information about the size and shape of your heart and how well your heart's chambers and valves are working. This procedure takes approximately one hour. There are no restrictions for this procedure.  Your physician has recommended that you wear an event monitor. Event monitors are medical devices that record the heart's electrical activity. Doctors most often [place] these monitors to diagnose arrhythmias. Arrhythmias are problems with the speed or rhythm of the heartbeat. The monitor is a small, portable device. You can wear one while you do your normal daily activities. This is usually used to diagnose what is causing palpitations/syncope (passing out).  Follow-Up: At [facility], you and your health needs are our priority.  As part of our continuing mission to provide you with exceptional heart care, we have created designated Provider Care Teams.  These Care Teams include your primary Cardiologist (physician) and Advanced Practice Providers (APPs -  Physician Assistants and Nurse Practitioners) who all work together to provide you with the care you need, when you need it. Follow up with Dr. Radford Pax as needed based on results of testing  [place] Instructions  Your physician has requested you wear a ZIO patch monitor for 14 days.  This is  a single patch monitor. [employer] supplies one patch monitor per enrollment. Additional stickers are not available. Please do not apply patch if you will be having a Nuclear Stress Test,  Echocardiogram, Cardiac CT, MRI, or Chest Xray during the period you would be wearing the  monitor. The patch cannot be worn during these tests. You cannot remove and re-apply the  ZIO XT patch monitor.  Your ZIO patch monitor will be mailed 3 day USPS to your address on file. It may take 3-5 days  to receive your monitor after you have been enrolled.  Once you have received your monitor, please review the enclosed instructions. Your monitor  has already been registered assigning a specific monitor serial # to you.  Billing and Patient Assistance Program Information  We have supplied [employer] with any of your insurance information on file for billing purposes. [employer] offers a sliding scale Patient Assistance Program for patients that do not have  insurance, or whose insurance does not completely cover the cost of the ZIO monitor.  You must apply for the Patient Assistance Program to qualify for this discounted rate.  To apply, please call [employer] at [phone], select option 4, select option 2, ask to apply for  Patient Assistance Program. Theodore Demark will ask your household income, and how many people  are in your household. They will quote your out-of-pocket cost based on that information.  [employer] will also be able to set up a 2-month, interest-free payment plan if needed.  Applying the monitor   Shave hair from upper left chest.  Hold abrader disc by orange tab. Rub abrader in 40 strokes over the upper left chest as  indicated in your  monitor instructions.  Clean area with 4 enclosed alcohol pads. Let dry.  Apply patch as indicated in monitor instructions. Patch will be placed under collarbone on left  side of chest with arrow pointing upward.  Rub patch adhesive wings for 2 minutes. Remove white label  marked "1". Remove the white  label marked "2". Rub patch adhesive wings for 2 additional minutes.  While looking in a mirror, press and release button in center of patch. A small green light will  flash 3-4 times. This will be your only indicator that the monitor has been turned on.  Do not shower for the first 24 hours. You may shower after the first 24 hours.  Press the button if you feel a symptom. You will hear a small click. Record Date, Time and  Symptom in the Patient Logbook.  When you are ready to remove the patch, follow instructions on the last 2 pages of Patient  Logbook. Stick patch monitor onto the last page of Patient Logbook.  Place Patient Logbook in the blue and white box. Use locking tab on box and tape box closed  securely. The blue and white box has prepaid postage on it. Please place it in the mailbox as  soon as possible. Your physician should have your test results approximately 7 days after the  monitor has been mailed back to [facility].  Call [place] at [phone] if you have questions regarding  your ZIO XT patch monitor. Call them immediately if you see an orange light blinking on your  monitor.  If your monitor falls off in less than 4 days, contact our Monitor department at [phone].  If your monitor becomes loose or falls off after 4 days call [employer] at [phone] for  suggestions on securing your monitor

## 2021-06-02 NOTE — Progress Notes (Signed)
Cardiology CONSULT Note    Date:  06/02/2021   ID:  Lisa Klein, DOB 10-23-1929, MRN [id number]  PCP:  [name], MD  Cardiologist:  Fransico Him, MD   Chief Complaint  Patient presents with   New Patient (Initial Visit)    Weakness, exertional fatigue, hypertension, dyspnea on exertion     History of Present Illness:  Lisa Klein is a 87 y.o. female who is being seen today for the evaluation of exertional fatigue and weak spells especially after eating as well as DOE with minimal activity at the request of [name], MD.  Female with a history of breast cancer, DVT of the right lower extremity in 2012 and then PE in 2016 on long-term anticoagulation, DJD, hypertension, hypothyroidism and obesity who was referred by her PCP today for evaluation of exertional fatigue as well as dyspnea on exertion with minimal activity and weak spells after eating.  She tells me that she had XRT with her breast CA in 2012 and since then she has not had as much energy.  She thinks that part of her exertional fatigue is her age.  She thinks that her fatigue has gotten a little worse over the years.  She is able to bathe and dress herself and occasionally will get tired some days but other days she is fine.  She cannot do as much housework as she used to due to getting fatigued which is new.  She tells me that she has not had any chest pain or pressure but has had some DOE.  She has indigestion and recently started on Protonix and her sx have improved.  She sometimes will get SOB and have to sit down and rest.  She denies any dizziness or syncope.  She denies any LE edema but has knee swelling related to arthritis.  She tells me that when she eats she does not feel like getting up and washing dishes after she eats and feels limp like her body is giving out but it resolves after she rests.   Past Medical History:  Diagnosis Date   Cancer ([place]) 2012   LEFT BREAST   Clotting  disorder ([place]) 05/2011   dvt, right leg   Degenerative disc disease    with nerve compression    DJD (degenerative joint disease)    Hypertension    Hypertension    Hypothyroidism    Hypothyroidism    Long term current use of anticoagulant therapy 12/07/2014   Pulmonary embolus    Obesity    Osteopenia 08/24/2014    Past Surgical History:  Procedure Laterality Date   ABDOMINAL HYSTERECTOMY  1980   fibroids   BREAST SURGERY  2012   left total mastectomy   CHOLECYSTECTOMY  80'S   [facility]   CHOLECYSTECTOMY     HIP PINNING,CANNULATED Left 01/19/2013   Procedure: CANNULATED HIP PINNING-  left;  Surgeon: Marin Shutter, MD;  Location: [facility];  Service: Orthopedics;  Laterality: Left;   MASTECTOMY MODIFIED RADICAL  02/14/11   left   PORT-A-CATH REMOVAL N/A 12/05/2012   Procedure: MINOR REMOVAL PORT-A-CATH;  Surgeon: Jamesetta So, MD;  Location: [facility];  Service: General;  Laterality: N/A;  In Minor Room   PORTACATH PLACEMENT  04/16/2011   Procedure: INSERTION PORT-A-CATH;  Surgeon: Jamesetta So;  Location: [facility];  Service: General;  Laterality: Right;  right subclavian   VESICOVAGINAL FISTULA CLOSURE W/ TAH     [facility]  Current Medications: Current Meds  Medication Sig   acetaminophen (TYLENOL) 650 MG CR tablet Take 650 mg by mouth 2 (two) times daily.   anastrozole (ARIMIDEX) 1 MG tablet TAKE 1 TABLET AT BEDTIME   Biotin w/ Vitamins C & E (HAIR/SKIN/NAILS PO) Take by mouth.   calcium-vitamin D (OSCAL WITH D) 500-200 MG-UNIT tablet Take 1 tablet by mouth 2 (two) times daily.   chlorhexidine (PERIDEX) 0.12 % solution Use as directed 15 mLs in the mouth or throat 2 (two) times daily.   Cod Liver Oil 1000 MG CAPS Take 1 capsule by mouth daily.    denosumab (PROLIA) 60 MG/ML SOLN injection Inject 60 mg into the skin every 6 (six) months. Administer in upper arm, thigh, or abdomen   docusate sodium (COLACE) 100 MG capsule Take 100 mg by mouth daily as needed for constipation. For constipation    hydrochlorothiazide (MICROZIDE) 12.5 MG capsule TAKE 1 CAPSULE EVERY MORNING   levothyroxine (SYNTHROID) 100 MCG tablet TAKE 1 TABLET EVERY MORNING   lovastatin (MEVACOR) 20 MG tablet TAKE 1 TABLET (20 MG TOTAL) BY MOUTH AT BEDTIME.   pantoprazole (PROTONIX) 20 MG tablet Take 1 tablet (20 mg total) by mouth daily.   potassium chloride (KLOR-CON) 10 MEQ tablet TAKE 1 TABLET (10 MEQ TOTAL) BY MOUTH 2 (TWO) TIMES DAILY.   potassium chloride (KLOR-CON) 10 MEQ tablet TAKE 1 TABLET TWICE DAILY   pyridOXINE (VITAMIN B-6) 100 MG tablet Take 100 mg by mouth daily.   vitamin E 1000 UNIT capsule Take 1,000 Units by mouth daily.   XARELTO 20 MG TABS tablet TAKE 1 TABLET EVERY DAY WITH SUPPER.    Allergies:   Listerine [antiseptic mouth rinse] and Singulair [montelukast sodium]   Social History   Socioeconomic History   Marital status: Widowed    Spouse name: Not on file   Number of children: 2   Years of education: 34   Highest education level: 12th grade  Occupational History   Occupation: [employer] part time    Occupation: retired/disabled   Tobacco Use   Smoking status: Never   Smokeless tobacco: Never  Substance and Sexual Activity   Alcohol use: No   Drug use: No   Sexual activity: Not Currently    Birth control/protection: Post-menopausal  Other Topics Concern   Not on file  Social History Narrative   Reports starting to have a lot of trouble doing things for herself. Lives alone, tries to drive to places, but it is getting increasingly harder to do.    Social Determinants of Health   Financial Resource Strain: Low Risk    Difficulty of Paying Living Expenses: Not hard at all  Food Insecurity: No Food Insecurity   Worried About Charity fundraiser in the Last Year: Never true   [place] in the Last Year: Never true  Transportation Needs: No Transportation Needs   Lack of Transportation (Medical): No   Lack of Transportation (Non-Medical): No  Physical Activity:  Insufficiently Active   Days of Exercise per Week: 5 days   Minutes of Exercise per Session: 10 min  Stress: No Stress Concern Present   Feeling of Stress : Not at all  Social Connections: Moderately Isolated   Frequency of Communication with Friends and Family: More than three times a week   Frequency of Social Gatherings with Friends and Family: More than three times a week   Attends Religious Services: More than 4 times per year   Active Member  of Clubs or Organizations: No   Attends Archivist Meetings: Never   Marital Status: Widowed     Family History:  The patient's family history includes Arthritis in her mother; COPD in her father; Heart disease in her son; Hypertension in her sister and sister; Lung disease in her father.   ROS:   Please see the history of present illness.    ROS All other systems reviewed and are negative.  No flowsheet data found.     PHYSICAL EXAM:   VS:  Ht 5' (1.524 m)   Wt 178 lb (80.7 kg)   BMI 34.76 kg/m    GEN: Well nourished, well developed, in no acute distress  HEENT: normal  Neck: no JVD, carotid bruits, or masses Cardiac: RRR; no murmurs, rubs, or gallops,no edema.  Intact distal pulses bilaterally.  Respiratory:  clear to auscultation bilaterally, normal work of breathing GI: soft, nontender, nondistended, + BS MS: no deformity or atrophy  Skin: warm and dry, no rash Neuro:  Alert and Oriented x 3, Strength and sensation are intact Psych: euthymic mood, full affect  Wt Readings from Last 3 Encounters:  06/02/21 178 lb (80.7 kg)  03/22/21 179 lb 1.3 oz (81.2 kg)  03/20/21 180 lb 11.2 oz (82 kg)      Studies/Labs Reviewed:   EKG:  EKG is ordered today.  The ekg ordered today demonstrates NSR with no ST changes  Recent Labs: 03/20/2021: ALT 16; BUN 14; Creatinine, Ser 0.92; Hemoglobin 12.8; Platelets 169; Potassium 3.7; Sodium 136 03/22/2021: TSH 3.990   Lipid Panel    Component Value Date/Time   CHOL 153  03/22/2021 1126   TRIG 58 03/22/2021 1126   HDL 65 03/22/2021 1126   CHOLHDL 2.4 03/22/2021 1126   CHOLHDL 2.6 09/08/2019 1308   VLDL 11 09/08/2019 1308   LDLCALC 76 03/22/2021 1126   LDLCALC 82 02/10/2019 0909    Additional studies/ records that were reviewed today include:  OV notes from PCP, EKG    ASSESSMENT:    1. Dyspnea on exertion   2. Weakness   3. Fatigue due to excessive exertion, initial encounter      PLAN:  In order of problems listed above:  DOE -She has had DOE ever since her XRT for her breast CA but it has progressively worsened -Check 2D echocardiogram to assess LV function and rule out valvular heart disease -We will get a Lexiscan Myoview to rule out coronary ischemia as etiology of shortness of breath, weak spells after eating and exertional fatigue -Shared Decision Making/Informed Consent The risks [chest pain, shortness of breath, cardiac arrhythmias, dizziness, blood pressure fluctuations, myocardial infarction, stroke/transient ischemic attack, nausea, vomiting, allergic reaction, radiation exposure, metallic taste sensation and life-threatening complications (estimated to be 1 in 10,000)], benefits (risk stratification, diagnosing coronary artery disease, treatment guidance) and alternatives of a nuclear stress test were discussed in detail with Ms. Puerto and she agrees to proceed.  2.  Weak spells -Unclear etiology -These seem to be more prominent after eating which makes me concerned that this could be an anginal equivalent secondary to coronary ischemia -See #1 we will get nuclear stress test -I will also get a 2 week ziopatch  to rule out arrhythmia since occasionally she will feel her heart race  3.  Exertional fatigue -See #1 we will get Lexiscan Myoview to rule out coronary ischemia  4.  HTN -BP controlled on exam today. -Continue prescription drug management with HCTZ 12.5 mg daily  Time Spent: 25 minutes total time of encounter,  including 20 minutes spent in face-to-face patient care on the date of this encounter. This time includes coordination of care and counseling regarding above mentioned problem list. Remainder of non-face-to-face time involved reviewing chart documents/testing relevant to the patient encounter and documentation in the medical record. I have independently reviewed documentation from referring provider  Medication Adjustments/Labs and Tests Ordered: Current medicines are reviewed at length with the patient today.  Concerns regarding medicines are outlined above.  Medication changes, Labs and Tests ordered today are listed in the Patient Instructions below.  There are no Patient Instructions on file for this visit.   Signed, Fransico Him, MD  06/02/2021 2:33 PM    [address], [place], [place]  [address] Phone: [phone]; Fax: [phone]

## 2021-06-02 NOTE — Addendum Note (Signed)
Addended by: Antonieta Iba on: 06/02/2021 02:58 PM   Modules accepted: Orders

## 2021-06-08 ENCOUNTER — Other Ambulatory Visit: Payer: Self-pay | Admitting: Family Medicine

## 2021-06-12 ENCOUNTER — Encounter (HOSPITAL_COMMUNITY)
Admission: RE | Admit: 2021-06-12 | Discharge: 2021-06-12 | Disposition: A | Discharge status: Home / Self Care | Payer: Medicare HMO | Source: Ambulatory Visit | Attending: Cardiology | Admitting: Cardiology

## 2021-06-12 ENCOUNTER — Ambulatory Visit (HOSPITAL_COMMUNITY)
Admission: RE | Admit: 2021-06-12 | Discharge: 2021-06-12 | Disposition: A | Discharge status: Home / Self Care | Payer: Medicare HMO | Source: Ambulatory Visit | Attending: Cardiology | Admitting: Cardiology

## 2021-06-12 ENCOUNTER — Encounter (HOSPITAL_COMMUNITY): Payer: Self-pay | Admitting: Hematology

## 2021-06-12 ENCOUNTER — Other Ambulatory Visit: Payer: Self-pay

## 2021-06-12 DIAGNOSIS — R531 Weakness: Secondary | ICD-10-CM | POA: Diagnosis not present

## 2021-06-12 DIAGNOSIS — T733XXA Exhaustion due to excessive exertion, initial encounter: Secondary | ICD-10-CM | POA: Insufficient documentation

## 2021-06-12 DIAGNOSIS — R0609 Other forms of dyspnea: Secondary | ICD-10-CM | POA: Insufficient documentation

## 2021-06-12 LAB — NM MYOCAR MULTI W/SPECT W/WALL MOTION / EF
LV dias vol: 77 mL (ref 46–106)
LV sys vol: 33 mL
Nuc Stress EF: 57 %
Peak HR: 96 {beats}/min
RATE: 0.4
Rest HR: 67 {beats}/min
Rest Nuclear Isotope Dose: 10.5 mCi
SDS: 5
SRS: 2
SSS: 7
ST Depression (mm): 0 mm
Stress Nuclear Isotope Dose: 31.1 mCi
TID: 1.11

## 2021-06-12 MED ORDER — SODIUM CHLORIDE FLUSH 0.9 % IV SOLN
INTRAVENOUS | Status: AC
  Administered 2021-06-12: 10 mL via INTRAVENOUS
  Filled 2021-06-12: qty 10

## 2021-06-12 MED ORDER — TECHNETIUM TC 99M TETROFOSMIN IV KIT
10.0000 | PACK | Freq: Once | INTRAVENOUS | Status: AC | PRN
  Administered 2021-06-12: 10.5 via INTRAVENOUS

## 2021-06-12 MED ORDER — TECHNETIUM TC 99M TETROFOSMIN IV KIT
30.0000 | PACK | Freq: Once | INTRAVENOUS | Status: AC | PRN
  Administered 2021-06-12: 31.1 via INTRAVENOUS

## 2021-06-12 MED ORDER — REGADENOSON 0.4 MG/5ML IV SOLN
INTRAVENOUS | Status: AC
  Administered 2021-06-12: 0.4 mg via INTRAVENOUS
  Filled 2021-06-12: qty 5

## 2021-06-16 DIAGNOSIS — R531 Weakness: Secondary | ICD-10-CM

## 2021-06-16 DIAGNOSIS — R0609 Other forms of dyspnea: Secondary | ICD-10-CM | POA: Diagnosis not present

## 2021-06-16 DIAGNOSIS — T733XXD Exhaustion due to excessive exertion, subsequent encounter: Secondary | ICD-10-CM | POA: Diagnosis not present

## 2021-06-19 DIAGNOSIS — H2513 Age-related nuclear cataract, bilateral: Secondary | ICD-10-CM | POA: Diagnosis not present

## 2021-06-19 DIAGNOSIS — H401232 Low-tension glaucoma, bilateral, moderate stage: Secondary | ICD-10-CM | POA: Diagnosis not present

## 2021-06-21 ENCOUNTER — Telehealth: Payer: Self-pay

## 2021-06-21 NOTE — Telephone Encounter (Signed)
Lisa Him, MD: Please let patient know that stress test was fine showed normal blood flow but blood pressure was elevated.  Please get a 48-hour blood pressure monitor    Pt notified and verbalized understanding. Pt will have caregiver obtain monitor from [facility]. Per T. Radford Pax, MD

## 2021-06-21 NOTE — Telephone Encounter (Signed)
-----   Message from Sueanne Margarita, MD sent at 06/15/2021 11:25 AM EST ----- Can patient's family member come down to [facility] and get 1 of hours ----- Message ----- From: Berlinda Last, CMA Sent: 06/15/2021  11:06 AM EST To: Sueanne Margarita, MD  [facility] does not have the 48 hour blood pressure monitors now. Please advise

## 2021-06-23 DIAGNOSIS — R0609 Other forms of dyspnea: Secondary | ICD-10-CM | POA: Diagnosis not present

## 2021-06-23 DIAGNOSIS — R531 Weakness: Secondary | ICD-10-CM | POA: Diagnosis not present

## 2021-07-04 ENCOUNTER — Telehealth: Payer: Self-pay

## 2021-07-04 DIAGNOSIS — I471 Supraventricular tachycardia: Secondary | ICD-10-CM

## 2021-07-04 NOTE — Telephone Encounter (Signed)
I spoke with patient and discussed monitor results. I have placed referral to EP. Patient asked I speak with her daughter, Claiborne Billings. I left a message for her to call back.

## 2021-07-04 NOTE — Telephone Encounter (Signed)
Lisa Margarita, MD  07/04/2021  9:03 AM EST Back to Top    Heart monitor showed episodes of wide complex tachycardia from bottom of heart and SVT up to 19 beats in a row.  She also had some Wenkebach block during sleep.  Given her advanced age and intermittent AVB would avoid BB or CCB for suppression of SVT.  Please refer to Ep for recommendations.  She may be having post termination pauses from SVT causing sinking spells

## 2021-07-05 ENCOUNTER — Ambulatory Visit (INDEPENDENT_AMBULATORY_CARE_PROVIDER_SITE_OTHER): Payer: Medicare HMO

## 2021-07-05 ENCOUNTER — Other Ambulatory Visit: Payer: Self-pay

## 2021-07-05 DIAGNOSIS — Z0000 Encounter for general adult medical examination without abnormal findings: Secondary | ICD-10-CM

## 2021-07-05 NOTE — Telephone Encounter (Signed)
Patient's daughter returned call.

## 2021-07-05 NOTE — Patient Instructions (Signed)

## 2021-07-05 NOTE — Progress Notes (Signed)
I connected with  Lisa Klein on 07/05/21 by a audio enabled telemedicine application and verified that I am speaking with the correct person using two identifiers.  Patient Location: Home  Provider Location: Office/Clinic  I discussed the limitations of evaluation and management by telemedicine. The patient expressed understanding and agreed to proceed.   Subjective:   Lisa Klein is a 87 y.o. female who presents for Medicare Annual (Subsequent) preventive examination.  Review of Systems     Cardiac Risk Factors include: advanced age (>18men, >60 women);dyslipidemia;hypertension;sedentary lifestyle     Objective:    There were no vitals filed for this visit. There is no height or weight on file to calculate BMI.  Advanced Directives 03/20/2021 09/16/2020 06/16/2020 03/11/2020 03/08/2020 09/08/2019 02/25/2019  Does Patient Have a Medical Advance Directive? No No No No No No No  Would patient like information on creating a medical advance directive? No - Patient declined No - Patient declined Yes (MAU/Ambulatory/Procedural Areas - Information given) No - Patient declined No - Patient declined No - Patient declined No - Patient declined  Pre-existing out of facility DNR order (yellow form or pink MOST form) - - - - - - -    Current Medications (verified) Outpatient Encounter Medications as of 07/05/2021  Medication Sig   acetaminophen (TYLENOL) 650 MG CR tablet Take 650 mg by mouth 2 (two) times daily.   anastrozole (ARIMIDEX) 1 MG tablet TAKE 1 TABLET AT BEDTIME   Biotin w/ Vitamins C & E (HAIR/SKIN/NAILS PO) Take by mouth.   calcium-vitamin D (OSCAL WITH D) 500-200 MG-UNIT tablet Take 1 tablet by mouth 2 (two) times daily.   chlorhexidine (PERIDEX) 0.12 % solution Use as directed 15 mLs in the mouth or throat 2 (two) times daily.   Cod Liver Oil 1000 MG CAPS Take 1 capsule by mouth daily.    denosumab (PROLIA) 60 MG/ML SOLN injection Inject 60 mg into the skin every 6 (six)  months. Administer in upper arm, thigh, or abdomen   docusate sodium (COLACE) 100 MG capsule Take 100 mg by mouth daily as needed for constipation. For constipation   hydrochlorothiazide (MICROZIDE) 12.5 MG capsule TAKE 1 CAPSULE EVERY MORNING   levothyroxine (SYNTHROID) 100 MCG tablet TAKE 1 TABLET EVERY MORNING   lovastatin (MEVACOR) 20 MG tablet TAKE 1 TABLET (20 MG TOTAL) BY MOUTH AT BEDTIME.   pantoprazole (PROTONIX) 20 MG tablet Take 1 tablet (20 mg total) by mouth daily.   potassium chloride (KLOR-CON) 10 MEQ tablet TAKE 1 TABLET (10 MEQ TOTAL) BY MOUTH 2 (TWO) TIMES DAILY.   potassium chloride (KLOR-CON) 10 MEQ tablet TAKE 1 TABLET TWICE DAILY   pyridOXINE (VITAMIN B-6) 100 MG tablet Take 100 mg by mouth daily.   vitamin E 1000 UNIT capsule Take 1,000 Units by mouth daily.   XARELTO 20 MG TABS tablet TAKE 1 TABLET EVERY DAY WITH SUPPER.   Facility-Administered Encounter Medications as of 07/05/2021  Medication   heparin lock flush 100 unit/mL   heparin lock flush 100 unit/mL   sodium chloride 0.9 % injection 10 mL   sodium chloride 0.9 % injection 10 mL    Allergies (verified) Listerine [antiseptic mouth rinse] and Singulair [montelukast sodium]   History: Past Medical History:  Diagnosis Date   Cancer ([place]) 2012   LEFT BREAST   Clotting disorder ([place]) 05/2011   dvt, right leg   Degenerative disc disease    with nerve compression    DJD (degenerative joint disease)  Hypertension    Hypertension    Hypothyroidism    Hypothyroidism    Long term current use of anticoagulant therapy 12/07/2014   Pulmonary embolus    Obesity    Osteopenia 08/24/2014   Past Surgical History:  Procedure Laterality Date   ABDOMINAL HYSTERECTOMY  1980   fibroids   BREAST SURGERY  2012   left total mastectomy   CHOLECYSTECTOMY  80'S   [facility]   CHOLECYSTECTOMY     HIP PINNING,CANNULATED Left 01/19/2013   Procedure: CANNULATED HIP PINNING-  left;  Surgeon: Marin Shutter, MD;  Location: [facility];  Service: Orthopedics;  Laterality: Left;   MASTECTOMY MODIFIED RADICAL  02/14/11   left   PORT-A-CATH REMOVAL N/A 12/05/2012   Procedure: MINOR REMOVAL PORT-A-CATH;  Surgeon: Jamesetta So, MD;  Location: [facility];  Service: General;  Laterality: N/A;  In Minor Room   PORTACATH PLACEMENT  04/16/2011   Procedure: INSERTION PORT-A-CATH;  Surgeon: Jamesetta So;  Location: [facility];  Service: General;  Laterality: Right;  right subclavian   VESICOVAGINAL FISTULA CLOSURE W/ TAH     [facility]   Family History  Problem Relation Age of Onset   COPD Father    Lung disease Father    Hypertension Sister    Arthritis Mother    Hypertension Sister    Heart disease Son        stent   Anesthesia problems Neg Hx    Malignant hyperthermia Neg Hx    Hypotension Neg Hx    Pseudochol deficiency Neg Hx    Social History   Socioeconomic History   Marital status: Widowed    Spouse name: Not on file   Number of children: 2   Years of education: 80   Highest education level: 12th grade  Occupational History   Occupation: [employer] part time    Occupation: retired/disabled   Tobacco Use   Smoking status: Never   Smokeless tobacco: Never  Substance and Sexual Activity   Alcohol use: No   Drug use: No   Sexual activity: Not Currently    Birth control/protection: Post-menopausal  Other Topics Concern   Not on file  Social History Narrative   Reports starting to have a lot of trouble doing things for herself. Lives alone, tries to drive to places, but it is getting increasingly harder to do.    Social Determinants of Health   Financial Resource Strain: Low Risk    Difficulty of Paying Living Expenses: Not very hard  Food Insecurity: No Food Insecurity   Worried About Charity fundraiser in the Last Year: Never true   Ran Out of Food in the Last Year: Never true  Transportation Needs: No Transportation Needs   Lack of Transportation (Medical): No   Lack of Transportation (Non-Medical): No  Physical  Activity: Not on file  Stress: No Stress Concern Present   Feeling of Stress : Not at all  Social Connections: Moderately Isolated   Frequency of Communication with Friends and Family: Twice a week   Frequency of Social Gatherings with Friends and Family: Twice a week   Attends Religious Services: More than 4 times per year   Active Member of [employer] or Organizations: No   Attends Archivist Meetings: Never   Marital Status: Widowed    Tobacco Counseling Counseling given: Not Answered   Clinical Intake:  Pre-visit preparation completed: Yes  Pain : No/denies pain     Nutritional Status: BMI 25 -29  Overweight Diabetes: No  What is the last grade level you completed in school?: college  Diabetic? no  Interpreter Needed?: No      Activities of Daily Living In your present state of health, do you have any difficulty performing the following activities: 07/05/2021  Hearing? N  Vision? N  Difficulty concentrating or making decisions? N  Walking or climbing stairs? Y  Dressing or bathing? N  Doing errands, shopping? Y  Comment mobility is limited and scared of falling but good as long as she takes her time  Preparing Food and eating ? N  Using the Toilet? N  In the past six months, have you accidently leaked urine? N  Do you have problems with loss of bowel control? N  Managing your Medications? N  Managing your Finances? N  Housekeeping or managing your Housekeeping? N  Some recent data might be hidden    Patient Care Team: [name], MD as PCP - General Twana First, MD as Consulting Physician (Oncology) Sheldon Silvan, Manon Hilding, PA-C (Inactive) as Physician Assistant (Oncology)  Indicate any recent Medical Services you may have received from other than [facility] providers in the past year (date may be approximate).     Assessment:   This is a routine wellness examination for [place].  Hearing/Vision screen No results found.  Dietary issues and  exercise activities discussed: Current Exercise Habits: The patient does not participate in regular exercise at present, Exercise limited by: orthopedic condition(s);Other - see comments   Goals Addressed   None    Depression Screen PHQ 2/9 Scores 03/22/2021 08/17/2020 06/16/2020 06/16/2020 02/15/2020 08/10/2019 06/16/2019  PHQ - 2 Score 0 0 0 0 0 0 0    Fall Risk Fall Risk  07/05/2021 03/22/2021 03/22/2021 06/16/2020 02/15/2020  Falls in the past year? 0 1 1 0 0  Number falls in past yr: 0 1 1 0 0  Injury with Fall? 0 0 0 0 0  Risk Factor Category  - - - - -  Risk for fall due to : - - - No Fall Risks No Fall Risks  Follow up - - - Falls evaluation completed Falls evaluation completed    FALL RISK PREVENTION PERTAINING TO THE HOME:  Any stairs in or around the home? No  If so, are there any without handrails? No  Home free of loose throw rugs in walkways, pet beds, electrical cords, etc? Yes  Adequate lighting in your home to reduce risk of falls? Yes   ASSISTIVE DEVICES UTILIZED TO PREVENT FALLS:  Life alert? Yes  Use of a cane, walker or w/c? Yes  Grab bars in the bathroom? Yes  Shower chair or bench in shower? Yes  Elevated toilet seat or a handicapped toilet? Yes    Cognitive Function: MMSE - Mini Mental State Exam 06/16/2020  Not completed: Unable to complete     6CIT Screen 07/05/2021 06/16/2020 06/16/2019 06/12/2018 05/29/2017  What Year? 0 points 0 points 0 points 0 points 0 points  What month? 0 points 0 points 0 points 0 points 0 points  What time? 0 points 0 points 0 points 0 points 0 points  Count back from 20 0 points 0 points 0 points 0 points 0 points  Months in reverse 0 points 0 points 0 points 0 points 0 points  Repeat phrase 0 points 0 points 0 points 0 points 0 points  Total Score 0 0 0 0 0    Immunizations Immunization History  Administered Date(s) Administered  Fluad Quad(high Dose 65+) 06/11/2019   Influenza Whole 06/06/2009, 05/17/2010    Influenza,inj,Quad PF,6+ Mos 06/29/2013, 07/15/2014, 05/02/2015, 07/25/2016, 04/18/2017, 04/02/2018   Influenza-Unspecified 04/26/2020   Moderna Sars-Covid-2 Vaccination 10/06/2019, 11/03/2019   Pneumococcal Conjugate-13 03/03/2014   Pneumococcal Polysaccharide-23 08/07/2001   Td 02/29/2004   Tdap 11/13/2010   Zoster, Live 10/07/2006    TDAP status: Due, Education has been provided regarding the importance of this vaccine. Advised may receive this vaccine at local pharmacy or Health Dept. Aware to provide a copy of the vaccination record if obtained from local pharmacy or Health Dept. Verbalized acceptance and understanding.  Flu Vaccine status: Due, Education has been provided regarding the importance of this vaccine. Advised may receive this vaccine at local pharmacy or Health Dept. Aware to provide a copy of the vaccination record if obtained from local pharmacy or Health Dept. Verbalized acceptance and understanding.  Pneumococcal vaccine status: Completed during today's visit.  Covid-19 vaccine status: Information provided on how to obtain vaccines.   Qualifies for Shingles Vaccine? Yes   Zostavax completed No   Shingrix Completed?: No.    Education has been provided regarding the importance of this vaccine. Patient has been advised to call insurance company to determine out of pocket expense if they have not yet received this vaccine. Advised may also receive vaccine at local pharmacy or Health Dept. Verbalized acceptance and understanding.  Screening Tests Health Maintenance  Topic Date Due   COVID-19 Vaccine (3 - Moderna risk series) 12/01/2019   TETANUS/TDAP  11/12/2020   INFLUENZA VACCINE  03/06/2021   Zoster Vaccines- Shingrix (1 of 2) 10/03/2021 (Originally 07/01/1949)   Pneumonia Vaccine 22+ Years old  Completed   DEXA SCAN  Completed   HPV VACCINES  Aged Out    Health Maintenance  Health Maintenance Due  Topic Date Due   COVID-19 Vaccine (3 - Moderna risk series)  12/01/2019   TETANUS/TDAP  11/12/2020   INFLUENZA VACCINE  03/06/2021    Colorectal cancer screening: No longer required.   Mammogram status: Ordered  . Pt provided with contact info and advised to call to schedule appt.   Bone density- no longer needed  Lung Cancer Screening: (Low Dose CT Chest recommended if Age 49-80 years, 30 pack-year currently smoking OR have quit w/in 15years.) does not qualify.   Lung Cancer Screening Referral: no  Additional Screening:  Hepatitis C Screening: does qualify; Completed yes  Vision Screening: Recommended annual ophthalmology exams for early detection of glaucoma and other disorders of the eye. Is the patient up to date with their annual eye exam?  Yes  Who is the provider or what is the name of the office in which the patient attends annual eye exams? Seen eye dr recently for pressure in eyes If pt is not established with a provider, would they like to be referred to a provider to establish care? No .   Dental Screening: Recommended annual dental exams for proper oral hygiene  Community Resource Referral / Chronic Care Management: CRR required this visit?  No   CCM required this visit?  No      Plan:     I have personally reviewed and noted the following in the patient's chart:   Medical and social history Use of alcohol, tobacco or illicit drugs  Current medications and supplements including opioid prescriptions.  Functional ability and status Nutritional status Physical activity Advanced directives List of other physicians Hospitalizations, surgeries, and ER visits in previous 12 months Vitals Screenings to  include cognitive, depression, and falls Referrals and appointments  In addition, I have reviewed and discussed with patient certain preventive protocols, quality metrics, and best practice recommendations. A written personalized care plan for preventive services as well as general preventive health recommendations were  provided to patient.     Kate Sable, LPN, LPN   27/01/2375   Nurse Notes:  Ms. Suttles , Thank you for taking time to come for your Medicare Wellness Visit. I appreciate your ongoing commitment to your health goals. Please review the following plan we discussed and let me know if I can assist you in the future.   These are the goals we discussed:  Goals      Exercise 3x per week (30 min per time)     Starting today 07/25/2016 patient would like to increase exercise to at least 30 minutes 3 times a week.      LIFESTYLE - DECREASE FALLS RISK     Patient Stated     Would like to stay active to be able to keep doing for herself         This is a list of the screening recommended for you and due dates:  Health Maintenance  Topic Date Due   COVID-19 Vaccine (3 - Moderna risk series) 12/01/2019   Tetanus Vaccine  11/12/2020   Flu Shot  03/06/2021   Zoster (Shingles) Vaccine (1 of 2) 10/03/2021*   Pneumonia Vaccine  Completed   DEXA scan (bone density measurement)  Completed   HPV Vaccine  Aged Out  *Topic was postponed. The date shown is not the original due date.

## 2021-07-06 ENCOUNTER — Other Ambulatory Visit: Payer: Self-pay

## 2021-07-06 ENCOUNTER — Ambulatory Visit (HOSPITAL_COMMUNITY)
Admission: RE | Admit: 2021-07-06 | Discharge: 2021-07-06 | Disposition: A | Discharge status: Home / Self Care | Payer: Medicare HMO | Source: Ambulatory Visit | Attending: Family Medicine | Admitting: Family Medicine

## 2021-07-06 ENCOUNTER — Telehealth: Payer: Self-pay

## 2021-07-06 DIAGNOSIS — R0609 Other forms of dyspnea: Secondary | ICD-10-CM | POA: Insufficient documentation

## 2021-07-06 LAB — ECHOCARDIOGRAM COMPLETE
Area-P 1/2: 2.73 cm2
S' Lateral: 2.7 cm

## 2021-07-06 NOTE — Telephone Encounter (Signed)
I spoke with daughter,Kelly. She agrees with plan to see Dr.Taylor in consult. Apt made for 08/10/21

## 2021-07-06 NOTE — Telephone Encounter (Signed)
Results discussed with patient. Will get BNP drawn at [facility]

## 2021-07-06 NOTE — Telephone Encounter (Addendum)
-----   Message from Sueanne Margarita, MD sent at 07/06/2021  1:21 PM EST ----- Please have her come in for a BMP    Sueanne Margarita, MD  07/06/2021  1:20 PM EST     2D echo showed low normal LV function with EF 50 to 55% with mild LVH and increased stiffness of the heart muscle.  There was trivial leakiness of the mitral and aortic valves

## 2021-07-06 NOTE — Progress Notes (Signed)
*  PRELIMINARY RESULTS* Echocardiogram 2D Echocardiogram has been performed.  Lisa Klein 07/06/2021, 12:13 PM

## 2021-07-07 ENCOUNTER — Other Ambulatory Visit (HOSPITAL_COMMUNITY)
Admission: RE | Admit: 2021-07-07 | Discharge: 2021-07-07 | Disposition: A | Discharge status: Home / Self Care | Payer: Medicare HMO | Source: Ambulatory Visit | Attending: Cardiology | Admitting: Cardiology

## 2021-07-07 ENCOUNTER — Ambulatory Visit (INDEPENDENT_AMBULATORY_CARE_PROVIDER_SITE_OTHER): Payer: Medicare HMO

## 2021-07-07 DIAGNOSIS — Z23 Encounter for immunization: Secondary | ICD-10-CM | POA: Diagnosis not present

## 2021-07-07 DIAGNOSIS — R0609 Other forms of dyspnea: Secondary | ICD-10-CM | POA: Insufficient documentation

## 2021-07-07 LAB — BRAIN NATRIURETIC PEPTIDE: B Natriuretic Peptide: 82 pg/mL (ref 0.0–100.0)

## 2021-07-17 DIAGNOSIS — H401232 Low-tension glaucoma, bilateral, moderate stage: Secondary | ICD-10-CM | POA: Diagnosis not present

## 2021-07-22 ENCOUNTER — Other Ambulatory Visit: Payer: Self-pay | Admitting: Family Medicine

## 2021-07-25 ENCOUNTER — Other Ambulatory Visit: Payer: Self-pay

## 2021-07-25 ENCOUNTER — Telehealth: Payer: Self-pay | Admitting: Family Medicine

## 2021-07-25 MED ORDER — PANTOPRAZOLE SODIUM 20 MG PO TBEC
20.0000 mg | DELAYED_RELEASE_TABLET | Freq: Every day | ORAL | 4 refills | Status: DC
Start: 2021-07-25 — End: 2021-09-13

## 2021-07-25 NOTE — Telephone Encounter (Signed)
[facility] called on pt behalf  Pt needs refill on   pantoprazole (PROTONIX) 20 MG tablet

## 2021-07-26 ENCOUNTER — Other Ambulatory Visit: Payer: Self-pay

## 2021-07-26 ENCOUNTER — Ambulatory Visit (INDEPENDENT_AMBULATORY_CARE_PROVIDER_SITE_OTHER): Payer: Medicare HMO | Admitting: Family Medicine

## 2021-07-26 ENCOUNTER — Encounter: Payer: Self-pay | Admitting: Family Medicine

## 2021-07-26 ENCOUNTER — Telehealth: Payer: Self-pay | Admitting: Family Medicine

## 2021-07-26 VITALS — BP 138/82 | HR 90 | Resp 16 | Ht 60.0 in | Wt 174.1 lb

## 2021-07-26 DIAGNOSIS — E7849 Other hyperlipidemia: Secondary | ICD-10-CM | POA: Diagnosis not present

## 2021-07-26 DIAGNOSIS — E039 Hypothyroidism, unspecified: Secondary | ICD-10-CM | POA: Diagnosis not present

## 2021-07-26 DIAGNOSIS — M159 Polyosteoarthritis, unspecified: Secondary | ICD-10-CM

## 2021-07-26 DIAGNOSIS — Z748 Other problems related to care provider dependency: Secondary | ICD-10-CM | POA: Diagnosis not present

## 2021-07-26 DIAGNOSIS — E6609 Other obesity due to excess calories: Secondary | ICD-10-CM | POA: Diagnosis not present

## 2021-07-26 DIAGNOSIS — I10 Essential (primary) hypertension: Secondary | ICD-10-CM

## 2021-07-26 DIAGNOSIS — Z6834 Body mass index (BMI) 34.0-34.9, adult: Secondary | ICD-10-CM | POA: Diagnosis not present

## 2021-07-26 DIAGNOSIS — Z139 Encounter for screening, unspecified: Secondary | ICD-10-CM | POA: Diagnosis not present

## 2021-07-26 NOTE — Telephone Encounter (Signed)
RETURN PHONE CALL

## 2021-07-26 NOTE — Patient Instructions (Addendum)
F/U in May, call if you need me sooner  You are referred to s/w for eval and assistance with transportation  Lipid, cmp and EGFR and TSH today  Be careful not to fall!!  Thanks for choosing [facility], we consider it a privelige to serve you.

## 2021-07-27 LAB — CMP14+EGFR
ALT: 12 IU/L (ref 0–32)
AST: 19 IU/L (ref 0–40)
Albumin/Globulin Ratio: 1.6 (ref 1.2–2.2)
Albumin: 4.3 g/dL (ref 3.6–4.6)
Alkaline Phosphatase: 39 IU/L — ABNORMAL LOW (ref 44–121)
BUN/Creatinine Ratio: 15 (ref 12–28)
BUN: 12 mg/dL (ref 8–27)
Bilirubin Total: 0.7 mg/dL (ref 0.0–1.2)
CO2: 25 mmol/L (ref 20–29)
Calcium: 9.7 mg/dL (ref 8.7–10.3)
Chloride: 102 mmol/L (ref 96–106)
Creatinine, Ser: 0.81 mg/dL (ref 0.57–1.00)
Globulin, Total: 2.7 g/dL (ref 1.5–4.5)
Glucose: 86 mg/dL (ref 70–99)
Potassium: 4.4 mmol/L (ref 3.5–5.2)
Sodium: 142 mmol/L (ref 134–144)
Total Protein: 7 g/dL (ref 6.0–8.5)
eGFR: 69 mL/min/{1.73_m2} (ref >59)

## 2021-07-27 LAB — LIPID PANEL
Chol/HDL Ratio: 2.7 ratio (ref 0.0–4.4)
Cholesterol, Total: 173 mg/dL (ref 100–199)
HDL: 65 mg/dL (ref >39)
LDL Chol Calc (NIH): 96 mg/dL (ref 0–99)
Triglycerides: 63 mg/dL (ref 0–149)
VLDL Cholesterol Cal: 12 mg/dL (ref 5–40)

## 2021-07-27 LAB — TSH: TSH: 3.06 u[IU]/mL (ref 0.450–4.500)

## 2021-08-01 ENCOUNTER — Encounter: Payer: Self-pay | Admitting: Family Medicine

## 2021-08-01 NOTE — Assessment & Plan Note (Signed)
Hyperlipidemia:Low fat diet discussed and encouraged.   Lipid Panel  Lab Results  Component Value Date   CHOL 173 07/26/2021   HDL 65 07/26/2021   LDLCALC 96 07/26/2021   TRIG 63 07/26/2021   CHOLHDL 2.7 07/26/2021     Controlled, no change in medication

## 2021-08-01 NOTE — Assessment & Plan Note (Signed)
Controlled, no change in medication  

## 2021-08-01 NOTE — Assessment & Plan Note (Signed)
°  Patient re-educated about  the importance of commitment to a  minimum of 150 minutes of exercise per week as able.  The importance of healthy food choices with portion control discussed, as well as eating regularly and within a 12 hour window most days. The need to choose "clean , green" food 50 to 75% of the time is discussed, as well as to make water the primary drink and set a goal of 64 ounces water daily.    Weight /BMI 07/26/2021 06/02/2021 03/22/2021  WEIGHT 174 lb 1.9 oz 178 lb 179 lb 1.3 oz  HEIGHT 5\' 0"  5\' 0"  5\' 4"   BMI 34.01 kg/m2 34.76 kg/m2 30.74 kg/m2

## 2021-08-01 NOTE — Progress Notes (Signed)
Lisa Klein     MRN: [id number]      DOB: 03/19/1930   HPI Lisa Klein is here for follow up and re-evaluation of chronic medical conditions, medication management and review of any available recent lab and radiology data.  Preventive health is updated, specifically  Cancer screening and Immunization.   Questions or concerns regarding consultations or procedures which the PT has had in the interim are  addressed. The PT denies any adverse reactions to current medications since the last visit.  C/o arthritic pains in hips and hands, denies falls but reports reduced mobility Has challenges with transportation to medical appts , still drives though really should not based on medical conditions ROS Denies recent fever or chills. Denies sinus pressure, nasal congestion, ear pain or sore throat. Denies chest congestion, productive cough or wheezing. Denies chest pains, palpitations and leg swelling Denies abdominal pain, nausea, vomiting,diarrhea or constipation.   Denies dysuria, frequency, hesitancy or incontinence. . Denies headaches, seizures, numbness, or tingling. Denies depression, anxiety or insomnia. Denies skin break down or rash.   PE  BP 138/82    Pulse 90    Resp 16    Ht 5' (1.524 m)    Wt 174 lb 1.9 oz (79 kg)    SpO2 96%    BMI 34.01 kg/m   Patient alert and oriented and in no cardiopulmonary distress.  HEENT: No facial asymmetry, EOMI,     Neck supple .  Chest: Clear to auscultation bilaterally.  CVS: S1, S2 no murmurs, no S3.Regular rate.  ABD: Soft non tender.   Ext: No edema  MS: markedly reduced  ROM spine, shoulders, hips and knees.  Skin: Intact, no ulcerations or rash noted.  Psych: Good eye contact, normal affect. Memory intact not anxious or depressed appearing.  CNS: CN 2-12 intact, power,  normal throughout.no focal deficits noted.   Assessment & Plan  Essential hypertension DASH diet and commitment to daily physical activity for a  minimum of 30 minutes discussed and encouraged, as a part of hypertension management. The importance of attaining a healthy weight is also discussed.  BP/Weight 07/26/2021 06/02/2021 03/22/2021 03/20/2021 10/27/2020 09/16/2020 07/37/1062  Systolic BP 694 854 627 035 009 381 -  Diastolic BP 82 78 75 60 76 72 -  Wt. (Lbs) 174.12 178 179.08 180.7 187 176.25 -  BMI 34.01 34.76 30.74 30.07 31.12 30.25 31.41       Generalized osteoarthritis of multiple sites Progressing , needs assistance with transport Home safety and fall risk reduction discussed  Hypothyroidism Controlled, no change in medication   Hyperlipemia Hyperlipidemia:Low fat diet discussed and encouraged.   Lipid Panel  Lab Results  Component Value Date   CHOL 173 07/26/2021   HDL 65 07/26/2021   LDLCALC 96 07/26/2021   TRIG 63 07/26/2021   CHOLHDL 2.7 07/26/2021     Controlled, no change in medication   Obesity  Patient re-educated about  the importance of commitment to a  minimum of 150 minutes of exercise per week as able.  The importance of healthy food choices with portion control discussed, as well as eating regularly and within a 12 hour window most days. The need to choose "clean , green" food 50 to 75% of the time is discussed, as well as to make water the primary drink and set a goal of 64 ounces water daily.    Weight /BMI 07/26/2021 06/02/2021 03/22/2021  WEIGHT 174 lb 1.9 oz 178 lb 179 lb 1.3 oz  HEIGHT 5\' 0"  5\' 0"  5\' 4"   BMI 34.01 kg/m2 34.76 kg/m2 30.74 kg/m2

## 2021-08-01 NOTE — Assessment & Plan Note (Signed)
DASH diet and commitment to daily physical activity for a minimum of 30 minutes discussed and encouraged, as a part of hypertension management. The importance of attaining a healthy weight is also discussed.  BP/Weight 07/26/2021 06/02/2021 03/22/2021 03/20/2021 10/27/2020 09/16/2020 95/18/8416  Systolic BP 606 301 601 093 235 573 -  Diastolic BP 82 78 75 60 76 72 -  Wt. (Lbs) 174.12 178 179.08 180.7 187 176.25 -  BMI 34.01 34.76 30.74 30.07 31.12 30.25 31.41

## 2021-08-01 NOTE — Assessment & Plan Note (Signed)
Progressing , needs assistance with transport Home safety and fall risk reduction discussed

## 2021-08-10 ENCOUNTER — Ambulatory Visit (INDEPENDENT_AMBULATORY_CARE_PROVIDER_SITE_OTHER): Payer: Medicare HMO | Admitting: Internal Medicine

## 2021-08-10 ENCOUNTER — Encounter: Payer: Self-pay | Admitting: Internal Medicine

## 2021-08-10 ENCOUNTER — Other Ambulatory Visit: Payer: Self-pay

## 2021-08-10 VITALS — BP 126/68 | HR 78 | Ht 64.0 in | Wt 179.0 lb

## 2021-08-10 DIAGNOSIS — I4729 Other ventricular tachycardia: Secondary | ICD-10-CM

## 2021-08-10 NOTE — Patient Instructions (Signed)
Medication Instructions:  °Your physician recommends that you continue on your current medications as directed. Please refer to the Current Medication list given to you today. ° °*If you need a refill on your cardiac medications before your next appointment, please call your pharmacy* ° ° °Lab Work: °NONE  ° °If you have labs (blood work) drawn today and your tests are completely normal, you will receive your results only by: °MyChart Message (if you have MyChart) OR °A paper copy in the mail °If you have any lab test that is abnormal or we need to change your treatment, we will call you to review the results. ° ° °Testing/Procedures: °NONE  ° ° °Follow-Up: °At [facility], you and your health needs are our priority.  As part of our continuing mission to provide you with exceptional heart care, we have created designated Provider Care Teams.  These Care Teams include your primary Cardiologist (physician) and Advanced Practice Providers (APPs -  Physician Assistants and Nurse Practitioners) who all work together to provide you with the care you need, when you need it. ° °We recommend signing up for the patient portal called "MyChart".  Sign up information is provided on this After Visit Summary.  MyChart is used to connect with patients for Virtual Visits (Telemedicine).  Patients are able to view lab/test results, encounter notes, upcoming appointments, etc.  Non-urgent messages can be sent to your provider as well.   °To learn more about what you can do with MyChart, go to [email or web address].   ° °Your next appointment:   ° As Needed  ° °The format for your next appointment:   °In Person ° °Provider:   °Gregg Taylor, MD  ° ° °Other Instructions °Thank you for choosing [facility]! °  ° ° °

## 2021-08-10 NOTE — Progress Notes (Signed)
HPI Ms. Kendra is referred by. Dr. Radford Pax for evaluation of palpitations associated with NSVT and PAF documented by cardiac monitor. She has not had chest pain and her symptoms are quite mild. She has preserved LV function. She has not had syncope. No edema. No significant pauses.  Allergies  Allergen Reactions   Listerine [Antiseptic Mouth Rinse]     Caused sore throat   Singulair [Montelukast Sodium] Other (See Comments)    Abdominal spasm      Current Outpatient Medications  Medication Sig Dispense Refill   acetaminophen (TYLENOL) 650 MG CR tablet Take 650 mg by mouth 2 (two) times daily.     anastrozole (ARIMIDEX) 1 MG tablet TAKE 1 TABLET AT BEDTIME 90 tablet 3   Biotin w/ Vitamins C & E (HAIR/SKIN/NAILS PO) Take by mouth.     calcium-vitamin D (OSCAL WITH D) 500-200 MG-UNIT tablet Take 1 tablet by mouth 2 (two) times daily.     Cod Liver Oil 1000 MG CAPS Take 1 capsule by mouth daily.      denosumab (PROLIA) 60 MG/ML SOLN injection Inject 60 mg into the skin every 6 (six) months. Administer in upper arm, thigh, or abdomen     docusate sodium (COLACE) 100 MG capsule Take 100 mg by mouth daily as needed for constipation. For constipation     hydrochlorothiazide (MICROZIDE) 12.5 MG capsule TAKE 1 CAPSULE EVERY MORNING 90 capsule 2   levothyroxine (SYNTHROID) 100 MCG tablet TAKE 1 TABLET EVERY MORNING 90 tablet 0   lovastatin (MEVACOR) 20 MG tablet TAKE 1 TABLET (20 MG TOTAL) BY MOUTH AT BEDTIME. 90 tablet 3   pantoprazole (PROTONIX) 20 MG tablet Take 1 tablet (20 mg total) by mouth daily. 30 tablet 4   potassium chloride (KLOR-CON) 10 MEQ tablet TAKE 1 TABLET (10 MEQ TOTAL) BY MOUTH 2 (TWO) TIMES DAILY. 180 tablet 1   pyridOXINE (VITAMIN B-6) 100 MG tablet Take 100 mg by mouth daily.     vitamin E 1000 UNIT capsule Take 1,000 Units by mouth daily.     XARELTO 20 MG TABS tablet TAKE 1 TABLET EVERY DAY WITH SUPPER. 90 tablet 6   No current facility-administered medications  for this visit.   Facility-Administered Medications Ordered in Other Visits  Medication Dose Route Frequency Provider Last Rate Last Admin   heparin lock flush 100 unit/mL  500 Units Intravenous Once Kefalas, Thomas S, PA-C       heparin lock flush 100 unit/mL  500 Units Intravenous Once Everardo All, MD       sodium chloride 0.9 % injection 10 mL  10 mL Intravenous PRN Kefalas, Thomas S, PA-C       sodium chloride 0.9 % injection 10 mL  10 mL Intravenous PRN Everardo All, MD         Past Medical History:  Diagnosis Date   Cancer ([place]) 2012   LEFT BREAST   Clotting disorder ([place]) 05/2011   dvt, right leg   Degenerative disc disease    with nerve compression    DJD (degenerative joint disease)    Hypertension    Hypertension    Hypothyroidism    Hypothyroidism    Long term current use of anticoagulant therapy 12/07/2014   Pulmonary embolus    Obesity    Osteopenia 08/24/2014    ROS:   All systems reviewed and negative except as noted in the HPI.   Past Surgical History:  Procedure Laterality Date   ABDOMINAL HYSTERECTOMY  1980   fibroids   BREAST SURGERY  2012   left total mastectomy   CHOLECYSTECTOMY  80'S   [facility]   CHOLECYSTECTOMY     HIP PINNING,CANNULATED Left 01/19/2013   Procedure: CANNULATED HIP PINNING-  left;  Surgeon: Marin Shutter, MD;  Location: [facility];  Service: Orthopedics;  Laterality: Left;   MASTECTOMY MODIFIED RADICAL  02/14/11   left   PORT-A-CATH REMOVAL N/A 12/05/2012   Procedure: MINOR REMOVAL PORT-A-CATH;  Surgeon: Jamesetta So, MD;  Location: [facility];  Service: General;  Laterality: N/A;  In Minor Room   PORTACATH PLACEMENT  04/16/2011   Procedure: INSERTION PORT-A-CATH;  Surgeon: Jamesetta So;  Location: [facility];  Service: General;  Laterality: Right;  right subclavian   VESICOVAGINAL FISTULA CLOSURE W/ TAH     [facility]     Family History  Problem Relation Age of Onset   COPD Father    Lung disease Father    Hypertension Sister     Arthritis Mother    Hypertension Sister    Heart disease Son        stent   Anesthesia problems Neg Hx    Malignant hyperthermia Neg Hx    Hypotension Neg Hx    Pseudochol deficiency Neg Hx      Social History   Socioeconomic History   Marital status: Widowed    Spouse name: Not on file   Number of children: 2   Years of education: 34   Highest education level: 12th grade  Occupational History   Occupation: [employer] part time    Occupation: retired/disabled   Tobacco Use   Smoking status: Never   Smokeless tobacco: Never  Substance and Sexual Activity   Alcohol use: No   Drug use: No   Sexual activity: Not Currently    Birth control/protection: Post-menopausal  Other Topics Concern   Not on file  Social History Narrative   Reports starting to have a lot of trouble doing things for herself. Lives alone, tries to drive to places, but it is getting increasingly harder to do.    Social Determinants of Health   Financial Resource Strain: Low Risk    Difficulty of Paying Living Expenses: Not very hard  Food Insecurity: No Food Insecurity   Worried About Charity fundraiser in the Last Year: Never true   Ran Out of Food in the Last Year: Never true  Transportation Needs: No Transportation Needs   Lack of Transportation (Medical): No   Lack of Transportation (Non-Medical): No  Physical Activity: Not on file  Stress: No Stress Concern Present   Feeling of Stress : Not at all  Social Connections: Moderately Isolated   Frequency of Communication with Friends and Family: Twice a week   Frequency of Social Gatherings with Friends and Family: Twice a week   Attends Religious Services: More than 4 times per year   Active Member of [employer] or Organizations: No   Attends Archivist Meetings: Never   Marital Status: Widowed  Human resources officer Violence: Not on file     BP 126/68    Pulse 78    Ht 5\' 4"  (1.626 m)    Wt 179 lb (81.2 kg)    SpO2 98%    BMI 30.73 kg/m    Physical Exam:  Well appearing NAD HEENT: Unremarkable Neck:  No JVD, no thyromegally Lymphatics:  No adenopathy Back:  No CVA tenderness Lungs:  Clear with no wheezes HEART:  Regular  rate rhythm, no murmurs, no rubs, no clicks Abd:  soft, positive bowel sounds, no organomegally, no rebound, no guarding Ext:  2 plus pulses, no edema, no cyanosis, no clubbing Skin:  No rashes no nodules Neuro:  CN II through XII intact, motor grossly intact   Cardiac monitor - reviewed  Assess/Plan:  Palpitations - her symptoms are fairly mild and self limited.  NSVT - I have discussed the benign nature of her symptoms. We discussed treatment options but I do not think the severity of her NSVT require an AA drug.  PAF - she is minimally symptomatic. We willow as needed. Agree with Xarelto.  Carleene Overlie Ciearra Rufo,MD

## 2021-08-18 ENCOUNTER — Other Ambulatory Visit: Payer: Self-pay

## 2021-08-18 ENCOUNTER — Ambulatory Visit (HOSPITAL_COMMUNITY)
Admission: RE | Admit: 2021-08-18 | Discharge: 2021-08-18 | Disposition: A | Discharge status: Home / Self Care | Payer: Medicare HMO | Source: Ambulatory Visit | Attending: Nurse Practitioner | Admitting: Nurse Practitioner

## 2021-08-18 DIAGNOSIS — Z1231 Encounter for screening mammogram for malignant neoplasm of breast: Secondary | ICD-10-CM | POA: Diagnosis not present

## 2021-08-18 DIAGNOSIS — C50912 Malignant neoplasm of unspecified site of left female breast: Secondary | ICD-10-CM | POA: Diagnosis not present

## 2021-08-21 ENCOUNTER — Telehealth: Payer: Self-pay

## 2021-08-21 NOTE — Telephone Encounter (Signed)
° °  Telephone encounter was:  Unsuccessful.  08/21/2021 Name: Lisa Klein MRN: [id number] DOB: 04/07/1930  Unsuccessful outbound call made today to assist with:  Transportation Needs   Outreach Attempt:  1st Attempt  Unable to leave pt a voicemail as mailbox was full.  [facility] management  [place], [place]  Main Phone: [phone]   E-mail: [email or web address]  Website: [email or web address]

## 2021-08-22 ENCOUNTER — Telehealth: Payer: Self-pay

## 2021-08-22 NOTE — Telephone Encounter (Signed)
° °  Telephone encounter was:  Unsuccessful.  08/22/2021 Name: Lisa Klein MRN: [id number] DOB: 12-05-1929  Unsuccessful outbound call made today to assist with:  Transportation Needs   Outreach Attempt:  2nd Attempt  A HIPAA compliant voice message was left requesting a return call.  Instructed patient to call back at [phone] at their earliest convenience.  [facility] management  [place], [place]  Main Phone: [phone]   E-mail: [email or web address]  Website: [email or web address]

## 2021-08-24 ENCOUNTER — Telehealth: Payer: Self-pay

## 2021-08-24 NOTE — Telephone Encounter (Signed)
° °  Telephone encounter was:  Successful.  08/24/2021 Name: Lisa Klein MRN: [id number] DOB: 1930/05/23  Lisa Klein is a 88 y.o. year old female who is a primary care patient of Moshe Cipro Norwood Levo, MD . The community resource team was consulted for assistance with Transportation Needs   Care guide performed the following interventions:  Pt and daughter called back in. Pt walks with a cane and needs assistance getting in and out of vehicle. Pt will be traveling alone. She is unsure if she has [employer] transportation benefit. I will call to see.  Pt and daughter advised at this time transportation is all they need.  Follow Up Plan:  Care guide will outreach resources to assist patient with transportation.  [facility] management  [place], [place]  Main Phone: [phone]   E-mail: [email or web address]  Website: [email or web address]

## 2021-09-04 ENCOUNTER — Telehealth: Payer: Self-pay

## 2021-09-04 NOTE — Telephone Encounter (Signed)
° °  Telephone encounter was:  Successful.  09/04/2021 Name: Lisa Klein MRN: [id number] DOB: 02/18/1930  Lisa Klein is a 88 y.o. year old female who is a primary care patient of [name], MD . The community resource team was consulted for assistance with Transportation Needs   Care guide performed the following interventions:  Transportation has been arranged for 2/9 and daughter has been educated on [employer] and scheduling rules. An e-mail of the transportation number and amount of rides have been sent to daughter as well.  For 09/14/2021 appointment: 11:45am - Pick from Home 1:00pm - [place] [id number] - Confirmation number  [phone] [employer] PPO (Book rides). [phone] Call back (Return Trip home phone number) or just call the main number to make it easier and they will direct you from there. You can press 1 for English and 0 for a live person if you do not want to go through the prompts.  Follow Up Plan:  No further follow up planned at this time. The patient has been provided with needed resources.  [facility] management  [place], [place]  Main Phone: [phone]   E-mail: [email or web address]  Website: [email or web address]

## 2021-09-13 ENCOUNTER — Other Ambulatory Visit (HOSPITAL_COMMUNITY): Payer: Self-pay

## 2021-09-13 ENCOUNTER — Other Ambulatory Visit: Payer: Self-pay | Admitting: Family Medicine

## 2021-09-13 ENCOUNTER — Other Ambulatory Visit (HOSPITAL_COMMUNITY): Payer: Self-pay | Admitting: Hematology

## 2021-09-13 DIAGNOSIS — Z7901 Long term (current) use of anticoagulants: Secondary | ICD-10-CM

## 2021-09-13 DIAGNOSIS — C50912 Malignant neoplasm of unspecified site of left female breast: Secondary | ICD-10-CM

## 2021-09-14 ENCOUNTER — Inpatient Hospital Stay (HOSPITAL_COMMUNITY): Payer: Medicare HMO | Attending: Hematology

## 2021-09-14 DIAGNOSIS — Z79899 Other long term (current) drug therapy: Secondary | ICD-10-CM | POA: Insufficient documentation

## 2021-09-14 DIAGNOSIS — Z8261 Family history of arthritis: Secondary | ICD-10-CM | POA: Insufficient documentation

## 2021-09-14 DIAGNOSIS — Z170 Estrogen receptor positive status [ER+]: Secondary | ICD-10-CM | POA: Insufficient documentation

## 2021-09-14 DIAGNOSIS — Z7982 Long term (current) use of aspirin: Secondary | ICD-10-CM | POA: Insufficient documentation

## 2021-09-14 DIAGNOSIS — Z8249 Family history of ischemic heart disease and other diseases of the circulatory system: Secondary | ICD-10-CM | POA: Insufficient documentation

## 2021-09-14 DIAGNOSIS — R6889 Other general symptoms and signs: Secondary | ICD-10-CM | POA: Diagnosis not present

## 2021-09-14 DIAGNOSIS — Z7901 Long term (current) use of anticoagulants: Secondary | ICD-10-CM | POA: Insufficient documentation

## 2021-09-14 DIAGNOSIS — C50912 Malignant neoplasm of unspecified site of left female breast: Secondary | ICD-10-CM | POA: Diagnosis not present

## 2021-09-14 DIAGNOSIS — Z79811 Long term (current) use of aromatase inhibitors: Secondary | ICD-10-CM | POA: Insufficient documentation

## 2021-09-14 DIAGNOSIS — M8580 Other specified disorders of bone density and structure, unspecified site: Secondary | ICD-10-CM | POA: Insufficient documentation

## 2021-09-14 DIAGNOSIS — Z923 Personal history of irradiation: Secondary | ICD-10-CM | POA: Insufficient documentation

## 2021-09-14 DIAGNOSIS — Z86718 Personal history of other venous thrombosis and embolism: Secondary | ICD-10-CM | POA: Insufficient documentation

## 2021-09-14 DIAGNOSIS — D72819 Decreased white blood cell count, unspecified: Secondary | ICD-10-CM | POA: Diagnosis not present

## 2021-09-14 DIAGNOSIS — M1990 Unspecified osteoarthritis, unspecified site: Secondary | ICD-10-CM | POA: Insufficient documentation

## 2021-09-14 DIAGNOSIS — Z86711 Personal history of pulmonary embolism: Secondary | ICD-10-CM | POA: Diagnosis not present

## 2021-09-14 LAB — COMPREHENSIVE METABOLIC PANEL
ALT: 13 U/L (ref 0–44)
AST: 20 U/L (ref 15–41)
Albumin: 3.7 g/dL (ref 3.5–5.0)
Alkaline Phosphatase: 35 U/L — ABNORMAL LOW (ref 38–126)
Anion gap: 3 — ABNORMAL LOW (ref 5–15)
BUN: 14 mg/dL (ref 8–23)
CO2: 29 mmol/L (ref 22–32)
Calcium: 9.3 mg/dL (ref 8.9–10.3)
Chloride: 107 mmol/L (ref 98–111)
Creatinine, Ser: 1.06 mg/dL — ABNORMAL HIGH (ref 0.44–1.00)
GFR, Estimated: 50 mL/min — ABNORMAL LOW (ref >60)
Glucose, Bld: 104 mg/dL — ABNORMAL HIGH (ref 70–99)
Potassium: 3.9 mmol/L (ref 3.5–5.1)
Sodium: 139 mmol/L (ref 135–145)
Total Bilirubin: 0.6 mg/dL (ref 0.3–1.2)
Total Protein: 6.7 g/dL (ref 6.5–8.1)

## 2021-09-14 LAB — VITAMIN D 25 HYDROXY (VIT D DEFICIENCY, FRACTURES): Vit D, 25-Hydroxy: 57.13 ng/mL (ref 30–100)

## 2021-09-14 LAB — CBC WITH DIFFERENTIAL/PLATELET
Abs Immature Granulocytes: 0.01 10*3/uL (ref 0.00–0.07)
Basophils Absolute: 0.1 10*3/uL (ref 0.0–0.1)
Basophils Relative: 2 %
Eosinophils Absolute: 0.3 10*3/uL (ref 0.0–0.5)
Eosinophils Relative: 8 %
HCT: 39.2 % (ref 36.0–46.0)
Hemoglobin: 12.2 g/dL (ref 12.0–15.0)
Immature Granulocytes: 0 %
Lymphocytes Relative: 33 %
Lymphs Abs: 1.3 10*3/uL (ref 0.7–4.0)
MCH: 28.4 pg (ref 26.0–34.0)
MCHC: 31.1 g/dL (ref 30.0–36.0)
MCV: 91.2 fL (ref 80.0–100.0)
Monocytes Absolute: 0.5 10*3/uL (ref 0.1–1.0)
Monocytes Relative: 13 %
Neutro Abs: 1.7 10*3/uL (ref 1.7–7.7)
Neutrophils Relative %: 44 %
Platelets: 160 10*3/uL (ref 150–400)
RBC: 4.3 MIL/uL (ref 3.87–5.11)
RDW: 14.7 % (ref 11.5–15.5)
WBC: 3.9 10*3/uL — ABNORMAL LOW (ref 4.0–10.5)
nRBC: 0 % (ref 0.0–0.2)

## 2021-09-14 LAB — VITAMIN B12: Vitamin B-12: 713 pg/mL (ref 180–914)

## 2021-09-14 LAB — LACTATE DEHYDROGENASE: LDH: 152 U/L (ref 98–192)

## 2021-09-14 LAB — FOLATE: Folate: 22 ng/mL (ref >5.9)

## 2021-09-21 ENCOUNTER — Inpatient Hospital Stay (HOSPITAL_COMMUNITY): Payer: Medicare HMO

## 2021-09-21 ENCOUNTER — Other Ambulatory Visit: Payer: Self-pay

## 2021-09-21 ENCOUNTER — Inpatient Hospital Stay (HOSPITAL_COMMUNITY): Payer: Medicare HMO | Admitting: Physician Assistant

## 2021-09-21 VITALS — BP 135/72 | HR 86 | Temp 98.4°F | Resp 19 | Wt 180.8 lb

## 2021-09-21 DIAGNOSIS — Z7901 Long term (current) use of anticoagulants: Secondary | ICD-10-CM | POA: Diagnosis not present

## 2021-09-21 DIAGNOSIS — M1990 Unspecified osteoarthritis, unspecified site: Secondary | ICD-10-CM | POA: Diagnosis not present

## 2021-09-21 DIAGNOSIS — Z86711 Personal history of pulmonary embolism: Secondary | ICD-10-CM | POA: Diagnosis not present

## 2021-09-21 DIAGNOSIS — Z79811 Long term (current) use of aromatase inhibitors: Secondary | ICD-10-CM | POA: Diagnosis not present

## 2021-09-21 DIAGNOSIS — M8580 Other specified disorders of bone density and structure, unspecified site: Secondary | ICD-10-CM

## 2021-09-21 DIAGNOSIS — Z7982 Long term (current) use of aspirin: Secondary | ICD-10-CM | POA: Diagnosis not present

## 2021-09-21 DIAGNOSIS — C50912 Malignant neoplasm of unspecified site of left female breast: Secondary | ICD-10-CM

## 2021-09-21 DIAGNOSIS — Z170 Estrogen receptor positive status [ER+]: Secondary | ICD-10-CM | POA: Diagnosis not present

## 2021-09-21 DIAGNOSIS — Z86718 Personal history of other venous thrombosis and embolism: Secondary | ICD-10-CM | POA: Diagnosis not present

## 2021-09-21 MED ORDER — DENOSUMAB 60 MG/ML ~~LOC~~ SOLN: 60.0000 mg | Freq: Once | SUBCUTANEOUS | Status: DC

## 2021-09-21 MED ORDER — DENOSUMAB 60 MG/ML ~~LOC~~ SOSY
60.0000 mg | PREFILLED_SYRINGE | Freq: Once | SUBCUTANEOUS | Status: AC
  Administered 2021-09-21: 60 mg via SUBCUTANEOUS
  Filled 2021-09-21: qty 1

## 2021-09-21 NOTE — Patient Instructions (Signed)
[facility] at [facility] Discharge Instructions  You were seen today by [name] PA-C for your history of breast cancer and blood clots.  Your most recent mammogram and physical exam did not show any signs of breast cancer coming back.  You have almost completed your 10 years of treatment with anastrozole (Arimidex).  You can [place] AFTER November 18, 2021.  You should continue to take your Xarelto for history of blood clots.  Let us know if you notice any major bleeding events.  You received Prolia today.  You will get your next Prolia injection at follow-up in 6 months.  LABS: Return in 6 months for repeat labs  FOLLOW-UP APPOINTMENT: Office visit in 6 months, after labs   Thank you for choosing [facility] at [facility] to provide your oncology and hematology care.  To afford each patient quality time with our provider, please arrive at least 15 minutes before your scheduled appointment time.   If you have a lab appointment with the [facility] please come in thru the Main Entrance and check in at the main information desk.  You need to re-schedule your appointment should you arrive 10 or more minutes late.  We strive to give you quality time with our providers, and arriving late affects you and other patients whose appointments are after yours.  Also, if you no show three or more times for appointments you may be dismissed from the clinic at the providers discretion.     Again, thank you for choosing [facility].  Our hope is that these requests will decrease the amount of time that you wait before being seen by our physicians.       _____________________________________________________________  Should you have questions after your visit to [facility], please contact our office at [phone] and follow the prompts.  Our office hours are 8:00 a.m. and 4:30 p.m. Monday - Friday.  Please note  that voicemails left after 4:00 p.m. may not be returned until the following business day.  We are closed weekends and major holidays.  You do have access to a nurse 24-7, just call the main number to the clinic [phone] and do not press any options, hold on the line and a nurse will answer the phone.    For prescription refill requests, have your pharmacy contact our office and allow 72 hours.    Due to Covid, you will need to wear a mask upon entering the hospital. If you do not have a mask, a mask will be given to you at the Main Entrance upon arrival. For doctor visits, patients may have 1 support person age 65 or older with them. For treatment visits, patients can not have anyone with them due to social distancing guidelines and our immunocompromised population.

## 2021-09-21 NOTE — Patient Instructions (Signed)
[facility]  Discharge Instructions: Thank you for choosing [facility] to provide your oncology and hematology care.  If you have a lab appointment with the Cancer Center, please come in thru the Main Entrance and check in at the main information desk.  Wear comfortable clothing and clothing appropriate for easy access to any Portacath or PICC line.   We strive to give you quality time with your provider. You may need to reschedule your appointment if you arrive late (15 or more minutes).  Arriving late affects you and other patients whose appointments are after yours.  Also, if you miss three or more appointments without notifying the office, you may be dismissed from the clinic at the provider's discretion.      For prescription refill requests, have your pharmacy contact our office and allow 72 hours for refills to be completed.        To help prevent nausea and vomiting after your treatment, we encourage you to take your nausea medication as directed.  BELOW ARE SYMPTOMS THAT SHOULD BE REPORTED IMMEDIATELY: *FEVER GREATER THAN 100.4 F (38 C) OR HIGHER *CHILLS OR SWEATING *NAUSEA AND VOMITING THAT IS NOT CONTROLLED WITH YOUR NAUSEA MEDICATION *UNUSUAL SHORTNESS OF BREATH *UNUSUAL BRUISING OR BLEEDING *URINARY PROBLEMS (pain or burning when urinating, or frequent urination) *BOWEL PROBLEMS (unusual diarrhea, constipation, pain near the anus) TENDERNESS IN MOUTH AND THROAT WITH OR WITHOUT PRESENCE OF ULCERS (sore throat, sores in mouth, or a toothache) UNUSUAL RASH, SWELLING OR PAIN  UNUSUAL VAGINAL DISCHARGE OR ITCHING   Items with * indicate a potential emergency and should be followed up as soon as possible or go to the Emergency Department if any problems should occur.  Please show the CHEMOTHERAPY ALERT CARD or IMMUNOTHERAPY ALERT CARD at check-in to the Emergency Department and triage nurse.  Should you have questions after your visit or need to cancel  or reschedule your appointment, please contact [facility] [phone]  and follow the prompts.  Office hours are 8:00 a.m. to 4:30 p.m. Monday - Friday. Please note that voicemails left after 4:00 p.m. may not be returned until the following business day.  We are closed weekends and major holidays. You have access to a nurse at all times for urgent questions. Please call the main number to the clinic [phone] and follow the prompts.  For any non-urgent questions, you may also contact your provider using MyChart. We now offer e-Visits for anyone 18 and older to request care online for non-urgent symptoms. For details visit [email or web address].   Also download the MyChart app! Go to the app store, search "MyChart", open the app, select [facility], and log in with your MyChart username and password.  Due to Covid, a mask is required upon entering the hospital/clinic. If you do not have a mask, one will be given to you upon arrival. For doctor visits, patients may have 1 support person aged 18 or older with them. For treatment visits, patients cannot have anyone with them due to current Covid guidelines and our immunocompromised population.  

## 2021-09-21 NOTE — Progress Notes (Signed)
Patient taking calcium as directed.  Denied tooth, jaw, and leg pain.  No recent or upcoming dental visits.  Labs reviewed.  Patient tolerated injection with no complaints voiced.  See MAR for details.  Patient stable during and after injection.  Site clean and dry with no bruising or swelling noted.  Band aid applied.  Vss with discharge and left in satisfactory condition with no s/s of distress noted.   

## 2021-09-21 NOTE — Progress Notes (Signed)
[place], [place] [address]   CLINIC:  Medical Oncology/Hematology  PCP:  [name], MD [address] [place] / [place] [address] [phone]   REASON FOR VISIT:  Follow-up for stage IIIa (T1 cN2 aM0) adenocarcinoma of the left breast s/p modified radical mastectomy  PRIOR THERAPY: - Modified radical mastectomy (02/14/2011) - Dose reduced epirubicin and Cytoxan x6 cycles - Radiation therapy  CURRENT THERAPY: Arimidex (since 11/19/2011)  BRIEF ONCOLOGIC HISTORY:  Oncology History   No history exists.    CANCER STAGING: Cancer Staging  Infiltrating ductal carcinoma of breast ([facility]) Staging form: Breast, AJCC 7th Edition - Clinical: Stage IIIA (T1c, N2a, cM0) - Signed by Baird Cancer, PA on 04/18/2011   INTERVAL HISTORY:  Lisa Klein, a 88 y.o. female, returns for routine follow-up of her left-sided breast cancer. Lisa Klein was last evaluated via telemedicine visit by NP Beckey Rutter on 03/20/2021.   At today's visit, she  reports feeling fairly well, with good days and bad days.  She denies any recent hospitalizations, surgeries, or changes in her  baseline health status.  Most recent right breast mammogram on 08/18/2021 showed no mammographic evidence of malignancy.  She denies any symptoms of recurrence such as new lumps, chest pain, dyspnea, or abdominal pain.  She has no new headaches, seizures, or focal neurologic deficits.   No B symptoms such as fever, chills, night sweats, unintentional weight loss. She has chronic arthritic pain, but denies any new bone pains.  She continues to take Arimidex and is tolerating it well without any hot flashes or night sweats.  She is tolerating Prolia well and denies any jaw pain.  She has not noted any new bone pain or fractures.    She is taking Xarelto as prescribed and tolerating well without any major bleeding events, but does not some occasional gum bleeding.    She denies any current symptoms of recurrent DVT or PE.  She reports 40% energy and 100% appetite.  She is maintaining stable weight at this time.   REVIEW OF SYSTEMS:  Review of Systems  Constitutional:  Positive for fatigue (chronic, at baseline). Negative for appetite change, chills, diaphoresis, fever and unexpected weight change.  HENT:   Negative for lump/mass and nosebleeds.   Eyes:  Negative for eye problems.  Respiratory:  Negative for cough, hemoptysis and shortness of breath.   Cardiovascular:  Negative for chest pain, leg swelling and palpitations.  Gastrointestinal:  Negative for abdominal pain, blood in stool, constipation, diarrhea, nausea and vomiting.  Genitourinary:  Positive for dysuria. Negative for hematuria.        Positive for urgency  Musculoskeletal:  Positive for arthralgias.  Skin: Negative.   Neurological:  Negative for dizziness, headaches and light-headedness.  Hematological:  Does not bruise/bleed easily.  Psychiatric/Behavioral:  Positive for sleep disturbance.     PAST MEDICAL/SURGICAL HISTORY:  Past Medical History:  Diagnosis Date   Cancer ([facility]) 2012   LEFT BREAST   Clotting disorder ([facility]) 05/2011   dvt, right leg   Degenerative disc disease    with nerve compression    DJD (degenerative joint disease)    Hypertension    Hypertension    Hypothyroidism    Hypothyroidism    Long term current use of anticoagulant therapy 12/07/2014   Pulmonary embolus    Obesity    Osteopenia 08/24/2014   Past Surgical History:  Procedure Laterality Date   ABDOMINAL HYSTERECTOMY  1980   fibroids   BREAST SURGERY  2012   left total mastectomy   CHOLECYSTECTOMY  80'S   [facility]   CHOLECYSTECTOMY     HIP PINNING,CANNULATED Left 01/19/2013   Procedure: CANNULATED HIP PINNING-  left;  Surgeon: Marin Shutter, MD;  Location: [facility];  Service: Orthopedics;  Laterality: Left;   MASTECTOMY MODIFIED RADICAL  02/14/11   left   PORT-A-CATH REMOVAL N/A 12/05/2012    Procedure: MINOR REMOVAL PORT-A-CATH;  Surgeon: Jamesetta So, MD;  Location: [facility];  Service: General;  Laterality: N/A;  In Minor Room   PORTACATH PLACEMENT  04/16/2011   Procedure: INSERTION PORT-A-CATH;  Surgeon: Jamesetta So;  Location: [facility];  Service: General;  Laterality: Right;  right subclavian   VESICOVAGINAL FISTULA CLOSURE W/ TAH     [facility]    SOCIAL HISTORY:  Social History   Socioeconomic History   Marital status: Widowed    Spouse name: Not on file   Number of children: 2   Years of education: 88   Highest education level: 12th grade  Occupational History   Occupation: [employer] part time    Occupation: retired/disabled   Tobacco Use   Smoking status: Never   Smokeless tobacco: Never  Substance and Sexual Activity   Alcohol use: No   Drug use: No   Sexual activity: Not Currently    Birth control/protection: Post-menopausal  Other Topics Concern   Not on file  Social History Narrative   Reports starting to have a lot of trouble doing things for herself. Lives alone, tries to drive to places, but it is getting increasingly harder to do.    Social Determinants of Health   Financial Resource Strain: Low Risk    Difficulty of Paying Living Expenses: Not very hard  Food Insecurity: No Food Insecurity   Worried About Charity fundraiser in the Last Year: Never true   Ran Out of Food in the Last Year: Never true  Transportation Needs: No Transportation Needs   Lack of Transportation (Medical): No   Lack of Transportation (Non-Medical): No  Physical Activity: Not on file  Stress: No Stress Concern Present   Feeling of Stress : Not at all  Social Connections: Moderately Isolated   Frequency of Communication with Friends and Family: Twice a week   Frequency of Social Gatherings with Friends and Family: Twice a week   Attends Religious Services: More than 4 times per year   Active Member of [employer] or Organizations: No   Attends Archivist Meetings: Never    Marital Status: Widowed  Human resources officer Violence: Not on file    FAMILY HISTORY:  Family History  Problem Relation Age of Onset   COPD Father    Lung disease Father    Hypertension Sister    Arthritis Mother    Hypertension Sister    Heart disease Son        stent   Anesthesia problems Neg Hx    Malignant hyperthermia Neg Hx    Hypotension Neg Hx    Pseudochol deficiency Neg Hx     CURRENT MEDICATIONS:  Current Outpatient Medications  Medication Sig Dispense Refill   acetaminophen (TYLENOL) 650 MG CR tablet Take 650 mg by mouth 2 (two) times daily.     anastrozole (ARIMIDEX) 1 MG tablet TAKE 1 TABLET AT BEDTIME 90 tablet 3   Biotin w/ Vitamins C & E (HAIR/SKIN/NAILS PO) Take by mouth.     calcium-vitamin D (OSCAL  WITH D) 500-200 MG-UNIT tablet Take 1 tablet by mouth 2 (two) times daily.     Cod Liver Oil 1000 MG CAPS Take 1 capsule by mouth daily.      denosumab (PROLIA) 60 MG/ML SOLN injection Inject 60 mg into the skin every 6 (six) months. Administer in upper arm, thigh, or abdomen     docusate sodium (COLACE) 100 MG capsule Take 100 mg by mouth daily as needed for constipation. For constipation     hydrochlorothiazide (MICROZIDE) 12.5 MG capsule TAKE 1 CAPSULE EVERY MORNING 90 capsule 2   levothyroxine (SYNTHROID) 100 MCG tablet TAKE 1 TABLET EVERY MORNING 90 tablet 0   lovastatin (MEVACOR) 20 MG tablet TAKE 1 TABLET (20 MG TOTAL) BY MOUTH AT BEDTIME. 90 tablet 3   pantoprazole (PROTONIX) 20 MG tablet TAKE 1 TABLET EVERY DAY 90 tablet 1   potassium chloride (KLOR-CON) 10 MEQ tablet TAKE 1 TABLET (10 MEQ TOTAL) BY MOUTH 2 (TWO) TIMES DAILY. 180 tablet 1   pyridOXINE (VITAMIN B-6) 100 MG tablet Take 100 mg by mouth daily.     vitamin E 1000 UNIT capsule Take 1,000 Units by mouth daily.     XARELTO 20 MG TABS tablet TAKE 1 TABLET EVERY DAY WITH SUPPER 90 tablet 6   No current facility-administered medications for this visit.   Facility-Administered Medications Ordered in  Other Visits  Medication Dose Route Frequency Provider Last Rate Last Admin   heparin lock flush 100 unit/mL  500 Units Intravenous Once Kefalas, Thomas S, PA-C       heparin lock flush 100 unit/mL  500 Units Intravenous Once Everardo All, MD       sodium chloride 0.9 % injection 10 mL  10 mL Intravenous PRN Kefalas, Thomas S, PA-C       sodium chloride 0.9 % injection 10 mL  10 mL Intravenous PRN Everardo All, MD        ALLERGIES:  Allergies  Allergen Reactions   Listerine [Antiseptic Mouth Rinse]     Caused sore throat   Singulair [Montelukast Sodium] Other (See Comments)    Abdominal spasm     PHYSICAL EXAM:  Performance status (ECOG): 2 - Symptomatic, <50% confined to bed  There were no vitals filed for this visit. Wt Readings from Last 3 Encounters:  08/10/21 179 lb (81.2 kg)  07/26/21 174 lb 1.9 oz (79 kg)  06/02/21 178 lb (80.7 kg)   Physical Exam Constitutional:      Appearance: Normal appearance. She is obese.  HENT:     Head: Normocephalic and atraumatic.     Mouth/Throat:     Mouth: Mucous membranes are moist.  Eyes:     Extraocular Movements: Extraocular movements intact.     Pupils: Pupils are equal, round, and reactive to light.  Cardiovascular:     Rate and Rhythm: Normal rate and regular rhythm.     Pulses: Normal pulses.     Heart sounds: Normal heart sounds.  Pulmonary:     Effort: Pulmonary effort is normal.     Breath sounds: Normal breath sounds.  Chest:  Breasts:    Right: Normal. No swelling, bleeding, inverted nipple, mass, nipple discharge, skin change or tenderness.     Left: Absent.    Abdominal:     General: Bowel sounds are normal.     Palpations: Abdomen is soft.     Tenderness: There is no abdominal tenderness.  Musculoskeletal:        General: No swelling.  Right lower leg: Edema (trace) present.     Left lower leg: Edema (trace) present.  Lymphadenopathy:     Cervical: No cervical adenopathy.     Upper Body:      Right upper body: No supraclavicular, axillary or pectoral adenopathy.     Left upper body: No supraclavicular, axillary or pectoral adenopathy.  Skin:    General: Skin is warm and dry.  Neurological:     General: No focal deficit present.     Mental Status: She is alert and oriented to person, place, and time.  Psychiatric:        Mood and Affect: Mood normal.        Behavior: Behavior normal.     LABORATORY DATA:  I have reviewed the labs as listed.  CBC Latest Ref Rng & Units 09/14/2021 03/20/2021 09/16/2020  WBC 4.0 - 10.5 K/uL 3.9(L) 3.9(L) 4.1  Hemoglobin 12.0 - 15.0 g/dL 12.2 12.8 12.7  Hematocrit 36.0 - 46.0 % 39.2 40.0 40.9  Platelets 150 - 400 K/uL 160 169 175   CMP Latest Ref Rng & Units 09/14/2021 07/26/2021 03/20/2021  Glucose 70 - 99 mg/dL 104(H) 86 109(H)  BUN 8 - 23 mg/dL _0 Creatinine 0.44 - 1.00 mg/dL 1.06(H) 0.81 0.92  Sodium 135 - 145 mmol/L 139 142 136  Potassium 3.5 - 5.1 mmol/L 3.9 4.4 3.7  Chloride 98 - 111 mmol/L 107 102 103  CO2 22 - 32 mmol/L _1 Calcium 8.9 - 10.3 mg/dL 9.3 9.7 9.2  Total Protein 6.5 - 8.1 g/dL 6.7 7.0 6.9  Total Bilirubin 0.3 - 1.2 mg/dL 0.6 0.7 0.8  Alkaline Phos 38 - 126 U/L 35(L) 39(L) 32(L)  AST 15 - 41 U/L _2 ALT 0 - 44 U/L _3 DIAGNOSTIC IMAGING:  I have independently reviewed the scans and discussed with the patient. No results found.   ASSESSMENT & PLAN: 1.  Left-sided breast cancer (stage IIIa T1 cN2 aM0 adenocarcinoma of the left breast) -Patient had grade 3 cancer with multifocal invasive disease.  Largest focus was 1.5 cm.  She did have involving the deep surgical margin of resection.  She had 6 of 7 positive nodes involved and axilla.  ER +100%, PR +14%, Ki-67 45%, HER2/neu nonamplified. - Modified radical mastectomy on 02/14/2011 - Received dose reduced epirubicin and Cytoxan x6 cycles - Completed radiation therapy - Last PET scan was NED in April 2014 - Has been on Arimidex since 11/19/2011,  goal 10 years of therapy - She is tolerating Arimidex well  - Most recent right breast mammogram (08/18/2021): BI-RADS Category 1, negative; no mammographic evidence of malignancy - Physical exam was negative for any discrete nodules or masses - She is clinically asymptomatic  - Most recent labs (09/14/2021) show CMP at baseline, unremarkable CBC - PLAN: We will continue follow-up visits every 6 months per patient preference. - Repeat labs, office visit, and physical exam in 6 months. - Next mammogram due in January 2024 - Patient instructed that she can STOP Arimidex as of 11/18/2021  2.  Osteopenia - Osteopenia in the setting of antiestrogen therapy - She was started on Prolia on 09/01/2014, and she has been tolerating it well  - She is taking calcium and vitamin D twice daily  - Vitamin D within target range. - Most recent DEXA scan (08/15/2020): T score -1.3 - No jaw pain.  Most recent calcium 9.3 (09/14/2021). - PLAN: Proceed with  Prolia today.  Continue Prolia every 6 months, but may consider transitioning to oral agent 1 year after discontinuation of anastrozole, pending results of next DEXA scan. - Next DEXA scan due January 2024.  3.  History of DVT and PE - Patient has had VTE x2 - Right lower extremity DVT in 2012 - Bilateral PE in 2014 - On lifelong anticoagulation - initially treated with Coumadin, but switched to [place] - She is tolerating Xarelto well without any bleeding events - She denies any current symptoms of recurrent DVT or PE - PLAN: Continue Xarelto indefinitely.  4.  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   PLAN SUMMARY & DISPOSITION: Repeat labs and RTC in 6 months Prolia injection in 6 months  All questions were answered. The patient knows to call the clinic with any problems, questions or concerns.  Medical decision making: Moderate  Time  spent on visit: I spent 20 minutes counseling the patient face to face. The total time spent in the appointment was 30 minutes and more than 50% was on counseling.   Harriett Rush, PA-C  09/21/2021 1:42 PM

## 2021-10-04 ENCOUNTER — Other Ambulatory Visit: Payer: Self-pay | Admitting: Family Medicine

## 2021-11-23 ENCOUNTER — Other Ambulatory Visit: Payer: Self-pay | Admitting: Family Medicine

## 2021-12-16 ENCOUNTER — Other Ambulatory Visit: Payer: Self-pay | Admitting: Family Medicine

## 2021-12-19 ENCOUNTER — Ambulatory Visit (INDEPENDENT_AMBULATORY_CARE_PROVIDER_SITE_OTHER): Payer: Medicare HMO | Admitting: Family Medicine

## 2021-12-19 ENCOUNTER — Encounter: Payer: Self-pay | Admitting: Orthopaedic Surgery

## 2021-12-19 ENCOUNTER — Telehealth: Payer: Self-pay | Admitting: Family Medicine

## 2021-12-19 ENCOUNTER — Encounter: Payer: Self-pay | Admitting: Family Medicine

## 2021-12-19 VITALS — BP 125/77 | HR 85 | Ht 64.0 in | Wt 182.0 lb

## 2021-12-19 DIAGNOSIS — E039 Hypothyroidism, unspecified: Secondary | ICD-10-CM

## 2021-12-19 DIAGNOSIS — M25511 Pain in right shoulder: Secondary | ICD-10-CM | POA: Insufficient documentation

## 2021-12-19 DIAGNOSIS — M25562 Pain in left knee: Secondary | ICD-10-CM

## 2021-12-19 DIAGNOSIS — I10 Essential (primary) hypertension: Secondary | ICD-10-CM

## 2021-12-19 DIAGNOSIS — E6609 Other obesity due to excess calories: Secondary | ICD-10-CM | POA: Diagnosis not present

## 2021-12-19 DIAGNOSIS — M25512 Pain in left shoulder: Secondary | ICD-10-CM

## 2021-12-19 DIAGNOSIS — E7849 Other hyperlipidemia: Secondary | ICD-10-CM | POA: Diagnosis not present

## 2021-12-19 DIAGNOSIS — R5383 Other fatigue: Secondary | ICD-10-CM

## 2021-12-19 DIAGNOSIS — G8929 Other chronic pain: Secondary | ICD-10-CM

## 2021-12-19 DIAGNOSIS — Z6834 Body mass index (BMI) 34.0-34.9, adult: Secondary | ICD-10-CM

## 2021-12-19 DIAGNOSIS — E66811 Obesity, class 1: Secondary | ICD-10-CM

## 2021-12-19 MED ORDER — CLOTRIMAZOLE-BETAMETHASONE 1-0.05 % EX CREA: 1.0000 "application " | TOPICAL_CREAM | Freq: Two times a day (BID) | CUTANEOUS | 1 refills | Status: DC

## 2021-12-19 NOTE — Assessment & Plan Note (Signed)
Controlled, no change in medication Updated lab needed at/ before next visit.  

## 2021-12-19 NOTE — Telephone Encounter (Signed)
Was just seen today for office visit- no calls to patient have been made since  ?

## 2021-12-19 NOTE — Telephone Encounter (Signed)
Pt returned call back? ?

## 2021-12-19 NOTE — Assessment & Plan Note (Signed)
Pain , stiffnerss and reduced shoulder mobility right greater than left, refer Ortho and pT twice weekly  For 4 weeks ?

## 2021-12-19 NOTE — Patient Instructions (Signed)
Annal exam with meds early September, call if you need me sooner ? ?You are referred to Orthopedics as well as yo out patient physical therapy because of arthritis in knees and shoulders ? ?Please DO get the 2nd shingrix vaccine ? ?You also need TDAP , do not get it the same day as the shingrix vaccine, wait about 2 to 3 weeks after your shingrix vaccine ? ?Fasting lipid,cmp and eGR, tSH and vit D 1 week before your September visit. ? ?I do hope as the weather warms and you get help for your joints, you will feel better ? ?Careful not to fall ? ?Thanks for choosing [facility], we consider it a privelige to serve you. ? ? ? ? ?

## 2021-12-19 NOTE — Assessment & Plan Note (Signed)
Hyperlipidemia:Low fat diet discussed and encouraged. ? ? ?Lipid Panel  ?Lab Results  ?Component Value Date  ? CHOL 173 07/26/2021  ? HDL 65 07/26/2021  ? [place] 96 07/26/2021  ? TRIG 63 07/26/2021  ? CHOLHDL 2.7 07/26/2021  ? ? ? ?Controlled, no change in medication ?Updated lab needed at/ before next visit. ? ?

## 2021-12-19 NOTE — Assessment & Plan Note (Signed)
DASH diet and commitment to daily physical activity for a minimum of 30 minutes discussed and encouraged, as a part of hypertension management. ?The importance of attaining a healthy weight is also discussed. ?Controlled, no change in medication ? ? ? ?  12/19/2021  ?  1:26 PM 09/21/2021  ? 12:59 PM 08/10/2021  ?  9:38 AM 07/26/2021  ? 10:31 AM 06/02/2021  ?  2:28 PM 03/22/2021  ?  9:53 AM 03/20/2021  ?  2:21 PM  ?BP/Weight  ?Systolic BP 401 027 253 664 135 145 142  ?Diastolic BP 77 72 68 82 78 75 60  ?Wt. (Lbs) 182 180.78 179 174.12 178 179.08 180.7  ?BMI 31.24 kg/m2 31.03 kg/m2 30.73 kg/m2 34.01 kg/m2 34.76 kg/m2 30.74 kg/m2 30.07 kg/m2  ? ? ? ? ?

## 2021-12-19 NOTE — Assessment & Plan Note (Signed)
?  Patient re-educated about  the importance of commitment to a  minimum of 150 minutes of exercise per week as able. ? ?The importance of healthy food choices with portion control discussed, as well as eating regularly and within a 12 hour window most days. ?The need to choose "clean , green" food 50 to 75% of the time is discussed, as well as to make water the primary drink and set a goal of 64 ounces water daily. ? ?  ? ?  12/19/2021  ?  1:26 PM 09/21/2021  ? 12:59 PM 08/10/2021  ?  9:38 AM  ?Weight /BMI  ?Weight 182 lb 180 lb 12.4 oz 179 lb  ?Height '5\' 4"'$  (1.626 m)  '5\' 4"'$  (1.626 m)  ?BMI 31.24 kg/m2 31.03 kg/m2 30.73 kg/m2  ? ? ? ?

## 2021-12-19 NOTE — Assessment & Plan Note (Signed)
Increased pain , stiffness and instability refer ortho and pT twice weekly for 6 weeks ?

## 2021-12-19 NOTE — Progress Notes (Signed)
? ?Lisa Klein     MRN: [id number]      DOB: [birth year] ? ? ?HPI ?[name] is here for follow up and re-evaluation of chronic medical conditions, medication management and review of any available recent lab and radiology data.  ?Preventive health is updated, specifically  Cancer screening and Immunization.   ?Questions or concerns regarding consultations or procedures which the PT has had in the interim are  addressed. ?The PT denies any adverse reactions to current medications since the last visit.  ?C/o worsening knee and shoulder pain with stiffness  ?C/o itchy rash on left upper back ? ?ROS ?Denies recent fever or chills. ?Denies sinus pressure, nasal congestion, ear pain or sore throat. ?Denies chest congestion, productive cough or wheezing. ?Denies chest pains, palpitations and leg swelling ?Denies abdominal pain, nausea, vomiting,diarrhea or constipation.   ?Denies dysuria, frequency, hesitancy or incontinence. ? ?Denies headaches, seizures, numbness, or tingling. ?Denies depression, anxiety or insomnia. ?. ? ? ?PE ? ?BP 125/77   Pulse 85   Ht '5\' 4"'$  (1.626 m)   Wt 182 lb (82.6 kg)   SpO2 96%   BMI 31.24 kg/m?  ? ?Patient alert and oriented and in no cardiopulmonary distress. ? ?HEENT: No facial asymmetry, EOMI,     Neck supple . ? ?Chest: Clear to auscultation bilaterally. ? ?CVS: S1, S2 no murmurs, no S3.Regular rate. ? ?ABD: Soft non tender.  ? ?Ext: No edema ? ?MS: decreased  ROM spine, shoulders,  and knees. ? ?Skin: Intact, fungal rash in left upper back ?Psych: Good eye contact, normal affect. Memory intact not anxious or depressed appearing. ? ?CNS: CN 2-12 intact, power,  normal throughout.no focal deficits noted. ? ? ?Assessment & Plan ? ?Knee pain, left ?Increased pain , stiffness and instability refer ortho and pT twice weekly for 6 weeks ? ?Bilateral shoulder pain ?Pain , stiffnerss and reduced shoulder mobility right greater than left, refer Ortho and pT twice weekly  For 4  weeks ? ?Essential hypertension ?DASH diet and commitment to daily physical activity for a minimum of 30 minutes discussed and encouraged, as a part of hypertension management. ?The importance of attaining a healthy weight is also discussed. ?Controlled, no change in medication ? ? ? ?  12/19/2021  ?  1:26 PM 09/21/2021  ? 12:59 PM 08/10/2021  ?  9:38 AM 07/26/2021  ? 10:31 AM 06/02/2021  ?  2:28 PM 03/22/2021  ?  9:53 AM 03/20/2021  ?  2:21 PM  ?BP/Weight  ?Systolic BP 397 673 419 379 135 145 142  ?Diastolic BP 77 72 68 82 78 75 60  ?Wt. (Lbs) 182 180.78 179 174.12 178 179.08 180.7  ?BMI 31.24 kg/m2 31.03 kg/m2 30.73 kg/m2 34.01 kg/m2 34.76 kg/m2 30.74 kg/m2 30.07 kg/m2  ? ? ? ? ? ?Hyperlipemia ?Hyperlipidemia:Low fat diet discussed and encouraged. ? ? ?Lipid Panel  ?Lab Results  ?Component Value Date  ? CHOL 173 07/26/2021  ? HDL 65 07/26/2021  ? [place] 96 07/26/2021  ? TRIG 63 07/26/2021  ? CHOLHDL 2.7 07/26/2021  ? ? ? ?Controlled, no change in medication ?Updated lab needed at/ before next visit. ? ? ?Hypothyroidism ?Controlled, no change in medication ?Updated lab needed at/ before next visit. ? ? ?Obesity ? ?Patient re-educated about  the importance of commitment to a  minimum of 150 minutes of exercise per week as able. ? ?The importance of healthy food choices with portion control discussed, as well as eating regularly and within a 12  hour window most days. ?The need to choose "clean , green" food 50 to 75% of the time is discussed, as well as to make water the primary drink and set a goal of 64 ounces water daily. ? ?  ? ?  12/19/2021  ?  1:26 PM 09/21/2021  ? 12:59 PM 08/10/2021  ?  9:38 AM  ?Weight /BMI  ?Weight 182 lb 180 lb 12.4 oz 179 lb  ?Height '5\' 4"'$  (1.626 m)  '5\' 4"'$  (1.626 m)  ?BMI 31.24 kg/m2 31.03 kg/m2 30.73 kg/m2  ? ? ? ? ?

## 2021-12-21 DIAGNOSIS — H2513 Age-related nuclear cataract, bilateral: Secondary | ICD-10-CM | POA: Diagnosis not present

## 2021-12-21 DIAGNOSIS — H04123 Dry eye syndrome of bilateral lacrimal glands: Secondary | ICD-10-CM | POA: Diagnosis not present

## 2021-12-21 DIAGNOSIS — H401232 Low-tension glaucoma, bilateral, moderate stage: Secondary | ICD-10-CM | POA: Diagnosis not present

## 2021-12-21 DIAGNOSIS — H35033 Hypertensive retinopathy, bilateral: Secondary | ICD-10-CM | POA: Diagnosis not present

## 2022-01-12 ENCOUNTER — Encounter (HOSPITAL_COMMUNITY): Payer: Self-pay

## 2022-01-12 ENCOUNTER — Ambulatory Visit (HOSPITAL_COMMUNITY): Payer: Medicare HMO | Attending: Family Medicine

## 2022-01-12 DIAGNOSIS — M25562 Pain in left knee: Secondary | ICD-10-CM | POA: Insufficient documentation

## 2022-01-12 DIAGNOSIS — G8929 Other chronic pain: Secondary | ICD-10-CM | POA: Insufficient documentation

## 2022-01-12 DIAGNOSIS — R2689 Other abnormalities of gait and mobility: Secondary | ICD-10-CM | POA: Diagnosis not present

## 2022-01-12 DIAGNOSIS — M25511 Pain in right shoulder: Secondary | ICD-10-CM | POA: Insufficient documentation

## 2022-01-12 DIAGNOSIS — M6281 Muscle weakness (generalized): Secondary | ICD-10-CM | POA: Insufficient documentation

## 2022-01-12 DIAGNOSIS — M25512 Pain in left shoulder: Secondary | ICD-10-CM | POA: Insufficient documentation

## 2022-01-12 NOTE — Therapy (Signed)
OUTPATIENT PHYSICAL THERAPY LOWER EXTREMITY EVALUATION   Patient Name: CZARINA GINGRAS MRN: [id number] DOB:Apr 08, 1930, 88 y.o., female Today's Date: 01/12/2022   PT End of Session - 01/12/22 1119     Visit Number 1    Number of Visits 16    Date for PT Re-Evaluation 03/09/22    Authorization Type [employer] H[employer] (auth required, no visit limit based on med necessity) (please check)    PT Start Time 1120    PT Stop Time 1200    PT Time Calculation (min) 40 min             Past Medical History:  Diagnosis Date   Cancer ([place]) 2012   LEFT BREAST   Clotting disorder ([place]) 05/2011   dvt, right leg   Degenerative disc disease    with nerve compression    DJD (degenerative joint disease)    Hypertension    Hypertension    Hypothyroidism    Hypothyroidism    Long term current use of anticoagulant therapy 12/07/2014   Pulmonary embolus    Obesity    Osteopenia 08/24/2014   Past Surgical History:  Procedure Laterality Date   ABDOMINAL HYSTERECTOMY  1980   fibroids   BREAST SURGERY  2012   left total mastectomy   CHOLECYSTECTOMY  80'S   [facility]   CHOLECYSTECTOMY     HIP PINNING,CANNULATED Left 01/19/2013   Procedure: CANNULATED HIP PINNING-  left;  Surgeon: Marin Shutter, MD;  Location: [facility];  Service: Orthopedics;  Laterality: Left;   MASTECTOMY MODIFIED RADICAL  02/14/11   left   PORT-A-CATH REMOVAL N/A 12/05/2012   Procedure: MINOR REMOVAL PORT-A-CATH;  Surgeon: Jamesetta So, MD;  Location: [facility];  Service: General;  Laterality: N/A;  In Minor Room   PORTACATH PLACEMENT  04/16/2011   Procedure: INSERTION PORT-A-CATH;  Surgeon: Jamesetta So;  Location: [facility];  Service: General;  Laterality: Right;  right subclavian   VESICOVAGINAL FISTULA CLOSURE W/ TAH     [facility]   Patient Active Problem List   Diagnosis Date Noted   Bilateral shoulder pain 12/19/2021   Fatigue 03/27/2021   Shortness of breath 03/27/2021   GERD (gastroesophageal reflux disease) 03/27/2021   Knee  pain, left 02/15/2020   Generalized osteoarthritis of multiple sites 04/02/2018   Allergic rhinitis 09/01/2015   Obesity 12/13/2014   Long term current use of anticoagulant therapy 12/07/2014   Osteopenia 08/24/2014   Infiltrating ductal carcinoma of breast ([place]) 04/10/2011   Hyperlipemia 11/07/2007   Hypothyroidism 08/15/2007   Essential hypertension 08/15/2007    PCP: [name] MD  REFERRING PROVIDER: [name] MD  REFERRING DIAG: M25.511,G89.29,M25.512 (ICD-10-CM) - Chronic pain of both shoulders M25.562,G89.29 (ICD-10-CM) - Chronic pain of left knee   THERAPY DIAG:  Chronic pain of left knee  Chronic right shoulder pain  Muscle weakness (generalized)  Other abnormalities of gait and mobility  Rationale for Evaluation and Treatment Rehabilitation  ONSET DATE: approximately 2013  SUBJECTIVE:   SUBJECTIVE STATEMENT: MD referred pt over as she thought it would help her leg. Patient is having left knee pain, pt has had knee pain for many years (10 years ago started), has had an injection at one time but did not find relief. Pt reports that she has had history of shoulder soreness because of arthritis. Pt reports having shingles shot in April 2023 and since then she can not lift her right arm. Patient reports that prior to having shot she could not lift  her left arm, now since right arm has been bad she has using left more and can now lift left arm better but reports its still limited.   PERTINENT HISTORY:  Right shoulder soreness and limited AROM, left arm soreness but more ROM  Left knee pain with genu valgum  Severe arthritis There is severe tricompartmental degenerative joint disease of the left knee with marked bone on bone and osteophytes more laterally and knock-knee deformity present.  No fracture or loose body noted.  Bone quality is good.  Breast cancer in 2012  Knee pain, left Increased pain , stiffness and instability    Bilateral shoulder  pain Pain , stiffness and reduced shoulder mobility right greater than left  History of left hip fx with orif       PAIN:  Are you having pain? Yes: NPRS scale: Right shoulder 5/10 knee at worst 10/10 currently 3 /10 Pain location: right shoulder and left knee Pain description: shoulder aching. Knee aching Aggravating factors: standing and getting up or using arm in adls Relieving factors: massaging area  PRECAUTIONS: None  WEIGHT BEARING RESTRICTIONS No  FALLS:  Has patient fallen in last 6 months? No and patient states she has had near falls but catches herself with hands on wall etc.    LIVING ENVIRONMENT: Lives with: lives alone Lives in: House/apartment Stairs:  stairs outside to basement and on back deck but hasn't done them in 15 years.  Also has attic in home but again has not done in many years.  Has following equipment at home: Quad cane small base, Environmental consultant - 2 wheeled, Environmental consultant - 4 wheeled, shower chair, and Grab bars  OCCUPATION: retired  PLOF: Independent with household mobility with device and Independent with homemaking with ambulation  PATIENT GOALS   -Primary improve strength of knee, try to decrease pain, more steady on feet.  -Secondary improve right shoulder strength, improve range of right shoulder, and decrease pain.    OBJECTIVE:   DIAGNOSTIC FINDINGS:  Impression:  Severe tricompartmental degenerative joint disease with marked bone on bone laterally  PATIENT SURVEYS:  FOTO 50  COGNITION:  Overall cognitive status: Within functional limits for tasks assessed     SENSATION: WFL  EDEMA:  In left knee; medial and proximal to patella    POSTURE: rounded shoulders, forward head, and increased thoracic kyphosis    LOWER EXTREMITY MMT:  MMT Right eval Left eval  Hip flexion 4 4  Hip extension 3 3  Hip abduction    Hip adduction    Hip internal rotation    Hip external rotation    Knee flexion    Knee extension 3 (pts knee gives in  pain with cracking with minimal pressure) 3-  Ankle dorsiflexion 3 with minimal pressure knees start cracking) 3(with minimal pressure knees start cracking)  Ankle plantarflexion    Ankle inversion 3 3  Ankle eversion 3    (Blank rows = not tested)  LOWER EXTREMITY ROM:  AROM Right eval Left eval  Hip flexion    Hip extension    Hip abduction    Hip adduction    Hip internal rotation    Hip external rotation    Knee flexion 80   Knee extension -20   Ankle dorsiflexion    Ankle plantarflexion    Ankle inversion    Ankle eversion     (Blank rows = not tested)   UPPER EXTREMITY ROM:  Active ROM Right eval Left eval  Shoulder flexion  50 Arom 120 degrees and then start of pain   Shoulder extension    Shoulder abduction 60 degrees AROM  130 PROM   Shoulder adduction    Shoulder extension    Shoulder internal rotation    Shoulder external rotation    Elbow flexion    Elbow extension    Wrist flexion    Wrist extension    Wrist ulnar deviation    Wrist radial deviation    Wrist pronation    Wrist supination     (Blank rows = not tested)  UPPER EXTREMITY MMT:  MMT Right eval Left eval  Shoulder flexion 3-   Shoulder extension    Shoulder abduction 3-   Shoulder adduction    Shoulder extension    Shoulder internal rotation    Shoulder external rotation    Middle trapezius    Lower trapezius    Elbow flexion    Elbow extension 3+   Wrist flexion 3+   Wrist extension    Wrist ulnar deviation    Wrist radial deviation    Wrist pronation    Wrist supination    Grip strength     (Blank rows = not tested)   FUNCTIONAL TESTS:  5 times sit to stand: 80.46 seconds with b/l UE support Timed up and go (TUG): to Left 60.79 seconds with NBQC  GAIT: Distance walked: 100' Assistive device utilized: Lobbyist Level of assistance: SBA and CGA Comments: increwased trunk flexion, decreased cadence, short step length, poor placement of cane on  ground, decreased weightbearing on left LE  Sit to stand  B/L ue use at Close supervision, pt with large BOS and must come up in increments, does not get knees flexed enough   TODAY'S TREATMENT: evaluation   PATIENT EDUCATION:  Education details: Patient educated on exam findings, POC, scope of PT. Person educated: Patient Education method: Explanation, Demonstration, and Handouts Education comprehension: verbalized understanding, returned demonstration, verbal cues required, and tactile cues required    HOME EXERCISE PROGRAM: Initiate next session  ASSESSMENT:  CLINICAL IMPRESSION: Patient a 88 y.o. y.o. female who was seen today for physical therapy evaluation and treatment for left knee pain and bilateral shoulder pain.  Pt with increased difficulty raising left arm and making functional use of arm in past 4-5 months. Patients knee has gradually become more unstable over past 10 years. Patient with increased left knee swelling and genu valgum. Patient with impaired strength throughout Ues, Les and trunk/core. Upon mmt testing with any LE resistance pt gets pain and has joint instability. Patients let shoulder rom is limited to less than half of normal range. Patient is unable to extend trunk and stand upright. Patient in limited in ability to stand, walk, transfer, perform adls and house hold chores.  Patient will benefit from skilled PT services to improve UE rom, strength, trunk and core strength, left knee strength and stability all to improve functional mobility, decrease pain and decrease risk of falls.    OBJECTIVE IMPAIRMENTS Abnormal gait, decreased activity tolerance, decreased balance, decreased endurance, decreased knowledge of use of DME, decreased mobility, difficulty walking, decreased ROM, decreased strength, increased edema, increased fascial restrictions, impaired flexibility, impaired UE functional use, postural dysfunction, and pain.   ACTIVITY LIMITATIONS carrying,  lifting, bending, standing, squatting, stairs, transfers, bed mobility, bathing, toileting, dressing, reach over head, and locomotion level  PARTICIPATION LIMITATIONS: cleaning, laundry, shopping, community activity, and yard work  PERSONAL FACTORS Age and 3+ comorbidities:  Allergies, Arthritis, Back pain, BMI over 30, Cancer, Hearing Impairment, High Blood Pressure, Osteoporosis, Prior Surgeryare also affecting patient's functional outcome.   REHAB POTENTIAL: Fair chronicity of impairment, multi areas affected  CLINICAL DECISION MAKING: Stable/uncomplicated  EVALUATION COMPLEXITY: High   GOALS: Goals reviewed with patient? Yes  SHORT TERM GOALS: Target date: 02/09/2022  Patient will be independent with HEP in order to improve functional outcomes. Baseline:  Goal status: INITIAL  2.  Patient will report at least 25% improvement in symptoms for improved quality of life. Baseline:  Goal status: INITIAL  3.  Patient will improve five time sit to stand by 20 seconds to work towards improved efficiency in transfer and demonstrate improving LE strength. Baseline: 5 times sit to stand: 80.46 seconds with b/l UE support Goal status: INITIAL  4.  Pt will improve TUG by 10 seconds to demonstrate improving efficiency in ambulation Baseline:  Timed up and go (TUG): to Left 60.79 seconds with NBQC Goal status: INITIAL  5.  Pt will improve right shoulder arom to 70 degrees to work towards improved functional use in adls Baseline: 50 degrees Goal status: INITIAL    LONG TERM GOALS: Target date: 03/09/2022  2.  Patient will improve FOTO score by at least 10 points in order to indicate improved tolerance to activity. Baseline: 50 Goal status: INITIAL 2. Patient will improve five time sit to stand by 40 seconds to work towards improved efficiency in transfer and demonstrate improving LE strength. Baseline: 5 times sit to stand: 80.46 seconds with b/l UE support Goal status:  INITIAL  4.  Pt will improve TUG by 20 seconds to demonstrate improving efficiency in ambulation Baseline:  Timed up and go (TUG): to Left 60.79 seconds with NBQC Goal status: INITIAL  5.  Pt will improve right shoulder arom to 90 degrees to work towards improved functional use in adls Baseline: 50 degrees Goal status: INITIAL    PLAN: PT FREQUENCY: 2x/week  PT DURATION: 8 weeks  PLANNED INTERVENTIONS: PLANNED INTERVENTIONS: Therapeutic exercises, Therapeutic activity, Neuromuscular re-education, Balance training, Gait training, Patient/Family education, Joint manipulation, Joint mobilization, Stair training, Orthotic/Fit training, DME instructions, Aquatic Therapy, Dry Needling, Electrical stimulation, Spinal manipulation, Spinal mobilization, Cryotherapy, Moist heat, Compression bandaging, scar mobilization, Splintting, Taping, Traction, Ultrasound, Ionotophoresis '4mg'$ /ml Dexamethasone, and Manual therapy  PLAN FOR NEXT SESSION: HEP for shoulder R shoulder ROM and strength. HEP for Left hip and knee strength.    Lamaj Metoyer, PT 01/12/2022, 12:53 PM

## 2022-01-17 ENCOUNTER — Ambulatory Visit (HOSPITAL_COMMUNITY): Payer: Medicare HMO | Attending: Family Medicine | Admitting: Physical Therapy

## 2022-01-17 DIAGNOSIS — M25512 Pain in left shoulder: Secondary | ICD-10-CM | POA: Diagnosis not present

## 2022-01-17 DIAGNOSIS — R2689 Other abnormalities of gait and mobility: Secondary | ICD-10-CM | POA: Insufficient documentation

## 2022-01-17 DIAGNOSIS — M25511 Pain in right shoulder: Secondary | ICD-10-CM | POA: Diagnosis not present

## 2022-01-17 DIAGNOSIS — M6281 Muscle weakness (generalized): Secondary | ICD-10-CM | POA: Insufficient documentation

## 2022-01-17 DIAGNOSIS — M25562 Pain in left knee: Secondary | ICD-10-CM | POA: Insufficient documentation

## 2022-01-17 DIAGNOSIS — G8929 Other chronic pain: Secondary | ICD-10-CM | POA: Insufficient documentation

## 2022-01-17 NOTE — Therapy (Signed)
OUTPATIENT PHYSICAL THERAPY TREATMENT  Patient Name: Lisa KEEVEN MRN: [id number] DOB:04-18-1930, 88 y.o., female Today's Date: 01/17/2022   PT End of Session - 01/17/22 1141     Visit Number 2    Number of Visits 16    Date for PT Re-Evaluation 03/09/22    Authorization Type [employer] H[employer] (auth required, no visit limit based on med necessity) (please check)    PT Start Time 28    PT Stop Time 1215    PT Time Calculation (min) 40 min             Past Medical History:  Diagnosis Date   Cancer ([place]) 2012   LEFT BREAST   Clotting disorder ([place]) 05/2011   dvt, right leg   Degenerative disc disease    with nerve compression    DJD (degenerative joint disease)    Hypertension    Hypertension    Hypothyroidism    Hypothyroidism    Long term current use of anticoagulant therapy 12/07/2014   Pulmonary embolus    Obesity    Osteopenia 08/24/2014   Past Surgical History:  Procedure Laterality Date   ABDOMINAL HYSTERECTOMY  1980   fibroids   BREAST SURGERY  2012   left total mastectomy   CHOLECYSTECTOMY  80'S   [facility]   CHOLECYSTECTOMY     HIP PINNING,CANNULATED Left 01/19/2013   Procedure: CANNULATED HIP PINNING-  left;  Surgeon: Marin Shutter, MD;  Location: [facility];  Service: Orthopedics;  Laterality: Left;   MASTECTOMY MODIFIED RADICAL  02/14/11   left   PORT-A-CATH REMOVAL N/A 12/05/2012   Procedure: MINOR REMOVAL PORT-A-CATH;  Surgeon: Jamesetta So, MD;  Location: [facility];  Service: General;  Laterality: N/A;  In Minor Room   PORTACATH PLACEMENT  04/16/2011   Procedure: INSERTION PORT-A-CATH;  Surgeon: Jamesetta So;  Location: [facility];  Service: General;  Laterality: Right;  right subclavian   VESICOVAGINAL FISTULA CLOSURE W/ TAH     [facility]   Patient Active Problem List   Diagnosis Date Noted   Bilateral shoulder pain 12/19/2021   Fatigue 03/27/2021   Shortness of breath 03/27/2021   GERD (gastroesophageal reflux disease) 03/27/2021   Knee pain, left  02/15/2020   Generalized osteoarthritis of multiple sites 04/02/2018   Allergic rhinitis 09/01/2015   Obesity 12/13/2014   Long term current use of anticoagulant therapy 12/07/2014   Osteopenia 08/24/2014   Infiltrating ductal carcinoma of breast ([place]) 04/10/2011   Hyperlipemia 11/07/2007   Hypothyroidism 08/15/2007   Essential hypertension 08/15/2007    PCP: [name] MD  REFERRING PROVIDER: [name] MD  REFERRING DIAG: M25.511,G89.29,M25.512 (ICD-10-CM) - Chronic pain of both shoulders M25.562,G89.29 (ICD-10-CM) - Chronic pain of left knee   THERAPY DIAG:  Chronic pain of left knee  Chronic right shoulder pain  Muscle weakness (generalized)  Other abnormalities of gait and mobility  Rationale for Evaluation and Treatment Rehabilitation  ONSET DATE: approximately 2013  SUBJECTIVE:   SUBJECTIVE STATEMENT: Pt states the morning are the worst and she usually has to use a RW for a while before she transitions to a SPC.  Reports discomfort in her Rt shoulder with movement, Rt hip and Lt knee with weight bearing.  Really has no pain when she is resting    PERTINENT HISTORY:  Right shoulder soreness and limited AROM, left arm soreness but more ROM  Left knee pain with genu valgum  Severe arthritis There is severe tricompartmental degenerative joint disease of the  left knee with marked bone on bone and osteophytes more laterally and knock-knee deformity present.  No fracture or loose body noted.  Bone quality is good.  Breast cancer in 2012  Knee pain, left Increased pain , stiffness and instability    Bilateral shoulder pain Pain , stiffness and reduced shoulder mobility right greater than left  History of left hip fx with ORIF       PAIN:  Are you having pain? Yes: NPRS scale: Right shoulder 5/10 knee at worst 10/10 currently 0 /10 Pain location: right shoulder and left knee Pain description: shoulder aching. Knee aching Aggravating factors:  standing and getting up or using arm in adls Relieving factors: massaging area  PRECAUTIONS: None  WEIGHT BEARING RESTRICTIONS No  FALLS:  Has patient fallen in last 6 months? No and patient states she has had near falls but catches herself with hands on wall etc.    LIVING ENVIRONMENT: Lives with: lives alone Lives in: House/apartment Stairs:  stairs outside to basement and on back deck but hasn't done them in 15 years.  Also has attic in home but again has not done in many years.  Has following equipment at home: Quad cane small base, Environmental consultant - 2 wheeled, Environmental consultant - 4 wheeled, shower chair, and Grab bars  OCCUPATION: retired  PLOF: Independent with household mobility with device and Independent with homemaking with ambulation  PATIENT GOALS   -Primary improve strength of knee, try to decrease pain, more steady on feet.  -Secondary improve right shoulder strength, improve range of right shoulder, and decrease pain.    OBJECTIVE:   DIAGNOSTIC FINDINGS:  Impression:  Severe tricompartmental degenerative joint disease with marked bone on bone laterally  PATIENT SURVEYS:  FOTO 50  COGNITION:  Overall cognitive status: Within functional limits for tasks assessed     SENSATION: WFL  EDEMA:  In left knee; medial and proximal to patella    POSTURE: rounded shoulders, forward head, and increased thoracic kyphosis    LOWER EXTREMITY MMT:  MMT Right eval Left eval  Hip flexion 4 4  Hip extension 3 3  Hip abduction    Hip adduction    Hip internal rotation    Hip external rotation    Knee flexion    Knee extension 3 (pts knee gives in pain with cracking with minimal pressure) 3-  Ankle dorsiflexion 3 with minimal pressure knees start cracking) 3(with minimal pressure knees start cracking)  Ankle plantarflexion    Ankle inversion 3 3  Ankle eversion 3    (Blank rows = not tested)  LOWER EXTREMITY ROM:  AROM Right eval Left eval  Knee flexion 100 80  Knee  extension -5 -20    UPPER EXTREMITY ROM:  Active ROM Right eval Left eval  Shoulder flexion 50 AROM 120 degrees and then start of pain   Shoulder extension    Shoulder abduction 60 degrees AROM  130 PROM    (Blank rows = not tested)  UPPER EXTREMITY MMT:  MMT Right eval Left eval  Shoulder flexion 3-   Shoulder extension    Shoulder abduction 3-   Elbow extension 3+   Wrist flexion 3+    (Blank rows = not tested)   FUNCTIONAL TESTS:  5 times sit to stand: 80.46 seconds with b/l UE support Timed up and go (TUG): to Left 60.79 seconds with NBQC  GAIT: Distance walked: 100' Assistive device utilized: Quad cane small base Level of assistance: SBA and CGA Comments: increwased  trunk flexion, decreased cadence, short step length, poor placement of cane on ground, decreased weightbearing on left LE  Sit to stand  B/L ue use at Close supervision, pt with large BOS and must come up in increments, does not get knees flexed enough   TODAY'S TREATMENT: 01/17/22 HEP established, goals reviewed Seated:  LAQ 10X5" each LE  Hip flexion 10X alternating bilaterally Supine: Heelslides 10X each LE  Bridging with knees bent in available ROM  PVC pipe Rt UE flexion AAROM with Lt assist 5X  PVC pipe Rt UE Abduction AAROM with Lt assit 5X  PATIENT EDUCATION:  Education details: 6/14: Goals reviewed, HEP initiated; evaluation: Patient educated on exam findings, POC, scope of PT. Person educated: Patient Education method: Explanation, Demonstration, and Handouts Education comprehension: verbalized understanding, returned demonstration, verbal cues required, and tactile cues required    HOME EXERCISE PROGRAM: Access Code: [email or web address] URL: [email or web address] Date: 01/17/2022 Prepared by: Roseanne Reno  Exercises - Seated Long Arc Quad  - 2 x daily - 7 x weekly - 2 sets - 10 reps - 5 seconds hold - Seated March  - 2 x daily - 7 x weekly - 2 sets - 10 reps - Supine  Heel Slide  - 2 x daily - 7 x weekly - 2 sets - 10 reps - Bridge  - 2 x daily - 7 x weekly - 2 sets - 10 reps - Supine Shoulder Flexion with Dowel  - 2 x daily - 7 x weekly - 2 sets - 10 reps - Supine Shoulder Abduction AAROM with Dowel  - 2 x daily - 7 x weekly - 2 sets - 10 reps  ASSESSMENT:  CLINICAL IMPRESSION: Reviewed goals and POC moving forward with patient today.  Established HEP with patient and given written instructions. Pt able to complete LE therex actively with verbal and tactile cues.  Required AAROM with help of LT UE to complete ROM exercises using dowel on Rt.  Pt with improved Rt UE ROM with every repetition.  Pt able to complete most therex without complaints of pain.  Initial discomfort voiced with Lt knee flexion and Rt UE motions.  Educated on importance of maintaining correct posturing with therex and also worked on gait using improved upright posturing with forward gaze.  Patients knee has gradually become more unstable over past 10 years.  Patient will benefit from skilled PT services to improve UE rom, strength, trunk and core strength, left knee strength and stability all to improve functional mobility, decrease pain and decrease risk of falls.    OBJECTIVE IMPAIRMENTS Abnormal gait, decreased activity tolerance, decreased balance, decreased endurance, decreased knowledge of use of DME, decreased mobility, difficulty walking, decreased ROM, decreased strength, increased edema, increased fascial restrictions, impaired flexibility, impaired UE functional use, postural dysfunction, and pain.   ACTIVITY LIMITATIONS carrying, lifting, bending, standing, squatting, stairs, transfers, bed mobility, bathing, toileting, dressing, reach over head, and locomotion level  PARTICIPATION LIMITATIONS: cleaning, laundry, shopping, community activity, and yard work  PERSONAL FACTORS Age and 3+ comorbidities:    Allergies, Arthritis, Back pain, BMI over 30, Cancer, Hearing Impairment,  High Blood Pressure, Osteoporosis, Prior Surgeryare also affecting patient's functional outcome.   REHAB POTENTIAL: Fair chronicity of impairment, multi areas affected  CLINICAL DECISION MAKING: Stable/uncomplicated  EVALUATION COMPLEXITY: High   GOALS: Goals reviewed with patient? Yes  SHORT TERM GOALS: Target date: 02/09/2022  Patient will be independent with HEP in order to improve functional outcomes. Baseline:  Goal status: IN PROGRESS  2.  Patient will report at least 25% improvement in symptoms for improved quality of life. Baseline:  Goal status: IN PROGRESS  3.  Patient will improve five time sit to stand by 20 seconds to work towards improved efficiency in transfer and demonstrate improving LE strength. Baseline: 5 times sit to stand: 80.46 seconds with b/l UE support Goal status: IN PROGRESS  4.  Pt will improve TUG by 10 seconds to demonstrate improving efficiency in ambulation Baseline:  Timed up and go (TUG): to Left 60.79 seconds with NBQC Goal status: IN PROGRESS  5.  Pt will improve right shoulder arom to 70 degrees to work towards improved functional use in adls Baseline: 50 degrees Goal status: IN PROGRESS    LONG TERM GOALS: Target date: 03/09/2022  1.  Patient will improve FOTO score by at least 10 points in order to indicate improved tolerance to activity. Baseline: 50 Goal status: IN PROGRESS  2. Patient will improve five time sit to stand by 40 seconds to work towards improved efficiency in transfer and demonstrate improving LE strength. Baseline: 5 times sit to stand: 80.46 seconds with b/l UE support Goal status: IN PROGRESS  4.  Pt will improve TUG by 20 seconds to demonstrate improving efficiency in ambulation Baseline:  Timed up and go (TUG): to Left 60.79 seconds with NBQC Goal status: IN PROGRESS  5.  Pt will improve right shoulder arom to 90 degrees to work towards improved functional use in adls Baseline: 50 degrees Goal status: IN  PROGRESS    PLAN: PT FREQUENCY: 2x/week  PT DURATION: 8 weeks  PLANNED INTERVENTIONS: PLANNED INTERVENTIONS: Therapeutic exercises, Therapeutic activity, Neuromuscular re-education, Balance training, Gait training, Patient/Family education, Joint manipulation, Joint mobilization, Stair training, Orthotic/Fit training, DME instructions, Aquatic Therapy, Dry Needling, Electrical stimulation, Spinal manipulation, Spinal mobilization, Cryotherapy, Moist heat, Compression bandaging, scar mobilization, Splintting, Taping, Traction, Ultrasound, Ionotophoresis '4mg'$ /ml Dexamethasone, and Manual therapy   PLAN FOR NEXT SESSION: HEP for shoulder R shoulder ROM and strength. HEP for Left hip and knee strength.    Teena Irani, PTA/CLT [place] Ph: [phone]  Teena Irani, PTA 01/17/2022, 11:42 AM

## 2022-02-08 ENCOUNTER — Ambulatory Visit (HOSPITAL_COMMUNITY): Payer: Medicare HMO | Attending: Family Medicine | Admitting: Physical Therapy

## 2022-02-08 DIAGNOSIS — M25511 Pain in right shoulder: Secondary | ICD-10-CM | POA: Insufficient documentation

## 2022-02-08 DIAGNOSIS — M6281 Muscle weakness (generalized): Secondary | ICD-10-CM | POA: Insufficient documentation

## 2022-02-08 DIAGNOSIS — M25562 Pain in left knee: Secondary | ICD-10-CM | POA: Insufficient documentation

## 2022-02-08 DIAGNOSIS — R2689 Other abnormalities of gait and mobility: Secondary | ICD-10-CM | POA: Insufficient documentation

## 2022-02-08 DIAGNOSIS — G8929 Other chronic pain: Secondary | ICD-10-CM | POA: Insufficient documentation

## 2022-02-08 NOTE — Therapy (Signed)
OUTPATIENT PHYSICAL THERAPY TREATMENT  Patient Name: Lisa Klein MRN: [id number] DOB:1929/12/31, 88 y.o., female Today's Date: 02/08/2022   PT End of Session - 02/08/22 1209     Visit Number 3    Number of Visits 16    Date for PT Re-Evaluation 03/09/22    Authorization Type [employer] H[employer] (auth required, no visit limit based on med necessity) (please check)    Authorization Time Period 12 visits approved 6/14-7/27    Authorization - Visit Number 3    Authorization - Number of Visits 12    PT Start Time 1118    PT Stop Time 1200    PT Time Calculation (min) 42 min              Past Medical History:  Diagnosis Date   Cancer ([place]) 2012   LEFT BREAST   Clotting disorder ([place]) 05/2011   dvt, right leg   Degenerative disc disease    with nerve compression    DJD (degenerative joint disease)    Hypertension    Hypertension    Hypothyroidism    Hypothyroidism    Long term current use of anticoagulant therapy 12/07/2014   Pulmonary embolus    Obesity    Osteopenia 08/24/2014   Past Surgical History:  Procedure Laterality Date   ABDOMINAL HYSTERECTOMY  1980   fibroids   BREAST SURGERY  2012   left total mastectomy   CHOLECYSTECTOMY  80'S   [facility]   CHOLECYSTECTOMY     HIP PINNING,CANNULATED Left 01/19/2013   Procedure: CANNULATED HIP PINNING-  left;  Surgeon: Marin Shutter, MD;  Location: [facility];  Service: Orthopedics;  Laterality: Left;   MASTECTOMY MODIFIED RADICAL  02/14/11   left   PORT-A-CATH REMOVAL N/A 12/05/2012   Procedure: MINOR REMOVAL PORT-A-CATH;  Surgeon: Jamesetta So, MD;  Location: [facility];  Service: General;  Laterality: N/A;  In Minor Room   PORTACATH PLACEMENT  04/16/2011   Procedure: INSERTION PORT-A-CATH;  Surgeon: Jamesetta So;  Location: [facility];  Service: General;  Laterality: Right;  right subclavian   VESICOVAGINAL FISTULA CLOSURE W/ TAH     [facility]   Patient Active Problem List   Diagnosis Date Noted   Bilateral shoulder pain 12/19/2021    Fatigue 03/27/2021   Shortness of breath 03/27/2021   GERD (gastroesophageal reflux disease) 03/27/2021   Knee pain, left 02/15/2020   Generalized osteoarthritis of multiple sites 04/02/2018   Allergic rhinitis 09/01/2015   Obesity 12/13/2014   Long term current use of anticoagulant therapy 12/07/2014   Osteopenia 08/24/2014   Infiltrating ductal carcinoma of breast ([place]) 04/10/2011   Hyperlipemia 11/07/2007   Hypothyroidism 08/15/2007   Essential hypertension 08/15/2007    PCP: [name] MD  REFERRING PROVIDER: [name] MD  REFERRING DIAG: M25.511,G89.29,M25.512 (ICD-10-CM) - Chronic pain of both shoulders M25.562,G89.29 (ICD-10-CM) - Chronic pain of left knee   THERAPY DIAG:  Chronic pain of left knee  Chronic right shoulder pain  Muscle weakness (generalized)  Other abnormalities of gait and mobility  Rationale for Evaluation and Treatment Rehabilitation  ONSET DATE: approximately 2013  SUBJECTIVE:   SUBJECTIVE STATEMENT: Pt reports she only has pain with movement.  More pain when weather changes due to arthritis. Currently without pain or issues.   PERTINENT HISTORY:  Right shoulder soreness and limited AROM, left arm soreness but more ROM  Left knee pain with genu valgum  Severe arthritis There is severe tricompartmental degenerative joint disease of the left  knee with marked bone on bone and osteophytes more laterally and knock-knee deformity present.  No fracture or loose body noted.  Bone quality is good.  Breast cancer in 2012  Knee pain, left Increased pain , stiffness and instability    Bilateral shoulder pain Pain , stiffness and reduced shoulder mobility right greater than left  History of left hip fx with ORIF       PAIN:  Are you having pain? Yes: NPRS scale: Right shoulder 5/10 knee at worst 10/10 currently 0 /10 Pain location: right shoulder and left knee Pain description: shoulder aching. Knee aching Aggravating  factors: standing and getting up or using arm in adls Relieving factors: massaging area  PRECAUTIONS: None  WEIGHT BEARING RESTRICTIONS No  FALLS:  Has patient fallen in last 6 months? No and patient states she has had near falls but catches herself with hands on wall etc.    LIVING ENVIRONMENT: Lives with: lives alone Lives in: House/apartment Stairs:  stairs outside to basement and on back deck but hasn't done them in 15 years.  Also has attic in home but again has not done in many years.  Has following equipment at home: Quad cane small base, Environmental consultant - 2 wheeled, Environmental consultant - 4 wheeled, shower chair, and Grab bars  OCCUPATION: retired  PLOF: Independent with household mobility with device and Independent with homemaking with ambulation  PATIENT GOALS   -Primary improve strength of knee, try to decrease pain, more steady on feet.  -Secondary improve right shoulder strength, improve range of right shoulder, and decrease pain.    OBJECTIVE:   DIAGNOSTIC FINDINGS:  Impression:  Severe tricompartmental degenerative joint disease with marked bone on bone laterally  PATIENT SURVEYS:  FOTO 50  COGNITION:  Overall cognitive status: Within functional limits for tasks assessed     SENSATION: WFL  EDEMA:  In left knee; medial and proximal to patella    POSTURE: rounded shoulders, forward head, and increased thoracic kyphosis    LOWER EXTREMITY MMT:  MMT Right eval Left eval  Hip flexion 4 4  Hip extension 3 3  Hip abduction    Hip adduction    Hip internal rotation    Hip external rotation    Knee flexion    Knee extension 3 (pts knee gives in pain with cracking with minimal pressure) 3-  Ankle dorsiflexion 3 with minimal pressure knees start cracking) 3(with minimal pressure knees start cracking)  Ankle plantarflexion    Ankle inversion 3 3  Ankle eversion 3    (Blank rows = not tested)  LOWER EXTREMITY ROM:  AROM Right eval Left eval  Knee flexion 100 80   Knee extension -5 -20    UPPER EXTREMITY ROM:  Active ROM Right eval Left eval  Shoulder flexion 50 AROM 120 degrees and then start of pain   Shoulder extension    Shoulder abduction 60 degrees AROM  130 PROM    (Blank rows = not tested)  UPPER EXTREMITY MMT:  MMT Right eval Left eval  Shoulder flexion 3-   Shoulder extension    Shoulder abduction 3-   Elbow extension 3+   Wrist flexion 3+    (Blank rows = not tested)   FUNCTIONAL TESTS:  5 times sit to stand: 80.46 seconds with b/l UE support Timed up and go (TUG): to Left 60.79 seconds with NBQC  GAIT: Distance walked: 100' Assistive device utilized: Quad cane small base Level of assistance: SBA and CGA Comments: increwased trunk  flexion, decreased cadence, short step length, poor placement of cane on ground, decreased weightbearing on left LE  Sit to stand  B/L ue use at Close supervision, pt with large BOS and must come up in increments, does not get knees flexed enough   TODAY'S TREATMENT: 02/08/22 Seated:  LAQ 10X5" each LE AROM  Hip flexion 10X alternating bilaterally AROM  Sit to stand with less UE assist 5X Supine: Heelslides 10X each LE  Bridging with knees bent in available ROM  PVC pipe Rt UE flexion AAROM with Lt assist 5X  PVC pipe Rt UE Abduction AAROM with Lt assit 5X  Hip abduction 10X each  01/17/22 HEP established, goals reviewed Seated:  LAQ 10X5" each LE  Hip flexion 10X alternating bilaterally Supine: Heelslides 10X each LE  Bridging with knees bent in available ROM  PVC pipe Rt UE flexion AAROM with Lt assist 5X  PVC pipe Rt UE Abduction AAROM with Lt assit 5X  PATIENT EDUCATION:  Education details: 6/14: Goals reviewed, HEP initiated; evaluation: Patient educated on exam findings, POC, scope of PT. Person educated: Patient Education method: Explanation, Demonstration, and Handouts Education comprehension: verbalized understanding, returned demonstration, verbal cues required,  and tactile cues required    HOME EXERCISE PROGRAM: Access Code: [email or web address] URL: [email or web address] Date: 01/17/2022 Prepared by: Roseanne Reno  Exercises - Seated Long Arc Quad  - 2 x daily - 7 x weekly - 2 sets - 10 reps - 5 seconds hold - Seated March  - 2 x daily - 7 x weekly - 2 sets - 10 reps - Supine Heel Slide  - 2 x daily - 7 x weekly - 2 sets - 10 reps - Bridge  - 2 x daily - 7 x weekly - 2 sets - 10 reps - Supine Shoulder Flexion with Dowel  - 2 x daily - 7 x weekly - 2 sets - 10 reps - Supine Shoulder Abduction AAROM with Dowel  - 2 x daily - 7 x weekly - 2 sets - 10 reps  ASSESSMENT:  CLINICAL IMPRESSION: Continued focus on improving functional strength and mobility. Pt able to complete all LE therex actively with verbal and tactile cues, AAROM to complete UE therex.  Most difficulty with eccentric control with LAQ as it is painful, weak and difficulty maintaining upright seated posturing leaning back into extension.  Attempted standing from elevated height without UE's, however pt unable to complete as relys 95% on UE's to complete task due to LE weakness and knee crepitus. Patient will benefit from skilled PT services to improve UE rom, strength, trunk and core strength, left knee strength and stability all to improve functional mobility, decrease pain and decrease risk of falls.    OBJECTIVE IMPAIRMENTS Abnormal gait, decreased activity tolerance, decreased balance, decreased endurance, decreased knowledge of use of DME, decreased mobility, difficulty walking, decreased ROM, decreased strength, increased edema, increased fascial restrictions, impaired flexibility, impaired UE functional use, postural dysfunction, and pain.   ACTIVITY LIMITATIONS carrying, lifting, bending, standing, squatting, stairs, transfers, bed mobility, bathing, toileting, dressing, reach over head, and locomotion level  PARTICIPATION LIMITATIONS: cleaning, laundry, shopping, community  activity, and yard work  PERSONAL FACTORS Age and 3+ comorbidities:    Allergies, Arthritis, Back pain, BMI over 30, Cancer, Hearing Impairment, High Blood Pressure, Osteoporosis, Prior Surgeryare also affecting patient's functional outcome.   REHAB POTENTIAL: Fair chronicity of impairment, multi areas affected  CLINICAL DECISION MAKING: Stable/uncomplicated  EVALUATION COMPLEXITY: High   GOALS: Goals reviewed with patient? Yes  SHORT TERM GOALS: Target date: 02/09/2022  Patient will be independent with HEP in order to improve functional outcomes. Baseline:  Goal status: IN PROGRESS  2.  Patient will report at least 25% improvement in symptoms for improved quality of life. Baseline:  Goal status: IN PROGRESS  3.  Patient will improve five time sit to stand by 20 seconds to work towards improved efficiency in transfer and demonstrate improving LE strength. Baseline: 5 times sit to stand: 80.46 seconds with b/l UE support Goal status: IN PROGRESS  4.  Pt will improve TUG by 10 seconds to demonstrate improving efficiency in ambulation Baseline:  Timed up and go (TUG): to Left 60.79 seconds with NBQC Goal status: IN PROGRESS  5.  Pt will improve right shoulder arom to 70 degrees to work towards improved functional use in adls Baseline: 50 degrees Goal status: IN PROGRESS    LONG TERM GOALS: Target date: 03/09/2022  1.  Patient will improve FOTO score by at least 10 points in order to indicate improved tolerance to activity. Baseline: 50 Goal status: IN PROGRESS  2. Patient will improve five time sit to stand by 40 seconds to work towards improved efficiency in transfer and demonstrate improving LE strength. Baseline: 5 times sit to stand: 80.46 seconds with b/l UE support Goal status: IN PROGRESS  4.  Pt will improve TUG by 20 seconds to demonstrate improving efficiency in ambulation Baseline:  Timed up and go (TUG): to Left 60.79 seconds with NBQC Goal status: IN  PROGRESS  5.  Pt will improve right shoulder arom to 90 degrees to work towards improved functional use in adls Baseline: 50 degrees Goal status: IN PROGRESS    PLAN: PT FREQUENCY: 2x/week  PT DURATION: 8 weeks  PLANNED INTERVENTIONS: PLANNED INTERVENTIONS: Therapeutic exercises, Therapeutic activity, Neuromuscular re-education, Balance training, Gait training, Patient/Family education, Joint manipulation, Joint mobilization, Stair training, Orthotic/Fit training, DME instructions, Aquatic Therapy, Dry Needling, Electrical stimulation, Spinal manipulation, Spinal mobilization, Cryotherapy, Moist heat, Compression bandaging, scar mobilization, Splintting, Taping, Traction, Ultrasound, Ionotophoresis '4mg'$ /ml Dexamethasone, and Manual therapy   PLAN FOR NEXT SESSION: HEP for shoulder R shoulder ROM and strength. HEP for Left hip and knee strength.    Teena Irani, PTA/CLT [place] Ph: [phone]  Teena Irani, PTA 02/08/2022, 12:10 PM

## 2022-02-12 ENCOUNTER — Ambulatory Visit (HOSPITAL_COMMUNITY): Payer: Medicare HMO

## 2022-02-12 ENCOUNTER — Encounter (HOSPITAL_COMMUNITY): Payer: Self-pay

## 2022-02-12 DIAGNOSIS — R2689 Other abnormalities of gait and mobility: Secondary | ICD-10-CM | POA: Diagnosis not present

## 2022-02-12 DIAGNOSIS — M25511 Pain in right shoulder: Secondary | ICD-10-CM | POA: Diagnosis not present

## 2022-02-12 DIAGNOSIS — M6281 Muscle weakness (generalized): Secondary | ICD-10-CM | POA: Diagnosis not present

## 2022-02-12 DIAGNOSIS — G8929 Other chronic pain: Secondary | ICD-10-CM

## 2022-02-12 DIAGNOSIS — M25562 Pain in left knee: Secondary | ICD-10-CM | POA: Diagnosis not present

## 2022-02-12 NOTE — Therapy (Signed)
OUTPATIENT PHYSICAL THERAPY TREATMENT  Patient Name: Lisa Klein MRN: [id number] DOB:1929/12/13, 88 y.o., female Today's Date: 02/12/2022   PT End of Session - 02/12/22 1514     Visit Number 4    Number of Visits 16    Date for PT Re-Evaluation 03/09/22    Authorization Type [employer] H[employer] (auth required, no visit limit based on med necessity) (please check)    Authorization Time Period 12 visits approved 6/14-7/27    Authorization - Visit Number 4    Authorization - Number of Visits 12    Progress Note Due on Visit 12    PT Start Time 1761    PT Stop Time 1600    PT Time Calculation (min) 45 min              Past Medical History:  Diagnosis Date   Cancer ([place]) 2012   LEFT BREAST   Clotting disorder ([place]) 05/2011   dvt, right leg   Degenerative disc disease    with nerve compression    DJD (degenerative joint disease)    Hypertension    Hypertension    Hypothyroidism    Hypothyroidism    Long term current use of anticoagulant therapy 12/07/2014   Pulmonary embolus    Obesity    Osteopenia 08/24/2014   Past Surgical History:  Procedure Laterality Date   ABDOMINAL HYSTERECTOMY  1980   fibroids   BREAST SURGERY  2012   left total mastectomy   CHOLECYSTECTOMY  80'S   [facility]   CHOLECYSTECTOMY     HIP PINNING,CANNULATED Left 01/19/2013   Procedure: CANNULATED HIP PINNING-  left;  Surgeon: Marin Shutter, MD;  Location: [facility];  Service: Orthopedics;  Laterality: Left;   MASTECTOMY MODIFIED RADICAL  02/14/11   left   PORT-A-CATH REMOVAL N/A 12/05/2012   Procedure: MINOR REMOVAL PORT-A-CATH;  Surgeon: Jamesetta So, MD;  Location: [facility];  Service: General;  Laterality: N/A;  In Minor Room   PORTACATH PLACEMENT  04/16/2011   Procedure: INSERTION PORT-A-CATH;  Surgeon: Jamesetta So;  Location: [facility];  Service: General;  Laterality: Right;  right subclavian   VESICOVAGINAL FISTULA CLOSURE W/ TAH     [facility]   Patient Active Problem List   Diagnosis Date Noted    Bilateral shoulder pain 12/19/2021   Fatigue 03/27/2021   Shortness of breath 03/27/2021   GERD (gastroesophageal reflux disease) 03/27/2021   Knee pain, left 02/15/2020   Generalized osteoarthritis of multiple sites 04/02/2018   Allergic rhinitis 09/01/2015   Obesity 12/13/2014   Long term current use of anticoagulant therapy 12/07/2014   Osteopenia 08/24/2014   Infiltrating ductal carcinoma of breast ([place]) 04/10/2011   Hyperlipemia 11/07/2007   Hypothyroidism 08/15/2007   Essential hypertension 08/15/2007    PCP: [name] MD  REFERRING PROVIDER: [name] MD  REFERRING DIAG: M25.511,G89.29,M25.512 (ICD-10-CM) - Chronic pain of both shoulders M25.562,G89.29 (ICD-10-CM) - Chronic pain of left knee   THERAPY DIAG:  Chronic pain of left knee  Chronic right shoulder pain  Muscle weakness (generalized)  Other abnormalities of gait and mobility  Rationale for Evaluation and Treatment Rehabilitation  ONSET DATE: approximately 2013  SUBJECTIVE:   SUBJECTIVE STATEMENT: Pt reports she is sore and achy all over. More pain when weather changes due to arthritis. Patient ready for PT.   PERTINENT HISTORY:  Right shoulder soreness and limited AROM, left arm soreness but more ROM  Left knee pain with genu valgum  Severe arthritis There is  severe tricompartmental degenerative joint disease of the left knee with marked bone on bone and osteophytes more laterally and knock-knee deformity present.  No fracture or loose body noted.  Bone quality is good.  Breast cancer in 2012  Knee pain, left Increased pain , stiffness and instability    Bilateral shoulder pain Pain , stiffness and reduced shoulder mobility right greater than left  History of left hip fx with ORIF       PAIN:  Are you having pain? Yes: NPRS scale: Right shoulder 5/10 knee at worst 10/10 currently 7/10 Pain location: right shoulder and left knee Pain description: shoulder aching. Knee  aching Aggravating factors: standing and getting up or using arm in adls Relieving factors: massaging area  PRECAUTIONS: None  WEIGHT BEARING RESTRICTIONS No  FALLS:  Has patient fallen in last 6 months? No and patient states she has had near falls but catches herself with hands on wall etc.    LIVING ENVIRONMENT: Lives with: lives alone Lives in: House/apartment Stairs:  stairs outside to basement and on back deck but hasn't done them in 15 years.  Also has attic in home but again has not done in many years.  Has following equipment at home: Quad cane small base, Environmental consultant - 2 wheeled, Environmental consultant - 4 wheeled, shower chair, and Grab bars  OCCUPATION: retired  PLOF: Independent with household mobility with device and Independent with homemaking with ambulation  PATIENT GOALS   -Primary improve strength of knee, try to decrease pain, more steady on feet.  -Secondary improve right shoulder strength, improve range of right shoulder, and decrease pain.    OBJECTIVE:   DIAGNOSTIC FINDINGS:  Impression:  Severe tricompartmental degenerative joint disease with marked bone on bone laterally  PATIENT SURVEYS:  FOTO 50  COGNITION:  Overall cognitive status: Within functional limits for tasks assessed     SENSATION: WFL  EDEMA:  In left knee; medial and proximal to patella    POSTURE: rounded shoulders, forward head, and increased thoracic kyphosis    LOWER EXTREMITY MMT:  MMT Right eval Left eval  Hip flexion 4 4  Hip extension 3 3  Hip abduction    Hip adduction    Hip internal rotation    Hip external rotation    Knee flexion    Knee extension 3 (pts knee gives in pain with cracking with minimal pressure) 3-  Ankle dorsiflexion 3 with minimal pressure knees start cracking) 3(with minimal pressure knees start cracking)  Ankle plantarflexion    Ankle inversion 3 3  Ankle eversion 3    (Blank rows = not tested)  LOWER EXTREMITY ROM:  AROM Right eval Left eval   Knee flexion 100 80  Knee extension -5 -20    UPPER EXTREMITY ROM:  Active ROM Right eval Left eval  Shoulder flexion 50 AROM 120 degrees and then start of pain   Shoulder extension    Shoulder abduction 60 degrees AROM  130 PROM    (Blank rows = not tested)  UPPER EXTREMITY MMT:  MMT Right eval Left eval  Shoulder flexion 3-   Shoulder extension    Shoulder abduction 3-   Elbow extension 3+   Wrist flexion 3+    (Blank rows = not tested)   FUNCTIONAL TESTS:  5 times sit to stand: 80.46 seconds with b/l UE support Timed up and go (TUG): to Left 60.79 seconds with NBQC  GAIT: Distance walked: 100' Assistive device utilized: Lobbyist Level of  assistance: SBA and CGA Comments: increwased trunk flexion, decreased cadence, short step length, poor placement of cane on ground, decreased weightbearing on left LE  Sit to stand  B/L ue use at Close supervision, pt with large BOS and must come up in increments, does not get knees flexed enough   TODAY'S TREATMENT: 02/12/22 UE pulleys into flexion and abd x2 mins Step up anterior x5 reps b/l onto 4 inch step Lateral step up 4 inch step x6 reps Attempted mini knee bends x5 Heel raises x15 Nustep level 1 x6 mins, half rom to begin into full rom Mini bridge x10 Supine marches x10 Mini crunch with UE tap to opposite knee x10 SLR x10    02/08/22 Seated:  LAQ 10X5" each LE AROM  Hip flexion 10X alternating bilaterally AROM  Sit to stand with less UE assist 5X Supine: Heelslides 10X each LE  Bridging with knees bent in available ROM  PVC pipe Rt UE flexion AAROM with Lt assist 5X  PVC pipe Rt UE Abduction AAROM with Lt assit 5X  Hip abduction 10X each  01/17/22 HEP established, goals reviewed Seated:  LAQ 10X5" each LE  Hip flexion 10X alternating bilaterally Supine: Heelslides 10X each LE  Bridging with knees bent in available ROM  PVC pipe Rt UE flexion AAROM with Lt assist 5X  PVC pipe Rt UE  Abduction AAROM with Lt assit 5X  PATIENT EDUCATION:  Education details: 6/14: Goals reviewed, HEP initiated; evaluation: Patient educated on exam findings, POC, scope of PT. Person educated: Patient Education method: Explanation, Demonstration, and Handouts Education comprehension: verbalized understanding, returned demonstration, verbal cues required, and tactile cues required    HOME EXERCISE PROGRAM: Access Code: [email or web address] URL: [email or web address] Date: 01/17/2022 Prepared by: Roseanne Reno  Exercises - Seated Long Arc Quad  - 2 x daily - 7 x weekly - 2 sets - 10 reps - 5 seconds hold - Seated March  - 2 x daily - 7 x weekly - 2 sets - 10 reps - Supine Heel Slide  - 2 x daily - 7 x weekly - 2 sets - 10 reps - Bridge  - 2 x daily - 7 x weekly - 2 sets - 10 reps - Supine Shoulder Flexion with Dowel  - 2 x daily - 7 x weekly - 2 sets - 10 reps - Supine Shoulder Abduction AAROM with Dowel  - 2 x daily - 7 x weekly - 2 sets - 10 reps  ASSESSMENT:  CLINICAL IMPRESSION: Continued focus on improving functional strength and mobility. Patient unable to actively use knee flexion in functional movements. Pt hardly can tolerate unlocking her knees into a mini squat at ll bar or bend and extending knee in seated or supine.  Pt with some more knee flexion as time lapses on nustep without resistance. Patient reports that her knees feel better post session.  Patient will benefit from skilled PT services to improve UE rom, strength, trunk and core strength, left knee strength and stability all to improve functional mobility, decrease pain and decrease risk of falls.    OBJECTIVE IMPAIRMENTS Abnormal gait, decreased activity tolerance, decreased balance, decreased endurance, decreased knowledge of use of DME, decreased mobility, difficulty walking, decreased ROM, decreased strength, increased edema, increased fascial restrictions, impaired flexibility, impaired UE functional use, postural  dysfunction, and pain.   ACTIVITY LIMITATIONS carrying, lifting, bending, standing, squatting, stairs, transfers, bed mobility, bathing, toileting, dressing, reach over head, and locomotion level  PARTICIPATION LIMITATIONS: cleaning, laundry, shopping, community activity,  and yard work  PERSONAL FACTORS Age and 3+ comorbidities:    Allergies, Arthritis, Back pain, BMI over 30, Cancer, Hearing Impairment, High Blood Pressure, Osteoporosis, Prior Surgeryare also affecting patient's functional outcome.   REHAB POTENTIAL: Fair chronicity of impairment, multi areas affected  CLINICAL DECISION MAKING: Stable/uncomplicated  EVALUATION COMPLEXITY: High   GOALS: Goals reviewed with patient? Yes  SHORT TERM GOALS: Target date: 02/09/2022  Patient will be independent with HEP in order to improve functional outcomes. Baseline:  Goal status: IN PROGRESS  2.  Patient will report at least 25% improvement in symptoms for improved quality of life. Baseline:  Goal status: IN PROGRESS  3.  Patient will improve five time sit to stand by 20 seconds to work towards improved efficiency in transfer and demonstrate improving LE strength. Baseline: 5 times sit to stand: 80.46 seconds with b/l UE support Goal status: IN PROGRESS  4.  Pt will improve TUG by 10 seconds to demonstrate improving efficiency in ambulation Baseline:  Timed up and go (TUG): to Left 60.79 seconds with NBQC Goal status: IN PROGRESS  5.  Pt will improve right shoulder arom to 70 degrees to work towards improved functional use in adls Baseline: 50 degrees Goal status: IN PROGRESS    LONG TERM GOALS: Target date: 03/09/2022  1.  Patient will improve FOTO score by at least 10 points in order to indicate improved tolerance to activity. Baseline: 50 Goal status: IN PROGRESS  2. Patient will improve five time sit to stand by 40 seconds to work towards improved efficiency in transfer and demonstrate improving LE  strength. Baseline: 5 times sit to stand: 80.46 seconds with b/l UE support Goal status: IN PROGRESS  4.  Pt will improve TUG by 20 seconds to demonstrate improving efficiency in ambulation Baseline:  Timed up and go (TUG): to Left 60.79 seconds with NBQC Goal status: IN PROGRESS  5.  Pt will improve right shoulder arom to 90 degrees to work towards improved functional use in adls Baseline: 50 degrees Goal status: IN PROGRESS    PLAN: PT FREQUENCY: 2x/week  PT DURATION: 8 weeks  PLANNED INTERVENTIONS: PLANNED INTERVENTIONS: Therapeutic exercises, Therapeutic activity, Neuromuscular re-education, Balance training, Gait training, Patient/Family education, Joint manipulation, Joint mobilization, Stair training, Orthotic/Fit training, DME instructions, Aquatic Therapy, Dry Needling, Electrical stimulation, Spinal manipulation, Spinal mobilization, Cryotherapy, Moist heat, Compression bandaging, scar mobilization, Splintting, Taping, Traction, Ultrasound, Ionotophoresis '4mg'$ /ml Dexamethasone, and Manual therapy   PLAN FOR NEXT SESSION: continue to build right shoulder strength and ROM as well as B/L LE strength and stretching.     Deo Mehringer, PT 02/12/2022, 3:15 PM

## 2022-02-14 ENCOUNTER — Ambulatory Visit (HOSPITAL_COMMUNITY): Payer: Medicare HMO

## 2022-02-14 DIAGNOSIS — M6281 Muscle weakness (generalized): Secondary | ICD-10-CM | POA: Diagnosis not present

## 2022-02-14 DIAGNOSIS — R2689 Other abnormalities of gait and mobility: Secondary | ICD-10-CM

## 2022-02-14 DIAGNOSIS — M25562 Pain in left knee: Secondary | ICD-10-CM

## 2022-02-14 DIAGNOSIS — G8929 Other chronic pain: Secondary | ICD-10-CM

## 2022-02-14 DIAGNOSIS — M25511 Pain in right shoulder: Secondary | ICD-10-CM | POA: Diagnosis not present

## 2022-02-14 NOTE — Therapy (Signed)
OUTPATIENT PHYSICAL THERAPY TREATMENT  Patient Name: Lisa Klein MRN: [id number] DOB:09-Feb-1930, 88 y.o., female Today's Date: 02/14/2022   PT End of Session - 02/14/22 1433     Visit Number 5    Number of Visits 16    Date for PT Re-Evaluation 03/09/22    Authorization Type [employer] H[employer] (auth required, no visit limit based on med necessity) (please check)    Authorization Time Period 12 visits approved 6/14-7/27    Authorization - Visit Number 5    Authorization - Number of Visits 12    Progress Note Due on Visit 12    PT Start Time 1432    PT Stop Time 0814    PT Time Calculation (min) 42 min               Past Medical History:  Diagnosis Date   Cancer ([place]) 2012   LEFT BREAST   Clotting disorder ([place]) 05/2011   dvt, right leg   Degenerative disc disease    with nerve compression    DJD (degenerative joint disease)    Hypertension    Hypertension    Hypothyroidism    Hypothyroidism    Long term current use of anticoagulant therapy 12/07/2014   Pulmonary embolus    Obesity    Osteopenia 08/24/2014   Past Surgical History:  Procedure Laterality Date   ABDOMINAL HYSTERECTOMY  1980   fibroids   BREAST SURGERY  2012   left total mastectomy   CHOLECYSTECTOMY  80'S   [facility]   CHOLECYSTECTOMY     HIP PINNING,CANNULATED Left 01/19/2013   Procedure: CANNULATED HIP PINNING-  left;  Surgeon: Marin Shutter, MD;  Location: [facility];  Service: Orthopedics;  Laterality: Left;   MASTECTOMY MODIFIED RADICAL  02/14/11   left   PORT-A-CATH REMOVAL N/A 12/05/2012   Procedure: MINOR REMOVAL PORT-A-CATH;  Surgeon: Jamesetta So, MD;  Location: [facility];  Service: General;  Laterality: N/A;  In Minor Room   PORTACATH PLACEMENT  04/16/2011   Procedure: INSERTION PORT-A-CATH;  Surgeon: Jamesetta So;  Location: [facility];  Service: General;  Laterality: Right;  right subclavian   VESICOVAGINAL FISTULA CLOSURE W/ TAH     [facility]   Patient Active Problem List   Diagnosis Date Noted    Bilateral shoulder pain 12/19/2021   Fatigue 03/27/2021   Shortness of breath 03/27/2021   GERD (gastroesophageal reflux disease) 03/27/2021   Knee pain, left 02/15/2020   Generalized osteoarthritis of multiple sites 04/02/2018   Allergic rhinitis 09/01/2015   Obesity 12/13/2014   Long term current use of anticoagulant therapy 12/07/2014   Osteopenia 08/24/2014   Infiltrating ductal carcinoma of breast ([place]) 04/10/2011   Hyperlipemia 11/07/2007   Hypothyroidism 08/15/2007   Essential hypertension 08/15/2007    PCP: [name] MD  REFERRING PROVIDER: [name] MD  REFERRING DIAG: M25.511,G89.29,M25.512 (ICD-10-CM) - Chronic pain of both shoulders M25.562,G89.29 (ICD-10-CM) - Chronic pain of left knee   THERAPY DIAG:  Chronic pain of left knee  Other abnormalities of gait and mobility  Chronic right shoulder pain  Muscle weakness (generalized)  Rationale for Evaluation and Treatment Rehabilitation  ONSET DATE: approximately 2013  SUBJECTIVE:   SUBJECTIVE STATEMENT: Pain when walking in knees and legs; right shoulder feels a little better  PERTINENT HISTORY:  Right shoulder soreness and limited AROM, left arm soreness but more ROM  Left knee pain with genu valgum  Severe arthritis There is severe tricompartmental degenerative joint disease of the left  knee with marked bone on bone and osteophytes more laterally and knock-knee deformity present.  No fracture or loose body noted.  Bone quality is good.  Breast cancer in 2012  Knee pain, left Increased pain , stiffness and instability    Bilateral shoulder pain Pain , stiffness and reduced shoulder mobility right greater than left  History of left hip fx with ORIF       PAIN:  Are you having pain? Yes: NPRS scale: Right shoulder 5/10 knee at worst 10/10 currently 7/10 Pain location: right shoulder and left knee Pain description: shoulder aching. Knee aching Aggravating factors: standing and  getting up or using arm in adls Relieving factors: massaging area  PRECAUTIONS: None  WEIGHT BEARING RESTRICTIONS No  FALLS:  Has patient fallen in last 6 months? No and patient states she has had near falls but catches herself with hands on wall etc.    LIVING ENVIRONMENT: Lives with: lives alone Lives in: House/apartment Stairs:  stairs outside to basement and on back deck but hasn't done them in 15 years.  Also has attic in home but again has not done in many years.  Has following equipment at home: Quad cane small base, Environmental consultant - 2 wheeled, Environmental consultant - 4 wheeled, shower chair, and Grab bars  OCCUPATION: retired  PLOF: Independent with household mobility with device and Independent with homemaking with ambulation  PATIENT GOALS   -Primary improve strength of knee, try to decrease pain, more steady on feet.  -Secondary improve right shoulder strength, improve range of right shoulder, and decrease pain.    OBJECTIVE:   DIAGNOSTIC FINDINGS:  Impression:  Severe tricompartmental degenerative joint disease with marked bone on bone laterally  PATIENT SURVEYS:  FOTO 50  COGNITION:  Overall cognitive status: Within functional limits for tasks assessed     SENSATION: WFL  EDEMA:  In left knee; medial and proximal to patella    POSTURE: rounded shoulders, forward head, and increased thoracic kyphosis    LOWER EXTREMITY MMT:  MMT Right eval Left eval  Hip flexion 4 4  Hip extension 3 3  Hip abduction    Hip adduction    Hip internal rotation    Hip external rotation    Knee flexion    Knee extension 3 (pts knee gives in pain with cracking with minimal pressure) 3-  Ankle dorsiflexion 3 with minimal pressure knees start cracking) 3(with minimal pressure knees start cracking)  Ankle plantarflexion    Ankle inversion 3 3  Ankle eversion 3    (Blank rows = not tested)  LOWER EXTREMITY ROM:  AROM Right eval Left eval  Knee flexion 100 80  Knee extension -5 -20     UPPER EXTREMITY ROM:  Active ROM Right eval Left eval  Shoulder flexion 50 AROM 120 degrees and then start of pain   Shoulder extension    Shoulder abduction 60 degrees AROM  130 PROM    (Blank rows = not tested)  UPPER EXTREMITY MMT:  MMT Right eval Left eval  Shoulder flexion 3-   Shoulder extension    Shoulder abduction 3-   Elbow extension 3+   Wrist flexion 3+    (Blank rows = not tested)   FUNCTIONAL TESTS:  5 times sit to stand: 80.46 seconds with b/l UE support Timed up and go (TUG): to Left 60.79 seconds with NBQC  GAIT: Distance walked: 100' Assistive device utilized: Quad cane small base Level of assistance: SBA and CGA Comments: increwased trunk flexion,  decreased cadence, short step length, poor placement of cane on ground, decreased weightbearing on left LE  Sit to stand  B/L ue use at Close supervision, pt with large BOS and must come up in increments, does not get knees flexed enough   TODAY'S TREATMENT: 02/14/22 Pulley 2' each flexion and abduction Scapular retraction RTB 2 x 10 IR/ER 2 x 10 RTB  Trial of kinesiotape to Left knee for stability  Standing:  Heel toe raises x 10 Hip ext x 5 each March x 5 each Hip abduction x 5 each  02/12/22 UE pulleys into flexion and abd x2 mins Step up anterior x5 reps b/l onto 4 inch step Lateral step up 4 inch step x6 reps Attempted mini knee bends x5 Heel raises x15 Nustep level 1 x6 mins, half rom to begin into full rom Mini bridge x10 Supine marches x10 Mini crunch with UE tap to opposite knee x10 SLR x10    02/08/22 Seated:  LAQ 10X5" each LE AROM  Hip flexion 10X alternating bilaterally AROM  Sit to stand with less UE assist 5X Supine: Heelslides 10X each LE  Bridging with knees bent in available ROM  PVC pipe Rt UE flexion AAROM with Lt assist 5X  PVC pipe Rt UE Abduction AAROM with Lt assit 5X  Hip abduction 10X each  01/17/22 HEP established, goals reviewed Seated:  LAQ 10X5"  each LE  Hip flexion 10X alternating bilaterally Supine: Heelslides 10X each LE  Bridging with knees bent in available ROM  PVC pipe Rt UE flexion AAROM with Lt assist 5X  PVC pipe Rt UE Abduction AAROM with Lt assit 5X  PATIENT EDUCATION:  Education details: 6/14: Goals reviewed, HEP initiated; evaluation: Patient educated on exam findings, POC, scope of PT. Person educated: Patient Education method: Explanation, Demonstration, and Handouts Education comprehension: verbalized understanding, returned demonstration, verbal cues required, and tactile cues required    HOME EXERCISE PROGRAM: Access Code: [email or web address] URL: [email or web address] Date: 02/14/2022 Prepared by: [facility]  Exercises - Standing Hip Extension with Counter Support  - 2 x daily - 7 x weekly - 2 sets - 5 reps - Standing Hip Abduction with Counter Support  - 2 x daily - 7 x weekly - 2 sets - 5 reps - Standing March with Counter Support  - 2 x daily - 7 x weekly - 2 sets - 5 reps  Access Code: [email or web address] URL: [email or web address] Date: 01/17/2022 Prepared by: Roseanne Reno  Exercises - Seated Long Arc Quad  - 2 x daily - 7 x weekly - 2 sets - 10 reps - 5 seconds hold - Seated March  - 2 x daily - 7 x weekly - 2 sets - 10 reps - Supine Heel Slide  - 2 x daily - 7 x weekly - 2 sets - 10 reps - Bridge  - 2 x daily - 7 x weekly - 2 sets - 10 reps - Supine Shoulder Flexion with Dowel  - 2 x daily - 7 x weekly - 2 sets - 10 reps - Supine Shoulder Abduction AAROM with Dowel  - 2 x daily - 7 x weekly - 2 sets - 10 reps  ASSESSMENT:  CLINICAL IMPRESSION: Today's session focused on lower and upper  extremity strengthening and upper and lower extremity mobility. She continues with significant trouble getting up and down out of a chair. Uses her arms to push up a majority of the transfer.  Trial of kinesio tape to left knee for stability.  Patient will benefit from continued skilled therapy  interventions to address deficits and improve functional mobility.   OBJECTIVE IMPAIRMENTS Abnormal gait, decreased activity tolerance, decreased balance, decreased endurance, decreased knowledge of use of DME, decreased mobility, difficulty walking, decreased ROM, decreased strength, increased edema, increased fascial restrictions, impaired flexibility, impaired UE functional use, postural dysfunction, and pain.   ACTIVITY LIMITATIONS carrying, lifting, bending, standing, squatting, stairs, transfers, bed mobility, bathing, toileting, dressing, reach over head, and locomotion level  PARTICIPATION LIMITATIONS: cleaning, laundry, shopping, community activity, and yard work  PERSONAL FACTORS Age and 3+ comorbidities:    Allergies, Arthritis, Back pain, BMI over 30, Cancer, Hearing Impairment, High Blood Pressure, Osteoporosis, Prior Surgeryare also affecting patient's functional outcome.   REHAB POTENTIAL: Fair chronicity of impairment, multi areas affected  CLINICAL DECISION MAKING: Stable/uncomplicated  EVALUATION COMPLEXITY: High   GOALS: Goals reviewed with patient? Yes  SHORT TERM GOALS: Target date: 02/09/2022  Patient will be independent with HEP in order to improve functional outcomes. Baseline:  Goal status: IN PROGRESS  2.  Patient will report at least 25% improvement in symptoms for improved quality of life. Baseline:  Goal status: IN PROGRESS  3.  Patient will improve five time sit to stand by 20 seconds to work towards improved efficiency in transfer and demonstrate improving LE strength. Baseline: 5 times sit to stand: 80.46 seconds with b/l UE support Goal status: IN PROGRESS  4.  Pt will improve TUG by 10 seconds to demonstrate improving efficiency in ambulation Baseline:  Timed up and go (TUG): to Left 60.79 seconds with NBQC Goal status: IN PROGRESS  5.  Pt will improve right shoulder arom to 70 degrees to work towards improved functional use in adls Baseline:  50 degrees Goal status: IN PROGRESS    LONG TERM GOALS: Target date: 03/09/2022  1.  Patient will improve FOTO score by at least 10 points in order to indicate improved tolerance to activity. Baseline: 50 Goal status: IN PROGRESS  2. Patient will improve five time sit to stand by 40 seconds to work towards improved efficiency in transfer and demonstrate improving LE strength. Baseline: 5 times sit to stand: 80.46 seconds with b/l UE support Goal status: IN PROGRESS  4.  Pt will improve TUG by 20 seconds to demonstrate improving efficiency in ambulation Baseline:  Timed up and go (TUG): to Left 60.79 seconds with NBQC Goal status: IN PROGRESS  5.  Pt will improve right shoulder arom to 90 degrees to work towards improved functional use in adls Baseline: 50 degrees Goal status: IN PROGRESS    PLAN: PT FREQUENCY: 2x/week  PT DURATION: 8 weeks  PLANNED INTERVENTIONS: PLANNED INTERVENTIONS: Therapeutic exercises, Therapeutic activity, Neuromuscular re-education, Balance training, Gait training, Patient/Family education, Joint manipulation, Joint mobilization, Stair training, Orthotic/Fit training, DME instructions, Aquatic Therapy, Dry Needling, Electrical stimulation, Spinal manipulation, Spinal mobilization, Cryotherapy, Moist heat, Compression bandaging, scar mobilization, Splintting, Taping, Traction, Ultrasound, Ionotophoresis '4mg'$ /ml Dexamethasone, and Manual therapy   PLAN FOR NEXT SESSION: continue to build right shoulder strength and ROM as well as B/L LE strength and stretching.     3:23 PM, 02/14/22 Lisa Klein MPT [facility] [place] [phone]

## 2022-02-19 ENCOUNTER — Encounter (HOSPITAL_COMMUNITY): Payer: Self-pay

## 2022-02-19 ENCOUNTER — Ambulatory Visit (HOSPITAL_COMMUNITY): Payer: Medicare HMO

## 2022-02-19 DIAGNOSIS — R2689 Other abnormalities of gait and mobility: Secondary | ICD-10-CM | POA: Diagnosis not present

## 2022-02-19 DIAGNOSIS — M6281 Muscle weakness (generalized): Secondary | ICD-10-CM

## 2022-02-19 DIAGNOSIS — G8929 Other chronic pain: Secondary | ICD-10-CM

## 2022-02-19 DIAGNOSIS — M25511 Pain in right shoulder: Secondary | ICD-10-CM | POA: Diagnosis not present

## 2022-02-19 DIAGNOSIS — M25562 Pain in left knee: Secondary | ICD-10-CM | POA: Diagnosis not present

## 2022-02-19 NOTE — Therapy (Signed)
OUTPATIENT PHYSICAL THERAPY TREATMENT  Patient Name: JUPITER BOYS MRN: [id number] DOB:Dec 29, 1929, 88 y.o., female Today's Date: 02/19/2022   PT End of Session - 02/19/22 1351     Visit Number 6    Number of Visits 16    Date for PT Re-Evaluation 03/09/22    Authorization Type [employer] H[employer] (auth required, no visit limit based on med necessity) (please check)    Authorization Time Period 12 visits approved 6/14-7/27    Authorization - Visit Number 6    Authorization - Number of Visits 12    Progress Note Due on Visit 12    PT Start Time 8144    PT Stop Time 1430    PT Time Calculation (min) 42 min               Past Medical History:  Diagnosis Date   Cancer ([place]) 2012   LEFT BREAST   Clotting disorder ([place]) 05/2011   dvt, right leg   Degenerative disc disease    with nerve compression    DJD (degenerative joint disease)    Hypertension    Hypertension    Hypothyroidism    Hypothyroidism    Long term current use of anticoagulant therapy 12/07/2014   Pulmonary embolus    Obesity    Osteopenia 08/24/2014   Past Surgical History:  Procedure Laterality Date   ABDOMINAL HYSTERECTOMY  1980   fibroids   BREAST SURGERY  2012   left total mastectomy   CHOLECYSTECTOMY  80'S   [facility]   CHOLECYSTECTOMY     HIP PINNING,CANNULATED Left 01/19/2013   Procedure: CANNULATED HIP PINNING-  left;  Surgeon: Marin Shutter, MD;  Location: [facility];  Service: Orthopedics;  Laterality: Left;   MASTECTOMY MODIFIED RADICAL  02/14/11   left   PORT-A-CATH REMOVAL N/A 12/05/2012   Procedure: MINOR REMOVAL PORT-A-CATH;  Surgeon: Jamesetta So, MD;  Location: [facility];  Service: General;  Laterality: N/A;  In Minor Room   PORTACATH PLACEMENT  04/16/2011   Procedure: INSERTION PORT-A-CATH;  Surgeon: Jamesetta So;  Location: [facility];  Service: General;  Laterality: Right;  right subclavian   VESICOVAGINAL FISTULA CLOSURE W/ TAH     [facility]   Patient Active Problem List   Diagnosis Date Noted    Bilateral shoulder pain 12/19/2021   Fatigue 03/27/2021   Shortness of breath 03/27/2021   GERD (gastroesophageal reflux disease) 03/27/2021   Knee pain, left 02/15/2020   Generalized osteoarthritis of multiple sites 04/02/2018   Allergic rhinitis 09/01/2015   Obesity 12/13/2014   Long term current use of anticoagulant therapy 12/07/2014   Osteopenia 08/24/2014   Infiltrating ductal carcinoma of breast ([place]) 04/10/2011   Hyperlipemia 11/07/2007   Hypothyroidism 08/15/2007   Essential hypertension 08/15/2007    PCP: [name] MD  REFERRING PROVIDER: [name] MD  REFERRING DIAG: M25.511,G89.29,M25.512 (ICD-10-CM) - Chronic pain of both shoulders M25.562,G89.29 (ICD-10-CM) - Chronic pain of left knee   THERAPY DIAG:  Chronic pain of left knee  Other abnormalities of gait and mobility  Chronic right shoulder pain  Muscle weakness (generalized)  Rationale for Evaluation and Treatment Rehabilitation  ONSET DATE: approximately 2013  SUBJECTIVE:   SUBJECTIVE STATEMENT: Patient with no new reports, legs and shoulders continue to give pt pain.   PERTINENT HISTORY:  Right shoulder soreness and limited AROM, left arm soreness but more ROM  Left knee pain with genu valgum  Severe arthritis There is severe tricompartmental degenerative joint disease of the  left knee with marked bone on bone and osteophytes more laterally and knock-knee deformity present.  No fracture or loose body noted.  Bone quality is good.  Breast cancer in 2012  Knee pain, left Increased pain , stiffness and instability    Bilateral shoulder pain Pain , stiffness and reduced shoulder mobility right greater than left  History of left hip fx with ORIF       PAIN:  Are you having pain? Yes: NPRS scale: Right shoulder 5/10 knee at worst 10/10 currently 7/10 Pain location: right shoulder and left knee Pain description: shoulder aching. Knee aching Aggravating factors: standing and  getting up or using arm in adls Relieving factors: massaging area  PRECAUTIONS: None  WEIGHT BEARING RESTRICTIONS No  FALLS:  Has patient fallen in last 6 months? No and patient states she has had near falls but catches herself with hands on wall etc.    LIVING ENVIRONMENT: Lives with: lives alone Lives in: House/apartment Stairs:  stairs outside to basement and on back deck but hasn't done them in 15 years.  Also has attic in home but again has not done in many years.  Has following equipment at home: Quad cane small base, Environmental consultant - 2 wheeled, Environmental consultant - 4 wheeled, shower chair, and Grab bars  OCCUPATION: retired  PLOF: Independent with household mobility with device and Independent with homemaking with ambulation  PATIENT GOALS   -Primary improve strength of knee, try to decrease pain, more steady on feet.  -Secondary improve right shoulder strength, improve range of right shoulder, and decrease pain.    OBJECTIVE:   DIAGNOSTIC FINDINGS:  Impression:  Severe tricompartmental degenerative joint disease with marked bone on bone laterally  PATIENT SURVEYS:  FOTO 50  COGNITION:  Overall cognitive status: Within functional limits for tasks assessed     SENSATION: WFL  EDEMA:  In left knee; medial and proximal to patella    POSTURE: rounded shoulders, forward head, and increased thoracic kyphosis    LOWER EXTREMITY MMT:  MMT Right eval Left eval  Hip flexion 4 4  Hip extension 3 3  Hip abduction    Hip adduction    Hip internal rotation    Hip external rotation    Knee flexion    Knee extension 3 (pts knee gives in pain with cracking with minimal pressure) 3-  Ankle dorsiflexion 3 with minimal pressure knees start cracking) 3(with minimal pressure knees start cracking)  Ankle plantarflexion    Ankle inversion 3 3  Ankle eversion 3    (Blank rows = not tested)  LOWER EXTREMITY ROM:  AROM Right eval Left eval  Knee flexion 100 80  Knee extension -5 -20     UPPER EXTREMITY ROM:  Active ROM Right eval Left eval  Shoulder flexion 50 AROM 120 degrees and then start of pain   Shoulder extension    Shoulder abduction 60 degrees AROM  130 PROM    (Blank rows = not tested)  UPPER EXTREMITY MMT:  MMT Right eval Left eval  Shoulder flexion 3-   Shoulder extension    Shoulder abduction 3-   Elbow extension 3+   Wrist flexion 3+    (Blank rows = not tested)   FUNCTIONAL TESTS:  5 times sit to stand: 80.46 seconds with b/l UE support Timed up and go (TUG): to Left 60.79 seconds with NBQC  GAIT: Distance walked: 100' Assistive device utilized: Quad cane small base Level of assistance: SBA and CGA Comments: increwased trunk  flexion, decreased cadence, short step length, poor placement of cane on ground, decreased weightbearing on left LE  Sit to stand  B/L ue use at Close supervision, pt with large BOS and must come up in increments, does not get knees flexed enough   TODAY'S TREATMENT: 02/19/22 Nustep level 1 x5 mins with b/l UE/LE kinesiotape to Left knee for stability Step up anterior x10 reps b/l onto 4 inch step Lateral step up 4 inch step x10 reps Side steps in ll bars with yellow theraband resistance mid calf in //s 6 laps Walking march b/l UE support x6laps Pull to stand at // bars with 2 pillows and foam on top of chair 5 reps x2 sets, rest between  02/14/22 Pulley 2' each flexion and abduction Scapular retraction RTB 2 x 10 IR/ER 2 x 10 RTB  Trial of kinesiotape to Left knee for stability  Standing:  Heel toe raises x 10 Hip ext x 5 each March x 5 each Hip abduction x 5 each  02/12/22 UE pulleys into flexion and abd x2 mins Step up anterior x5 reps b/l onto 4 inch step Lateral step up 4 inch step x6 reps Attempted mini knee bends x5 Heel raises x15 Nustep level 1 x6 mins, half rom to begin into full rom Mini bridge x10 Supine marches x10 Mini crunch with UE tap to opposite knee x10 SLR x10     02/08/22 Seated:  LAQ 10X5" each LE AROM  Hip flexion 10X alternating bilaterally AROM  Sit to stand with less UE assist 5X Supine: Heelslides 10X each LE  Bridging with knees bent in available ROM  PVC pipe Rt UE flexion AAROM with Lt assist 5X  PVC pipe Rt UE Abduction AAROM with Lt assit 5X  Hip abduction 10X each  01/17/22 HEP established, goals reviewed Seated:  LAQ 10X5" each LE  Hip flexion 10X alternating bilaterally Supine: Heelslides 10X each LE  Bridging with knees bent in available ROM  PVC pipe Rt UE flexion AAROM with Lt assist 5X  PVC pipe Rt UE Abduction AAROM with Lt assit 5X  PATIENT EDUCATION:  Education details: 6/14: Goals reviewed, HEP initiated; evaluation: Patient educated on exam findings, POC, scope of PT. Person educated: Patient Education method: Explanation, Demonstration, and Handouts Education comprehension: verbalized understanding, returned demonstration, verbal cues required, and tactile cues required    HOME EXERCISE PROGRAM: Access Code: [email or web address] URL: [email or web address] Date: 02/14/2022 Prepared by: [facility]  Exercises - Standing Hip Extension with Counter Support  - 2 x daily - 7 x weekly - 2 sets - 5 reps - Standing Hip Abduction with Counter Support  - 2 x daily - 7 x weekly - 2 sets - 5 reps - Standing March with Counter Support  - 2 x daily - 7 x weekly - 2 sets - 5 reps  Access Code: [email or web address] URL: [email or web address] Date: 01/17/2022 Prepared by: Roseanne Reno  Exercises - Seated Long Arc Quad  - 2 x daily - 7 x weekly - 2 sets - 10 reps - 5 seconds hold - Seated March  - 2 x daily - 7 x weekly - 2 sets - 10 reps - Supine Heel Slide  - 2 x daily - 7 x weekly - 2 sets - 10 reps - Bridge  - 2 x daily - 7 x weekly - 2 sets - 10 reps - Supine Shoulder Flexion with Dowel  - 2 x daily - 7 x weekly - 2 sets - 10 reps -  Supine Shoulder Abduction AAROM with Dowel  - 2 x daily - 7 x weekly - 2 sets -  10 reps  ASSESSMENT:  CLINICAL IMPRESSION: Today's session focused on LE strength and Increasing knee flexion/extension. She continues with significant trouble getting up and down out of a chair. Uses her arms to push up a majority of the transfer. Pt reports that she felt support from k-taps, reapplied tape in buttress around knee. Patient with improving speed of movement with more time spent on feet in side steps. Pt with limited to no knee flexion and extension in step ups and hoists up through arms.  Patient will benefit from continued skilled therapy interventions to address deficits and improve functional mobility.   OBJECTIVE IMPAIRMENTS Abnormal gait, decreased activity tolerance, decreased balance, decreased endurance, decreased knowledge of use of DME, decreased mobility, difficulty walking, decreased ROM, decreased strength, increased edema, increased fascial restrictions, impaired flexibility, impaired UE functional use, postural dysfunction, and pain.   ACTIVITY LIMITATIONS carrying, lifting, bending, standing, squatting, stairs, transfers, bed mobility, bathing, toileting, dressing, reach over head, and locomotion level  PARTICIPATION LIMITATIONS: cleaning, laundry, shopping, community activity, and yard work  PERSONAL FACTORS Age and 3+ comorbidities:    Allergies, Arthritis, Back pain, BMI over 30, Cancer, Hearing Impairment, High Blood Pressure, Osteoporosis, Prior Surgeryare also affecting patient's functional outcome.   REHAB POTENTIAL: Fair chronicity of impairment, multi areas affected  CLINICAL DECISION MAKING: Stable/uncomplicated  EVALUATION COMPLEXITY: High   GOALS: Goals reviewed with patient? Yes  SHORT TERM GOALS: Target date: 02/09/2022  Patient will be independent with HEP in order to improve functional outcomes. Baseline:  Goal status: IN PROGRESS  2.  Patient will report at least 25% improvement in symptoms for improved quality of life. Baseline:  Goal  status: IN PROGRESS  3.  Patient will improve five time sit to stand by 20 seconds to work towards improved efficiency in transfer and demonstrate improving LE strength. Baseline: 5 times sit to stand: 80.46 seconds with b/l UE support Goal status: IN PROGRESS  4.  Pt will improve TUG by 10 seconds to demonstrate improving efficiency in ambulation Baseline:  Timed up and go (TUG): to Left 60.79 seconds with NBQC Goal status: IN PROGRESS  5.  Pt will improve right shoulder arom to 70 degrees to work towards improved functional use in adls Baseline: 50 degrees Goal status: IN PROGRESS    LONG TERM GOALS: Target date: 03/09/2022  1.  Patient will improve FOTO score by at least 10 points in order to indicate improved tolerance to activity. Baseline: 50 Goal status: IN PROGRESS  2. Patient will improve five time sit to stand by 40 seconds to work towards improved efficiency in transfer and demonstrate improving LE strength. Baseline: 5 times sit to stand: 80.46 seconds with b/l UE support Goal status: IN PROGRESS  4.  Pt will improve TUG by 20 seconds to demonstrate improving efficiency in ambulation Baseline:  Timed up and go (TUG): to Left 60.79 seconds with NBQC Goal status: IN PROGRESS  5.  Pt will improve right shoulder arom to 90 degrees to work towards improved functional use in adls Baseline: 50 degrees Goal status: IN PROGRESS    PLAN: PT FREQUENCY: 2x/week  PT DURATION: 8 weeks  PLANNED INTERVENTIONS: PLANNED INTERVENTIONS: Therapeutic exercises, Therapeutic activity, Neuromuscular re-education, Balance training, Gait training, Patient/Family education, Joint manipulation, Joint mobilization, Stair training, Orthotic/Fit training, DME instructions, Aquatic Therapy, Dry Needling, Electrical stimulation, Spinal manipulation, Spinal mobilization, Cryotherapy, Moist heat, Compression bandaging,  scar mobilization, Splintting, Taping, Traction, Ultrasound, Ionotophoresis  '4mg'$ /ml Dexamethasone, and Manual therapy   PLAN FOR NEXT SESSION: continue to build right shoulder strength and ROM as well as B/L LE strength and stretching.     3:44 PM, 02/19/22 Kayode Petion PT, DPT

## 2022-02-21 ENCOUNTER — Ambulatory Visit (HOSPITAL_COMMUNITY): Payer: Medicare HMO

## 2022-02-21 DIAGNOSIS — M6281 Muscle weakness (generalized): Secondary | ICD-10-CM

## 2022-02-21 DIAGNOSIS — M25562 Pain in left knee: Secondary | ICD-10-CM | POA: Diagnosis not present

## 2022-02-21 DIAGNOSIS — R2689 Other abnormalities of gait and mobility: Secondary | ICD-10-CM | POA: Diagnosis not present

## 2022-02-21 DIAGNOSIS — G8929 Other chronic pain: Secondary | ICD-10-CM | POA: Diagnosis not present

## 2022-02-21 DIAGNOSIS — M25511 Pain in right shoulder: Secondary | ICD-10-CM | POA: Diagnosis not present

## 2022-02-21 NOTE — Therapy (Signed)
OUTPATIENT PHYSICAL THERAPY TREATMENT  Patient Name: Lisa Klein MRN: [id number] DOB:1930-04-25, 88 y.o., female Today's Date: 02/21/2022   PT End of Session - 02/21/22 1108     Visit Number 7    Number of Visits 16    Date for PT Re-Evaluation 03/09/22    Authorization Type [employer] H[employer] (auth required, no visit limit based on med necessity) (please check)    Authorization Time Period 12 visits approved 6/14-7/27    Authorization - Visit Number 7    Authorization - Number of Visits 12    Progress Note Due on Visit 12    PT Start Time 1105    PT Stop Time 0865    PT Time Calculation (min) 46 min                Past Medical History:  Diagnosis Date   Cancer ([place]) 2012   LEFT BREAST   Clotting disorder ([place]) 05/2011   dvt, right leg   Degenerative disc disease    with nerve compression    DJD (degenerative joint disease)    Hypertension    Hypertension    Hypothyroidism    Hypothyroidism    Long term current use of anticoagulant therapy 12/07/2014   Pulmonary embolus    Obesity    Osteopenia 08/24/2014   Past Surgical History:  Procedure Laterality Date   ABDOMINAL HYSTERECTOMY  1980   fibroids   BREAST SURGERY  2012   left total mastectomy   CHOLECYSTECTOMY  80'S   [facility]   CHOLECYSTECTOMY     HIP PINNING,CANNULATED Left 01/19/2013   Procedure: CANNULATED HIP PINNING-  left;  Surgeon: Marin Shutter, MD;  Location: [facility];  Service: Orthopedics;  Laterality: Left;   MASTECTOMY MODIFIED RADICAL  02/14/11   left   PORT-A-CATH REMOVAL N/A 12/05/2012   Procedure: MINOR REMOVAL PORT-A-CATH;  Surgeon: Jamesetta So, MD;  Location: [facility];  Service: General;  Laterality: N/A;  In Minor Room   PORTACATH PLACEMENT  04/16/2011   Procedure: INSERTION PORT-A-CATH;  Surgeon: Jamesetta So;  Location: [facility];  Service: General;  Laterality: Right;  right subclavian   VESICOVAGINAL FISTULA CLOSURE W/ TAH     [facility]   Patient Active Problem List   Diagnosis Date  Noted   Bilateral shoulder pain 12/19/2021   Fatigue 03/27/2021   Shortness of breath 03/27/2021   GERD (gastroesophageal reflux disease) 03/27/2021   Knee pain, left 02/15/2020   Generalized osteoarthritis of multiple sites 04/02/2018   Allergic rhinitis 09/01/2015   Obesity 12/13/2014   Long term current use of anticoagulant therapy 12/07/2014   Osteopenia 08/24/2014   Infiltrating ductal carcinoma of breast ([place]) 04/10/2011   Hyperlipemia 11/07/2007   Hypothyroidism 08/15/2007   Essential hypertension 08/15/2007    PCP: [name] MD  REFERRING PROVIDER: [name] MD  REFERRING DIAG: M25.511,G89.29,M25.512 (ICD-10-CM) - Chronic pain of both shoulders M25.562,G89.29 (ICD-10-CM) - Chronic pain of left knee   THERAPY DIAG:  Chronic pain of left knee  Other abnormalities of gait and mobility  Chronic right shoulder pain  Muscle weakness (generalized)  Rationale for Evaluation and Treatment Rehabilitation  ONSET DATE: approximately 2013  SUBJECTIVE:   SUBJECTIVE STATEMENT: Tape helps  some but knees still are quite painful; maybe a tad better getting up out of a chair.  PERTINENT HISTORY:  Right shoulder soreness and limited AROM, left arm soreness but more ROM  Left knee pain with genu valgum  Severe arthritis There is  severe tricompartmental degenerative joint disease of the left knee with marked bone on bone and osteophytes more laterally and knock-knee deformity present.  No fracture or loose body noted.  Bone quality is good.  Breast cancer in 2012  Knee pain, left Increased pain , stiffness and instability    Bilateral shoulder pain Pain , stiffness and reduced shoulder mobility right greater than left  History of left hip fx with ORIF       PAIN:  Are you having pain? Yes: NPRS scale: Right shoulder 3/10 knee at worst 10/10 currently 8-9/10 Pain location: right shoulder and left knee Pain description: shoulder aching. Knee  aching Aggravating factors: standing and getting up or using arm in adls Relieving factors: massaging area  PRECAUTIONS: None  WEIGHT BEARING RESTRICTIONS No  FALLS:  Has patient fallen in last 6 months? No and patient states she has had near falls but catches herself with hands on wall etc.    LIVING ENVIRONMENT: Lives with: lives alone Lives in: House/apartment Stairs:  stairs outside to basement and on back deck but hasn't done them in 15 years.  Also has attic in home but again has not done in many years.  Has following equipment at home: Quad cane small base, Environmental consultant - 2 wheeled, Environmental consultant - 4 wheeled, shower chair, and Grab bars  OCCUPATION: retired  PLOF: Independent with household mobility with device and Independent with homemaking with ambulation  PATIENT GOALS   -Primary improve strength of knee, try to decrease pain, more steady on feet.  -Secondary improve right shoulder strength, improve range of right shoulder, and decrease pain.    OBJECTIVE:   DIAGNOSTIC FINDINGS:  Impression:  Severe tricompartmental degenerative joint disease with marked bone on bone laterally  PATIENT SURVEYS:  FOTO 50  COGNITION:  Overall cognitive status: Within functional limits for tasks assessed     SENSATION: WFL  EDEMA:  In left knee; medial and proximal to patella    POSTURE: rounded shoulders, forward head, and increased thoracic kyphosis    LOWER EXTREMITY MMT:  MMT Right eval Left eval  Hip flexion 4 4  Hip extension 3 3  Hip abduction    Hip adduction    Hip internal rotation    Hip external rotation    Knee flexion    Knee extension 3 (pts knee gives in pain with cracking with minimal pressure) 3-  Ankle dorsiflexion 3 with minimal pressure knees start cracking) 3(with minimal pressure knees start cracking)  Ankle plantarflexion    Ankle inversion 3 3  Ankle eversion 3    (Blank rows = not tested)  LOWER EXTREMITY ROM:  AROM Right eval Left eval   Knee flexion 100 80  Knee extension -5 -20    UPPER EXTREMITY ROM:  Active ROM Right eval Left eval  Shoulder flexion 50 AROM 120 degrees and then start of pain   Shoulder extension    Shoulder abduction 60 degrees AROM  130 PROM    (Blank rows = not tested)  UPPER EXTREMITY MMT:  MMT Right eval Left eval  Shoulder flexion 3-   Shoulder extension    Shoulder abduction 3-   Elbow extension 3+   Wrist flexion 3+    (Blank rows = not tested)   FUNCTIONAL TESTS:  5 times sit to stand: 80.46 seconds with b/l UE support Timed up and go (TUG): to Left 60.79 seconds with NBQC  GAIT: Distance walked: 100' Assistive device utilized: Lobbyist Level of  assistance: SBA and CGA Comments: increwased trunk flexion, decreased cadence, short step length, poor placement of cane on ground, decreased weightbearing on left LE  Sit to stand  B/L ue use at Close supervision, pt with large BOS and must come up in increments, does not get knees flexed enough   TODAY'S TREATMENT: 02/21/22 Seated: Pulley 2' each flexion and abduction Scapular retraction 2 x 10 Cervical retraction 2 x 10 IR/ER 2 x 10 RTB bilat  Sit to stand x 5 1 min and 3 sec (63 sec)  Nustep seat 10, arms 9 x 7 min for mobility and endurance  Standing: Marching x 10 Heel raises x 10      02/19/22 Nustep level 1 x5 mins with b/l UE/LE kinesiotape to Left knee for stability Step up anterior x10 reps b/l onto 4 inch step Lateral step up 4 inch step x10 reps Side steps in ll bars with yellow theraband resistance mid calf in //s 6 laps Walking march b/l UE support x6laps Pull to stand at // bars with 2 pillows and foam on top of chair 5 reps x2 sets, rest between  02/14/22 Pulley 2' each flexion and abduction Scapular retraction RTB 2 x 10 IR/ER 2 x 10 RTB  Trial of kinesiotape to Left knee for stability  Standing:  Heel toe raises x 10 Hip ext x 5 each March x 5 each Hip abduction x 5  each  02/12/22 UE pulleys into flexion and abd x2 mins Step up anterior x5 reps b/l onto 4 inch step Lateral step up 4 inch step x6 reps Attempted mini knee bends x5 Heel raises x15 Nustep level 1 x6 mins, half rom to begin into full rom Mini bridge x10 Supine marches x10 Mini crunch with UE tap to opposite knee x10 SLR x10    02/08/22 Seated:  LAQ 10X5" each LE AROM  Hip flexion 10X alternating bilaterally AROM  Sit to stand with less UE assist 5X Supine: Heelslides 10X each LE  Bridging with knees bent in available ROM  PVC pipe Rt UE flexion AAROM with Lt assist 5X  PVC pipe Rt UE Abduction AAROM with Lt assit 5X  Hip abduction 10X each  01/17/22 HEP established, goals reviewed Seated:  LAQ 10X5" each LE  Hip flexion 10X alternating bilaterally Supine: Heelslides 10X each LE  Bridging with knees bent in available ROM  PVC pipe Rt UE flexion AAROM with Lt assist 5X  PVC pipe Rt UE Abduction AAROM with Lt assit 5X  PATIENT EDUCATION:  Education details: 6/14: Goals reviewed, HEP initiated; evaluation: Patient educated on exam findings, POC, scope of PT. Person educated: Patient Education method: Explanation, Demonstration, and Handouts Education comprehension: verbalized understanding, returned demonstration, verbal cues required, and tactile cues required    HOME EXERCISE PROGRAM: Access Code: [email or web address] URL: [email or web address] Date: 02/14/2022 Prepared by: [facility]  Exercises - Standing Hip Extension with Counter Support  - 2 x daily - 7 x weekly - 2 sets - 5 reps - Standing Hip Abduction with Counter Support  - 2 x daily - 7 x weekly - 2 sets - 5 reps - Standing March with Counter Support  - 2 x daily - 7 x weekly - 2 sets - 5 reps  Access Code: [email or web address] URL: [email or web address] Date: 01/17/2022 Prepared by: Roseanne Reno  Exercises - Seated Long Arc Quad  - 2 x daily - 7 x weekly - 2 sets - 10 reps - 5 seconds hold - Seated  March  -  2 x daily - 7 x weekly - 2 sets - 10 reps - Supine Heel Slide  - 2 x daily - 7 x weekly - 2 sets - 10 reps - Bridge  - 2 x daily - 7 x weekly - 2 sets - 10 reps - Supine Shoulder Flexion with Dowel  - 2 x daily - 7 x weekly - 2 sets - 10 reps - Supine Shoulder Abduction AAROM with Dowel  - 2 x daily - 7 x weekly - 2 sets - 10 reps  ASSESSMENT:  CLINICAL IMPRESSION: Today's session focused on LE strength and Increasing knee flexion/extension. She continues with significant trouble getting up and down out of a chair. Uses her arms to push up a majority of the transfer but able to use her legs more today.. Patient with improved core on the 30 sec sit to stand test today demonstrating improved functional mobility and increased lower extremity strength. Patient will benefit from continued skilled therapy interventions to address deficits and improve functional mobility.   OBJECTIVE IMPAIRMENTS Abnormal gait, decreased activity tolerance, decreased balance, decreased endurance, decreased knowledge of use of DME, decreased mobility, difficulty walking, decreased ROM, decreased strength, increased edema, increased fascial restrictions, impaired flexibility, impaired UE functional use, postural dysfunction, and pain.   ACTIVITY LIMITATIONS carrying, lifting, bending, standing, squatting, stairs, transfers, bed mobility, bathing, toileting, dressing, reach over head, and locomotion level  PARTICIPATION LIMITATIONS: cleaning, laundry, shopping, community activity, and yard work  PERSONAL FACTORS Age and 3+ comorbidities:    Allergies, Arthritis, Back pain, BMI over 30, Cancer, Hearing Impairment, High Blood Pressure, Osteoporosis, Prior Surgeryare also affecting patient's functional outcome.   REHAB POTENTIAL: Fair chronicity of impairment, multi areas affected  CLINICAL DECISION MAKING: Stable/uncomplicated  EVALUATION COMPLEXITY: High   GOALS: Goals reviewed with patient? Yes  SHORT  TERM GOALS: Target date: 02/09/2022  Patient will be independent with HEP in order to improve functional outcomes. Baseline:  Goal status: IN PROGRESS  2.  Patient will report at least 25% improvement in symptoms for improved quality of life. Baseline:  Goal status: IN PROGRESS  3.  Patient will improve five time sit to stand by 20 seconds to work towards improved efficiency in transfer and demonstrate improving LE strength. Baseline: 5 times sit to stand: 80.46 seconds with b/l UE support; 02/21/22 63 sec Goal status: IN PROGRESS  4.  Pt will improve TUG by 10 seconds to demonstrate improving efficiency in ambulation Baseline:  Timed up and go (TUG): to Left 60.79 seconds with NBQC Goal status: IN PROGRESS  5.  Pt will improve right shoulder arom to 70 degrees to work towards improved functional use in adls Baseline: 50 degrees Goal status: IN PROGRESS    LONG TERM GOALS: Target date: 03/09/2022  1.  Patient will improve FOTO score by at least 10 points in order to indicate improved tolerance to activity. Baseline: 50 Goal status: IN PROGRESS  2. Patient will improve five time sit to stand by 40 seconds to work towards improved efficiency in transfer and demonstrate improving LE strength. Baseline: 5 times sit to stand: 80.46 seconds with b/l UE support Goal status: IN PROGRESS  4.  Pt will improve TUG by 20 seconds to demonstrate improving efficiency in ambulation Baseline:  Timed up and go (TUG): to Left 60.79 seconds with NBQC Goal status: IN PROGRESS  5.  Pt will improve right shoulder arom to 90 degrees to work towards improved functional use in adls Baseline: 50 degrees Goal status:  IN PROGRESS    PLAN: PT FREQUENCY: 2x/week  PT DURATION: 8 weeks  PLANNED INTERVENTIONS: PLANNED INTERVENTIONS: Therapeutic exercises, Therapeutic activity, Neuromuscular re-education, Balance training, Gait training, Patient/Family education, Joint manipulation, Joint mobilization,  Stair training, Orthotic/Fit training, DME instructions, Aquatic Therapy, Dry Needling, Electrical stimulation, Spinal manipulation, Spinal mobilization, Cryotherapy, Moist heat, Compression bandaging, scar mobilization, Splintting, Taping, Traction, Ultrasound, Ionotophoresis '4mg'$ /ml Dexamethasone, and Manual therapy   PLAN FOR NEXT SESSION: continue to build right shoulder strength and ROM as well as B/L LE strength and stretching.     11:53 AM, 02/21/22 Dian Minahan Small Melvenia Favela MPT [facility] [place] [phone]

## 2022-02-27 ENCOUNTER — Ambulatory Visit (HOSPITAL_COMMUNITY): Payer: Medicare HMO

## 2022-02-27 DIAGNOSIS — M25562 Pain in left knee: Secondary | ICD-10-CM

## 2022-02-27 DIAGNOSIS — R2689 Other abnormalities of gait and mobility: Secondary | ICD-10-CM | POA: Diagnosis not present

## 2022-02-27 DIAGNOSIS — M6281 Muscle weakness (generalized): Secondary | ICD-10-CM | POA: Diagnosis not present

## 2022-02-27 DIAGNOSIS — G8929 Other chronic pain: Secondary | ICD-10-CM

## 2022-02-27 DIAGNOSIS — M25511 Pain in right shoulder: Secondary | ICD-10-CM | POA: Diagnosis not present

## 2022-02-27 NOTE — Therapy (Signed)
OUTPATIENT PHYSICAL THERAPY TREATMENT  Patient Name: Lisa Klein MRN: [id number] DOB:11/05/1929, 88 y.o., female Today's Date: 02/27/2022   PT End of Session - 02/27/22 1302     Visit Number 8    Number of Visits 16    Date for PT Re-Evaluation 03/09/22    Authorization Type [employer] H[employer] (auth required, no visit limit based on med necessity) (please check)    Authorization Time Period 12 visits approved 6/14-7/27    Authorization - Visit Number 8    Authorization - Number of Visits 12    Progress Note Due on Visit 12    PT Start Time 1303    PT Stop Time 0867    PT Time Calculation (min) 40 min                 Past Medical History:  Diagnosis Date   Cancer ([place]) 2012   LEFT BREAST   Clotting disorder ([employer]) 05/2011   dvt, right leg   Degenerative disc disease    with nerve compression    DJD (degenerative joint disease)    Hypertension    Hypertension    Hypothyroidism    Hypothyroidism    Long term current use of anticoagulant therapy 12/07/2014   Pulmonary embolus    Obesity    Osteopenia 08/24/2014   Past Surgical History:  Procedure Laterality Date   ABDOMINAL HYSTERECTOMY  1980   fibroids   BREAST SURGERY  2012   left total mastectomy   CHOLECYSTECTOMY  80'S   [facility]   CHOLECYSTECTOMY     HIP PINNING,CANNULATED Left 01/19/2013   Procedure: CANNULATED HIP PINNING-  left;  Surgeon: Marin Shutter, MD;  Location: [place];  Service: Orthopedics;  Laterality: Left;   MASTECTOMY MODIFIED RADICAL  02/14/11   left   PORT-A-CATH REMOVAL N/A 12/05/2012   Procedure: MINOR REMOVAL PORT-A-CATH;  Surgeon: Jamesetta So, MD;  Location: [facility];  Service: General;  Laterality: N/A;  In Minor Room   PORTACATH PLACEMENT  04/16/2011   Procedure: INSERTION PORT-A-CATH;  Surgeon: Jamesetta So;  Location: [facility];  Service: General;  Laterality: Right;  right subclavian   VESICOVAGINAL FISTULA CLOSURE W/ TAH     [facility]   Patient Active Problem List   Diagnosis Date  Noted   Bilateral shoulder pain 12/19/2021   Fatigue 03/27/2021   Shortness of breath 03/27/2021   GERD (gastroesophageal reflux disease) 03/27/2021   Knee pain, left 02/15/2020   Generalized osteoarthritis of multiple sites 04/02/2018   Allergic rhinitis 09/01/2015   Obesity 12/13/2014   Long term current use of anticoagulant therapy 12/07/2014   Osteopenia 08/24/2014   Infiltrating ductal carcinoma of breast ([place]) 04/10/2011   Hyperlipemia 11/07/2007   Hypothyroidism 08/15/2007   Essential hypertension 08/15/2007    PCP: [name] MD  REFERRING PROVIDER: [name] MD  REFERRING DIAG: M25.511,G89.29,M25.512 (ICD-10-CM) - Chronic pain of both shoulders M25.562,G89.29 (ICD-10-CM) - Chronic pain of left knee   THERAPY DIAG:  Chronic pain of left knee  Other abnormalities of gait and mobility  Chronic right shoulder pain  Muscle weakness (generalized)  Rationale for Evaluation and Treatment Rehabilitation  ONSET DATE: approximately 2013  SUBJECTIVE:   SUBJECTIVE STATEMENT: Patient knees are quite stiff today; a little more sore than at last visit. Knees are sore; left shoulder more sore than right.   PERTINENT HISTORY:  Right shoulder soreness and limited AROM, left arm soreness but more ROM  Left knee pain with genu valgum  Severe arthritis There is severe tricompartmental degenerative joint disease of the left knee with marked bone on bone and osteophytes more laterally and knock-knee deformity present.  No fracture or loose body noted.  Bone quality is good.  Breast cancer in 2012  Knee pain, left Increased pain , stiffness and instability    Bilateral shoulder pain Pain , stiffness and reduced shoulder mobility right greater than left  History of left hip fx with ORIF       PAIN:  Are you having pain? Yes: NPRS scale: left shoulder 3/10 knee at worst 10/10 currently 6/10 Pain location: right shoulder and left knee Pain description:  shoulder aching. Knee aching Aggravating factors: standing and getting up or using arm in adls Relieving factors: massaging area  PRECAUTIONS: None  WEIGHT BEARING RESTRICTIONS No  FALLS:  Has patient fallen in last 6 months? No and patient states she has had near falls but catches herself with hands on wall etc.    LIVING ENVIRONMENT: Lives with: lives alone Lives in: House/apartment Stairs:  stairs outside to basement and on back deck but hasn't done them in 15 years.  Also has attic in home but again has not done in many years.  Has following equipment at home: Quad cane small base, Environmental consultant - 2 wheeled, Environmental consultant - 4 wheeled, shower chair, and Grab bars  OCCUPATION: retired  PLOF: Independent with household mobility with device and Independent with homemaking with ambulation  PATIENT GOALS   -Primary improve strength of knee, try to decrease pain, more steady on feet.  -Secondary improve right shoulder strength, improve range of right shoulder, and decrease pain.    OBJECTIVE:   DIAGNOSTIC FINDINGS:  Impression:  Severe tricompartmental degenerative joint disease with marked bone on bone laterally  PATIENT SURVEYS:  FOTO 50  COGNITION:  Overall cognitive status: Within functional limits for tasks assessed     SENSATION: WFL  EDEMA:  In left knee; medial and proximal to patella    POSTURE: rounded shoulders, forward head, and increased thoracic kyphosis    LOWER EXTREMITY MMT:  MMT Right eval Left eval  Hip flexion 4 4  Hip extension 3 3  Hip abduction    Hip adduction    Hip internal rotation    Hip external rotation    Knee flexion    Knee extension 3 (pts knee gives in pain with cracking with minimal pressure) 3-  Ankle dorsiflexion 3 with minimal pressure knees start cracking) 3(with minimal pressure knees start cracking)  Ankle plantarflexion    Ankle inversion 3 3  Ankle eversion 3    (Blank rows = not tested)  LOWER EXTREMITY ROM:  AROM  Right eval Left eval  Knee flexion 100 80  Knee extension -5 -20    UPPER EXTREMITY ROM:  Active ROM Right eval Left eval  Shoulder flexion 50 AROM 120 degrees and then start of pain   Shoulder extension    Shoulder abduction 60 degrees AROM  130 PROM    (Blank rows = not tested)  UPPER EXTREMITY MMT:  MMT Right eval Left eval  Shoulder flexion 3-   Shoulder extension    Shoulder abduction 3-   Elbow extension 3+   Wrist flexion 3+    (Blank rows = not tested)   FUNCTIONAL TESTS:  5 times sit to stand: 80.46 seconds with b/l UE support Timed up and go (TUG): to Left 60.79 seconds with NBQC  GAIT: Distance walked: 100' Assistive device utilized: Wellsite geologist  small base Level of assistance: SBA and CGA Comments: increwased trunk flexion, decreased cadence, short step length, poor placement of cane on ground, decreased weightbearing on left LE  Sit to stand  B/L ue use at Close supervision, pt with large BOS and must come up in increments, does not get knees flexed enough   TODAY'S TREATMENT: 02/27/22 Pulley 2' flexion and 2' abduction  Scapular retraction 2 x 10 RTB Cervical retraction 2 x 10 IR/ER 2 x 10 RTB bilat (uses 2 bands/2 handles)  Nustep seat 10, arms 9 x 7 min for mobility and endurance  Standing: // bars Lumbar extension x 10 Heel raises x 10 Marching x 10 Tandem stance x 10 sec each; CGA for safety      02/21/22 Seated: Pulley 2' each flexion and abduction Scapular retraction 2 x 10 Cervical retraction 2 x 10 IR/ER 2 x 10 RTB bilat  Sit to stand x 5 1 min and 3 sec (63 sec)  Nustep seat 10, arms 9 x 7 min for mobility and endurance  Standing: Marching x 10 Heel raises x 10      02/19/22 Nustep level 1 x5 mins with b/l UE/LE kinesiotape to Left knee for stability Step up anterior x10 reps b/l onto 4 inch step Lateral step up 4 inch step x10 reps Side steps in ll bars with yellow theraband resistance mid calf in //s 6  laps Walking march b/l UE support x6laps Pull to stand at // bars with 2 pillows and foam on top of chair 5 reps x2 sets, rest between  02/14/22 Pulley 2' each flexion and abduction Scapular retraction RTB 2 x 10 IR/ER 2 x 10 RTB  Trial of kinesiotape to Left knee for stability  Standing:  Heel toe raises x 10 Hip ext x 5 each March x 5 each Hip abduction x 5 each  02/12/22 UE pulleys into flexion and abd x2 mins Step up anterior x5 reps b/l onto 4 inch step Lateral step up 4 inch step x6 reps Attempted mini knee bends x5 Heel raises x15 Nustep level 1 x6 mins, half rom to begin into full rom Mini bridge x10 Supine marches x10 Mini crunch with UE tap to opposite knee x10 SLR x10    02/08/22 Seated:  LAQ 10X5" each LE AROM  Hip flexion 10X alternating bilaterally AROM  Sit to stand with less UE assist 5X Supine: Heelslides 10X each LE  Bridging with knees bent in available ROM  PVC pipe Rt UE flexion AAROM with Lt assist 5X  PVC pipe Rt UE Abduction AAROM with Lt assit 5X  Hip abduction 10X each  01/17/22 HEP established, goals reviewed Seated:  LAQ 10X5" each LE  Hip flexion 10X alternating bilaterally Supine: Heelslides 10X each LE  Bridging with knees bent in available ROM  PVC pipe Rt UE flexion AAROM with Lt assist 5X  PVC pipe Rt UE Abduction AAROM with Lt assit 5X  PATIENT EDUCATION:  Education details: 6/14: Goals reviewed, HEP initiated; evaluation: Patient educated on exam findings, POC, scope of PT. Person educated: Patient Education method: Explanation, Demonstration, and Handouts Education comprehension: verbalized understanding, returned demonstration, verbal cues required, and tactile cues required    HOME EXERCISE PROGRAM: Access Code: [email or web address] URL: [email or web address] Date: 02/14/2022 Prepared by: [facility]  Exercises - Standing Hip Extension with Counter Support  - 2 x daily - 7 x weekly - 2 sets - 5 reps - Standing Hip  Abduction with Counter Support  -  2 x daily - 7 x weekly - 2 sets - 5 reps - Standing March with Counter Support  - 2 x daily - 7 x weekly - 2 sets - 5 reps  Access Code: [email or web address] URL: [email or web address] Date: 01/17/2022 Prepared by: Roseanne Reno  Exercises - Seated Long Arc Quad  - 2 x daily - 7 x weekly - 2 sets - 10 reps - 5 seconds hold - Seated March  - 2 x daily - 7 x weekly - 2 sets - 10 reps - Supine Heel Slide  - 2 x daily - 7 x weekly - 2 sets - 10 reps - Bridge  - 2 x daily - 7 x weekly - 2 sets - 10 reps - Supine Shoulder Flexion with Dowel  - 2 x daily - 7 x weekly - 2 sets - 10 reps - Supine Shoulder Abduction AAROM with Dowel  - 2 x daily - 7 x weekly - 2 sets - 10 reps  ASSESSMENT:  CLINICAL IMPRESSION: Today's session focused on upper and lower extremity strengthening; mobility and balance.  Added tandem stance for balance; lumbar extensions as patient stands and walks with forward flexed trunk.  Increased intensity of band exercises without issue. Patient continues with ambulation with forward flexed trunk; impaired balance.  Patient will benefit from continued skilled therapy interventions to address deficits and improve functional mobility.   OBJECTIVE IMPAIRMENTS Abnormal gait, decreased activity tolerance, decreased balance, decreased endurance, decreased knowledge of use of DME, decreased mobility, difficulty walking, decreased ROM, decreased strength, increased edema, increased fascial restrictions, impaired flexibility, impaired UE functional use, postural dysfunction, and pain.   ACTIVITY LIMITATIONS carrying, lifting, bending, standing, squatting, stairs, transfers, bed mobility, bathing, toileting, dressing, reach over head, and locomotion level  PARTICIPATION LIMITATIONS: cleaning, laundry, shopping, community activity, and yard work  PERSONAL FACTORS Age and 3+ comorbidities:    Allergies, Arthritis, Back pain, BMI over 30, Cancer, Hearing  Impairment, High Blood Pressure, Osteoporosis, Prior Surgeryare also affecting patient's functional outcome.   REHAB POTENTIAL: Fair chronicity of impairment, multi areas affected  CLINICAL DECISION MAKING: Stable/uncomplicated  EVALUATION COMPLEXITY: High   GOALS: Goals reviewed with patient? Yes  SHORT TERM GOALS: Target date: 02/09/2022  Patient will be independent with HEP in order to improve functional outcomes. Baseline:  Goal status: IN PROGRESS  2.  Patient will report at least 25% improvement in symptoms for improved quality of life. Baseline:  Goal status: IN PROGRESS  3.  Patient will improve five time sit to stand by 20 seconds to work towards improved efficiency in transfer and demonstrate improving LE strength. Baseline: 5 times sit to stand: 80.46 seconds with b/l UE support; 02/21/22 63 sec Goal status: IN PROGRESS  4.  Pt will improve TUG by 10 seconds to demonstrate improving efficiency in ambulation Baseline:  Timed up and go (TUG): to Left 60.79 seconds with NBQC Goal status: IN PROGRESS  5.  Pt will improve right shoulder arom to 70 degrees to work towards improved functional use in adls Baseline: 50 degrees Goal status: IN PROGRESS    LONG TERM GOALS: Target date: 03/09/2022  1.  Patient will improve FOTO score by at least 10 points in order to indicate improved tolerance to activity. Baseline: 50 Goal status: IN PROGRESS  2. Patient will improve five time sit to stand by 40 seconds to work towards improved efficiency in transfer and demonstrate improving LE strength. Baseline: 5 times sit to stand: 80.46 seconds with b/l UE support Goal  status: IN PROGRESS  4.  Pt will improve TUG by 20 seconds to demonstrate improving efficiency in ambulation Baseline:  Timed up and go (TUG): to Left 60.79 seconds with NBQC Goal status: IN PROGRESS  5.  Pt will improve right shoulder arom to 90 degrees to work towards improved functional use in adls Baseline:  50 degrees Goal status: IN PROGRESS    PLAN: PT FREQUENCY: 2x/week  PT DURATION: 8 weeks  PLANNED INTERVENTIONS: PLANNED INTERVENTIONS: Therapeutic exercises, Therapeutic activity, Neuromuscular re-education, Balance training, Gait training, Patient/Family education, Joint manipulation, Joint mobilization, Stair training, Orthotic/Fit training, DME instructions, Aquatic Therapy, Dry Needling, Electrical stimulation, Spinal manipulation, Spinal mobilization, Cryotherapy, Moist heat, Compression bandaging, scar mobilization, Splintting, Taping, Traction, Ultrasound, Ionotophoresis '4mg'$ /ml Dexamethasone, and Manual therapy   PLAN FOR NEXT SESSION: continue to build right shoulder strength and ROM as well as B/L LE strength and stretching.     1:05 PM, 02/27/22 Jadon Ressler [place] Shrestha MPT [facility] [place] [phone]

## 2022-03-01 ENCOUNTER — Ambulatory Visit (HOSPITAL_COMMUNITY): Payer: Medicare HMO

## 2022-03-01 DIAGNOSIS — G8929 Other chronic pain: Secondary | ICD-10-CM

## 2022-03-01 DIAGNOSIS — M25562 Pain in left knee: Secondary | ICD-10-CM | POA: Diagnosis not present

## 2022-03-01 DIAGNOSIS — M25511 Pain in right shoulder: Secondary | ICD-10-CM | POA: Diagnosis not present

## 2022-03-01 DIAGNOSIS — R2689 Other abnormalities of gait and mobility: Secondary | ICD-10-CM | POA: Diagnosis not present

## 2022-03-01 DIAGNOSIS — M6281 Muscle weakness (generalized): Secondary | ICD-10-CM

## 2022-03-01 NOTE — Therapy (Addendum)
OUTPATIENT PHYSICAL THERAPY PROGRESS NOTE  Patient Name: Lisa Klein MRN: [id number] DOB:08/01/1930, 88 y.o., female Today's Date: 03/01/2022   PT End of Session - 03/01/22 1126     Visit Number 9    Number of Visits 16    Date for PT Re-Evaluation 03/09/22    Authorization Type [employer] H[employer] (auth required, no visit limit based on med necessity) (please check)    Authorization Time Period 12 visits approved 6/14-7/27    Authorization - Visit Number 9    Authorization - Number of Visits 12    Progress Note Due on Visit 12    PT Start Time 1120    PT Stop Time 2951    PT Time Calculation (min) 41 min                  Past Medical History:  Diagnosis Date   Cancer ([place]) 2012   LEFT BREAST   Clotting disorder ([place]) 05/2011   dvt, right leg   Degenerative disc disease    with nerve compression    DJD (degenerative joint disease)    Hypertension    Hypertension    Hypothyroidism    Hypothyroidism    Long term current use of anticoagulant therapy 12/07/2014   Pulmonary embolus    Obesity    Osteopenia 08/24/2014   Past Surgical History:  Procedure Laterality Date   ABDOMINAL HYSTERECTOMY  1980   fibroids   BREAST SURGERY  2012   left total mastectomy   CHOLECYSTECTOMY  80'S   [facility]   CHOLECYSTECTOMY     HIP PINNING,CANNULATED Left 01/19/2013   Procedure: CANNULATED HIP PINNING-  left;  Surgeon: Marin Shutter, MD;  Location: [facility];  Service: Orthopedics;  Laterality: Left;   MASTECTOMY MODIFIED RADICAL  02/14/11   left   PORT-A-CATH REMOVAL N/A 12/05/2012   Procedure: MINOR REMOVAL PORT-A-CATH;  Surgeon: Jamesetta So, MD;  Location: [facility];  Service: General;  Laterality: N/A;  In Minor Room   PORTACATH PLACEMENT  04/16/2011   Procedure: INSERTION PORT-A-CATH;  Surgeon: Jamesetta So;  Location: [facility];  Service: General;  Laterality: Right;  right subclavian   VESICOVAGINAL FISTULA CLOSURE W/ TAH     [facility]   Patient Active Problem List   Diagnosis  Date Noted   Bilateral shoulder pain 12/19/2021   Fatigue 03/27/2021   Shortness of breath 03/27/2021   GERD (gastroesophageal reflux disease) 03/27/2021   Knee pain, left 02/15/2020   Generalized osteoarthritis of multiple sites 04/02/2018   Allergic rhinitis 09/01/2015   Obesity 12/13/2014   Long term current use of anticoagulant therapy 12/07/2014   Osteopenia 08/24/2014   Infiltrating ductal carcinoma of breast ([place]) 04/10/2011   Hyperlipemia 11/07/2007   Hypothyroidism 08/15/2007   Essential hypertension 08/15/2007    PCP: [name] MD  REFERRING PROVIDER: [name] MD  REFERRING DIAG: M25.511,G89.29,M25.512 (ICD-10-CM) - Chronic pain of both shoulders M25.562,G89.29 (ICD-10-CM) - Chronic pain of left knee   THERAPY DIAG:  Chronic pain of left knee  Other abnormalities of gait and mobility  Chronic right shoulder pain  Muscle weakness (generalized)  Rationale for Evaluation and Treatment Rehabilitation  ONSET DATE: approximately 2013  SUBJECTIVE:   SUBJECTIVE STATEMENT: Overall my right shoulder is better; my knees are a little better too although still sore.  "About 25% better"  PERTINENT HISTORY:  Right shoulder soreness and limited AROM, left arm soreness but more ROM  Left knee pain with genu valgum  Severe  arthritis There is severe tricompartmental degenerative joint disease of the left knee with marked bone on bone and osteophytes more laterally and knock-knee deformity present.  No fracture or loose body noted.  Bone quality is good.  Breast cancer in 2012  Knee pain, left Increased pain , stiffness and instability    Bilateral shoulder pain Pain , stiffness and reduced shoulder mobility right greater than left  History of left hip fx with ORIF       PAIN:  Are you having pain? Yes: NPRS scale: left shoulder 3/10 knee at worst 10/10 currently 6/10 Pain location: right shoulder and left knee Pain description: shoulder aching.  Knee aching Aggravating factors: standing and getting up or using arm in adls Relieving factors: massaging area  PRECAUTIONS: None  WEIGHT BEARING RESTRICTIONS No  FALLS:  Has patient fallen in last 6 months? No and patient states she has had near falls but catches herself with hands on wall etc.    LIVING ENVIRONMENT: Lives with: lives alone Lives in: House/apartment Stairs:  stairs outside to basement and on back deck but hasn't done them in 15 years.  Also has attic in home but again has not done in many years.  Has following equipment at home: Quad cane small base, Environmental consultant - 2 wheeled, Environmental consultant - 4 wheeled, shower chair, and Grab bars  OCCUPATION: retired  PLOF: Independent with household mobility with device and Independent with homemaking with ambulation  PATIENT GOALS   -Primary improve strength of knee, try to decrease pain, more steady on feet.  -Secondary improve right shoulder strength, improve range of right shoulder, and decrease pain.    OBJECTIVE:   DIAGNOSTIC FINDINGS:  Impression:  Severe tricompartmental degenerative joint disease with marked bone on bone laterally  PATIENT SURVEYS:  FOTO 50  COGNITION:  Overall cognitive status: Within functional limits for tasks assessed     SENSATION: WFL  EDEMA:  In left knee; medial and proximal to patella    POSTURE: rounded shoulders, forward head, and increased thoracic kyphosis    LOWER EXTREMITY MMT:  MMT Right eval Left eval  Hip flexion 4 4  Hip extension 3 3  Hip abduction    Hip adduction    Hip internal rotation    Hip external rotation    Knee flexion    Knee extension 3 (pts knee gives in pain with cracking with minimal pressure) 3-  Ankle dorsiflexion 3 with minimal pressure knees start cracking) 3(with minimal pressure knees start cracking)  Ankle plantarflexion    Ankle inversion 3 3  Ankle eversion 3    (Blank rows = not tested)  LOWER EXTREMITY ROM:  AROM Right eval Left eval   Knee flexion 100 80  Knee extension -5 -20    UPPER EXTREMITY ROM:  Active ROM Right eval Left eval Right 03/01/22  Shoulder flexion 50 AROM 120 degrees and then start of pain  127 AROM in sitting  Shoulder extension     Shoulder abduction 60 degrees AROM  130 PROM     (Blank rows = not tested)  UPPER EXTREMITY MMT:  MMT Right eval Left eval Right 03/01/22  Shoulder flexion 3-  4  Shoulder extension     Shoulder abduction 3-    Elbow extension/ flexion 3+  4+/4+  Wrist flexion 3+  4+   (Blank rows = not tested)   FUNCTIONAL TESTS:  5 times sit to stand: 80.46 seconds with b/l UE support Timed up and go (TUG): to  Left 60.79 seconds with NBQC  GAIT: Distance walked: 100' Assistive device utilized: Lobbyist Level of assistance: SBA and CGA Comments: increwased trunk flexion, decreased cadence, short step length, poor placement of cane on ground, decreased weightbearing on left LE  Sit to stand  B/L ue use at Close supervision, pt with large BOS and must come up in increments, does not get knees flexed enough   TODAY'S TREATMENT: 03/01/22 FOTO 54 Progress note Pulley 2' flexion and 2' abduction TUG 51 sec today with QC 5 x sit to stand 55 sec today (goal met) Left knee kinesiotape for support  02/27/22 Pulley 2' flexion and 2' abduction  Scapular retraction 2 x 10 RTB Cervical retraction 2 x 10 IR/ER 2 x 10 RTB bilat (uses 2 bands/2 handles)  Nustep seat 10, arms 9 x 7 min for mobility and endurance  Standing: // bars Lumbar extension x 10 Heel raises x 10 Marching x 10 Tandem stance x 10 sec each; CGA for safety      02/21/22 Seated: Pulley 2' each flexion and abduction Scapular retraction 2 x 10 Cervical retraction 2 x 10 IR/ER 2 x 10 RTB bilat  Sit to stand x 5 1 min and 3 sec (63 sec)  Nustep seat 10, arms 9 x 7 min for mobility and endurance  Standing: Marching x 10 Heel raises x 10      02/19/22 Nustep level 1 x5  mins with b/l UE/LE kinesiotape to Left knee for stability Step up anterior x10 reps b/l onto 4 inch step Lateral step up 4 inch step x10 reps Side steps in ll bars with yellow theraband resistance mid calf in //s 6 laps Walking march b/l UE support x6laps Pull to stand at // bars with 2 pillows and foam on top of chair 5 reps x2 sets, rest between  02/14/22 Pulley 2' each flexion and abduction Scapular retraction RTB 2 x 10 IR/ER 2 x 10 RTB  Trial of kinesiotape to Left knee for stability  Standing:  Heel toe raises x 10 Hip ext x 5 each March x 5 each Hip abduction x 5 each  02/12/22 UE pulleys into flexion and abd x2 mins Step up anterior x5 reps b/l onto 4 inch step Lateral step up 4 inch step x6 reps Attempted mini knee bends x5 Heel raises x15 Nustep level 1 x6 mins, half rom to begin into full rom Mini bridge x10 Supine marches x10 Mini crunch with UE tap to opposite knee x10 SLR x10    02/08/22 Seated:  LAQ 10X5" each LE AROM  Hip flexion 10X alternating bilaterally AROM  Sit to stand with less UE assist 5X Supine: Heelslides 10X each LE  Bridging with knees bent in available ROM  PVC pipe Rt UE flexion AAROM with Lt assist 5X  PVC pipe Rt UE Abduction AAROM with Lt assit 5X  Hip abduction 10X each  01/17/22 HEP established, goals reviewed Seated:  LAQ 10X5" each LE  Hip flexion 10X alternating bilaterally Supine: Heelslides 10X each LE  Bridging with knees bent in available ROM  PVC pipe Rt UE flexion AAROM with Lt assist 5X  PVC pipe Rt UE Abduction AAROM with Lt assit 5X  PATIENT EDUCATION:  Education details: 6/14: Goals reviewed, HEP initiated; evaluation: Patient educated on exam findings, POC, scope of PT. Person educated: Patient Education method: Explanation, Demonstration, and Handouts Education comprehension: verbalized understanding, returned demonstration, verbal cues required, and tactile cues required    HOME  EXERCISE PROGRAM: Access  Code: [email or web address] URL: [email or web address] Date: 02/14/2022 Prepared by: [facility]  Exercises - Standing Hip Extension with Counter Support  - 2 x daily - 7 x weekly - 2 sets - 5 reps - Standing Hip Abduction with Counter Support  - 2 x daily - 7 x weekly - 2 sets - 5 reps - Standing March with Counter Support  - 2 x daily - 7 x weekly - 2 sets - 5 reps  Access Code: [email or web address] URL: [email or web address] Date: 01/17/2022 Prepared by: Roseanne Reno  Exercises - Seated Long Arc Quad  - 2 x daily - 7 x weekly - 2 sets - 10 reps - 5 seconds hold - Seated March  - 2 x daily - 7 x weekly - 2 sets - 10 reps - Supine Heel Slide  - 2 x daily - 7 x weekly - 2 sets - 10 reps - Bridge  - 2 x daily - 7 x weekly - 2 sets - 10 reps - Supine Shoulder Flexion with Dowel  - 2 x daily - 7 x weekly - 2 sets - 10 reps - Supine Shoulder Abduction AAROM with Dowel  - 2 x daily - 7 x weekly - 2 sets - 10 reps  ASSESSMENT:  CLINICAL IMPRESSION: Progress note today; last day of insurance auth; patient with good progress right shoulder mobility and strength; improved score TUG and 5 times sit to stand test.  Patient continues with knee pain left more than right but overall progressing.   Patient will benefit from continued skilled therapy interventions to address deficits and improve functional mobility.   OBJECTIVE IMPAIRMENTS Abnormal gait, decreased activity tolerance, decreased balance, decreased endurance, decreased knowledge of use of DME, decreased mobility, difficulty walking, decreased ROM, decreased strength, increased edema, increased fascial restrictions, impaired flexibility, impaired UE functional use, postural dysfunction, and pain.   ACTIVITY LIMITATIONS carrying, lifting, bending, standing, squatting, stairs, transfers, bed mobility, bathing, toileting, dressing, reach over head, and locomotion level  PARTICIPATION LIMITATIONS: cleaning, laundry, shopping, community  activity, and yard work  PERSONAL FACTORS Age and 3+ comorbidities:    Allergies, Arthritis, Back pain, BMI over 30, Cancer, Hearing Impairment, High Blood Pressure, Osteoporosis, Prior Surgeryare also affecting patient's functional outcome.   REHAB POTENTIAL: Fair chronicity of impairment, multi areas affected  CLINICAL DECISION MAKING: Stable/uncomplicated  EVALUATION COMPLEXITY: High   GOALS: Goals reviewed with patient? Yes  SHORT TERM GOALS: Target date: 02/09/2022  Patient will be independent with HEP in order to improve functional outcomes. Baseline:  Goal status: met  2.  Patient will report at least 25% improvement in symptoms for improved quality of life. Baseline:  Goal status: met  3.  Patient will improve five time sit to stand by 20 seconds to work towards improved efficiency in transfer and demonstrate improving LE strength. Baseline: 5 times sit to stand: 80.46 seconds with b/l UE support; 02/21/22 63 sec; 03/01/22 55 sec Goal status:goal met  4.  Pt will improve TUG by 10 seconds to demonstrate improving efficiency in ambulation Baseline: 03/01/22 51 sec Timed up and go (TUG): to Left 60.79 seconds with NBQC Goal status: IN PROGRESS  5.  Pt will improve right shoulder arom to 70 degrees to work towards improved functional use in adls Baseline: 50 degrees Goal status:met    LONG TERM GOALS: Target date: 03/09/2022  1.  Patient will improve FOTO score by at least 10 points in order to indicate improved tolerance to activity. Baseline: 50  Goal status: IN PROGRESS  2. Patient will improve five time sit to stand by 40 seconds to work towards improved efficiency in transfer and demonstrate improving LE strength. Baseline: 5 times sit to stand: 80.46 seconds with b/l UE support Goal status: IN PROGRESS  4.  Pt will improve TUG by 20 seconds to demonstrate improving efficiency in ambulation Baseline:  Timed up and go (TUG): to Left 60.79 seconds with NBQC Goal  status: IN PROGRESS  5.  Pt will improve right shoulder arom to 90 degrees to work towards improved functional use in adls Baseline: 50 degrees Goal status: IN PROGRESS    PLAN: PT FREQUENCY: 2x/week  PT DURATION: 8 weeks  PLANNED INTERVENTIONS: PLANNED INTERVENTIONS: Therapeutic exercises, Therapeutic activity, Neuromuscular re-education, Balance training, Gait training, Patient/Family education, Joint manipulation, Joint mobilization, Stair training, Orthotic/Fit training, DME instructions, Aquatic Therapy, Dry Needling, Electrical stimulation, Spinal manipulation, Spinal mobilization, Cryotherapy, Moist heat, Compression bandaging, scar mobilization, Splintting, Taping, Traction, Ultrasound, Ionotophoresis 66m/ml Dexamethasone, and Manual therapy   PLAN FOR NEXT SESSION: continue to build right shoulder strength and ROM as well as B/L LE strength and stretching.  Recommend continued therapy to address unmet and partially met goals.     12:06 PM, 03/01/22 Peniel Biel Small Madlyn Crosby MPT [facility] [place] #[phone]

## 2022-03-05 ENCOUNTER — Ambulatory Visit (HOSPITAL_COMMUNITY): Payer: Medicare HMO | Admitting: Physical Therapy

## 2022-03-05 DIAGNOSIS — M6281 Muscle weakness (generalized): Secondary | ICD-10-CM

## 2022-03-05 DIAGNOSIS — G8929 Other chronic pain: Secondary | ICD-10-CM

## 2022-03-05 DIAGNOSIS — R2689 Other abnormalities of gait and mobility: Secondary | ICD-10-CM | POA: Diagnosis not present

## 2022-03-05 DIAGNOSIS — M25511 Pain in right shoulder: Secondary | ICD-10-CM | POA: Diagnosis not present

## 2022-03-05 DIAGNOSIS — M25562 Pain in left knee: Secondary | ICD-10-CM | POA: Diagnosis not present

## 2022-03-05 NOTE — Therapy (Signed)
OUTPATIENT PHYSICAL THERAPY TREATMENT  Patient Name: Lisa Klein MRN: [id number] DOB:03/12/1930, 88 y.o., female Today's Date: 03/05/2022   PT End of Session - 03/05/22 1326     Visit Number 10    Number of Visits 18    Date for PT Re-Evaluation 04/02/22    Authorization Type [employer] H[employer] (auth required, no visit limit based on med necessity)    Authorization Time Period 8 visits approved 7/31-8/28    Authorization - Visit Number 1    Authorization - Number of Visits 8    Progress Note Due on Visit 51    PT Start Time 1120    PT Stop Time 1200    PT Time Calculation (min) 40 min                Past Medical History:  Diagnosis Date   Cancer ([place]) 2012   LEFT BREAST   Clotting disorder ([place]) 05/2011   dvt, right leg   Degenerative disc disease    with nerve compression    DJD (degenerative joint disease)    Hypertension    Hypertension    Hypothyroidism    Hypothyroidism    Long term current use of anticoagulant therapy 12/07/2014   Pulmonary embolus    Obesity    Osteopenia 08/24/2014   Past Surgical History:  Procedure Laterality Date   ABDOMINAL HYSTERECTOMY  1980   fibroids   BREAST SURGERY  2012   left total mastectomy   CHOLECYSTECTOMY  80'S   [facility]   CHOLECYSTECTOMY     HIP PINNING,CANNULATED Left 01/19/2013   Procedure: CANNULATED HIP PINNING-  left;  Surgeon: Marin Shutter, MD;  Location: [place];  Service: Orthopedics;  Laterality: Left;   MASTECTOMY MODIFIED RADICAL  02/14/11   left   PORT-A-CATH REMOVAL N/A 12/05/2012   Procedure: MINOR REMOVAL PORT-A-CATH;  Surgeon: Jamesetta So, MD;  Location: [facility];  Service: General;  Laterality: N/A;  In Minor Room   PORTACATH PLACEMENT  04/16/2011   Procedure: INSERTION PORT-A-CATH;  Surgeon: Jamesetta So;  Location: [facility];  Service: General;  Laterality: Right;  right subclavian   VESICOVAGINAL FISTULA CLOSURE W/ TAH     [facility]   Patient Active Problem List   Diagnosis Date Noted   Bilateral  shoulder pain 12/19/2021   Fatigue 03/27/2021   Shortness of breath 03/27/2021   GERD (gastroesophageal reflux disease) 03/27/2021   Knee pain, left 02/15/2020   Generalized osteoarthritis of multiple sites 04/02/2018   Allergic rhinitis 09/01/2015   Obesity 12/13/2014   Long term current use of anticoagulant therapy 12/07/2014   Osteopenia 08/24/2014   Infiltrating ductal carcinoma of breast ([place]) 04/10/2011   Hyperlipemia 11/07/2007   Hypothyroidism 08/15/2007   Essential hypertension 08/15/2007    PCP: [name] MD  REFERRING PROVIDER: [name] MD  REFERRING DIAG: M25.511,G89.29,M25.512 (ICD-10-CM) - Chronic pain of both shoulders M25.562,G89.29 (ICD-10-CM) - Chronic pain of left knee   THERAPY DIAG:  Chronic pain of left knee  Other abnormalities of gait and mobility  Chronic right shoulder pain  Muscle weakness (generalized)  Rationale for Evaluation and Treatment Rehabilitation  ONSET DATE: approximately 2013  SUBJECTIVE:   SUBJECTIVE STATEMENT: Soreness and stiffness in hips, knees and shoulders today.  Reports some ear issues and needs to go to Dr.  Verline Lema HISTORY:  Right shoulder soreness and limited AROM, left arm soreness but more ROM  Left knee pain with genu valgum  Severe arthritis There is severe tricompartmental  degenerative joint disease of the left knee with marked bone on bone and osteophytes more laterally and knock-knee deformity present.  No fracture or loose body noted.  Bone quality is good.  Breast cancer in 2012  Knee pain, left Increased pain , stiffness and instability    Bilateral shoulder pain Pain , stiffness and reduced shoulder mobility right greater than left  History of left hip fx with ORIF       PAIN:  Are you having pain? Yes: NPRS scale: left shoulder 3/10 knee at worst 10/10 currently 6/10 Pain location: right shoulder and left knee Pain description: shoulder aching. Knee aching Aggravating  factors: standing and getting up or using arm in adls Relieving factors: massaging area  PRECAUTIONS: None  WEIGHT BEARING RESTRICTIONS No  FALLS:  Has patient fallen in last 6 months? No and patient states she has had near falls but catches herself with hands on wall etc.    LIVING ENVIRONMENT: Lives with: lives alone Lives in: House/apartment Stairs:  stairs outside to basement and on back deck but hasn't done them in 15 years.  Also has attic in home but again has not done in many years.  Has following equipment at home: Quad cane small base, Environmental consultant - 2 wheeled, Environmental consultant - 4 wheeled, shower chair, and Grab bars  OCCUPATION: retired  PLOF: Independent with household mobility with device and Independent with homemaking with ambulation  PATIENT GOALS   -Primary improve strength of knee, try to decrease pain, more steady on feet.  -Secondary improve right shoulder strength, improve range of right shoulder, and decrease pain.    OBJECTIVE:   DIAGNOSTIC FINDINGS:  Impression:  Severe tricompartmental degenerative joint disease with marked bone on bone laterally  PATIENT SURVEYS:  FOTO 50  COGNITION:  Overall cognitive status: Within functional limits for tasks assessed     SENSATION: WFL  EDEMA:  In left knee; medial and proximal to patella    POSTURE: rounded shoulders, forward head, and increased thoracic kyphosis    LOWER EXTREMITY MMT:  MMT Right eval Left eval  Hip flexion 4 4  Hip extension 3 3  Hip abduction    Hip adduction    Hip internal rotation    Hip external rotation    Knee flexion    Knee extension 3 (pts knee gives in pain with cracking with minimal pressure) 3-  Ankle dorsiflexion 3 with minimal pressure knees start cracking) 3(with minimal pressure knees start cracking)  Ankle plantarflexion    Ankle inversion 3 3  Ankle eversion 3    (Blank rows = not tested)  LOWER EXTREMITY ROM:  AROM Right eval Left eval  Knee flexion 100 80   Knee extension -5 -20    UPPER EXTREMITY ROM:  Active ROM Right eval Left eval Right 03/01/22  Shoulder flexion 50 AROM 120 degrees and then start of pain  127 AROM in sitting  Shoulder extension     Shoulder abduction 60 degrees AROM  130 PROM     (Blank rows = not tested)  UPPER EXTREMITY MMT:  MMT Right eval Left eval Right 03/01/22  Shoulder flexion 3-  4  Shoulder extension     Shoulder abduction 3-    Elbow extension/ flexion 3+  4+/4+  Wrist flexion 3+  4+   (Blank rows = not tested)   FUNCTIONAL TESTS:  5 times sit to stand: 80.46 seconds with b/l UE support Timed up and go (TUG): to Left 60.79 seconds with NBQC  GAIT: Distance walked: 100' Assistive device utilized: Lobbyist Level of assistance: SBA and CGA Comments: increwased trunk flexion, decreased cadence, short step length, poor placement of cane on ground, decreased weightbearing on left LE  Sit to stand  B/L ue use at Close supervision, pt with large BOS and must come up in increments, does not get knees flexed enough   TODAY'S TREATMENT: 03/05/2022 Nustep seat 10, arms 8 x 5 min for mobility and endurance above 55 SPM Seated:  Pulleys 2' flexion, 2' abduction  Scapular retraction 2X10 RTB  IR/ER 2X10 RTB  Shoulder extensions 2X10 RTB Standing: // bars Heel raises x 20 Marching x 20 alternating Tandem stance x 10 sec each; CGA for safety  Hip Abduction 10X each in upright posturing  Hip Extension 10X each in upright posturing   kinesiotape to Left knee for stability   03/01/22 FOTO 54 Progress note Pulley 2' flexion and 2' abduction TUG 51 sec today with QC 5 x sit to stand 55 sec today (goal met) Left knee kinesiotape for support  02/27/22 Pulley 2' flexion and 2' abduction  Scapular retraction 2 x 10 RTB Cervical retraction 2 x 10 IR/ER 2 x 10 RTB bilat (uses 2 bands/2 handles)  Nustep seat 10, arms 9 x 7 min for mobility and endurance  Standing: //  bars Lumbar extension x 10 Heel raises x 10 Marching x 10 Tandem stance x 10 sec each; CGA for safety   02/21/22 Seated: Pulley 2' each flexion and abduction Scapular retraction 2 x 10 Cervical retraction 2 x 10 IR/ER 2 x 10 RTB bilat  Sit to stand x 5 1 min and 3 sec (63 sec)  Nustep seat 10, arms 9 x 7 min for mobility and endurance  Standing: Marching x 10 Heel raises x 10   02/19/22 Nustep level 1 x5 mins with b/l UE/LE kinesiotape to Left knee for stability Step up anterior x10 reps b/l onto 4 inch step Lateral step up 4 inch step x10 reps Side steps in ll bars with yellow theraband resistance mid calf in //s 6 laps Walking march b/l UE support x6laps Pull to stand at // bars with 2 pillows and foam on top of chair 5 reps x2 sets, rest between  02/14/22 Pulley 2' each flexion and abduction Scapular retraction RTB 2 x 10 IR/ER 2 x 10 RTB  Trial of kinesiotape to Left knee for stability  Standing:  Heel toe raises x 10 Hip ext x 5 each March x 5 each Hip abduction x 5 each  02/12/22 UE pulleys into flexion and abd x2 mins Step up anterior x5 reps b/l onto 4 inch step Lateral step up 4 inch step x6 reps Attempted mini knee bends x5 Heel raises x15 Nustep level 1 x6 mins, half rom to begin into full rom Mini bridge x10 Supine marches x10 Mini crunch with UE tap to opposite knee x10 SLR x10    02/08/22 Seated:  LAQ 10X5" each LE AROM  Hip flexion 10X alternating bilaterally AROM  Sit to stand with less UE assist 5X Supine: Heelslides 10X each LE  Bridging with knees bent in available ROM  PVC pipe Rt UE flexion AAROM with Lt assist 5X  PVC pipe Rt UE Abduction AAROM with Lt assit 5X  Hip abduction 10X each  01/17/22 HEP established, goals reviewed Seated:  LAQ 10X5" each LE  Hip flexion 10X alternating bilaterally Supine: Heelslides 10X each LE  Bridging with knees bent in  available ROM  PVC pipe Rt UE flexion AAROM with Lt assist 5X  PVC pipe Rt  UE Abduction AAROM with Lt assit 5X  PATIENT EDUCATION:  Education details: 6/14: Goals reviewed, HEP initiated; evaluation: Patient educated on exam findings, POC, scope of PT. Person educated: Patient Education method: Explanation, Demonstration, and Handouts Education comprehension: verbalized understanding, returned demonstration, verbal cues required, and tactile cues required    HOME EXERCISE PROGRAM: Access Code: [email or web address] URL: [email or web address] Date: 02/14/2022 Prepared by: [facility]  Exercises - Standing Hip Extension with Counter Support  - 2 x daily - 7 x weekly - 2 sets - 5 reps - Standing Hip Abduction with Counter Support  - 2 x daily - 7 x weekly - 2 sets - 5 reps - Standing March with Counter Support  - 2 x daily - 7 x weekly - 2 sets - 5 reps  Access Code: [email or web address] URL: [email or web address] Date: 01/17/2022 Prepared by: Roseanne Reno  Exercises - Seated Long Arc Quad  - 2 x daily - 7 x weekly - 2 sets - 10 reps - 5 seconds hold - Seated March  - 2 x daily - 7 x weekly - 2 sets - 10 reps - Supine Heel Slide  - 2 x daily - 7 x weekly - 2 sets - 10 reps - Bridge  - 2 x daily - 7 x weekly - 2 sets - 10 reps - Supine Shoulder Flexion with Dowel  - 2 x daily - 7 x weekly - 2 sets - 10 reps - Supine Shoulder Abduction AAROM with Dowel  - 2 x daily - 7 x weekly - 2 sets - 10 reps  ASSESSMENT:  CLINICAL IMPRESSION: Patient continues to slowly progress with function and mobility.  Postural cues needed to remain upright as tends to slouch with seated exercises and forward flex in standing. Pt able to complete therex today with minimal seated rest breaks.  Retaped knee this session per pt request as she believes this is helping.  Patient will benefit from continued skilled therapy interventions to address deficits and improve functional mobility.   OBJECTIVE IMPAIRMENTS Abnormal gait, decreased activity tolerance, decreased balance, decreased  endurance, decreased knowledge of use of DME, decreased mobility, difficulty walking, decreased ROM, decreased strength, increased edema, increased fascial restrictions, impaired flexibility, impaired UE functional use, postural dysfunction, and pain.   ACTIVITY LIMITATIONS carrying, lifting, bending, standing, squatting, stairs, transfers, bed mobility, bathing, toileting, dressing, reach over head, and locomotion level  PARTICIPATION LIMITATIONS: cleaning, laundry, shopping, community activity, and yard work  PERSONAL FACTORS Age and 3+ comorbidities:    Allergies, Arthritis, Back pain, BMI over 30, Cancer, Hearing Impairment, High Blood Pressure, Osteoporosis, Prior Surgeryare also affecting patient's functional outcome.   REHAB POTENTIAL: Fair chronicity of impairment, multi areas affected  CLINICAL DECISION MAKING: Stable/uncomplicated  EVALUATION COMPLEXITY: High   GOALS: Goals reviewed with patient? Yes  SHORT TERM GOALS: Target date: 02/09/2022  Patient will be independent with HEP in order to improve functional outcomes. Baseline:  Goal status: met  2.  Patient will report at least 25% improvement in symptoms for improved quality of life. Baseline:  Goal status: met  3.  Patient will improve five time sit to stand by 20 seconds to work towards improved efficiency in transfer and demonstrate improving LE strength. Baseline: 5 times sit to stand: 80.46 seconds with b/l UE support; 02/21/22 63 sec; 03/01/22 55 sec Goal status:goal met  4.  Pt will improve TUG by 10  seconds to demonstrate improving efficiency in ambulation Baseline: 03/01/22 51 sec Timed up and go (TUG): to Left 60.79 seconds with NBQC Goal status: IN PROGRESS  5.  Pt will improve right shoulder arom to 70 degrees to work towards improved functional use in adls Baseline: 50 degrees Goal status:met    LONG TERM GOALS: Target date: 03/09/2022  1.  Patient will improve FOTO score by at least 10 points in  order to indicate improved tolerance to activity. Baseline: 50 Goal status: IN PROGRESS  2. Patient will improve five time sit to stand by 40 seconds to work towards improved efficiency in transfer and demonstrate improving LE strength. Baseline: 5 times sit to stand: 80.46 seconds with b/l UE support Goal status: IN PROGRESS  4.  Pt will improve TUG by 20 seconds to demonstrate improving efficiency in ambulation Baseline:  Timed up and go (TUG): to Left 60.79 seconds with NBQC Goal status: IN PROGRESS  5.  Pt will improve right shoulder arom to 90 degrees to work towards improved functional use in adls Baseline: 50 degrees Goal status: IN PROGRESS    PLAN: PT FREQUENCY: 2x/week  PT DURATION: 8 weeks  PLANNED INTERVENTIONS: PLANNED INTERVENTIONS: Therapeutic exercises, Therapeutic activity, Neuromuscular re-education, Balance training, Gait training, Patient/Family education, Joint manipulation, Joint mobilization, Stair training, Orthotic/Fit training, DME instructions, Aquatic Therapy, Dry Needling, Electrical stimulation, Spinal manipulation, Spinal mobilization, Cryotherapy, Moist heat, Compression bandaging, scar mobilization, Splintting, Taping, Traction, Ultrasound, Ionotophoresis 15m/ml Dexamethasone, and Manual therapy   PLAN FOR NEXT SESSION: continue to build right shoulder strength and ROM as well as B/L LE strength and stretching.  Recommend continued therapy to address unmet and partially met goals.  Apply kinesiotape to Lt knee as needed for stability.    1:28 PM, 03/05/22 ATeena Irani PTA/CLT CManassaPh: [phone]

## 2022-03-07 ENCOUNTER — Ambulatory Visit (HOSPITAL_COMMUNITY): Payer: Medicare HMO | Admitting: Physical Therapy

## 2022-03-07 DIAGNOSIS — Z7982 Long term (current) use of aspirin: Secondary | ICD-10-CM | POA: Insufficient documentation

## 2022-03-07 DIAGNOSIS — M6281 Muscle weakness (generalized): Secondary | ICD-10-CM | POA: Insufficient documentation

## 2022-03-07 DIAGNOSIS — Z86711 Personal history of pulmonary embolism: Secondary | ICD-10-CM | POA: Diagnosis not present

## 2022-03-07 DIAGNOSIS — Z170 Estrogen receptor positive status [ER+]: Secondary | ICD-10-CM | POA: Diagnosis not present

## 2022-03-07 DIAGNOSIS — G8929 Other chronic pain: Secondary | ICD-10-CM | POA: Insufficient documentation

## 2022-03-07 DIAGNOSIS — Z79899 Other long term (current) drug therapy: Secondary | ICD-10-CM | POA: Diagnosis not present

## 2022-03-07 DIAGNOSIS — Z8261 Family history of arthritis: Secondary | ICD-10-CM | POA: Insufficient documentation

## 2022-03-07 DIAGNOSIS — C50912 Malignant neoplasm of unspecified site of left female breast: Secondary | ICD-10-CM | POA: Insufficient documentation

## 2022-03-07 DIAGNOSIS — M25511 Pain in right shoulder: Secondary | ICD-10-CM | POA: Insufficient documentation

## 2022-03-07 DIAGNOSIS — M8580 Other specified disorders of bone density and structure, unspecified site: Secondary | ICD-10-CM | POA: Insufficient documentation

## 2022-03-07 DIAGNOSIS — D72819 Decreased white blood cell count, unspecified: Secondary | ICD-10-CM | POA: Insufficient documentation

## 2022-03-07 DIAGNOSIS — Z7901 Long term (current) use of anticoagulants: Secondary | ICD-10-CM | POA: Insufficient documentation

## 2022-03-07 DIAGNOSIS — Z86718 Personal history of other venous thrombosis and embolism: Secondary | ICD-10-CM | POA: Diagnosis not present

## 2022-03-07 DIAGNOSIS — Z923 Personal history of irradiation: Secondary | ICD-10-CM | POA: Diagnosis not present

## 2022-03-07 DIAGNOSIS — E559 Vitamin D deficiency, unspecified: Secondary | ICD-10-CM | POA: Diagnosis not present

## 2022-03-07 DIAGNOSIS — M25562 Pain in left knee: Secondary | ICD-10-CM | POA: Insufficient documentation

## 2022-03-07 DIAGNOSIS — R2689 Other abnormalities of gait and mobility: Secondary | ICD-10-CM | POA: Insufficient documentation

## 2022-03-07 DIAGNOSIS — Z9012 Acquired absence of left breast and nipple: Secondary | ICD-10-CM | POA: Diagnosis not present

## 2022-03-07 DIAGNOSIS — Z8249 Family history of ischemic heart disease and other diseases of the circulatory system: Secondary | ICD-10-CM | POA: Diagnosis not present

## 2022-03-07 NOTE — Therapy (Signed)
OUTPATIENT PHYSICAL THERAPY TREATMENT  Patient Name: Lisa Klein MRN: [id number] DOB:Jan 06, 1930, 88 y.o., female Today's Date: 03/07/2022   PT End of Session - 03/07/22 1127     Visit Number 11    Number of Visits 18    Date for PT Re-Evaluation 04/02/22    Authorization Type [employer] H[employer] (auth required, no visit limit based on med necessity)    Authorization Time Period 8 visits approved 7/31-8/28    Authorization - Visit Number 2    Authorization - Number of Visits 8    Progress Note Due on Visit 20    PT Start Time 1120    PT Stop Time 1200    PT Time Calculation (min) 40 min                 Past Medical History:  Diagnosis Date   Cancer ([place]) 2012   LEFT BREAST   Clotting disorder ([place]) 05/2011   dvt, right leg   Degenerative disc disease    with nerve compression    DJD (degenerative joint disease)    Hypertension    Hypertension    Hypothyroidism    Hypothyroidism    Long term current use of anticoagulant therapy 12/07/2014   Pulmonary embolus    Obesity    Osteopenia 08/24/2014   Past Surgical History:  Procedure Laterality Date   ABDOMINAL HYSTERECTOMY  1980   fibroids   BREAST SURGERY  2012   left total mastectomy   CHOLECYSTECTOMY  80'S   [facility]   CHOLECYSTECTOMY     HIP PINNING,CANNULATED Left 01/19/2013   Procedure: CANNULATED HIP PINNING-  left;  Surgeon: Marin Shutter, MD;  Location: [facility];  Service: Orthopedics;  Laterality: Left;   MASTECTOMY MODIFIED RADICAL  02/14/11   left   PORT-A-CATH REMOVAL N/A 12/05/2012   Procedure: MINOR REMOVAL PORT-A-CATH;  Surgeon: Jamesetta So, MD;  Location: [facility];  Service: General;  Laterality: N/A;  In Minor Room   PORTACATH PLACEMENT  04/16/2011   Procedure: INSERTION PORT-A-CATH;  Surgeon: Jamesetta So;  Location: [facility];  Service: General;  Laterality: Right;  right subclavian   VESICOVAGINAL FISTULA CLOSURE W/ TAH     [facility]   Patient Active Problem List   Diagnosis Date Noted   Bilateral  shoulder pain 12/19/2021   Fatigue 03/27/2021   Shortness of breath 03/27/2021   GERD (gastroesophageal reflux disease) 03/27/2021   Knee pain, left 02/15/2020   Generalized osteoarthritis of multiple sites 04/02/2018   Allergic rhinitis 09/01/2015   Obesity 12/13/2014   Long term current use of anticoagulant therapy 12/07/2014   Osteopenia 08/24/2014   Infiltrating ductal carcinoma of breast ([place]) 04/10/2011   Hyperlipemia 11/07/2007   Hypothyroidism 08/15/2007   Essential hypertension 08/15/2007    PCP: [name] MD  REFERRING PROVIDER: [name] MD  REFERRING DIAG: M25.511,G89.29,M25.512 (ICD-10-CM) - Chronic pain of both shoulders M25.562,G89.29 (ICD-10-CM) - Chronic pain of left knee   THERAPY DIAG:  Chronic pain of left knee  Other abnormalities of gait and mobility  Muscle weakness (generalized)  Chronic right shoulder pain  Rationale for Evaluation and Treatment Rehabilitation  ONSET DATE: approximately 2013  SUBJECTIVE:   SUBJECTIVE STATEMENT: Soreness and stiffness in hips, knees and shoulders today.  Reports knee pain when she walks.  PERTINENT HISTORY:  Right shoulder soreness and limited AROM, left arm soreness but more ROM  Left knee pain with genu valgum  Severe arthritis There is severe tricompartmental degenerative joint disease  of the left knee with marked bone on bone and osteophytes more laterally and knock-knee deformity present.  No fracture or loose body noted.  Bone quality is good.  Breast cancer in 2012  Knee pain, left Increased pain , stiffness and instability    Bilateral shoulder pain Pain , stiffness and reduced shoulder mobility right greater than left  History of left hip fx with ORIF       PAIN:  Are you having pain? Yes: NPRS scale: left shoulder 3/10 knee at worst 10/10 currently 6/10 Pain location: right shoulder and left knee Pain description: shoulder aching. Knee aching Aggravating factors: standing  and getting up or using arm in adls Relieving factors: massaging area  PRECAUTIONS: None  WEIGHT BEARING RESTRICTIONS No  FALLS:  Has patient fallen in last 6 months? No and patient states she has had near falls but catches herself with hands on wall etc.    LIVING ENVIRONMENT: Lives with: lives alone Lives in: House/apartment Stairs:  stairs outside to basement and on back deck but hasn't done them in 15 years.  Also has attic in home but again has not done in many years.  Has following equipment at home: Quad cane small base, Environmental consultant - 2 wheeled, Environmental consultant - 4 wheeled, shower chair, and Grab bars  OCCUPATION: retired  PLOF: Independent with household mobility with device and Independent with homemaking with ambulation  PATIENT GOALS   -Primary improve strength of knee, try to decrease pain, more steady on feet.  -Secondary improve right shoulder strength, improve range of right shoulder, and decrease pain.    OBJECTIVE:   DIAGNOSTIC FINDINGS:  Impression:  Severe tricompartmental degenerative joint disease with marked bone on bone laterally  PATIENT SURVEYS:  FOTO 50  COGNITION:  Overall cognitive status: Within functional limits for tasks assessed     SENSATION: WFL  EDEMA:  In left knee; medial and proximal to patella    POSTURE: rounded shoulders, forward head, and increased thoracic kyphosis    LOWER EXTREMITY MMT:  MMT Right eval Left eval  Hip flexion 4 4  Hip extension 3 3  Hip abduction    Hip adduction    Hip internal rotation    Hip external rotation    Knee flexion    Knee extension 3 (pts knee gives in pain with cracking with minimal pressure) 3-  Ankle dorsiflexion 3 with minimal pressure knees start cracking) 3(with minimal pressure knees start cracking)  Ankle plantarflexion    Ankle inversion 3 3  Ankle eversion 3    (Blank rows = not tested)  LOWER EXTREMITY ROM:  AROM Right eval Left eval  Knee flexion 100 80  Knee extension -5  -20    UPPER EXTREMITY ROM:  Active ROM Right eval Left eval Right 03/01/22  Shoulder flexion 50 AROM 120 degrees and then start of pain  127 AROM in sitting  Shoulder extension     Shoulder abduction 60 degrees AROM  130 PROM     (Blank rows = not tested)  UPPER EXTREMITY MMT:  MMT Right eval Left eval Right 03/01/22  Shoulder flexion 3-  4  Shoulder extension     Shoulder abduction 3-    Elbow extension/ flexion 3+  4+/4+  Wrist flexion 3+  4+   (Blank rows = not tested)   FUNCTIONAL TESTS:  5 times sit to stand: 80.46 seconds with b/l UE support Timed up and go (TUG): to Left 60.79 seconds with NBQC  GAIT: Distance  walked: 100' Assistive device utilized: Lobbyist Level of assistance: SBA and CGA Comments: increwased trunk flexion, decreased cadence, short step length, poor placement of cane on ground, decreased weightbearing on left LE  Sit to stand  B/L ue use at Close supervision, pt with large BOS and must come up in increments, does not get knees flexed enough   TODAY'S TREATMENT: 03/07/2022 Nustep seat 10, arms 8 x 5 min for mobility and endurance above 55 SPM Seated:  Pulleys 2' flexion, 2' abduction  Scapular retraction 2X10 RTB  IR/ER 2X10 RTB  Shoulder extensions 2X10 RTB  UE press up with 2# DB 2X10 each Standing: // bars Heel raises x 20 Marching x 20 alternating Tandem stance x 10 sec each; CGA for safety   Hip Abduction 10X2 each in upright posturing   Hip Extension 10X2 each in upright posturing  03/05/2022 Nustep seat 10, arms 8 x 5 min for mobility and endurance above 55 SPM Seated:  Pulleys 2' flexion, 2' abduction  Scapular retraction 2X10 RTB  IR/ER 2X10 RTB  Shoulder extensions 2X10 RTB Standing: // bars Heel raises x 20 Marching x 20 alternating Tandem stance x 10 sec each; CGA for safety  Hip Abduction 10X each in upright posturing  Hip Extension 10X each in upright posturing   kinesiotape to Left knee for  stability   03/01/22 FOTO 54 Progress note Pulley 2' flexion and 2' abduction TUG 51 sec today with QC 5 x sit to stand 55 sec today (goal met) Left knee kinesiotape for support  02/27/22 Pulley 2' flexion and 2' abduction  Scapular retraction 2 x 10 RTB Cervical retraction 2 x 10 IR/ER 2 x 10 RTB bilat (uses 2 bands/2 handles)  Nustep seat 10, arms 9 x 7 min for mobility and endurance  Standing: // bars Lumbar extension x 10 Heel raises x 10 Marching x 10 Tandem stance x 10 sec each; CGA for safety   02/21/22 Seated: Pulley 2' each flexion and abduction Scapular retraction 2 x 10 Cervical retraction 2 x 10 IR/ER 2 x 10 RTB bilat  Sit to stand x 5 1 min and 3 sec (63 sec)  Nustep seat 10, arms 9 x 7 min for mobility and endurance  Standing: Marching x 10 Heel raises x 10   02/19/22 Nustep level 1 x5 mins with b/l UE/LE kinesiotape to Left knee for stability Step up anterior x10 reps b/l onto 4 inch step Lateral step up 4 inch step x10 reps Side steps in ll bars with yellow theraband resistance mid calf in //s 6 laps Walking march b/l UE support x6laps Pull to stand at // bars with 2 pillows and foam on top of chair 5 reps x2 sets, rest between  02/14/22 Pulley 2' each flexion and abduction Scapular retraction RTB 2 x 10 IR/ER 2 x 10 RTB  Trial of kinesiotape to Left knee for stability  Standing:  Heel toe raises x 10 Hip ext x 5 each March x 5 each Hip abduction x 5 each  02/12/22 UE pulleys into flexion and abd x2 mins Step up anterior x5 reps b/l onto 4 inch step Lateral step up 4 inch step x6 reps Attempted mini knee bends x5 Heel raises x15 Nustep level 1 x6 mins, half rom to begin into full rom Mini bridge x10 Supine marches x10 Mini crunch with UE tap to opposite knee x10 SLR x10    02/08/22 Seated:  LAQ 10X5" each LE AROM  Hip  flexion 10X alternating bilaterally AROM  Sit to stand with less UE assist 5X Supine: Heelslides 10X each  LE  Bridging with knees bent in available ROM  PVC pipe Rt UE flexion AAROM with Lt assist 5X  PVC pipe Rt UE Abduction AAROM with Lt assit 5X  Hip abduction 10X each  01/17/22 HEP established, goals reviewed Seated:  LAQ 10X5" each LE  Hip flexion 10X alternating bilaterally Supine: Heelslides 10X each LE  Bridging with knees bent in available ROM  PVC pipe Rt UE flexion AAROM with Lt assist 5X  PVC pipe Rt UE Abduction AAROM with Lt assit 5X  PATIENT EDUCATION:  Education details: 6/14: Goals reviewed, HEP initiated; evaluation: Patient educated on exam findings, POC, scope of PT. Person educated: Patient Education method: Explanation, Demonstration, and Handouts Education comprehension: verbalized understanding, returned demonstration, verbal cues required, and tactile cues required    HOME EXERCISE PROGRAM: Access Code: [email or web address] URL: [email or web address] Date: 02/14/2022 Prepared by: [facility]  Exercises - Standing Hip Extension with Counter Support  - 2 x daily - 7 x weekly - 2 sets - 5 reps - Standing Hip Abduction with Counter Support  - 2 x daily - 7 x weekly - 2 sets - 5 reps - Standing March with Counter Support  - 2 x daily - 7 x weekly - 2 sets - 5 reps  Access Code: [email or web address] URL: [email or web address] Date: 01/17/2022 Prepared by: Roseanne Reno  Exercises - Seated Long Arc Quad  - 2 x daily - 7 x weekly - 2 sets - 10 reps - 5 seconds hold - Seated March  - 2 x daily - 7 x weekly - 2 sets - 10 reps - Supine Heel Slide  - 2 x daily - 7 x weekly - 2 sets - 10 reps - Bridge  - 2 x daily - 7 x weekly - 2 sets - 10 reps - Supine Shoulder Flexion with Dowel  - 2 x daily - 7 x weekly - 2 sets - 10 reps - Supine Shoulder Abduction AAROM with Dowel  - 2 x daily - 7 x weekly - 2 sets - 10 reps  ASSESSMENT:  CLINICAL IMPRESSION: Continued focus on UE and LE strengthening/stability.  Able to increase to 2 sets of hip exercises with continues  postural cues needed to remain upright.  Pt able to complete all standing exercises with only one seated rest break. Kinesiotape remained stable on knee this session.  Patient will benefit from continued skilled therapy interventions to address deficits and improve functional mobility.   OBJECTIVE IMPAIRMENTS Abnormal gait, decreased activity tolerance, decreased balance, decreased endurance, decreased knowledge of use of DME, decreased mobility, difficulty walking, decreased ROM, decreased strength, increased edema, increased fascial restrictions, impaired flexibility, impaired UE functional use, postural dysfunction, and pain.   ACTIVITY LIMITATIONS carrying, lifting, bending, standing, squatting, stairs, transfers, bed mobility, bathing, toileting, dressing, reach over head, and locomotion level  PARTICIPATION LIMITATIONS: cleaning, laundry, shopping, community activity, and yard work  PERSONAL FACTORS Age and 3+ comorbidities:    Allergies, Arthritis, Back pain, BMI over 30, Cancer, Hearing Impairment, High Blood Pressure, Osteoporosis, Prior Surgeryare also affecting patient's functional outcome.   REHAB POTENTIAL: Fair chronicity of impairment, multi areas affected  CLINICAL DECISION MAKING: Stable/uncomplicated  EVALUATION COMPLEXITY: High   GOALS: Goals reviewed with patient? Yes  SHORT TERM GOALS: Target date: 02/09/2022  Patient will be independent with HEP in order to improve functional outcomes. Baseline:  Goal status: met  2.  Patient will report at least 25% improvement in symptoms for improved quality of life. Baseline:  Goal status: met  3.  Patient will improve five time sit to stand by 20 seconds to work towards improved efficiency in transfer and demonstrate improving LE strength. Baseline: 5 times sit to stand: 80.46 seconds with b/l UE support; 02/21/22 63 sec; 03/01/22 55 sec Goal status:goal met  4.  Pt will improve TUG by 10 seconds to demonstrate improving  efficiency in ambulation Baseline: 03/01/22 51 sec Timed up and go (TUG): to Left 60.79 seconds with NBQC Goal status: IN PROGRESS  5.  Pt will improve right shoulder arom to 70 degrees to work towards improved functional use in adls Baseline: 50 degrees Goal status:met    LONG TERM GOALS: Target date: 03/09/2022  1.  Patient will improve FOTO score by at least 10 points in order to indicate improved tolerance to activity. Baseline: 50 Goal status: IN PROGRESS  2. Patient will improve five time sit to stand by 40 seconds to work towards improved efficiency in transfer and demonstrate improving LE strength. Baseline: 5 times sit to stand: 80.46 seconds with b/l UE support Goal status: IN PROGRESS  4.  Pt will improve TUG by 20 seconds to demonstrate improving efficiency in ambulation Baseline:  Timed up and go (TUG): to Left 60.79 seconds with NBQC Goal status: IN PROGRESS  5.  Pt will improve right shoulder arom to 90 degrees to work towards improved functional use in adls Baseline: 50 degrees Goal status: IN PROGRESS    PLAN: PT FREQUENCY: 2x/week  PT DURATION: 8 weeks  PLANNED INTERVENTIONS: PLANNED INTERVENTIONS: Therapeutic exercises, Therapeutic activity, Neuromuscular re-education, Balance training, Gait training, Patient/Family education, Joint manipulation, Joint mobilization, Stair training, Orthotic/Fit training, DME instructions, Aquatic Therapy, Dry Needling, Electrical stimulation, Spinal manipulation, Spinal mobilization, Cryotherapy, Moist heat, Compression bandaging, scar mobilization, Splintting, Taping, Traction, Ultrasound, Ionotophoresis 30m/ml Dexamethasone, and Manual therapy   PLAN FOR NEXT SESSION: continue to build right shoulder strength and ROM as well as B/L LE strength and stretching.  Recommend continued therapy to address unmet and partially met goals.  Apply kinesiotape to Lt knee as needed for stability.    11:27 AM, 03/07/22 ATeena Irani  PTA/CLT CAnaktuvuk PassPh: [phone]

## 2022-03-13 ENCOUNTER — Ambulatory Visit (HOSPITAL_COMMUNITY): Payer: Medicare HMO | Admitting: Physical Therapy

## 2022-03-13 DIAGNOSIS — Z7982 Long term (current) use of aspirin: Secondary | ICD-10-CM | POA: Diagnosis not present

## 2022-03-13 DIAGNOSIS — M8580 Other specified disorders of bone density and structure, unspecified site: Secondary | ICD-10-CM | POA: Diagnosis not present

## 2022-03-13 DIAGNOSIS — Z7901 Long term (current) use of anticoagulants: Secondary | ICD-10-CM | POA: Diagnosis not present

## 2022-03-13 DIAGNOSIS — E559 Vitamin D deficiency, unspecified: Secondary | ICD-10-CM | POA: Diagnosis not present

## 2022-03-13 DIAGNOSIS — Z170 Estrogen receptor positive status [ER+]: Secondary | ICD-10-CM | POA: Diagnosis not present

## 2022-03-13 DIAGNOSIS — C50912 Malignant neoplasm of unspecified site of left female breast: Secondary | ICD-10-CM | POA: Diagnosis not present

## 2022-03-13 DIAGNOSIS — M6281 Muscle weakness (generalized): Secondary | ICD-10-CM

## 2022-03-13 DIAGNOSIS — Z9012 Acquired absence of left breast and nipple: Secondary | ICD-10-CM | POA: Diagnosis not present

## 2022-03-13 DIAGNOSIS — G8929 Other chronic pain: Secondary | ICD-10-CM

## 2022-03-13 DIAGNOSIS — Z86718 Personal history of other venous thrombosis and embolism: Secondary | ICD-10-CM | POA: Diagnosis not present

## 2022-03-13 DIAGNOSIS — R2689 Other abnormalities of gait and mobility: Secondary | ICD-10-CM

## 2022-03-13 DIAGNOSIS — Z86711 Personal history of pulmonary embolism: Secondary | ICD-10-CM | POA: Diagnosis not present

## 2022-03-13 NOTE — Therapy (Signed)
OUTPATIENT PHYSICAL THERAPY TREATMENT  Patient Name: Lisa Klein MRN: [id number] DOB:06/14/1930, 88 y.o., female Today's Date: 03/13/2022   PT End of Session - 03/13/22 1354     Visit Number 12    Number of Visits 18    Date for PT Re-Evaluation 04/02/22    Authorization Type [employer] H[employer] (auth required, no visit limit based on med necessity)    Authorization Time Period 8 visits approved 7/31-8/28    Authorization - Visit Number 3    Authorization - Number of Visits 8    Progress Note Due on Visit 82    PT Start Time 9449    PT Stop Time 1430    PT Time Calculation (min) 42 min                 Past Medical History:  Diagnosis Date   Cancer ([place]) 2012   LEFT BREAST   Clotting disorder ([place]) 05/2011   dvt, right leg   Degenerative disc disease    with nerve compression    DJD (degenerative joint disease)    Hypertension    Hypertension    Hypothyroidism    Hypothyroidism    Long term current use of anticoagulant therapy 12/07/2014   Pulmonary embolus    Obesity    Osteopenia 08/24/2014   Past Surgical History:  Procedure Laterality Date   ABDOMINAL HYSTERECTOMY  1980   fibroids   BREAST SURGERY  2012   left total mastectomy   CHOLECYSTECTOMY  80'S   [facility]   CHOLECYSTECTOMY     HIP PINNING,CANNULATED Left 01/19/2013   Procedure: CANNULATED HIP PINNING-  left;  Surgeon: Marin Shutter, MD;  Location: [facility];  Service: Orthopedics;  Laterality: Left;   MASTECTOMY MODIFIED RADICAL  02/14/11   left   PORT-A-CATH REMOVAL N/A 12/05/2012   Procedure: MINOR REMOVAL PORT-A-CATH;  Surgeon: Jamesetta So, MD;  Location: [facility];  Service: General;  Laterality: N/A;  In Minor Room   PORTACATH PLACEMENT  04/16/2011   Procedure: INSERTION PORT-A-CATH;  Surgeon: Jamesetta So;  Location: [facility];  Service: General;  Laterality: Right;  right subclavian   VESICOVAGINAL FISTULA CLOSURE W/ TAH     [facility]   Patient Active Problem List   Diagnosis Date Noted   Bilateral  shoulder pain 12/19/2021   Fatigue 03/27/2021   Shortness of breath 03/27/2021   GERD (gastroesophageal reflux disease) 03/27/2021   Knee pain, left 02/15/2020   Generalized osteoarthritis of multiple sites 04/02/2018   Allergic rhinitis 09/01/2015   Obesity 12/13/2014   Long term current use of anticoagulant therapy 12/07/2014   Osteopenia 08/24/2014   Infiltrating ductal carcinoma of breast ([place]) 04/10/2011   Hyperlipemia 11/07/2007   Hypothyroidism 08/15/2007   Essential hypertension 08/15/2007    PCP: [name] MD  REFERRING PROVIDER: [name] MD  REFERRING DIAG: M25.511,G89.29,M25.512 (ICD-10-CM) - Chronic pain of both shoulders M25.562,G89.29 (ICD-10-CM) - Chronic pain of left knee   THERAPY DIAG:  Chronic pain of left knee  Other abnormalities of gait and mobility  Muscle weakness (generalized)  Chronic right shoulder pain  Rationale for Evaluation and Treatment Rehabilitation  ONSET DATE: approximately 2013  SUBJECTIVE:   SUBJECTIVE STATEMENT: Pt reports she is having more pain in her knees than normally, unbearable with weight bearing.  States the Lt knee is more sore than her Rt.  States the Shoulders are not bothering her today.  PERTINENT HISTORY:  Right shoulder soreness and limited AROM, left arm soreness  but more ROM  Left knee pain with genu valgum  Severe arthritis There is severe tricompartmental degenerative joint disease of the left knee with marked bone on bone and osteophytes more laterally and knock-knee deformity present.  No fracture or loose body noted.  Bone quality is good.  Breast cancer in 2012  Knee pain, left Increased pain , stiffness and instability    Bilateral shoulder pain Pain , stiffness and reduced shoulder mobility right greater than left  History of left hip fx with ORIF       PAIN:  Are you having pain? Yes: NPRS scale: bilateral knees, Lt>>Rt standing 10/10 Pain location: right shoulder and left  knee Pain description: shoulder aching. Knee aching Aggravating factors: standing and getting up or using arm in adls Relieving factors: massaging area  PRECAUTIONS: None  WEIGHT BEARING RESTRICTIONS No  FALLS:  Has patient fallen in last 6 months? No and patient states she has had near falls but catches herself with hands on wall etc.    LIVING ENVIRONMENT: Lives with: lives alone Lives in: House/apartment Stairs:  stairs outside to basement and on back deck but hasn't done them in 15 years.  Also has attic in home but again has not done in many years.  Has following equipment at home: Quad cane small base, Environmental consultant - 2 wheeled, Environmental consultant - 4 wheeled, shower chair, and Grab bars  OCCUPATION: retired  PLOF: Independent with household mobility with device and Independent with homemaking with ambulation  PATIENT GOALS   -Primary improve strength of knee, try to decrease pain, more steady on feet.  -Secondary improve right shoulder strength, improve range of right shoulder, and decrease pain.    OBJECTIVE:   DIAGNOSTIC FINDINGS:  Impression:  Severe tricompartmental degenerative joint disease with marked bone on bone laterally  PATIENT SURVEYS:  FOTO 50  COGNITION:  Overall cognitive status: Within functional limits for tasks assessed     SENSATION: WFL  EDEMA:  In left knee; medial and proximal to patella    POSTURE: rounded shoulders, forward head, and increased thoracic kyphosis    LOWER EXTREMITY MMT:  MMT Right eval Left eval  Hip flexion 4 4  Hip extension 3 3  Hip abduction    Hip adduction    Hip internal rotation    Hip external rotation    Knee flexion    Knee extension 3 (pts knee gives in pain with cracking with minimal pressure) 3-  Ankle dorsiflexion 3 with minimal pressure knees start cracking) 3(with minimal pressure knees start cracking)  Ankle plantarflexion    Ankle inversion 3 3  Ankle eversion 3    (Blank rows = not tested)  LOWER  EXTREMITY ROM:  AROM Right eval Left eval  Knee flexion 100 80  Knee extension -5 -20    UPPER EXTREMITY ROM:  Active ROM Right eval Left eval Right 03/01/22  Shoulder flexion 50 AROM 120 degrees and then start of pain  127 AROM in sitting  Shoulder extension     Shoulder abduction 60 degrees AROM  130 PROM     (Blank rows = not tested)  UPPER EXTREMITY MMT:  MMT Right eval Left eval Right 03/01/22  Shoulder flexion 3-  4  Shoulder extension     Shoulder abduction 3-    Elbow extension/ flexion 3+  4+/4+  Wrist flexion 3+  4+   (Blank rows = not tested)   FUNCTIONAL TESTS:  5 times sit to stand: 80.46 seconds with b/l  UE support Timed up and go (TUG): to Left 60.79 seconds with NBQC  GAIT: Distance walked: 100' Assistive device utilized: Herbalist base Level of assistance: SBA and CGA Comments: increwased trunk flexion, decreased cadence, short step length, poor placement of cane on ground, decreased weightbearing on left LE  Sit to stand  B/L ue use at Close supervision, pt with large BOS and must come up in increments, does not get knees flexed enough   TODAY'S TREATMENT: 03/13/2022 Nustep seat 9, arms 7 x 5 min for mobility and endurance above 55 SPM level 3 Seated:Scapular retraction 2X10 RTB IR/ER 2X10 RTB UE press up with 2# DB 2X10 each Standing: at counter Heel raises x 20 Marching x 20 alternating Tandem stance x 10 sec each; CGA for safety  Hip Abduction 10X2 each in upright posturing  Hip Extension 10X2 each in upright posturing  03/07/2022 Nustep seat 10, arms 8 x 5 min for mobility and endurance above 55 SPM Seated:  Pulleys 2' flexion, 2' abduction  Scapular retraction 2X10 RTB  IR/ER 2X10 RTB  Shoulder extensions 2X10 RTB  UE press up with 2# DB 2X10 each Standing: // bars Heel raises x 20 Marching x 20 alternating Tandem stance x 10 sec each; CGA for safety   Hip Abduction 10X2 each in upright posturing   Hip Extension 10X2  each in upright posturing  03/05/2022 Nustep seat 10, arms 8 x 5 min for mobility and endurance above 55 SPM Seated:  Pulleys 2' flexion, 2' abduction  Scapular retraction 2X10 RTB  IR/ER 2X10 RTB  Shoulder extensions 2X10 RTB Standing: // bars Heel raises x 20 Marching x 20 alternating Tandem stance x 10 sec each; CGA for safety  Hip Abduction 10X each in upright posturing  Hip Extension 10X each in upright posturing   kinesiotape to Left knee for stability   03/01/22 FOTO 54 Progress note Pulley 2' flexion and 2' abduction TUG 51 sec today with QC 5 x sit to stand 55 sec today (goal met) Left knee kinesiotape for support  02/27/22 Pulley 2' flexion and 2' abduction  Scapular retraction 2 x 10 RTB Cervical retraction 2 x 10 IR/ER 2 x 10 RTB bilat (uses 2 bands/2 handles)  Nustep seat 10, arms 9 x 7 min for mobility and endurance  Standing: // bars Lumbar extension x 10 Heel raises x 10 Marching x 10 Tandem stance x 10 sec each; CGA for safety   02/21/22 Seated: Pulley 2' each flexion and abduction Scapular retraction 2 x 10 Cervical retraction 2 x 10 IR/ER 2 x 10 RTB bilat  Sit to stand x 5 1 min and 3 sec (63 sec)  Nustep seat 10, arms 9 x 7 min for mobility and endurance  Standing: Marching x 10 Heel raises x 10   02/19/22 Nustep level 1 x5 mins with b/l UE/LE kinesiotape to Left knee for stability Step up anterior x10 reps b/l onto 4 inch step Lateral step up 4 inch step x10 reps Side steps in ll bars with yellow theraband resistance mid calf in //s 6 laps Walking march b/l UE support x6laps Pull to stand at // bars with 2 pillows and foam on top of chair 5 reps x2 sets, rest between  02/14/22 Pulley 2' each flexion and abduction Scapular retraction RTB 2 x 10 IR/ER 2 x 10 RTB  Trial of kinesiotape to Left knee for stability  Standing:  Heel toe raises x 10 Hip ext x 5 each March  x 5 each Hip abduction x 5 each  02/12/22 UE pulleys into  flexion and abd x2 mins Step up anterior x5 reps b/l onto 4 inch step Lateral step up 4 inch step x6 reps Attempted mini knee bends x5 Heel raises x15 Nustep level 1 x6 mins, half rom to begin into full rom Mini bridge x10 Supine marches x10 Mini crunch with UE tap to opposite knee x10 SLR x10    02/08/22 Seated:  LAQ 10X5" each LE AROM  Hip flexion 10X alternating bilaterally AROM  Sit to stand with less UE assist 5X Supine: Heelslides 10X each LE  Bridging with knees bent in available ROM  PVC pipe Rt UE flexion AAROM with Lt assist 5X  PVC pipe Rt UE Abduction AAROM with Lt assit 5X  Hip abduction 10X each  01/17/22 HEP established, goals reviewed Seated:  LAQ 10X5" each LE  Hip flexion 10X alternating bilaterally Supine: Heelslides 10X each LE  Bridging with knees bent in available ROM  PVC pipe Rt UE flexion AAROM with Lt assist 5X  PVC pipe Rt UE Abduction AAROM with Lt assit 5X  PATIENT EDUCATION:  Education details: 6/14: Goals reviewed, HEP initiated; evaluation: Patient educated on exam findings, POC, scope of PT. Person educated: Patient Education method: Explanation, Demonstration, and Handouts Education comprehension: verbalized understanding, returned demonstration, verbal cues required, and tactile cues required    HOME EXERCISE PROGRAM: Access Code: [email or web address] URL: [email or web address] Date: 02/14/2022 Prepared by: [facility]  Exercises - Standing Hip Extension with Counter Support  - 2 x daily - 7 x weekly - 2 sets - 5 reps - Standing Hip Abduction with Counter Support  - 2 x daily - 7 x weekly - 2 sets - 5 reps - Standing March with Counter Support  - 2 x daily - 7 x weekly - 2 sets - 5 reps  Access Code: [email or web address] URL: [email or web address] Date: 01/17/2022 Prepared by: Roseanne Reno  Exercises - Seated Long Arc Quad  - 2 x daily - 7 x weekly - 2 sets - 10 reps - 5 seconds hold - Seated March  - 2 x daily - 7 x weekly -  2 sets - 10 reps - Supine Heel Slide  - 2 x daily - 7 x weekly - 2 sets - 10 reps - Bridge  - 2 x daily - 7 x weekly - 2 sets - 10 reps - Supine Shoulder Flexion with Dowel  - 2 x daily - 7 x weekly - 2 sets - 10 reps - Supine Shoulder Abduction AAROM with Dowel  - 2 x daily - 7 x weekly - 2 sets - 10 reps  ASSESSMENT:  CLINICAL IMPRESSION: Continued focus on UE and LE strengthening/stability.  More seated breaks needed today due to knee discomfort.  Minimal ROM in bil shoulders with press ups and postural cues with scap retractions as tends to slump back into seat.  Patient will benefit from continued skilled therapy interventions to address deficits and improve functional mobility.   OBJECTIVE IMPAIRMENTS Abnormal gait, decreased activity tolerance, decreased balance, decreased endurance, decreased knowledge of use of DME, decreased mobility, difficulty walking, decreased ROM, decreased strength, increased edema, increased fascial restrictions, impaired flexibility, impaired UE functional use, postural dysfunction, and pain.   ACTIVITY LIMITATIONS carrying, lifting, bending, standing, squatting, stairs, transfers, bed mobility, bathing, toileting, dressing, reach over head, and locomotion level  PARTICIPATION LIMITATIONS: cleaning, laundry, shopping, community activity, and yard work  PERSONAL FACTORS Age and 3+ comorbidities:  Allergies, Arthritis, Back pain, BMI over 30, Cancer, Hearing Impairment, High Blood Pressure, Osteoporosis, Prior Surgeryare also affecting patient's functional outcome.   REHAB POTENTIAL: Fair chronicity of impairment, multi areas affected  CLINICAL DECISION MAKING: Stable/uncomplicated  EVALUATION COMPLEXITY: High   GOALS: Goals reviewed with patient? Yes  SHORT TERM GOALS: Target date: 02/09/2022  Patient will be independent with HEP in order to improve functional outcomes. Baseline:  Goal status: met  2.  Patient will report at least 25% improvement  in symptoms for improved quality of life. Baseline:  Goal status: met  3.  Patient will improve five time sit to stand by 20 seconds to work towards improved efficiency in transfer and demonstrate improving LE strength. Baseline: 5 times sit to stand: 80.46 seconds with b/l UE support; 02/21/22 63 sec; 03/01/22 55 sec Goal status:goal met  4.  Pt will improve TUG by 10 seconds to demonstrate improving efficiency in ambulation Baseline: 03/01/22 51 sec Timed up and go (TUG): to Left 60.79 seconds with NBQC Goal status: IN PROGRESS  5.  Pt will improve right shoulder arom to 70 degrees to work towards improved functional use in adls Baseline: 50 degrees Goal status:met    LONG TERM GOALS: Target date: 03/09/2022  1.  Patient will improve FOTO score by at least 10 points in order to indicate improved tolerance to activity. Baseline: 50 Goal status: IN PROGRESS  2. Patient will improve five time sit to stand by 40 seconds to work towards improved efficiency in transfer and demonstrate improving LE strength. Baseline: 5 times sit to stand: 80.46 seconds with b/l UE support Goal status: IN PROGRESS  4.  Pt will improve TUG by 20 seconds to demonstrate improving efficiency in ambulation Baseline:  Timed up and go (TUG): to Left 60.79 seconds with NBQC Goal status: IN PROGRESS  5.  Pt will improve right shoulder arom to 90 degrees to work towards improved functional use in adls Baseline: 50 degrees Goal status: IN PROGRESS    PLAN: PT FREQUENCY: 2x/week  PT DURATION: 8 weeks  PLANNED INTERVENTIONS: PLANNED INTERVENTIONS: Therapeutic exercises, Therapeutic activity, Neuromuscular re-education, Balance training, Gait training, Patient/Family education, Joint manipulation, Joint mobilization, Stair training, Orthotic/Fit training, DME instructions, Aquatic Therapy, Dry Needling, Electrical stimulation, Spinal manipulation, Spinal mobilization, Cryotherapy, Moist heat, Compression  bandaging, scar mobilization, Splintting, Taping, Traction, Ultrasound, Ionotophoresis 55m/ml Dexamethasone, and Manual therapy   PLAN FOR NEXT SESSION: continue to build right shoulder strength and ROM as well as B/L LE strength and stretching.  Recommend continued therapy to address unmet and partially met goals.  Apply kinesiotape to Lt knee as needed for stability.    1:55 PM, 03/13/22 ATeena Irani PTA/CLT CLockhartPh: [phone]

## 2022-03-15 ENCOUNTER — Inpatient Hospital Stay: Payer: Medicare HMO | Attending: Physician Assistant

## 2022-03-15 ENCOUNTER — Ambulatory Visit (HOSPITAL_COMMUNITY): Payer: Medicare HMO | Admitting: Physical Therapy

## 2022-03-15 DIAGNOSIS — Z170 Estrogen receptor positive status [ER+]: Secondary | ICD-10-CM | POA: Insufficient documentation

## 2022-03-15 DIAGNOSIS — E559 Vitamin D deficiency, unspecified: Secondary | ICD-10-CM | POA: Diagnosis not present

## 2022-03-15 DIAGNOSIS — Z86711 Personal history of pulmonary embolism: Secondary | ICD-10-CM | POA: Diagnosis not present

## 2022-03-15 DIAGNOSIS — G8929 Other chronic pain: Secondary | ICD-10-CM

## 2022-03-15 DIAGNOSIS — R2689 Other abnormalities of gait and mobility: Secondary | ICD-10-CM

## 2022-03-15 DIAGNOSIS — Z7982 Long term (current) use of aspirin: Secondary | ICD-10-CM | POA: Diagnosis not present

## 2022-03-15 DIAGNOSIS — Z923 Personal history of irradiation: Secondary | ICD-10-CM | POA: Insufficient documentation

## 2022-03-15 DIAGNOSIS — Z9012 Acquired absence of left breast and nipple: Secondary | ICD-10-CM | POA: Diagnosis not present

## 2022-03-15 DIAGNOSIS — C50912 Malignant neoplasm of unspecified site of left female breast: Secondary | ICD-10-CM | POA: Insufficient documentation

## 2022-03-15 DIAGNOSIS — M8580 Other specified disorders of bone density and structure, unspecified site: Secondary | ICD-10-CM | POA: Diagnosis not present

## 2022-03-15 DIAGNOSIS — Z86718 Personal history of other venous thrombosis and embolism: Secondary | ICD-10-CM | POA: Diagnosis not present

## 2022-03-15 DIAGNOSIS — Z8261 Family history of arthritis: Secondary | ICD-10-CM | POA: Insufficient documentation

## 2022-03-15 DIAGNOSIS — M6281 Muscle weakness (generalized): Secondary | ICD-10-CM

## 2022-03-15 DIAGNOSIS — Z7901 Long term (current) use of anticoagulants: Secondary | ICD-10-CM | POA: Insufficient documentation

## 2022-03-15 DIAGNOSIS — D72819 Decreased white blood cell count, unspecified: Secondary | ICD-10-CM | POA: Insufficient documentation

## 2022-03-15 DIAGNOSIS — Z8249 Family history of ischemic heart disease and other diseases of the circulatory system: Secondary | ICD-10-CM | POA: Insufficient documentation

## 2022-03-15 DIAGNOSIS — Z79899 Other long term (current) drug therapy: Secondary | ICD-10-CM | POA: Insufficient documentation

## 2022-03-15 LAB — COMPREHENSIVE METABOLIC PANEL
ALT: 19 U/L (ref 0–44)
AST: 23 U/L (ref 15–41)
Albumin: 4 g/dL (ref 3.5–5.0)
Alkaline Phosphatase: 32 U/L — ABNORMAL LOW (ref 38–126)
Anion gap: 6 (ref 5–15)
BUN: 14 mg/dL (ref 8–23)
CO2: 26 mmol/L (ref 22–32)
Calcium: 9.4 mg/dL (ref 8.9–10.3)
Chloride: 106 mmol/L (ref 98–111)
Creatinine, Ser: 0.9 mg/dL (ref 0.44–1.00)
GFR, Estimated: >60 mL/min (ref >60)
Glucose, Bld: 116 mg/dL — ABNORMAL HIGH (ref 70–99)
Potassium: 3.7 mmol/L (ref 3.5–5.1)
Sodium: 138 mmol/L (ref 135–145)
Total Bilirubin: 0.9 mg/dL (ref 0.3–1.2)
Total Protein: 7.2 g/dL (ref 6.5–8.1)

## 2022-03-15 LAB — CBC WITH DIFFERENTIAL/PLATELET
Abs Immature Granulocytes: 0.01 10*3/uL (ref 0.00–0.07)
Basophils Absolute: 0.1 10*3/uL (ref 0.0–0.1)
Basophils Relative: 1 %
Eosinophils Absolute: 0 10*3/uL (ref 0.0–0.5)
Eosinophils Relative: 0 %
HCT: 40.5 % (ref 36.0–46.0)
Hemoglobin: 13.3 g/dL (ref 12.0–15.0)
Immature Granulocytes: 0 %
Lymphocytes Relative: 38 %
Lymphs Abs: 1.5 10*3/uL (ref 0.7–4.0)
MCH: 29.4 pg (ref 26.0–34.0)
MCHC: 32.8 g/dL (ref 30.0–36.0)
MCV: 89.4 fL (ref 80.0–100.0)
Monocytes Absolute: 0.4 10*3/uL (ref 0.1–1.0)
Monocytes Relative: 11 %
Neutro Abs: 2 10*3/uL (ref 1.7–7.7)
Neutrophils Relative %: 50 %
Platelets: 164 10*3/uL (ref 150–400)
RBC: 4.53 MIL/uL (ref 3.87–5.11)
RDW: 15.2 % (ref 11.5–15.5)
WBC: 4 10*3/uL (ref 4.0–10.5)
nRBC: 0 % (ref 0.0–0.2)

## 2022-03-15 LAB — LACTATE DEHYDROGENASE: LDH: 170 U/L (ref 98–192)

## 2022-03-15 LAB — VITAMIN D 25 HYDROXY (VIT D DEFICIENCY, FRACTURES): Vit D, 25-Hydroxy: 56.52 ng/mL (ref 30–100)

## 2022-03-15 NOTE — Therapy (Signed)
OUTPATIENT PHYSICAL THERAPY TREATMENT  Patient Name: Lisa Klein MRN: [id number] DOB:[birth year], 88 y.o., female Today's Date: 03/15/2022   PT End of Session - 03/15/22 1038     Visit Number 13    Number of Visits 18    Date for PT Re-Evaluation 04/02/22    Authorization Type [employer] H[employer] (auth required, no visit limit based on med necessity)    Authorization Time Period 8 visits approved 7/31-8/28    Authorization - Visit Number 4    Authorization - Number of Visits 8    Progress Note Due on Visit 45    PT Start Time 1034    PT Stop Time 1115    PT Time Calculation (min) 41 min                 Past Medical History:  Diagnosis Date   Cancer ([place]) 2012   LEFT BREAST   Clotting disorder ([place]) 05/2011   dvt, right leg   Degenerative disc disease    with nerve compression    DJD (degenerative joint disease)    Hypertension    Hypertension    Hypothyroidism    Hypothyroidism    Long term current use of anticoagulant therapy 12/07/2014   Pulmonary embolus    Obesity    Osteopenia 08/24/2014   Past Surgical History:  Procedure Laterality Date   ABDOMINAL HYSTERECTOMY  1980   fibroids   BREAST SURGERY  2012   left total mastectomy   CHOLECYSTECTOMY  80'S   [facility]   CHOLECYSTECTOMY     HIP PINNING,CANNULATED Left 01/19/2013   Procedure: CANNULATED HIP PINNING-  left;  Surgeon: Marin Shutter, MD;  [facility];  Service: Orthopedics;  Laterality: Left;   MASTECTOMY MODIFIED RADICAL  02/14/11   left   PORT-A-CATH REMOVAL N/A 12/05/2012   Procedure: MINOR REMOVAL PORT-A-CATH;  Surgeon: Jamesetta So, MD;  Location: [facility];  Service: General;  Laterality: N/A;  In Minor Room   PORTACATH PLACEMENT  04/16/2011   Procedure: INSERTION PORT-A-CATH;  Surgeon: Jamesetta So;  Location: [facility];  Service: General;  Laterality: Right;  right subclavian   VESICOVAGINAL FISTULA CLOSURE W/ TAH     [facility]   Patient Active Problem List   Diagnosis Date Noted   Bilateral  shoulder pain 12/19/2021   Fatigue 03/27/2021   Shortness of breath 03/27/2021   GERD (gastroesophageal reflux disease) 03/27/2021   Knee pain, left 02/15/2020   Generalized osteoarthritis of multiple sites 04/02/2018   Allergic rhinitis 09/01/2015   Obesity 12/13/2014   Long term current use of anticoagulant therapy 12/07/2014   Osteopenia 08/24/2014   Infiltrating ductal carcinoma of breast ([place]) 04/10/2011   Hyperlipemia 11/07/2007   Hypothyroidism 08/15/2007   Essential hypertension 08/15/2007    PCP: [name] MD  REFERRING PROVIDER: [name] MD  REFERRING DIAG: M25.511,G89.29,M25.512 (ICD-10-CM) - Chronic pain of both shoulders M25.562,G89.29 (ICD-10-CM) - Chronic pain of left knee   THERAPY DIAG:  Chronic pain of left knee  Other abnormalities of gait and mobility  Muscle weakness (generalized)  Chronic right shoulder pain  Rationale for Evaluation and Treatment Rehabilitation  ONSET DATE: approximately 2013  SUBJECTIVE:   SUBJECTIVE STATEMENT: Pt reports she is still having the pain in her knees that is worse with weight bearing.  States the Lt knee is more sore than her Rt.  States the Shoulders continue to do well without pain.  PERTINENT HISTORY:  Right shoulder soreness and limited AROM, left  arm soreness but more ROM  Left knee pain with genu valgum  Severe arthritis There is severe tricompartmental degenerative joint disease of the left knee with marked bone on bone and osteophytes more laterally and knock-knee deformity present.  No fracture or loose body noted.  Bone quality is good.  Breast cancer in 2012  Knee pain, left Increased pain , stiffness and instability    Bilateral shoulder pain Pain , stiffness and reduced shoulder mobility right greater than left  History of left hip fx with ORIF       PAIN:  Are you having pain? Yes: NPRS scale: bilateral knees, Lt>>Rt standing 10/10 Pain location: right shoulder and left  knee Pain description: shoulder aching. Knee aching Aggravating factors: standing and getting up or using arm in adls Relieving factors: massaging area  PRECAUTIONS: None  WEIGHT BEARING RESTRICTIONS No  FALLS:  Has patient fallen in last 6 months? No and patient states she has had near falls but catches herself with hands on wall etc.    LIVING ENVIRONMENT: Lives with: lives alone Lives in: House/apartment Stairs:  stairs outside to basement and on back deck but hasn't done them in 15 years.  Also has attic in home but again has not done in many years.  Has following equipment at home: Quad cane small base, Environmental consultant - 2 wheeled, Environmental consultant - 4 wheeled, shower chair, and Grab bars  OCCUPATION: retired  PLOF: Independent with household mobility with device and Independent with homemaking with ambulation  PATIENT GOALS   -Primary improve strength of knee, try to decrease pain, more steady on feet.  -Secondary improve right shoulder strength, improve range of right shoulder, and decrease pain.    OBJECTIVE:   DIAGNOSTIC FINDINGS:  Impression:  Severe tricompartmental degenerative joint disease with marked bone on bone laterally  PATIENT SURVEYS:  FOTO 50  COGNITION:  Overall cognitive status: Within functional limits for tasks assessed     SENSATION: WFL  EDEMA:  In left knee; medial and proximal to patella    POSTURE: rounded shoulders, forward head, and increased thoracic kyphosis    LOWER EXTREMITY MMT:  MMT Right eval Left eval  Hip flexion 4 4  Hip extension 3 3  Hip abduction    Hip adduction    Hip internal rotation    Hip external rotation    Knee flexion    Knee extension 3 (pts knee gives in pain with cracking with minimal pressure) 3-  Ankle dorsiflexion 3 with minimal pressure knees start cracking) 3(with minimal pressure knees start cracking)  Ankle plantarflexion    Ankle inversion 3 3  Ankle eversion 3    (Blank rows = not tested)  LOWER  EXTREMITY ROM:  AROM Right eval Left eval  Knee flexion 100 80  Knee extension -5 -20    UPPER EXTREMITY ROM:  Active ROM Right eval Left eval Right 03/01/22  Shoulder flexion 50 AROM 120 degrees and then start of pain  127 AROM in sitting  Shoulder extension     Shoulder abduction 60 degrees AROM  130 PROM     (Blank rows = not tested)  UPPER EXTREMITY MMT:  MMT Right eval Left eval Right 03/01/22  Shoulder flexion 3-  4  Shoulder extension     Shoulder abduction 3-    Elbow extension/ flexion 3+  4+/4+  Wrist flexion 3+  4+   (Blank rows = not tested)   FUNCTIONAL TESTS:  5 times sit to stand: 80.46 seconds  with b/l UE support Timed up and go (TUG): to Left 60.79 seconds with NBQC  GAIT: Distance walked: 100' Assistive device utilized: Lobbyist Level of assistance: SBA and CGA Comments: increwased trunk flexion, decreased cadence, short step length, poor placement of cane on ground, decreased weightbearing on left LE  Sit to stand  B/L ue use at Close supervision, pt with large BOS and must come up in increments, does not get knees flexed enough   TODAY'S TREATMENT: 03/15/2022 Nustep seat 9, arms 7 x 5 min for mobility and endurance above 55 SPM level 3 Standing: at counter Heel raises x 20 Marching x 20 alternating Tandem stance x 10 sec each; CGA for safety  Hip Abduction 10X2 each in upright posturing  Hip Extension 10X2 each in upright posturing Supine:  Bridge with 3" holds, VC's to lower slowly 2X10  Heelslides 2X10  SLR 2X5 each Kinesiotaping of Lt knee to increase stability Attempted sit to stands with mat raised and unable     03/13/2022 Nustep seat 9, arms 7 x 5 min for mobility and endurance above 55 SPM level 3 Seated:Scapular retraction 2X10 RTB IR/ER 2X10 RTB UE press up with 2# DB 2X10 each Standing: at counter Heel raises x 20X Marching x 20 alternating Tandem stance x 10 sec each; CGA for safety  Hip Abduction 10X2  each in upright posturing  Hip Extension 10X2 each in upright posturing  03/07/2022 Nustep seat 10, arms 8 x 5 min for mobility and endurance above 55 SPM Seated:  Pulleys 2' flexion, 2' abduction  Scapular retraction 2X10 RTB  IR/ER 2X10 RTB  Shoulder extensions 2X10 RTB  UE press up with 2# DB 2X10 each Standing: // bars Heel raises x 20 Marching x 20 alternating Tandem stance x 10 sec each; CGA for safety   Hip Abduction 10X2 each in upright posturing   Hip Extension 10X2 each in upright posturing  03/05/2022 Nustep seat 10, arms 8 x 5 min for mobility and endurance above 55 SPM Seated:  Pulleys 2' flexion, 2' abduction  Scapular retraction 2X10 RTB  IR/ER 2X10 RTB  Shoulder extensions 2X10 RTB Standing: // bars Heel raises x 20 Marching x 20 alternating Tandem stance x 10 sec each; CGA for safety  Hip Abduction 10X each in upright posturing  Hip Extension 10X each in upright posturing   kinesiotape to Left knee for stability   03/01/22 FOTO 54 Progress note Pulley 2' flexion and 2' abduction TUG 51 sec today with QC 5 x sit to stand 55 sec today (goal met) Left knee kinesiotape for support  02/27/22 Pulley 2' flexion and 2' abduction  Scapular retraction 2 x 10 RTB Cervical retraction 2 x 10 IR/ER 2 x 10 RTB bilat (uses 2 bands/2 handles)  Nustep seat 10, arms 9 x 7 min for mobility and endurance  Standing: // bars Lumbar extension x 10 Heel raises x 10 Marching x 10 Tandem stance x 10 sec each; CGA for safety   02/21/22 Seated: Pulley 2' each flexion and abduction Scapular retraction 2 x 10 Cervical retraction 2 x 10 IR/ER 2 x 10 RTB bilat  Sit to stand x 5 1 min and 3 sec (63 sec)  Nustep seat 10, arms 9 x 7 min for mobility and endurance  Standing: Marching x 10 Heel raises x 10   02/19/22 Nustep level 1 x5 mins with b/l UE/LE kinesiotape to Left knee for stability Step up anterior x10 reps b/l onto 4  inch step Lateral step up 4 inch step  x10 reps Side steps in ll bars with yellow theraband resistance mid calf in //s 6 laps Walking march b/l UE support x6laps Pull to stand at // bars with 2 pillows and foam on top of chair 5 reps x2 sets, rest between  02/14/22 Pulley 2' each flexion and abduction Scapular retraction RTB 2 x 10 IR/ER 2 x 10 RTB  Trial of kinesiotape to Left knee for stability  Standing:  Heel toe raises x 10 Hip ext x 5 each March x 5 each Hip abduction x 5 each  02/12/22 UE pulleys into flexion and abd x2 mins Step up anterior x5 reps b/l onto 4 inch step Lateral step up 4 inch step x6 reps Attempted mini knee bends x5 Heel raises x15 Nustep level 1 x6 mins, half rom to begin into full rom Mini bridge x10 Supine marches x10 Mini crunch with UE tap to opposite knee x10 SLR x10    02/08/22 Seated:  LAQ 10X5" each LE AROM  Hip flexion 10X alternating bilaterally AROM  Sit to stand with less UE assist 5X Supine: Heelslides 10X each LE  Bridging with knees bent in available ROM  PVC pipe Rt UE flexion AAROM with Lt assist 5X  PVC pipe Rt UE Abduction AAROM with Lt assit 5X  Hip abduction 10X each  01/17/22 HEP established, goals reviewed Seated:  LAQ 10X5" each LE  Hip flexion 10X alternating bilaterally Supine: Heelslides 10X each LE  Bridging with knees bent in available ROM  PVC pipe Rt UE flexion AAROM with Lt assist 5X  PVC pipe Rt UE Abduction AAROM with Lt assit 5X  PATIENT EDUCATION:  Education details: 6/14: Goals reviewed, HEP initiated; evaluation: Patient educated on exam findings, POC, scope of PT. Person educated: Patient Education method: Explanation, Demonstration, and Handouts Education comprehension: verbalized understanding, returned demonstration, verbal cues required, and tactile cues required    HOME EXERCISE PROGRAM: Access Code: [email or web address] URL: [email or web address] Date: 02/14/2022 Prepared by: [facility]  Exercises - Standing Hip Extension  with Counter Support  - 2 x daily - 7 x weekly - 2 sets - 5 reps - Standing Hip Abduction with Counter Support  - 2 x daily - 7 x weekly - 2 sets - 5 reps - Standing March with Counter Support  - 2 x daily - 7 x weekly - 2 sets - 5 reps  Access Code: [email or web address] URL: [email or web address] Date: 01/17/2022 Prepared by: Roseanne Reno  Exercises - Seated Long Arc Quad  - 2 x daily - 7 x weekly - 2 sets - 10 reps - 5 seconds hold - Seated March  - 2 x daily - 7 x weekly - 2 sets - 10 reps - Supine Heel Slide  - 2 x daily - 7 x weekly - 2 sets - 10 reps - Bridge  - 2 x daily - 7 x weekly - 2 sets - 10 reps - Supine Shoulder Flexion with Dowel  - 2 x daily - 7 x weekly - 2 sets - 10 reps - Supine Shoulder Abduction AAROM with Dowel  - 2 x daily - 7 x weekly - 2 sets - 10 reps  ASSESSMENT:  CLINICAL IMPRESSION: Continued focus on UE and LE strengthening/stability.  Able to complete some standing exericises but continued on mat to reduce weight bearing.   Attempted standing from nearly full elevated mat and pt still unable to complete this without use of UE.  Gluteal mm very weak with inability to activate properly.  Focused on bridge with cues to hold and control descent to improve mm strength.  Therapist retaped Lt knee to help with stability. Pt continues to have minimal discomfort in shoulders.   Patient will benefit from continued skilled therapy interventions to address deficits and improve functional mobility.   OBJECTIVE IMPAIRMENTS Abnormal gait, decreased activity tolerance, decreased balance, decreased endurance, decreased knowledge of use of DME, decreased mobility, difficulty walking, decreased ROM, decreased strength, increased edema, increased fascial restrictions, impaired flexibility, impaired UE functional use, postural dysfunction, and pain.   ACTIVITY LIMITATIONS carrying, lifting, bending, standing, squatting, stairs, transfers, bed mobility, bathing, toileting,  dressing, reach over head, and locomotion level  PARTICIPATION LIMITATIONS: cleaning, laundry, shopping, community activity, and yard work  PERSONAL FACTORS Age and 3+ comorbidities:    Allergies, Arthritis, Back pain, BMI over 30, Cancer, Hearing Impairment, High Blood Pressure, Osteoporosis, Prior Surgeryare also affecting patient's functional outcome.   REHAB POTENTIAL: Fair chronicity of impairment, multi areas affected  CLINICAL DECISION MAKING: Stable/uncomplicated  EVALUATION COMPLEXITY: High   GOALS: Goals reviewed with patient? Yes  SHORT TERM GOALS: Target date: 02/09/2022  Patient will be independent with HEP in order to improve functional outcomes. Baseline:  Goal status: met  2.  Patient will report at least 25% improvement in symptoms for improved quality of life. Baseline:  Goal status: met  3.  Patient will improve five time sit to stand by 20 seconds to work towards improved efficiency in transfer and demonstrate improving LE strength. Baseline: 5 times sit to stand: 80.46 seconds with b/l UE support; 02/21/22 63 sec; 03/01/22 55 sec Goal status:goal met  4.  Pt will improve TUG by 10 seconds to demonstrate improving efficiency in ambulation Baseline: 03/01/22 51 sec Timed up and go (TUG): to Left 60.79 seconds with NBQC Goal status: IN PROGRESS  5.  Pt will improve right shoulder arom to 70 degrees to work towards improved functional use in adls Baseline: 50 degrees Goal status:met    LONG TERM GOALS: Target date: 03/09/2022  1.  Patient will improve FOTO score by at least 10 points in order to indicate improved tolerance to activity. Baseline: 50 Goal status: IN PROGRESS  2. Patient will improve five time sit to stand by 40 seconds to work towards improved efficiency in transfer and demonstrate improving LE strength. Baseline: 5 times sit to stand: 80.46 seconds with b/l UE support Goal status: IN PROGRESS  4.  Pt will improve TUG by 20 seconds to  demonstrate improving efficiency in ambulation Baseline:  Timed up and go (TUG): to Left 60.79 seconds with NBQC Goal status: IN PROGRESS  5.  Pt will improve right shoulder arom to 90 degrees to work towards improved functional use in adls Baseline: 50 degrees Goal status: IN PROGRESS    PLAN: PT FREQUENCY: 2x/week  PT DURATION: 8 weeks  PLANNED INTERVENTIONS: PLANNED INTERVENTIONS: Therapeutic exercises, Therapeutic activity, Neuromuscular re-education, Balance training, Gait training, Patient/Family education, Joint manipulation, Joint mobilization, Stair training, Orthotic/Fit training, DME instructions, Aquatic Therapy, Dry Needling, Electrical stimulation, Spinal manipulation, Spinal mobilization, Cryotherapy, Moist heat, Compression bandaging, scar mobilization, Splintting, Taping, Traction, Ultrasound, Ionotophoresis 79m/ml Dexamethasone, and Manual therapy   PLAN FOR NEXT SESSION: continue to build right shoulder strength and ROM as well as B/L LE strength and stretching.  Recommend continued therapy to address unmet and partially met goals.  Apply kinesiotape to Lt knee as needed for stability.    10:39 AM, 03/15/22  Teena Irani, PTA/CLT [place] Ph: [phone]

## 2022-03-20 ENCOUNTER — Ambulatory Visit (HOSPITAL_COMMUNITY): Payer: Medicare HMO | Admitting: Physical Therapy

## 2022-03-20 DIAGNOSIS — Z7982 Long term (current) use of aspirin: Secondary | ICD-10-CM | POA: Diagnosis not present

## 2022-03-20 DIAGNOSIS — E559 Vitamin D deficiency, unspecified: Secondary | ICD-10-CM | POA: Diagnosis not present

## 2022-03-20 DIAGNOSIS — G8929 Other chronic pain: Secondary | ICD-10-CM

## 2022-03-20 DIAGNOSIS — M6281 Muscle weakness (generalized): Secondary | ICD-10-CM

## 2022-03-20 DIAGNOSIS — Z86718 Personal history of other venous thrombosis and embolism: Secondary | ICD-10-CM | POA: Diagnosis not present

## 2022-03-20 DIAGNOSIS — Z170 Estrogen receptor positive status [ER+]: Secondary | ICD-10-CM | POA: Diagnosis not present

## 2022-03-20 DIAGNOSIS — R2689 Other abnormalities of gait and mobility: Secondary | ICD-10-CM

## 2022-03-20 DIAGNOSIS — Z7901 Long term (current) use of anticoagulants: Secondary | ICD-10-CM | POA: Diagnosis not present

## 2022-03-20 DIAGNOSIS — C50912 Malignant neoplasm of unspecified site of left female breast: Secondary | ICD-10-CM | POA: Diagnosis not present

## 2022-03-20 DIAGNOSIS — M8580 Other specified disorders of bone density and structure, unspecified site: Secondary | ICD-10-CM | POA: Diagnosis not present

## 2022-03-20 DIAGNOSIS — Z86711 Personal history of pulmonary embolism: Secondary | ICD-10-CM | POA: Diagnosis not present

## 2022-03-20 DIAGNOSIS — Z9012 Acquired absence of left breast and nipple: Secondary | ICD-10-CM | POA: Diagnosis not present

## 2022-03-20 NOTE — Therapy (Signed)
OUTPATIENT PHYSICAL THERAPY TREATMENT  Patient Name: Lisa Klein MRN: [id number] DOB:1929-10-02, 88 y.o., female Today's Date: 03/20/2022   PT End of Session - 03/20/22 1030     Visit Number 14    Number of Visits 18    Date for PT Re-Evaluation 04/02/22    Authorization Type [employer] H[employer] (auth required, no visit limit based on med necessity)    Authorization Time Period 8 visits approved 7/31-8/28    Authorization - Visit Number 5    Authorization - Number of Visits 8    Progress Note Due on Visit 55    PT Start Time 1030    PT Stop Time 1112    PT Time Calculation (min) 42 min    Activity Tolerance Patient tolerated treatment well    Behavior During Therapy [facility] for tasks assessed/performed                 Past Medical History:  Diagnosis Date   Cancer ([place]) 2012   LEFT BREAST   Clotting disorder ([place]) 05/2011   dvt, right leg   Degenerative disc disease    with nerve compression    DJD (degenerative joint disease)    Hypertension    Hypertension    Hypothyroidism    Hypothyroidism    Long term current use of anticoagulant therapy 12/07/2014   Pulmonary embolus    Obesity    Osteopenia 08/24/2014   Past Surgical History:  Procedure Laterality Date   ABDOMINAL HYSTERECTOMY  1980   fibroids   BREAST SURGERY  2012   left total mastectomy   CHOLECYSTECTOMY  80'S   [facility]   CHOLECYSTECTOMY     HIP PINNING,CANNULATED Left 01/19/2013   Procedure: CANNULATED HIP PINNING-  left;  Surgeon: Marin Shutter, MD;  Location: [facility];  Service: Orthopedics;  Laterality: Left;   MASTECTOMY MODIFIED RADICAL  02/14/11   left   PORT-A-CATH REMOVAL N/A 12/05/2012   Procedure: MINOR REMOVAL PORT-A-CATH;  Surgeon: Jamesetta So, MD;  Location: [facility];  Service: General;  Laterality: N/A;  In Minor Room   PORTACATH PLACEMENT  04/16/2011   Procedure: INSERTION PORT-A-CATH;  Surgeon: Jamesetta So;  Location: [facility];  Service: General;  Laterality: Right;  right subclavian    VESICOVAGINAL FISTULA CLOSURE W/ TAH     [facility]   Patient Active Problem List   Diagnosis Date Noted   Bilateral shoulder pain 12/19/2021   Fatigue 03/27/2021   Shortness of breath 03/27/2021   GERD (gastroesophageal reflux disease) 03/27/2021   Knee pain, left 02/15/2020   Generalized osteoarthritis of multiple sites 04/02/2018   Allergic rhinitis 09/01/2015   Obesity 12/13/2014   Long term current use of anticoagulant therapy 12/07/2014   Osteopenia 08/24/2014   Infiltrating ductal carcinoma of breast ([place]) 04/10/2011   Hyperlipemia 11/07/2007   Hypothyroidism 08/15/2007   Essential hypertension 08/15/2007    PCP: [name] MD  REFERRING PROVIDER: [name] MD  REFERRING DIAG: M25.511,G89.29,M25.512 (ICD-10-CM) - Chronic pain of both shoulders M25.562,G89.29 (ICD-10-CM) - Chronic pain of left knee   THERAPY DIAG:  Chronic pain of left knee  Other abnormalities of gait and mobility  Muscle weakness (generalized)  Rationale for Evaluation and Treatment Rehabilitation  ONSET DATE: approximately 2013  SUBJECTIVE:   SUBJECTIVE STATEMENT: Patient reports slow improvement in knees and arm. She reports varied compliance with HEP. "I try".   PERTINENT HISTORY:  Right shoulder soreness and limited AROM, left arm soreness but more ROM  Left  knee pain with genu valgum  Severe arthritis There is severe tricompartmental degenerative joint disease of the left knee with marked bone on bone and osteophytes more laterally and knock-knee deformity present.  No fracture or loose body noted.  Bone quality is good.  Breast cancer in 2012  Knee pain, left Increased pain , stiffness and instability    Bilateral shoulder pain Pain , stiffness and reduced shoulder mobility right greater than left  History of left hip fx with ORIF       PAIN:  Are you having pain? Yes: NPRS scale:  2/10 Pain location: right shoulder and left knee Pain description: shoulder  aching. Knee aching Aggravating factors: standing and getting up or using arm in adls Relieving factors: massaging area  PRECAUTIONS: None  WEIGHT BEARING RESTRICTIONS No  FALLS:  Has patient fallen in last 6 months? No and patient states she has had near falls but catches herself with hands on wall etc.    LIVING ENVIRONMENT: Lives with: lives alone Lives in: House/apartment Stairs:  stairs outside to basement and on back deck but hasn't done them in 15 years.  Also has attic in home but again has not done in many years.  Has following equipment at home: Quad cane small base, Environmental consultant - 2 wheeled, Environmental consultant - 4 wheeled, shower chair, and Grab bars  OCCUPATION: retired  PLOF: Independent with household mobility with device and Independent with homemaking with ambulation  PATIENT GOALS   -Primary improve strength of knee, try to decrease pain, more steady on feet.  -Secondary improve right shoulder strength, improve range of right shoulder, and decrease pain.    OBJECTIVE:   DIAGNOSTIC FINDINGS:  Impression:  Severe tricompartmental degenerative joint disease with marked bone on bone laterally  PATIENT SURVEYS:  FOTO 50  COGNITION:  Overall cognitive status: Within functional limits for tasks assessed     SENSATION: WFL  EDEMA:  In left knee; medial and proximal to patella    POSTURE: rounded shoulders, forward head, and increased thoracic kyphosis    LOWER EXTREMITY MMT:  MMT Right eval Left eval  Hip flexion 4 4  Hip extension 3 3  Hip abduction    Hip adduction    Hip internal rotation    Hip external rotation    Knee flexion    Knee extension 3 (pts knee gives in pain with cracking with minimal pressure) 3-  Ankle dorsiflexion 3 with minimal pressure knees start cracking) 3(with minimal pressure knees start cracking)  Ankle plantarflexion    Ankle inversion 3 3  Ankle eversion 3    (Blank rows = not tested)  LOWER EXTREMITY ROM:  AROM Right eval  Left eval  Knee flexion 100 80  Knee extension -5 -20    UPPER EXTREMITY ROM:  Active ROM Right eval Left eval Right 03/01/22  Shoulder flexion 50 AROM 120 degrees and then start of pain  127 AROM in sitting  Shoulder extension     Shoulder abduction 60 degrees AROM  130 PROM     (Blank rows = not tested)  UPPER EXTREMITY MMT:  MMT Right eval Left eval Right 03/01/22  Shoulder flexion 3-  4  Shoulder extension     Shoulder abduction 3-    Elbow extension/ flexion 3+  4+/4+  Wrist flexion 3+  4+   (Blank rows = not tested)   FUNCTIONAL TESTS:  5 times sit to stand: 80.46 seconds with b/l UE support Timed up and go (TUG): to  Left 60.79 seconds with NBQC  GAIT: Distance walked: 100' Assistive device utilized: Lobbyist Level of assistance: SBA and CGA Comments: increwased trunk flexion, decreased cadence, short step length, poor placement of cane on ground, decreased weightbearing on left LE  Sit to stand  B/L ue use at Close supervision, pt with large BOS and must come up in increments, does not get knees flexed enough   TODAY'S TREATMENT: 03/20/22 Nustep seat 9, arms 7 x 6 min for mobility and endurance level 3 Standing:  Heel raises x 20 Marching x 20 alternating   Hip Abduction 10X2 each in upright posturing  Hip Extension 10X2 each in upright posturing   Standing knee flexion 2 x 10   Step up 4 inch x 10 each HHA x 2    Band rows GTB 2 x 10  Shoulder extension GTB 2 x 10  Shoulder ER GTB 2 x 10 each  Seated  Pulleys flexion 2 min   03/15/2022 Nustep seat 9, arms 7 x 5 min for mobility and endurance above 55 SPM level 3 Standing: at counter Heel raises x 20 Marching x 20 alternating Tandem stance x 10 sec each; CGA for safety  Hip Abduction 10X2 each in upright posturing  Hip Extension 10X2 each in upright posturing Supine:  Bridge with 3" holds, VC's to lower slowly 2X10  Heelslides 2X10  SLR 2X5 each Kinesiotaping of Lt knee to  increase stability Attempted sit to stands with mat raised and unable    03/13/2022 Nustep seat 9, arms 7 x 5 min for mobility and endurance above 55 SPM level 3 Seated:Scapular retraction 2X10 RTB IR/ER 2X10 RTB UE press up with 2# DB 2X10 each Standing: at counter Heel raises x 20X Marching x 20 alternating Tandem stance x 10 sec each; CGA for safety  Hip Abduction 10X2 each in upright posturing  Hip Extension 10X2 each in upright posturing  03/07/2022 Nustep seat 10, arms 8 x 5 min for mobility and endurance above 55 SPM Seated:  Pulleys 2' flexion, 2' abduction  Scapular retraction 2X10 RTB  IR/ER 2X10 RTB  Shoulder extensions 2X10 RTB  UE press up with 2# DB 2X10 each Standing: // bars Heel raises x 20 Marching x 20 alternating Tandem stance x 10 sec each; CGA for safety   Hip Abduction 10X2 each in upright posturing   Hip Extension 10X2 each in upright posturing  03/05/2022 Nustep seat 10, arms 8 x 5 min for mobility and endurance above 55 SPM Seated:  Pulleys 2' flexion, 2' abduction  Scapular retraction 2X10 RTB  IR/ER 2X10 RTB  Shoulder extensions 2X10 RTB Standing: // bars Heel raises x 20 Marching x 20 alternating Tandem stance x 10 sec each; CGA for safety  Hip Abduction 10X each in upright posturing  Hip Extension 10X each in upright posturing   kinesiotape to Left knee for stability   03/01/22 FOTO 54 Progress note Pulley 2' flexion and 2' abduction TUG 51 sec today with QC 5 x sit to stand 55 sec today (goal met) Left knee kinesiotape for support  02/27/22 Pulley 2' flexion and 2' abduction  Scapular retraction 2 x 10 RTB Cervical retraction 2 x 10 IR/ER 2 x 10 RTB bilat (uses 2 bands/2 handles)  Nustep seat 10, arms 9 x 7 min for mobility and endurance  Standing: // bars Lumbar extension x 10 Heel raises x 10 Marching x 10 Tandem stance x 10 sec each; CGA for safety  02/21/22 Seated: Pulley 2' each flexion and abduction Scapular  retraction 2 x 10 Cervical retraction 2 x 10 IR/ER 2 x 10 RTB bilat  Sit to stand x 5 1 min and 3 sec (63 sec)  Nustep seat 10, arms 9 x 7 min for mobility and endurance  Standing: Marching x 10 Heel raises x 10   02/19/22 Nustep level 1 x5 mins with b/l UE/LE kinesiotape to Left knee for stability Step up anterior x10 reps b/l onto 4 inch step Lateral step up 4 inch step x10 reps Side steps in ll bars with yellow theraband resistance mid calf in //s 6 laps Walking march b/l UE support x6laps Pull to stand at // bars with 2 pillows and foam on top of chair 5 reps x2 sets, rest between  02/14/22 Pulley 2' each flexion and abduction Scapular retraction RTB 2 x 10 IR/ER 2 x 10 RTB  Trial of kinesiotape to Left knee for stability  Standing:  Heel toe raises x 10 Hip ext x 5 each March x 5 each Hip abduction x 5 each  02/12/22 UE pulleys into flexion and abd x2 mins Step up anterior x5 reps b/l onto 4 inch step Lateral step up 4 inch step x6 reps Attempted mini knee bends x5 Heel raises x15 Nustep level 1 x6 mins, half rom to begin into full rom Mini bridge x10 Supine marches x10 Mini crunch with UE tap to opposite knee x10 SLR x10    02/08/22 Seated:  LAQ 10X5" each LE AROM  Hip flexion 10X alternating bilaterally AROM  Sit to stand with less UE assist 5X Supine: Heelslides 10X each LE  Bridging with knees bent in available ROM  PVC pipe Rt UE flexion AAROM with Lt assist 5X  PVC pipe Rt UE Abduction AAROM with Lt assit 5X  Hip abduction 10X each  01/17/22 HEP established, goals reviewed Seated:  LAQ 10X5" each LE  Hip flexion 10X alternating bilaterally Supine: Heelslides 10X each LE  Bridging with knees bent in available ROM  PVC pipe Rt UE flexion AAROM with Lt assist 5X  PVC pipe Rt UE Abduction AAROM with Lt assit 5X  PATIENT EDUCATION:  Education details: 6/14: Goals reviewed, HEP initiated; evaluation: Patient educated on exam findings, POC, scope of  PT. Person educated: Patient Education method: Explanation, Demonstration, and Handouts Education comprehension: verbalized understanding, returned demonstration, verbal cues required, and tactile cues required    HOME EXERCISE PROGRAM: Access Code: [email or web address] URL: [email or web address] Date: 02/14/2022 Prepared by: [facility]  Exercises - Standing Hip Extension with Counter Support  - 2 x daily - 7 x weekly - 2 sets - 5 reps - Standing Hip Abduction with Counter Support  - 2 x daily - 7 x weekly - 2 sets - 5 reps - Standing March with Counter Support  - 2 x daily - 7 x weekly - 2 sets - 5 reps  Access Code: [email or web address] URL: [email or web address] Date: 01/17/2022 Prepared by: Roseanne Reno  Exercises - Seated Long Arc Quad  - 2 x daily - 7 x weekly - 2 sets - 10 reps - 5 seconds hold - Seated March  - 2 x daily - 7 x weekly - 2 sets - 10 reps - Supine Heel Slide  - 2 x daily - 7 x weekly - 2 sets - 10 reps - Bridge  - 2 x daily - 7 x weekly - 2 sets - 10 reps - Supine Shoulder Flexion with Dowel  -  2 x daily - 7 x weekly - 2 sets - 10 reps - Supine Shoulder Abduction AAROM with Dowel  - 2 x daily - 7 x weekly - 2 sets - 10 reps  ASSESSMENT:  CLINICAL IMPRESSION: Continued  with established POC for LE and shoulder strengthening. Patient showed good return for standing LE strengthening, but did require verbal cues for upright posturing throughout. Patient showed good tolerance with band resisted shoulder strengthening, increased resistance to green. Patient required verbal cues for proper arm position and mechanics with shoulder ER. Patient will continue to benefit from skilled therapy services to reduce remaining deficits and improve functional ability.     OBJECTIVE IMPAIRMENTS Abnormal gait, decreased activity tolerance, decreased balance, decreased endurance, decreased knowledge of use of DME, decreased mobility, difficulty walking, decreased ROM, decreased  strength, increased edema, increased fascial restrictions, impaired flexibility, impaired UE functional use, postural dysfunction, and pain.   ACTIVITY LIMITATIONS carrying, lifting, bending, standing, squatting, stairs, transfers, bed mobility, bathing, toileting, dressing, reach over head, and locomotion level  PARTICIPATION LIMITATIONS: cleaning, laundry, shopping, community activity, and yard work  PERSONAL FACTORS Age and 3+ comorbidities:    Allergies, Arthritis, Back pain, BMI over 30, Cancer, Hearing Impairment, High Blood Pressure, Osteoporosis, Prior Surgeryare also affecting patient's functional outcome.   REHAB POTENTIAL: Fair chronicity of impairment, multi areas affected  CLINICAL DECISION MAKING: Stable/uncomplicated  EVALUATION COMPLEXITY: High   GOALS: Goals reviewed with patient? Yes  SHORT TERM GOALS: Target date: 02/09/2022  Patient will be independent with HEP in order to improve functional outcomes. Baseline:  Goal status: met  2.  Patient will report at least 25% improvement in symptoms for improved quality of life. Baseline:  Goal status: met  3.  Patient will improve five time sit to stand by 20 seconds to work towards improved efficiency in transfer and demonstrate improving LE strength. Baseline: 5 times sit to stand: 80.46 seconds with b/l UE support; 02/21/22 63 sec; 03/01/22 55 sec Goal status:goal met  4.  Pt will improve TUG by 10 seconds to demonstrate improving efficiency in ambulation Baseline: 03/01/22 51 sec Timed up and go (TUG): to Left 60.79 seconds with NBQC Goal status: IN PROGRESS  5.  Pt will improve right shoulder arom to 70 degrees to work towards improved functional use in adls Baseline: 50 degrees Goal status:met    LONG TERM GOALS: Target date: 03/09/2022  1.  Patient will improve FOTO score by at least 10 points in order to indicate improved tolerance to activity. Baseline: 50 Goal status: IN PROGRESS  2. Patient will  improve five time sit to stand by 40 seconds to work towards improved efficiency in transfer and demonstrate improving LE strength. Baseline: 5 times sit to stand: 80.46 seconds with b/l UE support Goal status: IN PROGRESS  4.  Pt will improve TUG by 20 seconds to demonstrate improving efficiency in ambulation Baseline:  Timed up and go (TUG): to Left 60.79 seconds with NBQC Goal status: IN PROGRESS  5.  Pt will improve right shoulder arom to 90 degrees to work towards improved functional use in adls Baseline: 50 degrees Goal status: IN PROGRESS    PLAN: PT FREQUENCY: 2x/week  PT DURATION: 8 weeks  PLANNED INTERVENTIONS: PLANNED INTERVENTIONS: Therapeutic exercises, Therapeutic activity, Neuromuscular re-education, Balance training, Gait training, Patient/Family education, Joint manipulation, Joint mobilization, Stair training, Orthotic/Fit training, DME instructions, Aquatic Therapy, Dry Needling, Electrical stimulation, Spinal manipulation, Spinal mobilization, Cryotherapy, Moist heat, Compression bandaging, scar mobilization, Splintting, Taping, Traction, Ultrasound, Ionotophoresis  20m/ml Dexamethasone, and Manual therapy   PLAN FOR NEXT SESSION: continue to build right shoulder strength and ROM as well as B/L LE strength and stretching.  Recommend continued therapy to address unmet and partially met goals.    11:13 AM, 03/20/22 CJosue HectorPT DPT  [facility] ([phone]

## 2022-03-21 NOTE — Progress Notes (Unsigned)
Lisa Klein, [place] [id number]   CLINIC:  Medical Oncology/Hematology  PCP:  Lisa Helper, MD [address], [place] / [place] [address] [phone]   REASON FOR VISIT:  Follow-up for stage IIIa (T1 cN2 aM0) adenocarcinoma of the left breast s/p modified radical mastectomy  PRIOR THERAPY: - Modified radical mastectomy (02/14/2011) - Dose reduced epirubicin and Cytoxan x6 cycles - Radiation therapy  CURRENT THERAPY: Arimidex x10 years (April 2013 - April 2023)  BRIEF ONCOLOGIC HISTORY:  Oncology History   No history exists.    Klein STAGING:  Klein Staging  Infiltrating ductal carcinoma of breast ([place]) Staging form: Breast, AJCC 7th Edition - Clinical: Stage IIIA (T1c, N2a, cM0) - Signed by Lisa Cancer, PA on 04/18/2011   INTERVAL HISTORY:  Lisa Klein, a 88 y.o. female, returns for routine follow-up of her left-sided breast Klein. Christmas was last seen by Lisa Abernethy PA-C on 09/21/2021.  At today's visit, she  reports feeling fairly well, with good days and bad days. ***   She denies any recent hospitalizations, surgeries, or changes in her  baseline health status.  Most recent right breast mammogram on 08/18/2021 showed no mammographic evidence of malignancy.  She denies any symptoms of recurrence such as new lumps, chest pain, dyspnea, or abdominal pain.  ***  She has no new headaches, seizures, or focal neurologic deficits.    *** No B symptoms such as fever, chills, night sweats, unintentional weight loss.  *** She has chronic arthritic pain, but denies any new bone pains.  She stopped taking her Arimidex as of April 2023.  ***  She is tolerating Prolia well and denies any jaw pain.  ***  She has not noted any new bone pain or fractures.   ***   She is taking Xarelto as prescribed and tolerating well without any major bleeding events, but does not some occasional gum bleeding.   ***  She  denies any current symptoms of recurrent DVT or PE. ***   She reports  *** % energy and  *** % appetite.  She is maintaining stable weight at this time.   REVIEW OF SYSTEMS:  ***  Review of Systems  Constitutional:  Positive for fatigue (chronic, at baseline). Negative for appetite change, chills, diaphoresis, fever and unexpected weight change.  HENT:   Negative for lump/mass and nosebleeds.   Eyes:  Negative for eye problems.  Respiratory:  Negative for cough, hemoptysis and shortness of breath.   Cardiovascular:  Negative for chest pain, leg swelling and palpitations.  Gastrointestinal:  Negative for abdominal pain, blood in stool, constipation, diarrhea, nausea and vomiting.  Genitourinary:  Positive for dysuria. Negative for hematuria.        Positive for urgency  Musculoskeletal:  Positive for arthralgias.  Skin: Negative.   Neurological:  Negative for dizziness, headaches and light-headedness.  Hematological:  Does not bruise/bleed easily.  Psychiatric/Behavioral:  Positive for sleep disturbance.      PAST MEDICAL/SURGICAL HISTORY:  Past Medical History:  Diagnosis Date   Klein ([place]) 2012   LEFT BREAST   Clotting disorder ([place]) 05/2011   dvt, right leg   Degenerative disc disease    with nerve compression    DJD (degenerative joint disease)    Hypertension    Hypertension    Hypothyroidism    Hypothyroidism    Long term current use of anticoagulant therapy 12/07/2014   Pulmonary embolus  Obesity    Osteopenia 08/24/2014   Past Surgical History:  Procedure Laterality Date   ABDOMINAL HYSTERECTOMY  1980   fibroids   BREAST SURGERY  2012   left total mastectomy   CHOLECYSTECTOMY  80'S   [facility]   CHOLECYSTECTOMY     HIP PINNING,CANNULATED Left 01/19/2013   Procedure: CANNULATED HIP PINNING-  left;  Surgeon: Marin Shutter, MD;  Location: [facility];  Service: Orthopedics;  Laterality: Left;   MASTECTOMY MODIFIED RADICAL  02/14/11   left   PORT-A-CATH REMOVAL N/A  12/05/2012   Procedure: MINOR REMOVAL PORT-A-CATH;  Surgeon: Jamesetta So, MD;  Location: [facility];  Service: General;  Laterality: N/A;  In Minor Room   PORTACATH PLACEMENT  04/16/2011   Procedure: INSERTION PORT-A-CATH;  Surgeon: Jamesetta So;  Location: [facility];  Service: General;  Laterality: Right;  right subclavian   VESICOVAGINAL FISTULA CLOSURE W/ TAH     [facility]    SOCIAL HISTORY:  Social History   Socioeconomic History   Marital status: Widowed    Spouse name: Not on file   Number of children: 2   Years of education: 39   Highest education level: 12th grade  Occupational History   Occupation: [employer] part time    Occupation: retired/disabled   Tobacco Use   Smoking status: Never   Smokeless tobacco: Never  Substance and Sexual Activity   Alcohol use: No   Drug use: No   Sexual activity: Not Currently    Birth control/protection: Post-menopausal  Other Topics Concern   Not on file  Social History Narrative   Reports starting to have a lot of trouble doing things for herself. Lives alone, tries to drive to places, but it is getting increasingly harder to do.    Social Determinants of Health   Financial Resource Strain: Low Risk  (07/05/2021)   Overall Financial Resource Strain (CARDIA)    Difficulty of Paying Living Expenses: Not very hard  Food Insecurity: No Food Insecurity (07/05/2021)   Hunger Vital Sign    Worried About Running Out of Food in the Last Year: Never true    Ran Out of Food in the Last Year: Never true  Transportation Needs: No Transportation Needs (07/05/2021)   PRAPARE - Hydrologist (Medical): No    Lack of Transportation (Non-Medical): No  Physical Activity: Insufficiently Active (06/16/2020)   Exercise Vital Sign    Days of Exercise per Week: 5 days    Minutes of Exercise per Session: 10 min  Stress: No Stress Concern Present (07/05/2021)   [place]     Feeling of Stress : Not at all  Social Connections: Moderately Isolated (07/05/2021)   Social Connection and Isolation Panel [NHANES]    Frequency of Communication with Friends and Family: Twice a week    Frequency of Social Gatherings with Friends and Family: Twice a week    Attends Religious Services: More than 4 times per year    Active Member of [employer] or Organizations: No    Attends Archivist Meetings: Never    Marital Status: Widowed  Intimate Partner Violence: Not At Risk (06/16/2020)   Humiliation, Afraid, Rape, and Kick questionnaire    Fear of Current or Ex-Partner: No    Emotionally Abused: No    Physically Abused: No    Sexually Abused: No    FAMILY HISTORY:  Family History  Problem Relation Age of Onset  COPD Father    Lung disease Father    Hypertension Sister    Arthritis Mother    Hypertension Sister    Heart disease Son        stent   Anesthesia problems Neg Hx    Malignant hyperthermia Neg Hx    Hypotension Neg Hx    Pseudochol deficiency Neg Hx     CURRENT MEDICATIONS:  Current Outpatient Medications  Medication Sig Dispense Refill   acetaminophen (TYLENOL) 650 MG CR tablet Take 650 mg by mouth 2 (two) times daily.     Biotin w/ Vitamins C & E (HAIR/SKIN/NAILS PO) Take by mouth.     calcium-vitamin D (OSCAL WITH D) 500-200 MG-UNIT tablet Take 1 tablet by mouth 2 (two) times daily.     clotrimazole-betamethasone (LOTRISONE) cream Apply 1 application. topically 2 (two) times daily. 45 g 1   Cod Liver Oil 1000 MG CAPS Take 1 capsule by mouth daily.      denosumab (PROLIA) 60 MG/ML SOLN injection Inject 60 mg into the skin every 6 (six) months. Administer in upper arm, thigh, or abdomen     docusate sodium (COLACE) 100 MG capsule Take 100 mg by mouth daily as needed for constipation. For constipation     hydrochlorothiazide (MICROZIDE) 12.5 MG capsule TAKE 1 CAPSULE EVERY MORNING 90 capsule 2   latanoprost (XALATAN) 0.005 % ophthalmic solution  SMARTSIG:In Eye(s)     levothyroxine (SYNTHROID) 100 MCG tablet TAKE 1 TABLET EVERY MORNING 90 tablet 0   lovastatin (MEVACOR) 20 MG tablet TAKE 1 TABLET (20 MG TOTAL) BY MOUTH AT BEDTIME. 90 tablet 3   pantoprazole (PROTONIX) 20 MG tablet TAKE 1 TABLET EVERY DAY 90 tablet 1   potassium chloride (KLOR-CON M) 10 MEQ tablet TAKE 1 TABLET TWICE DAILY 180 tablet 1   vitamin E 1000 UNIT capsule Take 1,000 Units by mouth daily.     XARELTO 20 MG TABS tablet TAKE 1 TABLET EVERY DAY WITH SUPPER 90 tablet 6   No current facility-administered medications for this visit.   Facility-Administered Medications Ordered in Other Visits  Medication Dose Route Frequency Provider Last Rate Last Admin   heparin lock flush 100 unit/mL  500 Units Intravenous Once Kefalas, Thomas S, PA-C       heparin lock flush 100 unit/mL  500 Units Intravenous Once Everardo All, MD       sodium chloride 0.9 % injection 10 mL  10 mL Intravenous PRN Kefalas, Thomas S, PA-C       sodium chloride 0.9 % injection 10 mL  10 mL Intravenous PRN Everardo All, MD        ALLERGIES:  Allergies  Allergen Reactions   Listerine [Antiseptic Mouth Rinse]     Caused sore throat   Singulair [Montelukast Sodium] Other (See Comments)    Abdominal spasm     PHYSICAL EXAM:  ***  Performance status (ECOG): 2 - Symptomatic, <50% confined to bed  There were no vitals filed for this visit. Wt Readings from Last 3 Encounters:  12/19/21 182 lb (82.6 kg)  09/21/21 180 lb 12.4 oz (82 kg)  08/10/21 179 lb (81.2 kg)   Physical Exam Constitutional:      Appearance: Normal appearance. She is obese.  HENT:     Head: Normocephalic and atraumatic.     Mouth/Throat:     Mouth: Mucous membranes are moist.  Eyes:     Extraocular Movements: Extraocular movements intact.     Pupils: Pupils are equal, round,  and reactive to light.  Cardiovascular:     Rate and Rhythm: Normal rate and regular rhythm.     Pulses: Normal pulses.     Heart  sounds: Normal heart sounds.  Pulmonary:     Effort: Pulmonary effort is normal.     Breath sounds: Normal breath sounds.  Chest:  Breasts:    Right: Normal. No swelling, bleeding, inverted nipple, mass, nipple discharge, skin change or tenderness.     Left: Absent.    Abdominal:     General: Bowel sounds are normal.     Palpations: Abdomen is soft.     Tenderness: There is no abdominal tenderness.  Musculoskeletal:        General: No swelling.     Right lower leg: Edema (trace) present.     Left lower leg: Edema (trace) present.  Lymphadenopathy:     Cervical: No cervical adenopathy.     Upper Body:     Right upper body: No supraclavicular, axillary or pectoral adenopathy.     Left upper body: No supraclavicular, axillary or pectoral adenopathy.  Skin:    General: Skin is warm and dry.  Neurological:     General: No focal deficit present.     Mental Status: She is alert and oriented to person, place, and time.  Psychiatric:        Mood and Affect: Mood normal.        Behavior: Behavior normal.      LABORATORY DATA:  I have reviewed the labs as listed.     Latest Ref Rng & Units 03/15/2022    1:04 PM 09/14/2021   12:08 PM 03/20/2021   12:16 PM  CBC  WBC 4.0 - 10.5 K/uL 4.0  3.9  3.9   Hemoglobin 12.0 - 15.0 g/dL 13.3  12.2  12.8   Hematocrit 36.0 - 46.0 % 40.5  39.2  40.0   Platelets 150 - 400 K/uL 164  160  169       Latest Ref Rng & Units 03/15/2022    1:04 PM 09/14/2021   12:08 PM 07/26/2021   11:06 AM  CMP  Glucose 70 - 99 mg/dL 116  104  86   BUN 8 - 23 mg/dL _0 Creatinine 0.44 - 1.00 mg/dL 0.90  1.06  0.81   Sodium 135 - 145 mmol/L 138  139  142   Potassium 3.5 - 5.1 mmol/L 3.7  3.9  4.4   Chloride 98 - 111 mmol/L 106  107  102   CO2 22 - 32 mmol/L _1 Calcium 8.9 - 10.3 mg/dL 9.4  9.3  9.7   Total Protein 6.5 - 8.1 g/dL 7.2  6.7  7.0   Total Bilirubin 0.3 - 1.2 mg/dL 0.9  0.6  0.7   Alkaline Phos 38 - 126 U/L 32  35  39   AST 15 - 41  U/L _2 ALT 0 - 44 U/L _3 DIAGNOSTIC IMAGING:  I have independently reviewed the scans and discussed with the patient. No results found.   ASSESSMENT & PLAN: 1.  Left-sided breast Klein (stage IIIa T1 cN2 aM0 adenocarcinoma of the left breast) -Patient had grade 3 Klein with multifocal invasive disease.  Largest focus was 1.5 cm.  She did have involving the deep surgical margin of resection.  She had 6 of 7 positive  nodes involved and axilla.  ER +100%, PR +14%, Ki-67 45%, HER2/neu nonamplified. - Modified radical mastectomy on 02/14/2011 - Received dose reduced epirubicin and Cytoxan x6 cycles - Completed radiation therapy - Last PET scan was NED in April 2014 - She completed 10 years of Arimidex (April 2013 - April 2023) ***  - Most recent right breast mammogram (08/18/2021): BI-RADS Category 1, negative; no mammographic evidence of malignancy - Physical exam was negative for any discrete nodules or masses ***  - She is clinically asymptomatic  ***  - Most recent labs (03/15/2022) show CMP at baseline, unremarkable CBC.  Normal LDH. - PLAN: We will continue follow-up visits every 6 months per patient preference. - Repeat labs, office visit, and physical exam in 6 months. - Next mammogram due in January 2024 ***   2.  Osteopenia - Osteopenia in the setting of antiestrogen therapy - She was started on Prolia on 09/01/2014, and she has been tolerating it well  - She is taking calcium and vitamin D twice daily  - Vitamin D within target range. - Most recent DEXA scan (08/15/2020): T score -1.3 - No jaw pain.  Most recent calcium 9.3 (09/14/2021). - PLAN: Proceed with Prolia today. - We will discontinue Prolia after today since she has completed treatment of aromatase inhibitor. - In 6 months, we will start her on alendronate x1 year due to risk of rebound bone loss after discontinuation of Prolia. ***OR*** give single dose of zoledronic acid 6 months after last  denosumab??? - Next DEXA scan due January 2024.  3.  History of DVT and PE - Patient has had VTE x2 - Right lower extremity DVT in 2012 - Bilateral PE in 2014 - On lifelong anticoagulation - initially treated with Coumadin, but switched to Xarelto - She is tolerating Xarelto well without any bleeding events ***  - She denies any current symptoms of recurrent DVT or PE - PLAN: Continue Xarelto indefinitely.  4.  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   PLAN SUMMARY & DISPOSITION: ***   ***   All questions were answered. The patient knows to call the clinic with any problems, questions or concerns.  Medical decision making: Moderate ***   Time spent on visit: I spent 20 minutes counseling the patient face to face. The total time spent in the appointment was 30 minutes and more than 50% was on counseling.   Harriett Rush, PA-C   ***

## 2022-03-22 ENCOUNTER — Ambulatory Visit (HOSPITAL_COMMUNITY): Payer: Medicare HMO | Admitting: Physical Therapy

## 2022-03-22 ENCOUNTER — Encounter (HOSPITAL_COMMUNITY): Payer: Self-pay | Admitting: Physical Therapy

## 2022-03-22 ENCOUNTER — Inpatient Hospital Stay: Payer: Medicare HMO

## 2022-03-22 ENCOUNTER — Inpatient Hospital Stay (HOSPITAL_BASED_OUTPATIENT_CLINIC_OR_DEPARTMENT_OTHER): Payer: Medicare HMO | Admitting: Physician Assistant

## 2022-03-22 VITALS — BP 124/55 | HR 42 | Temp 97.6°F | Resp 16 | Wt 177.0 lb

## 2022-03-22 DIAGNOSIS — Z7982 Long term (current) use of aspirin: Secondary | ICD-10-CM | POA: Diagnosis not present

## 2022-03-22 DIAGNOSIS — E559 Vitamin D deficiency, unspecified: Secondary | ICD-10-CM | POA: Diagnosis not present

## 2022-03-22 DIAGNOSIS — M8580 Other specified disorders of bone density and structure, unspecified site: Secondary | ICD-10-CM

## 2022-03-22 DIAGNOSIS — M6281 Muscle weakness (generalized): Secondary | ICD-10-CM

## 2022-03-22 DIAGNOSIS — Z1231 Encounter for screening mammogram for malignant neoplasm of breast: Secondary | ICD-10-CM

## 2022-03-22 DIAGNOSIS — D72819 Decreased white blood cell count, unspecified: Secondary | ICD-10-CM

## 2022-03-22 DIAGNOSIS — Z7901 Long term (current) use of anticoagulants: Secondary | ICD-10-CM | POA: Diagnosis not present

## 2022-03-22 DIAGNOSIS — C50912 Malignant neoplasm of unspecified site of left female breast: Secondary | ICD-10-CM | POA: Diagnosis not present

## 2022-03-22 DIAGNOSIS — Z86711 Personal history of pulmonary embolism: Secondary | ICD-10-CM

## 2022-03-22 DIAGNOSIS — Z923 Personal history of irradiation: Secondary | ICD-10-CM

## 2022-03-22 DIAGNOSIS — Z86718 Personal history of other venous thrombosis and embolism: Secondary | ICD-10-CM | POA: Diagnosis not present

## 2022-03-22 DIAGNOSIS — G8929 Other chronic pain: Secondary | ICD-10-CM

## 2022-03-22 DIAGNOSIS — Z170 Estrogen receptor positive status [ER+]: Secondary | ICD-10-CM

## 2022-03-22 DIAGNOSIS — R2689 Other abnormalities of gait and mobility: Secondary | ICD-10-CM

## 2022-03-22 DIAGNOSIS — Z9012 Acquired absence of left breast and nipple: Secondary | ICD-10-CM

## 2022-03-22 MED ORDER — DENOSUMAB 60 MG/ML ~~LOC~~ SOSY
60.0000 mg | PREFILLED_SYRINGE | Freq: Once | SUBCUTANEOUS | Status: AC
  Administered 2022-03-22: 60 mg via SUBCUTANEOUS
  Filled 2022-03-22: qty 1

## 2022-03-22 MED ORDER — DENOSUMAB 60 MG/ML ~~LOC~~ SOLN: 60.0000 mg | Freq: Once | SUBCUTANEOUS | Status: DC

## 2022-03-22 NOTE — Therapy (Signed)
OUTPATIENT PHYSICAL THERAPY TREATMENT  Patient Name: Lisa Klein MRN: [id number] DOB:06-Sep-1929, 88 y.o., female Today's Date: 03/22/2022   PT End of Session - 03/22/22 1036     Visit Number 15    Number of Visits 18    Date for PT Re-Evaluation 04/02/22    Authorization Type [employer] HMO (auth required, no visit limit based on med necessity)    Authorization Time Period 8 visits approved 7/31-8/28    Authorization - Visit Number 6    Authorization - Number of Visits 8    Progress Note Due on Visit 30    PT Start Time 1035    PT Stop Time 1113    PT Time Calculation (min) 38 min    Activity Tolerance Patient tolerated treatment well    Behavior During Therapy [facility] for tasks assessed/performed                 Past Medical History:  Diagnosis Date   Cancer ([place]) 2012   LEFT BREAST   Clotting disorder ([place]) 05/2011   dvt, right leg   Degenerative disc disease    with nerve compression    DJD (degenerative joint disease)    Hypertension    Hypertension    Hypothyroidism    Hypothyroidism    Long term current use of anticoagulant therapy 12/07/2014   Pulmonary embolus    Obesity    Osteopenia 08/24/2014   Past Surgical History:  Procedure Laterality Date   ABDOMINAL HYSTERECTOMY  1980   fibroids   BREAST SURGERY  2012   left total mastectomy   CHOLECYSTECTOMY  80'S   [facility]   CHOLECYSTECTOMY     HIP PINNING,CANNULATED Left 01/19/2013   Procedure: CANNULATED HIP PINNING-  left;  Surgeon: Marin Shutter, MD;  Location: [place];  Service: Orthopedics;  Laterality: Left;   MASTECTOMY MODIFIED RADICAL  02/14/11   left   PORT-A-CATH REMOVAL N/A 12/05/2012   Procedure: MINOR REMOVAL PORT-A-CATH;  Surgeon: Jamesetta So, MD;  Location: [facility];  Service: General;  Laterality: N/A;  In Minor Room   PORTACATH PLACEMENT  04/16/2011   Procedure: INSERTION PORT-A-CATH;  Surgeon: Jamesetta So;  Location: [facility];  Service: General;  Laterality: Right;  right subclavian    VESICOVAGINAL FISTULA CLOSURE W/ TAH     [facility]   Patient Active Problem List   Diagnosis Date Noted   Bilateral shoulder pain 12/19/2021   Fatigue 03/27/2021   Shortness of breath 03/27/2021   GERD (gastroesophageal reflux disease) 03/27/2021   Knee pain, left 02/15/2020   Generalized osteoarthritis of multiple sites 04/02/2018   Allergic rhinitis 09/01/2015   Obesity 12/13/2014   Long term current use of anticoagulant therapy 12/07/2014   Osteopenia 08/24/2014   Infiltrating ductal carcinoma of breast ([place]) 04/10/2011   Hyperlipemia 11/07/2007   Hypothyroidism 08/15/2007   Essential hypertension 08/15/2007    PCP: [name] MD  REFERRING PROVIDER: [name] MD  REFERRING DIAG: M25.511,G89.29,M25.512 (ICD-10-CM) - Chronic pain of both shoulders M25.562,G89.29 (ICD-10-CM) - Chronic pain of left knee   THERAPY DIAG:  Chronic pain of left knee  Other abnormalities of gait and mobility  Muscle weakness (generalized)  Chronic right shoulder pain  Rationale for Evaluation and Treatment Rehabilitation  ONSET DATE: approximately 2013  SUBJECTIVE:   SUBJECTIVE STATEMENT: "Everything about the same". Still having trouble with balance/ walking. " Feel like I have to catch up sometime"   PERTINENT HISTORY:  Right shoulder soreness and limited AROM,  left arm soreness but more ROM  Left knee pain with genu valgum  Severe arthritis There is severe tricompartmental degenerative joint disease of the left knee with marked bone on bone and osteophytes more laterally and knock-knee deformity present.  No fracture or loose body noted.  Bone quality is good.  Breast cancer in 2012  Knee pain, left Increased pain , stiffness and instability    Bilateral shoulder pain Pain , stiffness and reduced shoulder mobility right greater than left  History of left hip fx with ORIF       PAIN:  Are you having pain? Yes: NPRS scale: 6/10 Pain location: left knee/  hips Pain description: shoulder aching. Knee aching Aggravating factors: standing and getting up or using arm in adls Relieving factors: massaging area  PRECAUTIONS: None  WEIGHT BEARING RESTRICTIONS No  FALLS:  Has patient fallen in last 6 months? No and patient states she has had near falls but catches herself with hands on wall etc.    LIVING ENVIRONMENT: Lives with: lives alone Lives in: House/apartment Stairs:  stairs outside to basement and on back deck but hasn't done them in 15 years.  Also has attic in home but again has not done in many years.  Has following equipment at home: Quad cane small base, Environmental consultant - 2 wheeled, Environmental consultant - 4 wheeled, shower chair, and Grab bars  OCCUPATION: retired  PLOF: Independent with household mobility with device and Independent with homemaking with ambulation  PATIENT GOALS   -Primary improve strength of knee, try to decrease pain, more steady on feet.  -Secondary improve right shoulder strength, improve range of right shoulder, and decrease pain.    OBJECTIVE:   DIAGNOSTIC FINDINGS:  Impression:  Severe tricompartmental degenerative joint disease with marked bone on bone laterally  PATIENT SURVEYS:  FOTO 50  COGNITION:  Overall cognitive status: Within functional limits for tasks assessed     SENSATION: WFL  EDEMA:  In left knee; medial and proximal to patella    POSTURE: rounded shoulders, forward head, and increased thoracic kyphosis    LOWER EXTREMITY MMT:  MMT Right eval Left eval  Hip flexion 4 4  Hip extension 3 3  Hip abduction    Hip adduction    Hip internal rotation    Hip external rotation    Knee flexion    Knee extension 3 (pts knee gives in pain with cracking with minimal pressure) 3-  Ankle dorsiflexion 3 with minimal pressure knees start cracking) 3(with minimal pressure knees start cracking)  Ankle plantarflexion    Ankle inversion 3 3  Ankle eversion 3    (Blank rows = not tested)  LOWER  EXTREMITY ROM:  AROM Right eval Left eval  Knee flexion 100 80  Knee extension -5 -20    UPPER EXTREMITY ROM:  Active ROM Right eval Left eval Right 03/01/22  Shoulder flexion 50 AROM 120 degrees and then start of pain  127 AROM in sitting  Shoulder extension     Shoulder abduction 60 degrees AROM  130 PROM     (Blank rows = not tested)  UPPER EXTREMITY MMT:  MMT Right eval Left eval Right 03/01/22  Shoulder flexion 3-  4  Shoulder extension     Shoulder abduction 3-    Elbow extension/ flexion 3+  4+/4+  Wrist flexion 3+  4+   (Blank rows = not tested)   FUNCTIONAL TESTS:  5 times sit to stand: 80.46 seconds with b/l UE support Timed  up and go (TUG): to Left 60.79 seconds with NBQC  GAIT: Distance walked: 100' Assistive device utilized: Lobbyist Level of assistance: SBA and CGA Comments: increwased trunk flexion, decreased cadence, short step length, poor placement of cane on ground, decreased weightbearing on left LE  Sit to stand  B/L ue use at Close supervision, pt with large BOS and must come up in increments, does not get knees flexed enough   TODAY'S TREATMENT: 03/22/22 Nustep seat 9, arms 7 x 6 min for mobility and endurance level 3 Standing:  Heel raises x 20 Marching x 20 alternating with 1lb STS from elevated surface 3 x 5     Hip Abduction 10X2  with 1lb each in upright posturing  Hip Extension 10X2 with 1lb each in upright posturing   Standing knee flexion 2 x 10 with 1lb  Semi tandem stance 3 x 20"   Sidestepping 5 RT in bars with 1 lb weights (no UE)  03/20/22 Nustep seat 9, arms 7 x 6 min for mobility and endurance level 3 Standing:  Heel raises x 20 Marching x 20 alternating   Hip Abduction 10X2 each in upright posturing  Hip Extension 10X2 each in upright posturing   Standing knee flexion 2 x 10   Step up 4 inch x 10 each HHA x 2    Band rows GTB 2 x 10  Shoulder extension GTB 2 x 10  Shoulder ER GTB 2 x 10  each  Seated  Pulleys flexion 2 min   03/15/2022 Nustep seat 9, arms 7 x 5 min for mobility and endurance above 55 SPM level 3 Standing: at counter Heel raises x 20 Marching x 20 alternating Tandem stance x 10 sec each; CGA for safety  Hip Abduction 10X2 each in upright posturing  Hip Extension 10X2 each in upright posturing Supine:  Bridge with 3" holds, VC's to lower slowly 2X10  Heelslides 2X10  SLR 2X5 each Kinesiotaping of Lt knee to increase stability Attempted sit to stands with mat raised and unable    PATIENT EDUCATION:  Education details: 6/14: Goals reviewed, HEP initiated; evaluation: Patient educated on exam findings, POC, scope of PT. Person educated: Patient Education method: Explanation, Demonstration, and Handouts Education comprehension: verbalized understanding, returned demonstration, verbal cues required, and tactile cues required    HOME EXERCISE PROGRAM: Access Code: [email or web address] URL: [email or web address] Date: 02/14/2022 Prepared by: [facility]  Exercises - Standing Hip Extension with Counter Support  - 2 x daily - 7 x weekly - 2 sets - 5 reps - Standing Hip Abduction with Counter Support  - 2 x daily - 7 x weekly - 2 sets - 5 reps - Standing March with Counter Support  - 2 x daily - 7 x weekly - 2 sets - 5 reps  Access Code: [email or web address] URL: [email or web address] Date: 01/17/2022 Prepared by: Roseanne Reno  Exercises - Seated Long Arc Quad  - 2 x daily - 7 x weekly - 2 sets - 10 reps - 5 seconds hold - Seated March  - 2 x daily - 7 x weekly - 2 sets - 10 reps - Supine Heel Slide  - 2 x daily - 7 x weekly - 2 sets - 10 reps - Bridge  - 2 x daily - 7 x weekly - 2 sets - 10 reps - Supine Shoulder Flexion with Dowel  - 2 x daily - 7 x weekly - 2 sets - 10 reps - Supine Shoulder Abduction AAROM  with Dowel  - 2 x daily - 7 x weekly - 2 sets - 10 reps  ASSESSMENT:  CLINICAL IMPRESSION: Incorporated balance activity today with  added semi tandem stance and sidestepping with no HHA. Patient did well with this, though did require verbal cues for upright posturing. Noting increased lumbar flexion with fatigue. Added ankle weights to standing hip series for LE strengthening. Patient tolerated well, though did note mild muscle fatigue. Patient will continue to benefit from skilled therapy services to reduce remaining deficits and improve functional ability.     OBJECTIVE IMPAIRMENTS Abnormal gait, decreased activity tolerance, decreased balance, decreased endurance, decreased knowledge of use of DME, decreased mobility, difficulty walking, decreased ROM, decreased strength, increased edema, increased fascial restrictions, impaired flexibility, impaired UE functional use, postural dysfunction, and pain.   ACTIVITY LIMITATIONS carrying, lifting, bending, standing, squatting, stairs, transfers, bed mobility, bathing, toileting, dressing, reach over head, and locomotion level  PARTICIPATION LIMITATIONS: cleaning, laundry, shopping, community activity, and yard work  PERSONAL FACTORS Age and 3+ comorbidities:    Allergies, Arthritis, Back pain, BMI over 30, Cancer, Hearing Impairment, High Blood Pressure, Osteoporosis, Prior Surgeryare also affecting patient's functional outcome.   REHAB POTENTIAL: Fair chronicity of impairment, multi areas affected  CLINICAL DECISION MAKING: Stable/uncomplicated  EVALUATION COMPLEXITY: High   GOALS: Goals reviewed with patient? Yes  SHORT TERM GOALS: Target date: 02/09/2022  Patient will be independent with HEP in order to improve functional outcomes. Baseline:  Goal status: met  2.  Patient will report at least 25% improvement in symptoms for improved quality of life. Baseline:  Goal status: met  3.  Patient will improve five time sit to stand by 20 seconds to work towards improved efficiency in transfer and demonstrate improving LE strength. Baseline: 5 times sit to stand: 80.46  seconds with b/l UE support; 02/21/22 63 sec; 03/01/22 55 sec Goal status:goal met  4.  Pt will improve TUG by 10 seconds to demonstrate improving efficiency in ambulation Baseline: 03/01/22 51 sec Timed up and go (TUG): to Left 60.79 seconds with NBQC Goal status: IN PROGRESS  5.  Pt will improve right shoulder arom to 70 degrees to work towards improved functional use in adls Baseline: 50 degrees Goal status:met    LONG TERM GOALS: Target date: 03/09/2022  1.  Patient will improve FOTO score by at least 10 points in order to indicate improved tolerance to activity. Baseline: 50 Goal status: IN PROGRESS  2. Patient will improve five time sit to stand by 40 seconds to work towards improved efficiency in transfer and demonstrate improving LE strength. Baseline: 5 times sit to stand: 80.46 seconds with b/l UE support Goal status: IN PROGRESS  4.  Pt will improve TUG by 20 seconds to demonstrate improving efficiency in ambulation Baseline:  Timed up and go (TUG): to Left 60.79 seconds with NBQC Goal status: IN PROGRESS  5.  Pt will improve right shoulder arom to 90 degrees to work towards improved functional use in adls Baseline: 50 degrees Goal status: IN PROGRESS    PLAN: PT FREQUENCY: 2x/week  PT DURATION: 8 weeks  PLANNED INTERVENTIONS: PLANNED INTERVENTIONS: Therapeutic exercises, Therapeutic activity, Neuromuscular re-education, Balance training, Gait training, Patient/Family education, Joint manipulation, Joint mobilization, Stair training, Orthotic/Fit training, DME instructions, Aquatic Therapy, Dry Needling, Electrical stimulation, Spinal manipulation, Spinal mobilization, Cryotherapy, Moist heat, Compression bandaging, scar mobilization, Splintting, Taping, Traction, Ultrasound, Ionotophoresis 68m/ml Dexamethasone, and Manual therapy   PLAN FOR NEXT SESSION: continue to build right shoulder strength and ROM  as well as B/L LE strength and stretching.  Recommend continued  therapy to address unmet and partially met goals.    10:37 AM, 03/22/22 Josue Hector PT DPT  Physical Therapist with [facility]  [phone]

## 2022-03-22 NOTE — Patient Instructions (Signed)
[place] at [facility] Discharge Instructions  You were seen today by [name] PA-C for your history of breast cancer and blood clots.  Your most recent mammogram and physical exam did not show any signs of breast cancer coming back.  You finished taking Arimidex as of April 2023.  You should continue to take your Xarelto for history of blood clots.  Let us know if you notice any major bleeding events.  You received Prolia today.  You will get your next injection at follow-up in 6 months.  You are due for BONE DENSITY SCAN and MAMMOGRAM in January 2024  LABS: Return in 6 months for repeat labs  FOLLOW-UP APPOINTMENT: Office visit in 6 months, after labs  ** Thank you for trusting me with your healthcare!  I strive to provide all of my patients with quality care at each visit.  If you receive a survey for this visit, I would be so grateful to you for taking the time to provide feedback.  Thank you in advance!  ~ Melik Blancett                   Dr. Derek Jack   &   [name], PA-C   - - - - - - - - - - - - - - - - - -     Thank you for choosing [place] at [facility] to provide your oncology and hematology care.  To afford each patient quality time with our provider, please arrive at least 15 minutes before your scheduled appointment time.   If you have a lab appointment with the [facility] please come in thru the Main Entrance and check in at the main information desk.  You need to re-schedule your appointment should you arrive 10 or more minutes late.  We strive to give you quality time with our providers, and arriving late affects you and other patients whose appointments are after yours.  Also, if you no show three or more times for appointments you may be dismissed from the clinic at the providers discretion.     Again, thank you for choosing [facility].  Our hope is that these requests will  decrease the amount of time that you wait before being seen by our physicians.       _____________________________________________________________  Should you have questions after your visit to [facility], please contact our office at [phone] and follow the prompts.  Our office hours are 8:00 a.m. and 4:30 p.m. Monday - Friday.  Please note that voicemails left after 4:00 p.m. may not be returned until the following business day.  We are closed weekends and major holidays.  You do have access to a nurse 24-7, just call the main number to the clinic [phone] and do not press any options, hold on the line and a nurse will answer the phone.    For prescription refill requests, have your pharmacy contact our office and allow 72 hours.    Due to Covid, you will need to wear a mask upon entering the hospital. If you do not have a mask, a mask will be given to you at the Main Entrance upon arrival. For doctor visits, patients may have 1 support person age 86 or older with them. For treatment visits, patients can not have anyone with them due to social distancing guidelines and our immunocompromised population.

## 2022-03-22 NOTE — Patient Instructions (Signed)
Lisa Klein  Discharge Instructions: Thank you for choosing [facility] to provide your oncology and hematology care.  If you have a lab appointment with the [facility], please come in thru the Main Entrance and check in at the main information desk.  Wear comfortable clothing and clothing appropriate for easy access to any Portacath or PICC line.   We strive to give you quality time with your provider. You may need to reschedule your appointment if you arrive late (15 or more minutes).  Arriving late affects you and other patients whose appointments are after yours.  Also, if you miss three or more appointments without notifying the office, you may be dismissed from the clinic at the provider's discretion.      For prescription refill requests, have your pharmacy contact our office and allow 72 hours for refills to be completed.    Today you received the following Prolia, return as scheduled.   To help prevent nausea and vomiting after your treatment, we encourage you to take your nausea medication as directed.  BELOW ARE SYMPTOMS THAT SHOULD BE REPORTED IMMEDIATELY: *FEVER GREATER THAN 100.4 F (38 C) OR HIGHER *CHILLS OR SWEATING *NAUSEA AND VOMITING THAT IS NOT CONTROLLED WITH YOUR NAUSEA MEDICATION *UNUSUAL SHORTNESS OF BREATH *UNUSUAL BRUISING OR BLEEDING *URINARY PROBLEMS (pain or burning when urinating, or frequent urination) *BOWEL PROBLEMS (unusual diarrhea, constipation, pain near the anus) TENDERNESS IN MOUTH AND THROAT WITH OR WITHOUT PRESENCE OF ULCERS (sore throat, sores in mouth, or a toothache) UNUSUAL RASH, SWELLING OR PAIN  UNUSUAL VAGINAL DISCHARGE OR ITCHING   Items with * indicate a potential emergency and should be followed up as soon as possible or go to the Emergency Department if any problems should occur.  Please show the CHEMOTHERAPY ALERT CARD or IMMUNOTHERAPY ALERT CARD at check-in to the Emergency Department and triage  nurse.  Should you have questions after your visit or need to cancel or reschedule your appointment, please contact [facility] [phone]  and follow the prompts.  Office hours are 8:00 a.m. to 4:30 p.m. Monday - Friday. Please note that voicemails left after 4:00 p.m. may not be returned until the following business day.  We are closed weekends and major holidays. You have access to a nurse at all times for urgent questions. Please call the main number to the clinic [phone] and follow the prompts.  For any non-urgent questions, you may also contact your provider using MyChart. We now offer e-Visits for anyone 41 and older to request care online for non-urgent symptoms. For details visit [email or web address].   Also download the MyChart app! Go to the app store, search "MyChart", open the app, select [facility], and log in with your MyChart username and password.  Masks are optional in the cancer centers. If you would like for your care team to wear a mask while they are taking care of you, please let them know. You may have one support person who is at least 86 years old accompany you for your appointments.

## 2022-03-22 NOTE — Progress Notes (Signed)
Patient and labs assessed by [name], PA. Patient okay for Prolia injection today per provider. Patient taking calcium as directed. Denied tooth, jaw, and leg pain. No recent or upcoming dental visits. Labs reviewed. Patient tolerated injection with no complaints voiced. See MAR for details. Patient stable during and after injection. Site clean and dry with no bruising or swelling noted. Band aid applied. Vss with discharge and left in satisfactory condition with no s/s of distress.

## 2022-03-27 ENCOUNTER — Ambulatory Visit (HOSPITAL_COMMUNITY): Payer: Medicare HMO | Admitting: Physical Therapy

## 2022-03-27 DIAGNOSIS — M6281 Muscle weakness (generalized): Secondary | ICD-10-CM

## 2022-03-27 DIAGNOSIS — R2689 Other abnormalities of gait and mobility: Secondary | ICD-10-CM

## 2022-03-27 DIAGNOSIS — Z9012 Acquired absence of left breast and nipple: Secondary | ICD-10-CM | POA: Diagnosis not present

## 2022-03-27 DIAGNOSIS — Z86711 Personal history of pulmonary embolism: Secondary | ICD-10-CM | POA: Diagnosis not present

## 2022-03-27 DIAGNOSIS — G8929 Other chronic pain: Secondary | ICD-10-CM

## 2022-03-27 DIAGNOSIS — Z7901 Long term (current) use of anticoagulants: Secondary | ICD-10-CM | POA: Diagnosis not present

## 2022-03-27 DIAGNOSIS — C50912 Malignant neoplasm of unspecified site of left female breast: Secondary | ICD-10-CM | POA: Diagnosis not present

## 2022-03-27 DIAGNOSIS — M8580 Other specified disorders of bone density and structure, unspecified site: Secondary | ICD-10-CM | POA: Diagnosis not present

## 2022-03-27 DIAGNOSIS — E559 Vitamin D deficiency, unspecified: Secondary | ICD-10-CM | POA: Diagnosis not present

## 2022-03-27 DIAGNOSIS — Z7982 Long term (current) use of aspirin: Secondary | ICD-10-CM | POA: Diagnosis not present

## 2022-03-27 DIAGNOSIS — Z86718 Personal history of other venous thrombosis and embolism: Secondary | ICD-10-CM | POA: Diagnosis not present

## 2022-03-27 DIAGNOSIS — Z170 Estrogen receptor positive status [ER+]: Secondary | ICD-10-CM | POA: Diagnosis not present

## 2022-03-27 NOTE — Therapy (Signed)
OUTPATIENT PHYSICAL THERAPY TREATMENT  Patient Name: Lisa Klein MRN: [id number] DOB:10-16-1929, 88 y.o., female Today's Date: 03/27/2022   PT End of Session - 03/27/22 1350     Visit Number 16    Number of Visits 18    Date for PT Re-Evaluation 04/02/22    Authorization Type [employer] H[employer] (auth required, no visit limit based on med necessity)    Authorization Time Period 8 visits approved 7/31-8/28    Authorization - Visit Number 7    Authorization - Number of Visits 8    Progress Note Due on Visit 52    PT Start Time 1345    PT Stop Time 1430    PT Time Calculation (min) 45 min    Activity Tolerance Patient tolerated treatment well    Behavior During Therapy [facility] for tasks assessed/performed                  Past Medical History:  Diagnosis Date   Cancer ([place]) 2012   LEFT BREAST   Clotting disorder ([place]) 05/2011   dvt, right leg   Degenerative disc disease    with nerve compression    DJD (degenerative joint disease)    Hypertension    Hypertension    Hypothyroidism    Hypothyroidism    Long term current use of anticoagulant therapy 12/07/2014   Pulmonary embolus    Obesity    Osteopenia 08/24/2014   Past Surgical History:  Procedure Laterality Date   ABDOMINAL HYSTERECTOMY  1980   fibroids   BREAST SURGERY  2012   left total mastectomy   CHOLECYSTECTOMY  80'S   [facility]   CHOLECYSTECTOMY     HIP PINNING,CANNULATED Left 01/19/2013   Procedure: CANNULATED HIP PINNING-  left;  Surgeon: Marin Shutter, MD;  Location: [facility];  Service: Orthopedics;  Laterality: Left;   MASTECTOMY MODIFIED RADICAL  02/14/11   left   PORT-A-CATH REMOVAL N/A 12/05/2012   Procedure: MINOR REMOVAL PORT-A-CATH;  Surgeon: Jamesetta So, MD;  Location: [facility];  Service: General;  Laterality: N/A;  In Minor Room   PORTACATH PLACEMENT  04/16/2011   Procedure: INSERTION PORT-A-CATH;  Surgeon: Jamesetta So;  Location: [facility];  Service: General;  Laterality: Right;  right  subclavian   VESICOVAGINAL FISTULA CLOSURE W/ TAH     [facility]   Patient Active Problem List   Diagnosis Date Noted   Bilateral shoulder pain 12/19/2021   Fatigue 03/27/2021   Shortness of breath 03/27/2021   GERD (gastroesophageal reflux disease) 03/27/2021   Knee pain, left 02/15/2020   Generalized osteoarthritis of multiple sites 04/02/2018   Allergic rhinitis 09/01/2015   Obesity 12/13/2014   Long term current use of anticoagulant therapy 12/07/2014   Osteopenia 08/24/2014   Infiltrating ductal carcinoma of breast ([place]) 04/10/2011   Hyperlipemia 11/07/2007   Hypothyroidism 08/15/2007   Essential hypertension 08/15/2007    PCP: [name] MD  REFERRING PROVIDER: [name] MD  REFERRING DIAG: M25.511,G89.29,M25.512 (ICD-10-CM) - Chronic pain of both shoulders M25.562,G89.29 (ICD-10-CM) - Chronic pain of left knee   THERAPY DIAG:  Chronic pain of left knee  Other abnormalities of gait and mobility  Muscle weakness (generalized)  Chronic right shoulder pain  Rationale for Evaluation and Treatment Rehabilitation  ONSET DATE: approximately 2013  SUBJECTIVE:   SUBJECTIVE STATEMENT: Pt reports no new issues; soreness is about the same.  Feels she will be able to continue on her own following discharge next session.  States she plans  on checking into silver sneakers at the [facility].  PERTINENT HISTORY:  Right shoulder soreness and limited AROM, left arm soreness but more ROM  Left knee pain with genu valgum  Severe arthritis There is severe tricompartmental degenerative joint disease of the left knee with marked bone on bone and osteophytes more laterally and knock-knee deformity present.  No fracture or loose body noted.  Bone quality is good.  Breast cancer in 2012  Knee pain, left Increased pain , stiffness and instability    Bilateral shoulder pain Pain , stiffness and reduced shoulder mobility right greater than left  History of left hip fx with ORIF        PAIN:  Are you having pain? Yes: NPRS scale: 6/10 Pain location: left knee/ hips Pain description: shoulder aching. Knee aching Aggravating factors: standing and getting up or using arm in adls Relieving factors: massaging area  PRECAUTIONS: None  WEIGHT BEARING RESTRICTIONS No  FALLS:  Has patient fallen in last 6 months? No and patient states she has had near falls but catches herself with hands on wall etc.    LIVING ENVIRONMENT: Lives with: lives alone Lives in: House/apartment Stairs:  stairs outside to basement and on back deck but hasn't done them in 15 years.  Also has attic in home but again has not done in many years.  Has following equipment at home: Quad cane small base, Environmental consultant - 2 wheeled, Environmental consultant - 4 wheeled, shower chair, and Grab bars  OCCUPATION: retired  PLOF: Independent with household mobility with device and Independent with homemaking with ambulation  PATIENT GOALS   -Primary improve strength of knee, try to decrease pain, more steady on feet.  -Secondary improve right shoulder strength, improve range of right shoulder, and decrease pain.    OBJECTIVE:   DIAGNOSTIC FINDINGS:  Impression:  Severe tricompartmental degenerative joint disease with marked bone on bone laterally  PATIENT SURVEYS:  FOTO 50  COGNITION:  Overall cognitive status: Within functional limits for tasks assessed     SENSATION: WFL  EDEMA:  In left knee; medial and proximal to patella    POSTURE: rounded shoulders, forward head, and increased thoracic kyphosis    LOWER EXTREMITY MMT:  MMT Right eval Left eval  Hip flexion 4 4  Hip extension 3 3  Hip abduction    Hip adduction    Hip internal rotation    Hip external rotation    Knee flexion    Knee extension 3 (pts knee gives in pain with cracking with minimal pressure) 3-  Ankle dorsiflexion 3 with minimal pressure knees start cracking) 3(with minimal pressure knees start cracking)  Ankle plantarflexion     Ankle inversion 3 3  Ankle eversion 3    (Blank rows = not tested)  LOWER EXTREMITY ROM:  AROM Right eval Left eval  Knee flexion 100 80  Knee extension -5 -20    UPPER EXTREMITY ROM:  Active ROM Right eval Left eval Right 03/01/22  Shoulder flexion 50 AROM 120 degrees and then start of pain  127 AROM in sitting  Shoulder extension     Shoulder abduction 60 degrees AROM  130 PROM     (Blank rows = not tested)  UPPER EXTREMITY MMT:  MMT Right eval Left eval Right 03/01/22  Shoulder flexion 3-  4  Shoulder extension     Shoulder abduction 3-    Elbow extension/ flexion 3+  4+/4+  Wrist flexion 3+  4+   (Blank rows = not  tested)   FUNCTIONAL TESTS:  5 times sit to stand: 80.46 seconds with b/l UE support Timed up and go (TUG): to Left 60.79 seconds with NBQC  GAIT: Distance walked: 100' Assistive device utilized: Lobbyist Level of assistance: SBA and CGA Comments: increwased trunk flexion, decreased cadence, short step length, poor placement of cane on ground, decreased weightbearing on left LE  Sit to stand  B/L ue use at Close supervision, pt with large BOS and must come up in increments, does not get knees flexed enough   TODAY'S TREATMENT: 03/27/2022 Nustep seat 9, arms 7 x 5 min for mobility and endurance above 55 SPM level 3 Standing: Heel raises x 20 Marching x 20 with 1# ankle weight, 1 UE assist  Hip Abduction 10X2 each in upright posturing with 1# ankle weights  Hip Extension 10X2 each in upright posturing with 1# ankle weights  Semi tandem stance 3 x 20"   Side stepping 10X each with 1# DB lateral raises, no UE assist STS from elevated surface 3X with HHA from therapist with LE's under and less UE assist Supine: Bridge 10X3 sets, 3" holds    03/22/22 Nustep seat 9, arms 7 x 6 min for mobility and endurance level 3 Standing:  Heel raises x 20 Marching x 20 alternating with 1lb STS from elevated surface 3 x 5 Hip Abduction  10X2  with 1lb each in upright posturing  Hip Extension 10X2 with 1lb each in upright posturing   Standing knee flexion 2 x 10 with 1lb  Semi tandem stance 3 x 20"   Sidestepping 5 RT in bars with 1 lb weights (no UE)  03/20/22 Nustep seat 9, arms 7 x 6 min for mobility and endurance level 3 Standing:  Heel raises x 20 Marching x 20 alternating   Hip Abduction 10X2 each in upright posturing  Hip Extension 10X2 each in upright posturing   Standing knee flexion 2 x 10   Step up 4 inch x 10 each HHA x 2    Band rows GTB 2 x 10  Shoulder extension GTB 2 x 10  Shoulder ER GTB 2 x 10 each  Seated  Pulleys flexion 2 min   03/15/2022 Nustep seat 9, arms 7 x 5 min for mobility and endurance above 55 SPM level 3 Standing: at counter Heel raises x 20 Marching x 20 alternating Tandem stance x 10 sec each; CGA for safety  Hip Abduction 10X2 each in upright posturing  Hip Extension 10X2 each in upright posturing Supine:  Bridge with 3" holds, VC's to lower slowly 2X10  Heelslides 2X10  SLR 2X5 each Kinesiotaping of Lt knee to increase stability Attempted sit to stands with mat raised and unable    PATIENT EDUCATION:  Education details: 6/14: Goals reviewed, HEP initiated; evaluation: Patient educated on exam findings, POC, scope of PT. Person educated: Patient Education method: Explanation, Demonstration, and Handouts Education comprehension: verbalized understanding, returned demonstration, verbal cues required, and tactile cues required    HOME EXERCISE PROGRAM: Access Code: [email or web address] URL: [email or web address] Date: 02/14/2022 Prepared by: [facility]  Exercises - Standing Hip Extension with Counter Support  - 2 x daily - 7 x weekly - 2 sets - 5 reps - Standing Hip Abduction with Counter Support  - 2 x daily - 7 x weekly - 2 sets - 5 reps - Standing March with Counter Support  - 2 x daily - 7 x weekly - 2 sets - 5 reps  Access Code: [email or web address] URL:  [email or web address] Date: 01/17/2022 Prepared by: Roseanne Reno  Exercises - Seated Long Arc Quad  - 2 x daily - 7 x weekly - 2 sets - 10 reps - 5 seconds hold - Seated March  - 2 x daily - 7 x weekly - 2 sets - 10 reps - Supine Heel Slide  - 2 x daily - 7 x weekly - 2 sets - 10 reps - Bridge  - 2 x daily - 7 x weekly - 2 sets - 10 reps - Supine Shoulder Flexion with Dowel  - 2 x daily - 7 x weekly - 2 sets - 10 reps - Supine Shoulder Abduction AAROM with Dowel  - 2 x daily - 7 x weekly - 2 sets - 10 reps  ASSESSMENT:  CLINICAL IMPRESSION: Continued with focus on improving LE strength and stability as well as UE function and strength.  Challenged with less use of UE to assist and rather combining UE and LE strengthening simultaneously with side stepping activity.  Max verbal cues needed for upright posturing with all activities as tends to increase forward flexion with activities.   Worked on keeping feet underneath BOS with standing with cues to engage gluteal mm.  This is still very challenging for patient with black foam and Red disc in standard chair. Continued with bridge to work on improving gluteal strength as well.   Patient tolerated well but with noted muscle fatigue. Patient will continue to benefit from skilled therapy services to reduce remaining deficits and improve functional ability.     OBJECTIVE IMPAIRMENTS Abnormal gait, decreased activity tolerance, decreased balance, decreased endurance, decreased knowledge of use of DME, decreased mobility, difficulty walking, decreased ROM, decreased strength, increased edema, increased fascial restrictions, impaired flexibility, impaired UE functional use, postural dysfunction, and pain.   ACTIVITY LIMITATIONS carrying, lifting, bending, standing, squatting, stairs, transfers, bed mobility, bathing, toileting, dressing, reach over head, and locomotion level  PARTICIPATION LIMITATIONS: cleaning, laundry, shopping, community  activity, and yard work  PERSONAL FACTORS Age and 3+ comorbidities:    Allergies, Arthritis, Back pain, BMI over 30, Cancer, Hearing Impairment, High Blood Pressure, Osteoporosis, Prior Surgeryare also affecting patient's functional outcome.   REHAB POTENTIAL: Fair chronicity of impairment, multi areas affected  CLINICAL DECISION MAKING: Stable/uncomplicated  EVALUATION COMPLEXITY: High   GOALS: Goals reviewed with patient? Yes  SHORT TERM GOALS: Target date: 02/09/2022  Patient will be independent with HEP in order to improve functional outcomes. Baseline:  Goal status: met  2.  Patient will report at least 25% improvement in symptoms for improved quality of life. Baseline:  Goal status: met  3.  Patient will improve five time sit to stand by 20 seconds to work towards improved efficiency in transfer and demonstrate improving LE strength. Baseline: 5 times sit to stand: 80.46 seconds with b/l UE support; 02/21/22 63 sec; 03/01/22 55 sec Goal status:goal met  4.  Pt will improve TUG by 10 seconds to demonstrate improving efficiency in ambulation Baseline: 03/01/22 51 sec Timed up and go (TUG): to Left 60.79 seconds with NBQC Goal status: IN PROGRESS  5.  Pt will improve right shoulder arom to 70 degrees to work towards improved functional use in adls Baseline: 50 degrees Goal status:met    LONG TERM GOALS: Target date: 03/09/2022  1.  Patient will improve FOTO score by at least 10 points in order to indicate improved tolerance to activity. Baseline: 50 Goal status: IN PROGRESS  2. Patient will improve five  time sit to stand by 40 seconds to work towards improved efficiency in transfer and demonstrate improving LE strength. Baseline: 5 times sit to stand: 80.46 seconds with b/l UE support Goal status: IN PROGRESS  4.  Pt will improve TUG by 20 seconds to demonstrate improving efficiency in ambulation Baseline:  Timed up and go (TUG): to Left 60.79 seconds with NBQC Goal  status: IN PROGRESS  5.  Pt will improve right shoulder arom to 90 degrees to work towards improved functional use in adls Baseline: 50 degrees Goal status: IN PROGRESS    PLAN: PT FREQUENCY: 2x/week  PT DURATION: 8 weeks  PLANNED INTERVENTIONS: PLANNED INTERVENTIONS: Therapeutic exercises, Therapeutic activity, Neuromuscular re-education, Balance training, Gait training, Patient/Family education, Joint manipulation, Joint mobilization, Stair training, Orthotic/Fit training, DME instructions, Aquatic Therapy, Dry Needling, Electrical stimulation, Spinal manipulation, Spinal mobilization, Cryotherapy, Moist heat, Compression bandaging, scar mobilization, Splintting, Taping, Traction, Ultrasound, Ionotophoresis 59m/ml Dexamethasone, and Manual therapy   PLAN FOR NEXT SESSION:  Re evaluate for possible discharge next session.  1:50 PM, 03/27/22 ATeena Irani PTA/CLT CGlassportPh: [phone]

## 2022-03-29 ENCOUNTER — Ambulatory Visit (HOSPITAL_COMMUNITY): Payer: Medicare HMO | Admitting: Physical Therapy

## 2022-03-29 DIAGNOSIS — M8580 Other specified disorders of bone density and structure, unspecified site: Secondary | ICD-10-CM | POA: Diagnosis not present

## 2022-03-29 DIAGNOSIS — M6281 Muscle weakness (generalized): Secondary | ICD-10-CM

## 2022-03-29 DIAGNOSIS — Z86711 Personal history of pulmonary embolism: Secondary | ICD-10-CM | POA: Diagnosis not present

## 2022-03-29 DIAGNOSIS — Z170 Estrogen receptor positive status [ER+]: Secondary | ICD-10-CM | POA: Diagnosis not present

## 2022-03-29 DIAGNOSIS — C50912 Malignant neoplasm of unspecified site of left female breast: Secondary | ICD-10-CM | POA: Diagnosis not present

## 2022-03-29 DIAGNOSIS — Z7982 Long term (current) use of aspirin: Secondary | ICD-10-CM | POA: Diagnosis not present

## 2022-03-29 DIAGNOSIS — Z9012 Acquired absence of left breast and nipple: Secondary | ICD-10-CM | POA: Diagnosis not present

## 2022-03-29 DIAGNOSIS — R2689 Other abnormalities of gait and mobility: Secondary | ICD-10-CM

## 2022-03-29 DIAGNOSIS — Z7901 Long term (current) use of anticoagulants: Secondary | ICD-10-CM | POA: Diagnosis not present

## 2022-03-29 DIAGNOSIS — E559 Vitamin D deficiency, unspecified: Secondary | ICD-10-CM | POA: Diagnosis not present

## 2022-03-29 DIAGNOSIS — G8929 Other chronic pain: Secondary | ICD-10-CM

## 2022-03-29 DIAGNOSIS — Z86718 Personal history of other venous thrombosis and embolism: Secondary | ICD-10-CM | POA: Diagnosis not present

## 2022-03-29 NOTE — Therapy (Signed)
OUTPATIENT PHYSICAL THERAPY TREATMENT  Patient Name: Lisa Klein MRN: [id number] DOB:07-26-1930, 88 y.o., female Today's Date: 03/29/2022   PHYSICAL THERAPY DISCHARGE SUMMARY  Visits from Start of Care: 17  Current functional level related to goals / functional outcomes: See below   Remaining deficits: See below   Education / Equipment: See assessment   Patient agrees to discharge. Patient goals were partially met. Patient is being discharged due to being pleased with the current functional level.   PT End of Session - 03/29/22 1311     Visit Number 17    Number of Visits 18    Date for PT Re-Evaluation 04/02/22    Authorization Type [employer] H[employer] (auth required, no visit limit based on med necessity)    Authorization Time Period 8 visits approved 7/31-8/28    Authorization - Visit Number 8    Authorization - Number of Visits 8    Progress Note Due on Visit 72    PT Start Time 6203    PT Stop Time 1343    PT Time Calculation (min) 36 min    Activity Tolerance Patient tolerated treatment well    Behavior During Therapy [facility] for tasks assessed/performed                  Past Medical History:  Diagnosis Date   Cancer ([place]) 2012   LEFT BREAST   Clotting disorder ([place]) 05/2011   dvt, right leg   Degenerative disc disease    with nerve compression    DJD (degenerative joint disease)    Hypertension    Hypertension    Hypothyroidism    Hypothyroidism    Long term current use of anticoagulant therapy 12/07/2014   Pulmonary embolus    Obesity    Osteopenia 08/24/2014   Past Surgical History:  Procedure Laterality Date   ABDOMINAL HYSTERECTOMY  1980   fibroids   BREAST SURGERY  2012   left total mastectomy   CHOLECYSTECTOMY  80'S   [facility]   CHOLECYSTECTOMY     HIP PINNING,CANNULATED Left 01/19/2013   Procedure: CANNULATED HIP PINNING-  left;  Surgeon: Marin Shutter, MD;  Location: [place];  Service: Orthopedics;  Laterality: Left;   MASTECTOMY  MODIFIED RADICAL  02/14/11   left   PORT-A-CATH REMOVAL N/A 12/05/2012   Procedure: MINOR REMOVAL PORT-A-CATH;  Surgeon: Jamesetta So, MD;  Location: [facility];  Service: General;  Laterality: N/A;  In Minor Room   PORTACATH PLACEMENT  04/16/2011   Procedure: INSERTION PORT-A-CATH;  Surgeon: Jamesetta So;  Location: [facility];  Service: General;  Laterality: Right;  right subclavian   VESICOVAGINAL FISTULA CLOSURE W/ TAH     [facility]   Patient Active Problem List   Diagnosis Date Noted   Bilateral shoulder pain 12/19/2021   Fatigue 03/27/2021   Shortness of breath 03/27/2021   GERD (gastroesophageal reflux disease) 03/27/2021   Knee pain, left 02/15/2020   Generalized osteoarthritis of multiple sites 04/02/2018   Allergic rhinitis 09/01/2015   Obesity 12/13/2014   Long term current use of anticoagulant therapy 12/07/2014   Osteopenia 08/24/2014   Infiltrating ductal carcinoma of breast ([place]) 04/10/2011   Hyperlipemia 11/07/2007   Hypothyroidism 08/15/2007   Essential hypertension 08/15/2007    PCP: [name] MD  REFERRING PROVIDER: [name] MD  REFERRING DIAG: M25.511,G89.29,M25.512 (ICD-10-CM) - Chronic pain of both shoulders M25.562,G89.29 (ICD-10-CM) - Chronic pain of left knee   THERAPY DIAG:  Chronic pain of left knee  Other abnormalities of gait and mobility  Muscle weakness (generalized)  Chronic right shoulder pain  Rationale for Evaluation and Treatment Rehabilitation  ONSET DATE: approximately 2013  SUBJECTIVE:   SUBJECTIVE STATEMENT: Patient sates she is doing better overall. She notes about 15-25% improvement in function since starting therapy. Still having some trouble with LT hip and legs. She states she feels ready for DC today.   PERTINENT HISTORY:  Right shoulder soreness and limited AROM, left arm soreness but more ROM  Left knee pain with genu valgum  Severe arthritis There is severe tricompartmental degenerative joint disease of the left  knee with marked bone on bone and osteophytes more laterally and knock-knee deformity present.  No fracture or loose body noted.  Bone quality is good.  Breast cancer in 2012  Knee pain, left Increased pain , stiffness and instability    Bilateral shoulder pain Pain , stiffness and reduced shoulder mobility right greater than left  History of left hip fx with ORIF   PAIN:  Are you having pain? Yes: NPRS scale: 4/10 Pain location: left knee/ hips Pain description: shoulder aching. Knee aching Aggravating factors: standing and getting up or using arm in adls Relieving factors: massaging area  PRECAUTIONS: None  WEIGHT BEARING RESTRICTIONS No  FALLS:  Has patient fallen in last 6 months? No and patient states she has had near falls but catches herself with hands on wall etc.    LIVING ENVIRONMENT: Lives with: lives alone Lives in: House/apartment Stairs:  stairs outside to basement and on back deck but hasn't done them in 15 years.  Also has attic in home but again has not done in many years.  Has following equipment at home: Quad cane small base, Environmental consultant - 2 wheeled, Environmental consultant - 4 wheeled, shower chair, and Grab bars  OCCUPATION: retired  PLOF: Independent with household mobility with device and Independent with homemaking with ambulation  PATIENT GOALS   -Primary improve strength of knee, try to decrease pain, more steady on feet.  -Secondary improve right shoulder strength, improve range of right shoulder, and decrease pain.    OBJECTIVE:   DIAGNOSTIC FINDINGS:  Impression:  Severe tricompartmental degenerative joint disease with marked bone on bone laterally  PATIENT SURVEYS:  FOTO 53% function   COGNITION:  Overall cognitive status: Within functional limits for tasks assessed     SENSATION: WFL  EDEMA:  In left knee; medial and proximal to patella    POSTURE: rounded shoulders, forward head, and increased thoracic kyphosis    LOWER EXTREMITY MMT:  MMT  Right eval Left eval Right 03/29/22 Left 03/29/22  Hip flexion 4 4 4+ 4+  Hip extension 3 3    Hip abduction      Hip adduction      Hip internal rotation      Hip external rotation      Knee flexion      Knee extension 3 (pts knee gives in pain with cracking with minimal pressure) 3- 4+ 4+  Ankle dorsiflexion 3 with minimal pressure knees start cracking) 3(with minimal pressure knees start cracking) 4+ 4+  Ankle plantarflexion      Ankle inversion 3 3    Ankle eversion 3      (Blank rows = not tested)  LOWER EXTREMITY ROM:  AROM Right eval Left eval  Knee flexion 100 80  Knee extension -5 -20    UPPER EXTREMITY ROM:  Active ROM Right eval Left eval Right 03/01/22 Right  03/29/22  Shoulder flexion 50 AROM 120 degrees and then start of pain  127 AROM in sitting 120 deg (seated)  Shoulder extension      Shoulder abduction 60 degrees AROM  130 PROM      (Blank rows = not tested)  UPPER EXTREMITY MMT:  MMT Right eval Left eval Right 03/01/22  Shoulder flexion 3-  4  Shoulder extension     Shoulder abduction 3-    Elbow extension/ flexion 3+  4+/4+  Wrist flexion 3+  4+   (Blank rows = not tested)   FUNCTIONAL TESTS:  5 times sit to stand: 51 sec with UE (was 80.46 seconds with b/l UE support) Timed up and go (TUG): 29 sec with small base quad cane (was 60.79 seconds with NBQC)  GAIT: Distance walked: 100' Assistive device utilized: Herbalist base Level of assistance: SBA and CGA Comments: increwased trunk flexion, decreased cadence, short step length, poor placement of cane on ground, decreased weightbearing on left LE  Sit to stand  B/L ue use at Close supervision, pt with large BOS and must come up in increments, does not get knees flexed enough   TODAY'S TREATMENT: 03/29/22 Reassess FOTO TUG 5 x STS AROM MMT HEP review   03/27/2022 Nustep seat 9, arms 7 x 5 min for mobility and endurance above 55 SPM level 3 Standing: Heel raises x  20 Marching x 20 with 1# ankle weight, 1 UE assist  Hip Abduction 10X2 each in upright posturing with 1# ankle weights  Hip Extension 10X2 each in upright posturing with 1# ankle weights  Semi tandem stance 3 x 20"   Side stepping 10X each with 1# DB lateral raises, no UE assist STS from elevated surface 3X with HHA from therapist with LE's under and less UE assist Supine: Bridge 10X3 sets, 3" holds    03/22/22 Nustep seat 9, arms 7 x 6 min for mobility and endurance level 3 Standing:  Heel raises x 20 Marching x 20 alternating with 1lb STS from elevated surface 3 x 5 Hip Abduction 10X2  with 1lb each in upright posturing  Hip Extension 10X2 with 1lb each in upright posturing   Standing knee flexion 2 x 10 with 1lb  Semi tandem stance 3 x 20"   Sidestepping 5 RT in bars with 1 lb weights (no UE)  03/20/22 Nustep seat 9, arms 7 x 6 min for mobility and endurance level 3 Standing:  Heel raises x 20 Marching x 20 alternating   Hip Abduction 10X2 each in upright posturing  Hip Extension 10X2 each in upright posturing   Standing knee flexion 2 x 10   Step up 4 inch x 10 each HHA x 2    Band rows GTB 2 x 10  Shoulder extension GTB 2 x 10  Shoulder ER GTB 2 x 10 each  Seated  Pulleys flexion 2 min   03/15/2022 Nustep seat 9, arms 7 x 5 min for mobility and endurance above 55 SPM level 3 Standing: at counter Heel raises x 20 Marching x 20 alternating Tandem stance x 10 sec each; CGA for safety  Hip Abduction 10X2 each in upright posturing  Hip Extension 10X2 each in upright posturing Supine:  Bridge with 3" holds, VC's to lower slowly 2X10  Heelslides 2X10  SLR 2X5 each Kinesiotaping of Lt knee to increase stability Attempted sit to stands with mat raised and unable    PATIENT EDUCATION:  Education details: 6/14: Goals reviewed, HEP initiated;  evaluation: Patient educated on exam findings, POC, scope of PT. Person educated: Patient Education method: Explanation,  Demonstration, and Handouts Education comprehension: verbalized understanding, returned demonstration, verbal cues required, and tactile cues required    HOME EXERCISE PROGRAM:   03/29/22 Access Code: [email or web address] URL: [email or web address] Date: 03/29/2022 Prepared by: Josue Hector  Exercises - Seated Long Arc Quad  - 1-2 x daily - 5 x weekly - 2 sets - 10 reps - 5 seconds hold - Seated March  - 1-2 x daily - 5 x weekly - 2 sets - 10 reps - Supine Heel Slide  - 1-2 x daily - 5 x weekly - 2 sets - 10 reps - Bridge  - 1-2 x daily - 5 x weekly - 2 sets - 10 reps - Supine Shoulder Flexion with Dowel  - 1-2 x daily - 5 x weekly - 2 sets - 10 reps - Supine Shoulder Abduction AAROM with Dowel  - 1-2 x daily - 5 x weekly - 2 sets - 10 reps - Heel Raises with Counter Support  - 1-2 x daily - 5 x weekly - 2 sets - 10 reps - Sit to Stand with Counter Support  - 1-2 x daily - 5 x weekly - 3 sets - 5 reps - Side Stepping with Counter Support  - 1-2 x daily - 5 x weekly - 1 sets - 10 reps  Access Code: [email or web address] URL: [email or web address] Date: 02/14/2022 Prepared by: [facility]  Exercises - Standing Hip Extension with Counter Support  - 2 x daily - 7 x weekly - 2 sets - 5 reps - Standing Hip Abduction with Counter Support  - 2 x daily - 7 x weekly - 2 sets - 5 reps - Standing March with Counter Support  - 2 x daily - 7 x weekly - 2 sets - 5 reps  Access Code: [email or web address] URL: [email or web address] Date: 01/17/2022 Prepared by: Roseanne Reno  Exercises - Seated Long Arc Quad  - 2 x daily - 7 x weekly - 2 sets - 10 reps - 5 seconds hold - Seated March  - 2 x daily - 7 x weekly - 2 sets - 10 reps - Supine Heel Slide  - 2 x daily - 7 x weekly - 2 sets - 10 reps - Bridge  - 2 x daily - 7 x weekly - 2 sets - 10 reps - Supine Shoulder Flexion with Dowel  - 2 x daily - 7 x weekly - 2 sets - 10 reps - Supine Shoulder Abduction AAROM with Dowel  - 2 x daily -  7 x weekly - 2 sets - 10 reps  ASSESSMENT:  CLINICAL IMPRESSION: Patient has made good progress. Currently with goals partially MET. Significant improvement in functional level, gait and balance. Improved strength as well. Patient does report limited subjective improvement and notes ongoing pain in knee and hips which continues to impact function, but feels pleased with current level of function and request DC to HEP today. Reviewed HEP and answered all patient questions. Issued updated handout. Patient encouraged to follow up with therapy services with any further questions or concerns.    OBJECTIVE IMPAIRMENTS Abnormal gait, decreased activity tolerance, decreased balance, decreased endurance, decreased knowledge of use of DME, decreased mobility, difficulty walking, decreased ROM, decreased strength, increased edema, increased fascial restrictions, impaired flexibility, impaired UE functional use, postural dysfunction, and pain.   ACTIVITY LIMITATIONS carrying, lifting, bending, standing, squatting, stairs, transfers, bed mobility, bathing, toileting, dressing, reach  over head, and locomotion level  PARTICIPATION LIMITATIONS: cleaning, laundry, shopping, community activity, and yard work  PERSONAL FACTORS Age and 3+ comorbidities:    Allergies, Arthritis, Back pain, BMI over 30, Cancer, Hearing Impairment, High Blood Pressure, Osteoporosis, Prior Surgeryare also affecting patient's functional outcome.   REHAB POTENTIAL: Fair chronicity of impairment, multi areas affected  CLINICAL DECISION MAKING: Stable/uncomplicated  EVALUATION COMPLEXITY: High   GOALS: Goals reviewed with patient? Yes  SHORT TERM GOALS: Target date: 02/09/2022  Patient will be independent with HEP in order to improve functional outcomes. Baseline:  Goal status: met  2.  Patient will report at least 25% improvement in symptoms for improved quality of life. Baseline: 15-25%  Goal status: met  3.  Patient will  improve five time sit to stand by 20 seconds to work towards improved efficiency in transfer and demonstrate improving LE strength. Baseline:80.4 sec, as of 8.24.23 51 sec with use of UEs Goal status:goal met  4.  Pt will improve TUG by 10 seconds to demonstrate improving efficiency in ambulation Baseline: 03/01/22 51 sec; 03/29/22 29 sec with small base quad cane  Timed up and go (TUG): to Left 60.79 seconds with NBQC Goal status: MET  5.  Pt will improve right shoulder arom to 70 degrees to work towards improved functional use in adls Baseline: 505 degrees Goal status: MET    LONG TERM GOALS: Target date: 03/09/2022  1.  Patient will improve FOTO score by at least 10 points in order to indicate improved tolerance to activity. Baseline: 53% Goal status: Not MET  2. Patient will improve five time sit to stand by 40 seconds to work towards improved efficiency in transfer and demonstrate improving LE strength. Baseline: Improved 30 sec  Goal status: not MET  4.  Pt will improve TUG by 20 seconds to demonstrate improving efficiency in ambulation Baseline:  Timed up and go (TUG): to Left 60.79 seconds with NBQC Goal status: MET  5.  Pt will improve right shoulder arom to 90 degrees to work towards improved functional use in adls Baseline: 397 degrees Goal status: MET    PLAN: PT FREQUENCY: 2x/week  PT DURATION: 8 weeks  PLANNED INTERVENTIONS: PLANNED INTERVENTIONS: Therapeutic exercises, Therapeutic activity, Neuromuscular re-education, Balance training, Gait training, Patient/Family education, Joint manipulation, Joint mobilization, Stair training, Orthotic/Fit training, DME instructions, Aquatic Therapy, Dry Needling, Electrical stimulation, Spinal manipulation, Spinal mobilization, Cryotherapy, Moist heat, Compression bandaging, scar mobilization, Splintting, Taping, Traction, Ultrasound, Ionotophoresis 11m/ml Dexamethasone, and Manual therapy   PLAN FOR NEXT SESSION:  DC to HEP    1:43 PM, 03/29/22 CJosue HectorPT DPT  Physical Therapist with [facility] [phone]

## 2022-04-03 ENCOUNTER — Encounter (HOSPITAL_COMMUNITY): Payer: Medicare HMO

## 2022-04-05 ENCOUNTER — Encounter (HOSPITAL_COMMUNITY): Payer: Medicare HMO | Admitting: Physical Therapy

## 2022-04-10 ENCOUNTER — Encounter (HOSPITAL_COMMUNITY): Payer: Medicare HMO

## 2022-04-12 ENCOUNTER — Encounter: Payer: Self-pay | Admitting: Family Medicine

## 2022-04-12 ENCOUNTER — Ambulatory Visit (INDEPENDENT_AMBULATORY_CARE_PROVIDER_SITE_OTHER): Payer: Medicare HMO | Admitting: Family Medicine

## 2022-04-12 VITALS — BP 138/64 | HR 70 | Resp 16 | Ht 63.0 in | Wt 175.0 lb

## 2022-04-12 DIAGNOSIS — Z23 Encounter for immunization: Secondary | ICD-10-CM

## 2022-04-12 DIAGNOSIS — I10 Essential (primary) hypertension: Secondary | ICD-10-CM

## 2022-04-12 DIAGNOSIS — E7849 Other hyperlipidemia: Secondary | ICD-10-CM

## 2022-04-12 DIAGNOSIS — Z0001 Encounter for general adult medical examination with abnormal findings: Secondary | ICD-10-CM | POA: Insufficient documentation

## 2022-04-12 NOTE — Patient Instructions (Addendum)
F/U in 6 months, call if you need me sooner  FLU VACCINE TODAY  NON FASTING LIPID AND TSH TODAY  HEARING IS GOOD AND EAR EXAM IS NORMAL  RIGHT EYE VISION IS POOR  GOOD THAT YOU BENEFITED FROM PT KEEP DOING EXERCISES AT HOME AND LOOK AT ANY EXERCISES YOU CAN GET AT THE [facility], NOT THE [facility], HAS TO BE SITTING EXERCISE  USE CANE AND BE CAREFUL NOT TO FALL  PLEASE GET COVID BOOSTER WHEN AVAILABLE  Thanks for choosing [facility], we consider it a privelige to serve you.

## 2022-04-12 NOTE — Assessment & Plan Note (Signed)
Annual exam as documented. . Immunization and cancer screening needs are specifically addressed at this visit.  

## 2022-04-12 NOTE — Progress Notes (Signed)
    Lisa Klein     MRN: [id number]      DOB: Apr 10, 1930  HPI: Patient is in for annual physical exam. C/o painless lump on left wrist for years, no significant growth over time. Recent labs,  are reviewed. Immunization is reviewed , and  updated if needed.   PE: BP 138/64   Pulse 70   Resp 16   Ht '5\' 3"'$  (1.6 m)   Wt 175 lb (79.4 kg)   SpO2 95%   BMI 31.00 kg/m  Pleasant  female, alert and oriented x 3, in no cardio-pulmonary distress. Afebrile. HEENT No facial trauma or asymetry. Sinuses non tender.  Extra occullar muscles intact.. External ears normal, . Neck: decreased ROM, no adenopathy,JVD or thyromegaly.No bruits.  Chest: Clear to ascultation bilaterally.No crackles or wheezes. Non tender to palpation   Cardiovascular system; Heart sounds normal,  S1 and  S2 ,no S3.  No murmur, or thrill. Apical beat not displaced Peripheral pulses normal.  Abdomen: Soft, non tender,     Musculoskeletal exam: Decreased  ROM of spine, hips ,  markedly reduced in shoulders and reduced in knees.  deformity ,swelling and crepitus noted. No muscle wasting or atrophy.   Neurologic: Cranial nerves 2 to 12 intact. Power, tone ,sensation  normal throughout. disturbance in gait. No tremor.Ambulates with a cane  Skin: Intact, no ulceration, erythema , scaling or rash noted.Lipoma on left forearm Pigmentation normal throughout  Psych; Normal mood and affect. Judgement and concentration normal   Assessment & Plan:  Encounter for annual general medical examination with abnormal findings in adult Annual exam as documented.  Immunization and cancer screening needs are specifically addressed at this visit.

## 2022-04-13 ENCOUNTER — Encounter (HOSPITAL_COMMUNITY): Payer: Medicare HMO

## 2022-04-13 LAB — LIPID PANEL
Chol/HDL Ratio: 2.5 ratio (ref 0.0–4.4)
Cholesterol, Total: 165 mg/dL (ref 100–199)
HDL: 65 mg/dL (ref >39)
LDL Chol Calc (NIH): 87 mg/dL (ref 0–99)
Triglycerides: 70 mg/dL (ref 0–149)
VLDL Cholesterol Cal: 13 mg/dL (ref 5–40)

## 2022-04-13 LAB — TSH: TSH: 2.46 u[IU]/mL (ref 0.450–4.500)

## 2022-04-19 ENCOUNTER — Other Ambulatory Visit: Payer: Self-pay | Admitting: Family Medicine

## 2022-04-27 DIAGNOSIS — H401232 Low-tension glaucoma, bilateral, moderate stage: Secondary | ICD-10-CM | POA: Diagnosis not present

## 2022-06-05 ENCOUNTER — Encounter: Payer: Self-pay | Admitting: Internal Medicine

## 2022-06-05 ENCOUNTER — Ambulatory Visit (INDEPENDENT_AMBULATORY_CARE_PROVIDER_SITE_OTHER): Payer: Medicare HMO | Admitting: Internal Medicine

## 2022-06-05 DIAGNOSIS — M25562 Pain in left knee: Secondary | ICD-10-CM

## 2022-06-05 DIAGNOSIS — G8929 Other chronic pain: Secondary | ICD-10-CM

## 2022-06-05 NOTE — Assessment & Plan Note (Addendum)
Chronic pain in left knee. She reports improvement in knee after PT sessions this year. Two weeks ago her knee pain increased. No trauma to the knee at this time.   Assessment/Plan: Review of Knee xray completed 2 years ago shows history of severe tricompartmental degenerative joint disease with marked bone on bone laterally. No MRI. No plan for surgery. Steroid injection discussed and patient would like to proceed with injection today.   Procedure Note  Indication:  Pain  Operators: Tamsen Snider, MD  The patient was provided with risks, benefits, and alternatives to intraarticular injection. She consented to intraarticular knee injection for left knee pain.  After a time out was preformed, the knee was prepped in a sterile fashion. Cold spray was applied to the skin over the insertion site (lateral inferior patellar aspect of the knee while in the seated position). A mixture of 4 cc 1% lidocaine and 40 mg of Kenalog was injected into the knee using a 23 gauge needle. The left knee space was entered without difficulty, entered successfully after one attempt. The patient tolerated the procedures well without complication.

## 2022-06-05 NOTE — Progress Notes (Unsigned)
     CC: Joint Swelling (Reports left knee pain/ swelling, has arthritis in her joints and is dealing with a flare up. )    HPI:Ms.Lisa Klein is a 88 y.o. female who presents for evaluation of knee pain. For the details of today's visit, please refer to the assessment and plan.  Past Medical History:  Diagnosis Date  . Cancer ([place]) 2012   LEFT BREAST  . Clotting disorder ([place]) 05/2011   dvt, right leg  . Degenerative disc disease    with nerve compression   . DJD (degenerative joint disease)   . Hypertension   . Hypertension   . Hypothyroidism   . Hypothyroidism   . Long term current use of anticoagulant therapy 12/07/2014   Pulmonary embolus   . Obesity   . Osteopenia 08/24/2014     Physical Exam: Vitals:   06/05/22 1342  BP: 128/82  Pulse: 100  SpO2: 100%  Weight: 178 lb (80.7 kg)  Height: '5\' 4"'$  (1.626 m)   Physical Exam Musculoskeletal:     Left knee: Swelling present. No deformity, effusion, erythema, ecchymosis or bony tenderness. Normal range of motion (Pain with extension).     Instability Tests: Anterior drawer test negative. Posterior drawer test negative. Anterior Lachman test negative. Medial McMurray test negative and lateral McMurray test negative.        Assessment & Plan:   No problem-specific Assessment & Plan notes found for this encounter.    Lorene Dy, MD

## 2022-06-05 NOTE — Patient Instructions (Signed)
Thank you for trusting me with your care. To recap, today we discussed the following:    You received a knee injection today for chronic left knee pain.

## 2022-07-17 ENCOUNTER — Ambulatory Visit (INDEPENDENT_AMBULATORY_CARE_PROVIDER_SITE_OTHER): Payer: Medicare HMO

## 2022-07-17 DIAGNOSIS — Z0000 Encounter for general adult medical examination without abnormal findings: Secondary | ICD-10-CM

## 2022-07-17 NOTE — Progress Notes (Signed)
Subjective:   Lisa Klein is a 88 y.o. female who presents for Medicare Annual (Subsequent) preventive examination.  I connected with  Lisa Klein on 07/17/22 by a audio enabled telemedicine application and verified that I am speaking with the correct person using two identifiers.  Patient Location: Home  Provider Location: Office/Clinic  I discussed the limitations of evaluation and management by telemedicine. The patient expressed understanding and agreed to proceed.   Review of Systems     Lisa Klein , Thank you for taking time to come for your Medicare Wellness Visit. I appreciate your ongoing commitment to your health goals. Please review the following plan we discussed and let me know if I can assist you in the future.   These are the goals we discussed:  Goals      Exercise 3x per week (30 min per time)     Starting today 07/25/2016 patient would like to increase exercise to at least 30 minutes 3 times a week.      LIFESTYLE - DECREASE FALLS RISK     Patient Stated     Would like to stay active to be able to keep doing for herself         This is a list of the screening recommended for you and due dates:  Health Maintenance  Topic Date Due   COVID-19 Vaccine (3 - Moderna risk series) 12/01/2019   DTaP/Tdap/Td vaccine (3 - Td or Tdap) 11/12/2020   Medicare Annual Wellness Visit  07/18/2023   Pneumonia Vaccine  Completed   Flu Shot  Completed   DEXA scan (bone density measurement)  Completed   Zoster (Shingles) Vaccine  Completed   HPV Vaccine  Aged Out          Objective:    There were no vitals filed for this visit. There is no height or weight on file to calculate BMI.     03/22/2022    1:29 PM 01/12/2022   11:47 AM 09/21/2021    1:15 PM 03/20/2021    2:06 PM 09/16/2020   12:03 PM 06/16/2020    8:44 AM 03/11/2020   10:50 AM  Advanced Directives  Does Patient Have a Medical Advance Directive? No No No No No No No  Would patient like  information on creating a medical advance directive? No - Patient declined Yes (MAU/Ambulatory/Procedural Areas - Information given) No - Patient declined No - Patient declined No - Patient declined Yes (MAU/Ambulatory/Procedural Areas - Information given) No - Patient declined    Current Medications (verified) Outpatient Encounter Medications as of 07/17/2022  Medication Sig   acetaminophen (TYLENOL) 650 MG CR tablet Take 650 mg by mouth 2 (two) times daily.   Biotin w/ Vitamins C & E (HAIR/SKIN/NAILS PO) Take by mouth.   calcium-vitamin D (OSCAL WITH D) 500-200 MG-UNIT tablet Take 1 tablet by mouth 2 (two) times daily.   clotrimazole-betamethasone (LOTRISONE) cream Apply 1 application. topically 2 (two) times daily.   Cod Liver Oil 1000 MG CAPS Take 1 capsule by mouth daily.    denosumab (PROLIA) 60 MG/ML SOLN injection Inject 60 mg into the skin every 6 (six) months. Administer in upper arm, thigh, or abdomen   docusate sodium (COLACE) 100 MG capsule Take 100 mg by mouth daily as needed for constipation. For constipation   hydrochlorothiazide (MICROZIDE) 12.5 MG capsule TAKE 1 CAPSULE EVERY MORNING   latanoprost (XALATAN) 0.005 % ophthalmic solution SMARTSIG:In Eye(s)   levothyroxine (SYNTHROID) 100 MCG tablet  TAKE 1 TABLET EVERY MORNING   lovastatin (MEVACOR) 20 MG tablet TAKE 1 TABLET (20 MG TOTAL) BY MOUTH AT BEDTIME.   pantoprazole (PROTONIX) 20 MG tablet TAKE 1 TABLET EVERY DAY   potassium chloride (KLOR-CON M) 10 MEQ tablet TAKE 1 TABLET TWICE DAILY   vitamin E 1000 UNIT capsule Take 1,000 Units by mouth daily.   XARELTO 20 MG TABS tablet TAKE 1 TABLET EVERY DAY WITH SUPPER   Facility-Administered Encounter Medications as of 07/17/2022  Medication   heparin lock flush 100 unit/mL   heparin lock flush 100 unit/mL   sodium chloride 0.9 % injection 10 mL   sodium chloride 0.9 % injection 10 mL    Allergies (verified) Listerine [antiseptic mouth rinse] and Singulair [montelukast  sodium]   History: Past Medical History:  Diagnosis Date   Cancer ([place]) 2012   LEFT BREAST   Clotting disorder ([place]) 05/2011   dvt, right leg   Degenerative disc disease    with nerve compression    DJD (degenerative joint disease)    Hypertension    Hypertension    Hypothyroidism    Hypothyroidism    Long term current use of anticoagulant therapy 12/07/2014   Pulmonary embolus    Obesity    Osteopenia 08/24/2014   Past Surgical History:  Procedure Laterality Date   ABDOMINAL HYSTERECTOMY  1980   fibroids   BREAST SURGERY  2012   left total mastectomy   CHOLECYSTECTOMY  80'S   [facility]   CHOLECYSTECTOMY     HIP PINNING,CANNULATED Left 01/19/2013   Procedure: CANNULATED HIP PINNING-  left;  Surgeon: Marin Shutter, MD;  Location: [facility];  Service: Orthopedics;  Laterality: Left;   MASTECTOMY MODIFIED RADICAL  02/14/11   left   PORT-A-CATH REMOVAL N/A 12/05/2012   Procedure: MINOR REMOVAL PORT-A-CATH;  Surgeon: Jamesetta So, MD;  Location: [facility];  Service: General;  Laterality: N/A;  In Minor Room   PORTACATH PLACEMENT  04/16/2011   Procedure: INSERTION PORT-A-CATH;  Surgeon: Jamesetta So;  Location: [facility];  Service: General;  Laterality: Right;  right subclavian   VESICOVAGINAL FISTULA CLOSURE W/ TAH     [facility]   Family History  Problem Relation Age of Onset   COPD Father    Lung disease Father    Hypertension Sister    Arthritis Mother    Hypertension Sister    Heart disease Son        stent   Anesthesia problems Neg Hx    Malignant hyperthermia Neg Hx    Hypotension Neg Hx    Pseudochol deficiency Neg Hx    Social History   Socioeconomic History   Marital status: Widowed    Spouse name: Not on file   Number of children: 2   Years of education: 78   Highest education level: 12th grade  Occupational History   Occupation: [employer] part time    Occupation: retired/disabled   Tobacco Use   Smoking status: Never   Smokeless tobacco: Never  Substance and Sexual Activity    Alcohol use: No   Drug use: No   Sexual activity: Not Currently    Birth control/protection: Post-menopausal  Other Topics Concern   Not on file  Social History Narrative   Reports starting to have a lot of trouble doing things for herself. Lives alone, tries to drive to places, but it is getting increasingly harder to do.    Social Determinants of Health   Financial Resource Strain: Low  Risk  (07/05/2021)   Overall Financial Resource Strain (CARDIA)    Difficulty of Paying Living Expenses: Not very hard  Food Insecurity: No Food Insecurity (07/05/2021)   Hunger Vital Sign    Worried About Running Out of Food in the Last Year: Never true    Ran Out of Food in the Last Year: Never true  Transportation Needs: No Transportation Needs (07/05/2021)   PRAPARE - Hydrologist (Medical): No    Lack of Transportation (Non-Medical): No  Physical Activity: Insufficiently Active (06/16/2020)   Exercise Vital Sign    Days of Exercise per Week: 5 days    Minutes of Exercise per Session: 10 min  Stress: No Stress Concern Present (07/05/2021)   [place]    Feeling of Stress : Not at all  Social Connections: Moderately Isolated (07/05/2021)   Social Connection and Isolation Panel [NHANES]    Frequency of Communication with Friends and Family: Twice a week    Frequency of Social Gatherings with Friends and Family: Twice a week    Attends Religious Services: More than 4 times per year    Active Member of [employer] or Organizations: No    Attends Archivist Meetings: Never    Marital Status: Widowed    Tobacco Counseling Counseling given: Not Answered   Clinical Intake:                 Diabetic? No         Activities of Daily Living     No data to display           Patient Care Team: [name], MD as PCP - General Twana First, MD as Consulting  Physician (Oncology) Sheldon Silvan, Manon Hilding, PA-C (Inactive) as Physician Assistant (Oncology)  Indicate any recent Medical Services you may have received from other than [facility] providers in the past year (date may be approximate).     Assessment:   This is a routine wellness examination for Lisa Klein.  Hearing/Vision screen No results found.  Dietary issues and exercise activities discussed:     Goals Addressed   None   Depression Screen    06/05/2022    1:47 PM 04/12/2022    1:29 PM 12/19/2021    1:27 PM 03/22/2021   10:02 AM 08/17/2020   10:07 AM 06/16/2020    8:45 AM 06/16/2020    8:40 AM  PHQ 2/9 Scores  PHQ - 2 Score 0 0 0 0 0 0 0    Fall Risk    06/05/2022    1:47 PM 04/12/2022    1:29 PM 12/19/2021    1:26 PM 07/26/2021   10:32 AM 07/05/2021    4:28 PM  [place] in the past year? 0 0 0 0 0  Number falls in past yr: 0 0 0 0 0  Injury with Fall? 0 0 0 0 0  Risk for fall due to : No Fall Risks Impaired mobility No Fall Risks    Follow up Falls evaluation completed  Falls evaluation completed      [place]:  Any stairs in or around the home? No  If so, are there any without handrails?  NA Home free of loose throw rugs in walkways, pet beds, electrical cords, etc? Yes  Adequate lighting in your home to reduce risk of falls? Yes   ASSISTIVE DEVICES UTILIZED TO  PREVENT FALLS:  Life alert? No  Use of a cane, walker or w/c? Yes  Grab bars in the bathroom? Yes  Shower chair or bench in shower? No  Elevated toilet seat or a handicapped toilet? No     Cognitive Function:    06/16/2020    8:46 AM  MMSE - Mini Mental State Exam  Not completed: Unable to complete        07/05/2021    4:37 PM 06/16/2020    8:46 AM 06/16/2019    8:18 AM 06/12/2018    1:51 PM 05/29/2017    1:40 PM  6CIT Screen  What Year? 0 points 0 points 0 points 0 points 0 points  What month? 0 points 0 points 0 points 0 points 0 points  What time? 0  points 0 points 0 points 0 points 0 points  Count back from 20 0 points 0 points 0 points 0 points 0 points  Months in reverse 0 points 0 points 0 points 0 points 0 points  Repeat phrase 0 points 0 points 0 points 0 points 0 points  Total Score 0 points 0 points 0 points 0 points 0 points    Immunizations Immunization History  Administered Date(s) Administered   Fluad Quad(high Dose 65+) 06/11/2019, 07/07/2021, 04/12/2022   Influenza Whole 06/06/2009, 05/17/2010   Influenza,inj,Quad PF,6+ Mos 06/29/2013, 07/15/2014, 05/02/2015, 07/25/2016, 04/18/2017, 04/02/2018   Influenza-Unspecified 04/26/2020   Moderna Sars-Covid-2 Vaccination 10/06/2019, 11/03/2019   Pneumococcal Conjugate-13 03/03/2014   Pneumococcal Polysaccharide-23 08/07/2001   Td 02/29/2004   Tdap 11/13/2010   Zoster Recombinat (Shingrix) 11/08/2021, 02/08/2022   Zoster, Live 10/07/2006    TDAP status: Due, Education has been provided regarding the importance of this vaccine. Advised may receive this vaccine at local pharmacy or Health Dept. Aware to provide a copy of the vaccination record if obtained from local pharmacy or Health Dept. Verbalized acceptance and understanding.  Flu Vaccine status: Up to date  Pneumococcal vaccine status: Up to date  Covid-19 vaccine status: Completed vaccines  Qualifies for Shingles Vaccine? Yes   Zostavax completed Yes   Shingrix Completed?: Yes  Screening Tests Health Maintenance  Topic Date Due   COVID-19 Vaccine (3 - Moderna risk series) 12/01/2019   DTaP/Tdap/Td (3 - Td or Tdap) 11/12/2020   Medicare Annual Wellness (AWV)  07/18/2023   Pneumonia Vaccine 54+ Years old  Completed   INFLUENZA VACCINE  Completed   DEXA SCAN  Completed   Zoster Vaccines- Shingrix  Completed   HPV VACCINES  Aged Out    Health Maintenance  Health Maintenance Due  Topic Date Due   COVID-19 Vaccine (3 - Moderna risk series) 12/01/2019   DTaP/Tdap/Td (3 - Td or Tdap) 11/12/2020     Colorectal cancer screening: No longer required.   Mammogram status: No longer required due to age.  Bone Density status: Completed 08/15/2020. Results reflect: Bone density results: OSTEOPENIA. Repeat every 2 years.  Lung Cancer Screening: (Low Dose CT Chest recommended if Age 68-80 years, 30 pack-year currently smoking OR have quit w/in 15years.) does not qualify.   Lung Cancer Screening Referral: NA  Additional Screening:  Hepatitis C Screening: does not qualify; Completed   Vision Screening: Recommended annual ophthalmology exams for early detection of glaucoma and other disorders of the eye. Is the patient up to date with their annual eye exam?  Yes  Who is the provider or what is the name of the office in which the patient attends annual eye exams? [facility]  Dental Screening: Recommended annual dental exams for proper oral hygiene  Community Resource Referral / Chronic Care Management: CRR required this visit?  No   CCM required this visit?  No      Plan:     I have personally reviewed and noted the following in the patient's chart:   Medical and social history Use of alcohol, tobacco or illicit drugs  Current medications and supplements including opioid prescriptions. Patient is not currently taking opioid prescriptions. Functional ability and status Nutritional status Physical activity Advanced directives List of other physicians Hospitalizations, surgeries, and ER visits in previous 12 months Vitals Screenings to include cognitive, depression, and falls Referrals and appointments  In addition, I have reviewed and discussed with patient certain preventive protocols, quality metrics, and best practice recommendations. A written personalized care plan for preventive services as well as general preventive health recommendations were provided to patient.     Johny Drilling, [place]   07/17/2022   Nurse Notes:  Lisa Klein , Thank you for taking time  to come for your Medicare Wellness Visit. I appreciate your ongoing commitment to your health goals. Please review the following plan we discussed and let me know if I can assist you in the future.   These are the goals we discussed:  Goals      Exercise 3x per week (30 min per time)     Starting today 07/25/2016 patient would like to increase exercise to at least 30 minutes 3 times a week.      LIFESTYLE - DECREASE FALLS RISK     Patient Stated     Would like to stay active to be able to keep doing for herself         This is a list of the screening recommended for you and due dates:  Health Maintenance  Topic Date Due   COVID-19 Vaccine (3 - Moderna risk series) 12/01/2019   DTaP/Tdap/Td vaccine (3 - Td or Tdap) 11/12/2020   Medicare Annual Wellness Visit  07/18/2023   Pneumonia Vaccine  Completed   Flu Shot  Completed   DEXA scan (bone density measurement)  Completed   Zoster (Shingles) Vaccine  Completed   HPV Vaccine  Aged Out

## 2022-07-17 NOTE — Patient Instructions (Signed)
Lisa Klein , Thank you for taking time to come for your Medicare Wellness Visit. I appreciate your ongoing commitment to your health goals. Please review the following plan we discussed and let me know if I can assist you in the future.   Screening recommendations/referrals: Bone Density: Completed Recommended yearly ophthalmology/optometry visit for glaucoma screening and checkup Recommended yearly dental visit for hygiene and checkup  Vaccinations: Influenza vaccine: Completed Pneumococcal vaccine: Completed Tdap vaccine: Completed Shingles vaccine: Completed    Advanced directives: patient declined  Conditions/risks identified: falls, hypertension  Next appointment: 1 year   Preventive Care 65 Years and Older, Female Preventive care refers to lifestyle choices and visits with your health care provider that can promote health and wellness. What does preventive care include? A yearly physical exam. This is also called an annual well check. Dental exams once or twice a year. Routine eye exams. Ask your health care provider how often you should have your eyes checked. Personal lifestyle choices, including: Daily care of your teeth and gums. Regular physical activity. Eating a healthy diet. Avoiding tobacco and drug use. Limiting alcohol use. Practicing safe sex. Taking low-dose aspirin every day. Taking vitamin and mineral supplements as recommended by your health care provider. What happens during an annual well check? The services and screenings done by your health care provider during your annual well check will depend on your age, overall health, lifestyle risk factors, and family history of disease. Counseling  Your health care provider may ask you questions about your: Alcohol use. Tobacco use. Drug use. Emotional well-being. Home and relationship well-being. Sexual activity. Eating habits. History of falls. Memory and ability to understand (cognition). Work  and work Statistician. Reproductive health. Screening  You may have the following tests or measurements: Height, weight, and BMI. Blood pressure. Lipid and cholesterol levels. These may be checked every 5 years, or more frequently if you are over 53 years old. Skin check. Lung cancer screening. You may have this screening every year starting at age 36 if you have a 30-pack-year history of smoking and currently smoke or have quit within the past 15 years. Fecal occult blood test (FOBT) of the stool. You may have this test every year starting at age 75. Flexible sigmoidoscopy or colonoscopy. You may have a sigmoidoscopy every 5 years or a colonoscopy every 10 years starting at age 14. Hepatitis C blood test. Hepatitis B blood test. Sexually transmitted disease (STD) testing. Diabetes screening. This is done by checking your blood sugar (glucose) after you have not eaten for a while (fasting). You may have this done every 1-3 years. Bone density scan. This is done to screen for osteoporosis. You may have this done starting at age 39. Mammogram. This may be done every 1-2 years. Talk to your health care provider about how often you should have regular mammograms. Talk with your health care provider about your test results, treatment options, and if necessary, the need for more tests. Vaccines  Your health care provider may recommend certain vaccines, such as: Influenza vaccine. This is recommended every year. Tetanus, diphtheria, and acellular pertussis (Tdap, Td) vaccine. You may need a Td booster every 10 years. Zoster vaccine. You may need this after age 13. Pneumococcal 13-valent conjugate (PCV13) vaccine. One dose is recommended after age 60. Pneumococcal polysaccharide (PPSV23) vaccine. One dose is recommended after age 61. Talk to your health care provider about which screenings and vaccines you need and how often you need them. This information is not intended  to replace advice given to  you by your health care provider. Make sure you discuss any questions you have with your health care provider. Document Released: 08/19/2015 Document Revised: 04/11/2016 Document Reviewed: 05/24/2015 Elsevier Interactive Patient Education  2017 [place] Prevention in the Home Falls can cause injuries. They can happen to people of all ages. There are many things you can do to make your home safe and to help prevent falls. What can I do on the outside of my home? Regularly fix the edges of walkways and driveways and fix any cracks. Remove anything that might make you trip as you walk through a door, such as a raised step or threshold. Trim any bushes or trees on the path to your home. Use bright outdoor lighting. Clear any walking paths of anything that might make someone trip, such as rocks or tools. Regularly check to see if handrails are loose or broken. Make sure that both sides of any steps have handrails. Any raised decks and porches should have guardrails on the edges. Have any leaves, snow, or ice cleared regularly. Use sand or salt on walking paths during winter. Clean up any spills in your garage right away. This includes oil or grease spills. What can I do in the bathroom? Use night lights. Install grab bars by the toilet and in the tub and shower. Do not use towel bars as grab bars. Use non-skid mats or decals in the tub or shower. If you need to sit down in the shower, use a plastic, non-slip stool. Keep the floor dry. Clean up any water that spills on the floor as soon as it happens. Remove soap buildup in the tub or shower regularly. Attach bath mats securely with double-sided non-slip rug tape. Do not have throw rugs and other things on the floor that can make you trip. What can I do in the bedroom? Use night lights. Make sure that you have a light by your bed that is easy to reach. Do not use any sheets or blankets that are too big for your bed. They should not  hang down onto the floor. Have a firm chair that has side arms. You can use this for support while you get dressed. Do not have throw rugs and other things on the floor that can make you trip. What can I do in the kitchen? Clean up any spills right away. Avoid walking on wet floors. Keep items that you use a lot in easy-to-reach places. If you need to reach something above you, use a strong step stool that has a grab bar. Keep electrical cords out of the way. Do not use floor polish or wax that makes floors slippery. If you must use wax, use non-skid floor wax. Do not have throw rugs and other things on the floor that can make you trip. What can I do with my stairs? Do not leave any items on the stairs. Make sure that there are handrails on both sides of the stairs and use them. Fix handrails that are broken or loose. Make sure that handrails are as long as the stairways. Check any carpeting to make sure that it is firmly attached to the stairs. Fix any carpet that is loose or worn. Avoid having throw rugs at the top or bottom of the stairs. If you do have throw rugs, attach them to the floor with carpet tape. Make sure that you have a light switch at the top of the stairs and  the bottom of the stairs. If you do not have them, ask someone to add them for you. What else can I do to help prevent falls? Wear shoes that: Do not have high heels. Have rubber bottoms. Are comfortable and fit you well. Are closed at the toe. Do not wear sandals. If you use a stepladder: Make sure that it is fully opened. Do not climb a closed stepladder. Make sure that both sides of the stepladder are locked into place. Ask someone to hold it for you, if possible. Clearly mark and make sure that you can see: Any grab bars or handrails. First and last steps. Where the edge of each step is. Use tools that help you move around (mobility aids) if they are needed. These  include: Canes. Walkers. Scooters. Crutches. Turn on the lights when you go into a dark area. Replace any light bulbs as soon as they burn out. Set up your furniture so you have a clear path. Avoid moving your furniture around. If any of your floors are uneven, fix them. If there are any pets around you, be aware of where they are. Review your medicines with your doctor. Some medicines can make you feel dizzy. This can increase your chance of falling. Ask your doctor what other things that you can do to help prevent falls. This information is not intended to replace advice given to you by your health care provider. Make sure you discuss any questions you have with your health care provider. Document Released: 05/19/2009 Document Revised: 12/29/2015 Document Reviewed: 08/27/2014 Elsevier Interactive Patient Education  2017 [employer].

## 2022-07-19 ENCOUNTER — Other Ambulatory Visit: Payer: Self-pay | Admitting: Family Medicine

## 2022-08-22 ENCOUNTER — Ambulatory Visit (HOSPITAL_COMMUNITY)
Admission: RE | Admit: 2022-08-22 | Discharge: 2022-08-22 | Disposition: A | Discharge status: Home / Self Care | Payer: Medicare HMO | Source: Ambulatory Visit | Attending: Physician Assistant | Admitting: Physician Assistant

## 2022-08-22 DIAGNOSIS — Z780 Asymptomatic menopausal state: Secondary | ICD-10-CM | POA: Insufficient documentation

## 2022-08-22 DIAGNOSIS — Z13820 Encounter for screening for osteoporosis: Secondary | ICD-10-CM | POA: Insufficient documentation

## 2022-08-22 DIAGNOSIS — C50912 Malignant neoplasm of unspecified site of left female breast: Secondary | ICD-10-CM | POA: Diagnosis not present

## 2022-08-22 DIAGNOSIS — Z1231 Encounter for screening mammogram for malignant neoplasm of breast: Secondary | ICD-10-CM | POA: Insufficient documentation

## 2022-08-22 DIAGNOSIS — Z853 Personal history of malignant neoplasm of breast: Secondary | ICD-10-CM | POA: Insufficient documentation

## 2022-08-22 DIAGNOSIS — M8580 Other specified disorders of bone density and structure, unspecified site: Secondary | ICD-10-CM

## 2022-09-18 ENCOUNTER — Inpatient Hospital Stay: Payer: Medicare HMO | Attending: Hematology

## 2022-09-18 ENCOUNTER — Inpatient Hospital Stay: Payer: Medicare HMO

## 2022-09-18 ENCOUNTER — Inpatient Hospital Stay (HOSPITAL_BASED_OUTPATIENT_CLINIC_OR_DEPARTMENT_OTHER): Payer: Medicare HMO | Admitting: Hematology

## 2022-09-18 ENCOUNTER — Ambulatory Visit: Payer: Medicare HMO | Admitting: Physician Assistant

## 2022-09-18 VITALS — BP 127/69 | HR 42 | Temp 97.6°F | Resp 16 | Wt 178.2 lb

## 2022-09-18 DIAGNOSIS — Z86718 Personal history of other venous thrombosis and embolism: Secondary | ICD-10-CM | POA: Diagnosis not present

## 2022-09-18 DIAGNOSIS — Z8261 Family history of arthritis: Secondary | ICD-10-CM | POA: Diagnosis not present

## 2022-09-18 DIAGNOSIS — R350 Frequency of micturition: Secondary | ICD-10-CM | POA: Insufficient documentation

## 2022-09-18 DIAGNOSIS — M8580 Other specified disorders of bone density and structure, unspecified site: Secondary | ICD-10-CM | POA: Diagnosis not present

## 2022-09-18 DIAGNOSIS — Z7982 Long term (current) use of aspirin: Secondary | ICD-10-CM | POA: Insufficient documentation

## 2022-09-18 DIAGNOSIS — C50912 Malignant neoplasm of unspecified site of left female breast: Secondary | ICD-10-CM

## 2022-09-18 DIAGNOSIS — Z923 Personal history of irradiation: Secondary | ICD-10-CM | POA: Diagnosis not present

## 2022-09-18 DIAGNOSIS — D72819 Decreased white blood cell count, unspecified: Secondary | ICD-10-CM | POA: Insufficient documentation

## 2022-09-18 DIAGNOSIS — Z79899 Other long term (current) drug therapy: Secondary | ICD-10-CM | POA: Diagnosis not present

## 2022-09-18 DIAGNOSIS — Z7901 Long term (current) use of anticoagulants: Secondary | ICD-10-CM

## 2022-09-18 DIAGNOSIS — E559 Vitamin D deficiency, unspecified: Secondary | ICD-10-CM

## 2022-09-18 DIAGNOSIS — Z86711 Personal history of pulmonary embolism: Secondary | ICD-10-CM | POA: Insufficient documentation

## 2022-09-18 DIAGNOSIS — R0602 Shortness of breath: Secondary | ICD-10-CM | POA: Insufficient documentation

## 2022-09-18 DIAGNOSIS — Z8249 Family history of ischemic heart disease and other diseases of the circulatory system: Secondary | ICD-10-CM | POA: Insufficient documentation

## 2022-09-18 DIAGNOSIS — Z79811 Long term (current) use of aromatase inhibitors: Secondary | ICD-10-CM | POA: Insufficient documentation

## 2022-09-18 DIAGNOSIS — Z1231 Encounter for screening mammogram for malignant neoplasm of breast: Secondary | ICD-10-CM

## 2022-09-18 DIAGNOSIS — Z9012 Acquired absence of left breast and nipple: Secondary | ICD-10-CM | POA: Insufficient documentation

## 2022-09-18 DIAGNOSIS — Z170 Estrogen receptor positive status [ER+]: Secondary | ICD-10-CM | POA: Diagnosis not present

## 2022-09-18 LAB — CBC WITH DIFFERENTIAL/PLATELET
Abs Immature Granulocytes: 0 10*3/uL (ref 0.00–0.07)
Band Neutrophils: 1 %
Basophils Absolute: 0 10*3/uL (ref 0.0–0.1)
Basophils Relative: 1 %
Eosinophils Absolute: 0.1 10*3/uL (ref 0.0–0.5)
Eosinophils Relative: 3 %
HCT: 40.1 % (ref 36.0–46.0)
Hemoglobin: 12.9 g/dL (ref 12.0–15.0)
Lymphocytes Relative: 41 %
Lymphs Abs: 1.5 10*3/uL (ref 0.7–4.0)
MCH: 29.6 pg (ref 26.0–34.0)
MCHC: 32.2 g/dL (ref 30.0–36.0)
MCV: 92 fL (ref 80.0–100.0)
Monocytes Absolute: 0.3 10*3/uL (ref 0.1–1.0)
Monocytes Relative: 8 %
Neutro Abs: 1.7 10*3/uL (ref 1.7–7.7)
Neutrophils Relative %: 46 %
Platelets: 158 10*3/uL (ref 150–400)
RBC: 4.36 MIL/uL (ref 3.87–5.11)
RDW: 15 % (ref 11.5–15.5)
WBC: 3.6 10*3/uL — ABNORMAL LOW (ref 4.0–10.5)
nRBC: 0 % (ref 0.0–0.2)

## 2022-09-18 LAB — COMPREHENSIVE METABOLIC PANEL
ALT: 12 U/L (ref 0–44)
AST: 20 U/L (ref 15–41)
Albumin: 3.7 g/dL (ref 3.5–5.0)
Alkaline Phosphatase: 33 U/L — ABNORMAL LOW (ref 38–126)
Anion gap: 7 (ref 5–15)
BUN: 15 mg/dL (ref 8–23)
CO2: 26 mmol/L (ref 22–32)
Calcium: 9.3 mg/dL (ref 8.9–10.3)
Chloride: 106 mmol/L (ref 98–111)
Creatinine, Ser: 0.89 mg/dL (ref 0.44–1.00)
GFR, Estimated: >60 mL/min (ref >60)
Glucose, Bld: 101 mg/dL — ABNORMAL HIGH (ref 70–99)
Potassium: 3.8 mmol/L (ref 3.5–5.1)
Sodium: 139 mmol/L (ref 135–145)
Total Bilirubin: 0.6 mg/dL (ref 0.3–1.2)
Total Protein: 6.7 g/dL (ref 6.5–8.1)

## 2022-09-18 LAB — LACTATE DEHYDROGENASE: LDH: 155 U/L (ref 98–192)

## 2022-09-18 MED ORDER — SODIUM CHLORIDE 0.9 % IV SOLN: Freq: Once | INTRAVENOUS | Status: AC

## 2022-09-18 MED ORDER — ZOLEDRONIC ACID 4 MG/100ML IV SOLN
4.0000 mg | Freq: Once | INTRAVENOUS | Status: AC
  Administered 2022-09-18: 4 mg via INTRAVENOUS
  Filled 2022-09-18: qty 100

## 2022-09-18 NOTE — Patient Instructions (Signed)
[place]  Discharge Instructions: Thank you for choosing [facility] to provide your oncology and hematology care.  If you have a lab appointment with the [facility], please come in thru the Main Entrance and check in at the main information desk.  Wear comfortable clothing and clothing appropriate for easy access to any Portacath or PICC line.   We strive to give you quality time with your provider. You may need to reschedule your appointment if you arrive late (15 or more minutes).  Arriving late affects you and other patients whose appointments are after yours.  Also, if you miss three or more appointments without notifying the office, you may be dismissed from the clinic at the provider's discretion.      For prescription refill requests, have your pharmacy contact our office and allow 72 hours for refills to be completed.    Today you received the following Z[place], return as scheduled.   To help prevent nausea and vomiting after your treatment, we encourage you to take your nausea medication as directed.  BELOW ARE SYMPTOMS THAT SHOULD BE REPORTED IMMEDIATELY: *FEVER GREATER THAN 100.4 F (38 C) OR HIGHER *CHILLS OR SWEATING *NAUSEA AND VOMITING THAT IS NOT CONTROLLED WITH YOUR NAUSEA MEDICATION *UNUSUAL SHORTNESS OF BREATH *UNUSUAL BRUISING OR BLEEDING *URINARY PROBLEMS (pain or burning when urinating, or frequent urination) *BOWEL PROBLEMS (unusual diarrhea, constipation, pain near the anus) TENDERNESS IN MOUTH AND THROAT WITH OR WITHOUT PRESENCE OF ULCERS (sore throat, sores in mouth, or a toothache) UNUSUAL RASH, SWELLING OR PAIN  UNUSUAL VAGINAL DISCHARGE OR ITCHING   Items with * indicate a potential emergency and should be followed up as soon as possible or go to the Emergency Department if any problems should occur.  Please show the CHEMOTHERAPY ALERT CARD or IMMUNOTHERAPY ALERT CARD at check-in to the Emergency Department and triage  nurse.  Should you have questions after your visit or need to cancel or reschedule your appointment, please contact [facility] [phone]  and follow the prompts.  Office hours are 8:00 a.m. to 4:30 p.m. Monday - Friday. Please note that voicemails left after 4:00 p.m. may not be returned until the following business day.  We are closed weekends and major holidays. You have access to a nurse at all times for urgent questions. Please call the main number to the clinic [phone] and follow the prompts.  For any non-urgent questions, you may also contact your provider using MyChart. We now offer e-Visits for anyone 71 and older to request care online for non-urgent symptoms. For details visit [email or web address].   Also download the MyChart app! Go to the app store, search "MyChart", open the app, select [facility], and log in with your MyChart username and password.

## 2022-09-18 NOTE — Patient Instructions (Addendum)
[place] at [facility] Discharge Instructions   You were seen and examined today by Dr. Delton Coombes.  He reviewed the results of your lab work which are normal/stable.  We will give you the Prolia injection today.   Return as scheduled in 6 months.    Thank you for choosing [facility] at [facility] to provide your oncology and hematology care.  To afford each patient quality time with our provider, please arrive at least 15 minutes before your scheduled appointment time.   If you have a lab appointment with the [facility] please come in thru the Main Entrance and check in at the main information desk.  You need to re-schedule your appointment should you arrive 10 or more minutes late.  We strive to give you quality time with our providers, and arriving late affects you and other patients whose appointments are after yours.  Also, if you no show three or more times for appointments you may be dismissed from the clinic at the providers discretion.     Again, thank you for choosing [facility].  Our hope is that these requests will decrease the amount of time that you wait before being seen by our physicians.       _____________________________________________________________  Should you have questions after your visit to [facility], please contact our office at [phone] and follow the prompts.  Our office hours are 8:00 a.m. and 4:30 p.m. Monday - Friday.  Please note that voicemails left after 4:00 p.m. may not be returned until the following business day.  We are closed weekends and major holidays.  You do have access to a nurse 24-7, just call the main number to the clinic [phone] and do not press any options, hold on the line and a nurse will answer the phone.    For prescription refill requests, have your pharmacy contact our office and allow 72 hours.    Due to Covid, you will need to wear a mask  upon entering the hospital. If you do not have a mask, a mask will be given to you at the Main Entrance upon arrival. For doctor visits, patients may have 1 support person age 3 or older with them. For treatment visits, patients can not have anyone with them due to social distancing guidelines and our immunocompromised population.

## 2022-09-18 NOTE — Progress Notes (Signed)
[facility] [address], [place] [address]    Clinic Day:  09/18/2022  Referring physician: [name], MD  Patient Care Team: [name], MD as PCP - Cleta Alberts, MD as Consulting Physician (Oncology) Sheldon Silvan, Manon Hilding, PA-C (Inactive) as Physician Assistant (Oncology)   ASSESSMENT & PLAN:   Assessment: 1.  Stage IIIa (T1 cN2 aM0) left breast IDC: -Status post left MRM on 02/14/2011, showing multifocal tumor, largest measuring 1.5 cm, grade 3, deep surgical margins positive, ER/PR positive, HER-2 negative, Ki-67 45%, 6 out of 7 lymph nodes positive.  Status post 6 cycles of epirubicin and Cytoxan, followed by radiation therapy - Anastrozole from 11/19/2011 through April 2023   2.  Osteopenia: -DEXA scan on 08/15/2020 with T-score of -1.3. - Prolia started on 09/01/2014.   3.  Thromboembolism: - She had a right leg DVT in 2012.  Bilateral pulmonary embolism was diagnosed in 2014.  She is currently on Xarelto without any bleeding issues.  Plan:  1.  Stage III left breast IDC, ER/PR positive: - Left mastectomy site is within normal limits. - Right breast mammogram on 08/22/2022, BI-RADS Category 1. - Labs today shows mild leukopenia which is stable with the rest of the CBC normal.  LFTs are normal. - Continue yearly mammograms.  2.  Osteopenia: - Calcium is 9.3.  She does not have any side effects.  Proceed with Prolia today with.  RTC 6 months for follow-up.  3.  Recurrent thromboembolism: - Continue Xarelto.  No bleeding issues.  Orders Placed This Encounter  Procedures   CBC with Differential    Standing Status:   Future    Standing Expiration Date:   09/18/2023   Comprehensive metabolic panel    Standing Status:   Future    Standing Expiration Date:   09/18/2023   Lactate dehydrogenase    Standing Status:   Future    Standing Expiration Date:   09/18/2023   VITAMIN D 25 Hydroxy (Vit-D Deficiency, Fractures)     Standing Status:   Future    Standing Expiration Date:   09/19/2023      Kaleen Odea as a scribe for Derek Jack, MD.,have documented all relevant documentation on the behalf of Derek Jack, MD,as directed by  Derek Jack, MD while in the presence of Derek Jack, MD.   I, Derek Jack MD, have reviewed the above documentation for accuracy and completeness, and I agree with the above.   Derek Jack, MD   2/13/20245:49 PM  CHIEF COMPLAINT:   Diagnosis:  infiltrating ductal carcinoma of the left breast    Cancer Staging  Infiltrating ductal carcinoma of breast ([place]) Staging form: Breast, AJCC 7th Edition - Clinical: Stage IIIA (T1c, N2a, cM0) - Signed by Baird Cancer, PA on 04/18/2011    Prior Therapy: Arimidex Current Therapy:  Arimidex     HISTORY OF PRESENT ILLNESS:   Oncology History   No history exists.     INTERVAL HISTORY:   Lisa Klein is a 89 y.o. female presenting to clinic today for follow up of  infiltrating ductal carcinoma of the left breast . She was last seen by me on 08/21/2018. She was last seen by PA [name] on 03/22/2022.   Today, she states that she is doing well overall. Her appetite level is at 100%. Her energy level is at 0%. She has 3/10 arthritis pain. Her arthritis is most bothersome in her knee, hip, and spine. She  is taking Xarelto without any issues. She doesn't hear well out of her left ear due to recent head cold symptoms. She takes calcium with vitamin D.   PAST MEDICAL HISTORY:   Past Medical History: Past Medical History:  Diagnosis Date   Cancer ([place]) 2012   LEFT BREAST   Clotting disorder ([place]) 05/2011   dvt, right leg   Degenerative disc disease    with nerve compression    DJD (degenerative joint disease)    Hypertension    Hypertension    Hypothyroidism    Hypothyroidism    Long term current use of anticoagulant therapy 12/07/2014   Pulmonary embolus     Obesity    Osteopenia 08/24/2014    Surgical History: Past Surgical History:  Procedure Laterality Date   ABDOMINAL HYSTERECTOMY  1980   fibroids   BREAST SURGERY  2012   left total mastectomy   CHOLECYSTECTOMY  80'S   [facility]   CHOLECYSTECTOMY     HIP PINNING,CANNULATED Left 01/19/2013   Procedure: CANNULATED HIP PINNING-  left;  Surgeon: Marin Shutter, MD;  Location: [facility];  Service: Orthopedics;  Laterality: Left;   MASTECTOMY MODIFIED RADICAL  02/14/11   left   PORT-A-CATH REMOVAL N/A 12/05/2012   Procedure: MINOR REMOVAL PORT-A-CATH;  Surgeon: Jamesetta So, MD;  Location: [facility];  Service: General;  Laterality: N/A;  In Minor Room   PORTACATH PLACEMENT  04/16/2011   Procedure: INSERTION PORT-A-CATH;  Surgeon: Jamesetta So;  Location: [facility];  Service: General;  Laterality: Right;  right subclavian   VESICOVAGINAL FISTULA CLOSURE W/ TAH     [facility]    Social History: Social History   Socioeconomic History   Marital status: Widowed    Spouse name: Not on file   Number of children: 2   Years of education: 31   Highest education level: 12th grade  Occupational History   Occupation: [employer] part time    Occupation: retired/disabled   Tobacco Use   Smoking status: Never   Smokeless tobacco: Never  Substance and Sexual Activity   Alcohol use: No   Drug use: No   Sexual activity: Not Currently    Birth control/protection: Post-menopausal  Other Topics Concern   Not on file  Social History Narrative   Reports starting to have a lot of trouble doing things for herself. Lives alone, tries to drive to places, but it is getting increasingly harder to do.    Social Determinants of Health   Financial Resource Strain: Low Risk  (07/17/2022)   Overall Financial Resource Strain (CARDIA)    Difficulty of Paying Living Expenses: Not very hard  Food Insecurity: No Food Insecurity (07/17/2022)   Hunger Vital Sign    Worried About Running Out of Food in the Last Year: Never true    Ran  Out of Food in the Last Year: Never true  Transportation Needs: No Transportation Needs (07/17/2022)   PRAPARE - Hydrologist (Medical): No    Lack of Transportation (Non-Medical): No  Physical Activity: Insufficiently Active (07/17/2022)   Exercise Vital Sign    Days of Exercise per Week: 5 days    Minutes of Exercise per Session: 10 min  Stress: No Stress Concern Present (07/17/2022)   [place]    Feeling of Stress : Not at all  Social Connections: Socially Isolated (07/17/2022)   Social Connection and Isolation Panel [NHANES]    Frequency  of Communication with Friends and Family: More than three times a week    Frequency of Social Gatherings with Friends and Family: Three times a week    Attends Religious Services: Never    Active Member of Clubs or Organizations: No    Attends Archivist Meetings: Never    Marital Status: Widowed  Intimate Partner Violence: Not At Risk (07/17/2022)   Humiliation, Afraid, Rape, and Kick questionnaire    Fear of Current or Ex-Partner: No    Emotionally Abused: No    Physically Abused: No    Sexually Abused: No    Family History: Family History  Problem Relation Age of Onset   COPD Father    Lung disease Father    Hypertension Sister    Arthritis Mother    Hypertension Sister    Heart disease Son        stent   Anesthesia problems Neg Hx    Malignant hyperthermia Neg Hx    Hypotension Neg Hx    Pseudochol deficiency Neg Hx     Current Medications:  Current Outpatient Medications:    acetaminophen (TYLENOL) 650 MG CR tablet, Take 650 mg by mouth 2 (two) times daily., Disp: , Rfl:    Biotin w/ Vitamins C & E (HAIR/SKIN/NAILS PO), Take by mouth., Disp: , Rfl:    calcium-vitamin D (OSCAL WITH D) 500-200 MG-UNIT tablet, Take 1 tablet by mouth 2 (two) times daily., Disp: , Rfl:    clotrimazole-betamethasone (LOTRISONE) cream, Apply 1  application. topically 2 (two) times daily., Disp: 45 g, Rfl: 1   Cod Liver Oil 1000 MG CAPS, Take 1 capsule by mouth daily. , Disp: , Rfl:    denosumab (PROLIA) 60 MG/ML SOLN injection, Inject 60 mg into the skin every 6 (six) months. Administer in upper arm, thigh, or abdomen, Disp: , Rfl:    docusate sodium (COLACE) 100 MG capsule, Take 100 mg by mouth daily as needed for constipation. For constipation, Disp: , Rfl:    hydrochlorothiazide (MICROZIDE) 12.5 MG capsule, TAKE 1 CAPSULE EVERY MORNING, Disp: 90 capsule, Rfl: 2   latanoprost (XALATAN) 0.005 % ophthalmic solution, SMARTSIG:In Eye(s), Disp: , Rfl:    levothyroxine (SYNTHROID) 100 MCG tablet, TAKE 1 TABLET EVERY MORNING, Disp: 90 tablet, Rfl: 0   lovastatin (MEVACOR) 20 MG tablet, TAKE 1 TABLET AT BEDTIME, Disp: 90 tablet, Rfl: 3   pantoprazole (PROTONIX) 20 MG tablet, TAKE 1 TABLET EVERY DAY, Disp: 90 tablet, Rfl: 1   potassium chloride (KLOR-CON M) 10 MEQ tablet, TAKE 1 TABLET TWICE DAILY, Disp: 180 tablet, Rfl: 1   vitamin E 1000 UNIT capsule, Take 1,000 Units by mouth daily., Disp: , Rfl:    XARELTO 20 MG TABS tablet, TAKE 1 TABLET EVERY DAY WITH SUPPER, Disp: 90 tablet, Rfl: 6 No current facility-administered medications for this visit.  Facility-Administered Medications Ordered in Other Visits:    heparin lock flush 100 unit/mL, 500 Units, Intravenous, Once, Kefalas, Manon Hilding, PA-C   heparin lock flush 100 unit/mL, 500 Units, Intravenous, Once, Neijstrom, Eric, MD   sodium chloride 0.9 % injection 10 mL, 10 mL, Intravenous, PRN, Kefalas, Manon Hilding, PA-C   sodium chloride 0.9 % injection 10 mL, 10 mL, Intravenous, PRN, Everardo All, MD   Allergies: Allergies  Allergen Reactions   Listerine [Antiseptic Mouth Rinse]     Caused sore throat   Singulair [Montelukast Sodium] Other (See Comments)    Abdominal spasm     REVIEW OF SYSTEMS:   Review  of Systems  Constitutional:  Negative for chills, fatigue and fever.  HENT:    Negative for lump/mass, mouth sores, nosebleeds, sore throat and trouble swallowing.   Eyes:  Negative for eye problems.  Respiratory:  Positive for shortness of breath. Negative for cough.   Cardiovascular:  Negative for chest pain, leg swelling and palpitations.  Gastrointestinal:  Negative for abdominal pain, constipation, diarrhea, nausea and vomiting.  Genitourinary:  Positive for frequency. Negative for bladder incontinence, difficulty urinating, dysuria, hematuria and nocturia.   Musculoskeletal:  Positive for arthralgias. Negative for back pain, flank pain, myalgias and neck pain.  Skin:  Negative for itching and rash.  Neurological:  Negative for dizziness, headaches and numbness.  Hematological:  Does not bruise/bleed easily.  Psychiatric/Behavioral:  Negative for depression, sleep disturbance and suicidal ideas. The patient is not nervous/anxious.      VITALS:   Blood pressure 127/69, pulse (!) 42, temperature 97.6 F (36.4 C), temperature source Oral, resp. rate 16, weight 178 lb 3.2 oz (80.8 kg), SpO2 99 %.  Wt Readings from Last 3 Encounters:  09/18/22 178 lb 3.2 oz (80.8 kg)  06/05/22 178 lb (80.7 kg)  04/12/22 175 lb (79.4 kg)    Body mass index is 30.59 kg/m.  Performance status (ECOG): 1 - Symptomatic but completely ambulatory  PHYSICAL EXAM:   Physical Exam Vitals and nursing note reviewed. Exam conducted with a chaperone present.  Constitutional:      Appearance: Normal appearance.  Cardiovascular:     Rate and Rhythm: Normal rate and regular rhythm.     Pulses: Normal pulses.     Heart sounds: Normal heart sounds.  Pulmonary:     Effort: Pulmonary effort is normal.     Breath sounds: Normal breath sounds.  Chest:     Comments: Left mastectomy WNL   Abdominal:     Palpations: Abdomen is soft. There is no hepatomegaly, splenomegaly or mass.     Tenderness: There is no abdominal tenderness.  Musculoskeletal:     Right lower leg: No edema.     Left  lower leg: No edema.  Lymphadenopathy:     Cervical: No cervical adenopathy.     Right cervical: No superficial, deep or posterior cervical adenopathy.    Left cervical: No superficial, deep or posterior cervical adenopathy.     Upper Body:     Right upper body: No supraclavicular or axillary adenopathy.     Left upper body: No supraclavicular or axillary adenopathy.  Neurological:     General: No focal deficit present.     Mental Status: She is alert and oriented to person, place, and time.  Psychiatric:        Mood and Affect: Mood normal.        Behavior: Behavior normal.     LABS:      Latest Ref Rng & Units 09/18/2022   12:31 PM 03/15/2022    1:04 PM 09/14/2021   12:08 PM  CBC  WBC 4.0 - 10.5 K/uL 3.6  4.0  3.9   Hemoglobin 12.0 - 15.0 g/dL 12.9  13.3  12.2   Hematocrit 36.0 - 46.0 % 40.1  40.5  39.2   Platelets 150 - 400 K/uL 158  164  160       Latest Ref Rng & Units 09/18/2022   12:31 PM 03/15/2022    1:04 PM 09/14/2021   12:08 PM  CMP  Glucose 70 - 99 mg/dL 101  116  104   BUN  8 - 23 mg/dL 15  14  14   $ Creatinine 0.44 - 1.00 mg/dL 0.89  0.90  1.06   Sodium 135 - 145 mmol/L 139  138  139   Potassium 3.5 - 5.1 mmol/L 3.8  3.7  3.9   Chloride 98 - 111 mmol/L 106  106  107   CO2 22 - 32 mmol/L 26  26  29   $ Calcium 8.9 - 10.3 mg/dL 9.3  9.4  9.3   Total Protein 6.5 - 8.1 g/dL 6.7  7.2  6.7   Total Bilirubin 0.3 - 1.2 mg/dL 0.6  0.9  0.6   Alkaline Phos 38 - 126 U/L 33  32  35   AST 15 - 41 U/L 20  23  20   $ ALT 0 - 44 U/L 12  19  13      $ Lab Results  Component Value Date   CEA 1.0 07/21/2014   /  CEA  Date Value Ref Range Status  07/21/2014 1.0 0.0 - 5.0 ng/mL Final    Comment:    Performed at [facility]   No results found for: "PSA1" No results found for: "[id number]" No results found for: "CAN125"  No results found for: "TOTALPROTELP", "ALBUMINELP", "A1GS", "A2GS", "BETS", "BETA2SER", "GAMS", "MSPIKE", "SPEI" Lab Results  Component Value Date    TIBC 375 03/08/2020   FERRITIN 16 03/08/2020   IRONPCTSAT 30 03/08/2020   Lab Results  Component Value Date   LDH 155 09/18/2022   LDH 170 03/15/2022   LDH 152 09/14/2021     STUDIES:   MM 3D SCREEN BREAST UNI RIGHT  Result Date: 08/23/2022 CLINICAL DATA:  Screening. EXAM: DIGITAL SCREENING UNILATERAL RIGHT MAMMOGRAM WITH CAD AND TOMOSYNTHESIS TECHNIQUE: Right screening digital craniocaudal and mediolateral oblique mammograms were obtained. Right screening digital breast tomosynthesis was performed. The images were evaluated with computer-aided detection. COMPARISON:  Previous exam(s). ACR Breast Density Category a: The breast tissue is almost entirely fatty. FINDINGS: The patient has had a left mastectomy. There are no findings suspicious for malignancy. IMPRESSION: No mammographic evidence of malignancy. A result letter of this screening mammogram will be mailed directly to the patient. RECOMMENDATION: Screening mammogram in one year.  (Code:SM-R-5M) BI-RADS CATEGORY  1: Negative. Electronically Signed   By: Kristopher Oppenheim M.D.   On: 08/23/2022 09:37   DG Bone Density  Result Date: 08/22/2022 EXAM: DUAL X-RAY ABSORPTIOMETRY (DXA) FOR BONE MINERAL DENSITY IMPRESSION: Your patient Lisa Klein completed a BMD test on 08/22/2022 using the [employer] (software version: 14.10) manufactured by [employer]. The following summarizes the results of our evaluation. Technologist:[facility] PATIENT BIOGRAPHICAL: Name: Lisa Klein, Lisa Klein Patient ID: [id number] Birth Date: June 21, 1930 Height: 64.0 in. Gender: Female Exam Date: 08/22/2022 Weight: 178.0 lbs. Indications: Advanced Age, Bilateral Oophrectomy, Follow up Osteopenia, Height Loss, History of Fracture (Adult), Hx Breast Ca, Parent Hip Fracture, Post Menopausal Fractures: Hip Treatments: Calcium, Prolia, Synthroid, Vitamin D DENSITOMETRY RESULTS: Site         Region     Measured Date Measured Age WHO Classification Young Adult  T-score BMD         %Change vs. Previous Significant Change (*) Right Femur Neck 08/22/2022 90.1 Normal -0.8 0.930 g/cm2 7.8% Yes Right Femur Neck 08/15/2020 88.1 Osteopenia -1.3 0.863 g/cm2 2.9% - Right Femur Neck 07/21/2018 86.0 Osteopenia -1.4 0.839 g/cm2 -0.4% - Right Femur Neck 07/12/2016 84.0 Osteopenia -1.4 0.842 g/cm2 7.9% Yes Right Femur Neck 05/05/2013 80.8 Osteopenia -1.9 0.780  g/cm2 - - Right Femur Total 08/22/2022 90.1 Normal -0.3 0.973 g/cm2 7.5% Yes Right Femur Total 08/15/2020 88.1 Normal -0.8 0.905 g/cm2 1.5% - Right Femur Total 07/21/2018 86.0 Normal -0.9 0.892 g/cm2 -0.4% - Right Femur Total 07/12/2016 84.0 Normal -0.9 0.896 g/cm2 5.8% Yes Right Femur Total 05/05/2013 80.8 Osteopenia -1.3 0.847 g/cm2 - - Left Forearm Radius 33% 08/22/2022 90.1 Normal 0.0 0.715 g/cm2 2.8% - Left Forearm Radius 33% 08/15/2020 88.1 Normal -0.3 0.695 g/cm2 -1.9% - Left Forearm Radius 33% 07/21/2018 86.0 Normal -0.1 0.709 g/cm2 0.9% - Left Forearm Radius 33% 07/12/2016 84.0 Normal -0.2 0.703 g/cm2 -0.3% - Left Forearm Radius 33% 05/05/2013 80.8 Normal -0.1 0.705 g/cm2 - - ASSESSMENT: The BMD measured at Femur Neck is 0.930 g/cm2 with a T-score of -0.8. This patient is considered normal according to [facility]) criteria. The scan quality is good. Compared with the prior study on 08/15/20, the BMD of the rt. hip shows a statistically significant increase. Lumbar spine was excluded due to advanced degenerative changes. The lt. hip was excluded due to surgical repair. World Pharmacologist [facility]) criteria for post-menopausal, Caucasian Women: Normal:       T-score at or above -1 SD Osteopenia:   T-score between -1 and -2.5 SD Osteoporosis: T-score at or below -2.5 SD RECOMMENDATIONS: 1. All patients should optimize calcium and vitamin D intake. 2. Consider FDA-approved medical therapies in postmenopausal women and med aged 87 years and older, based on the following: a. A hip or vertebral (clinical or  morphometric) fracture b. T-score < -2.5 at the femoral neck or spine after appropriate evaluation to exclude secondary causes c. Low bone mass (T-score between -1.0 and -2.5 at the femoral neck or spine) and a 10-year probability of a hip fracture > 3% or a 10-year probability of a major osteoporosis-related fracture > 20% based on the [place]-adapted WHO algorithm d. Clinician judgment and/or patient preferences may indicate treatment for people with 10-year fracture probabilities above or below these levels FOLLOW-UP: Patients with diagnosis of osteoporosis or at high risk for fracture should have regular bone mineral density tests. For patients eligible for Medicare, routine testing is allowed once every 2 years. The testing frequency can be increased to one year for patients who have rapidly progressing disease, those who are receiving or discontinuing medical therapy to restore bone mass, or have additional risk factors. I have reviewed this report, and agree with the above findings. Mark A. Thornton Papas, M.D. [facility] Electronically Signed   By: Lavonia Dana M.D.   On: 08/22/2022 10:47

## 2022-09-18 NOTE — Progress Notes (Signed)
Patient tolerated therapy with no complaints voiced. Labs reviewed. Side effects with management reviewed with understanding verbalized. Peripheral IV site clean and dry with no bruising or swelling noted at site. Good blood return noted before and after administration of therapy. Band aid applied. Patient left in satisfactory condition with VSS and no s/s of distress noted.

## 2022-09-19 LAB — MISC LABCORP TEST (SEND OUT): Labcorp test code: 81950

## 2022-10-04 ENCOUNTER — Encounter: Payer: Self-pay | Admitting: Radiology

## 2022-10-11 ENCOUNTER — Ambulatory Visit (INDEPENDENT_AMBULATORY_CARE_PROVIDER_SITE_OTHER): Payer: Medicare HMO | Admitting: Family Medicine

## 2022-10-11 ENCOUNTER — Encounter: Payer: Self-pay | Admitting: Family Medicine

## 2022-10-11 VITALS — BP 130/79 | HR 88 | Ht 64.0 in | Wt 179.1 lb

## 2022-10-11 DIAGNOSIS — Z6834 Body mass index (BMI) 34.0-34.9, adult: Secondary | ICD-10-CM | POA: Diagnosis not present

## 2022-10-11 DIAGNOSIS — E039 Hypothyroidism, unspecified: Secondary | ICD-10-CM | POA: Diagnosis not present

## 2022-10-11 DIAGNOSIS — B369 Superficial mycosis, unspecified: Secondary | ICD-10-CM

## 2022-10-11 DIAGNOSIS — E6609 Other obesity due to excess calories: Secondary | ICD-10-CM | POA: Diagnosis not present

## 2022-10-11 DIAGNOSIS — C50912 Malignant neoplasm of unspecified site of left female breast: Secondary | ICD-10-CM

## 2022-10-11 DIAGNOSIS — M159 Polyosteoarthritis, unspecified: Secondary | ICD-10-CM

## 2022-10-11 DIAGNOSIS — Z7901 Long term (current) use of anticoagulants: Secondary | ICD-10-CM

## 2022-10-11 DIAGNOSIS — E7849 Other hyperlipidemia: Secondary | ICD-10-CM

## 2022-10-11 DIAGNOSIS — I10 Essential (primary) hypertension: Secondary | ICD-10-CM | POA: Diagnosis not present

## 2022-10-11 DIAGNOSIS — M1712 Unilateral primary osteoarthritis, left knee: Secondary | ICD-10-CM | POA: Insufficient documentation

## 2022-10-11 MED ORDER — CLOTRIMAZOLE-BETAMETHASONE 1-0.05 % EX CREA: 1.0000 | TOPICAL_CREAM | Freq: Two times a day (BID) | CUTANEOUS | 1 refills | Status: DC

## 2022-10-11 NOTE — Assessment & Plan Note (Signed)
Decreased mobility and increased pain requests rept injection in 4 to 6 weeks

## 2022-10-11 NOTE — Progress Notes (Signed)
Lisa Klein     MRN: [id number]      DOB: 1930-03-12   HPI Lisa Klein is here for follow up and re-evaluation of chronic medical conditions, medication management and review of any available recent lab and radiology data.  Preventive health is updated, specifically  Cancer screening and Immunization.   Hd successful intra articular injection left knee , requests appt for injection in left knee due to pain and swelling No falls or near falls, uses cane for safety   ROS Denies recent fever or chills. Denies sinus pressure, nasal congestion, ear pain or sore throat. Denies chest congestion, productive cough or wheezing. Denies chest pains, palpitations and leg swelling Denies abdominal pain, nausea, vomiting,diarrhea or constipation.   Denies dysuria, frequency, hesitancy or incontinence. Chronic  limitation in mobility. Denies headaches, seizures, numbness, or tingling. Denies depression, anxiety or insomnia. Intermittent pruritic rash in elbow crease and behind knees , requests medication for as needed use, also under breast PE  BP 130/79 (BP Location: Right Arm, Patient Position: Sitting, Cuff Size: Large)   Pulse 88   Ht '5\' 4"'$  (1.626 m)   Wt 179 lb 1.9 oz (81.2 kg)   SpO2 96%   BMI 30.75 kg/m   Patient alert and oriented and in no cardiopulmonary distress.  HEENT: No facial asymmetry, EOMI,     Neck supple .  Chest: Clear to auscultation bilaterally.  CVS: S1, S2 no murmurs, no S3.Regular rate.  ABD: Soft non tender.   Ext: No edema  MS: Adequate ROM spine, shoulders, hips and knees.  Skin: Intact, no ulcerations or rash noted.  Psych: Good eye contact, normal affect. Memory intact not anxious or depressed appearing.  CNS: CN 2-12 intact, power,  normal throughout.no focal deficits noted.   Assessment & Plan  Osteoarthritis of left knee Decreased mobility and increased pain requests rept injection in 4 to 6 weeks  Essential hypertension Controlled,  no change in medication DASH diet and commitment to daily physical activity for a minimum of 30 minutes discussed and encouraged, as a part of hypertension management. The importance of attaining a healthy weight is also discussed.     10/11/2022    1:11 PM 09/18/2022   12:53 PM 06/05/2022    1:42 PM 04/12/2022    1:59 PM 04/12/2022    1:26 PM 03/22/2022    1:30 PM 12/19/2021    1:26 PM  BP/Weight  Systolic BP [id number] 0000000 0000000 [id number] 0000000  Diastolic BP 79 69 82 64 63 55 77  Wt. (Lbs) 179.12 178.2 178  175 177.03 182  BMI 30.75 kg/m2 30.59 kg/m2 30.55 kg/m2  31 kg/m2 30.39 kg/m2 31.24 kg/m2       Hypothyroidism Controlled, no change in medication Updated lab needed at/ before next visit.   Hyperlipemia Hyperlipidemia:Low fat diet discussed and encouraged.   Lipid Panel  Lab Results  Component Value Date   CHOL 165 04/12/2022   HDL 65 04/12/2022   LDLCALC 87 04/12/2022   TRIG 70 04/12/2022   CHOLHDL 2.5 04/12/2022     Updated lab needed at/ before next visit.   Obesity  Patient re-educated about  the importance of commitment to a  minimum of 150 minutes of exercise per week as able.  The importance of healthy food choices with portion control discussed, as well as eating regularly and within a 12 hour window most days. The need to choose "clean , green" food 50 to 75% of the time is  discussed, as well as to make water the primary drink and set a goal of 64 ounces water daily.       10/11/2022    1:11 PM 09/18/2022   12:53 PM 06/05/2022    1:42 PM  Weight /BMI  Weight 179 lb 1.9 oz 178 lb 3.2 oz 178 lb  Height '5\' 4"'$  (1.626 m)  '5\' 4"'$  (1.626 m)  BMI 30.75 kg/m2 30.59 kg/m2 30.55 kg/m2      Generalized osteoarthritis of multiple sites Home safety reviewed at visit, has good practices in place and uses assistive device for safe ambulation  Infiltrating ductal carcinoma of breast ([place]) Currently on arimidex and followed by Oncology  Long term current use of  anticoagulant therapy No complication of hematuria, epistaxis or rectal bleeding, lifelong commitment  Dermatomycosis Inteemittent flares, needs topical med for as needed ue

## 2022-10-11 NOTE — Patient Instructions (Signed)
Annual exam Sept 15 or after, call if you need me sooner  Please sched appt with Dr Court Joy end April or by mid May for left knee injection for arthritis  Please get lipid, and TSH  in May at the hospital , the same day you are getting labs for Oncology  You need TdAP, covid vaccine and RSV vaccines, all are at your pharmacy take the at least 2 weeks apart  Creful not tofall.  Keep doing what you are doing!  Thanks for choosing [facility], we consider it a privelige to serve you.

## 2022-10-17 ENCOUNTER — Encounter: Payer: Self-pay | Admitting: Family Medicine

## 2022-10-17 NOTE — Assessment & Plan Note (Signed)
Inteemittent flares, needs topical med for as needed ue

## 2022-10-17 NOTE — Assessment & Plan Note (Signed)
  Patient re-educated about  the importance of commitment to a  minimum of 150 minutes of exercise per week as able.  The importance of healthy food choices with portion control discussed, as well as eating regularly and within a 12 hour window most days. The need to choose "clean , green" food 50 to 75% of the time is discussed, as well as to make water the primary drink and set a goal of 64 ounces water daily.       10/11/2022    1:11 PM 09/18/2022   12:53 PM 06/05/2022    1:42 PM  Weight /BMI  Weight 179 lb 1.9 oz 178 lb 3.2 oz 178 lb  Height 5\' 4"  (1.626 m)  5\' 4"  (1.626 m)  BMI 30.75 kg/m2 30.59 kg/m2 30.55 kg/m2

## 2022-10-17 NOTE — Assessment & Plan Note (Signed)
Controlled, no change in medication DASH diet and commitment to daily physical activity for a minimum of 30 minutes discussed and encouraged, as a part of hypertension management. The importance of attaining a healthy weight is also discussed.     10/11/2022    1:11 PM 09/18/2022   12:53 PM 06/05/2022    1:42 PM 04/12/2022    1:59 PM 04/12/2022    1:26 PM 03/22/2022    1:30 PM 12/19/2021    1:26 PM  BP/Weight  Systolic BP [id number] 0000000 0000000 Q000111Q A999333 0000000  Diastolic BP 79 69 82 64 63 55 77  Wt. (Lbs) 179.12 178.2 178  175 177.03 182  BMI 30.75 kg/m2 30.59 kg/m2 30.55 kg/m2  31 kg/m2 30.39 kg/m2 31.24 kg/m2

## 2022-10-17 NOTE — Assessment & Plan Note (Signed)
Home safety reviewed at visit, has good practices in place and uses assistive device for safe ambulation

## 2022-10-17 NOTE — Assessment & Plan Note (Signed)
No complication of hematuria, epistaxis or rectal bleeding, lifelong commitment

## 2022-10-17 NOTE — Assessment & Plan Note (Signed)
Controlled, no change in medication Updated lab needed at/ before next visit.  

## 2022-10-17 NOTE — Assessment & Plan Note (Signed)
Currently on arimidex and followed by Oncology

## 2022-10-17 NOTE — Assessment & Plan Note (Signed)
Hyperlipidemia:Low fat diet discussed and encouraged.   Lipid Panel  Lab Results  Component Value Date   CHOL 165 04/12/2022   HDL 65 04/12/2022   LDLCALC 87 04/12/2022   TRIG 70 04/12/2022   CHOLHDL 2.5 04/12/2022     Updated lab needed at/ before next visit.

## 2022-11-16 DIAGNOSIS — H35033 Hypertensive retinopathy, bilateral: Secondary | ICD-10-CM | POA: Diagnosis not present

## 2022-11-16 DIAGNOSIS — H25813 Combined forms of age-related cataract, bilateral: Secondary | ICD-10-CM | POA: Diagnosis not present

## 2022-11-16 DIAGNOSIS — H401233 Low-tension glaucoma, bilateral, severe stage: Secondary | ICD-10-CM | POA: Diagnosis not present

## 2022-11-16 DIAGNOSIS — H04123 Dry eye syndrome of bilateral lacrimal glands: Secondary | ICD-10-CM | POA: Diagnosis not present

## 2022-11-25 ENCOUNTER — Other Ambulatory Visit: Payer: Self-pay | Admitting: Hematology

## 2022-11-25 DIAGNOSIS — Z7901 Long term (current) use of anticoagulants: Secondary | ICD-10-CM

## 2022-11-29 ENCOUNTER — Other Ambulatory Visit: Payer: Self-pay | Admitting: Family Medicine

## 2022-12-03 ENCOUNTER — Ambulatory Visit (INDEPENDENT_AMBULATORY_CARE_PROVIDER_SITE_OTHER): Payer: Medicare HMO | Admitting: Internal Medicine

## 2022-12-03 ENCOUNTER — Encounter: Payer: Self-pay | Admitting: Internal Medicine

## 2022-12-03 VITALS — BP 131/77 | HR 81 | Resp 97 | Ht 64.0 in | Wt 180.0 lb

## 2022-12-03 DIAGNOSIS — M1712 Unilateral primary osteoarthritis, left knee: Secondary | ICD-10-CM

## 2022-12-03 DIAGNOSIS — H6123 Impacted cerumen, bilateral: Secondary | ICD-10-CM | POA: Diagnosis not present

## 2022-12-03 MED ORDER — BUPIVACAINE HCL (PF) 0.25 % IJ SOLN
4.0000 mL | Freq: Once | INTRAMUSCULAR | Status: AC
Start: 2022-12-03 — End: 2022-12-03
  Administered 2022-12-03: 4 mL

## 2022-12-03 MED ORDER — LIDOCAINE HCL (PF) 1 % IJ SOLN
2.0000 mL | Freq: Once | INTRAMUSCULAR | Status: AC
Start: 2022-12-03 — End: 2022-12-03
  Administered 2022-12-03: 2 mL

## 2022-12-03 MED ORDER — TRIAMCINOLONE ACETONIDE 40 MG/ML IJ SUSP
40.0000 mg | Freq: Once | INTRAMUSCULAR | Status: AC
Start: 2022-12-03 — End: 2022-12-03
  Administered 2022-12-03: 40 mg via INTRA_ARTICULAR

## 2022-12-03 NOTE — Assessment & Plan Note (Signed)
Patient hearing has been muffled over a month. On exam she had impacted cerumen. Unable to visualize right ear drum. Left ear drum was normal. Nurse performed irrigation and patient hearing improved. Recommended follow up if symptoms return.

## 2022-12-03 NOTE — Patient Instructions (Signed)
Thank you, Ms.Marquis Buggy for allowing [place] to provide your care today.   Ice your knee upon leaving clinic and refrain from overuse over the next 3 days. Go to the emergency room with any usual pain, swelling, or redness occurred in the injected area.      Thurmon Fair, M.D.

## 2022-12-03 NOTE — Assessment & Plan Note (Signed)
Hx of left knee osteoarthritis. I last saw her in 06/05/2022 for steroid injection.  She found improvement in her pain and it improved her mobility. Pain has worsened in the last few weeks. She presents today for repeat injection. No falls or injury to knee.  Aspiration/Injection Procedure Note LIMA CHILLEMI September 29, 1929  Procedure: Injection Indications: left knee osteoarthritis  Risks of procedure as well as the alternatives and risks of each were explained to the patient. Consent for procedure obtained.    After a time out was preformed, the knee was prepped in a sterile fashion. Cold spray was applied to the skin over the insertion site. The left knee superior lateral suprapatellar pouch was injected using 1 cc of 1% lidocaine on a 22-gauge 1-1/2 inch needle.  The syringe was switched to mixture containing 1 cc's of 40 mg Kenalog and 4 cc's of .25% bupivacaine was injected.  Ultrasound was used. A sterile dressing was applied. The patient tolerated the procedures well without complication.

## 2022-12-03 NOTE — Progress Notes (Signed)
   HPI:Lisa Klein is a 89 y.o. female who presents for evaluation of knee injection  (Chronic left knee pain ) . For the details of today's visit, please refer to the assessment and plan.  Physical Exam: Vitals:   12/03/22 1310  BP: 131/77  Pulse: 81  Resp: (!) 97  Weight: 180 lb (81.6 kg)  Height: 5\' 4"  (1.626 m)  HC: 16" (40.6 cm)     Physical Exam Constitutional:      General: She is not in acute distress.    Comments: Walking with cain, antalgic gait  HENT:     Right Ear: There is impacted cerumen.     Left Ear: Tympanic membrane normal. There is impacted cerumen.  Musculoskeletal:        General: Tenderness (lateral joint line) present. No swelling or deformity.      Assessment & Plan:   Lisa Klein was seen today for knee injection .  Primary osteoarthritis of left knee Assessment & Plan: Hx of left knee osteoarthritis. I last saw her in 06/05/2022 for steroid injection.  She found improvement in her pain and it improved her mobility. Pain has worsened in the last few weeks. She presents today for repeat injection. No falls or injury to knee.  Aspiration/Injection Procedure Note Lisa Klein 15-Mar-1930  Procedure: Injection Indications: left knee osteoarthritis  Risks of procedure as well as the alternatives and risks of each were explained to the patient. Consent for procedure obtained.    After a time out was preformed, the knee was prepped in a sterile fashion. Cold spray was applied to the skin over the insertion site. The left knee superior lateral suprapatellar pouch was injected using 1 cc of 1% lidocaine on a 22-gauge 1-1/2 inch needle.  The syringe was switched to mixture containing 1 cc's of 40 mg Kenalog and 4 cc's of .25% bupivacaine was injected.  Ultrasound was used. A sterile dressing was applied. The patient tolerated the procedures well without complication.     Orders: -     BUPivacaine HCl (PF) -     Lidocaine HCl (PF) -      Triamcinolone Acetonide  Impacted cerumen of both ears Assessment & Plan: Patient hearing has been muffled over a month. On exam she had impacted cerumen. Unable to visualize right ear drum. Left ear drum was normal. Nurse performed irrigation and patient hearing improved. Recommended follow up if symptoms return.        Milus Banister, MD

## 2022-12-10 ENCOUNTER — Other Ambulatory Visit: Payer: Self-pay

## 2022-12-10 DIAGNOSIS — C50912 Malignant neoplasm of unspecified site of left female breast: Secondary | ICD-10-CM

## 2022-12-11 ENCOUNTER — Inpatient Hospital Stay: Payer: Medicare HMO | Attending: Hematology

## 2022-12-11 ENCOUNTER — Inpatient Hospital Stay: Payer: Medicare HMO

## 2022-12-11 VITALS — BP 125/58 | HR 64 | Temp 98.1°F | Resp 16

## 2022-12-11 DIAGNOSIS — C50912 Malignant neoplasm of unspecified site of left female breast: Secondary | ICD-10-CM | POA: Insufficient documentation

## 2022-12-11 DIAGNOSIS — M8580 Other specified disorders of bone density and structure, unspecified site: Secondary | ICD-10-CM

## 2022-12-11 DIAGNOSIS — Z8261 Family history of arthritis: Secondary | ICD-10-CM | POA: Diagnosis not present

## 2022-12-11 DIAGNOSIS — Z170 Estrogen receptor positive status [ER+]: Secondary | ICD-10-CM | POA: Diagnosis not present

## 2022-12-11 DIAGNOSIS — Z79899 Other long term (current) drug therapy: Secondary | ICD-10-CM | POA: Insufficient documentation

## 2022-12-11 DIAGNOSIS — Z7901 Long term (current) use of anticoagulants: Secondary | ICD-10-CM | POA: Diagnosis not present

## 2022-12-11 DIAGNOSIS — E559 Vitamin D deficiency, unspecified: Secondary | ICD-10-CM | POA: Insufficient documentation

## 2022-12-11 DIAGNOSIS — R0602 Shortness of breath: Secondary | ICD-10-CM | POA: Insufficient documentation

## 2022-12-11 DIAGNOSIS — Z79811 Long term (current) use of aromatase inhibitors: Secondary | ICD-10-CM | POA: Diagnosis not present

## 2022-12-11 DIAGNOSIS — Z7982 Long term (current) use of aspirin: Secondary | ICD-10-CM | POA: Diagnosis not present

## 2022-12-11 DIAGNOSIS — R350 Frequency of micturition: Secondary | ICD-10-CM | POA: Insufficient documentation

## 2022-12-11 DIAGNOSIS — Z86718 Personal history of other venous thrombosis and embolism: Secondary | ICD-10-CM | POA: Diagnosis not present

## 2022-12-11 DIAGNOSIS — Z86711 Personal history of pulmonary embolism: Secondary | ICD-10-CM | POA: Insufficient documentation

## 2022-12-11 DIAGNOSIS — Z8249 Family history of ischemic heart disease and other diseases of the circulatory system: Secondary | ICD-10-CM | POA: Diagnosis not present

## 2022-12-11 DIAGNOSIS — D72819 Decreased white blood cell count, unspecified: Secondary | ICD-10-CM | POA: Diagnosis not present

## 2022-12-11 DIAGNOSIS — Z9012 Acquired absence of left breast and nipple: Secondary | ICD-10-CM | POA: Diagnosis not present

## 2022-12-11 DIAGNOSIS — Z923 Personal history of irradiation: Secondary | ICD-10-CM | POA: Insufficient documentation

## 2022-12-11 LAB — CBC WITH DIFFERENTIAL/PLATELET
Abs Immature Granulocytes: 0.01 10*3/uL (ref 0.00–0.07)
Basophils Absolute: 0.1 10*3/uL (ref 0.0–0.1)
Basophils Relative: 1 %
Eosinophils Absolute: 0.2 10*3/uL (ref 0.0–0.5)
Eosinophils Relative: 4 %
HCT: 42.3 % (ref 36.0–46.0)
Hemoglobin: 13.7 g/dL (ref 12.0–15.0)
Immature Granulocytes: 0 %
Lymphocytes Relative: 34 %
Lymphs Abs: 1.5 10*3/uL (ref 0.7–4.0)
MCH: 29.7 pg (ref 26.0–34.0)
MCHC: 32.4 g/dL (ref 30.0–36.0)
MCV: 91.6 fL (ref 80.0–100.0)
Monocytes Absolute: 0.5 10*3/uL (ref 0.1–1.0)
Monocytes Relative: 12 %
Neutro Abs: 2.1 10*3/uL (ref 1.7–7.7)
Neutrophils Relative %: 49 %
Platelets: 180 10*3/uL (ref 150–400)
RBC: 4.62 MIL/uL (ref 3.87–5.11)
RDW: 14.9 % (ref 11.5–15.5)
WBC: 4.3 10*3/uL (ref 4.0–10.5)
nRBC: 0 % (ref 0.0–0.2)

## 2022-12-11 LAB — COMPREHENSIVE METABOLIC PANEL
ALT: 19 U/L (ref 0–44)
AST: 21 U/L (ref 15–41)
Albumin: 3.7 g/dL (ref 3.5–5.0)
Alkaline Phosphatase: 36 U/L — ABNORMAL LOW (ref 38–126)
Anion gap: 9 (ref 5–15)
BUN: 17 mg/dL (ref 8–23)
CO2: 25 mmol/L (ref 22–32)
Calcium: 9.1 mg/dL (ref 8.9–10.3)
Chloride: 101 mmol/L (ref 98–111)
Creatinine, Ser: 0.89 mg/dL (ref 0.44–1.00)
GFR, Estimated: >60 mL/min (ref >60)
Glucose, Bld: 90 mg/dL (ref 70–99)
Potassium: 3.8 mmol/L (ref 3.5–5.1)
Sodium: 135 mmol/L (ref 135–145)
Total Bilirubin: 1.1 mg/dL (ref 0.3–1.2)
Total Protein: 6.9 g/dL (ref 6.5–8.1)

## 2022-12-11 LAB — MAGNESIUM: Magnesium: 2 mg/dL (ref 1.7–2.4)

## 2022-12-11 MED ORDER — ZOLEDRONIC ACID 4 MG/100ML IV SOLN
4.0000 mg | Freq: Once | INTRAVENOUS | Status: AC
  Administered 2022-12-11: 4 mg via INTRAVENOUS
  Filled 2022-12-11: qty 100

## 2022-12-11 MED ORDER — SODIUM CHLORIDE 0.9 % IV SOLN: Freq: Once | INTRAVENOUS | Status: AC

## 2022-12-11 NOTE — Patient Instructions (Signed)
[facility]  Discharge Instructions: Thank you for choosing  Cancer Center to provide your oncology and hematology care.  If you have a lab appointment with the Cancer Center - please note that after April 8th, 2024, all labs will be drawn in the cancer center.  You do not have to check in or register with the main entrance as you have in the past but will complete your check-in in the cancer center.  Wear comfortable clothing and clothing appropriate for easy access to any Portacath or PICC line.   We strive to give you quality time with your provider. You may need to reschedule your appointment if you arrive late (15 or more minutes).  Arriving late affects you and other patients whose appointments are after yours.  Also, if you miss three or more appointments without notifying the office, you may be dismissed from the clinic at the provider's discretion.      For prescription refill requests, have your pharmacy contact our office and allow 72 hours for refills to be completed.    Today you received the following chemotherapy and/or immunotherapy agents Zometa. Zoledronic Acid Injection (Cancer) What is this medication? ZOLEDRONIC ACID (ZOE le dron ik AS id) treats high calcium levels in the blood caused by cancer. It may also be used with chemotherapy to treat weakened bones caused by cancer. It works by slowing down the release of calcium from bones. This lowers calcium levels in your blood. It also makes your bones stronger and less likely to break (fracture). It belongs to a group of medications called bisphosphonates. This medicine may be used for other purposes; ask your health care provider or pharmacist if you have questions. COMMON BRAND NAME(S): Zometa, Zometa Powder What should I tell my care team before I take this medication? They need to know if you have any of these conditions: Dehydration Dental disease Kidney disease Liver disease Low levels  of calcium in the blood Lung or breathing disease, such as asthma Receiving steroids, such as dexamethasone or prednisone An unusual or allergic reaction to zoledronic acid, other medications, foods, dyes, or preservatives Pregnant or trying to get pregnant Breast-feeding How should I use this medication? This medication is injected into a vein. It is given by your care team in a hospital or clinic setting. Talk to your care team about the use of this medication in children. Special care may be needed. Overdosage: If you think you have taken too much of this medicine contact a poison control center or emergency room at once. NOTE: This medicine is only for you. Do not share this medicine with others. What if I miss a dose? Keep appointments for follow-up doses. It is important not to miss your dose. Call your care team if you are unable to keep an appointment. What may interact with this medication? Certain antibiotics given by injection Diuretics, such as bumetanide, furosemide NSAIDs, medications for pain and inflammation, such as ibuprofen or naproxen Teriparatide Thalidomide This list may not describe all possible interactions. Give your health care provider a list of all the medicines, herbs, non-prescription drugs, or dietary supplements you use. Also tell them if you smoke, drink alcohol, or use illegal drugs. Some items may interact with your medicine. What should I watch for while using this medication? Visit your care team for regular checks on your progress. It may be some time before you see the benefit from this medication. Some people who take this medication have severe  bone, joint, or muscle pain. This medication may also increase your risk for jaw problems or a broken thigh bone. Tell your care team right away if you have severe pain in your jaw, bones, joints, or muscles. Tell you care team if you have any pain that does not go away or that gets worse. Tell your dentist and  dental surgeon that you are taking this medication. You should not have major dental surgery while on this medication. See your dentist to have a dental exam and fix any dental problems before starting this medication. Take good care of your teeth while on this medication. Make sure you see your dentist for regular follow-up appointments. You should make sure you get enough calcium and vitamin D while you are taking this medication. Discuss the foods you eat and the vitamins you take with your care team. Check with your care team if you have severe diarrhea, nausea, and vomiting, or if you sweat a lot. The loss of too much body fluid may make it dangerous for you to take this medication. You may need bloodwork while taking this medication. Talk to your care team if you wish to become pregnant or think you might be pregnant. This medication can cause serious birth defects. What side effects may I notice from receiving this medication? Side effects that you should report to your care team as soon as possible: Allergic reactions--skin rash, itching, hives, swelling of the face, lips, tongue, or throat Kidney injury--decrease in the amount of urine, swelling of the ankles, hands, or feet Low calcium level--muscle pain or cramps, confusion, tingling, or numbness in the hands or feet Osteonecrosis of the jaw--pain, swelling, or redness in the mouth, numbness of the jaw, poor healing after dental work, unusual discharge from the mouth, visible bones in the mouth Severe bone, joint, or muscle pain Side effects that usually do not require medical attention (report to your care team if they continue or are bothersome): Constipation Fatigue Fever Loss of appetite Nausea Stomach pain This list may not describe all possible side effects. Call your doctor for medical advice about side effects. You may report side effects to FDA at 1-800-FDA-1088. Where should I keep my medication? This medication is given in  a hospital or clinic. It will not be stored at home. NOTE: This sheet is a summary. It may not cover all possible information. If you have questions about this medicine, talk to your doctor, pharmacist, or health care provider.  2023 Elsevier/Gold Standard (2007-09-13 00:00:00)       To help prevent nausea and vomiting after your treatment, we encourage you to take your nausea medication as directed.  BELOW ARE SYMPTOMS THAT SHOULD BE REPORTED IMMEDIATELY: *FEVER GREATER THAN 100.4 F (38 C) OR HIGHER *CHILLS OR SWEATING *NAUSEA AND VOMITING THAT IS NOT CONTROLLED WITH YOUR NAUSEA MEDICATION *UNUSUAL SHORTNESS OF BREATH *UNUSUAL BRUISING OR BLEEDING *URINARY PROBLEMS (pain or burning when urinating, or frequent urination) *BOWEL PROBLEMS (unusual diarrhea, constipation, pain near the anus) TENDERNESS IN MOUTH AND THROAT WITH OR WITHOUT PRESENCE OF ULCERS (sore throat, sores in mouth, or a toothache) UNUSUAL RASH, SWELLING OR PAIN  UNUSUAL VAGINAL DISCHARGE OR ITCHING   Items with * indicate a potential emergency and should be followed up as soon as possible or go to the Emergency Department if any problems should occur.  Please show the CHEMOTHERAPY ALERT CARD or IMMUNOTHERAPY ALERT CARD at check-in to the Emergency Department and triage nurse.  Should you have questions after  your visit or need to cancel or reschedule your appointment, please contact [facility] [phone]  and follow the prompts.  Office hours are 8:00 a.m. to 4:30 p.m. Monday - Friday. Please note that voicemails left after 4:00 p.m. may not be returned until the following business day.  We are closed weekends and major holidays. You have access to a nurse at all times for urgent questions. Please call the main number to the clinic [phone] and follow the prompts.  For any non-urgent questions, you may also contact your provider using MyChart. We now offer e-Visits for anyone 18 and older to  request care online for non-urgent symptoms. For details visit [email or web address].   Also download the MyChart app! Go to the app store, search "MyChart", open the app, select [facility], and log in with your MyChart username and password.

## 2022-12-11 NOTE — Progress Notes (Signed)
Patient presents today for Zometa infusion. Vital signs stable. Patient denies any changes since her last infusion. Patient taking Calcium and Vitamin D supplements (Oscal with Vitamin D 200-500 mg-unit tablet BID) as prescribed. Patient denies any jab pain or upcoming dental surgery. Labs pending.   Labs within parameters for treatment.   Zometa given today per MD orders. Tolerated infusion without adverse affects. Vital signs stable. No complaints at this time. Discharged from clinic by wheel chair in stable condition. Alert and oriented x 3. F/U with [facility] as scheduled.

## 2022-12-12 ENCOUNTER — Other Ambulatory Visit: Payer: Self-pay | Admitting: Family Medicine

## 2022-12-29 ENCOUNTER — Emergency Department (HOSPITAL_COMMUNITY)
Admission: EM | Admit: 2022-12-29 | Discharge: 2022-12-29 | Disposition: A | Discharge status: Home / Self Care | Payer: Medicare HMO | Attending: Emergency Medicine | Admitting: Emergency Medicine

## 2022-12-29 ENCOUNTER — Emergency Department (HOSPITAL_COMMUNITY): Payer: Medicare HMO

## 2022-12-29 ENCOUNTER — Encounter (HOSPITAL_COMMUNITY): Payer: Self-pay | Admitting: *Deleted

## 2022-12-29 ENCOUNTER — Other Ambulatory Visit: Payer: Self-pay

## 2022-12-29 DIAGNOSIS — Z7901 Long term (current) use of anticoagulants: Secondary | ICD-10-CM | POA: Insufficient documentation

## 2022-12-29 DIAGNOSIS — R42 Dizziness and giddiness: Secondary | ICD-10-CM | POA: Insufficient documentation

## 2022-12-29 DIAGNOSIS — R110 Nausea: Secondary | ICD-10-CM | POA: Diagnosis not present

## 2022-12-29 DIAGNOSIS — R519 Headache, unspecified: Secondary | ICD-10-CM | POA: Diagnosis not present

## 2022-12-29 DIAGNOSIS — I10 Essential (primary) hypertension: Secondary | ICD-10-CM | POA: Diagnosis not present

## 2022-12-29 LAB — URINALYSIS, ROUTINE W REFLEX MICROSCOPIC
Bilirubin Urine: NEGATIVE
Glucose, UA: NEGATIVE mg/dL
Hgb urine dipstick: NEGATIVE
Ketones, ur: NEGATIVE mg/dL
Leukocytes,Ua: NEGATIVE
Nitrite: NEGATIVE
Protein, ur: NEGATIVE mg/dL
Specific Gravity, Urine: 1.019 (ref 1.005–1.030)
pH: 7 (ref 5.0–8.0)

## 2022-12-29 LAB — COMPREHENSIVE METABOLIC PANEL
ALT: 20 U/L (ref 0–44)
AST: 22 U/L (ref 15–41)
Albumin: 3.5 g/dL (ref 3.5–5.0)
Alkaline Phosphatase: 33 U/L — ABNORMAL LOW (ref 38–126)
Anion gap: 10 (ref 5–15)
BUN: 16 mg/dL (ref 8–23)
CO2: 25 mmol/L (ref 22–32)
Calcium: 8.8 mg/dL — ABNORMAL LOW (ref 8.9–10.3)
Chloride: 101 mmol/L (ref 98–111)
Creatinine, Ser: 0.72 mg/dL (ref 0.44–1.00)
GFR, Estimated: >60 mL/min (ref >60)
Glucose, Bld: 97 mg/dL (ref 70–99)
Potassium: 3.4 mmol/L — ABNORMAL LOW (ref 3.5–5.1)
Sodium: 136 mmol/L (ref 135–145)
Total Bilirubin: 0.9 mg/dL (ref 0.3–1.2)
Total Protein: 6.8 g/dL (ref 6.5–8.1)

## 2022-12-29 LAB — CBC WITH DIFFERENTIAL/PLATELET
Abs Immature Granulocytes: 0.01 10*3/uL (ref 0.00–0.07)
Basophils Absolute: 0 10*3/uL (ref 0.0–0.1)
Basophils Relative: 1 %
Eosinophils Absolute: 0 10*3/uL (ref 0.0–0.5)
Eosinophils Relative: 0 %
HCT: 39 % (ref 36.0–46.0)
Hemoglobin: 12.7 g/dL (ref 12.0–15.0)
Immature Granulocytes: 0 %
Lymphocytes Relative: 29 %
Lymphs Abs: 1.1 10*3/uL (ref 0.7–4.0)
MCH: 29.5 pg (ref 26.0–34.0)
MCHC: 32.6 g/dL (ref 30.0–36.0)
MCV: 90.5 fL (ref 80.0–100.0)
Monocytes Absolute: 0.4 10*3/uL (ref 0.1–1.0)
Monocytes Relative: 10 %
Neutro Abs: 2.2 10*3/uL (ref 1.7–7.7)
Neutrophils Relative %: 60 %
Platelets: 146 10*3/uL — ABNORMAL LOW (ref 150–400)
RBC: 4.31 MIL/uL (ref 3.87–5.11)
RDW: 15.4 % (ref 11.5–15.5)
WBC: 3.6 10*3/uL — ABNORMAL LOW (ref 4.0–10.5)
nRBC: 0 % (ref 0.0–0.2)

## 2022-12-29 NOTE — ED Triage Notes (Signed)
Pt BIB [facility] for c/o dizziness and nausea that started this am  Pt c/o some neck pain

## 2022-12-29 NOTE — ED Provider Notes (Signed)
[place] EMERGENCY DEPARTMENT AT [facility] Provider Note   CSN: [id number] Arrival date & time: 12/29/22  1224     History  Chief Complaint  Patient presents with   Dizziness    Lisa Klein is a 89 y.o. female.  Patient complains of dizziness and some nausea that started this a.m.  Patient reports that she feels lightheaded.  Patient complains of a headache and feeling like she has pressure in her face.  Patient denies any fever or chills she denies any cough or congestion.  Patient reports that she has not had any weakness in her arms or her legs.  Patient denies any abdominal pain she has not had any vomiting she denies any diarrhea.  Patient reports being recently diagnosed with breast cancer.  The history is provided by the patient. No language interpreter was used.  Dizziness Quality:  Lightheadedness Severity:  Mild Onset quality:  Gradual Duration:  1 day Timing:  Constant Progression:  Worsening Chronicity:  New Relieved by:  Nothing Ineffective treatments:  None tried Risk factors: no anemia        Home Medications Prior to Admission medications   Medication Sig Start Date End Date Taking? Authorizing Provider  acetaminophen (TYLENOL) 650 MG CR tablet Take 650 mg by mouth 2 (two) times daily.    [provider]  Biotin w/ Vitamins C & E (HAIR/SKIN/NAILS PO) Take by mouth.    [provider]  calcium-vitamin D (OSCAL WITH D) 500-200 MG-UNIT tablet Take 1 tablet by mouth 2 (two) times daily.    [provider]  clotrimazole-betamethasone (LOTRISONE) cream Apply 1 Application topically 2 (two) times daily. 10/11/22   Kerri Perches, MD  Cod Liver Oil 1000 MG CAPS Take 1 capsule by mouth daily.     [provider]  denosumab (PROLIA) 60 MG/ML SOLN injection Inject 60 mg into the skin every 6 (six) months. Administer in upper arm, thigh, or abdomen    [provider]  docusate sodium (COLACE) 100 MG  capsule Take 100 mg by mouth daily as needed for constipation. For constipation    [provider]  hydrochlorothiazide (MICROZIDE) 12.5 MG capsule TAKE 1 CAPSULE EVERY MORNING 02/01/21   Kerri Perches, MD  latanoprost (XALATAN) 0.005 % ophthalmic solution SMARTSIG:In Eye(s) 07/20/21   [provider]  levothyroxine (SYNTHROID) 100 MCG tablet TAKE 1 TABLET EVERY MORNING 07/24/21   Kerri Perches, MD  lovastatin (MEVACOR) 20 MG tablet TAKE 1 TABLET AT BEDTIME 07/19/22   Kerri Perches, MD  pantoprazole (PROTONIX) 20 MG tablet TAKE 1 TABLET EVERY DAY 11/30/22   Kerri Perches, MD  potassium chloride (KLOR-CON M) 10 MEQ tablet TAKE 1 TABLET TWICE DAILY 12/12/22   Kerri Perches, MD  vitamin E 1000 UNIT capsule Take 1,000 Units by mouth daily.    [provider]  XARELTO 20 MG TABS tablet TAKE 1 TABLET EVERY DAY WITH SUPPER 11/25/22   Doreatha Massed, MD      Allergies    Listerine [antiseptic mouth rinse] and Singulair [montelukast sodium]    Review of Systems   Review of Systems  Neurological:  Positive for dizziness.  All other systems reviewed and are negative.   Physical Exam Updated Vital Signs BP (!) 167/72 (BP Location: Right Arm)   Pulse 75   Temp 97.6 F (36.4 C) (Oral)   Resp 18   Ht 5' (1.524 m)   Wt 81.6 kg   SpO2 99%  BMI 35.15 kg/m  Physical Exam Vitals and nursing note reviewed.  Constitutional:      General: She is not in acute distress.    Appearance: Normal appearance. She is well-developed.  HENT:     Head: Normocephalic and atraumatic.     Nose: Nose normal.     Mouth/Throat:     Mouth: Mucous membranes are moist.  Eyes:     Conjunctiva/sclera: Conjunctivae normal.  Cardiovascular:     Rate and Rhythm: Normal rate and regular rhythm.     Heart sounds: No murmur heard. Pulmonary:     Effort: Pulmonary effort is normal. No respiratory distress.     Breath sounds: Normal breath sounds.  Abdominal:      General: Abdomen is flat.     Palpations: Abdomen is soft.     Tenderness: There is no abdominal tenderness.  Musculoskeletal:        General: No swelling.     Cervical back: Normal range of motion and neck supple.  Skin:    General: Skin is warm and dry.     Capillary Refill: Capillary refill takes less than 2 seconds.  Neurological:     General: No focal deficit present.     Mental Status: She is alert.  Psychiatric:        Mood and Affect: Mood normal.     ED Results / Procedures / Treatments   Labs (all labs ordered are listed, but only abnormal results are displayed) Labs Reviewed  CBC WITH DIFFERENTIAL/PLATELET - Abnormal; Notable for the following components:      Result Value   WBC 3.6 (*)    Platelets 146 (*)    All other components within normal limits  COMPREHENSIVE METABOLIC PANEL - Abnormal; Notable for the following components:   Potassium 3.4 (*)    Calcium 8.8 (*)    Alkaline Phosphatase 33 (*)    All other components within normal limits  URINALYSIS, ROUTINE W REFLEX MICROSCOPIC    EKG EKG Interpretation  Date/Time:  Saturday Dec 29 2022 14:17:50 EDT Ventricular Rate:  77 PR Interval:  314 QRS Duration: 94 QT Interval:  412 QTC Calculation: 467 R Axis:   -14 Text Interpretation: Sinus rhythm Prolonged PR interval COPY Confirmed by Meridee Score [id number]) on 12/29/2022 2:26:16 PM  Radiology CT Head Wo Contrast  Result Date: 12/29/2022 CLINICAL DATA:  Headache and dizziness. EXAM: CT HEAD WITHOUT CONTRAST CT MAXILLOFACIAL WITHOUT CONTRAST TECHNIQUE: Multidetector CT imaging of the head and maxillofacial structures were performed using the standard protocol without intravenous contrast. Multiplanar CT image reconstructions of the maxillofacial structures were also generated. RADIATION DOSE REDUCTION: This exam was performed according to the departmental dose-optimization program which includes automated exposure control, adjustment of the mA and/or kV  according to patient size and/or use of iterative reconstruction technique. COMPARISON:  None Available. FINDINGS: CT HEAD FINDINGS Brain: No evidence of acute infarction, hemorrhage, hydrocephalus, extra-axial collection or mass lesion/mass effect. Small area of encephalomalacia in the right occipital lobe, consistent with old infarct. Vascular: Atherosclerotic vascular calcification of the carotid siphons. No hyperdense vessel. Skull: Normal. Negative for fracture or focal lesion. Other: None. CT MAXILLOFACIAL FINDINGS Osseous: No fracture or mandibular dislocation. No destructive process. Orbits: Negative. No traumatic or inflammatory finding. Sinuses: Clear. Soft tissues: Negative. IMPRESSION: 1. No acute intracranial abnormality. 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 2. No acute maxillofacial fracture. Electronically Signed   By: Obie Dredge M.D.   On: 12/29/2022 14:21   CT Maxillofacial Wo Contrast  Result Date: 12/29/2022 CLINICAL DATA:  Headache and dizziness. EXAM: CT HEAD WITHOUT CONTRAST CT MAXILLOFACIAL WITHOUT CONTRAST TECHNIQUE: Multidetector CT imaging of the head and maxillofacial structures were performed using the standard protocol without intravenous contrast. Multiplanar CT image reconstructions of the maxillofacial structures were also generated. RADIATION DOSE REDUCTION: This exam was performed according to the departmental dose-optimization program which includes automated exposure control, adjustment of the mA and/or kV according to patient size and/or use of iterative reconstruction technique. COMPARISON:  None Available. FINDINGS: CT HEAD FINDINGS Brain: No evidence of acute infarction, hemorrhage, hydrocephalus, extra-axial collection or mass lesion/mass effect. Small area of encephalomalacia in the right occipital lobe, consistent with old infarct. Vascular: Atherosclerotic vascular calcification of the carotid siphons. No hyperdense vessel. Skull: Normal. Negative for fracture or  focal lesion. Other: None. CT MAXILLOFACIAL FINDINGS Osseous: No fracture or mandibular dislocation. No destructive process. Orbits: Negative. No traumatic or inflammatory finding. Sinuses: Clear. Soft tissues: Negative. IMPRESSION: 1. No acute intracranial abnormality. 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 2. No acute maxillofacial fracture. Electronically Signed   By: Obie Dredge M.D.   On: 12/29/2022 14:21    Procedures Procedures    Medications Ordered in ED Medications - No data to display  ED Course/ Medical Decision Making/ A&P                             Medical Decision Making Complains of feeling dizzy and nauseated.  Patient reports symptoms started today.  She has not had any weakness  Amount and/or Complexity of Data Reviewed Independent Historian:     Details: And is here with her son who is supportive External Data Reviewed: notes.    Details: Primary care and oncology notes reviewed.   Labs: ordered. Decision-making details documented in ED Course.    Details: Labs ordered reviewed and interpreted.  Radiology: ordered and independent interpretation performed. Decision-making details documented in ED Course.    Details: T head and maxillofacial showed no acute findings Discussion of management or test interpretation with external provider(s): Advised to see her primary care physician for recheck next week.  Patient advised Tylenol for symptoms.  Risk OTC drugs. Risk Details: Patient counseled on symptoms.  I advised Tylenol for headache.           Final Clinical Impression(s) / ED Diagnoses Final diagnoses:  Dizziness    Rx / DC Orders ED Discharge Orders     None      An After Visit Summary was printed and given to the patient.    Osie Cheeks 12/29/22 1537    Terrilee Files, MD 12/29/22 [phone]

## 2023-01-03 ENCOUNTER — Telehealth: Payer: Self-pay | Admitting: *Deleted

## 2023-01-03 NOTE — Telephone Encounter (Signed)
Transition Care Management Unsuccessful Follow-up Telephone Call  Date of discharge and from where:  12/29/2022  [facility]  Attempts:  2nd Attempt  Reason for unsuccessful TCM follow-up call: Left message

## 2023-01-03 NOTE — Telephone Encounter (Signed)
Transition Care Management Unsuccessful Follow-up Telephone Call  Date of discharge and from where:  [facility] 12/29/2022  Attempts:  1st Attempt  Reason for unsuccessful TCM follow-up call:  Left voice message

## 2023-01-08 ENCOUNTER — Encounter: Payer: Self-pay | Admitting: Internal Medicine

## 2023-01-08 ENCOUNTER — Ambulatory Visit (INDEPENDENT_AMBULATORY_CARE_PROVIDER_SITE_OTHER): Payer: Medicare HMO | Admitting: Internal Medicine

## 2023-01-08 VITALS — BP 128/71 | HR 80 | Resp 16 | Ht 60.0 in | Wt 175.0 lb

## 2023-01-08 DIAGNOSIS — J301 Allergic rhinitis due to pollen: Secondary | ICD-10-CM | POA: Diagnosis not present

## 2023-01-08 DIAGNOSIS — R42 Dizziness and giddiness: Secondary | ICD-10-CM

## 2023-01-08 MED ORDER — CETIRIZINE HCL 10 MG PO TABS
10.0000 mg | ORAL_TABLET | Freq: Every day | ORAL | 0 refills | Status: DC
Start: 2023-01-08 — End: 2023-06-23

## 2023-01-08 MED ORDER — FLUTICASONE PROPIONATE 50 MCG/ACT NA SUSP
2.0000 | Freq: Every day | NASAL | 1 refills | Status: DC
Start: 2023-01-08 — End: 2023-04-23

## 2023-01-08 NOTE — Progress Notes (Signed)
   HPI:Lisa Klein is a 89 y.o. female who presents for ER follow up. On 5/25 she was seen for dizziness. She woke up that morning and had a "woozy feeling". She was able to make it to the door cut off alarm so family could come in and take her to ED. She had vertigo before, but this felt different. The room was not spinning. She has not had any episodes since ED visit.     She can not hear good out of both ears, sounds like she's in a barrel. Worse in right ear. Started after having head cold in March. She had a history of bad allergies, but not currently on allergy medications. No fever or ear pain.  Physical Exam: Vitals:   01/08/23 1447  BP: 128/71  Pulse: 80  Resp: 16  SpO2: 97%  Weight: 175 lb (79.4 kg)  Height: 5' (1.524 m)     Physical Exam Constitutional:      General: She is not in acute distress.    Appearance: She is not toxic-appearing.  HENT:     Right Ear: No decreased hearing noted. No tenderness. A middle ear effusion is present. Tympanic membrane is not erythematous or bulging.     Left Ear: No decreased hearing noted. No tenderness. A middle ear effusion is present. Tympanic membrane is not erythematous or bulging.     Nose: Congestion present.     Mouth/Throat:     Pharynx: No oropharyngeal exudate.  Cardiovascular:     Rate and Rhythm: Normal rate and regular rhythm.     Heart sounds: No murmur heard. Pulmonary:     Effort: Pulmonary effort is normal. No respiratory distress.     Breath sounds: No wheezing.      Assessment & Plan:   Lisa Klein was seen today for er follow up .  Dizziness Assessment & Plan: She had one episode of dizziness which was different than previous vertigo. Possibly transient dehydration on HCTZ, but I am not sure. She has not experienced and episode was 2 weeks ago. Follow up if she has repeat episodes.   Seasonal allergic rhinitis due to pollen Assessment & Plan: Chronic problem. Not taking allergy medication at this  time because she does not go outside often.  Patient has otitis media with effusion. I expect this is from allergic rhinitis and causing the muffled sound patient is experiencing.  Start Zyrtec daily Start Flonase Recommend Saline sinus rinses  Follow up if symptoms worsen or fail to improve    Orders: -     Cetirizine HCl; Take 1 tablet (10 mg total) by mouth daily.  Dispense: 60 tablet; Refill: 0 -     Fluticasone Propionate; Place 2 sprays into both nostrils daily.  Dispense: 15.8 mL; Refill: 1      Milus Banister, MD

## 2023-01-08 NOTE — Patient Instructions (Signed)
Thank you for trusting me with your care. To recap, today we discussed the following:   You are no longer having dizziness.  I believe your allergies are causing your change in hearing. I have prescribed an allergy pill and a nasal spray. Follow up if your symptoms do not improve in a week.

## 2023-01-09 DIAGNOSIS — R42 Dizziness and giddiness: Secondary | ICD-10-CM | POA: Insufficient documentation

## 2023-01-09 NOTE — Assessment & Plan Note (Signed)
Chronic problem. Not taking allergy medication at this time because she does not go outside often.  Patient has otitis media with effusion. I expect this is from allergic rhinitis and causing the muffled sound patient is experiencing.  Start Zyrtec daily Start Flonase Recommend Saline sinus rinses  Follow up if symptoms worsen or fail to improve

## 2023-01-09 NOTE — Assessment & Plan Note (Signed)
She had one episode of dizziness which was different than previous vertigo. Possibly transient dehydration on HCTZ, but I am not sure. She has not experienced and episode was 2 weeks ago. Follow up if she has repeat episodes.

## 2023-02-25 ENCOUNTER — Other Ambulatory Visit: Payer: Self-pay

## 2023-02-25 DIAGNOSIS — C50912 Malignant neoplasm of unspecified site of left female breast: Secondary | ICD-10-CM

## 2023-02-26 ENCOUNTER — Inpatient Hospital Stay: Payer: Medicare HMO | Attending: Hematology

## 2023-02-26 DIAGNOSIS — Z86711 Personal history of pulmonary embolism: Secondary | ICD-10-CM | POA: Insufficient documentation

## 2023-02-26 DIAGNOSIS — Z79899 Other long term (current) drug therapy: Secondary | ICD-10-CM | POA: Diagnosis not present

## 2023-02-26 DIAGNOSIS — Z7901 Long term (current) use of anticoagulants: Secondary | ICD-10-CM | POA: Diagnosis not present

## 2023-02-26 DIAGNOSIS — Z9012 Acquired absence of left breast and nipple: Secondary | ICD-10-CM | POA: Insufficient documentation

## 2023-02-26 DIAGNOSIS — M8580 Other specified disorders of bone density and structure, unspecified site: Secondary | ICD-10-CM | POA: Insufficient documentation

## 2023-02-26 DIAGNOSIS — Z7982 Long term (current) use of aspirin: Secondary | ICD-10-CM | POA: Diagnosis not present

## 2023-02-26 DIAGNOSIS — Z8261 Family history of arthritis: Secondary | ICD-10-CM | POA: Insufficient documentation

## 2023-02-26 DIAGNOSIS — Z923 Personal history of irradiation: Secondary | ICD-10-CM | POA: Insufficient documentation

## 2023-02-26 DIAGNOSIS — Z170 Estrogen receptor positive status [ER+]: Secondary | ICD-10-CM | POA: Insufficient documentation

## 2023-02-26 DIAGNOSIS — C50912 Malignant neoplasm of unspecified site of left female breast: Secondary | ICD-10-CM | POA: Diagnosis not present

## 2023-02-26 DIAGNOSIS — E559 Vitamin D deficiency, unspecified: Secondary | ICD-10-CM | POA: Diagnosis not present

## 2023-02-26 DIAGNOSIS — Z79811 Long term (current) use of aromatase inhibitors: Secondary | ICD-10-CM | POA: Insufficient documentation

## 2023-02-26 DIAGNOSIS — R0602 Shortness of breath: Secondary | ICD-10-CM | POA: Insufficient documentation

## 2023-02-26 DIAGNOSIS — Z86718 Personal history of other venous thrombosis and embolism: Secondary | ICD-10-CM | POA: Diagnosis not present

## 2023-02-26 DIAGNOSIS — Z8249 Family history of ischemic heart disease and other diseases of the circulatory system: Secondary | ICD-10-CM | POA: Insufficient documentation

## 2023-02-26 DIAGNOSIS — K5900 Constipation, unspecified: Secondary | ICD-10-CM | POA: Insufficient documentation

## 2023-02-26 LAB — COMPREHENSIVE METABOLIC PANEL
ALT: 18 U/L (ref 0–44)
AST: 26 U/L (ref 15–41)
Albumin: 3.5 g/dL (ref 3.5–5.0)
Alkaline Phosphatase: 31 U/L — ABNORMAL LOW (ref 38–126)
Anion gap: 9 (ref 5–15)
BUN: 15 mg/dL (ref 8–23)
CO2: 24 mmol/L (ref 22–32)
Calcium: 9.2 mg/dL (ref 8.9–10.3)
Chloride: 106 mmol/L (ref 98–111)
Creatinine, Ser: 0.92 mg/dL (ref 0.44–1.00)
GFR, Estimated: 59 mL/min — ABNORMAL LOW (ref >60)
Glucose, Bld: 93 mg/dL (ref 70–99)
Potassium: 3.6 mmol/L (ref 3.5–5.1)
Sodium: 139 mmol/L (ref 135–145)
Total Bilirubin: 0.9 mg/dL (ref 0.3–1.2)
Total Protein: 6.8 g/dL (ref 6.5–8.1)

## 2023-02-26 LAB — VITAMIN D 25 HYDROXY (VIT D DEFICIENCY, FRACTURES): Vit D, 25-Hydroxy: 56.85 ng/mL (ref 30–100)

## 2023-02-26 LAB — CBC WITH DIFFERENTIAL/PLATELET
Abs Immature Granulocytes: 0.01 10*3/uL (ref 0.00–0.07)
Basophils Absolute: 0.1 10*3/uL (ref 0.0–0.1)
Basophils Relative: 1 %
Eosinophils Absolute: 0.1 10*3/uL (ref 0.0–0.5)
Eosinophils Relative: 1 %
HCT: 38.7 % (ref 36.0–46.0)
Hemoglobin: 12.7 g/dL (ref 12.0–15.0)
Immature Granulocytes: 0 %
Lymphocytes Relative: 34 %
Lymphs Abs: 1.5 10*3/uL (ref 0.7–4.0)
MCH: 30.2 pg (ref 26.0–34.0)
MCHC: 32.8 g/dL (ref 30.0–36.0)
MCV: 92.1 fL (ref 80.0–100.0)
Monocytes Absolute: 0.6 10*3/uL (ref 0.1–1.0)
Monocytes Relative: 13 %
Neutro Abs: 2.2 10*3/uL (ref 1.7–7.7)
Neutrophils Relative %: 51 %
Platelets: 148 10*3/uL — ABNORMAL LOW (ref 150–400)
RBC: 4.2 MIL/uL (ref 3.87–5.11)
RDW: 15.9 % — ABNORMAL HIGH (ref 11.5–15.5)
WBC: 4.3 10*3/uL (ref 4.0–10.5)
nRBC: 0 % (ref 0.0–0.2)

## 2023-02-26 LAB — LACTATE DEHYDROGENASE: LDH: 186 U/L (ref 98–192)

## 2023-03-05 ENCOUNTER — Inpatient Hospital Stay: Payer: Medicare HMO

## 2023-03-05 ENCOUNTER — Inpatient Hospital Stay: Payer: Medicare HMO | Admitting: Oncology

## 2023-03-05 VITALS — BP 131/60 | HR 69 | Temp 97.3°F | Resp 18

## 2023-03-05 VITALS — BP 119/71 | HR 82 | Temp 98.7°F | Resp 19 | Ht 64.0 in | Wt 177.7 lb

## 2023-03-05 DIAGNOSIS — Z86711 Personal history of pulmonary embolism: Secondary | ICD-10-CM | POA: Diagnosis not present

## 2023-03-05 DIAGNOSIS — Z7901 Long term (current) use of anticoagulants: Secondary | ICD-10-CM | POA: Diagnosis not present

## 2023-03-05 DIAGNOSIS — C50912 Malignant neoplasm of unspecified site of left female breast: Secondary | ICD-10-CM

## 2023-03-05 DIAGNOSIS — M8580 Other specified disorders of bone density and structure, unspecified site: Secondary | ICD-10-CM | POA: Diagnosis not present

## 2023-03-05 DIAGNOSIS — K5900 Constipation, unspecified: Secondary | ICD-10-CM | POA: Diagnosis not present

## 2023-03-05 DIAGNOSIS — Z86718 Personal history of other venous thrombosis and embolism: Secondary | ICD-10-CM | POA: Diagnosis not present

## 2023-03-05 DIAGNOSIS — Z79811 Long term (current) use of aromatase inhibitors: Secondary | ICD-10-CM | POA: Diagnosis not present

## 2023-03-05 DIAGNOSIS — Z9012 Acquired absence of left breast and nipple: Secondary | ICD-10-CM | POA: Diagnosis not present

## 2023-03-05 DIAGNOSIS — Z170 Estrogen receptor positive status [ER+]: Secondary | ICD-10-CM | POA: Diagnosis not present

## 2023-03-05 MED ORDER — ZOLEDRONIC ACID 4 MG/100ML IV SOLN
4.0000 mg | Freq: Once | INTRAVENOUS | Status: AC
  Administered 2023-03-05: 4 mg via INTRAVENOUS
  Filled 2023-03-05: qty 100

## 2023-03-05 MED ORDER — SODIUM CHLORIDE 0.9 % IV SOLN: Freq: Once | INTRAVENOUS | Status: AC

## 2023-03-05 NOTE — Progress Notes (Signed)
Patient presents today for Zometa infusion per providers order.  Vital signs and labs reviewed by Durenda Hurt NP.  Message received from Durenda Hurt NP patient okay for treatment.  Peripheral IV started and blood return noted pre and post infusion.  Stable during infusion without adverse affects.  Vital signs stable.  No complaints at this time.  Discharge from clinic ambulatory in stable condition.  Alert and oriented X 3.  Follow up with [facility] as scheduled.

## 2023-03-05 NOTE — Patient Instructions (Signed)
[facility]  Discharge Instructions: Thank you for choosing [facility] to provide your oncology and hematology care.  If you have a lab appointment with the Cancer Center - please note that after April 8th, 2024, all labs will be drawn in the cancer center.  You do not have to check in or register with the main entrance as you have in the past but will complete your check-in in the cancer center.  Wear comfortable clothing and clothing appropriate for easy access to any Portacath or PICC line.   We strive to give you quality time with your provider. You may need to reschedule your appointment if you arrive late (15 or more minutes).  Arriving late affects you and other patients whose appointments are after yours.  Also, if you miss three or more appointments without notifying the office, you may be dismissed from the clinic at the provider's discretion.      For prescription refill requests, have your pharmacy contact our office and allow 72 hours for refills to be completed.    Today you received the following chemotherapy and/or immunotherapy agents Zometa      To help prevent nausea and vomiting after your treatment, we encourage you to take your nausea medication as directed.  BELOW ARE SYMPTOMS THAT SHOULD BE REPORTED IMMEDIATELY: *FEVER GREATER THAN 100.4 F (38 C) OR HIGHER *CHILLS OR SWEATING *NAUSEA AND VOMITING THAT IS NOT CONTROLLED WITH YOUR NAUSEA MEDICATION *UNUSUAL SHORTNESS OF BREATH *UNUSUAL BRUISING OR BLEEDING *URINARY PROBLEMS (pain or burning when urinating, or frequent urination) *BOWEL PROBLEMS (unusual diarrhea, constipation, pain near the anus) TENDERNESS IN MOUTH AND THROAT WITH OR WITHOUT PRESENCE OF ULCERS (sore throat, sores in mouth, or a toothache) UNUSUAL RASH, SWELLING OR PAIN  UNUSUAL VAGINAL DISCHARGE OR ITCHING   Items with * indicate a potential emergency and should be followed up as soon as possible or go to the  Emergency Department if any problems should occur.  Please show the CHEMOTHERAPY ALERT CARD or IMMUNOTHERAPY ALERT CARD at check-in to the Emergency Department and triage nurse.  Should you have questions after your visit or need to cancel or reschedule your appointment, please contact [facility] [phone]  and follow the prompts.  Office hours are 8:00 a.m. to 4:30 p.m. Monday - Friday. Please note that voicemails left after 4:00 p.m. may not be returned until the following business day.  We are closed weekends and major holidays. You have access to a nurse at all times for urgent questions. Please call the main number to the clinic [phone] and follow the prompts.  For any non-urgent questions, you may also contact your provider using MyChart. We now offer e-Visits for anyone 18 and older to request care online for non-urgent symptoms. For details visit [email or web address].   Also download the MyChart app! Go to the app store, search "MyChart", open the app, select [facility], and log in with your MyChart username and password.   

## 2023-03-05 NOTE — Progress Notes (Signed)
[facility] [address] [place] [address]    Clinic Day:  03/05/2023  Referring physician: Kerri Perches, MD  Patient Care Team: Kerri Perches, MD as PCP - Norman Herrlich, MD as Consulting Physician (Oncology) Jacalyn Lefevre, Maurine Minister, PA-C (Inactive) as Physician Assistant (Oncology)   ASSESSMENT & PLAN:   Assessment: 1.  Stage IIIa (T1 cN2 aM0) left breast IDC: -Status post left MRM on 02/14/2011, showing multifocal tumor, largest measuring 1.5 cm, grade 3, deep surgical margins positive, ER/PR positive, HER-2 negative, Ki-67 45%, 6 out of 7 lymph nodes positive.  Status post 6 cycles of epirubicin and Cytoxan, followed by radiation therapy - Anastrozole from 11/19/2011 through April 2023   2.  Osteopenia: -DEXA scan on 08/15/2020 with T-score of -1.3. - Prolia started on 09/01/2014.   3.  Thromboembolism: - She had a right leg DVT in 2012.  Bilateral pulmonary embolism was diagnosed in 2014.  She is currently on Xarelto without any bleeding issues.  Plan:  1.  Stage III left breast IDC, ER/PR positive: - Left mastectomy site is within normal limits. - Right breast mammogram on 08/22/2022, BI-RADS Category 1. - Labs today are grossly normal.  LFTs are normal. - Will get her scheduled for her next mammogram in 6 months.  2.  Osteopenia: - Calcium is 9.2.  She does not have any side effects.   -Proceed with Zometa today.  -RTC 6 months for follow-up.  3.  Recurrent thromboembolism: - Continue Xarelto.  No bleeding issues.  No orders of the defined types were placed in this encounter.   PLAN SUMMARY: >> Mammogram in 6 months. >> Return to clinic after mammogram for lab work, assessment and review mammogram.  >>      Mauro Kaufmann, NP   7/30/202410:57 AM  CHIEF COMPLAINT:   Diagnosis:  infiltrating ductal carcinoma of the left breast    Cancer Staging  Infiltrating ductal carcinoma of breast (HCC) Staging form: Breast,  AJCC 7th Edition - Clinical: Stage IIIA (T1c, N2a, cM0) - Signed by Ellouise Newer, PA on 04/18/2011    Prior Therapy: Arimidex Current Therapy:  Arimidex     HISTORY OF PRESENT ILLNESS:   Oncology History   No history exists.     INTERVAL HISTORY:   Lisa Klein is a 89 y.o. female presenting to clinic today for follow up of  infiltrating ductal carcinoma of the left breast . She was last seen in clinic on 09/18/2022 by Dr. Ellin Saba.  In the interim, she reports she is doing well.  She was evaluated in the emergency room on 12/29/2022 for dizziness.  She was Workup was negative and dizziness was likely secondary to a medication she was taking.  Dizziness has improved some.  Reports some left knee pain which has improved with a cortisone injection.  She uses a cane for ambulation.  She is still able to do all of her activities of daily living at home.  Energy levels are 25%.  Appetite is 100%.  Occasional shortness of breath with exertion.  Has constipation at times.  She is taking Xarelto without any issues. She doesn't hear well out of her left ear due to recent head cold symptoms. She takes calcium with vitamin D.   PAST MEDICAL HISTORY:   Past Medical History: Past Medical History:  Diagnosis Date   Cancer (HCC) 2012   LEFT BREAST   Clotting disorder (HCC) 05/2011   dvt, right leg  Degenerative disc disease    with nerve compression    DJD (degenerative joint disease)    Hypertension    Hypertension    Hypothyroidism    Hypothyroidism    Long term current use of anticoagulant therapy 12/07/2014   Pulmonary embolus    Obesity    Osteopenia 08/24/2014    Surgical History: Past Surgical History:  Procedure Laterality Date   ABDOMINAL HYSTERECTOMY  1980   fibroids   BREAST SURGERY  2012   left total mastectomy   CHOLECYSTECTOMY  80'S   [facility]   CHOLECYSTECTOMY     HIP PINNING,CANNULATED Left 01/19/2013   Procedure: CANNULATED HIP PINNING-  left;  Surgeon: Senaida Lange,  MD;  Location: [facility] OR;  Service: Orthopedics;  Laterality: Left;   MASTECTOMY MODIFIED RADICAL  02/14/11   left   PORT-A-CATH REMOVAL N/A 12/05/2012   Procedure: MINOR REMOVAL PORT-A-CATH;  Surgeon: Dalia Heading, MD;  Location: [facility] ORS;  Service: General;  Laterality: N/A;  In Minor Room   PORTACATH PLACEMENT  04/16/2011   Procedure: INSERTION PORT-A-CATH;  Surgeon: Dalia Heading;  Location: [facility] OR[facility];  Service: General;  Laterality: Right;  right subclavian   VESICOVAGINAL FISTULA CLOSURE W/ TAH     [facility]    Social History: Social History   Socioeconomic History   Marital status: Widowed    Spouse name: Not on file   Number of children: 2   Years of education: 80   Highest education level: 12th grade  Occupational History   Occupation: [employer] part time    Occupation: retired/disabled   Tobacco Use   Smoking status: Never   Smokeless tobacco: Never  Substance and Sexual Activity   Alcohol use: No   Drug use: No   Sexual activity: Not Currently    Birth control/protection: Post-menopausal  Other Topics Concern   Not on file  Social History Narrative   Reports starting to have a lot of trouble doing things for herself. Lives alone, tries to drive to places, but it is getting increasingly harder to do.    Social Determinants of Health   Financial Resource Strain: Low Risk  (07/17/2022)   Overall Financial Resource Strain (CARDIA)    Difficulty of Paying Living Expenses: Not very hard  Food Insecurity: No Food Insecurity (07/17/2022)   Hunger Vital Sign    Worried About Running Out of Food in the Last Year: Never true    Ran Out of Food in the Last Year: Never true  Transportation Needs: No Transportation Needs (07/17/2022)   PRAPARE - Administrator, Civil Service (Medical): No    Lack of Transportation (Non-Medical): No  Physical Activity: Insufficiently Active (07/17/2022)   Exercise Vital Sign    Days of Exercise per Week: 5 days    Minutes of Exercise per Session:  10 min  Stress: No Stress Concern Present (07/17/2022)   [employer] of Occupational Health - Occupational Stress Questionnaire    Feeling of Stress : Not at all  Social Connections: Socially Isolated (07/17/2022)   Social Connection and Isolation Panel [NHANES]    Frequency of Communication with Friends and Family: More than three times a week    Frequency of Social Gatherings with Friends and Family: Three times a week    Attends Religious Services: Never    Active Member of Clubs or Organizations: No    Attends Banker Meetings: Never    Marital Status: Widowed  Intimate Partner Violence: Not At  Risk (07/17/2022)   Humiliation, Afraid, Rape, and Kick questionnaire    Fear of Current or Ex-Partner: No    Emotionally Abused: No    Physically Abused: No    Sexually Abused: No    Family History: Family History  Problem Relation Age of Onset   COPD Father    Lung disease Father    Hypertension Sister    Arthritis Mother    Hypertension Sister    Heart disease Son        stent   Anesthesia problems Neg Hx    Malignant hyperthermia Neg Hx    Hypotension Neg Hx    Pseudochol deficiency Neg Hx     Current Medications:  Current Outpatient Medications:    acetaminophen (TYLENOL) 650 MG CR tablet, Take 650 mg by mouth 2 (two) times daily., Disp: , Rfl:    Biotin w/ Vitamins C & E (HAIR/SKIN/NAILS PO), Take by mouth., Disp: , Rfl:    calcium-vitamin D (OSCAL WITH D) 500-200 MG-UNIT tablet, Take 1 tablet by mouth 2 (two) times daily., Disp: , Rfl:    cetirizine (ZYRTEC ALLERGY) 10 MG tablet, Take 1 tablet (10 mg total) by mouth daily., Disp: 60 tablet, Rfl: 0   clotrimazole-betamethasone (LOTRISONE) cream, Apply 1 Application topically 2 (two) times daily., Disp: 45 g, Rfl: 1   Cod Liver Oil 1000 MG CAPS, Take 1 capsule by mouth daily. , Disp: , Rfl:    denosumab (PROLIA) 60 MG/ML SOLN injection, Inject 60 mg into the skin every 6 (six) months. Administer in upper  arm, thigh, or abdomen, Disp: , Rfl:    docusate sodium (COLACE) 100 MG capsule, Take 100 mg by mouth daily as needed for constipation. For constipation, Disp: , Rfl:    fluticasone (FLONASE) 50 MCG/ACT nasal spray, Place 2 sprays into both nostrils daily., Disp: 15.8 mL, Rfl: 1   hydrochlorothiazide (MICROZIDE) 12.5 MG capsule, TAKE 1 CAPSULE EVERY MORNING, Disp: 90 capsule, Rfl: 2   latanoprost (XALATAN) 0.005 % ophthalmic solution, SMARTSIG:In Eye(s), Disp: , Rfl:    levothyroxine (SYNTHROID) 100 MCG tablet, TAKE 1 TABLET EVERY MORNING, Disp: 90 tablet, Rfl: 0   lovastatin (MEVACOR) 20 MG tablet, TAKE 1 TABLET AT BEDTIME, Disp: 90 tablet, Rfl: 3   pantoprazole (PROTONIX) 20 MG tablet, TAKE 1 TABLET EVERY DAY, Disp: 90 tablet, Rfl: 3   potassium chloride (KLOR-CON M) 10 MEQ tablet, TAKE 1 TABLET TWICE DAILY, Disp: 180 tablet, Rfl: 3   vitamin E 1000 UNIT capsule, Take 1,000 Units by mouth daily., Disp: , Rfl:    XARELTO 20 MG TABS tablet, TAKE 1 TABLET EVERY DAY WITH SUPPER, Disp: 90 tablet, Rfl: 3 No current facility-administered medications for this visit.  Facility-Administered Medications Ordered in Other Visits:    heparin lock flush 100 unit/mL, 500 Units, Intravenous, Once, Kefalas, Maurine Minister, PA-C   heparin lock flush 100 unit/mL, 500 Units, Intravenous, Once, Neijstrom, Eric, MD   sodium chloride 0.9 % injection 10 mL, 10 mL, Intravenous, PRN, Kefalas, Maurine Minister, PA-C   sodium chloride 0.9 % injection 10 mL, 10 mL, Intravenous, PRN, Glenford Peers, MD   Allergies: Allergies  Allergen Reactions   Listerine [Antiseptic Mouth Rinse]     Caused sore throat   Singulair [Montelukast Sodium] Other (See Comments)    Abdominal spasm     REVIEW OF SYSTEMS:   Review of Systems  Constitutional:  Positive for fatigue.  HENT:   Positive for hearing loss.   Respiratory:  Positive for shortness  of breath.   Musculoskeletal:  Positive for arthralgias.  Neurological:  Positive for  dizziness.     VITALS:   There were no vitals taken for this visit.  Wt Readings from Last 3 Encounters:  01/08/23 175 lb (79.4 kg)  12/29/22 180 lb (81.6 kg)  12/03/22 180 lb (81.6 kg)    There is no height or weight on file to calculate BMI.  Performance status (ECOG): 1 - Symptomatic but completely ambulatory  PHYSICAL EXAM:   Physical Exam Constitutional:      Appearance: Normal appearance.  Cardiovascular:     Rate and Rhythm: Normal rate and regular rhythm.  Pulmonary:     Effort: Pulmonary effort is normal.     Breath sounds: Normal breath sounds.  Chest:  Breasts:    Left: Absent.     Comments: Mastectomy site WNL Abdominal:     General: Bowel sounds are normal.     Palpations: Abdomen is soft.  Musculoskeletal:        General: No swelling. Normal range of motion.  Neurological:     Mental Status: She is alert and oriented to person, place, and time. Mental status is at baseline.     LABS:      Latest Ref Rng & Units 02/26/2023    2:38 PM 12/29/2022    1:46 PM 12/11/2022   12:22 PM  CBC  WBC 4.0 - 10.5 K/uL 4.3  3.6  4.3   Hemoglobin 12.0 - 15.0 g/dL 45.4  09.8  11.9   Hematocrit 36.0 - 46.0 % 38.7  39.0  42.3   Platelets 150 - 400 K/uL 148  146  180       Latest Ref Rng & Units 02/26/2023    2:38 PM 12/29/2022    1:46 PM 12/11/2022   12:22 PM  CMP  Glucose 70 - 99 mg/dL 93  97  90   BUN 8 - 23 mg/dL 15  16  17    Creatinine 0.44 - 1.00 mg/dL 1.47  8.29  5.62   Sodium 135 - 145 mmol/L 139  136  135   Potassium 3.5 - 5.1 mmol/L 3.6  3.4  3.8   Chloride 98 - 111 mmol/L 106  101  101   CO2 22 - 32 mmol/L 24  25  25    Calcium 8.9 - 10.3 mg/dL 9.2  8.8  9.1   Total Protein 6.5 - 8.1 g/dL 6.8  6.8  6.9   Total Bilirubin 0.3 - 1.2 mg/dL 0.9  0.9  1.1   Alkaline Phos 38 - 126 U/L 31  33  36   AST 15 - 41 U/L 26  22  21    ALT 0 - 44 U/L 18  20  19       Lab Results  Component Value Date   CEA 1.0 07/21/2014   /  CEA  Date Value Ref Range Status   07/21/2014 1.0 0.0 - 5.0 ng/mL Final    Comment:    Performed at [facility]   No results found for: "PSA1" No results found for: "CAN199" No results found for: "CAN125"  No results found for: "TOTALPROTELP", "ALBUMINELP", "A1GS", "A2GS", "BETS", "BETA2SER", "GAMS", "MSPIKE", "SPEI" Lab Results  Component Value Date   TIBC 375 03/08/2020   FERRITIN 16 03/08/2020   IRONPCTSAT 30 03/08/2020   Lab Results  Component Value Date   LDH 186 02/26/2023   LDH 155 09/18/2022   LDH 170 03/15/2022  STUDIES:   No results found.

## 2023-03-13 ENCOUNTER — Encounter: Payer: Self-pay | Admitting: Family Medicine

## 2023-03-14 ENCOUNTER — Telehealth: Payer: Self-pay | Admitting: Family Medicine

## 2023-03-14 NOTE — Telephone Encounter (Signed)
Pls contact pharmacy and document when pt last filled synthroid and hydrochlorothiazide, also pls order fasting lipid,cmp and EGFR and TSH , then contact via pt message / call daughter [name] who sent message re meds, thanks

## 2023-03-15 ENCOUNTER — Other Ambulatory Visit: Payer: Self-pay

## 2023-03-15 DIAGNOSIS — E7849 Other hyperlipidemia: Secondary | ICD-10-CM

## 2023-03-15 DIAGNOSIS — E039 Hypothyroidism, unspecified: Secondary | ICD-10-CM

## 2023-03-15 NOTE — Telephone Encounter (Signed)
Labs ordered lvm for patients daughter

## 2023-04-02 DIAGNOSIS — E7849 Other hyperlipidemia: Secondary | ICD-10-CM | POA: Diagnosis not present

## 2023-04-02 DIAGNOSIS — E039 Hypothyroidism, unspecified: Secondary | ICD-10-CM | POA: Diagnosis not present

## 2023-04-03 MED ORDER — LEVOTHYROXINE SODIUM 50 MCG PO TABS: 50.0000 ug | ORAL_TABLET | Freq: Every day | ORAL | 3 refills | Status: DC

## 2023-04-03 NOTE — Addendum Note (Signed)
Addended by: Kerri Perches on: 04/03/2023 06:21 AM   Modules accepted: Orders

## 2023-04-04 ENCOUNTER — Other Ambulatory Visit: Payer: Self-pay

## 2023-04-04 ENCOUNTER — Telehealth: Payer: Self-pay | Admitting: Family Medicine

## 2023-04-04 MED ORDER — HYDROCHLOROTHIAZIDE 12.5 MG PO CAPS: 12.5000 mg | ORAL_CAPSULE | Freq: Every morning | ORAL | 2 refills | Status: DC

## 2023-04-04 NOTE — Telephone Encounter (Signed)
Daughter aware medication sent to [facility] for blood pressure

## 2023-04-04 NOTE — Telephone Encounter (Signed)
Patient family member kelly called sent in a RX for thyroid medicine but the pharmacy did not received the blood pressure medicine and now having lower leg swelling.

## 2023-04-04 NOTE — Telephone Encounter (Signed)
Refill sent daughter aware

## 2023-04-08 NOTE — Telephone Encounter (Signed)
Will address further at visit

## 2023-04-23 ENCOUNTER — Encounter: Payer: Self-pay | Admitting: Family Medicine

## 2023-04-23 ENCOUNTER — Ambulatory Visit (INDEPENDENT_AMBULATORY_CARE_PROVIDER_SITE_OTHER): Payer: Medicare HMO | Admitting: Family Medicine

## 2023-04-23 VITALS — BP 134/83 | HR 94 | Ht 64.0 in | Wt 178.0 lb

## 2023-04-23 DIAGNOSIS — E7849 Other hyperlipidemia: Secondary | ICD-10-CM | POA: Diagnosis not present

## 2023-04-23 DIAGNOSIS — Z23 Encounter for immunization: Secondary | ICD-10-CM | POA: Diagnosis not present

## 2023-04-23 DIAGNOSIS — Z0001 Encounter for general adult medical examination with abnormal findings: Secondary | ICD-10-CM

## 2023-04-23 DIAGNOSIS — E039 Hypothyroidism, unspecified: Secondary | ICD-10-CM | POA: Diagnosis not present

## 2023-04-23 DIAGNOSIS — I10 Essential (primary) hypertension: Secondary | ICD-10-CM

## 2023-04-23 DIAGNOSIS — J301 Allergic rhinitis due to pollen: Secondary | ICD-10-CM | POA: Diagnosis not present

## 2023-04-23 MED ORDER — FLUTICASONE PROPIONATE 50 MCG/ACT NA SUSP
2.0000 | Freq: Every day | NASAL | 3 refills | Status: DC
Start: 2023-04-23 — End: 2023-09-12

## 2023-04-23 NOTE — Progress Notes (Signed)
Lisa Klein     MRN: [id number]      DOB: May 27, 1930  Chief Complaint  Patient presents with   Annual Exam    CPE patient reports cold last week runny nose used allergy medication seems to have gone away    HPI: Patient is in for annual physical exam. Allergy symptoms uncontrolled last week, better now Immunization is reviewed , and  updated    PE: BP 134/83 (BP Location: Right Arm, Patient Position: Sitting, Cuff Size: Large)   Pulse 94   Ht 5\' 4"  (1.626 m)   Wt 178 lb 0.6 oz (80.8 kg)   SpO2 97%   BMI 30.56 kg/m   Pleasant  female, alert and oriented x 3, in no cardio-pulmonary distress. Afebrile. HEENT No facial trauma or asymetry. Sinuses non tender.  Extra occullar muscles intact.. External ears normal, . Neck: decreased ROM, no adenopathy,JVD or thyromegaly.No bruits.  Chest: Clear to ascultation bilaterally.No crackles or wheezes. Non tender to palpation    Cardiovascular system; Heart sounds normal,  S1 and  S2 ,no S3.  No murmur  Peripheral pulses normal.  Abdomen: Soft, non tender,  Musculoskeletal exam: Decreased  ROM of spine, hips , shoulders and knees.  deformity ,swelling and  crepitus noted. No muscle wasting or atrophy.   Neurologic: Cranial nerves 2 to 12 intact. Power, tone ,sensation and reflexes normal throughout. No disturbance in gait. No tremor.  Skin: Intact, no ulceration, erythema , scaling or rash noted. Pigmentation normal throughout  Psych; Normal mood and affect. Judgement and concentration normal   Assessment & Plan:  Encounter for annual general medical examination with abnormal findings in adult Annual exam as documented. Immunization and cancer screening needs are specifically addressed at this visit.

## 2023-04-23 NOTE — Assessment & Plan Note (Signed)
Annual exam as documented. . Immunization and cancer screening needs are specifically addressed at this visit.

## 2023-04-23 NOTE — Patient Instructions (Addendum)
F/u with medications first week in January, call if you need me sooner  Flu vaccine today  Please get covid vaccine  in October   Fasting lipid, cmp and eGFR, TSH last  week in December  Nose spray sent for allergies   Thanks for choosing [facility], we consider it a privelige to serve you.

## 2023-05-08 ENCOUNTER — Other Ambulatory Visit: Payer: Self-pay | Admitting: Family Medicine

## 2023-05-31 DIAGNOSIS — H401233 Low-tension glaucoma, bilateral, severe stage: Secondary | ICD-10-CM | POA: Diagnosis not present

## 2023-06-18 ENCOUNTER — Other Ambulatory Visit: Payer: Self-pay

## 2023-06-18 MED ORDER — LEVOTHYROXINE SODIUM 50 MCG PO TABS: 50.0000 ug | ORAL_TABLET | Freq: Every day | ORAL | 3 refills | Status: DC

## 2023-06-23 ENCOUNTER — Encounter (HOSPITAL_COMMUNITY): Payer: Self-pay

## 2023-06-23 ENCOUNTER — Emergency Department (HOSPITAL_COMMUNITY): Payer: Medicare HMO

## 2023-06-23 ENCOUNTER — Inpatient Hospital Stay (HOSPITAL_COMMUNITY)
Admission: EM | Admit: 2023-06-23 | Discharge: 2023-07-02 | DRG: 871 | Disposition: A | Discharge status: Home Health | Payer: Medicare HMO | Attending: Internal Medicine | Admitting: Internal Medicine

## 2023-06-23 ENCOUNTER — Other Ambulatory Visit: Payer: Self-pay

## 2023-06-23 DIAGNOSIS — D709 Neutropenia, unspecified: Secondary | ICD-10-CM

## 2023-06-23 DIAGNOSIS — I110 Hypertensive heart disease with heart failure: Secondary | ICD-10-CM | POA: Diagnosis present

## 2023-06-23 DIAGNOSIS — I2699 Other pulmonary embolism without acute cor pulmonale: Secondary | ICD-10-CM | POA: Diagnosis present

## 2023-06-23 DIAGNOSIS — R000 Tachycardia, unspecified: Secondary | ICD-10-CM | POA: Diagnosis not present

## 2023-06-23 DIAGNOSIS — Z79890 Hormone replacement therapy (postmenopausal): Secondary | ICD-10-CM

## 2023-06-23 DIAGNOSIS — E669 Obesity, unspecified: Secondary | ICD-10-CM | POA: Diagnosis present

## 2023-06-23 DIAGNOSIS — D696 Thrombocytopenia, unspecified: Secondary | ICD-10-CM | POA: Diagnosis not present

## 2023-06-23 DIAGNOSIS — A419 Sepsis, unspecified organism: Secondary | ICD-10-CM | POA: Diagnosis not present

## 2023-06-23 DIAGNOSIS — E871 Hypo-osmolality and hyponatremia: Secondary | ICD-10-CM | POA: Diagnosis not present

## 2023-06-23 DIAGNOSIS — E441 Mild protein-calorie malnutrition: Secondary | ICD-10-CM | POA: Diagnosis present

## 2023-06-23 DIAGNOSIS — E8809 Other disorders of plasma-protein metabolism, not elsewhere classified: Secondary | ICD-10-CM | POA: Diagnosis present

## 2023-06-23 DIAGNOSIS — D61818 Other pancytopenia: Secondary | ICD-10-CM | POA: Diagnosis present

## 2023-06-23 DIAGNOSIS — D703 Neutropenia due to infection: Secondary | ICD-10-CM | POA: Diagnosis present

## 2023-06-23 DIAGNOSIS — Z888 Allergy status to other drugs, medicaments and biological substances status: Secondary | ICD-10-CM

## 2023-06-23 DIAGNOSIS — I4891 Unspecified atrial fibrillation: Secondary | ICD-10-CM | POA: Diagnosis not present

## 2023-06-23 DIAGNOSIS — C50919 Malignant neoplasm of unspecified site of unspecified female breast: Secondary | ICD-10-CM | POA: Diagnosis present

## 2023-06-23 DIAGNOSIS — R059 Cough, unspecified: Secondary | ICD-10-CM | POA: Diagnosis not present

## 2023-06-23 DIAGNOSIS — R6510 Systemic inflammatory response syndrome (SIRS) of non-infectious origin without acute organ dysfunction: Secondary | ICD-10-CM | POA: Diagnosis not present

## 2023-06-23 DIAGNOSIS — Z86711 Personal history of pulmonary embolism: Secondary | ICD-10-CM

## 2023-06-23 DIAGNOSIS — R54 Age-related physical debility: Secondary | ICD-10-CM | POA: Diagnosis present

## 2023-06-23 DIAGNOSIS — Z853 Personal history of malignant neoplasm of breast: Secondary | ICD-10-CM

## 2023-06-23 DIAGNOSIS — R0600 Dyspnea, unspecified: Secondary | ICD-10-CM | POA: Diagnosis not present

## 2023-06-23 DIAGNOSIS — E876 Hypokalemia: Secondary | ICD-10-CM | POA: Diagnosis present

## 2023-06-23 DIAGNOSIS — J690 Pneumonitis due to inhalation of food and vomit: Secondary | ICD-10-CM | POA: Diagnosis present

## 2023-06-23 DIAGNOSIS — E039 Hypothyroidism, unspecified: Secondary | ICD-10-CM | POA: Diagnosis not present

## 2023-06-23 DIAGNOSIS — J9601 Acute respiratory failure with hypoxia: Secondary | ICD-10-CM | POA: Diagnosis not present

## 2023-06-23 DIAGNOSIS — Z9221 Personal history of antineoplastic chemotherapy: Secondary | ICD-10-CM

## 2023-06-23 DIAGNOSIS — Z1152 Encounter for screening for COVID-19: Secondary | ICD-10-CM | POA: Diagnosis not present

## 2023-06-23 DIAGNOSIS — C9200 Acute myeloblastic leukemia, not having achieved remission: Secondary | ICD-10-CM | POA: Diagnosis present

## 2023-06-23 DIAGNOSIS — T45515A Adverse effect of anticoagulants, initial encounter: Secondary | ICD-10-CM | POA: Diagnosis present

## 2023-06-23 DIAGNOSIS — K219 Gastro-esophageal reflux disease without esophagitis: Secondary | ICD-10-CM | POA: Diagnosis present

## 2023-06-23 DIAGNOSIS — Z7189 Other specified counseling: Secondary | ICD-10-CM | POA: Diagnosis not present

## 2023-06-23 DIAGNOSIS — E782 Mixed hyperlipidemia: Secondary | ICD-10-CM | POA: Diagnosis not present

## 2023-06-23 DIAGNOSIS — Z66 Do not resuscitate: Secondary | ICD-10-CM | POA: Diagnosis present

## 2023-06-23 DIAGNOSIS — R5081 Fever presenting with conditions classified elsewhere: Secondary | ICD-10-CM | POA: Diagnosis present

## 2023-06-23 DIAGNOSIS — J168 Pneumonia due to other specified infectious organisms: Secondary | ICD-10-CM | POA: Diagnosis not present

## 2023-06-23 DIAGNOSIS — Z515 Encounter for palliative care: Secondary | ICD-10-CM

## 2023-06-23 DIAGNOSIS — J189 Pneumonia, unspecified organism: Principal | ICD-10-CM

## 2023-06-23 DIAGNOSIS — N179 Acute kidney failure, unspecified: Secondary | ICD-10-CM | POA: Diagnosis not present

## 2023-06-23 DIAGNOSIS — I82409 Acute embolism and thrombosis of unspecified deep veins of unspecified lower extremity: Secondary | ICD-10-CM | POA: Diagnosis present

## 2023-06-23 DIAGNOSIS — Z9010 Acquired absence of unspecified breast and nipple: Secondary | ICD-10-CM

## 2023-06-23 DIAGNOSIS — Z86718 Personal history of other venous thrombosis and embolism: Secondary | ICD-10-CM

## 2023-06-23 DIAGNOSIS — Z6831 Body mass index (BMI) 31.0-31.9, adult: Secondary | ICD-10-CM | POA: Diagnosis not present

## 2023-06-23 DIAGNOSIS — I10 Essential (primary) hypertension: Secondary | ICD-10-CM

## 2023-06-23 DIAGNOSIS — Z8249 Family history of ischemic heart disease and other diseases of the circulatory system: Secondary | ICD-10-CM

## 2023-06-23 DIAGNOSIS — E46 Unspecified protein-calorie malnutrition: Secondary | ICD-10-CM | POA: Diagnosis not present

## 2023-06-23 DIAGNOSIS — Z79899 Other long term (current) drug therapy: Secondary | ICD-10-CM

## 2023-06-23 DIAGNOSIS — Z825 Family history of asthma and other chronic lower respiratory diseases: Secondary | ICD-10-CM

## 2023-06-23 DIAGNOSIS — Z7901 Long term (current) use of anticoagulants: Secondary | ICD-10-CM

## 2023-06-23 DIAGNOSIS — I5023 Acute on chronic systolic (congestive) heart failure: Secondary | ICD-10-CM | POA: Diagnosis not present

## 2023-06-23 DIAGNOSIS — R7989 Other specified abnormal findings of blood chemistry: Secondary | ICD-10-CM | POA: Diagnosis not present

## 2023-06-23 DIAGNOSIS — Z8261 Family history of arthritis: Secondary | ICD-10-CM

## 2023-06-23 DIAGNOSIS — R509 Fever, unspecified: Secondary | ICD-10-CM | POA: Diagnosis not present

## 2023-06-23 DIAGNOSIS — Z7401 Bed confinement status: Secondary | ICD-10-CM | POA: Diagnosis not present

## 2023-06-23 LAB — COMPREHENSIVE METABOLIC PANEL
ALT: 42 U/L (ref 0–44)
AST: 51 U/L — ABNORMAL HIGH (ref 15–41)
Albumin: 3.1 g/dL — ABNORMAL LOW (ref 3.5–5.0)
Alkaline Phosphatase: 51 U/L (ref 38–126)
Anion gap: 13 (ref 5–15)
BUN: 16 mg/dL (ref 8–23)
CO2: 22 mmol/L (ref 22–32)
Calcium: 8.7 mg/dL — ABNORMAL LOW (ref 8.9–10.3)
Chloride: 99 mmol/L (ref 98–111)
Creatinine, Ser: 1.1 mg/dL — ABNORMAL HIGH (ref 0.44–1.00)
GFR, Estimated: 48 mL/min — ABNORMAL LOW (ref >60)
Glucose, Bld: 104 mg/dL — ABNORMAL HIGH (ref 70–99)
Potassium: 3.8 mmol/L (ref 3.5–5.1)
Sodium: 134 mmol/L — ABNORMAL LOW (ref 135–145)
Total Bilirubin: 1.3 mg/dL — ABNORMAL HIGH (ref <1.2)
Total Protein: 6.9 g/dL (ref 6.5–8.1)

## 2023-06-23 LAB — CBC WITH DIFFERENTIAL/PLATELET
Abs Immature Granulocytes: 0.16 10*3/uL — ABNORMAL HIGH (ref 0.00–0.07)
Basophils Absolute: 0 10*3/uL (ref 0.0–0.1)
Basophils Relative: 1 %
Eosinophils Absolute: 0 10*3/uL (ref 0.0–0.5)
Eosinophils Relative: 0 %
HCT: 34.9 % — ABNORMAL LOW (ref 36.0–46.0)
Hemoglobin: 11.5 g/dL — ABNORMAL LOW (ref 12.0–15.0)
Immature Granulocytes: 9 %
Lymphocytes Relative: 31 %
Lymphs Abs: 0.6 10*3/uL — ABNORMAL LOW (ref 0.7–4.0)
MCH: 30.5 pg (ref 26.0–34.0)
MCHC: 33 g/dL (ref 30.0–36.0)
MCV: 92.6 fL (ref 80.0–100.0)
Monocytes Absolute: 0.5 10*3/uL (ref 0.1–1.0)
Monocytes Relative: 28 %
Neutro Abs: 0.6 10*3/uL — ABNORMAL LOW (ref 1.7–7.7)
Neutrophils Relative %: 31 %
Platelets: 63 10*3/uL — ABNORMAL LOW (ref 150–400)
RBC: 3.77 MIL/uL — ABNORMAL LOW (ref 3.87–5.11)
RDW: 17.9 % — ABNORMAL HIGH (ref 11.5–15.5)
WBC: 1.8 10*3/uL — ABNORMAL LOW (ref 4.0–10.5)
nRBC: 0 % (ref 0.0–0.2)

## 2023-06-23 LAB — MAGNESIUM: Magnesium: 1.6 mg/dL — ABNORMAL LOW (ref 1.7–2.4)

## 2023-06-23 LAB — SARS CORONAVIRUS 2 BY RT PCR: SARS Coronavirus 2 by RT PCR: NEGATIVE

## 2023-06-23 LAB — LACTIC ACID, PLASMA: Lactic Acid, Venous: 1.7 mmol/L (ref 0.5–1.9)

## 2023-06-23 MED ORDER — ACETAMINOPHEN 325 MG PO TABS
650.0000 mg | ORAL_TABLET | Freq: Four times a day (QID) | ORAL | Status: DC | PRN
Start: 2023-06-23 — End: 2023-06-29
  Administered 2023-06-24 – 2023-06-27 (×5): 650 mg via ORAL
  Filled 2023-06-23 (×6): qty 2

## 2023-06-23 MED ORDER — DEXTROSE 5 % IV SOLN
500.0000 mg | INTRAVENOUS | Status: DC
  Administered 2023-06-24: 500 mg via INTRAVENOUS
  Filled 2023-06-23: qty 5

## 2023-06-23 MED ORDER — ENOXAPARIN SODIUM 40 MG/0.4ML IJ SOSY: 40.0000 mg | PREFILLED_SYRINGE | INTRAMUSCULAR | Status: DC

## 2023-06-23 MED ORDER — DEXTROSE 5 % IV SOLN
500.0000 mg | INTRAVENOUS | Status: AC
  Administered 2023-06-23: 500 mg via INTRAVENOUS
  Filled 2023-06-23: qty 5

## 2023-06-23 MED ORDER — ONDANSETRON HCL 4 MG/2ML IJ SOLN
4.0000 mg | Freq: Four times a day (QID) | INTRAMUSCULAR | Status: DC | PRN
  Administered 2023-06-24 – 2023-06-26 (×3): 4 mg via INTRAVENOUS
  Filled 2023-06-23 (×3): qty 2

## 2023-06-23 MED ORDER — ONDANSETRON HCL 4 MG PO TABS: 4.0000 mg | ORAL_TABLET | Freq: Four times a day (QID) | ORAL | Status: DC | PRN

## 2023-06-23 MED ORDER — ACETAMINOPHEN 650 MG RE SUPP: 650.0000 mg | Freq: Four times a day (QID) | RECTAL | Status: DC | PRN

## 2023-06-23 MED ORDER — ENSURE ENLIVE PO LIQD
237.0000 mL | Freq: Two times a day (BID) | ORAL | Status: DC
  Administered 2023-06-24: 237 mL via ORAL

## 2023-06-23 MED ORDER — SODIUM CHLORIDE 0.9 % IV SOLN: 3.0000 g | Freq: Four times a day (QID) | INTRAVENOUS | Status: DC

## 2023-06-23 MED ORDER — SODIUM CHLORIDE 0.9 % IV SOLN
1.5000 g | Freq: Once | INTRAVENOUS | Status: AC
  Administered 2023-06-23: 1.5 g via INTRAVENOUS
  Filled 2023-06-23 (×2): qty 4

## 2023-06-23 MED ORDER — DM-GUAIFENESIN ER 30-600 MG PO TB12
1.0000 | ORAL_TABLET | Freq: Two times a day (BID) | ORAL | Status: DC
  Administered 2023-06-24 – 2023-07-02 (×18): 1 via ORAL
  Filled 2023-06-23 (×18): qty 1

## 2023-06-23 MED ORDER — MAGNESIUM SULFATE 2 GM/50ML IV SOLN
2.0000 g | Freq: Once | INTRAVENOUS | Status: AC
  Administered 2023-06-24: 2 g via INTRAVENOUS
  Filled 2023-06-23: qty 50

## 2023-06-23 NOTE — ED Triage Notes (Addendum)
Pt c/o of fever, and chills. Pt was seen at the urgent care and was informed to come to the hospital they diagnosed her with pneumonia. Per sister they informed pt that it was up to her to come to the hospital. Pt states they gave her some kind of shot in her hip while at the UC. Per sister pt just had a covid vaccination on Tuesday causing her severe neck pain.

## 2023-06-23 NOTE — ED Notes (Addendum)
EKG obtained due to pulse reading 35, during EKG 102.

## 2023-06-23 NOTE — ED Provider Notes (Signed)
[facility]ERGENCY DEPARTMENT AT [facility] Provider Note   CSN: [id number] Arrival date & time: 06/23/23  1701     History  Chief Complaint  Patient presents with   Fever    Lisa Klein is a 89 y.o. female.   Fever  This patient is a 89 year old female, she has a history of acid reflux, she is on Xarelto, she denies having diabetes or having heart disease, she has been having fevers and chills for a couple of days with some bodyaches after getting the COVID shot a couple of days ago at Dr. Anthony Sar office.  Today she went to the urgent care and was told that she had an pneumonia on her x-ray, was told that because of her age she needed to go to the emergency department.  She denies diarrhea but did have an episode of vomiting last night.    Home Medications Prior to Admission medications   Medication Sig Start Date End Date Taking? Authorizing Provider  acetaminophen (TYLENOL) 650 MG CR tablet Take 650 mg by mouth 2 (two) times daily.   Yes [provider]  Biotin w/ Vitamins C & E (HAIR/SKIN/NAILS PO) Take by mouth.   Yes [provider]  calcium-vitamin D (OSCAL WITH D) 500-200 MG-UNIT tablet Take 1 tablet by mouth 2 (two) times daily.   Yes [provider]  clotrimazole-betamethasone (LOTRISONE) cream Apply 1 Application topically 2 (two) times daily. 10/11/22  Yes Kerri Perches, MD  Cod Liver Oil 1000 MG CAPS Take 1 capsule by mouth daily.    Yes [provider]  denosumab (PROLIA) 60 MG/ML SOLN injection Inject 60 mg into the skin every 6 (six) months. Administer in upper arm, thigh, or abdomen   Yes [provider]  docusate sodium (COLACE) 100 MG capsule Take 100 mg by mouth daily as needed for constipation. For constipation   Yes [provider]  fluticasone (FLONASE) 50 MCG/ACT nasal spray Place 2 sprays into both nostrils daily. Patient taking differently: Place 2 sprays into both nostrils daily  as needed for allergies. 04/23/23  Yes Kerri Perches, MD  hydrochlorothiazide (MICROZIDE) 12.5 MG capsule Take 1 capsule (12.5 mg total) by mouth every morning. 04/04/23  Yes Kerri Perches, MD  latanoprost (XALATAN) 0.005 % ophthalmic solution Place 1 drop into both eyes at bedtime. 07/20/21  Yes [provider]  levothyroxine (SYNTHROID) 50 MCG tablet Take 1 tablet (50 mcg total) by mouth daily. 06/18/23  Yes Kerri Perches, MD  lovastatin (MEVACOR) 20 MG tablet TAKE 1 TABLET AT BEDTIME 05/08/23  Yes Kerri Perches, MD  pantoprazole (PROTONIX) 20 MG tablet TAKE 1 TABLET EVERY DAY 11/30/22  Yes Kerri Perches, MD  potassium chloride (KLOR-CON M) 10 MEQ tablet TAKE 1 TABLET TWICE DAILY 12/12/22  Yes Kerri Perches, MD  vitamin E 1000 UNIT capsule Take 1,000 Units by mouth daily.   Yes [provider]  XARELTO 20 MG TABS tablet TAKE 1 TABLET EVERY DAY WITH SUPPER 11/25/22  Yes Doreatha Massed, MD      Allergies    Listerine [antiseptic mouth rinse] and Singulair [montelukast sodium]    Review of Systems   Review of Systems  Constitutional:  Positive for fever.  All other systems reviewed and are negative.   Physical Exam Updated Vital Signs BP 110/70   Pulse 81   Temp (!) 100.9 F (38.3 C) (Rectal)   Resp 19   Ht 1.626 m (5\' 4" )  Wt 80.8 kg   SpO2 96%   BMI 30.58 kg/m  Physical Exam Vitals and nursing note reviewed.  Constitutional:      General: She is not in acute distress.    Appearance: She is well-developed.  HENT:     Head: Normocephalic and atraumatic.     Mouth/Throat:     Pharynx: No oropharyngeal exudate.  Eyes:     General: No scleral icterus.       Right eye: No discharge.        Left eye: No discharge.     Conjunctiva/sclera: Conjunctivae normal.     Pupils: Pupils are equal, round, and reactive to light.  Neck:     Thyroid: No thyromegaly.     Vascular: No JVD.  Cardiovascular:     Rate and Rhythm: Regular  rhythm. Tachycardia present.     Heart sounds: Normal heart sounds. No murmur heard.    No friction rub. No gallop.  Pulmonary:     Effort: Pulmonary effort is normal. No respiratory distress.     Breath sounds: Normal breath sounds. No wheezing or rales.  Abdominal:     General: Bowel sounds are normal. There is no distension.     Palpations: Abdomen is soft. There is no mass.     Tenderness: There is no abdominal tenderness.  Musculoskeletal:        General: No tenderness. Normal range of motion.     Cervical back: Normal range of motion and neck supple.     Right lower leg: No edema.     Left lower leg: No edema.  Lymphadenopathy:     Cervical: No cervical adenopathy.  Skin:    General: Skin is warm and dry.     Findings: No erythema or rash.  Neurological:     Mental Status: She is alert.     Coordination: Coordination normal.  Psychiatric:        Behavior: Behavior normal.     ED Results / Procedures / Treatments   Labs (all labs ordered are listed, but only abnormal results are displayed) Labs Reviewed  CBC WITH DIFFERENTIAL/PLATELET - Abnormal; Notable for the following components:      Result Value   WBC 1.8 (*)    RBC 3.77 (*)    Hemoglobin 11.5 (*)    HCT 34.9 (*)    RDW 17.9 (*)    Platelets 63 (*)    Neutro Abs 0.6 (*)    Lymphs Abs 0.6 (*)    Abs Immature Granulocytes 0.16 (*)    All other components within normal limits  COMPREHENSIVE METABOLIC PANEL - Abnormal; Notable for the following components:   Sodium 134 (*)    Glucose, Bld 104 (*)    Creatinine, Ser 1.10 (*)    Calcium 8.7 (*)    Albumin 3.1 (*)    AST 51 (*)    Total Bilirubin 1.3 (*)    GFR, Estimated 48 (*)    All other components within normal limits  MAGNESIUM - Abnormal; Notable for the following components:   Magnesium 1.6 (*)    All other components within normal limits  SARS CORONAVIRUS 2 BY RT PCR  CULTURE, BLOOD (ROUTINE X 2)  CULTURE, BLOOD (ROUTINE X 2)  LACTIC ACID,  PLASMA  URINALYSIS, ROUTINE W REFLEX MICROSCOPIC    EKG EKG Interpretation Date/Time:  Sunday June 23 2023 17:21:11 EST Ventricular Rate:  102 PR Interval:  192 QRS Duration:  90 QT Interval:  364  QTC Calculation: 474 R Axis:   -16  Text Interpretation: Sinus tachycardia with Premature atrial complexes Moderate voltage criteria for LVH, may be normal variant ( R in aVL , Cornell product ) Borderline ECG When compared with ECG of 29-Dec-2022 14:17, PREVIOUS ECG IS PRESENT LVH not seen to this degree on prior EKG Confirmed by Eber Hong ([id number]) on 06/23/2023 5:29:06 PM  Radiology DG Chest Port 1 View  Result Date: 06/23/2023 CLINICAL DATA:  Cough and fever. EXAM: PORTABLE CHEST 1 VIEW COMPARISON:  March 22, 2021 FINDINGS: Cardiomediastinal silhouette is normal. Mediastinal contours appear intact. There is no evidence of focal airspace consolidation, pleural effusion or pneumothorax. Osseous structures are without acute abnormality. Left chest wall postsurgical changes stable. IMPRESSION: No active disease. Electronically Signed   By: Ted Mcalpine M.D.   On: 06/23/2023 18:20    Procedures .Critical Care  Performed by: Eber Hong, MD Authorized by: Eber Hong, MD   Critical care provider statement:    Critical care time (minutes):  45   Critical care time was exclusive of:  Separately billable procedures and treating other patients and teaching time   Critical care was necessary to treat or prevent imminent or life-threatening deterioration of the following conditions:  Sepsis   Critical care was time spent personally by me on the following activities:  Development of treatment plan with patient or surrogate, discussions with consultants, evaluation of patient's response to treatment, examination of patient, obtaining history from patient or surrogate, review of old charts, re-evaluation of patient's condition, pulse oximetry, ordering and review of radiographic  studies, ordering and review of laboratory studies and ordering and performing treatments and interventions   I assumed direction of critical care for this patient from another provider in my specialty: no     Care discussed with: admitting provider   Comments:           Medications Ordered in ED Medications  ampicillin-sulbactam (UNASYN) 1.5 g in sodium chloride 0.9 % 100 mL IVPB (has no administration in time range)  azithromycin (ZITHROMAX) 500 mg in dextrose 5 % 250 mL IVPB (has no administration in time range)    ED Course/ Medical Decision Making/ A&P                                 Medical Decision Making Amount and/or Complexity of Data Reviewed Labs: ordered. Radiology: ordered.  Risk Decision regarding hospitalization.    This patient presents to the ED for concern of fever with possible pneumonia, this involves an extensive number of treatment options, and is a complaint that carries with it a high risk of complications and morbidity.  The differential diagnosis includes pneumonia, sepsis, aspiration, reaction to vaccine   Co morbidities that complicate the patient evaluation  Elderly   Additional history obtained:  Additional history obtained from medical record External records from outside source obtained and reviewed including office visits - no recent admissions.  F/u with cardiology    Lab Tests:  I Ordered, and personally interpreted labs.  The pertinent results include: CBC shows leukopenia and in calculation she actually has moderate neutropenia, metabolic panel was okay   Imaging Studies ordered:  I ordered imaging studies including chest x-ray I independently visualized and interpreted imaging which showed no obvious infiltrates I agree with the radiologist interpretation   Cardiac Monitoring: / EKG:  The patient was maintained on a cardiac monitor.  I personally viewed and  interpreted the cardiac monitored which showed an underlying  rhythm of: Sinus tachycardia   Consultations Obtained:  I requested consultation with the hospitalist Dr. Thomes Dinning,  and discussed lab and imaging findings as well as pertinent plan - they recommend: Admission to the hospital, suspect that this could have been a aspiration event after having some vomiting, Unasyn and Zithromax added   Problem List / ED Course / Critical interventions / Medication management  Patient given antibiotics, may need some oxygen due to some transient tachypnea and hypoxia, fever was down to 100.9 as it was 103 prehospital at the urgent care. I ordered medication including antibiotics for infection Reevaluation of the patient after these medicines showed that the patient improved The patient does have what appears to be a neutropenic fever with a possible respiratory source, she will need to be admitted to the hospital overnight as she could be critically ill I have reviewed the patients home medicines and have made adjustments as needed   Social Determinants of Health:  None   Test / Admission - Considered:  Admit         Final Clinical Impression(s) / ED Diagnoses Final diagnoses:  Pneumonia due to infectious organism, unspecified laterality, unspecified part of lung  Neutropenic fever (HCC)    Rx / DC Orders ED Discharge Orders     None         Eber Hong, MD 06/23/23 2124

## 2023-06-23 NOTE — Progress Notes (Signed)
PHARMACY NOTE:  ANTIMICROBIAL RENAL DOSAGE ADJUSTMENT  Current antimicrobial regimen includes a mismatch between antimicrobial dosage and estimated renal function.  As per policy approved by the Pharmacy & Therapeutics and Medical Executive Committees, the antimicrobial dosage will be adjusted accordingly.  Current antimicrobial dosage: Unasyn 3g IV q6h  Indication: Pneumonia  Renal Function:  Estimated Creatinine Clearance: 34.6 mL/min (A) (by C-G formula based on SCr of 1.1 mg/dL (H)).      Antimicrobial dosage has been changed to:  Unasyn 3gm IV q8h   Thank you for allowing pharmacy to be a part of this patient's care.  Maryellen Pile, [facility] 06/23/2023 11:58 PM

## 2023-06-23 NOTE — ED Notes (Signed)
Fever at UC was 103, down after Tylenol

## 2023-06-23 NOTE — H&P (Incomplete)
History and Physical    Patient: Lisa Klein:[id number] DOB: 20-Aug-1929 DOA: 06/23/2023 DOS: the patient was seen and examined on 06/23/2023 PCP: Kerri Perches, MD  Patient coming from: Home  Chief Complaint:  Chief Complaint  Patient presents with  . Fever   HPI: Lisa Klein is a 89 y.o. female with medical history significant of hypertension, hyperlipidemia, hypothyroidism, GERD, right leg DVT and pulmonary embolus on Xarelto, stage III(T1 cN2 aM0) left breast IDC follows with Lisa Kaufmann, NP.  She presents to the ED from an urgent care.  Patient complained of 2-day onset of fever, chills, body aches which started after getting COVID shot a few days ago at Dr. Anthony Sar office.  She endorsed vomiting last night.  Patient went to an urgent care today and was told that she had a pneumonia on her chest x-ray, she was advised to go to the ED for further evaluation and management.  Patient lives alone and was able to take care of her ADLs.  She ambulates with a cane and walker.  ED Course:  In the emergency department, temperature on arrival was 99.5F, though patient eventually became febrile few hours after arrival to the ED with a temperature of 100.5F, respiratory rate was 18/min, pulse 35 bpm, BP was 102/76, O2 sat was 97% on room air.  Workup in the ED showed leukopenia, normocytic anemia, thrombocytopenia.  BMP was normal except for sodium of 134, blood glucose 104, creatinine 1.10, albumin 3.1, AST 51, total bilirubin 1.3, GFR 48, magnesium 1.6.  SARS coronavirus 2 was negative, blood culture pending Chest x-ray showed no active disease Patient was treated with IV Unasyn and azithromycin Hospitalist was asked to admit patient for further evaluation and management.  Review of Systems: Review of systems as noted in the HPI. All other systems reviewed and are negative.   Past Medical History:  Diagnosis Date  . Cancer (HCC) 2012   LEFT BREAST  . Clotting  disorder (HCC) 05/2011   dvt, right leg  . Degenerative disc disease    with nerve compression   . DJD (degenerative joint disease)   . Hypertension   . Hypertension   . Hypothyroidism   . Hypothyroidism   . Long term current use of anticoagulant therapy 12/07/2014   Pulmonary embolus   . Obesity   . Osteopenia 08/24/2014   Past Surgical History:  Procedure Laterality Date  . ABDOMINAL HYSTERECTOMY  1980   fibroids  . BREAST SURGERY  2012   left total mastectomy  . CHOLECYSTECTOMY  80'S   [facility]  . CHOLECYSTECTOMY    . HIP PINNING,CANNULATED Left 01/19/2013   Procedure: CANNULATED HIP PINNING-  left;  Surgeon: Senaida Lange, MD;  Location: [facility] OR;  Service: Orthopedics;  Laterality: Left;  [name] MASTECTOMY MODIFIED RADICAL  02/14/11   left  . PORT-A-CATH REMOVAL N/A 12/05/2012   Procedure: MINOR REMOVAL PORT-A-CATH;  Surgeon: Dalia Heading, MD;  Location: [facility];  Service: General;  Laterality: N/A;  In Minor Room  . PORTACATH PLACEMENT  04/16/2011   Procedure: INSERTION PORT-A-CATH;  Surgeon: Dalia Heading;  Location: [facility];  Service: General;  Laterality: Right;  right subclavian  . VESICOVAGINAL FISTULA CLOSURE W/ TAH     [facility]    Social History:  reports that she has never smoked. She has never used smokeless tobacco. She reports that she does not drink alcohol and does not use drugs.   Allergies  Allergen Reactions  . Listerine [  Antiseptic Mouth Rinse] Other (See Comments)    Caused sore throat  . Singulair [Montelukast Sodium] Other (See Comments)    Abdominal spasm     Family History  Problem Relation Age of Onset  . COPD Father   . Lung disease Father   . Hypertension Sister   . Arthritis Mother   . Hypertension Sister   . Heart disease Son        stent  . Anesthesia problems Neg Hx   . Malignant hyperthermia Neg Hx   . Hypotension Neg Hx   . Pseudochol deficiency Neg Hx     ***  Prior to Admission medications   Medication Sig Start Date End Date Taking?  Authorizing Provider  acetaminophen (TYLENOL) 650 MG CR tablet Take 650 mg by mouth 2 (two) times daily.   Yes [provider]  Biotin w/ Vitamins C & E (HAIR/SKIN/NAILS PO) Take by mouth.   Yes [provider]  calcium-vitamin D (OSCAL WITH D) 500-200 MG-UNIT tablet Take 1 tablet by mouth 2 (two) times daily.   Yes [provider]  clotrimazole-betamethasone (LOTRISONE) cream Apply 1 Application topically 2 (two) times daily. 10/11/22  Yes Kerri Perches, MD  Cod Liver Oil 1000 MG CAPS Take 1 capsule by mouth daily.    Yes [provider]  denosumab (PROLIA) 60 MG/ML SOLN injection Inject 60 mg into the skin every 6 (six) months. Administer in upper arm, thigh, or abdomen   Yes [provider]  docusate sodium (COLACE) 100 MG capsule Take 100 mg by mouth daily as needed for constipation. For constipation   Yes [provider]  fluticasone (FLONASE) 50 MCG/ACT nasal spray Place 2 sprays into both nostrils daily. Patient taking differently: Place 2 sprays into both nostrils daily as needed for allergies. 04/23/23  Yes Kerri Perches, MD  hydrochlorothiazide (MICROZIDE) 12.5 MG capsule Take 1 capsule (12.5 mg total) by mouth every morning. 04/04/23  Yes Kerri Perches, MD  latanoprost (XALATAN) 0.005 % ophthalmic solution Place 1 drop into both eyes at bedtime. 07/20/21  Yes [provider]  levothyroxine (SYNTHROID) 50 MCG tablet Take 1 tablet (50 mcg total) by mouth daily. 06/18/23  Yes Kerri Perches, MD  lovastatin (MEVACOR) 20 MG tablet TAKE 1 TABLET AT BEDTIME 05/08/23  Yes Kerri Perches, MD  pantoprazole (PROTONIX) 20 MG tablet TAKE 1 TABLET EVERY DAY 11/30/22  Yes Kerri Perches, MD  potassium chloride (KLOR-CON M) 10 MEQ tablet TAKE 1 TABLET TWICE DAILY 12/12/22  Yes Kerri Perches, MD  vitamin E 1000 UNIT capsule Take 1,000 Units by mouth daily.   Yes [provider]  XARELTO 20 MG TABS tablet  TAKE 1 TABLET EVERY DAY WITH SUPPER 11/25/22  Yes Doreatha Massed, MD    Physical Exam: BP 123/76 (BP Location: Right Arm)   Pulse (!) 106   Temp 98.8 F (37.1 C) (Oral)   Resp (!) 26   Ht 5\' 4"  (1.626 m)   Wt 78.9 kg   SpO2 100%   BMI 29.87 kg/m   General: 89 y.o. year-old female well developed well nourished in no acute distress.  Alert and oriented x3. HEENT: NCAT, EOMI Neck: Supple, trachea medial Cardiovascular: Tachycardia.  Regular rate and rhythm with no rubs or gallops.  No thyromegaly or JVD noted.  No lower extremity edema. 2/4 pulses in all 4 extremities. Respiratory: Tachypnea.  Clear to auscultation with no wheezes or rales. Good inspiratory effort. Abdomen: Soft, nontender  nondistended with normal bowel sounds x4 quadrants. Muskuloskeletal: No cyanosis, clubbing or edema noted bilaterally Neuro: CN II-XII intact, strength 5/5 x 4, sensation, reflexes intact Skin: No ulcerative lesions noted or rashes Psychiatry: Judgement and insight appear normal. Mood is appropriate for condition and setting          Labs on Admission:  Basic Metabolic Panel: Recent Labs  Lab 06/23/23 1759  NA 134*  K 3.8  CL 99  CO2 22  GLUCOSE 104*  BUN 16  CREATININE 1.10*  CALCIUM 8.7*  MG 1.6*   Liver Function Tests: Recent Labs  Lab 06/23/23 1759  AST 51*  ALT 42  ALKPHOS 51  BILITOT 1.3*  PROT 6.9  ALBUMIN 3.1*   No results for input(s): "LIPASE", "AMYLASE" in the last 168 hours. No results for input(s): "AMMONIA" in the last 168 hours. CBC: Recent Labs  Lab 06/23/23 1759  WBC 1.8*  NEUTROABS 0.6*  HGB 11.5*  HCT 34.9*  MCV 92.6  PLT 63*   Cardiac Enzymes: No results for input(s): "CKTOTAL", "CKMB", "CKMBINDEX", "TROPONINI" in the last 168 hours.  BNP (last 3 results) No results for input(s): "BNP" in the last 8760 hours.  ProBNP (last 3 results) No results for input(s): "PROBNP" in the last 8760 hours.  CBG: No results for input(s): "GLUCAP" in  the last 168 hours.  Radiological Exams on Admission: DG Chest Port 1 View  Result Date: 06/23/2023 CLINICAL DATA:  Cough and fever. EXAM: PORTABLE CHEST 1 VIEW COMPARISON:  March 22, 2021 FINDINGS: Cardiomediastinal silhouette is normal. Mediastinal contours appear intact. There is no evidence of focal airspace consolidation, pleural effusion or pneumothorax. Osseous structures are without acute abnormality. Left chest wall postsurgical changes stable. IMPRESSION: No active disease. Electronically Signed   By: Ted Mcalpine M.D.   On: 06/23/2023 18:20    EKG: I independently viewed the EKG done and my findings are as followed: Sinus tachycardia at a rate of 102 bpm with PACs  Assessment/Plan Present on Admission: . Aspiration pneumonia (HCC)  Principal Problem:   Aspiration pneumonia (HCC)   Sepsis secondary to CAP POA Patient meets sepsis criteria due to being febrile, leukopenic, tachypneic and tachycardic with source of infection being pneumonia.  This may be due to community-acquired pneumonia/aspiration pneumonia due to recent vomiting that could have resulted in aspiration. PORT/PSI of 8.2 points  indicating 8.2-9.3% mortality Continue Mucinex, Unasyn, azithromycin Continue  incentive spirometry, flutter valve  Blood culture and sputum culture pending  Thrombocytopenia possibly reactive Platelets 63, there was no obvious bleeding Last chemotherapy infusion was on March 05, 2023 Continue to monitor platelet levels  Hypomagnesemia Magnesium 1.6, this will be replenished  Hypoalbuminemia possibly secondary to mild protein calorie malnutrition Albumin 3.1, protein supplement will be provided  Essential hypertension Mixed hyperlipidemia Acquired hypothyroidism GERD Right leg DVT and pulmonary embolus on Xarelto Stage III(T1 cN2 aM0) left breast IDC   DVT prophylaxis: ***   Code Status: ***   Family Communication: ***   Disposition Plan: ***   Consults  called: ***   Admission status: ***     Frankey Shown MD [facility] Pager [phone]  If 7PM-7AM, please contact night-coverage [email or web address] Password [facility]  06/23/2023, 11:21 PM      Review of Systems: {ROS_Text:26778} Past Medical History:  Diagnosis Date  . Cancer (HCC) 2012   LEFT BREAST  . Clotting disorder (HCC) 05/2011   dvt, right leg  . Degenerative disc disease    with nerve compression   .  DJD (degenerative joint disease)   . Hypertension   . Hypertension   . Hypothyroidism   . Hypothyroidism   . Long term current use of anticoagulant therapy 12/07/2014   Pulmonary embolus   . Obesity   . Osteopenia 08/24/2014   Past Surgical History:  Procedure Laterality Date  . ABDOMINAL HYSTERECTOMY  1980   fibroids  . BREAST SURGERY  2012   left total mastectomy  . CHOLECYSTECTOMY  80'S   [facility]  . CHOLECYSTECTOMY    . HIP PINNING,CANNULATED Left 01/19/2013   Procedure: CANNULATED HIP PINNING-  left;  Surgeon: Senaida Lange, MD;  Location: [facility] OR;  Service: Orthopedics;  Laterality: Left;  [name] MASTECTOMY MODIFIED RADICAL  02/14/11   left  . PORT-A-CATH REMOVAL N/A 12/05/2012   Procedure: MINOR REMOVAL PORT-A-CATH;  Surgeon: Dalia Heading, MD;  Location: [facility];  Service: General;  Laterality: N/A;  In Minor Room  . PORTACATH PLACEMENT  04/16/2011   Procedure: INSERTION PORT-A-CATH;  Surgeon: Dalia Heading;  Location: [facility];  Service: General;  Laterality: Right;  right subclavian  . VESICOVAGINAL FISTULA CLOSURE W/ TAH     [facility]   Social History:  reports that she has never smoked. She has never used smokeless tobacco. She reports that she does not drink alcohol and does not use drugs.  Allergies  Allergen Reactions  . Listerine [Antiseptic Mouth Rinse] Other (See Comments)    Caused sore throat  . Singulair [Montelukast Sodium] Other (See Comments)    Abdominal spasm     Family History  Problem Relation Age of Onset  . COPD Father   . Lung disease  Father   . Hypertension Sister   . Arthritis Mother   . Hypertension Sister   . Heart disease Son        stent  . Anesthesia problems Neg Hx   . Malignant hyperthermia Neg Hx   . Hypotension Neg Hx   . Pseudochol deficiency Neg Hx     Prior to Admission medications   Medication Sig Start Date End Date Taking? Authorizing Provider  acetaminophen (TYLENOL) 650 MG CR tablet Take 650 mg by mouth 2 (two) times daily.   Yes [provider]  Biotin w/ Vitamins C & E (HAIR/SKIN/NAILS PO) Take by mouth.   Yes [provider]  calcium-vitamin D (OSCAL WITH D) 500-200 MG-UNIT tablet Take 1 tablet by mouth 2 (two) times daily.   Yes [provider]  clotrimazole-betamethasone (LOTRISONE) cream Apply 1 Application topically 2 (two) times daily. 10/11/22  Yes Kerri Perches, MD  Cod Liver Oil 1000 MG CAPS Take 1 capsule by mouth daily.    Yes [provider]  denosumab (PROLIA) 60 MG/ML SOLN injection Inject 60 mg into the skin every 6 (six) months. Administer in upper arm, thigh, or abdomen   Yes [provider]  docusate sodium (COLACE) 100 MG capsule Take 100 mg by mouth daily as needed for constipation. For constipation   Yes [provider]  fluticasone (FLONASE) 50 MCG/ACT nasal spray Place 2 sprays into both nostrils daily. Patient taking differently: Place 2 sprays into both nostrils daily as needed for allergies. 04/23/23  Yes Kerri Perches, MD  hydrochlorothiazide (MICROZIDE) 12.5 MG capsule Take 1 capsule (12.5 mg total) by mouth every morning. 04/04/23  Yes Kerri Perches, MD  latanoprost (XALATAN) 0.005 % ophthalmic solution Place 1 drop into both eyes at bedtime. 07/20/21  Yes [provider]  levothyroxine (SYNTHROID) 50  MCG tablet Take 1 tablet (50 mcg total) by mouth daily. 06/18/23  Yes Kerri Perches, MD  lovastatin (MEVACOR) 20 MG tablet TAKE 1 TABLET AT BEDTIME 05/08/23  Yes Kerri Perches, MD   pantoprazole (PROTONIX) 20 MG tablet TAKE 1 TABLET EVERY DAY 11/30/22  Yes Kerri Perches, MD  potassium chloride (KLOR-CON M) 10 MEQ tablet TAKE 1 TABLET TWICE DAILY 12/12/22  Yes Kerri Perches, MD  vitamin E 1000 UNIT capsule Take 1,000 Units by mouth daily.   Yes [provider]  XARELTO 20 MG TABS tablet TAKE 1 TABLET EVERY DAY WITH SUPPER 11/25/22  Yes Doreatha Massed, MD    Physical Exam: Vitals:   06/23/23 1900 06/23/23 2000 06/23/23 2015 06/23/23 2030  BP: (!) 123/108 118/76 (!) 110/91 110/70  Pulse: 91 (!) 101 96 81  Resp: 16 (!) 27 (!) 24 19  Temp:  98.5 F (36.9 C)  (!) 100.9 F (38.3 C)  TempSrc:  Oral  Rectal  SpO2: 96% 100% 99% 96%  Weight:      Height:       *** Data Reviewed: {Tip this will not be part of the note when signed- Document your independent interpretation of telemetry tracing, EKG, lab, Radiology test or any other diagnostic tests. Add any new diagnostic test ordered today. (Optional):26781} {Results:26384}  Assessment and Plan: No notes have been filed under this hospital service. Service: Hospitalist     Advance Care Planning:   Code Status: Prior ***  Consults: ***  Family Communication: ***  Severity of Illness: {Observation/Inpatient:21159}  Author: Frankey Shown, DO 06/23/2023 9:22 PM  For on call review [email or web address].

## 2023-06-23 NOTE — H&P (Signed)
History and Physical    Patient: Lisa Klein ZOX:[id number] DOB: [birth year] DOA: 06/23/2023 DOS: the patient was seen and examined on 06/24/2023 PCP: Kerri Perches, MD  Patient coming from: Home  Chief Complaint:  Chief Complaint  Patient presents with   Fever   HPI: Lisa Klein is a 89 y.o. female with medical history significant of hypertension, hyperlipidemia, hypothyroidism, GERD, right leg DVT and pulmonary embolus on Xarelto, stage III(T1 cN2 aM0) left breast IDC follows with Mauro Kaufmann, NP.  She presents to the ED from an urgent care.  Patient complained of 2-day onset of fever, chills, body aches which started after getting COVID shot a few days ago at Dr. Anthony Sar office.  She endorsed vomiting last night.  Patient went to an urgent care today and was told that she had a pneumonia on her chest x-ray, she was advised to go to the ED for further evaluation and management.  Patient lives alone and was able to take care of her ADLs.  She ambulates with a cane and walker.  ED Course:  In the emergency department, temperature on arrival was 99.63F, though patient eventually became febrile few hours after arrival to the ED with a temperature of 100.28F, respiratory rate was 18/min, pulse 35 bpm, BP was 102/76, O2 sat was 97% on room air.  Workup in the ED showed leukopenia, normocytic anemia, thrombocytopenia.  BMP was normal except for sodium of 134, blood glucose 104, creatinine 1.10, albumin 3.1, AST 51, total bilirubin 1.3, GFR 48, magnesium 1.6.  SARS coronavirus 2 was negative, blood culture pending Chest x-ray showed no active disease Patient was treated with IV Unasyn and azithromycin Hospitalist was asked to admit patient for further evaluation and management.  Review of Systems: Review of systems as noted in the HPI. All other systems reviewed and are negative.   Past Medical History:  Diagnosis Date   Cancer (HCC) 2012   LEFT BREAST   Clotting  disorder (HCC) 05/2011   dvt, right leg   Degenerative disc disease    with nerve compression    DJD (degenerative joint disease)    Hypertension    Hypertension    Hypothyroidism    Hypothyroidism    Long term current use of anticoagulant therapy 12/07/2014   Pulmonary embolus    Obesity    Osteopenia 08/24/2014   Past Surgical History:  Procedure Laterality Date   ABDOMINAL HYSTERECTOMY  1980   fibroids   BREAST SURGERY  2012   left total mastectomy   CHOLECYSTECTOMY  80'S   [facility]   CHOLECYSTECTOMY     HIP PINNING,CANNULATED Left 01/19/2013   Procedure: CANNULATED HIP PINNING-  left;  Surgeon: Senaida Lange, MD;  Location: [facility] OR;  Service: Orthopedics;  Laterality: Left;   MASTECTOMY MODIFIED RADICAL  02/14/11   left   PORT-A-CATH REMOVAL N/A 12/05/2012   Procedure: MINOR REMOVAL PORT-A-CATH;  Surgeon: Dalia Heading, MD;  Location: [facility]S;  Service: General;  Laterality: N/A;  In Minor Room   PORTACATH PLACEMENT  04/16/2011   Procedure: INSERTION PORT-A-CATH;  Surgeon: Dalia Heading;  Location: [facility];  Service: General;  Laterality: Right;  right subclavian   VESICOVAGINAL FISTULA CLOSURE W/ TAH     [facility]    Social History:  reports that she has never smoked. She has never used smokeless tobacco. She reports that she does not drink alcohol and does not use drugs.   Allergies  Allergen Reactions   Listerine [  Antiseptic Mouth Rinse] Other (See Comments)    Caused sore throat   Singulair [Montelukast Sodium] Other (See Comments)    Abdominal spasm     Family History  Problem Relation Age of Onset   COPD Father    Lung disease Father    Hypertension Sister    Arthritis Mother    Hypertension Sister    Heart disease Son        stent   Anesthesia problems Neg Hx    Malignant hyperthermia Neg Hx    Hypotension Neg Hx    Pseudochol deficiency Neg Hx      Prior to Admission medications   Medication Sig Start Date End Date Taking? Authorizing Provider  acetaminophen  (TYLENOL) 650 MG CR tablet Take 650 mg by mouth 2 (two) times daily.   Yes [provider]  Biotin w/ Vitamins C & E (HAIR/SKIN/NAILS PO) Take by mouth.   Yes [provider]  calcium-vitamin D (OSCAL WITH D) 500-200 MG-UNIT tablet Take 1 tablet by mouth 2 (two) times daily.   Yes [provider]  clotrimazole-betamethasone (LOTRISONE) cream Apply 1 Application topically 2 (two) times daily. 10/11/22  Yes Kerri Perches, MD  Cod Liver Oil 1000 MG CAPS Take 1 capsule by mouth daily.    Yes [provider]  denosumab (PROLIA) 60 MG/ML SOLN injection Inject 60 mg into the skin every 6 (six) months. Administer in upper arm, thigh, or abdomen   Yes [provider]  docusate sodium (COLACE) 100 MG capsule Take 100 mg by mouth daily as needed for constipation. For constipation   Yes [provider]  fluticasone (FLONASE) 50 MCG/ACT nasal spray Place 2 sprays into both nostrils daily. Patient taking differently: Place 2 sprays into both nostrils daily as needed for allergies. 04/23/23  Yes Kerri Perches, MD  hydrochlorothiazide (MICROZIDE) 12.5 MG capsule Take 1 capsule (12.5 mg total) by mouth every morning. 04/04/23  Yes Kerri Perches, MD  latanoprost (XALATAN) 0.005 % ophthalmic solution Place 1 drop into both eyes at bedtime. 07/20/21  Yes [provider]  levothyroxine (SYNTHROID) 50 MCG tablet Take 1 tablet (50 mcg total) by mouth daily. 06/18/23  Yes Kerri Perches, MD  lovastatin (MEVACOR) 20 MG tablet TAKE 1 TABLET AT BEDTIME 05/08/23  Yes Kerri Perches, MD  pantoprazole (PROTONIX) 20 MG tablet TAKE 1 TABLET EVERY DAY 11/30/22  Yes Kerri Perches, MD  potassium chloride (KLOR-CON M) 10 MEQ tablet TAKE 1 TABLET TWICE DAILY 12/12/22  Yes Kerri Perches, MD  vitamin E 1000 UNIT capsule Take 1,000 Units by mouth daily.   Yes [provider]  XARELTO 20 MG TABS tablet TAKE 1 TABLET EVERY DAY WITH SUPPER  11/25/22  Yes Doreatha Massed, MD    Physical Exam: BP 123/76 (BP Location: Right Arm)   Pulse (!) 106   Temp 98.8 F (37.1 C) (Oral)   Resp (!) 26   Ht 5\' 4"  (1.626 m)   Wt 78.9 kg   SpO2 100%   BMI 29.87 kg/m   General: 89 y.o. year-old female well developed well nourished in no acute distress.  Alert and oriented x3. HEENT: NCAT, EOMI Neck: Supple, trachea medial Cardiovascular: Tachycardia.  Regular rate and rhythm with no rubs or gallops.  No thyromegaly or JVD noted.  No lower extremity edema. 2/4 pulses in all 4 extremities. Respiratory: Tachypnea.  Clear to auscultation with no wheezes or rales. Good inspiratory effort. Abdomen: Soft, nontender nondistended  with normal bowel sounds x4 quadrants. Muskuloskeletal: No cyanosis, clubbing or edema noted bilaterally Neuro: CN II-XII intact, strength 5/5 x 4, sensation, reflexes intact Skin: No ulcerative lesions noted or rashes Psychiatry: Judgement and insight appear normal. Mood is appropriate for condition and setting          Labs on Admission:  Basic Metabolic Panel: Recent Labs  Lab 06/23/23 1759  NA 134*  K 3.8  CL 99  CO2 22  GLUCOSE 104*  BUN 16  CREATININE 1.10*  CALCIUM 8.7*  MG 1.6*   Liver Function Tests: Recent Labs  Lab 06/23/23 1759  AST 51*  ALT 42  ALKPHOS 51  BILITOT 1.3*  PROT 6.9  ALBUMIN 3.1*   No results for input(s): "LIPASE", "AMYLASE" in the last 168 hours. No results for input(s): "AMMONIA" in the last 168 hours. CBC: Recent Labs  Lab 06/23/23 1759  WBC 1.8*  NEUTROABS 0.6*  HGB 11.5*  HCT 34.9*  MCV 92.6  PLT 63*   Cardiac Enzymes: No results for input(s): "CKTOTAL", "CKMB", "CKMBINDEX", "TROPONINI" in the last 168 hours.  BNP (last 3 results) No results for input(s): "BNP" in the last 8760 hours.  ProBNP (last 3 results) No results for input(s): "PROBNP" in the last 8760 hours.  CBG: No results for input(s): "GLUCAP" in the last 168 hours.  Radiological  Exams on Admission: DG Chest Port 1 View  Result Date: 06/23/2023 CLINICAL DATA:  Cough and fever. EXAM: PORTABLE CHEST 1 VIEW COMPARISON:  March 22, 2021 FINDINGS: Cardiomediastinal silhouette is normal. Mediastinal contours appear intact. There is no evidence of focal airspace consolidation, pleural effusion or pneumothorax. Osseous structures are without acute abnormality. Left chest wall postsurgical changes stable. IMPRESSION: No active disease. Electronically Signed   By: Ted Mcalpine M.D.   On: 06/23/2023 18:20    EKG: I independently viewed the EKG done and my findings are as followed: Sinus tachycardia at a rate of 102 bpm with PACs  Assessment/Plan Present on Admission:  Essential hypertension  Mixed hyperlipidemia  Acquired hypothyroidism  GERD (gastroesophageal reflux disease)  Pulmonary embolism (HCC)  Infiltrating ductal carcinoma of breast (HCC)  Principal Problem:   Sepsis (HCC) Active Problems:   Pulmonary embolism (HCC)   Acquired hypothyroidism   Mixed hyperlipidemia   Essential hypertension   Infiltrating ductal carcinoma of breast (HCC)   GERD (gastroesophageal reflux disease)   Thrombocytopenia (HCC)   CAP (community acquired pneumonia)   Hypomagnesemia   Hypoalbuminemia due to protein-calorie malnutrition (HCC)  Sepsis secondary to CAP POA Patient meets sepsis criteria due to being febrile, leukopenic, tachypneic and tachycardic with source of infection being pneumonia.  This may be due to community-acquired pneumonia/aspiration pneumonia due to recent vomiting that could have resulted in aspiration. PORT/PSI of 8.2 points  indicating 8.2-9.3% mortality Continue Mucinex, Unasyn, azithromycin Continue  incentive spirometry, flutter valve  Blood culture and sputum culture pending  Thrombocytopenia possibly reactive Platelets 63, there was no obvious bleeding Last chemotherapy infusion was on March 05, 2023 Continue to monitor platelet  levels  Hypomagnesemia Magnesium 1.6, this will be replenished  Hypoalbuminemia possibly secondary to mild protein calorie malnutrition Albumin 3.1, protein supplement will be provided  Essential hypertension (controlled) Continue HCTZ  Mixed hyperlipidemia Continue pravastatin  Acquired hypothyroidism Continue Synthroid  GERD Continue Protonix  Right leg DVT and pulmonary embolus Continue on Xarelto  Stage III(T1 cN2 aM0) left breast IDC  She is status post left modified radical mastectomy on 02/14/2011 She was treated with anastrozole  from 11/19/2011 through April 2023   DVT prophylaxis: Xarelto  Code Status: Full code  Family Communication: None at bedside  Consults: None  Severity of Illness: The appropriate patient status for this patient is INPATIENT. Inpatient status is judged to be reasonable and necessary in order to provide the required intensity of service to ensure the patient's safety. The patient's presenting symptoms, physical exam findings, and initial radiographic and laboratory data in the context of their chronic comorbidities is felt to place them at high risk for further clinical deterioration. Furthermore, it is not anticipated that the patient will be medically stable for discharge from the hospital within 2 midnights of admission.   * I certify that at the point of admission it is my clinical judgment that the patient will require inpatient hospital care spanning beyond 2 midnights from the point of admission due to high intensity of service, high risk for further deterioration and high frequency of surveillance required.*  Author: Frankey Shown, DO 06/24/2023 12:38 AM  For on call review [email or web address].

## 2023-06-24 DIAGNOSIS — R6510 Systemic inflammatory response syndrome (SIRS) of non-infectious origin without acute organ dysfunction: Secondary | ICD-10-CM | POA: Diagnosis not present

## 2023-06-24 DIAGNOSIS — C50919 Malignant neoplasm of unspecified site of unspecified female breast: Secondary | ICD-10-CM | POA: Diagnosis not present

## 2023-06-24 DIAGNOSIS — D696 Thrombocytopenia, unspecified: Secondary | ICD-10-CM | POA: Insufficient documentation

## 2023-06-24 DIAGNOSIS — E8809 Other disorders of plasma-protein metabolism, not elsewhere classified: Secondary | ICD-10-CM | POA: Insufficient documentation

## 2023-06-24 DIAGNOSIS — E46 Unspecified protein-calorie malnutrition: Secondary | ICD-10-CM | POA: Insufficient documentation

## 2023-06-24 DIAGNOSIS — I82409 Acute embolism and thrombosis of unspecified deep veins of unspecified lower extremity: Secondary | ICD-10-CM | POA: Diagnosis present

## 2023-06-24 DIAGNOSIS — I2699 Other pulmonary embolism without acute cor pulmonale: Secondary | ICD-10-CM

## 2023-06-24 DIAGNOSIS — J189 Pneumonia, unspecified organism: Secondary | ICD-10-CM | POA: Insufficient documentation

## 2023-06-24 DIAGNOSIS — I10 Essential (primary) hypertension: Secondary | ICD-10-CM | POA: Diagnosis not present

## 2023-06-24 DIAGNOSIS — A419 Sepsis, unspecified organism: Secondary | ICD-10-CM

## 2023-06-24 DIAGNOSIS — E039 Hypothyroidism, unspecified: Secondary | ICD-10-CM | POA: Diagnosis not present

## 2023-06-24 LAB — URINALYSIS, ROUTINE W REFLEX MICROSCOPIC
Bacteria, UA: NONE SEEN
Bilirubin Urine: NEGATIVE
Glucose, UA: NEGATIVE mg/dL
Ketones, ur: 5 mg/dL — AB
Leukocytes,Ua: NEGATIVE
Nitrite: NEGATIVE
Protein, ur: 30 mg/dL — AB
Specific Gravity, Urine: 1.015 (ref 1.005–1.030)
pH: 6 (ref 5.0–8.0)

## 2023-06-24 LAB — COMPREHENSIVE METABOLIC PANEL
ALT: 33 U/L (ref 0–44)
AST: 39 U/L (ref 15–41)
Albumin: 2.7 g/dL — ABNORMAL LOW (ref 3.5–5.0)
Alkaline Phosphatase: 49 U/L (ref 38–126)
Anion gap: 12 (ref 5–15)
BUN: 13 mg/dL (ref 8–23)
CO2: 22 mmol/L (ref 22–32)
Calcium: 8.4 mg/dL — ABNORMAL LOW (ref 8.9–10.3)
Chloride: 99 mmol/L (ref 98–111)
Creatinine, Ser: 0.79 mg/dL (ref 0.44–1.00)
GFR, Estimated: >60 mL/min (ref >60)
Glucose, Bld: 115 mg/dL — ABNORMAL HIGH (ref 70–99)
Potassium: 3.1 mmol/L — ABNORMAL LOW (ref 3.5–5.1)
Sodium: 133 mmol/L — ABNORMAL LOW (ref 135–145)
Total Bilirubin: 1.8 mg/dL — ABNORMAL HIGH (ref <1.2)
Total Protein: 6.2 g/dL — ABNORMAL LOW (ref 6.5–8.1)

## 2023-06-24 LAB — MAGNESIUM: Magnesium: 2.2 mg/dL (ref 1.7–2.4)

## 2023-06-24 LAB — PHOSPHORUS: Phosphorus: 2.5 mg/dL (ref 2.5–4.6)

## 2023-06-24 LAB — STREP PNEUMONIAE URINARY ANTIGEN: Strep Pneumo Urinary Antigen: NEGATIVE

## 2023-06-24 MED ORDER — ALBUTEROL SULFATE (2.5 MG/3ML) 0.083% IN NEBU: 2.5000 mg | INHALATION_SOLUTION | RESPIRATORY_TRACT | Status: DC | PRN

## 2023-06-24 MED ORDER — LEVOTHYROXINE SODIUM 25 MCG PO TABS
50.0000 ug | ORAL_TABLET | Freq: Every day | ORAL | Status: DC
  Administered 2023-06-24 – 2023-07-02 (×9): 50 ug via ORAL
  Filled 2023-06-24 (×3): qty 2
  Filled 2023-06-24: qty 1
  Filled 2023-06-24 (×2): qty 2
  Filled 2023-06-24: qty 1
  Filled 2023-06-24 (×2): qty 2

## 2023-06-24 MED ORDER — BOOST / RESOURCE BREEZE PO LIQD CUSTOM
1.0000 | Freq: Three times a day (TID) | ORAL | Status: DC
  Administered 2023-06-24 – 2023-07-02 (×12): 1 via ORAL

## 2023-06-24 MED ORDER — PRAVASTATIN SODIUM 10 MG PO TABS
20.0000 mg | ORAL_TABLET | Freq: Every day | ORAL | Status: DC
  Administered 2023-06-26 – 2023-07-01 (×6): 20 mg via ORAL
  Filled 2023-06-24 (×6): qty 2

## 2023-06-24 MED ORDER — PANTOPRAZOLE SODIUM 20 MG PO TBEC
20.0000 mg | DELAYED_RELEASE_TABLET | Freq: Every day | ORAL | Status: DC
  Filled 2023-06-24 (×3): qty 1

## 2023-06-24 MED ORDER — ADULT MULTIVITAMIN W/MINERALS CH
1.0000 | ORAL_TABLET | Freq: Every day | ORAL | Status: DC
  Administered 2023-06-25 – 2023-07-02 (×8): 1 via ORAL
  Filled 2023-06-24 (×9): qty 1

## 2023-06-24 MED ORDER — RIVAROXABAN 20 MG PO TABS: 20.0000 mg | ORAL_TABLET | Freq: Every day | ORAL | Status: DC

## 2023-06-24 MED ORDER — POTASSIUM CHLORIDE CRYS ER 20 MEQ PO TBCR
40.0000 meq | EXTENDED_RELEASE_TABLET | ORAL | Status: AC
  Administered 2023-06-24 (×2): 40 meq via ORAL
  Filled 2023-06-24 (×2): qty 2

## 2023-06-24 MED ORDER — SODIUM CHLORIDE 0.9 % IV SOLN
3.0000 g | Freq: Three times a day (TID) | INTRAVENOUS | Status: DC
  Administered 2023-06-24 (×2): 3 g via INTRAVENOUS
  Filled 2023-06-24 (×2): qty 8

## 2023-06-24 MED ORDER — PANTOPRAZOLE SODIUM 40 MG PO TBEC
40.0000 mg | DELAYED_RELEASE_TABLET | Freq: Every day | ORAL | Status: DC
  Administered 2023-06-24 – 2023-07-02 (×9): 40 mg via ORAL
  Filled 2023-06-24 (×10): qty 1

## 2023-06-24 MED ORDER — HYDROCHLOROTHIAZIDE 12.5 MG PO TABS
12.5000 mg | ORAL_TABLET | Freq: Every morning | ORAL | Status: DC
  Administered 2023-06-24: 12.5 mg via ORAL
  Filled 2023-06-24: qty 1

## 2023-06-24 MED ORDER — SODIUM CHLORIDE 0.9 % IV SOLN
3.0000 g | Freq: Four times a day (QID) | INTRAVENOUS | Status: DC
  Administered 2023-06-24 – 2023-06-27 (×11): 3 g via INTRAVENOUS
  Filled 2023-06-24 (×3): qty 8
  Filled 2023-06-24: qty 3
  Filled 2023-06-24 (×7): qty 8
  Filled 2023-06-24: qty 3
  Filled 2023-06-24 (×3): qty 8
  Filled 2023-06-24: qty 3
  Filled 2023-06-24 (×8): qty 8

## 2023-06-24 NOTE — Plan of Care (Signed)
  Problem: Education: Goal: Knowledge of General Education information will improve Description: Including pain rating scale, medication(s)/side effects and non-pharmacologic comfort measures Outcome: Progressing   Problem: Nutrition: Goal: Adequate nutrition will be maintained Outcome: Progressing   Problem: Coping: Goal: Level of anxiety will decrease Outcome: Progressing   

## 2023-06-24 NOTE — Progress Notes (Signed)
   06/24/23 1330  TOC Brief Assessment  Insurance and Status Reviewed  Patient has primary care physician Yes  Home environment has been reviewed Home Alone  Prior level of function: sister assist as needed  Prior/Current Home Services No current home services  Social Determinants of Health Reivew SDOH reviewed no interventions necessary  Readmission risk has been reviewed Yes  Transition of care needs no transition of care needs at this time   Transition of Care Department [facility]) has reviewed patient and no TOC needs have been identified at this time. We will continue to monitor patient advancement through interdisciplinary progression rounds. If new patient transition needs arise, please place a TOC consult.

## 2023-06-24 NOTE — Plan of Care (Signed)
  Problem: Education: Goal: Knowledge of General Education information will improve Description Including pain rating scale, medication(s)/side effects and non-pharmacologic comfort measures Outcome: Progressing   Problem: Nutrition: Goal: Adequate nutrition will be maintained Outcome: Not Progressing   Problem: Safety: Goal: Ability to remain free from injury will improve Outcome: Progressing

## 2023-06-24 NOTE — Progress Notes (Signed)
No acute events overnight. B Raedyn Wenke RN

## 2023-06-24 NOTE — Progress Notes (Addendum)
PROGRESS NOTE  Lisa Klein, is a 89 y.o. female, DOB - 1930/07/14, WRU:[id number]  Admit date - 06/23/2023   Admitting Physician Frankey Shown, DO  Outpatient Primary MD for the patient is Kerri Perches, MD  LOS - 1  Chief Complaint  Patient presents with   Fever     Brief Narrative:   89 y.o. female with medical history significant of hypertension, hyperlipidemia, hypothyroidism, GERD, right leg DVT and pulmonary embolus on Xarelto, stage III(T1 cN2 aM0) left breast IDC admitted on 06/23/23 with Sepsis due to CAP   -Assessment and Plan: 1)SIRS --POA-- ?? source Tmax 100.9, -Leukopenia tachycardia and tachypnea noted -UA Not suggestive of UTI -Continue Unasyn and azithromycin pending culture data -Continue mucolytic bronchodilators -Chest x-ray on admission without pneumonia repeat chest x-ray requested in a.m.  2)H/o Lt Leg  DVT and PE -----diagnosed 10/19/2022 hold Xarelto as platelets is down to 52k -No obvious bleeding concerns - 3) pancytopenia with worsening thrombocytopenia--in the setting of underlying malignancy and Xarelto use platelets down to 52K in the setting of Xarelto use -Hold Xarelto  4)Hypothyroidism--continue levothyroxine  5)GERD-stable continue Protonix  6)Hypomagnesemia/hypokalemia and hyponatremia--most likely related to HCTZ use -Replace electrolytes and stop hydrochlorothiazide  7)Stage III(T1 cN2 aM0) left breast IDC  She is status post left modified radical mastectomy on 02/14/2011 She was treated with anastrozole from 11/19/2011 through April 2023  Status is: Inpatient   Disposition: The patient is from: Home              Anticipated d/c is to: Home              Anticipated d/c date is: 1 day              Patient currently is not medically stable to d/c. Barriers: Not Clinically Stable-   Code Status :  -  Code Status: Full Code   Family Communication:    NA (patient is alert, awake and coherent)   DVT Prophylaxis  :   -  SCDs   SCDs Start: 06/23/23 2347  Lab Results  Component Value Date   PLT 52 (L) 06/24/2023   Inpatient Medications  Scheduled Meds:  dextromethorphan-guaiFENesin  1 tablet Oral BID   feeding supplement  1 Container Oral TID BM   levothyroxine  50 mcg Oral Daily   multivitamin with minerals  1 tablet Oral Daily   pantoprazole  40 mg Oral Daily   pravastatin  20 mg Oral q1800   Continuous Infusions:  ampicillin-sulbactam (UNASYN) IV 3 g (06/24/23 1759)   azithromycin     PRN Meds:.acetaminophen **OR** acetaminophen, albuterol, ondansetron **OR** ondansetron (ZOFRAN) IV   Anti-infectives (From admission, onward)    Start     Dose/Rate Route Frequency Ordered Stop   06/24/23 2200  azithromycin (ZITHROMAX) 500 mg in dextrose 5 % 250 mL IVPB        500 mg 250 mL/hr over 60 Minutes Intravenous Every 24 hours 06/23/23 2344 06/28/23 2159   06/24/23 1200  Ampicillin-Sulbactam (UNASYN) 3 g in sodium chloride 0.9 % 100 mL IVPB        3 g 200 mL/hr over 30 Minutes Intravenous Every 6 hours 06/24/23 0828     06/24/23 0330  Ampicillin-Sulbactam (UNASYN) 3 g in sodium chloride 0.9 % 100 mL IVPB  Status:  Discontinued        3 g 200 mL/hr over 30 Minutes Intravenous Every 6 hours 06/23/23 2344 06/24/23 0001   06/24/23 0100  Ampicillin-Sulbactam (UNASYN) 3  g in sodium chloride 0.9 % 100 mL IVPB  Status:  Discontinued        3 g 200 mL/hr over 30 Minutes Intravenous Every 8 hours 06/24/23 0001 06/24/23 0828   06/23/23 2130  ampicillin-sulbactam (UNASYN) 1.5 g in sodium chloride 0.9 % 100 mL IVPB        1.5 g 200 mL/hr over 30 Minutes Intravenous  Once 06/23/23 2122 06/24/23 0048   06/23/23 2130  azithromycin (ZITHROMAX) 500 mg in dextrose 5 % 250 mL IVPB        500 mg 250 mL/hr over 60 Minutes Intravenous STAT 06/23/23 2122 06/24/23 0137      Subjective: Lisa Klein today has no emesis,  No chest pain,   No further fever  Or chills   No Nausea, Vomiting or  Diarrhea  Objective: Vitals:   06/23/23 2030 06/23/23 2231 06/24/23 0037 06/24/23 0422  BP: 110/70 123/76 137/71 (!) 101/53  Pulse: 81 (!) 106 (!) 102 99  Resp: 19 (!) 26 20   Temp: (!) 100.9 F (38.3 C) 98.8 F (37.1 C) (!) 100.6 F (38.1 C) 99.2 F (37.3 C)  TempSrc: Rectal Oral Oral Oral  SpO2: 96% 100% 94% 94%  Weight:  78.9 kg    Height:  5\' 4"  (1.626 m)      Intake/Output Summary (Last 24 hours) at 06/24/2023 1821 Last data filed at 06/24/2023 1431 Gross per 24 hour  Intake 1199.49 ml  Output --  Net 1199.49 ml   Filed Weights   06/23/23 1720 06/23/23 2231  Weight: 80.8 kg 78.9 kg   Physical Exam Gen:- Awake Alert,  in no apparent distress  HEENT:- [place].AT, No sclera icterus Neck-Supple Neck,No JVD,.  Lungs-  No wheezing , fair symmetrical air movement CV- S1, S2 normal, regular  Abd-  +ve B.Sounds, Abd Soft, No tenderness,    Extremity/Skin:- No  edema, pedal pulses present  Psych-affect is appropriate, oriented x3 Neuro-Generalized weakness, No new focal deficits, no tremors  Data Reviewed: I have personally reviewed following labs and imaging studies  CBC: Recent Labs  Lab 06/23/23 1759 06/24/23 0408  WBC 1.8* 1.4*  NEUTROABS 0.6*  --   HGB 11.5* 10.5*  HCT 34.9* 30.9*  MCV 92.6 93.1  PLT 63* 52*   Basic Metabolic Panel: Recent Labs  Lab 06/23/23 1759 06/24/23 0408  NA 134* 133*  K 3.8 3.1*  CL 99 99  CO2 22 22  GLUCOSE 104* 115*  BUN 16 13  CREATININE 1.10* 0.79  CALCIUM 8.7* 8.4*  MG 1.6* 2.2  PHOS  --  2.5   GFR: Estimated Creatinine Clearance: 47.5 mL/min (by C-G formula based on SCr of 0.79 mg/dL). Liver Function Tests: Recent Labs  Lab 06/23/23 1759 06/24/23 0408  AST 51* 39  ALT 42 33  ALKPHOS 51 49  BILITOT 1.3* 1.8*  PROT 6.9 6.2*  ALBUMIN 3.1* 2.7*   Recent Results (from the past 240 hour(s))  Blood culture (routine x 2)     Status: None (Preliminary result)   Collection Time: 06/23/23  6:16 PM   Specimen: BLOOD  RIGHT HAND  Result Value Ref Range Status   Specimen Description BLOOD RIGHT HAND  Final   Special Requests   Final    BOTTLES DRAWN AEROBIC AND ANAEROBIC Blood Culture adequate volume   Culture   Final    NO GROWTH < 12 HOURS Performed at [facility], [address] [place], [place] [address]    Report Status PENDING  Incomplete  Blood culture (routine x 2)     Status: None (Preliminary result)   Collection Time: 06/23/23  6:25 PM   Specimen: BLOOD RIGHT FOREARM  Result Value Ref Range Status   Specimen Description BLOOD RIGHT FOREARM  Final   Special Requests   Final    BOTTLES DRAWN AEROBIC AND ANAEROBIC Blood Culture adequate volume   Culture   Final    NO GROWTH < 12 HOURS Performed at [facility], [address] [place], [place] [address]    Report Status PENDING  Incomplete  SARS Coronavirus 2 by RT PCR (hospital order, performed in [facility] hospital lab) *cepheid single result test* Anterior Nasal Swab     Status: None   Collection Time: 06/23/23  8:25 PM   Specimen: Anterior Nasal Swab  Result Value Ref Range Status   SARS Coronavirus 2 by RT PCR NEGATIVE NEGATIVE Final    Comment: (NOTE) SARS-CoV-2 target nucleic acids are NOT DETECTED.  The SARS-CoV-2 RNA is generally detectable in upper and lower respiratory specimens during the acute phase of infection. The lowest concentration of SARS-CoV-2 viral copies this assay can detect is 250 copies / mL. A negative result does not preclude SARS-CoV-2 infection and should not be used as the sole basis for treatment or other patient management decisions.  A negative result may occur with improper specimen collection / handling, submission of specimen other than nasopharyngeal swab, presence of viral mutation(s) within the areas targeted by this assay, and inadequate number of viral copies (<250 copies / mL). A negative result must be combined with clinical observations, patient history, and epidemiological  information.  Fact Sheet for Patients:   RoadLapTop.co.za  Fact Sheet for Healthcare Providers: http://kim-miller.com/  This test is not yet approved or  cleared by the [place] FDA and has been authorized for detection and/or diagnosis of SARS-CoV-2 by FDA under an Emergency Use Authorization (EUA).  This EUA will remain in effect (meaning this test can be used) for the duration of the COVID-19 declaration under Section 564(b)(1) of the Act, 21 U.S.C. section 360bbb-3(b)(1), unless the authorization is terminated or revoked sooner.  Performed at [facility], [address] [place], [place] [address]     Radiology Studies: [facility] Chest [facility] 1 View  Result Date: 06/23/2023 CLINICAL DATA:  Cough and fever. EXAM: PORTABLE CHEST 1 VIEW COMPARISON:  March 22, 2021 FINDINGS: Cardiomediastinal silhouette is normal. Mediastinal contours appear intact. There is no evidence of focal airspace consolidation, pleural effusion or pneumothorax. Osseous structures are without acute abnormality. Left chest wall postsurgical changes stable. IMPRESSION: No active disease. Electronically Signed   By: Ted Mcalpine M.D.   On: 06/23/2023 18:20    Scheduled Meds:  dextromethorphan-guaiFENesin  1 tablet Oral BID   feeding supplement  1 Container Oral TID BM   levothyroxine  50 mcg Oral Daily   multivitamin with minerals  1 tablet Oral Daily   pantoprazole  40 mg Oral Daily   pravastatin  20 mg Oral q1800   Continuous Infusions:  ampicillin-sulbactam (UNASYN) IV 3 g (06/24/23 1759)   azithromycin      LOS: 1 day   Shon Hale M.D on 06/24/2023 at 6:21 PM  Go to [email or web address] - for contact info  [facility] - Office  [phone]  If 7PM-7AM, please contact night-coverage [email or web address] 06/24/2023, 6:21 PM

## 2023-06-24 NOTE — Progress Notes (Signed)
Initial Nutrition Assessment  DOCUMENTATION CODES:   Not applicable  INTERVENTION:   Boost Breeze po TID, each supplement provides 250 kcal and 9 grams of protein. D/C Ensure, patient dislikes. MVI with minerals daily.  NUTRITION DIAGNOSIS:   Inadequate oral intake related to decreased appetite as evidenced by per patient/family report.  GOAL:   Patient will meet greater than or equal to 90% of their needs  MONITOR:   PO intake, Supplement acceptance  REASON FOR ASSESSMENT:   Malnutrition Screening Tool    ASSESSMENT:   89 yo female admitted with sepsis, CAP. PMH includes DJD, HTN, hypothyroidism, DDD, remote breast cancer, osteopenia.  Patient reports that she has a lot of phlegm in her throat and that is causing her to have a decreased appetite and intake. She ate approximately 50% of her breakfast this morning per RN, but didn't want lunch because the smell turned her stomach. She has some Ensure supplements at home, but does not like them. She agreed to try [employer] instead.    Labs reviewed. Na 133, K 3.1  Medications reviewed and include hydrochlorothiazide, protonix, klor-con, mag sulfate x 1.  Weight history reviewed. No significant weight changes noted over the past year.   NUTRITION - FOCUSED PHYSICAL EXAM:  Patient declined  Diet Order:   Diet Order             Diet Heart Room service appropriate? Yes; Fluid consistency: Thin  Diet effective now                   EDUCATION NEEDS:   Not appropriate for education at this time  Skin:  Skin Assessment: Reviewed RN Assessment  Last BM:  11/16  Height:   Ht Readings from Last 1 Encounters:  06/23/23 5\' 4"  (1.626 m)    Weight:   Wt Readings from Last 1 Encounters:  06/23/23 78.9 kg    Ideal Body Weight:  54.5 kg  BMI:  Body mass index is 29.87 kg/m.  Estimated Nutritional Needs:   Kcal:  1500-1700  Protein:  80-90 gm  Fluid:  1.5-1.7 L   Gabriel Rainwater RD, LDN,  CNSC Please refer to Amion for contact information.

## 2023-06-25 ENCOUNTER — Inpatient Hospital Stay (HOSPITAL_COMMUNITY): Payer: Medicare HMO

## 2023-06-25 DIAGNOSIS — K219 Gastro-esophageal reflux disease without esophagitis: Secondary | ICD-10-CM | POA: Diagnosis not present

## 2023-06-25 DIAGNOSIS — D709 Neutropenia, unspecified: Secondary | ICD-10-CM | POA: Diagnosis not present

## 2023-06-25 DIAGNOSIS — D61818 Other pancytopenia: Secondary | ICD-10-CM | POA: Diagnosis present

## 2023-06-25 DIAGNOSIS — D696 Thrombocytopenia, unspecified: Secondary | ICD-10-CM | POA: Diagnosis not present

## 2023-06-25 DIAGNOSIS — R5081 Fever presenting with conditions classified elsewhere: Secondary | ICD-10-CM | POA: Diagnosis present

## 2023-06-25 DIAGNOSIS — E039 Hypothyroidism, unspecified: Secondary | ICD-10-CM | POA: Diagnosis not present

## 2023-06-25 LAB — COMPREHENSIVE METABOLIC PANEL
ALT: 38 U/L (ref 0–44)
AST: 41 U/L (ref 15–41)
Albumin: 2.5 g/dL — ABNORMAL LOW (ref 3.5–5.0)
Alkaline Phosphatase: 58 U/L (ref 38–126)
Anion gap: 11 (ref 5–15)
BUN: 12 mg/dL (ref 8–23)
CO2: 23 mmol/L (ref 22–32)
Calcium: 8 mg/dL — ABNORMAL LOW (ref 8.9–10.3)
Chloride: 99 mmol/L (ref 98–111)
Creatinine, Ser: 0.86 mg/dL (ref 0.44–1.00)
GFR, Estimated: >60 mL/min (ref >60)
Glucose, Bld: 130 mg/dL — ABNORMAL HIGH (ref 70–99)
Potassium: 3.4 mmol/L — ABNORMAL LOW (ref 3.5–5.1)
Sodium: 133 mmol/L — ABNORMAL LOW (ref 135–145)
Total Bilirubin: 2.2 mg/dL — ABNORMAL HIGH (ref <1.2)
Total Protein: 6.3 g/dL — ABNORMAL LOW (ref 6.5–8.1)

## 2023-06-25 LAB — CBC WITH DIFFERENTIAL/PLATELET
Abs Immature Granulocytes: 0 10*3/uL (ref 0.00–0.07)
Basophils Absolute: 0 10*3/uL (ref 0.0–0.1)
Basophils Relative: 0 %
Eosinophils Absolute: 0 10*3/uL (ref 0.0–0.5)
Eosinophils Relative: 0 %
HCT: 29.5 % — ABNORMAL LOW (ref 36.0–46.0)
Hemoglobin: 10.1 g/dL — ABNORMAL LOW (ref 12.0–15.0)
Lymphocytes Relative: 43 %
Lymphs Abs: 0.7 10*3/uL (ref 0.7–4.0)
MCH: 31.7 pg (ref 26.0–34.0)
MCHC: 34.2 g/dL (ref 30.0–36.0)
MCV: 92.5 fL (ref 80.0–100.0)
Monocytes Absolute: 0.3 10*3/uL (ref 0.1–1.0)
Monocytes Relative: 19 %
Neutro Abs: 0.4 10*3/uL — CL (ref 1.7–7.7)
Neutrophils Relative %: 28 %
Other: 10 %
Platelets: 47 10*3/uL — ABNORMAL LOW (ref 150–400)
RBC: 3.19 MIL/uL — ABNORMAL LOW (ref 3.87–5.11)
RDW: 18.1 % — ABNORMAL HIGH (ref 11.5–15.5)
WBC: 1.6 10*3/uL — ABNORMAL LOW (ref 4.0–10.5)
nRBC: 0 % (ref 0.0–0.2)

## 2023-06-25 LAB — RENAL FUNCTION PANEL
Albumin: 2.5 g/dL — ABNORMAL LOW (ref 3.5–5.0)
Anion gap: 10 (ref 5–15)
BUN: 13 mg/dL (ref 8–23)
CO2: 23 mmol/L (ref 22–32)
Calcium: 7.8 mg/dL — ABNORMAL LOW (ref 8.9–10.3)
Chloride: 99 mmol/L (ref 98–111)
Creatinine, Ser: 0.88 mg/dL (ref 0.44–1.00)
GFR, Estimated: >60 mL/min (ref >60)
Glucose, Bld: 128 mg/dL — ABNORMAL HIGH (ref 70–99)
Phosphorus: 2.1 mg/dL — ABNORMAL LOW (ref 2.5–4.6)
Potassium: 3.4 mmol/L — ABNORMAL LOW (ref 3.5–5.1)
Sodium: 132 mmol/L — ABNORMAL LOW (ref 135–145)

## 2023-06-25 LAB — CBC
HCT: 30.9 % — ABNORMAL LOW (ref 36.0–46.0)
Hemoglobin: 10.5 g/dL — ABNORMAL LOW (ref 12.0–15.0)
MCH: 31.6 pg (ref 26.0–34.0)
MCHC: 34 g/dL (ref 30.0–36.0)
MCV: 93.1 fL (ref 80.0–100.0)
Platelets: 52 10*3/uL — ABNORMAL LOW (ref 150–400)
RBC: 3.32 MIL/uL — ABNORMAL LOW (ref 3.87–5.11)
RDW: 18.1 % — ABNORMAL HIGH (ref 11.5–15.5)
WBC: 1.4 10*3/uL — CL (ref 4.0–10.5)
nRBC: 0 % (ref 0.0–0.2)

## 2023-06-25 LAB — TSH: TSH: 7.82 u[IU]/mL — ABNORMAL HIGH (ref 0.350–4.500)

## 2023-06-25 LAB — PATHOLOGIST SMEAR REVIEW

## 2023-06-25 MED ORDER — POTASSIUM CHLORIDE CRYS ER 20 MEQ PO TBCR
40.0000 meq | EXTENDED_RELEASE_TABLET | ORAL | Status: AC
  Administered 2023-06-25: 40 meq via ORAL
  Filled 2023-06-25: qty 2

## 2023-06-25 MED ORDER — POTASSIUM PHOSPHATES 15 MMOLE/5ML IV SOLN
30.0000 mmol | Freq: Once | INTRAVENOUS | Status: AC
  Administered 2023-06-25: 30 mmol via INTRAVENOUS
  Filled 2023-06-25: qty 10

## 2023-06-25 MED ORDER — VANCOMYCIN HCL IN DEXTROSE 1-5 GM/200ML-% IV SOLN
1000.0000 mg | INTRAVENOUS | Status: DC
  Administered 2023-06-26 – 2023-06-30 (×5): 1000 mg via INTRAVENOUS
  Filled 2023-06-25 (×5): qty 200

## 2023-06-25 MED ORDER — SODIUM CHLORIDE 0.9 % IV SOLN
500.0000 mg | INTRAVENOUS | Status: AC
  Administered 2023-06-25 – 2023-06-28 (×4): 500 mg via INTRAVENOUS
  Filled 2023-06-25 (×3): qty 5

## 2023-06-25 MED ORDER — VANCOMYCIN HCL 1500 MG/300ML IV SOLN
1500.0000 mg | Freq: Once | INTRAVENOUS | Status: AC
  Administered 2023-06-25: 1500 mg via INTRAVENOUS
  Filled 2023-06-25: qty 300

## 2023-06-25 NOTE — [place] FL2 (Addendum)
[place] MEDICAID FL2 LEVEL OF CARE FORM     IDENTIFICATION  Patient Name: Lisa Klein Birthdate: 1929-09-20 Sex: female Admission Date (Current Location): 06/23/2023  [facility] and [place] Number:  [employer] and Address:  [facility],  [address] [place] [address]      Provider Number: [id number]  Attending Physician Name and Address:  Shon Hale, MD  Relative Name and Phone Number:  Anaely, Peraza [phone])  [phone]    Current Level of Care: Hospital Recommended Level of Care: Skilled Nursing Facility Prior Approval Number:    Date Approved/Denied:   PASRR Number: [id number]  Discharge Plan: SNF    Current Diagnoses: Patient Active Problem List   Diagnosis Date Noted   Thrombocytopenia (HCC) 06/24/2023   Sepsis (HCC) 06/24/2023   CAP (community acquired pneumonia) 06/24/2023   Hypomagnesemia 06/24/2023   Hypoalbuminemia due to protein-calorie malnutrition (HCC) 06/24/2023   DVT AND Pulm Embolism-H/o Lt Leg  DVT and PE -----diagnosed 10/19/2022 06/24/2023   Aspiration pneumonia (HCC) 06/23/2023   Dizziness 01/09/2023   Impacted cerumen of both ears 12/03/2022   Osteoarthritis of left knee 10/11/2022   Encounter for annual general medical examination with abnormal findings in adult 04/12/2022   Bilateral shoulder pain 12/19/2021   Fatigue 03/27/2021   Shortness of breath 03/27/2021   GERD (gastroesophageal reflux disease) 03/27/2021   Knee pain, left 02/15/2020   Generalized osteoarthritis of multiple sites 04/02/2018   Dermatomycosis 04/18/2017   Allergic rhinitis 09/01/2015   Obesity 12/13/2014   Long term current use of anticoagulant therapy 12/07/2014   Osteopenia 08/24/2014   Pulmonary embolism (HCC) 10/17/2012   Infiltrating ductal carcinoma of breast (HCC) 04/10/2011   Mixed hyperlipidemia 11/07/2007   Acquired hypothyroidism 08/15/2007   Essential hypertension 08/15/2007    Orientation RESPIRATION  BLADDER Height & Weight     Self, Time, Situation, Place  Normal Continent Weight: 174 lb (78.9 kg) Height:  5\' 4"  (162.6 cm)  BEHAVIORAL SYMPTOMS/MOOD NEUROLOGICAL BOWEL NUTRITION STATUS      Continent Diet (see discharge summary)  AMBULATORY STATUS COMMUNICATION OF NEEDS Skin   Extensive Assist Verbally Normal                       Personal Care Assistance Level of Assistance  Bathing, Dressing, Feeding Bathing Assistance: Maximum assistance Feeding assistance: Limited assistance Dressing Assistance: Maximum assistance     Functional Limitations Info  Sight, Hearing, Speech Sight Info: Adequate Hearing Info: Adequate Speech Info: Adequate    SPECIAL CARE FACTORS FREQUENCY  PT (By licensed PT)     PT Frequency: 5 times a week              Contractures Contractures Info: Not present    Additional Factors Info  Code Status, Allergies Code Status Info: FULL Allergies Info: Literine and singulair           Current Medications (06/25/2023):  This is the current hospital active medication list Current Facility-Administered Medications  Medication Dose Route Frequency Provider Last Rate Last Admin   acetaminophen (TYLENOL) tablet 650 mg  650 mg Oral Q6H PRN Adefeso, Oladapo, DO   650 mg at 06/24/23 1412   Or   acetaminophen (TYLENOL) suppository 650 mg  650 mg Rectal Q6H PRN Adefeso, Oladapo, DO       albuterol (PROVENTIL) (2.5 MG/3ML) 0.083% nebulizer solution 2.5 mg  2.5 mg Nebulization Q2H PRN Emokpae, Courage, MD       Ampicillin-Sulbactam (UNASYN) 3 g  in sodium chloride 0.9 % 100 mL IVPB  3 g Intravenous Q6H Emokpae, Courage, MD 200 mL/hr at 06/25/23 1146 3 g at 06/25/23 1146   azithromycin (ZITHROMAX) 500 mg in dextrose 5 % 250 mL IVPB  500 mg Intravenous Q24H Adefeso, Oladapo, DO   Stopped at 06/24/23 2341   dextromethorphan-guaiFENesin (MUCINEX DM) 30-600 MG per 12 hr tablet 1 tablet  1 tablet Oral BID Adefeso, Oladapo, DO   1 tablet at 06/25/23 1148    feeding supplement (BOOST / RESOURCE BREEZE) liquid 1 Container  1 Container Oral TID BM Shon Hale, MD   1 Container at 06/25/23 1327   levothyroxine (SYNTHROID) tablet 50 mcg  50 mcg Oral Daily Adefeso, Oladapo, DO   50 mcg at 06/25/23 0435   multivitamin with minerals tablet 1 tablet  1 tablet Oral Daily Emokpae, Courage, MD   1 tablet at 06/25/23 1148   ondansetron (ZOFRAN) tablet 4 mg  4 mg Oral Q6H PRN Adefeso, Oladapo, DO       Or   ondansetron (ZOFRAN) injection 4 mg  4 mg Intravenous Q6H PRN Adefeso, Oladapo, DO   4 mg at 06/25/23 1149   pantoprazole (PROTONIX) EC tablet 40 mg  40 mg Oral Daily Emokpae, Courage, MD   40 mg at 06/25/23 1148   potassium PHOSPHATE 30 mmol in dextrose 5 % 500 mL infusion  30 mmol Intravenous Once Shon Hale, MD 85 mL/hr at 06/25/23 1327 30 mmol at 06/25/23 1327   pravastatin (PRAVACHOL) tablet 20 mg  20 mg Oral q1800 Adefeso, Oladapo, DO       Facility-Administered Medications Ordered in Other Encounters  Medication Dose Route Frequency Provider Last Rate Last Admin   heparin lock flush 100 unit/mL  500 Units Intravenous Once Kefalas, Thomas S, PA-C       heparin lock flush 100 unit/mL  500 Units Intravenous Once Glenford Peers, MD       sodium chloride 0.9 % injection 10 mL  10 mL Intravenous PRN Kefalas, Thomas S, PA-C       sodium chloride 0.9 % injection 10 mL  10 mL Intravenous PRN Glenford Peers, MD         Discharge Medications: Please see discharge summary for a list of discharge medications.  Relevant Imaging Results:  Relevant Lab Results:   Additional Information SS#[id number]  Isabella Bowens, [place]

## 2023-06-25 NOTE — TOC Initial Note (Addendum)
Transition of Care [facility]) - Initial/Assessment Note    Patient Details  Name: Lisa Klein MRN: [id number] Date of Birth: [birth year]  [facility] CM/SW Contact:    Beather Arbour Phone Number: 06/25/2023, 3:44 PM  Clinical Narrative:   Patient was admitted for Sepsis. PT recommended SNF, discussed with son patient options for rehab. Patient agreeable and desired choice is [facility]. Per, Lynnea Ferrier at this time , there are no beds, TOC expanded bed search. Patient lives alone, has a cane and walker to use as needed, and have a family support. Family provides transportation as needed. TOC will follow for bed offers.    Expected Discharge Plan: Skilled Nursing Facility Barriers to Discharge: No Barriers Identified   Patient Goals and CMS Choice Patient states their goals for this hospitalization and ongoing recovery are:: Agreeable to SNF CMS Medicare.gov Compare Post Acute Care list provided to:: Patient Represenative (must comment) (Adult child) Choice offered to / list presented to : Adult Children [place] ownership interest in [facility].provided to:: Adult Children    Expected Discharge Plan and Services     Post Acute Care Choice: Nursing Home Living arrangements for the past 2 months: Single Family Home                  Prior Living Arrangements/Services Living arrangements for the past 2 months: Single Family Home Lives with:: Self Patient language and need for interpreter reviewed:: Yes Do you feel safe going back to the place where you live?: Yes      Need for Family Participation in Patient Care: Yes (Comment) Care giver support system in place?: Yes (comment) Current home services: DME Criminal Activity/Legal Involvement Pertinent to Current Situation/Hospitalization: No - Comment as needed  Activities of Daily Living   ADL Screening (condition at time of admission) Independently performs ADLs?: Yes (appropriate for  developmental age) Is the patient deaf or have difficulty hearing?: No Does the patient have difficulty seeing, even when wearing glasses/contacts?: No Does the patient have difficulty concentrating, remembering, or making decisions?: No  Permission Sought/Granted Permission sought to share information with : Case Manager          Permission granted to share info w Relationship: Son     Emotional Assessment     Affect (typically observed): Accepting Orientation: : Oriented to Self, Oriented to Place, Oriented to  Time, Oriented to Situation Alcohol / Substance Use: Never Used Psych Involvement: No (comment)  Admission diagnosis:  Aspiration pneumonia (HCC) [J69.0] Neutropenic fever (HCC) [D70.9, R50.81] Pneumonia due to infectious organism, unspecified laterality, unspecified part of lung [J18.9] Patient Active Problem List   Diagnosis Date Noted   Thrombocytopenia (HCC) 06/24/2023   Sepsis (HCC) 06/24/2023   CAP (community acquired pneumonia) 06/24/2023   Hypomagnesemia 06/24/2023   Hypoalbuminemia due to protein-calorie malnutrition (HCC) 06/24/2023   DVT AND Pulm Embolism-H/o Lt Leg  DVT and PE -----diagnosed 10/19/2022 06/24/2023   Aspiration pneumonia (HCC) 06/23/2023   Dizziness 01/09/2023   Impacted cerumen of both ears 12/03/2022   Osteoarthritis of left knee 10/11/2022   Encounter for annual general medical examination with abnormal findings in adult 04/12/2022   Bilateral shoulder pain 12/19/2021   Fatigue 03/27/2021   Shortness of breath 03/27/2021   GERD (gastroesophageal reflux disease) 03/27/2021   Knee pain, left 02/15/2020   Generalized osteoarthritis of multiple sites 04/02/2018   Dermatomycosis 04/18/2017   Allergic rhinitis 09/01/2015   Obesity 12/13/2014   Long term current use  of anticoagulant therapy 12/07/2014   Osteopenia 08/24/2014   Pulmonary embolism (HCC) 10/17/2012   Infiltrating ductal carcinoma of breast (HCC) 04/10/2011   Mixed  hyperlipidemia 11/07/2007   Acquired hypothyroidism 08/15/2007   Essential hypertension 08/15/2007   PCP:  Kerri Perches, MD Pharmacy:   [facility] - [place], [place] - [address] [place] [address] Phone: [phone] Fax: [phone]  [facility] - [place], [place] - [address] [place] [address] Phone: [phone] Fax: [phone]     Social Determinants of Health (SDOH) Social History: SDOH Screenings   Food Insecurity: No Food Insecurity (06/23/2023)  Housing: Patient Declined (06/23/2023)  Transportation Needs: No Transportation Needs (06/23/2023)  Utilities: Not At Risk (06/23/2023)  Alcohol Screen: Low Risk  (07/17/2022)  Depression (PHQ2-9): Medium Risk (04/23/2023)  Financial Resource Strain: Low Risk  (07/17/2022)  Physical Activity: Insufficiently Active (07/17/2022)  Social Connections: Socially Isolated (07/17/2022)  Stress: No Stress Concern Present (07/17/2022)  Tobacco Use: Low Risk  (06/23/2023)   SDOH Interventions:     Readmission Risk Interventions    06/25/2023    3:39 PM  Readmission Risk Prevention Plan  Transportation Screening Complete  PCP or Specialist Appt within 5-7 Days Not Complete  Home Care Screening Complete  Medication Review (RN CM) Complete

## 2023-06-25 NOTE — Consult Note (Signed)
Pharmacy Antibiotic Note  Lisa Klein is a 89 y.o. female admitted on 06/23/2023 with  neutropenic fevers .  They have stage III(T1 cN2 aM0) left breast cancer.  Pharmacy has been consulted for vancomycin dosing.  Today, 06/25/2023 WBC 1.6  ANC 0.4 Lactic 1.7 (11/17) 11/19 Chest - no acute pulmonary abnormality seen Tmax 101.3  HR > 100   Plan: Day 2 of antibiotics Give vancomycin 1500 mg IV x1. Start vancomycin 1000 mg IV Q24H. Goal AUC 400-550 Expected AUC: 485.2 Expected Css min: 13.3 SCr used: 0.86  Weight used: IBW, Vd used: 0.72 (BMI 30.46) Patient is also on Unasyn 3g IV Q6H & Azithromycin 500 mg IV Q24H Continue to monitor renal function and follow culture results  Height: 5\' 4"  (162.6 cm) Weight: 80.5 kg (177 lb 7.5 oz) IBW/kg (Calculated) : 54.7  Temp (24hrs), Avg:98.9 F (37.2 C), Min:98.7 F (37.1 C), Max:99 F (37.2 C)  Recent Labs  Lab 06/23/23 1759 06/23/23 1825 06/24/23 0408 06/25/23 0410  WBC 1.8*  --  1.4* 1.6*  CREATININE 1.10*  --  0.79 0.88  0.86  LATICACIDVEN  --  1.7  --   --     Estimated Creatinine Clearance: 44.6 mL/min (by C-G formula based on SCr of 0.86 mg/dL).    Allergies  Allergen Reactions   Listerine [Antiseptic Mouth Rinse] Other (See Comments)    Caused sore throat   Singulair [Montelukast Sodium] Other (See Comments)    Abdominal spasm     Antimicrobials this admission: 11/17 Unasyn 3g IV Q6H  >>  11/17 Azithromycin 500 mg IV Q24H  >>   Dose adjustments this admission: NA  Microbiology results: 11/17 BCx: NGTD MRSA PCR: None  Thank you for allowing pharmacy to be a part of this patient's care.  Effie Shy, PharmD Pharmacy Resident  06/25/2023 7:02 PM

## 2023-06-25 NOTE — Progress Notes (Signed)
Date and time results received: 06/25/23 0744 (use smartphrase ".now" to insert current time)  Test: blood Critical Value: absolute neurophil count   Name of Provider Notified: Dr. Marisa Severin   Orders Received? Or Actions Taken?:

## 2023-06-25 NOTE — Plan of Care (Signed)
  Problem: Acute Rehab PT Goals(only PT should resolve) Goal: Pt Will Go Supine/Side To Sit Outcome: Progressing Flowsheets (Taken 06/25/2023 1439) Pt will go Supine/Side to Sit:  with moderate assist  with minimal assist Goal: Patient Will Transfer Sit To/From Stand Outcome: Progressing Flowsheets (Taken 06/25/2023 1439) Patient will transfer sit to/from stand: with minimal assist Goal: Pt Will Transfer Bed To Chair/Chair To Bed Outcome: Progressing Flowsheets (Taken 06/25/2023 1439) Pt will Transfer Bed to Chair/Chair to Bed:  with min assist  with mod assist Goal: Pt Will Ambulate Outcome: Progressing Flowsheets (Taken 06/25/2023 1439) Pt will Ambulate:  25 feet  with minimal assist  with rolling walker  with moderate assist   2:39 PM, 06/25/23 Ocie Bob, MPT Physical Therapist with [facility] 336 [phone] office [phone] mobile phone

## 2023-06-25 NOTE — Progress Notes (Addendum)
PROGRESS NOTE  Lisa Klein, is a 89 y.o. female, DOB - Feb 10, 1930, NGE:[id number]  Admit date - 06/23/2023   Admitting Physician Frankey Shown, DO  Outpatient Primary MD for the patient is Kerri Perches, MD  LOS - 2  Chief Complaint  Patient presents with   Fever     Brief Narrative:   89 y.o. female with medical history significant of hypertension, hyperlipidemia, hypothyroidism, GERD, right leg DVT and pulmonary embolus on Xarelto, stage III(T1 cN2 aM0) left breast IDC admitted on 06/23/23 with Sepsis due to CAP   -Assessment and Plan: 1)SIRS/Neutropenic fevers--POA-- ?? source Tmax 100.9, -Leukopenia, tachycardia and tachypnea noted -UA Not suggestive of UTI -Blood cultures from 06/23/2023 NGTD -Continue Unasyn and azithromycin pending culture data -Chest x-ray on admission without pneumonia repeat chest x-ray on 06/25/23 without evidence of infection. 06/25/23- Tmax 101.3, T-current 99 -Add IV vancomycin -Repeat blood cultures -Discussed with oncologist Dr. Anders Simmonds--- official consult pending  2)H/o Lt Leg  DVT and PE -----diagnosed 10/19/2022 hold Xarelto as platelets is down to 47k -No obvious bleeding concerns - 3)Pancytopenia with worsening thrombocytopenia--in the setting of underlying malignancy and Xarelto use platelets down to 47K in the setting of Xarelto use -Hold Xarelto -Discussed with oncologist Dr. Anders Simmonds--- official consult pending -B12, folate, ferritin, iron TIBC serum copper and methylmalonic acid requested  4)Hypothyroidism--TSH was 8.6 on 04/02/2023  -Repeat TSH  -continue levothyroxine  5)GERD-stable continue Protonix  6)Hypomagnesemia/hypokalemia and hyponatremia--most likely related to HCTZ use -Replace electrolytes and stopped hydrochlorothiazide  7)Stage III(T1 cN2 aM0) left breast IDC  She is status post left modified radical mastectomy on 02/14/2011 She was treated with anastrozole from 11/19/2011 through April 2023  8)  generalized Weakness and deconditioning--PT eval appreciated recommends SNF rehab  9) social/ethics--plan of care and advanced directive discussed with patient and Patient's daughter [name] at bedside -Patient remains a full code  Status is: Inpatient   Disposition: The patient is from: Home              Anticipated d/c is to: Home              Anticipated d/c date is: 1 day              Patient currently is not medically stable to d/c. Barriers: Not Clinically Stable-   Code Status :  -  Code Status: Full Code   Family Communication:     (patient is alert, awake and coherent)   DVT Prophylaxis  :   - SCDs   SCDs Start: 06/23/23 2347  Lab Results  Component Value Date   PLT 47 (L) 06/25/2023   Inpatient Medications  Scheduled Meds:  dextromethorphan-guaiFENesin  1 tablet Oral BID   feeding supplement  1 Container Oral TID BM   levothyroxine  50 mcg Oral Daily   multivitamin with minerals  1 tablet Oral Daily   pantoprazole  40 mg Oral Daily   pravastatin  20 mg Oral q1800   Continuous Infusions:  ampicillin-sulbactam (UNASYN) IV 200 mL/hr at 06/25/23 1726   azithromycin Stopped (06/24/23 2341)   potassium PHOSPHATE IVPB (in mmol) 85 mL/hr at 06/25/23 1726   PRN Meds:.acetaminophen **OR** acetaminophen, albuterol, ondansetron **OR** ondansetron (ZOFRAN) IV   Anti-infectives (From admission, onward)    Start     Dose/Rate Route Frequency Ordered Stop   06/24/23 2200  azithromycin (ZITHROMAX) 500 mg in dextrose 5 % 250 mL IVPB        500 mg 250 mL/hr over 60 Minutes  Intravenous Every 24 hours 06/23/23 2344 06/28/23 2159   06/24/23 1200  Ampicillin-Sulbactam (UNASYN) 3 g in sodium chloride 0.9 % 100 mL IVPB        3 g 200 mL/hr over 30 Minutes Intravenous Every 6 hours 06/24/23 0828     06/24/23 0330  Ampicillin-Sulbactam (UNASYN) 3 g in sodium chloride 0.9 % 100 mL IVPB  Status:  Discontinued        3 g 200 mL/hr over 30 Minutes Intravenous Every 6 hours 06/23/23 2344  06/24/23 0001   06/24/23 0100  Ampicillin-Sulbactam (UNASYN) 3 g in sodium chloride 0.9 % 100 mL IVPB  Status:  Discontinued        3 g 200 mL/hr over 30 Minutes Intravenous Every 8 hours 06/24/23 0001 06/24/23 0828   06/23/23 2130  ampicillin-sulbactam (UNASYN) 1.5 g in sodium chloride 0.9 % 100 mL IVPB        1.5 g 200 mL/hr over 30 Minutes Intravenous  Once 06/23/23 2122 06/24/23 0048   06/23/23 2130  azithromycin (ZITHROMAX) 500 mg in dextrose 5 % 250 mL IVPB        500 mg 250 mL/hr over 60 Minutes Intravenous STAT 06/23/23 2122 06/24/23 1610      Subjective: Lisa Klein today has no emesis,  No chest pain,    No Nausea, Vomiting or Diarrhea Tmax 101.3, T-current 99--reports fatigue and malaise -Patient's daughter [name] at bedside  Objective: Vitals:   06/24/23 1842 06/24/23 1952 06/25/23 0412 06/25/23 0529  BP: (!) 102/54 (!) 102/50 (!) 88/58 106/69  Pulse: (!) 102 97 (!) 107   Resp: 19 (!) 22 20   Temp: 99.7 F (37.6 C) 99 F (37.2 C) 99 F (37.2 C)   TempSrc: Oral Oral Oral   SpO2: 93% 91% 91%   Weight:      Height:        Intake/Output Summary (Last 24 hours) at 06/25/2023 1820 Last data filed at 06/25/2023 1726 Gross per 24 hour  Intake 1402.44 ml  Output --  Net 1402.44 ml   Filed Weights   06/23/23 1720 06/23/23 2231  Weight: 80.8 kg 78.9 kg   Physical Exam Gen:- Awake Alert,  in no apparent distress  HEENT:- [place].AT, No sclera icterus Neck-Supple Neck,No JVD,.  Lungs-  No wheezing , fair symmetrical air movement CV- S1, S2 normal, regular  Abd-  +ve B.Sounds, Abd Soft, No tenderness,    Extremity/Skin:- No  edema, pedal pulses present  Psych-affect is appropriate, oriented x3 Neuro-Generalized weakness, No new focal deficits, no tremors  Data Reviewed: I have personally reviewed following labs and imaging studies  CBC: Recent Labs  Lab 06/23/23 1759 06/24/23 0408 06/25/23 0410  WBC 1.8* 1.4* 1.6*  NEUTROABS 0.6*  --  0.4*  HGB 11.5*  10.5* 10.1*  HCT 34.9* 30.9* 29.5*  MCV 92.6 93.1 92.5  PLT 63* 52* 47*   Basic Metabolic Panel: Recent Labs  Lab 06/23/23 1759 06/24/23 0408 06/25/23 0410  NA 134* 133* 132*  133*  K 3.8 3.1* 3.4*  3.4*  CL 99 99 99  99  CO2 22 22 23  23   GLUCOSE 104* 115* 128*  130*  BUN 16 13 13  12   CREATININE 1.10* 0.79 0.88  0.86  CALCIUM 8.7* 8.4* 7.8*  8.0*  MG 1.6* 2.2  --   PHOS  --  2.5 2.1*   GFR: Estimated Creatinine Clearance: 44.2 mL/min (by C-G formula based on SCr of 0.86 mg/dL). Liver Function Tests: Recent  Labs  Lab 06/23/23 1759 06/24/23 0408 06/25/23 0410  AST 51* 39 41  ALT 42 33 38  ALKPHOS 51 49 58  BILITOT 1.3* 1.8* 2.2*  PROT 6.9 6.2* 6.3*  ALBUMIN 3.1* 2.7* 2.5*  2.5*   Recent Results (from the past 240 hour(s))  Blood culture (routine x 2)     Status: None (Preliminary result)   Collection Time: 06/23/23  6:16 PM   Specimen: BLOOD RIGHT HAND  Result Value Ref Range Status   Specimen Description BLOOD RIGHT HAND  Final   Special Requests   Final    BOTTLES DRAWN AEROBIC AND ANAEROBIC Blood Culture adequate volume   Culture   Final    NO GROWTH 2 DAYS Performed at [facility], [address] [place], [place] [address]    Report Status PENDING  Incomplete  Blood culture (routine x 2)     Status: None (Preliminary result)   Collection Time: 06/23/23  6:25 PM   Specimen: BLOOD RIGHT FOREARM  Result Value Ref Range Status   Specimen Description BLOOD RIGHT FOREARM  Final   Special Requests   Final    BOTTLES DRAWN AEROBIC AND ANAEROBIC Blood Culture adequate volume   Culture   Final    NO GROWTH 2 DAYS Performed at [facility], [address], [place], [place] [address]    Report Status PENDING  Incomplete  SARS Coronavirus 2 by RT PCR (hospital order, performed in [facility] hospital lab) *cepheid single result test* Anterior Nasal Swab     Status: None   Collection Time: 06/23/23  8:25 PM   Specimen: Anterior Nasal Swab  Result Value  Ref Range Status   SARS Coronavirus 2 by RT PCR NEGATIVE NEGATIVE Final    Comment: (NOTE) SARS-CoV-2 target nucleic acids are NOT DETECTED.  The SARS-CoV-2 RNA is generally detectable in upper and lower respiratory specimens during the acute phase of infection. The lowest concentration of SARS-CoV-2 viral copies this assay can detect is 250 copies / mL. A negative result does not preclude SARS-CoV-2 infection and should not be used as the sole basis for treatment or other patient management decisions.  A negative result may occur with improper specimen collection / handling, submission of specimen other than nasopharyngeal swab, presence of viral mutation(s) within the areas targeted by this assay, and inadequate number of viral copies (<250 copies / mL). A negative result must be combined with clinical observations, patient history, and epidemiological information.  Fact Sheet for Patients:   RoadLapTop.co.za  Fact Sheet for Healthcare Providers: http://kim-miller.com/  This test is not yet approved or  cleared by the [place] FDA and has been authorized for detection and/or diagnosis of SARS-CoV-2 by FDA under an Emergency Use Authorization (EUA).  This EUA will remain in effect (meaning this test can be used) for the duration of the COVID-19 declaration under Section 564(b)(1) of the Act, 21 U.S.C. section 360bbb-3(b)(1), unless the authorization is terminated or revoked sooner.  Performed at [facility], [address], [place], [place] [address]     Radiology Studies: DG Chest 2 View  Result Date: 06/25/2023 CLINICAL DATA:  Dyspnea. EXAM: CHEST - 2 VIEW COMPARISON:  June 23, 2023. FINDINGS: The heart size and mediastinal contours are within normal limits. No acute pulmonary abnormality seen. The visualized skeletal structures are unremarkable. IMPRESSION: No active cardiopulmonary disease. Electronically Signed   By:  Lupita Raider M.D.   On: 06/25/2023 14:06    Scheduled Meds:  dextromethorphan-guaiFENesin  1 tablet Oral BID   feeding supplement  1 Container Oral TID BM   levothyroxine  50 mcg Oral Daily   multivitamin with minerals  1 tablet Oral Daily   pantoprazole  40 mg Oral Daily   pravastatin  20 mg Oral q1800   Continuous Infusions:  ampicillin-sulbactam (UNASYN) IV 200 mL/hr at 06/25/23 1726   azithromycin Stopped (06/24/23 2341)   potassium PHOSPHATE IVPB (in mmol) 85 mL/hr at 06/25/23 1726    LOS: 2 days   Shon Hale M.D on 06/25/2023 at 6:20 PM  Go to [email or web address] - for contact info  [facility] - Office  [phone]  If 7PM-7AM, please contact night-coverage [email or web address] 06/25/2023, 6:20 PM

## 2023-06-25 NOTE — Evaluation (Signed)
Physical Therapy Evaluation Patient Details Name: Lisa Klein MRN: [id number] DOB: 1930/01/08 Today's Date: 06/25/2023  History of Present Illness  Lisa Klein is a 89 y.o. female with medical history significant of hypertension, hyperlipidemia, hypothyroidism, GERD, right leg DVT and pulmonary embolus on Xarelto, stage III(T1 cN2 aM0) left breast IDC follows with Mauro Kaufmann, NP.  She presents to the ED from an urgent care.  Patient complained of 2-day onset of fever, chills, body aches which started after getting COVID shot a few days ago at Dr. Anthony Sar office.  She endorsed vomiting last night.  Patient went to an urgent care today and was told that she had a pneumonia on her chest x-ray, she was advised to go to the ED for further evaluation and management.  Patient lives alone and was able to take care of her ADLs.  She ambulates with a cane and walker.   Clinical Impression  Patient had difficulty sitting up at bedside due to weakness, upon sitting had episode of near vomiting due to nausea, very unsteady on feet and limited to a few steps before having to sit due to BLE weakness and fatigue.  Patient tolerated sitting up in chair after therapy - nursing staff notified concerning patient's nausea.  Patient will benefit from continued skilled physical therapy in hospital and recommended venue below to increase strength, balance, endurance for safe ADLs and gait.          If plan is discharge home, recommend the following: A lot of help with bathing/dressing/bathroom;A lot of help with walking and/or transfers;Help with stairs or ramp for entrance;Assistance with cooking/housework   Can travel by private vehicle   Yes    Equipment Recommendations None recommended by PT  Recommendations for Other Services       Functional Status Assessment Patient has had a recent decline in their functional status and demonstrates the ability to make significant improvements in  function in a reasonable and predictable amount of time.     Precautions / Restrictions Precautions Precautions: Fall Restrictions Weight Bearing Restrictions: No      Mobility  Bed Mobility Overal bed mobility: Needs Assistance Bed Mobility: Supine to Sit     Supine to sit: Mod assist     General bed mobility comments: increased time, labored movement    Transfers Overall transfer level: Needs assistance Equipment used: Rolling walker (2 wheels) Transfers: Sit to/from Stand, Bed to chair/wheelchair/BSC Sit to Stand: Mod assist   Step pivot transfers: Mod assist       General transfer comment: unsteady labored movement with buckling of knees due to weakness    Ambulation/Gait Ambulation/Gait assistance: Mod assist Gait Distance (Feet): 5 Feet Assistive device: Rolling walker (2 wheels) Gait Pattern/deviations: Decreased step length - right, Decreased step length - left, Decreased stride length, Knees buckling, Trunk flexed Gait velocity: slow     General Gait Details: limited to a few slow labored unsteady side steps before having to sit due to fall risk  Stairs            Wheelchair Mobility     Tilt Bed    Modified Rankin (Stroke Patients Only)       Balance Overall balance assessment: Needs assistance Sitting-balance support: Feet supported, No upper extremity supported Sitting balance-Leahy Scale: Fair Sitting balance - Comments: seated at EOB   Standing balance support: Reliant on assistive device for balance, During functional activity, Bilateral upper extremity supported Standing balance-Leahy Scale: Poor Standing balance comment: using  RW                             Pertinent Vitals/Pain Pain Assessment Pain Assessment: Faces Faces Pain Scale: Hurts little more Pain Location: generalized pain all over Pain Descriptors / Indicators: Aching, Sore Pain Intervention(s): Limited activity within patient's tolerance,  Monitored during session, Repositioned    Home Living Family/patient expects to be discharged to:: Private residence Living Arrangements: Alone Available Help at Discharge: Family;Available PRN/intermittently Type of Home: House Home Access: Ramped entrance       Home Layout: One level Home Equipment: Agricultural consultant (2 wheels);Rollator (4 wheels);Grab bars - tub/shower      Prior Function Prior Level of Function : Independent/Modified Independent             Mobility Comments: hosehold and short distanced community ambulator using SPC or RW ADLs Comments: Independent for household, assisted by family for community     Extremity/Trunk Assessment   Upper Extremity Assessment Upper Extremity Assessment: Generalized weakness    Lower Extremity Assessment Lower Extremity Assessment: Generalized weakness    Cervical / Trunk Assessment Cervical / Trunk Assessment: Normal  Communication   Communication Communication: No apparent difficulties Cueing Techniques: Verbal cues;Tactile cues  Cognition Arousal: Alert Behavior During Therapy: WFL for tasks assessed/performed Overall Cognitive Status: Within Functional Limits for tasks assessed                                          General Comments      Exercises     Assessment/Plan    PT Assessment Patient needs continued PT services  PT Problem List Decreased strength;Decreased activity tolerance;Decreased balance;Decreased mobility       PT Treatment Interventions DME instruction;Gait training;Stair training;Functional mobility training;Therapeutic activities;Therapeutic exercise;Balance training;Patient/family education    PT Goals (Current goals can be found in the Care Plan section)  Acute Rehab PT Goals Patient Stated Goal: return home after rehab PT Goal Formulation: With patient Time For Goal Achievement: 07/09/23 Potential to Achieve Goals: Good    Frequency Min 3X/week      Co-evaluation               AM-PAC PT "6 Clicks" Mobility  Outcome Measure Help needed turning from your back to your side while in a flat bed without using bedrails?: A Lot Help needed moving from lying on your back to sitting on the side of a flat bed without using bedrails?: A Lot Help needed moving to and from a bed to a chair (including a wheelchair)?: A Lot Help needed standing up from a chair using your arms (e.g., wheelchair or bedside chair)?: A Lot Help needed to walk in hospital room?: A Lot Help needed climbing 3-5 steps with a railing? : Total 6 Click Score: 11    End of Session   Activity Tolerance: Patient tolerated treatment well;Patient limited by fatigue Patient left: in chair;with call bell/phone within reach Nurse Communication: Mobility status PT Visit Diagnosis: Unsteadiness on feet (R26.81);Other abnormalities of gait and mobility (R26.89);Muscle weakness (generalized) (M62.81)    Time: 2841-3244 PT Time Calculation (min) (ACUTE ONLY): 23 min   Charges:   PT Evaluation $PT Eval Moderate Complexity: 1 Mod PT Treatments $Therapeutic Activity: 23-37 mins PT General Charges $$ ACUTE PT VISIT: 1 Visit         2:38 PM,  06/25/23 Ocie Bob, MPT Physical Therapist with [facility] 336 [phone] office [phone] mobile phone

## 2023-06-26 ENCOUNTER — Inpatient Hospital Stay (HOSPITAL_COMMUNITY): Payer: Medicare HMO

## 2023-06-26 DIAGNOSIS — D703 Neutropenia due to infection: Secondary | ICD-10-CM

## 2023-06-26 DIAGNOSIS — J9601 Acute respiratory failure with hypoxia: Secondary | ICD-10-CM | POA: Diagnosis not present

## 2023-06-26 DIAGNOSIS — I4891 Unspecified atrial fibrillation: Secondary | ICD-10-CM | POA: Diagnosis not present

## 2023-06-26 LAB — BLOOD GAS, ARTERIAL
Acid-base deficit: 0.6 mmol/L (ref 0.0–2.0)
Bicarbonate: 22.3 mmol/L (ref 20.0–28.0)
Drawn by: 22223
FIO2: 32 %
O2 Saturation: 91.4 %
Patient temperature: 37
pCO2 arterial: 30 mm[Hg] — ABNORMAL LOW (ref 32–48)
pH, Arterial: 7.48 — ABNORMAL HIGH (ref 7.35–7.45)
pO2, Arterial: 56 mm[Hg] — ABNORMAL LOW (ref 83–108)

## 2023-06-26 LAB — RENAL FUNCTION PANEL
Albumin: 2.4 g/dL — ABNORMAL LOW (ref 3.5–5.0)
Anion gap: 12 (ref 5–15)
BUN: 16 mg/dL (ref 8–23)
CO2: 21 mmol/L — ABNORMAL LOW (ref 22–32)
Calcium: 7.7 mg/dL — ABNORMAL LOW (ref 8.9–10.3)
Chloride: 99 mmol/L (ref 98–111)
Creatinine, Ser: 0.96 mg/dL (ref 0.44–1.00)
GFR, Estimated: 56 mL/min — ABNORMAL LOW (ref >60)
Glucose, Bld: 120 mg/dL — ABNORMAL HIGH (ref 70–99)
Phosphorus: 2.7 mg/dL (ref 2.5–4.6)
Potassium: 3.7 mmol/L (ref 3.5–5.1)
Sodium: 132 mmol/L — ABNORMAL LOW (ref 135–145)

## 2023-06-26 LAB — CBC WITH DIFFERENTIAL/PLATELET
Abs Immature Granulocytes: 0 10*3/uL (ref 0.00–0.07)
Basophils Absolute: 0 10*3/uL (ref 0.0–0.1)
Basophils Relative: 0 %
Eosinophils Absolute: 0 10*3/uL (ref 0.0–0.5)
Eosinophils Relative: 0 %
HCT: 33.4 % — ABNORMAL LOW (ref 36.0–46.0)
Hemoglobin: 11.3 g/dL — ABNORMAL LOW (ref 12.0–15.0)
Lymphocytes Relative: 50 %
Lymphs Abs: 0.8 10*3/uL (ref 0.7–4.0)
MCH: 31.3 pg (ref 26.0–34.0)
MCHC: 33.8 g/dL (ref 30.0–36.0)
MCV: 92.5 fL (ref 80.0–100.0)
Monocytes Absolute: 0.4 10*3/uL (ref 0.1–1.0)
Monocytes Relative: 23 %
Neutro Abs: 0.4 10*3/uL — CL (ref 1.7–7.7)
Neutrophils Relative %: 27 %
Platelets: 44 10*3/uL — ABNORMAL LOW (ref 150–400)
RBC: 3.61 MIL/uL — ABNORMAL LOW (ref 3.87–5.11)
RDW: 18.4 % — ABNORMAL HIGH (ref 11.5–15.5)
WBC: 1.6 10*3/uL — ABNORMAL LOW (ref 4.0–10.5)
nRBC: 0 % (ref 0.0–0.2)
nRBC: 1 /100{WBCs} — ABNORMAL HIGH

## 2023-06-26 LAB — VITAMIN B12: Vitamin B-12: 2034 pg/mL — ABNORMAL HIGH (ref 180–914)

## 2023-06-26 LAB — IRON AND TIBC
Iron: 73 ug/dL (ref 28–170)
Saturation Ratios: 46 % — ABNORMAL HIGH (ref 10.4–31.8)
TIBC: 161 ug/dL — ABNORMAL LOW (ref 250–450)
UIBC: 88 ug/dL

## 2023-06-26 LAB — FERRITIN: Ferritin: 1041 ng/mL — ABNORMAL HIGH (ref 11–307)

## 2023-06-26 LAB — MRSA NEXT GEN BY PCR, NASAL: MRSA by PCR Next Gen: NOT DETECTED

## 2023-06-26 LAB — FOLATE: Folate: 25.1 ng/mL (ref >5.9)

## 2023-06-26 LAB — D-DIMER, QUANTITATIVE: D-Dimer, Quant: 5 ug{FEU}/mL — ABNORMAL HIGH (ref 0.00–0.50)

## 2023-06-26 LAB — LEGIONELLA PNEUMOPHILA SEROGP 1 UR AG: L. pneumophila Serogp 1 Ur Ag: NEGATIVE

## 2023-06-26 LAB — TROPONIN I (HIGH SENSITIVITY): Troponin I (High Sensitivity): 78 ng/L — ABNORMAL HIGH (ref <18)

## 2023-06-26 MED ORDER — TECHNETIUM TO 99M ALBUMIN AGGREGATED
4.4000 | Freq: Once | INTRAVENOUS | Status: AC | PRN
  Administered 2023-06-26: 4.4 via INTRAVENOUS

## 2023-06-26 MED ORDER — CHLORHEXIDINE GLUCONATE CLOTH 2 % EX PADS
6.0000 | MEDICATED_PAD | Freq: Every day | CUTANEOUS | Status: DC
  Administered 2023-06-26 – 2023-07-02 (×6): 6 via TOPICAL

## 2023-06-26 NOTE — TOC Progression Note (Signed)
Transition of Care [facility]) - Progression Note    Patient Details  Name: Lisa Klein MRN: [id number] Date of Birth: July 27, 1930  [facility]) CM/SW Contact  Leitha Bleak, RN Phone Number: 06/26/2023, 4:18 PM  Clinical Narrative:   [name], daughter called to state she should be first person to contact. CM  spoke with son, and sister at the bedside to start [facility] and get bed offers. CM called to discuss bed offers, [name] is with a client now and can not talk. Patient and family wants her home. They may decide on home health.  CM left her a message with CMS home health choice for [facility].  TOC following.    Expected Discharge Plan: Skilled Nursing Facility Barriers to Discharge: No Barriers Identified  Expected Discharge Plan and Services     Post Acute Care Choice: Nursing Home Living arrangements for the past 2 months: Single Family Home           Social Determinants of Health (SDOH) Interventions SDOH Screenings   Food Insecurity: No Food Insecurity (06/23/2023)  Housing: Patient Declined (06/23/2023)  Transportation Needs: No Transportation Needs (06/23/2023)  Utilities: Not At Risk (06/23/2023)  Alcohol Screen: Low Risk  (07/17/2022)  Depression (PHQ2-9): Medium Risk (04/23/2023)  Financial Resource Strain: Low Risk  (07/17/2022)  Physical Activity: Insufficiently Active (07/17/2022)  Social Connections: Socially Isolated (07/17/2022)  Stress: No Stress Concern Present (07/17/2022)  Tobacco Use: Low Risk  (06/23/2023)   Readmission Risk Interventions    06/25/2023    3:39 PM  Readmission Risk Prevention Plan  Transportation Screening Complete  PCP or Specialist Appt within 5-7 Days Not Complete  Home Care Screening Complete  Medication Review (RN CM) Complete

## 2023-06-26 NOTE — Plan of Care (Signed)

## 2023-06-26 NOTE — Final Consult Note (Addendum)
[facility] Consultation Oncology  Name: Lisa Klein      MRN: [id number]    Location: IC12/IC12-01  Date: 06/26/2023 Time:3:33 PM   REFERRING PHYSICIAN:  Dr.Emokpae  REASON FOR CONSULT:  Leukopenia and thrombocytopenia   HISTORY OF PRESENT ILLNESS:    Lisa Klein is a 89 year old female with past medical history of Hypertension, hyperlipidemia, hypothyroidism, GERD, right leg DVT and pulmonary embolism on Xarelto, stage III left breast IDC s/p hormone therapy was admitted on 06/23/2023 with sepsis due to pneumonia.  Patient presented to the ER on 06/23/2023 with Dr. Fever and was later worked up which showed pneumonia on chest x-ray.  Her CBC at the time showed leukopenia with ANC of 600 and new thrombocytopenia with a platelet count of 63.  She is also mildly anemic with a hemoglobin of 11.5.  She is currently on antibiotics vancomycin and azithromycin.  She was initially started on Unasyn and azithromycin.  Patient of note also had COVID vaccination a few days ago.  Patient was in her bed, slightly somnolent when I saw her.  Reported some shortness of breath but no other complaints.  Denies bleeding, easy bruising and ecchymosis.   PAST MEDICAL HISTORY:   Past Medical History:  Diagnosis Date   Cancer (HCC) 2012   LEFT BREAST   Clotting disorder (HCC) 05/2011   dvt, right leg   Degenerative disc disease    with nerve compression    DJD (degenerative joint disease)    Hypertension    Hypertension    Hypothyroidism    Hypothyroidism    Long term current use of anticoagulant therapy 12/07/2014   Pulmonary embolus    Obesity    Osteopenia 08/24/2014    ALLERGIES: Allergies  Allergen Reactions   Listerine [Antiseptic Mouth Rinse] Other (See Comments)    Caused sore throat   Singulair [Montelukast Sodium] Other (See Comments)    Abdominal spasm       MEDICATIONS: I have reviewed the patient's current medications.     PAST SURGICAL HISTORY Past Surgical  History:  Procedure Laterality Date   ABDOMINAL HYSTERECTOMY  1980   fibroids   BREAST SURGERY  2012   left total mastectomy   CHOLECYSTECTOMY  80'S   [facility]   CHOLECYSTECTOMY     HIP PINNING,CANNULATED Left 01/19/2013   Procedure: CANNULATED HIP PINNING-  left;  Surgeon: Senaida Lange, MD;  Location: [facility] OR;  Service: Orthopedics;  Laterality: Left;   MASTECTOMY MODIFIED RADICAL  02/14/11   left   PORT-A-CATH REMOVAL N/A 12/05/2012   Procedure: MINOR REMOVAL PORT-A-CATH;  Surgeon: Dalia Heading, MD;  Location: [facility] ORS;  Service: General;  Laterality: N/A;  In Minor Room   PORTACATH PLACEMENT  04/16/2011   Procedure: INSERTION PORT-A-CATH;  Surgeon: Dalia Heading;  Location: [facility] ORS;  Service: General;  Laterality: Right;  right subclavian   VESICOVAGINAL FISTULA CLOSURE W/ TAH     [facility]    FAMILY HISTORY: Family History  Problem Relation Age of Onset   COPD Father    Lung disease Father    Hypertension Sister    Arthritis Mother    Hypertension Sister    Heart disease Son        stent   Anesthesia problems Neg Hx    Malignant hyperthermia Neg Hx    Hypotension Neg Hx    Pseudochol deficiency Neg Hx     SOCIAL HISTORY:  reports that she has never smoked. She has never used  smokeless tobacco. She reports that she does not drink alcohol and does not use drugs.  PERFORMANCE STATUS: The patient's performance status is 2 - Symptomatic, <50% confined to bed  PHYSICAL EXAM: Most Recent Vital Signs: Blood pressure 107/76, pulse (!) 107, temperature 98.1 F (36.7 C), temperature source Oral, resp. rate (!) 25, height 5\' 4"  (1.626 m), weight 179 lb 3.7 oz (81.3 kg), SpO2 98%.  GENERAL:alert, no distress and comfortable SKIN: skin color, texture, turgor are normal, no rashes or significant lesions LUNGS: clear to auscultation and percussion with normal breathing effort HEART: regular rate & rhythm and no murmurs and no lower extremity edema ABDOMEN:abdomen soft, non-tender and normal  bowel sounds Musculoskeletal:no cyanosis of digits and no clubbing  PSYCH: alert & oriented x 3 with fluent speech  LABORATORY DATA:  Results for orders placed or performed during the hospital encounter of 06/23/23 (from the past 48 hour(s))  Comprehensive metabolic panel     Status: Abnormal   Collection Time: 06/25/23  4:10 AM  Result Value Ref Range   Sodium 133 (L) 135 - 145 mmol/L   Potassium 3.4 (L) 3.5 - 5.1 mmol/L   Chloride 99 98 - 111 mmol/L   CO2 23 22 - 32 mmol/L   Glucose, Bld 130 (H) 70 - 99 mg/dL    Comment: Glucose reference range applies only to samples taken after fasting for at least 8 hours.   BUN 12 8 - 23 mg/dL   Creatinine, Ser 4.09 0.44 - 1.00 mg/dL   Calcium 8.0 (L) 8.9 - 10.3 mg/dL   Total Protein 6.3 (L) 6.5 - 8.1 g/dL   Albumin 2.5 (L) 3.5 - 5.0 g/dL   AST 41 15 - 41 U/L   ALT 38 0 - 44 U/L   Alkaline Phosphatase 58 38 - 126 U/L   Total Bilirubin 2.2 (H) <1.2 mg/dL   GFR, Estimated >81 >19 mL/min    Comment: (NOTE) Calculated using the CKD-EPI Creatinine Equation (2021)    Anion gap 11 5 - 15    Comment: Performed at [facility], [address], [place], [place] [address]  Renal function panel     Status: Abnormal   Collection Time: 06/25/23  4:10 AM  Result Value Ref Range   Sodium 132 (L) 135 - 145 mmol/L   Potassium 3.4 (L) 3.5 - 5.1 mmol/L   Chloride 99 98 - 111 mmol/L   CO2 23 22 - 32 mmol/L   Glucose, Bld 128 (H) 70 - 99 mg/dL    Comment: Glucose reference range applies only to samples taken after fasting for at least 8 hours.   BUN 13 8 - 23 mg/dL   Creatinine, Ser 9.56 0.44 - 1.00 mg/dL   Calcium 7.8 (L) 8.9 - 10.3 mg/dL   Phosphorus 2.1 (L) 2.5 - 4.6 mg/dL   Albumin 2.5 (L) 3.5 - 5.0 g/dL   GFR, Estimated >21 >30 mL/min    Comment: (NOTE) Calculated using the CKD-EPI Creatinine Equation (2021)    Anion gap 10 5 - 15    Comment: Performed at [facility], [address], [place], [place] [address]  CBC with Differential/Platelet      Status: Abnormal   Collection Time: 06/25/23  4:10 AM  Result Value Ref Range   WBC 1.6 (L) 4.0 - 10.5 K/uL    Comment: REPEATED TO VERIFY   RBC 3.19 (L) 3.87 - 5.11 MIL/uL   Hemoglobin 10.1 (L) 12.0 - 15.0 g/dL   HCT 46.9 (L) 62.9 -  46.0 %   MCV 92.5 80.0 - 100.0 fL   MCH 31.7 26.0 - 34.0 pg   MCHC 34.2 30.0 - 36.0 g/dL   RDW 16.1 (H) 09.6 - 04.5 %   Platelets 47 (L) 150 - 400 K/uL    Comment: SPECIMEN CHECKED FOR CLOTS Immature Platelet Fraction may be clinically indicated, consider ordering this additional test [id number] REPEATED TO VERIFY PLATELET COUNT CONFIRMED BY SMEAR    nRBC 0.0 0.0 - 0.2 %   Neutrophils Relative % 28 %   Neutro Abs 0.4 (LL) 1.7 - 7.7 K/uL    Comment: REPEATED TO VERIFY THIS CRITICAL RESULT HAS VERIFIED AND BEEN CALLED TO ANGEL RAY BY TONYA KENNEDY ON 11 19 2024 AT 0738, AND HAS BEEN READ BACK.     Lymphocytes Relative 43 %   Lymphs Abs 0.7 0.7 - 4.0 K/uL   Monocytes Relative 19 %   Monocytes Absolute 0.3 0.1 - 1.0 K/uL   Eosinophils Relative 0 %   Eosinophils Absolute 0.0 0.0 - 0.5 K/uL   Basophils Relative 0 %   Basophils Absolute 0.0 0.0 - 0.1 K/uL   WBC Morphology See Note     Comment: REACTIVE LYMPHOCYTES PRESENT   RBC Morphology See Note     Comment: ANISOCYTOSIS   Smear Review Reviewed    Other 10 %   Abs Immature Granulocytes 0.00 0.00 - 0.07 K/uL   Polychromasia PRESENT     Comment: Performed at [facility], [address], [place], [place] [address]  Pathologist smear review     Status: None   Collection Time: 06/25/23  4:10 AM  Result Value Ref Range   Path Review Reviewed By Havery Moros, M.D.     Comment: 11.19.2024 CIRCULATING BLASTIC CELLS IDENTIFIED.  Performed at [facility], [address] [place], [place] [address]   TSH     Status: Abnormal   Collection Time: 06/25/23  4:10 AM  Result Value Ref Range   TSH 7.820 (H) 0.350 - 4.500 uIU/mL    Comment: Performed by a 3rd Generation assay with a  functional sensitivity of <=0.01 uIU/mL. Performed at [facility], [address] [place], [place] [address]   Culture, blood (Routine X 2) w Reflex to ID Panel     Status: None (Preliminary result)   Collection Time: 06/25/23  7:53 PM   Specimen: BLOOD  Result Value Ref Range   Specimen Description BLOOD RIGHT ANTECUBITAL    Special Requests      BOTTLES DRAWN AEROBIC AND ANAEROBIC Blood Culture results may not be optimal due to an excessive volume of blood received in culture bottles   Culture      NO GROWTH < 12 HOURS Performed at [facility], [address], [place], [place] [address]    Report Status PENDING   Culture, blood (Routine X 2) w Reflex to ID Panel     Status: None (Preliminary result)   Collection Time: 06/25/23  7:53 PM   Specimen: BLOOD  Result Value Ref Range   Specimen Description BLOOD AEROBIC BOTTLE ONLY BLOOD RIGHT FOREARM    Special Requests      BOTTLES DRAWN AEROBIC ONLY Blood Culture results may not be optimal due to an inadequate volume of blood received in culture bottles   Culture      NO GROWTH < 12 HOURS Performed at [facility], [address], [place], [place] [address]    Report Status PENDING   Blood gas, arterial  Status: Abnormal   Collection Time: 06/26/23 12:24 AM  Result Value Ref Range   FIO2 32.0 %   pH, Arterial 7.48 (H) 7.35 - 7.45   pCO2 arterial 30 (L) 32 - 48 mmHg   pO2, Arterial 56 (L) 83 - 108 mmHg   Bicarbonate 22.3 20.0 - 28.0 mmol/L   Acid-base deficit 0.6 0.0 - 2.0 mmol/L   O2 Saturation 91.4 %   Patient temperature 37.0    Collection site LEFT RADIAL    Drawn by [id number]    Allens test (pass/fail) PASS PASS    Comment: Performed at [facility], [address], [place], [place] [address]  D-dimer, quantitative     Status: Abnormal   Collection Time: 06/26/23 12:28 AM  Result Value Ref Range   D-Dimer, Quant 5.00 (H) 0.00 - 0.50 ug/mL-FEU    Comment: (NOTE) At the manufacturer cut-off value of 0.5 g/mL FEU, this  assay has a negative predictive value of 95-100%.This assay is intended for use in conjunction with a clinical pretest probability (PTP) assessment model to exclude pulmonary embolism (PE) and deep venous thrombosis (DVT) in outpatients suspected of PE or DVT. Results should be correlated with clinical presentation. Performed at [facility], [address], [place], [place] [address]   MRSA Next Gen by PCR, Nasal     Status: None   Collection Time: 06/26/23  1:38 AM   Specimen: Nasal Mucosa; Nasal Swab  Result Value Ref Range   MRSA by PCR Next Gen NOT DETECTED NOT DETECTED    Comment: (NOTE) The GeneXpert MRSA Assay (FDA approved for NASAL specimens only), is one component of a comprehensive MRSA colonization surveillance program. It is not intended to diagnose MRSA infection nor to guide or monitor treatment for MRSA infections. Test performance is not FDA approved in patients less than 48 years old. Performed at [facility], [address] [place], [place] [address]   CBC with Differential/Platelet     Status: Abnormal   Collection Time: 06/26/23  4:53 AM  Result Value Ref Range   WBC 1.6 (L) 4.0 - 10.5 K/uL    Comment: REPEATED TO VERIFY   RBC 3.61 (L) 3.87 - 5.11 MIL/uL   Hemoglobin 11.3 (L) 12.0 - 15.0 g/dL   HCT 52.8 (L) 41.3 - 24.4 %   MCV 92.5 80.0 - 100.0 fL   MCH 31.3 26.0 - 34.0 pg   MCHC 33.8 30.0 - 36.0 g/dL   RDW 01.0 (H) 27.2 - 53.6 %   Platelets 44 (L) 150 - 400 K/uL    Comment: SPECIMEN CHECKED FOR CLOTS Immature Platelet Fraction may be clinically indicated, consider ordering this additional test [id number] REPEATED TO VERIFY PLATELET COUNT CONFIRMED BY SMEAR    nRBC 0.0 0.0 - 0.2 %   Neutrophils Relative % 27 %   Neutro Abs 0.4 (LL) 1.7 - 7.7 K/uL    Comment: This critical result has verified and been called to DUSTIN EVERETTE by Donetta Potts on 11 20 2024 at 0623, and has been read back.    Lymphocytes Relative 50 %   Lymphs Abs 0.8 0.7 - 4.0 K/uL    Monocytes Relative 23 %   Monocytes Absolute 0.4 0.1 - 1.0 K/uL   Eosinophils Relative 0 %   Eosinophils Absolute 0.0 0.0 - 0.5 K/uL   Basophils Relative 0 %   Basophils Absolute 0.0 0.0 - 0.1 K/uL   RBC Morphology See Note     Comment: POLYCHROMATIC RED CELLS PRESENT  Smear Review PLATELET COUNT CONFIRMED BY SMEAR    nRBC 1 (H) 0 /100 WBC   Abs Immature Granulocytes 0.00 0.00 - 0.07 K/uL   Polychromasia PRESENT     Comment: Performed at [facility], [address], [place], [place] [address]  Renal function panel     Status: Abnormal   Collection Time: 06/26/23  4:53 AM  Result Value Ref Range   Sodium 132 (L) 135 - 145 mmol/L   Potassium 3.7 3.5 - 5.1 mmol/L   Chloride 99 98 - 111 mmol/L   CO2 21 (L) 22 - 32 mmol/L   Glucose, Bld 120 (H) 70 - 99 mg/dL    Comment: Glucose reference range applies only to samples taken after fasting for at least 8 hours.   BUN 16 8 - 23 mg/dL   Creatinine, Ser 2.95 0.44 - 1.00 mg/dL   Calcium 7.7 (L) 8.9 - 10.3 mg/dL   Phosphorus 2.7 2.5 - 4.6 mg/dL   Albumin 2.4 (L) 3.5 - 5.0 g/dL   GFR, Estimated 56 (L) >60 mL/min    Comment: (NOTE) Calculated using the CKD-EPI Creatinine Equation (2021)    Anion gap 12 5 - 15    Comment: Performed at [facility], [address], [place], [place] [address]  Folate     Status: None   Collection Time: 06/26/23  4:53 AM  Result Value Ref Range   Folate 25.1 >5.9 ng/mL    Comment: RESULT CONFIRMED BY MANUAL DILUTION Performed at [facility], [address], [place], [place] [address]   Vitamin B12     Status: Abnormal   Collection Time: 06/26/23  4:53 AM  Result Value Ref Range   Vitamin B-12 2,034 (H) 180 - 914 pg/mL    Comment: RESULT CONFIRMED BY MANUAL DILUTION (NOTE) This assay is not validated for testing neonatal or myeloproliferative syndrome specimens for Vitamin B12 levels. Performed at [facility], [address], [place], [place] [address]   Ferritin     Status: Abnormal   Collection Time:  06/26/23  4:53 AM  Result Value Ref Range   Ferritin 1,041 (H) 11 - 307 ng/mL    Comment: Performed at [facility], [address], [place], [place] [address]  Iron and TIBC     Status: Abnormal   Collection Time: 06/26/23  4:53 AM  Result Value Ref Range   Iron 73 28 - 170 ug/dL   TIBC 034 (L) 742 - 595 ug/dL   Saturation Ratios 46 (H) 10.4 - 31.8 %   UIBC 88 ug/dL    Comment: Performed at [facility], [address], [place], [place] [address]      RADIOGRAPHY: DG Chest 2 View  Result Date: 06/25/2023 CLINICAL DATA:  Dyspnea. EXAM: CHEST - 2 VIEW COMPARISON:  June 23, 2023. FINDINGS: The heart size and mediastinal contours are within normal limits. No acute pulmonary abnormality seen. The visualized skeletal structures are unremarkable. IMPRESSION: No active cardiopulmonary disease. Electronically Signed   By: Lupita Raider M.D.   On: 06/25/2023 14:06       ASSESSMENT:   Patient is a 89 year old female with past medical history of multiple comorbidities and stage III left breast cancer s/p radical mastectomy and chemotherapy followed by hormone therapy admitted to the hospital for sepsis secondary to community-acquired pneumonia.  Patient had new onset leukopenia and thrombocytopenia for which hematology was consulted.  On review of the labs patient has new onset leukopenia with neutropenia and thrombocytopenia baseline white count  range from 3.5-4.5 and platelets almost always greater than 145.  Nutritional labs showed no iron deficiency, no vitamin B12 deficiency with normal folate level.  Patient is also slightly anemic with a hemoglobin of 11.3 and previously always had hemoglobin greater than 12.5.  Blood cultures at this time are pending, SARS COVID test was negative.    Patient also recently had COVID vaccination.  PLAN:   1.  Leukopenia/neutropenia: Likely secondary to acute infection.  No nutritional deficiencies identified.  Peripheral smear did not show any abnormal  cells.  Plan: -Continue to monitor for now -Continue broad-spectrum antibiotics until ANC is greater than 1000 and patient is afebrile for 48 hours.  Please make sure the patient has anti- pseudomonal coverage -Please send an LDH and uric acid with morning labs -As patient is afebrile currently, there is no recommendation for G-CSF  2.  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  Peripheral smear consistent with a platelet count.  No platelet clumping.  Plan: -Continue to monitor for now -Please send hepatitis panel, HIV testing -Can consider prophylactic dose anticoagulation until platelets are less than 30 and resume full dose anticoagulation when platelets are greater than 50 -Will arrange for follow-up outpatient in 1 month with labs to reassess  3.  DVT: Patient has a history of DVT and was on Eliquis  Plan: -Can hold Eliquis or to prophylactic dose Eliquis when platelets are less than 30 and resume full dose when platelets are more than 50  4.  History of breast cancer: Patient has a history of breast cancer s/p radical mastectomy and adjuvant chemotherapy.  Patient completed 10 years of hormone therapy and currently on surveillance.  Relapse of disease is low on differential at this time.  All questions were answered.  Thank you for involving [place] in this patient's care and please reach out to me with any questions.  Cindie Crumbly Hematology oncology [facility] at [facility]

## 2023-06-26 NOTE — Progress Notes (Signed)
PT Cancellation Note  Patient Details Name: Lisa Klein MRN: [id number] DOB: Nov 07, 1929   Cancelled Treatment:    Reason Eval/Treat Not Completed: Medical issues which prohibited therapy.  Patient transferred to a higher level of care and will need new PT consult to resume therapy when patient is medically stable.  Thank you.    7:43 AM, 06/26/23 Ocie Bob, MPT Physical Therapist with [facility] [phone] office [phone] mobile phone

## 2023-06-26 NOTE — Progress Notes (Signed)
Charge RN stopped by room to stop the IV pump from beeping. She noticed patient seem distress and was having some trouble breathing. Pt was satting in the 80s and her pulse was bouncing between 50s-120s. This RN stopped in room because I saw charge RN in there. We proceeded to get EKG which resulted in afib RVR, pt was started on NCO2 at 2L she slowing went up to low 90s but would drop back into the 80s. Bumped her up to 3.5L where her o2 maintain above 92. MD was made aware of situation as well as AC. AC to bedside to access pt. Pt stated she felt like her heart was racing but did not feel any pain. After about an hour patient stated she felt better. ICU RN to bedside to access patient as well. MD placed order for transfer. Pt was transferred after report was given in stable condition to ICU.

## 2023-06-26 NOTE — Evaluation (Signed)
Physical Therapy Evaluation Patient Details Name: Lisa Klein MRN: [id number] DOB: Dec 31, 1929 Today's Date: 06/26/2023  History of Present Illness  Lisa Klein is a 89 y.o. female with medical history significant of hypertension, hyperlipidemia, hypothyroidism, GERD, right leg DVT and pulmonary embolus on Xarelto, stage III(T1 cN2 aM0) left breast IDC follows with Mauro Kaufmann, NP.  She presents to the ED from an urgent care.  Patient complained of 2-day onset of fever, chills, body aches which started after getting COVID shot a few days ago at Dr. Anthony Sar office.  She endorsed vomiting last night.  Patient went to an urgent care today and was told that she had a pneumonia on her chest x-ray, she was advised to go to the ED for further evaluation and management.  Patient lives alone and was able to take care of her ADLs.  She ambulates with a cane and walker.   Clinical Impression  Patient demonstrates slow labored movement for getting into/out of bed, has most difficulty completing sit to stands/stand to sitting due to  limited knee flexion with leaning/falling backwards and limited to a few side steps before having to sit due to c/o fatigue and generalized weakness.  Patient tolerated staying up in chair after therapy.  Patient will benefit from continued skilled physical therapy in hospital and recommended venue below to increase strength, balance, endurance for safe ADLs and gait.      If plan is discharge home, recommend the following: A lot of help with bathing/dressing/bathroom;A lot of help with walking and/or transfers;Help with stairs or ramp for entrance;Assistance with cooking/housework   Can travel by private vehicle   Yes    Equipment Recommendations None recommended by PT  Recommendations for Other Services       Functional Status Assessment Patient has had a recent decline in their functional status and demonstrates the ability to make significant  improvements in function in a reasonable and predictable amount of time.     Precautions / Restrictions Precautions Precautions: Fall Restrictions Weight Bearing Restrictions: No      Mobility  Bed Mobility Overal bed mobility: Needs Assistance Bed Mobility: Supine to Sit, Sit to Supine       Sit to supine: Min assist   General bed mobility comments: slow labored movement    Transfers Overall transfer level: Needs assistance Equipment used: Rolling walker (2 wheels) Transfers: Sit to/from Stand, Bed to chair/wheelchair/BSC Sit to Stand: Mod assist   Step pivot transfers: Mod assist       General transfer comment: unsteady on feet with diffiuclty flexing knees during stand to sitting    Ambulation/Gait Ambulation/Gait assistance: Mod assist Gait Distance (Feet): 4 Feet Assistive device: Rolling walker (2 wheels) Gait Pattern/deviations: Decreased step length - right, Decreased step length - left, Decreased stride length, Knees buckling, Trunk flexed Gait velocity: slow     General Gait Details: limited to a few slow labored unsteady side steps due to c/o fatigue and generalized weakness  Stairs            Wheelchair Mobility     Tilt Bed    Modified Rankin (Stroke Patients Only)       Balance Overall balance assessment: Needs assistance Sitting-balance support: Feet supported, No upper extremity supported Sitting balance-Leahy Scale: Fair Sitting balance - Comments: seated at EOB   Standing balance support: Reliant on assistive device for balance, During functional activity, Bilateral upper extremity supported Standing balance-Leahy Scale: Poor Standing balance comment: using RW  Pertinent Vitals/Pain Pain Assessment Pain Assessment: No/denies pain    Home Living Family/patient expects to be discharged to:: Private residence Living Arrangements: Alone Available Help at Discharge: Family;Available  PRN/intermittently Type of Home: House Home Access: Ramped entrance       Home Layout: One level Home Equipment: Agricultural consultant (2 wheels);Rollator (4 wheels);Grab bars - tub/shower      Prior Function Prior Level of Function : Independent/Modified Independent             Mobility Comments: hosehold and short distanced community ambulator using SPC or RW ADLs Comments: Independent for household, assisted by family for community     Extremity/Trunk Assessment   Upper Extremity Assessment Upper Extremity Assessment: Generalized weakness    Lower Extremity Assessment Lower Extremity Assessment: Generalized weakness    Cervical / Trunk Assessment Cervical / Trunk Assessment: Normal  Communication   Communication Communication: No apparent difficulties Cueing Techniques: Verbal cues;Tactile cues  Cognition Arousal: Alert Behavior During Therapy: WFL for tasks assessed/performed Overall Cognitive Status: Within Functional Limits for tasks assessed                                          General Comments      Exercises     Assessment/Plan    PT Assessment Patient needs continued PT services  PT Problem List Decreased strength;Decreased activity tolerance;Decreased balance;Decreased mobility       PT Treatment Interventions DME instruction;Gait training;Stair training;Functional mobility training;Therapeutic activities;Therapeutic exercise;Balance training;Patient/family education    PT Goals (Current goals can be found in the Care Plan section)  Acute Rehab PT Goals Patient Stated Goal: return home after rehab PT Goal Formulation: With patient Time For Goal Achievement: 07/09/23 Potential to Achieve Goals: Good    Frequency Min 3X/week     Co-evaluation               AM-PAC PT "6 Clicks" Mobility  Outcome Measure Help needed turning from your back to your side while in a flat bed without using bedrails?: A Lot Help needed  moving from lying on your back to sitting on the side of a flat bed without using bedrails?: A Lot Help needed moving to and from a bed to a chair (including a wheelchair)?: A Lot Help needed standing up from a chair using your arms (e.g., wheelchair or bedside chair)?: A Lot Help needed to walk in hospital room?: A Lot Help needed climbing 3-5 steps with a railing? : Total 6 Click Score: 11    End of Session Equipment Utilized During Treatment: Oxygen Activity Tolerance: Patient tolerated treatment well;Patient limited by fatigue Patient left: in chair;with call bell/phone within reach Nurse Communication: Mobility status PT Visit Diagnosis: Unsteadiness on feet (R26.81);Other abnormalities of gait and mobility (R26.89);Muscle weakness (generalized) (M62.81)    Time: 4098-1191 PT Time Calculation (min) (ACUTE ONLY): 20 min   Charges:   PT Evaluation $PT Eval Moderate Complexity: 1 Mod PT Treatments $Therapeutic Activity: 8-22 mins PT General Charges $$ ACUTE PT VISIT: 1 Visit         11:54 AM, 06/26/23 Ocie Bob, MPT Physical Therapist with [facility] [phone] office [phone] mobile phone

## 2023-06-26 NOTE — Progress Notes (Signed)
[facility] PROGRESS NOTE  Lisa Klein (DOB: [birth year]) [id number] PCP: Kerri Perches, MD  Brief Narrative: 89 y.o. female with medical history significant of hypertension, hyperlipidemia, hypothyroidism, GERD, right leg DVT and pulmonary embolus on Xarelto, stage III(T1 cN2 aM0) left breast IDC admitted on 06/23/23 with Sepsis due to CAP   Subjective: Feeling fine this morning, eating some, no fevers overnight. did have dyspnea which has improved.   Objective: BP 107/76   Pulse (!) 107   Temp 98.1 F (36.7 C) (Oral)   Resp (!) 25   Ht 5\' 4"  (1.626 m)   Wt 81.3 kg   SpO2 98%   BMI 30.77 kg/m   Gen: Elderly female in no acute distress Pulm: Clear, nonlabored  CV: Irreg irreg, no MRG, +edema L > RLE GI: Soft, NT, ND, +BS  Neuro: Alert and oriented. No new focal deficits. Ext: Warm, no deformities. Skin: No open wounds on visualized skin   Assessment & Plan: Principal Problem:   Sepsis (HCC) Active Problems:   Pulmonary embolism (HCC)   Neutropenia with fever (HCC)   Pancytopenia (HCC)   DVT AND Pulm Embolism-H/o Lt Leg  DVT and PE -----diagnosed 10/19/2022   Acquired hypothyroidism   Mixed hyperlipidemia   Essential hypertension   Infiltrating ductal carcinoma of breast (HCC)   GERD (gastroesophageal reflux disease)   Thrombocytopenia (HCC)   CAP (community acquired pneumonia)   Hypomagnesemia   Hypoalbuminemia due to protein-calorie malnutrition (HCC)   Neutropenia associated with infection (HCC)  SIRS/Neutropenic fevers: Unclear source, though fevers not continuing.  - Oncology consulted, suspect cytopenias are related to an infection. Since no further fevers, Dr. Anders Simmonds recommends against GCSF. - Continue vancomycin, unasyn, azithromycin. MRSA PCR neg, consider deescalation if cultures remain negative and pt stabilizes.  - Monitor blood cultures (NGTD 11/17 and NGTD 11/20)  History of LLE  DVT and PE 10/19/2022:  - Holding anticoagulation  (xarelto PTA) due to persistent thrombocytopenia. D-dimer elevated to 5. - V/Q scan today since she developed AFib with RVR   Pancytopenia: Onc consulted as above.  - Check B12, folate, ferritin, iron TIBC serum copper and methylmalonic acid.   Hypothyroidism: TSH 7.820.  - Continue synthroid. with acute illness, unclear significance of mildly elevated TSH. Certainly would not increase supplementation in wake of new AFib.    GERD:  - Continue PPI   Hypomagnesemia/hypokalemia and hyponatremia: - Replace electrolytes.  - Holding hydrochlorothiazide   Stage III(T1 cN2 aM0) left breast IDC: s/p modified radical mastectomy on 02/14/2011. s/p anastrozole April 2013 - April 2023  Weakness and deconditioning--PT eval appreciated recommends SNF rehab  Acute respiratory failure with hypoxia:  - Negative V/Q scan 11/20, negative CXR 11/19.  - Check echocardiogram now that also having new dysrhythmia.  - Maintain adequate oxygenation and normal work of breathing.    New onset atrial Fibrillation with RVR: - Check troponin/ECG prn chest pain.  - Unfortunately, anticoagulant cannot be given at this time due to thrombocytopenia   Tyrone Nine, MD [facility] [email or web address] 06/26/2023, 4:33 PM

## 2023-06-26 NOTE — Progress Notes (Signed)
Progress note  RN called due to patient having increased work of breathing.  Apparently, she went into patient's room due to IV pump that was beeping and noted patient having increased work of breathing with O2 sat in the 80s and HR fluctuating within 50s to 120s.  Supplemental oxygen via [place] was titrated to 3.5 L to maintain O2 sat above 92. ABG was done: ABG    Component Value Date/Time   PHART 7.48 (H) 06/26/2023 0024   PCO2ART 30 (L) 06/26/2023 0024   PO2ART 56 (L) 06/26/2023 0024   HCO3 22.3 06/26/2023 0024   ACIDBASEDEF 0.6 06/26/2023 0024   O2SAT 91.4 06/26/2023 0024   EKG was done and shows A-fib with RVR.  At bedside, respiratory rate was in the low 20s and HR was in the low 100s.  She was in no acute distress.  It was decided for patient to be transferred to stepdown unit.  Assessment and plan Acute respiratory failure with hypoxia Due to the sudden onset of tachycardia and tachypnea and now in acute respiratory failure with hypoxia and considering that patient had a history of LLE DVT in which Xarelto was held due to thrombocytopenia. Pulmonary embolism was suspected, D-dimer was done and this was elevated VQ scan will be done to confirm PE Consider echocardiogram for positive VQ scan confirming PE  New onset atrial Fibrillation with RVR This is possibly secondary to suspected PE Unfortunately, anticoagulant cannot be given at this time due to thrombocytopenia BP in low 100s, consider rate control for persistent atrial fibrillation Continue telemetry Consider cardiology consult prior to discharge  Critical time: 59 minutes   Critical care personally provided  managing the patient due to high probability of clinically significant and life threatening deterioration. This critical care time included obtaining a history; examining the patient, pulse oximetry; ordering and review of studies; arranging urgent treatment with development of a management plan; evaluation of  patient's response of treatment; frequent reassessment; and discussions with other providers.  This critical care time was performed to assess and manage the high probability of imminent and life threatening deterioration that could result in multi-organ failure.

## 2023-06-26 NOTE — Plan of Care (Signed)
  Problem: Education: Goal: Knowledge of General Education information will improve Description Including pain rating scale, medication(s)/side effects and non-pharmacologic comfort measures Outcome: Progressing   

## 2023-06-27 DIAGNOSIS — A419 Sepsis, unspecified organism: Secondary | ICD-10-CM | POA: Diagnosis not present

## 2023-06-27 LAB — HIV ANTIBODY (ROUTINE TESTING W REFLEX): HIV Screen 4th Generation wRfx: NONREACTIVE

## 2023-06-27 LAB — CBC WITH DIFFERENTIAL/PLATELET
Abs Immature Granulocytes: 0 10*3/uL (ref 0.00–0.07)
Basophils Absolute: 0 10*3/uL (ref 0.0–0.1)
Basophils Relative: 0 %
Blasts: 8 %
Eosinophils Absolute: 0 10*3/uL (ref 0.0–0.5)
Eosinophils Relative: 0 %
HCT: 30.6 % — ABNORMAL LOW (ref 36.0–46.0)
Hemoglobin: 9.9 g/dL — ABNORMAL LOW (ref 12.0–15.0)
Lymphocytes Relative: 30 %
Lymphs Abs: 0.5 10*3/uL — ABNORMAL LOW (ref 0.7–4.0)
MCH: 30.4 pg (ref 26.0–34.0)
MCHC: 32.4 g/dL (ref 30.0–36.0)
MCV: 93.9 fL (ref 80.0–100.0)
Metamyelocytes Relative: 2 %
Monocytes Absolute: 0.3 10*3/uL (ref 0.1–1.0)
Monocytes Relative: 18 %
Neutro Abs: 0.7 10*3/uL — ABNORMAL LOW (ref 1.7–7.7)
Neutrophils Relative %: 42 %
Platelets: 47 10*3/uL — ABNORMAL LOW (ref 150–400)
RBC: 3.26 MIL/uL — ABNORMAL LOW (ref 3.87–5.11)
RDW: 18.2 % — ABNORMAL HIGH (ref 11.5–15.5)
WBC: 1.7 10*3/uL — ABNORMAL LOW (ref 4.0–10.5)
nRBC: 0 % (ref 0.0–0.2)

## 2023-06-27 LAB — HEPATITIS PANEL, ACUTE
HCV Ab: NONREACTIVE
Hep A IgM: NONREACTIVE
Hep B C IgM: NONREACTIVE
Hepatitis B Surface Ag: NONREACTIVE

## 2023-06-27 LAB — BASIC METABOLIC PANEL
Anion gap: 9 (ref 5–15)
BUN: 15 mg/dL (ref 8–23)
CO2: 24 mmol/L (ref 22–32)
Calcium: 7.5 mg/dL — ABNORMAL LOW (ref 8.9–10.3)
Chloride: 100 mmol/L (ref 98–111)
Creatinine, Ser: 0.76 mg/dL (ref 0.44–1.00)
GFR, Estimated: >60 mL/min (ref >60)
Glucose, Bld: 109 mg/dL — ABNORMAL HIGH (ref 70–99)
Potassium: 3.5 mmol/L (ref 3.5–5.1)
Sodium: 133 mmol/L — ABNORMAL LOW (ref 135–145)

## 2023-06-27 LAB — LACTATE DEHYDROGENASE: LDH: 449 U/L — ABNORMAL HIGH (ref 98–192)

## 2023-06-27 LAB — URIC ACID: Uric Acid, Serum: 3.2 mg/dL (ref 2.5–7.1)

## 2023-06-27 MED ORDER — PIPERACILLIN-TAZOBACTAM 3.375 G IVPB
3.3750 g | Freq: Three times a day (TID) | INTRAVENOUS | Status: DC
  Administered 2023-06-27 – 2023-07-02 (×15): 3.375 g via INTRAVENOUS
  Filled 2023-06-27 (×15): qty 50

## 2023-06-27 MED ORDER — POTASSIUM CHLORIDE CRYS ER 20 MEQ PO TBCR
40.0000 meq | EXTENDED_RELEASE_TABLET | Freq: Once | ORAL | Status: AC
  Administered 2023-06-27: 40 meq via ORAL
  Filled 2023-06-27: qty 2

## 2023-06-27 MED ORDER — ENOXAPARIN SODIUM 40 MG/0.4ML IJ SOSY
40.0000 mg | PREFILLED_SYRINGE | INTRAMUSCULAR | Status: DC
  Administered 2023-06-27 – 2023-06-30 (×4): 40 mg via SUBCUTANEOUS
  Filled 2023-06-27 (×4): qty 0.4

## 2023-06-27 MED ORDER — METOPROLOL TARTRATE 25 MG PO TABS
12.5000 mg | ORAL_TABLET | Freq: Two times a day (BID) | ORAL | Status: DC
  Administered 2023-06-27 – 2023-06-28 (×3): 12.5 mg via ORAL
  Filled 2023-06-27 (×3): qty 1

## 2023-06-27 NOTE — Progress Notes (Signed)
Physical Therapy Treatment Patient Details Name: Lisa Klein MRN: [id number] DOB: 09-07-1929 Today's Date: 06/27/2023   History of Present Illness Lisa Klein is a 89 y.o. female with medical history significant of hypertension, hyperlipidemia, hypothyroidism, GERD, right leg DVT and pulmonary embolus on Xarelto, stage III(T1 cN2 aM0) left breast IDC follows with Mauro Kaufmann, NP.  She presents to the ED from an urgent care.  Patient complained of 2-day onset of fever, chills, body aches which started after getting COVID shot a few days ago at Dr. Anthony Sar office.  She endorsed vomiting last night.  Patient went to an urgent care today and was told that she had a pneumonia on her chest x-ray, she was advised to go to the ED for further evaluation and management.  Patient lives alone and was able to take care of her ADLs.  She ambulates with a cane and walker.    PT Comments  Pt supine in bed and willing to participate with therapy today.  Pt required mod A with bed mobility, transfer and gait training.  Elevated HOB and raised bed height to assist with bed mobility and gait.  Pt limited by weakness and fatigue, able to ambulate 4L to chair.  EOS left in chair with call bell within reach, RN aware of status.      If plan is discharge home, recommend the following:     Can travel by private vehicle        Equipment Recommendations       Recommendations for Other Services       Precautions / Restrictions Precautions Precautions: Fall Restrictions Weight Bearing Restrictions: No     Mobility  Bed Mobility Overal bed mobility: Needs Assistance Bed Mobility: Supine to Sit     Supine to sit: Mod assist     General bed mobility comments: slow labored movement    Transfers   Equipment used: Rolling walker (2 wheels) Transfers: Sit to/from Stand Sit to Stand: Mod assist           General transfer comment: unsteady on feet with diffiuclty flexing knees during  stand to sitting    Ambulation/Gait Ambulation/Gait assistance: Mod assist Gait Distance (Feet): 4 Feet Assistive device: Rolling walker (2 wheels) Gait Pattern/deviations: Decreased step length - right, Decreased step length - left, Decreased stride length, Knees buckling, Trunk flexed Gait velocity: slow     General Gait Details: limited to a few slow labored unsteady side steps due to c/o fatigue and generalized weakness   Stairs             Wheelchair Mobility     Tilt Bed    Modified Rankin (Stroke Patients Only)       Balance                                            Cognition Arousal: Alert Behavior During Therapy: WFL for tasks assessed/performed Overall Cognitive Status: Within Functional Limits for tasks assessed                                          Exercises      General Comments        Pertinent Vitals/Pain Pain Assessment Pain Assessment: No/denies pain    Home Living  Prior Function            PT Goals (current goals can now be found in the care plan section)      Frequency           PT Plan      Co-evaluation              AM-PAC PT "6 Clicks" Mobility   Outcome Measure  Help needed turning from your back to your side while in a flat bed without using bedrails?: A Lot Help needed moving from lying on your back to sitting on the side of a flat bed without using bedrails?: A Lot Help needed moving to and from a bed to a chair (including a wheelchair)?: A Lot Help needed standing up from a chair using your arms (e.g., wheelchair or bedside chair)?: A Lot Help needed to walk in hospital room?: A Lot Help needed climbing 3-5 steps with a railing? : Total 6 Click Score: 11    End of Session Equipment Utilized During Treatment: Oxygen Activity Tolerance: Patient tolerated treatment well;Patient limited by fatigue Patient left: in chair;with  call bell/phone within reach Nurse Communication: Mobility status       Time: 0750-0807 PT Time Calculation (min) (ACUTE ONLY): 17 min  Charges:    $Therapeutic Activity: 8-22 mins                       Becky Sax, LPTA/CLT; CBIS [phone]  Juel Burrow 06/27/2023, 11:01 AM

## 2023-06-27 NOTE — Plan of Care (Signed)

## 2023-06-27 NOTE — Progress Notes (Signed)
[facility] PROGRESS NOTE  Lisa Klein (DOB: 30-Apr-1930) [id number] PCP: Kerri Perches, MD  Brief Narrative: 89 y.o. female with medical history significant of hypertension, hyperlipidemia, hypothyroidism, GERD, right leg DVT and pulmonary embolus on Xarelto, stage III (T1 cN2 aM0) left breast IDC s/p mastectomy, chemo, hormone Tx admitted on 06/23/23 with Sepsis due to CAP   Subjective: Feels worn out but overall ok. Had some left chest pain worse with palpation while moving around yesterday afternoon, resolved spontaneously. Smell of food is off-putting, no nausea or vomiting just poor oral intake.   Objective: BP (!) 114/57   Pulse 68   Temp 98.5 F (36.9 C) (Oral)   Resp (!) 28   Ht 5\' 4"  (1.626 m)   Wt 81.3 kg   SpO2 97%   BMI 30.77 kg/m   Gen: Elderly frail female in no acute distress Pulm: Clear, nonlabored, diminished  CV: Irreg irreg, rate in 70's-80's, stable LE edema GI: Soft, NT, ND, +BS Neuro: Alert and oriented. No new focal deficits. Ext: Warm, no deformities Skin: No new rashes, lesions or ulcers on visualized skin   Assessment & Plan: Principal Problem:   Sepsis (HCC) Active Problems:   Pulmonary embolism (HCC)   Neutropenia with fever (HCC)   Pancytopenia (HCC)   DVT AND Pulm Embolism-H/o Lt Leg  DVT and PE -----diagnosed 10/19/2022   Acquired hypothyroidism   Mixed hyperlipidemia   Essential hypertension   Infiltrating ductal carcinoma of breast (HCC)   GERD (gastroesophageal reflux disease)   Thrombocytopenia (HCC)   CAP (community acquired pneumonia)   Hypomagnesemia   Hypoalbuminemia due to protein-calorie malnutrition (HCC)   Neutropenia associated with infection (HCC)  SIRS/Neutropenic fevers: Unclear source, though fevers not continuing.  - Oncology consulted, suspect cytopenias are related to an infection. Since no further fevers, Dr. Anders Simmonds recommends against GCSF. - Continue vancomycin, azithromycin. MRSA PCR neg,  consider deescalation if cultures remain negative and pt stabilizes. Expand unasyn to zosyn for pseudomonal coverage.  - Monitor blood cultures (remains NGTD 11/17 and NGTD 11/20)  Acute respiratory failure with hypoxia:  - Negative V/Q scan 11/20, negative CXR 11/19.  - Check echocardiogram now that also having new dysrhythmia. Pending. - Maintain adequate oxygenation and normal work of breathing. Stable on 3L    New onset atrial fibrillation with RVR: No ongoing chest pain, no ischemic ECG changes.  - D/w hematology, will give prophylactic dose anticoagulation while platelets are between 30k and 50k given her hx DVT and now AFib.  - BP seems to be able to support beta blocker, start low dose metoprolol.  - Keep K, Mg replete.  - Continue cardiac monitoring.   History of LLE  DVT and PE 10/19/2022:  - Holding anticoagulation (xarelto PTA) due to persistent thrombocytopenia. D-dimer elevated to 5. - V/Q scan today since she developed AFib with RVR   Pancytopenia: Onc consulted as above.  - Check B12, folate, ferritin, iron TIBC serum copper and methylmalonic acid. All appear replete. Uric acid normal at 3.2, LDH 449, hepatitis panel, HIV pending.    Hypothyroidism: TSH 7.820.  - Continue synthroid. with acute illness, unclear significance of mildly elevated TSH. Certainly would not increase supplementation in wake of new AFib.    GERD:  - Continue PPI   Hypomagnesemia/hypokalemia and hyponatremia: - Supp K for gaol of 4.  - Holding hydrochlorothiazide - Poor po intake contributing, will liberalize diet.   Stage III(T1 cN2 aM0) left breast IDC: s/p modified radical mastectomy on 02/14/2011.  s/p anastrozole April 2013 - April 2023  Weakness and deconditioning--PT eval appreciated recommends SNF rehab  Tyrone Nine, MD [facility] [email or web address] 06/27/2023, 12:36 PM

## 2023-06-27 NOTE — Progress Notes (Signed)
   06/27/23 1501  Spiritual Encounters  Type of Visit Initial  Care provided to: Patient  Referral source Patient request;Family  Reason for visit Advance directives  OnCall Visit No   Chaplain responded to Spiritual Consult for Advance Care Directive.  Upon arriving in the room the Pt was there alone.  Pt expressed that family had been asking for ACD information but they had just left.  Pt was not feeling well therefore was not wanting for me to deliver education.  Chaplain left paperwork for Pt's family to look over. Chaplain also explained to Pt how to reach out to spiritual care with questions or to move forward with paperwork.  No further chaplain services required at this time.

## 2023-06-27 NOTE — Progress Notes (Signed)
Pt has not voided over night. Bladder scan this AM shows 337. This RN offered bedpan however she declines. She reports I just dont want to get up right now. She reports feeling exhausted. [name]

## 2023-06-27 NOTE — Plan of Care (Signed)

## 2023-06-28 ENCOUNTER — Encounter (HOSPITAL_COMMUNITY): Payer: Self-pay | Admitting: Internal Medicine

## 2023-06-28 ENCOUNTER — Inpatient Hospital Stay (HOSPITAL_COMMUNITY): Payer: Medicare HMO

## 2023-06-28 DIAGNOSIS — A419 Sepsis, unspecified organism: Secondary | ICD-10-CM | POA: Diagnosis not present

## 2023-06-28 DIAGNOSIS — I4891 Unspecified atrial fibrillation: Secondary | ICD-10-CM

## 2023-06-28 HISTORY — PX: BONE MARROW ASPIRATION: SHX1252

## 2023-06-28 HISTORY — PX: BONE MARROW BIOPSY: SHX199

## 2023-06-28 LAB — CULTURE, BLOOD (ROUTINE X 2)
Culture: NO GROWTH
Culture: NO GROWTH
Special Requests: ADEQUATE
Special Requests: ADEQUATE

## 2023-06-28 LAB — CBC WITH DIFFERENTIAL/PLATELET
Abs Immature Granulocytes: 0 10*3/uL (ref 0.00–0.07)
Band Neutrophils: 1 %
Basophils Absolute: 0 10*3/uL (ref 0.0–0.1)
Basophils Relative: 0 %
Blasts: 7 %
Eosinophils Absolute: 0 10*3/uL (ref 0.0–0.5)
Eosinophils Relative: 1 %
HCT: 29.6 % — ABNORMAL LOW (ref 36.0–46.0)
Hemoglobin: 9.6 g/dL — ABNORMAL LOW (ref 12.0–15.0)
Lymphocytes Relative: 41 %
Lymphs Abs: 0.8 10*3/uL (ref 0.7–4.0)
MCH: 30.5 pg (ref 26.0–34.0)
MCHC: 32.4 g/dL (ref 30.0–36.0)
MCV: 94 fL (ref 80.0–100.0)
Monocytes Absolute: 0.3 10*3/uL (ref 0.1–1.0)
Monocytes Relative: 13 %
Neutro Abs: 0.8 10*3/uL — ABNORMAL LOW (ref 1.7–7.7)
Neutrophils Relative %: 37 %
Platelets: 48 10*3/uL — ABNORMAL LOW (ref 150–400)
RBC: 3.15 MIL/uL — ABNORMAL LOW (ref 3.87–5.11)
RDW: 18.4 % — ABNORMAL HIGH (ref 11.5–15.5)
WBC: 2 10*3/uL — ABNORMAL LOW (ref 4.0–10.5)
nRBC: 0 % (ref 0.0–0.2)
nRBC: 1 /100{WBCs} — ABNORMAL HIGH

## 2023-06-28 LAB — ECHOCARDIOGRAM COMPLETE
Height: 64 in
MV M vel: 4.78 m/s
MV Peak grad: 91.4 mm[Hg]
S' Lateral: 3.1 cm
Weight: 2867.74 [oz_av]

## 2023-06-28 LAB — BASIC METABOLIC PANEL
Anion gap: 9 (ref 5–15)
BUN: 20 mg/dL (ref 8–23)
CO2: 23 mmol/L (ref 22–32)
Calcium: 7.5 mg/dL — ABNORMAL LOW (ref 8.9–10.3)
Chloride: 101 mmol/L (ref 98–111)
Creatinine, Ser: 1.06 mg/dL — ABNORMAL HIGH (ref 0.44–1.00)
GFR, Estimated: 50 mL/min — ABNORMAL LOW (ref >60)
Glucose, Bld: 128 mg/dL — ABNORMAL HIGH (ref 70–99)
Potassium: 3.9 mmol/L (ref 3.5–5.1)
Sodium: 133 mmol/L — ABNORMAL LOW (ref 135–145)

## 2023-06-28 LAB — COPPER, SERUM: Copper: 154 ug/dL (ref 80–158)

## 2023-06-28 LAB — METHYLMALONIC ACID, SERUM: Methylmalonic Acid, Quantitative: 100 nmol/L (ref 0–378)

## 2023-06-28 MED ORDER — LIDOCAINE HCL 1 % IJ SOLN
5.0000 mL | Freq: Once | INTRAMUSCULAR | Status: AC
  Administered 2023-06-28: 5 mL via INTRADERMAL
  Filled 2023-06-28: qty 5

## 2023-06-28 MED ORDER — ACYCLOVIR 800 MG PO TABS
400.0000 mg | ORAL_TABLET | Freq: Two times a day (BID) | ORAL | Status: DC
  Administered 2023-06-28 – 2023-07-02 (×8): 400 mg via ORAL
  Filled 2023-06-28 (×8): qty 1

## 2023-06-28 MED ORDER — METOPROLOL TARTRATE 25 MG PO TABS
25.0000 mg | ORAL_TABLET | Freq: Two times a day (BID) | ORAL | Status: DC
  Administered 2023-06-28 – 2023-07-02 (×8): 25 mg via ORAL
  Filled 2023-06-28 (×8): qty 1

## 2023-06-28 MED ORDER — ALLOPURINOL 300 MG PO TABS
300.0000 mg | ORAL_TABLET | Freq: Every day | ORAL | Status: DC
  Administered 2023-06-28 – 2023-07-02 (×5): 300 mg via ORAL
  Filled 2023-06-28 (×5): qty 1

## 2023-06-28 NOTE — Progress Notes (Signed)
Patient here today for bone marrow biopsy. Procedure explained and consent signed by all parties at 0945. Patient placed in prone position with both arms above head. Time out conducted at 1011, patient and team agreed. Procedure started at 1021. Patient tolerated procedure well with minimal pain and discomfort. Specimens collected and labeled appropriately. Procedure completed at 1031. Dressing applied and patient reposition on back, head of bed elevated, resting at 1033. Specimens taken to lab for processing. Patient remained stable during procedure and report has been given to Raynelle Fanning, ICU RN.

## 2023-06-28 NOTE — Progress Notes (Signed)
[facility] PROGRESS NOTE  Lisa Klein (DOB: May 14, 1930) WUX:[id number] PCP: Kerri Perches, MD  Brief Narrative: 89 y.o. female with medical history significant of hypertension, hyperlipidemia, hypothyroidism, GERD, right leg DVT and pulmonary embolus on Xarelto, stage III (T1 cN2 aM0) left breast IDC s/p mastectomy, chemo, hormone Tx admitted on 06/23/23 with Sepsis due to CAP. Despite broad IV antibiotics, has continued to have pancytopenia and intermittent fevers with negative cultures. Peripheral smear revealed blast cells and hematology/oncology recommended bone marrow biopsy to confirm suspicion for acute leukemia. Palliative care is also consulted.   Subjective: Felt hot last night, so had temperature checked, 100.48F, but felt better after she took the blanket off. Denies any new symptoms, no cough, dyspnea, chest pain, abd pain, N/V, or dysuria.    Objective: BP 134/67   Pulse (!) 103   Temp 98.4 F (36.9 C) (Oral)   Resp (!) 30   Ht 5\' 4"  (1.626 m)   Wt 81.3 kg   SpO2 96%   BMI 30.77 kg/m   Gen: Elderly frail female in no acute distress Pulm: Clear, nonlabored  CV: RRR, borderline tachycardia.  GI: Soft, NT, ND, +BS Neuro: Alert and oriented. No new focal deficits. Ext: Warm, no deformities  Assessment & Plan: Principal Problem:   Sepsis (HCC) Active Problems:   Pulmonary embolism (HCC)   Neutropenia with fever (HCC)   Pancytopenia (HCC)   DVT AND Pulm Embolism-H/o Lt Leg  DVT and PE -----diagnosed 10/19/2022   Acquired hypothyroidism   Mixed hyperlipidemia   Essential hypertension   Infiltrating ductal carcinoma of breast (HCC)   GERD (gastroesophageal reflux disease)   Thrombocytopenia (HCC)   CAP (community acquired pneumonia)   Hypomagnesemia   Hypoalbuminemia due to protein-calorie malnutrition (HCC)   Neutropenia associated with infection (HCC)  SIRS/Neutropenic fevers: Unclear source, though cultures negative. Recurrent low grade fever  overnight.  - Oncology consulted, initially suspecting cytopenias are related to an infection, though blood smear reveals blasts concerning for acute leukemia. Bone marrow biopsy performed this morning after discussion with patient and family members. Expedited flow cytometry, hoping for answer today.  - If acute leukemia confirmed, would likely not be a candidate for treatment, though would appreciate formal oncology opinion pending that result and palliative care has also been consulted for assistance.   - Continue vancomycin, zosyn, azithromycin. MRSA PCR neg, consider deescalation if cultures remain negative and pt stabilizes. Pt not stable to deescalate.  - Monitor blood cultures (remains NGTD 11/17 and NGTD 11/20)  Acute respiratory failure with hypoxia:  - Negative V/Q scan 11/20, negative CXR 11/19.  - Check echocardiogram now that also having new dysrhythmia. Still pending. - Maintain adequate oxygenation and normal work of breathing. Stable on 3L    New onset atrial fibrillation with RVR: No ongoing chest pain, no ischemic ECG changes.  - D/w hematology, will give prophylactic dose anticoagulation while platelets are between 30k and 50k given her hx DVT and now AFib. This was consented to by pt as well.  - BP stable, HR in 90-100's, still irregular, will increase metoprolol 12.5mg  > 25mg .   - Keep K, Mg replete.  - Continue cardiac monitoring.   History of LLE  DVT and PE 10/19/2022:  - Holding anticoagulation (xarelto PTA) due to persistent thrombocytopenia.  - V/Q scan without evidence of PE. D-dimer elevated to 5.  Pancytopenia: Onc consulted as above.  - Check B12, folate, ferritin, iron TIBC serum copper and methylmalonic acid. All appear replete. Uric acid  normal at 3.2, LDH 449, hepatitis panel negative, HIV NR.    Hypothyroidism: TSH 7.820.  - Continue synthroid. with acute illness, unclear significance of mildly elevated TSH. Certainly would not increase supplementation in  wake of new AFib.    GERD:  - Continue PPI   Hypomagnesemia/hypokalemia and hyponatremia: - Supp K for gaol of 4.  - Holding hydrochlorothiazide - Poor po intake contributing, continue unrestricted diet.   Stage III(T1 cN2 aM0) left breast IDC: s/p modified radical mastectomy on 02/14/2011. s/p anastrozole April 2013 - April 2023  Weakness and deconditioning--PT eval appreciated recommends SNF rehab  AKI: Fluctuating kidney function, though pt's volume status appears nearly normal and she's taking po.  - May stop vancomycin if Cr up again tmrw.   Tyrone Nine, MD [facility] [email or web address] 06/28/2023, 2:12 PM

## 2023-06-28 NOTE — TOC Progression Note (Signed)
Transition of Care [facility]) - Progression Note    Patient Details  Name: EMILIANA HASCALL MRN: [id number] Date of Birth: November 07, 1929  [facility]) CM/SW Contact  Leitha Bleak, RN Phone Number: 06/28/2023, 10:27 AM  Clinical Narrative:     CM spoke with her daughter, DC unknown, patient needs a biopsy next week. Family is planning for home health and will need DME at discharge. TOC reviewed CMS home health choices. Daughter will decide when she is medically ready to DC. Patient will be going to stay with family in [place]. TOC following.   Expected Discharge Plan: Skilled Nursing Facility Barriers to Discharge: No Barriers Identified  Expected Discharge Plan and Services     Post Acute Care Choice: Nursing Home Living arrangements for the past 2 months: Single Family Home                   Social Determinants of Health (SDOH) Interventions SDOH Screenings   Food Insecurity: No Food Insecurity (06/23/2023)  Housing: Patient Declined (06/23/2023)  Transportation Needs: No Transportation Needs (06/23/2023)  Utilities: Not At Risk (06/23/2023)  Alcohol Screen: Low Risk  (07/17/2022)  Depression (PHQ2-9): Medium Risk (04/23/2023)  Financial Resource Strain: Low Risk  (07/17/2022)  Physical Activity: Insufficiently Active (07/17/2022)  Social Connections: Socially Isolated (07/17/2022)  Stress: No Stress Concern Present (07/17/2022)  Tobacco Use: Low Risk  (06/23/2023)    Readmission Risk Interventions    06/25/2023    3:39 PM  Readmission Risk Prevention Plan  Transportation Screening Complete  PCP or Specialist Appt within 5-7 Days Not Complete  Home Care Screening Complete  Medication Review (RN CM) Complete

## 2023-06-28 NOTE — Plan of Care (Signed)

## 2023-06-28 NOTE — Procedures (Signed)
Bone Marrow Biopsy and Aspiration Procedure Note   Informed consent was obtained and potential risks including bleeding, infection and pain were reviewed with the patient. The patient's name, date of birth, identification, consent and allergies were verified prior to the start of procedure and time out was performed.  The left posterior iliac crest was chosen as the site of biopsy.  The skin was prepped with Betadine solution.   8 cc of 1% lidocaine was used to provide local anaesthesia.   10 cc of bone marrow aspirate was obtained followed by 1 inch biopsy.   The procedure was tolerated well and there were no complications.  The patient was stable at the end of the procedure.  Specimens sent for flow cytometry, cytogenetics and additional studies.

## 2023-06-28 NOTE — Consult Note (Signed)
Pharmacy Antibiotic Note  Lisa Klein is a 89 y.o. female admitted on 06/23/2023 with  neutropenic fevers .  They have stage III(T1 cN2 aM0) left breast cancer.  Pharmacy has been consulted for vancomycin dosing.  Today, 06/28/2023 WBC 2 ANC 0.8 Tmax 10.7 Scr up today to 1.0  Cultures:  11/17 Bcx :ng 11/19 Bcx: ngtd MRSA pcr negative  Plan: Day 5 of antibiotics Continue vancomycin 1000 mg IV Q24H. Goal AUC 400-550 Expected AUC: 485.2 Patient is also started on zosyn 11/21 & Azithromycin 500 mg IV Q24H, which ends today  Height: 5\' 4"  (162.6 cm) Weight: 81.3 kg (179 lb 3.7 oz) IBW/kg (Calculated) : 54.7  Temp (24hrs), Avg:98.6 F (37 C), Min:97.6 F (36.4 C), Max:100.7 F (38.2 C)  Recent Labs  Lab 06/23/23 1825 06/24/23 0408 06/25/23 0410 06/26/23 0453 06/27/23 0457 06/28/23 0514  WBC  --  1.4* 1.6* 1.6* 1.7* 2.0*  CREATININE  --  0.79 0.88  0.86 0.96 0.76 1.06*  LATICACIDVEN 1.7  --   --   --   --   --     Estimated Creatinine Clearance: 36.4 mL/min (A) (by C-G formula based on SCr of 1.06 mg/dL (H)).    Allergies  Allergen Reactions   Listerine [Antiseptic Mouth Rinse] Other (See Comments)    Caused sore throat   Singulair [Montelukast Sodium] Other (See Comments)    Abdominal spasm     Thank you for allowing pharmacy to be a part of this patient's care.  Sheppard Coil PharmD., BCPS Clinical Pharmacist 06/28/2023 9:07 AM

## 2023-06-28 NOTE — Consult Note (Signed)
[facility] Consultation Oncology-Follow-up  Name: Lisa Klein      MRN: [id number]    Location: IC12/IC12-01  Date: 06/28/2023 Time:3:01 PM   Interval history: Lisa Klein stated that she has been doing better.  She has no complaints today.  She reported that she has been feeling not so well since September and has been going to her primary care often.  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  Explained in detail the only way to diagnose is a bone marrow biopsy with aspiration.  I also discussed extensively the prognosis with and without treatment and that the chemotherapy would likely be very intense especially considering she did not tolerate chemotherapy for her breast cancer previously very well.  I emphasized that this chemotherapy can cause severe morbidity considering her age and functional status at this time.  I also discussed the same with her daughter [name] in detail.  Patient and daughter wanted to proceed with bone marrow biopsy and aspiration today to confirm the diagnosis.  They would like to discuss after the results if they want to proceed with treatment are not.  PAST MEDICAL HISTORY:   Past Medical History:  Diagnosis Date   Cancer (HCC) 2012   LEFT BREAST   Clotting disorder (HCC) 05/2011   dvt, right leg   Degenerative disc disease    with nerve compression    DJD (degenerative joint disease)    Hypertension    Hypertension    Hypothyroidism    Hypothyroidism    Long term current use of anticoagulant therapy 12/07/2014   Pulmonary embolus    Obesity    Osteopenia 08/24/2014    ALLERGIES: Allergies  Allergen Reactions   Listerine [Antiseptic Mouth Rinse] Other (See Comments)    Caused sore throat   Singulair [Montelukast Sodium] Other (See Comments)    Abdominal spasm       MEDICATIONS: Scheduled:   Chlorhexidine Gluconate Cloth  6 each Topical Daily   dextromethorphan-guaiFENesin  1 tablet Oral BID   enoxaparin (LOVENOX) injection  40 mg Subcutaneous Q24H   feeding supplement  1 Container Oral TID BM   levothyroxine  50 mcg Oral Daily   metoprolol tartrate  25 mg Oral BID   multivitamin with minerals  1 tablet Oral Daily   pantoprazole  40 mg Oral Daily   pravastatin  20 mg Oral q1800       PAST SURGICAL HISTORY Past Surgical History:  Procedure Laterality Date   ABDOMINAL HYSTERECTOMY  1980   fibroids   BONE MARROW ASPIRATION  06/28/2023   BONE MARROW BIOPSY  06/28/2023   BREAST SURGERY  2012   left total mastectomy   CHOLECYSTECTOMY  80'S   [facility]   CHOLECYSTECTOMY     HIP PINNING,CANNULATED Left 01/19/2013   Procedure: CANNULATED HIP PINNING-  left;  Surgeon: Senaida Lange, MD;  Location: [facility] OR;  Service: Orthopedics;  Laterality: Left;   MASTECTOMY MODIFIED RADICAL  02/14/11   left   PORT-A-CATH REMOVAL N/A 12/05/2012   Procedure: MINOR REMOVAL PORT-A-CATH;  Surgeon: Dalia Heading, MD;  Location: [facility] ORS;  Service: General;  Laterality: N/A;  In Minor Room   PORTACATH PLACEMENT  04/16/2011   Procedure: INSERTION PORT-A-CATH;  Surgeon: Dalia Heading;  Location: [facility] ORS;  Service: General;  Laterality: Right;  right subclavian   VESICOVAGINAL FISTULA CLOSURE W/ TAH  [facility]    FAMILY HISTORY: Family History  Problem Relation Age of Onset   COPD Father    Lung disease Father    Hypertension Sister    Arthritis Mother    Hypertension Sister    Heart disease Son        stent   Anesthesia problems Neg Hx    Malignant hyperthermia Neg Hx    Hypotension Neg Hx    Pseudochol deficiency Neg Hx     SOCIAL HISTORY:  reports that she has never smoked. She has never used smokeless tobacco. She reports that she does not drink alcohol and does not use drugs.  PERFORMANCE STATUS: The patient's performance status is 2 - Symptomatic, <50% confined to bed  PHYSICAL EXAM: Most  Recent Vital Signs: Blood pressure 134/67, pulse (!) 103, temperature 98.4 F (36.9 C), temperature source Oral, resp. rate (!) 30, height 5\' 4"  (1.626 m), weight 179 lb 3.7 oz (81.3 kg), SpO2 96%.  GENERAL:alert, no distress and comfortable LYMPH:  no palpable lymphadenopathy in the cervical, axillary or inguinal LUNGS: clear to auscultation and percussion with normal breathing effort HEART: regular rate & rhythm and no murmurs and no lower extremity edema ABDOMEN:abdomen soft, non-tender and normal bowel sounds Musculoskeletal:no cyanosis of digits and no clubbing  PSYCH: alert & oriented x 3 with fluent speech   LABORATORY DATA:  Results for orders placed or performed during the hospital encounter of 06/23/23 (from the past 48 hour(s))  Troponin I (High Sensitivity)     Status: Abnormal   Collection Time: 06/26/23  5:13 PM  Result Value Ref Range   Troponin I (High Sensitivity) 78 (H) <18 ng/L    Comment: (NOTE) Elevated high sensitivity troponin I (hsTnI) values and significant  changes across serial measurements may suggest ACS but many other  chronic and acute conditions are known to elevate hsTnI results.  Refer to the "Links" section for chest pain algorithms and additional  guidance. Performed at [facility], [address], [place], [place] [address]   CBC with Differential/Platelet     Status: Abnormal   Collection Time: 06/27/23  4:57 AM  Result Value Ref Range   WBC 1.7 (L) 4.0 - 10.5 K/uL   RBC 3.26 (L) 3.87 - 5.11 MIL/uL   Hemoglobin 9.9 (L) 12.0 - 15.0 g/dL   HCT 19.1 (L) 47.8 - 29.5 %   MCV 93.9 80.0 - 100.0 fL   MCH 30.4 26.0 - 34.0 pg   MCHC 32.4 30.0 - 36.0 g/dL   RDW 62.1 (H) 30.8 - 65.7 %   Platelets 47 (L) 150 - 400 K/uL    Comment: SPECIMEN CHECKED FOR CLOTS Immature Platelet Fraction may be clinically indicated, consider ordering this additional test [id number] REPEATED TO VERIFY PLATELET COUNT CONFIRMED BY SMEAR    nRBC 0.0 0.0 - 0.2 %    Neutrophils Relative % 42 %   Neutro Abs 0.7 (L) 1.7 - 7.7 K/uL   Lymphocytes Relative 30 %   Lymphs Abs 0.5 (L) 0.7 - 4.0 K/uL   Monocytes Relative 18 %   Monocytes Absolute 0.3 0.1 - 1.0 K/uL   Eosinophils Relative 0 %   Eosinophils Absolute 0.0 0.0 - 0.5 K/uL   Basophils Relative 0 %   Basophils Absolute 0.0 0.0 - 0.1 K/uL   WBC Morphology Mild Left Shift (1-5% metas, occ myelo)    RBC Morphology See Note     Comment: ANISOCYTOSIS   Smear Review Reviewed    Metamyelocytes Relative  2 %   Blasts 8 %   Abs Immature Granulocytes 0.00 0.00 - 0.07 K/uL    Comment: Performed at [facility], [address] [place], [place] [address]  Basic metabolic panel     Status: Abnormal   Collection Time: 06/27/23  4:57 AM  Result Value Ref Range   Sodium 133 (L) 135 - 145 mmol/L   Potassium 3.5 3.5 - 5.1 mmol/L   Chloride 100 98 - 111 mmol/L   CO2 24 22 - 32 mmol/L   Glucose, Bld 109 (H) 70 - 99 mg/dL    Comment: Glucose reference range applies only to samples taken after fasting for at least 8 hours.   BUN 15 8 - 23 mg/dL   Creatinine, Ser 3.08 0.44 - 1.00 mg/dL   Calcium 7.5 (L) 8.9 - 10.3 mg/dL   GFR, Estimated >65 >78 mL/min    Comment: (NOTE) Calculated using the CKD-EPI Creatinine Equation (2021)    Anion gap 9 5 - 15    Comment: Performed at [facility], [address], [place], [place] [address]  Lactate dehydrogenase     Status: Abnormal   Collection Time: 06/27/23  8:20 AM  Result Value Ref Range   LDH 449 (H) 98 - 192 U/L    Comment: Performed at [facility], [address], [place], [place] [address]  Uric acid     Status: None   Collection Time: 06/27/23  8:20 AM  Result Value Ref Range   Uric Acid, Serum 3.2 2.5 - 7.1 mg/dL    Comment: HEMOLYSIS AT THIS LEVEL MAY AFFECT RESULT Performed at [facility], [address], [place], [place] [address]   Hepatitis panel, acute     Status: None   Collection Time: 06/27/23  8:20 AM  Result Value Ref Range   Hepatitis B  Surface Ag NON REACTIVE NON REACTIVE   HCV Ab NON REACTIVE NON REACTIVE    Comment: (NOTE) Nonreactive HCV antibody screen is consistent with no HCV infections,  unless recent infection is suspected or other evidence exists to indicate HCV infection.     Hep A IgM NON REACTIVE NON REACTIVE   Hep B C IgM NON REACTIVE NON REACTIVE    Comment: Performed at [facility], [address] [place], [place] [address]  HIV Antibody (routine testing w rflx)     Status: None   Collection Time: 06/27/23  8:20 AM  Result Value Ref Range   HIV Screen 4th Generation wRfx Non Reactive Non Reactive    Comment: Performed at [facility], [address] [place], [place] [address]  CBC with Differential/Platelet     Status: Abnormal   Collection Time: 06/28/23  5:14 AM  Result Value Ref Range   WBC 2.0 (L) 4.0 - 10.5 K/uL   RBC 3.15 (L) 3.87 - 5.11 MIL/uL   Hemoglobin 9.6 (L) 12.0 - 15.0 g/dL   HCT 44.0 (L) 34.7 - 42.5 %   MCV 94.0 80.0 - 100.0 fL   MCH 30.5 26.0 - 34.0 pg   MCHC 32.4 30.0 - 36.0 g/dL   RDW 95.6 (H) 38.7 - 56.4 %   Platelets 48 (L) 150 - 400 K/uL    Comment: SPECIMEN CHECKED FOR CLOTS Immature Platelet Fraction may be clinically indicated, consider ordering this additional test [id number] CONSISTENT WITH PREVIOUS RESULT REPEATED TO VERIFY    nRBC 0.0 0.0 - 0.2 %   Neutrophils Relative % 37 %   Neutro Abs 0.8 (  L) 1.7 - 7.7 K/uL   Band Neutrophils 1 %   Lymphocytes Relative 41 %   Lymphs Abs 0.8 0.7 - 4.0 K/uL   Monocytes Relative 13 %   Monocytes Absolute 0.3 0.1 - 1.0 K/uL   Eosinophils Relative 1 %   Eosinophils Absolute 0.0 0.0 - 0.5 K/uL   Basophils Relative 0 %   Basophils Absolute 0.0 0.0 - 0.1 K/uL   WBC Morphology See Note     Comment: REACTIVE LYMPHOCYTES PRESENT   RBC Morphology See Note     Comment: ANISOCYTOSIS   Smear Review See Note     Comment: LARGE PLATELETS   nRBC 1 (H) 0 /100 WBC   Blasts 7 %   Abs Immature Granulocytes 0.00 0.00 -  0.07 K/uL   Burr Cells PRESENT    Ovalocytes PRESENT     Comment: Performed at [facility], [address], [place], [place] [address]  Basic metabolic panel     Status: Abnormal   Collection Time: 06/28/23  5:14 AM  Result Value Ref Range   Sodium 133 (L) 135 - 145 mmol/L   Potassium 3.9 3.5 - 5.1 mmol/L   Chloride 101 98 - 111 mmol/L   CO2 23 22 - 32 mmol/L   Glucose, Bld 128 (H) 70 - 99 mg/dL    Comment: Glucose reference range applies only to samples taken after fasting for at least 8 hours.   BUN 20 8 - 23 mg/dL   Creatinine, Ser 4.09 (H) 0.44 - 1.00 mg/dL   Calcium 7.5 (L) 8.9 - 10.3 mg/dL   GFR, Estimated 50 (L) >60 mL/min    Comment: (NOTE) Calculated using the CKD-EPI Creatinine Equation (2021)    Anion gap 9 5 - 15    Comment: Performed at [facility], [address] [place], [place] [address]         ASSESSMENT:  Patient is a 89 year old female with past medical history of breast cancer s/p mastectomy, chemotherapy and 10 years of endocrine therapy.  Patient is currently admitted for sepsis secondary to community-acquired pneumonia and neutropenia.  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  We proceeded with bone marrow biopsy and aspiration at bedside today and sent the specimen for flow cytometry, FISH, cytogenetics and myeloid NGS.  -Will await pathology results at this time -Patient is aware of the prognosis and treatment plan in case this is leukemia and would like to discuss with the family -Please do not administer G-CSF before the path results are back -Continue broad-spectrum antibiotics -Please start allopurinol 300 mg daily and acyclovir 400 mg twice daily for prophylaxis -Monitor for tumor lysis with daily CBC with differential, CMP, LDH  and uric acid -Appreciate the care by the primary team  2.  History of breast cancer: Patient has a history of breast cancer s/p radical mastectomy and adjuvant chemotherapy.  Patient completed 10 years of hormone therapy and currently on surveillance.  Relapse of disease is low on differential at this time.   All questions were answered in detail.  Thank you for involving [place] in this patient's care and please reach out to me with any questions and concerns   [facility] Hematology oncology [facility] at [facility]

## 2023-06-28 NOTE — Progress Notes (Signed)
  Echocardiogram 2D Echocardiogram has been performed.  Delcie Roch 06/28/2023, 4:14 PM

## 2023-06-29 DIAGNOSIS — A419 Sepsis, unspecified organism: Secondary | ICD-10-CM | POA: Diagnosis not present

## 2023-06-29 LAB — BASIC METABOLIC PANEL WITH GFR
Anion gap: 10 (ref 5–15)
BUN: 22 mg/dL (ref 8–23)
CO2: 23 mmol/L (ref 22–32)
Calcium: 7.5 mg/dL — ABNORMAL LOW (ref 8.9–10.3)
Chloride: 104 mmol/L (ref 98–111)
Creatinine, Ser: 1.12 mg/dL — ABNORMAL HIGH (ref 0.44–1.00)
GFR, Estimated: 47 mL/min — ABNORMAL LOW
Glucose, Bld: 122 mg/dL — ABNORMAL HIGH (ref 70–99)
Potassium: 3.7 mmol/L (ref 3.5–5.1)
Sodium: 137 mmol/L (ref 135–145)

## 2023-06-29 LAB — CBC WITH DIFFERENTIAL/PLATELET
Abs Immature Granulocytes: 0 10*3/uL (ref 0.00–0.07)
Band Neutrophils: 1 %
Basophils Absolute: 0 10*3/uL (ref 0.0–0.1)
Basophils Relative: 0 %
Blasts: 4 %
Eosinophils Absolute: 0.1 10*3/uL (ref 0.0–0.5)
Eosinophils Relative: 4 %
HCT: 26.8 % — ABNORMAL LOW (ref 36.0–46.0)
Hemoglobin: 9.2 g/dL — ABNORMAL LOW (ref 12.0–15.0)
Lymphocytes Relative: 33 %
Lymphs Abs: 0.7 10*3/uL (ref 0.7–4.0)
MCH: 32.2 pg (ref 26.0–34.0)
MCHC: 34.3 g/dL (ref 30.0–36.0)
MCV: 93.7 fL (ref 80.0–100.0)
Monocytes Absolute: 0.1 10*3/uL (ref 0.1–1.0)
Monocytes Relative: 5 %
Neutro Abs: 1.1 10*3/uL — ABNORMAL LOW (ref 1.7–7.7)
Neutrophils Relative %: 53 %
Platelets: 57 10*3/uL — ABNORMAL LOW (ref 150–400)
RBC: 2.86 MIL/uL — ABNORMAL LOW (ref 3.87–5.11)
RDW: 18.6 % — ABNORMAL HIGH (ref 11.5–15.5)
WBC: 2.1 10*3/uL — ABNORMAL LOW (ref 4.0–10.5)
nRBC: 1 /100{WBCs} — ABNORMAL HIGH
nRBC: 1.4 % — ABNORMAL HIGH (ref 0.0–0.2)

## 2023-06-29 LAB — URIC ACID: Uric Acid, Serum: 2 mg/dL — ABNORMAL LOW (ref 2.5–7.1)

## 2023-06-29 LAB — LACTATE DEHYDROGENASE: LDH: 461 U/L — ABNORMAL HIGH (ref 98–192)

## 2023-06-29 MED ORDER — ACETAMINOPHEN 650 MG RE SUPP: 650.0000 mg | Freq: Four times a day (QID) | RECTAL | Status: DC | PRN

## 2023-06-29 MED ORDER — ACETAMINOPHEN 325 MG PO TABS
650.0000 mg | ORAL_TABLET | Freq: Four times a day (QID) | ORAL | Status: DC | PRN
  Administered 2023-06-29 – 2023-07-02 (×2): 650 mg via ORAL
  Filled 2023-06-29: qty 2

## 2023-06-29 MED ORDER — SODIUM CHLORIDE 0.9 % IV SOLN: INTRAVENOUS | Status: DC

## 2023-06-29 NOTE — Progress Notes (Signed)
[facility] PROGRESS NOTE  Lisa Klein (DOB: 12-Jan-1930) UXL:[id number] PCP: Kerri Perches, MD  Brief Narrative: 89 y.o. female with medical history significant of hypertension, hyperlipidemia, hypothyroidism, GERD, right leg DVT and pulmonary embolus on Xarelto, stage III (T1 cN2 aM0) left breast IDC s/p mastectomy, chemo, hormone Tx admitted on 06/23/23 with Sepsis due to CAP. Despite broad IV antibiotics, has continued to have pancytopenia and intermittent fevers with negative cultures. Peripheral smear revealed blast cells and hematology/oncology recommended bone marrow biopsy to confirm suspicion for acute leukemia. Palliative care is also consulted.   Subjective: Feels ok, just very fatigued with poor appetite bordering on food aversion. Having to suction saliva. We spoke at great length this morning.   Objective: BP (!) 113/57   Pulse 100   Temp 97.9 F (36.6 C) (Oral)   Resp (!) 24   Ht 5\' 4"  (1.626 m)   Wt 81.3 kg   SpO2 94%   BMI 30.77 kg/m   Gen: Frail elderly female in no acute distress.  Pulm: Clear, nonlabored  CV: Irreg, rate in 80's, no MRG, no pitting edema, no JVD GI: Soft, NT, ND, +BS Neuro: Alert and oriented. No new focal deficits. Ext: Warm, no deformities. Skin: No open wounds on visualized skin   Assessment & Plan: Principal Problem:   Sepsis (HCC) Active Problems:   Pulmonary embolism (HCC)   Neutropenia with fever (HCC)   Pancytopenia (HCC)   DVT AND Pulm Embolism-H/o Lt Leg  DVT and PE -----diagnosed 10/19/2022   Acquired hypothyroidism   Mixed hyperlipidemia   Essential hypertension   Infiltrating ductal carcinoma of breast (HCC)   GERD (gastroesophageal reflux disease)   Thrombocytopenia (HCC)   CAP (community acquired pneumonia)   Hypomagnesemia   Hypoalbuminemia due to protein-calorie malnutrition (HCC)   Neutropenia associated with infection (HCC)  Goals of care:  - Await definitive diagnoses to guide decision making,  though pt is considering discharge home with her sister soon.  - Confirmed patient would want to avoid attempt at resuscitation were she to have suffered cardiac/respiratory arrest. All pre-arrest interventions remain on the table though. DNR status updated in EMR.  - Palliative care to assist   SIRS/Neutropenic fevers: Unclear source, though cultures negative. Recurrent low grade fever overnight.  - Oncology consulted, initially suspecting cytopenias are related to an infection, though blood smear reveals blasts concerning for acute leukemia. Bone marrow biopsy 11/22, no surg path/flow cytometry available at this time.  - If acute leukemia confirmed, would likely not be a candidate for treatment, though would appreciate formal oncology opinion pending that result and palliative care has also been consulted for assistance. May be Monday before we get answer.  - Continue vancomycin, zosyn, azithromycin. MRSA PCR neg, consider deescalation if cultures remain negative and pt stabilizes. Pt not stable to deescalate.  - Monitor blood cultures (negative from 11/17 and NG4D 11/20)  Acute respiratory failure with hypoxia:  - Negative V/Q scan 11/20, negative CXR 11/19.  - Maintain adequate oxygenation and normal work of breathing. Stable on 3L    New onset atrial fibrillation with RVR: No ongoing chest pain, no ischemic ECG changes.  - D/w hematology, will give prophylactic dose anticoagulation while platelets are between 30k and 50k given her hx DVT and now AFib. This was consented to by pt as well. If plt's up tomorrow, likely escalate to full dose anticoagulation.  - Continue metoprolol 25mg .   - Keep K, Mg replete.  - Continue cardiac monitoring.  Chronic HFrEF w/RV impairment as well: Echo 11/22 also showed mild MR, trivial AI.  - Judicious IVF - This further worsens overall prognosis. Given her precarious clinical state at this time, we will defer initiation of GDMT.   Echogenic focus in left  atrium: By TTE. This is new from prior < 2 years ago. DDx includes thickened IAS, mass, thrombus.  - TEE will be considered based on global goals of care discussions. Patient is well aware of this finding and the possibilities it entails including embolism.   History of LLE  DVT and PE 10/19/2022:  - Holding anticoagulation (xarelto PTA) due to persistent thrombocytopenia.  - V/Q scan without evidence of PE. D-dimer elevated to 5.  Pancytopenia: Onc consulted as above.  - B12, folate, ferritin, iron TIBC serum copper and methylmalonic acid all appear replete. Uric acid normal at 3.2, LDH 449, hepatitis panel negative, HIV NR.    Hypothyroidism: TSH 7.820.  - Continue synthroid. with acute illness, unclear significance of mildly elevated TSH. Certainly would not increase supplementation in wake of new AFib.    GERD:  - Continue PPI   Hypomagnesemia/hypokalemia and hyponatremia: - Supp K for goal of 4.  - Holding hydrochlorothiazide - Poor po intake contributing, continue unrestricted diet. IVF as below   Stage III(T1 cN2 aM0) left breast IDC: s/p modified radical mastectomy on 02/14/2011. s/p anastrozole April 2013 - April 2023  Weakness and deconditioning--PT eval appreciated recommends SNF rehab  AKI: Fluctuating kidney function, may not be taking adequate po.  - Give 1L NS over course of the day.  Tyrone Nine, MD [facility] [email or web address] 06/29/2023, 1:46 PM

## 2023-06-30 ENCOUNTER — Encounter: Payer: Self-pay | Admitting: Oncology

## 2023-06-30 DIAGNOSIS — A419 Sepsis, unspecified organism: Secondary | ICD-10-CM | POA: Diagnosis not present

## 2023-06-30 LAB — CBC WITH DIFFERENTIAL/PLATELET
Abs Immature Granulocytes: 0 10*3/uL (ref 0.00–0.07)
Basophils Absolute: 0 10*3/uL (ref 0.0–0.1)
Basophils Relative: 0 %
Blasts: 6 %
Eosinophils Absolute: 0 10*3/uL (ref 0.0–0.5)
Eosinophils Relative: 2 %
HCT: 30.5 % — ABNORMAL LOW (ref 36.0–46.0)
Hemoglobin: 9.8 g/dL — ABNORMAL LOW (ref 12.0–15.0)
Lymphocytes Relative: 41 %
Lymphs Abs: 0.9 10*3/uL (ref 0.7–4.0)
MCH: 30.6 pg (ref 26.0–34.0)
MCHC: 32.1 g/dL (ref 30.0–36.0)
MCV: 95.3 fL (ref 80.0–100.0)
Monocytes Absolute: 0.2 10*3/uL (ref 0.1–1.0)
Monocytes Relative: 8 %
Neutro Abs: 1 10*3/uL — ABNORMAL LOW (ref 1.7–7.7)
Neutrophils Relative %: 43 %
Platelets: 56 10*3/uL — ABNORMAL LOW (ref 150–400)
RBC: 3.2 MIL/uL — ABNORMAL LOW (ref 3.87–5.11)
RDW: 18.6 % — ABNORMAL HIGH (ref 11.5–15.5)
WBC: 2.3 10*3/uL — ABNORMAL LOW (ref 4.0–10.5)
nRBC: 2.7 % — ABNORMAL HIGH (ref 0.0–0.2)

## 2023-06-30 LAB — BASIC METABOLIC PANEL
Anion gap: 11 (ref 5–15)
BUN: 23 mg/dL (ref 8–23)
CO2: 22 mmol/L (ref 22–32)
Calcium: 7.6 mg/dL — ABNORMAL LOW (ref 8.9–10.3)
Chloride: 105 mmol/L (ref 98–111)
Creatinine, Ser: 1.15 mg/dL — ABNORMAL HIGH (ref 0.44–1.00)
GFR, Estimated: 45 mL/min — ABNORMAL LOW (ref >60)
Glucose, Bld: 98 mg/dL (ref 70–99)
Potassium: 3.7 mmol/L (ref 3.5–5.1)
Sodium: 138 mmol/L (ref 135–145)

## 2023-06-30 LAB — CULTURE, BLOOD (ROUTINE X 2)
Culture: NO GROWTH
Culture: NO GROWTH

## 2023-06-30 LAB — VANCOMYCIN, TROUGH: Vancomycin Tr: 8 ug/mL — ABNORMAL LOW (ref 15–20)

## 2023-06-30 LAB — LACTATE DEHYDROGENASE: LDH: 425 U/L — ABNORMAL HIGH (ref 98–192)

## 2023-06-30 LAB — URIC ACID: Uric Acid, Serum: <1.5 mg/dL — ABNORMAL LOW (ref 2.5–7.1)

## 2023-06-30 MED ORDER — DEXTROSE-SODIUM CHLORIDE 5-0.45 % IV SOLN: INTRAVENOUS | Status: DC

## 2023-06-30 MED ORDER — ENOXAPARIN SODIUM 80 MG/0.8ML IJ SOSY
80.0000 mg | PREFILLED_SYRINGE | Freq: Two times a day (BID) | INTRAMUSCULAR | Status: DC
  Administered 2023-06-30 – 2023-07-02 (×4): 80 mg via SUBCUTANEOUS
  Filled 2023-06-30 (×4): qty 0.8

## 2023-06-30 MED ORDER — VANCOMYCIN HCL 1250 MG/250ML IV SOLN
1250.0000 mg | INTRAVENOUS | Status: DC
Start: 2023-07-01 — End: 2023-07-02
  Administered 2023-07-01: 1250 mg via INTRAVENOUS
  Filled 2023-06-30: qty 250

## 2023-06-30 NOTE — Plan of Care (Signed)
  Problem: Education: Goal: Knowledge of General Education information will improve Description: Including pain rating scale, medication(s)/side effects and non-pharmacologic comfort measures Outcome: Not Progressing   Problem: Health Behavior/Discharge Planning: Goal: Ability to manage health-related needs will improve Outcome: Progressing   Problem: Clinical Measurements: Goal: Ability to maintain clinical measurements within normal limits will improve Outcome: Progressing Goal: Will remain free from infection Outcome: Progressing Goal: Diagnostic test results will improve Outcome: Progressing Goal: Respiratory complications will improve Outcome: Progressing Goal: Cardiovascular complication will be avoided Outcome: Progressing   Problem: Activity: Goal: Risk for activity intolerance will decrease Outcome: Not Progressing   Problem: Nutrition: Goal: Adequate nutrition will be maintained Outcome: Not Progressing   Problem: Coping: Goal: Level of anxiety will decrease Outcome: Progressing   Problem: Elimination: Goal: Will not experience complications related to bowel motility Outcome: Progressing Goal: Will not experience complications related to urinary retention Outcome: Progressing   Problem: Pain Management: Goal: General experience of comfort will improve Outcome: Progressing   Problem: Safety: Goal: Ability to remain free from injury will improve Outcome: Progressing   Problem: Skin Integrity: Goal: Risk for impaired skin integrity will decrease Outcome: Progressing   Problem: Activity: Goal: Ability to tolerate increased activity will improve Outcome: Not Progressing   Problem: Clinical Measurements: Goal: Ability to maintain a body temperature in the normal range will improve Outcome: Progressing

## 2023-06-30 NOTE — Progress Notes (Signed)
[facility] PROGRESS NOTE  Lisa Klein (DOB: Jan 21, 1930) ZOX:[id number] PCP: Kerri Perches, MD  Brief Narrative: 89 y.o. female with medical history significant of hypertension, hyperlipidemia, hypothyroidism, GERD, right leg DVT and pulmonary embolus on Xarelto, stage III (T1 cN2 aM0) left breast IDC s/p mastectomy, chemo, hormone Tx admitted on 06/23/23 with Sepsis due to CAP. Despite broad IV antibiotics, has continued to have pancytopenia and intermittent fevers with negative cultures. Peripheral smear revealed blast cells and hematology/oncology recommended bone marrow biopsy to confirm suspicion for acute leukemia. Palliative care is also consulted.   Subjective: No new complaints.   Objective: BP 118/61   Pulse 86   Temp 98.1 F (36.7 C) (Oral)   Resp (!) 26   Ht 5\' 4"  (1.626 m)   Wt 83.5 kg   SpO2 97%   BMI 31.60 kg/m   Gen: No distress, elderly Pulm: Clear, nonlabored anterolaterally  CV: Irreg irreg, rate in 80's, no significant pitting edema GI: Soft, NT, ND, +BS Neuro: Alert and oriented. No new focal deficits. Ext: Warm, no deformities. Skin: No new rashes, lesions or ulcers on visualized skin   Assessment & Plan: Principal Problem:   Sepsis (HCC) Active Problems:   Pulmonary embolism (HCC)   Neutropenia with fever (HCC)   Pancytopenia (HCC)   DVT AND Pulm Embolism-H/o Lt Leg  DVT and PE -----diagnosed 10/19/2022   Acquired hypothyroidism   Mixed hyperlipidemia   Essential hypertension   Infiltrating ductal carcinoma of breast (HCC)   GERD (gastroesophageal reflux disease)   Thrombocytopenia (HCC)   CAP (community acquired pneumonia)   Hypomagnesemia   Hypoalbuminemia due to protein-calorie malnutrition (HCC)   Neutropenia associated with infection (HCC)  Goals of care:  - Await definitive diagnoses to guide decision making, though pt is considering discharge home with her sister soon.  - Confirmed patient would want to avoid attempt at  resuscitation were she to have suffered cardiac/respiratory arrest. All pre-arrest interventions remain on the table though. DNR status updated in EMR.  - Palliative care to assist   SIRS/Neutropenic fevers: Unclear source, though cultures negative. Recurrent low grade fever overnight.  - Oncology consulted, initially suspecting cytopenias are related to an infection, though blood smear reveals blasts concerning for acute leukemia. Bone marrow biopsy 11/22, no surg path/flow cytometry available at this time.  - If acute leukemia confirmed, would likely not be a candidate for treatment, though would appreciate formal oncology opinion pending that result and palliative care has also been consulted for assistance. May be Monday before we get answer.  - Continue vancomycin, zosyn, azithromycin. MRSA PCR neg, consider deescalation if cultures remain negative and pt stabilizes. Pt not stable to deescalate.  - Monitor blood cultures (negative from 11/17 and NG4D 11/20)  Acute respiratory failure with hypoxia:  - Negative V/Q scan 11/20, negative CXR 11/19.  - Maintain adequate oxygenation and normal work of breathing. Stable on 3L    New onset atrial fibrillation with RVR: No ongoing chest pain, no ischemic ECG changes.  - Will start full dose anticoagulation now that platelets are durably > 50k given her hx DVT and now AFib. - Continue metoprolol 25mg .   - Keep K, Mg replete (check Mg in AM).  - Continue cardiac monitoring.   Chronic HFrEF w/RV impairment as well: Echo 11/22 also showed mild MR, trivial AI.  - Judicious IVF (50cc/hr) - This further worsens overall prognosis. Given her precarious clinical state at this time, we will defer initiation of GDMT.  Echogenic focus in left atrium: By TTE. This is new from prior < 2 years ago. DDx includes thickened IAS, mass, thrombus.  - TEE will be considered based on global goals of care discussions. Patient is well aware of this finding and the  possibilities it entails including embolism.  - Lovenox 1mg /kg q12h  History of LLE  DVT and PE 10/19/2022:  - Holding anticoagulation (xarelto PTA) due to persistent thrombocytopenia.  - V/Q scan without evidence of PE. D-dimer elevated to 5.  Pancytopenia: Onc consulted as above.  - B12, folate, ferritin, iron TIBC serum copper and methylmalonic acid all appear replete. Uric acid normal at 3.2, LDH 449, hepatitis panel negative, HIV NR.    Hypothyroidism: TSH 7.820.  - Continue synthroid. with acute illness, unclear significance of mildly elevated TSH. Certainly would not increase supplementation in wake of new AFib.    GERD:  - Continue PPI   Hypomagnesemia/hypokalemia and hyponatremia: - Supp K for goal of 4.  - Holding hydrochlorothiazide - Poor po intake contributing, continue unrestricted diet. IVF as below   Stage III(T1 cN2 aM0) left breast IDC: s/p modified radical mastectomy on 02/14/2011. s/p anastrozole April 2013 - April 2023  Weakness and deconditioning--PT eval appreciated recommends SNF rehab  AKI: Fluctuating kidney function, may not be taking adequate po.  - Give 1L NS over course of the day.  Tyrone Nine, MD [facility] [email or web address] 06/30/2023, 9:19 AM

## 2023-06-30 NOTE — Consult Note (Addendum)
Pharmacy Antibiotic Note  Lisa Klein is a 89 y.o. female admitted on 06/23/2023 with  neutropenic fevers . PMH significant for stage III (T1 cN2 aM0) left breast cancer, HLD, HTN. Pharmacy has been consulted for vancomycin dosing.  Plan: Day 8 of antibiotics Increase to vancomycin 1250 mg IV Q24H. Goal AUC 400-550. Expected AUC: 433 SCr used: 1.15  Patient is also on Zosyn 3.375 g IV Q8H Continue to monitor renal function and follow culture results  Height: 5\' 4"  (162.6 cm) Weight: 83.5 kg (184 lb 1.4 oz) IBW/kg (Calculated) : 54.7  Temp (24hrs), Avg:98.3 F (36.8 C), Min:97.9 F (36.6 C), Max:98.8 F (37.1 C)  Recent Labs  Lab 06/26/23 0453 06/27/23 0457 06/28/23 0514 06/29/23 0432 06/30/23 0421 06/30/23 1948  WBC 1.6* 1.7* 2.0* 2.1* 2.3*  --   CREATININE 0.96 0.76 1.06* 1.12* 1.15*  --   VANCOTROUGH  --   --   --   --   --  8*    Estimated Creatinine Clearance: 34 mL/min (A) (by C-G formula based on SCr of 1.15 mg/dL (H)).    Allergies  Allergen Reactions   Listerine [Antiseptic Mouth Rinse] Other (See Comments)    Caused sore throat   Singulair [Montelukast Sodium] Other (See Comments)    Abdominal spasm    Antimicrobials this admission: 11/17 Unasyn >> 11/21 11/17 Azithromycin >> 11/22 11/19 Vancomycin >> 11/21 Zosyn >>   Microbiology results: 11/17 BCx: NG final 11/19 BCx: NG final 11/20 MRSA PCR: negative  Thank you for allowing pharmacy to be a part of this patient's care.  Celene Squibb, PharmD Clinical Pharmacist 06/30/2023 9:35 PM

## 2023-07-01 ENCOUNTER — Encounter (HOSPITAL_COMMUNITY): Payer: Self-pay | Admitting: Internal Medicine

## 2023-07-01 DIAGNOSIS — A419 Sepsis, unspecified organism: Secondary | ICD-10-CM | POA: Diagnosis not present

## 2023-07-01 DIAGNOSIS — C9200 Acute myeloblastic leukemia, not having achieved remission: Secondary | ICD-10-CM

## 2023-07-01 DIAGNOSIS — Z515 Encounter for palliative care: Secondary | ICD-10-CM

## 2023-07-01 DIAGNOSIS — Z7189 Other specified counseling: Secondary | ICD-10-CM

## 2023-07-01 LAB — BASIC METABOLIC PANEL
Anion gap: 9 (ref 5–15)
BUN: 18 mg/dL (ref 8–23)
CO2: 24 mmol/L (ref 22–32)
Calcium: 7.5 mg/dL — ABNORMAL LOW (ref 8.9–10.3)
Chloride: 102 mmol/L (ref 98–111)
Creatinine, Ser: 1.11 mg/dL — ABNORMAL HIGH (ref 0.44–1.00)
GFR, Estimated: 47 mL/min — ABNORMAL LOW (ref >60)
Glucose, Bld: 109 mg/dL — ABNORMAL HIGH (ref 70–99)
Potassium: 3.3 mmol/L — ABNORMAL LOW (ref 3.5–5.1)
Sodium: 135 mmol/L (ref 135–145)

## 2023-07-01 LAB — HEMOGLOBIN AND HEMATOCRIT, BLOOD
HCT: 31.6 % — ABNORMAL LOW (ref 36.0–46.0)
Hemoglobin: 10 g/dL — ABNORMAL LOW (ref 12.0–15.0)

## 2023-07-01 LAB — CBC WITH DIFFERENTIAL/PLATELET
Abs Immature Granulocytes: 0 10*3/uL (ref 0.00–0.07)
Band Neutrophils: 3 %
Basophils Absolute: 0 10*3/uL (ref 0.0–0.1)
Basophils Relative: 0 %
Blasts: 4 %
Eosinophils Absolute: 0 10*3/uL (ref 0.0–0.5)
Eosinophils Relative: 1 %
HCT: 24.7 % — ABNORMAL LOW (ref 36.0–46.0)
Hemoglobin: 8.5 g/dL — ABNORMAL LOW (ref 12.0–15.0)
Lymphocytes Relative: 31 %
Lymphs Abs: 0.7 10*3/uL (ref 0.7–4.0)
MCH: 32.6 pg (ref 26.0–34.0)
MCHC: 34.4 g/dL (ref 30.0–36.0)
MCV: 94.6 fL (ref 80.0–100.0)
Monocytes Absolute: 0.1 10*3/uL (ref 0.1–1.0)
Monocytes Relative: 6 %
Neutro Abs: 1.2 10*3/uL — ABNORMAL LOW (ref 1.7–7.7)
Neutrophils Relative %: 55 %
Platelets: 68 10*3/uL — ABNORMAL LOW (ref 150–400)
RBC: 2.61 MIL/uL — ABNORMAL LOW (ref 3.87–5.11)
RDW: 18.6 % — ABNORMAL HIGH (ref 11.5–15.5)
WBC: 2.1 10*3/uL — ABNORMAL LOW (ref 4.0–10.5)
nRBC: 2.4 % — ABNORMAL HIGH (ref 0.0–0.2)
nRBC: 3 /100{WBCs} — ABNORMAL HIGH

## 2023-07-01 LAB — URIC ACID: Uric Acid, Serum: <1.5 mg/dL — ABNORMAL LOW (ref 2.5–7.1)

## 2023-07-01 LAB — LACTATE DEHYDROGENASE: LDH: 374 U/L — ABNORMAL HIGH (ref 98–192)

## 2023-07-01 LAB — MAGNESIUM: Magnesium: 2.1 mg/dL (ref 1.7–2.4)

## 2023-07-01 LAB — SURGICAL PATHOLOGY

## 2023-07-01 MED ORDER — DEXTROSE IN LACTATED RINGERS 5 % IV SOLN: INTRAVENOUS | Status: AC

## 2023-07-01 MED ORDER — POTASSIUM CHLORIDE CRYS ER 20 MEQ PO TBCR
40.0000 meq | EXTENDED_RELEASE_TABLET | Freq: Once | ORAL | Status: AC
  Administered 2023-07-01: 40 meq via ORAL
  Filled 2023-07-01: qty 2

## 2023-07-01 NOTE — Progress Notes (Signed)
[facility] PROGRESS NOTE  Lisa Klein (DOB: [birth year]) ZOX:[id number] PCP: Kerri Perches, MD  Brief Narrative: 89 y.o. female with medical history significant of hypertension, hyperlipidemia, hypothyroidism, GERD, right leg DVT and pulmonary embolus on Xarelto, stage III (T1 cN2 aM0) left breast IDC s/p mastectomy, chemo, hormone Tx admitted on 06/23/23 with Sepsis due to CAP. Despite broad IV antibiotics, has continued to have pancytopenia and intermittent fevers with negative cultures. Peripheral smear revealed blast cells and hematology/oncology recommended bone marrow biopsy which has confirmed suspicion for acute leukemia. Palliative care is also consulted.   Subjective: Sitting in chair, says she's worn out but no real complaints, breathing fine. Wished her happy birthday which she smiled and thanked me for.  Objective: BP (!) 102/56   Pulse 85   Temp 98.8 F (37.1 C) (Oral)   Resp (!) 26   Ht 5\' 4"  (1.626 m)   Wt 84.1 kg   SpO2 100%   BMI 31.83 kg/m   Gen: No distress, frail, elderly Pulm: Nonlabored, no crackles at bases, no wheezes  CV: Irreg irreg, rate in 80-90's, no pitting edema GI: Soft, NT, ND, +BS  Neuro: Alert and oriented. No new focal deficits. Ext: Warm, no deformities. Skin: No bleeding wounds   Assessment & Plan: Principal Problem:   Sepsis (HCC) Active Problems:   Pulmonary embolism (HCC)   Neutropenia with fever (HCC)   Pancytopenia (HCC)   DVT AND Pulm Embolism-H/o Lt Leg  DVT and PE -----diagnosed 10/19/2022   Acquired hypothyroidism   Mixed hyperlipidemia   Essential hypertension   Infiltrating ductal carcinoma of breast (HCC)   GERD (gastroesophageal reflux disease)   Thrombocytopenia (HCC)   CAP (community acquired pneumonia)   Hypomagnesemia   Hypoalbuminemia due to protein-calorie malnutrition (HCC)   Neutropenia associated with infection (HCC)  Goals of care: Pt is 89yo (as of today) now with acute leukemia with  pancytopenia, acute respiratory failure, acute (new) HFrEF, new AFib w/RVR, AKI, echogenic focus on echo in [place], and worsening debility.  - Converted to DNR after GOC discussions over the weekend.  - D/w oncology who will discuss options with patient then family. - Palliative care to assist   SIRS/Neutropenic fevers: Unclear source, though cultures negative. Recurrent low grade fever overnight.  - Oncology consulted, initially suspecting cytopenias are related to an infection, though blood smear reveals blasts concerning for acute leukemia. Bone marrow biopsy 11/22, addendum: Later in day cytology confirmed acute leukemia. Dr. Anders Simmonds to discuss with patient and family. Palliative care, Lillia Carmel, NP also aware.   - Continue vancomycin, zosyn, completed azithromycin. MRSA PCR neg, consider deescalation if cultures remain negative and pt stabilizes. Pt not stable to deescalate. May be able to stop soon since we have an answer to fevers, will follow GOC discussions.  - Monitor blood cultures (negative from 11/17 and NG4D 11/20)  Acute respiratory failure with hypoxia:  - Negative V/Q scan 11/20, negative CXR 11/19.  - Maintain adequate oxygenation and normal work of breathing. Stable on 3L    New onset atrial fibrillation with RVR: No ongoing chest pain, no ischemic ECG changes.  - Continue anticoagulation, but recheck CBC this PM given drop in hgb.    - Continue metoprolol 25mg .   - Keep K, Mg replete   - Continue cardiac monitoring.   Chronic HFrEF w/RV impairment as well: Echo 11/22 also showed mild MR, trivial AI.   - This further worsens overall prognosis. Given her precarious clinical state at this time,  we will defer initiation of GDMT.   Echogenic focus in left atrium: By TTE. This is new from prior < 2 years ago. DDx includes thickened IAS, mass, thrombus.  - TEE will be considered based on global goals of care discussions. Patient is well aware of this finding and the possibilities it  entails including embolism.  - Lovenox 1mg /kg q12h  History of LLE  DVT and PE 10/19/2022:  - Restarted anticoagulation (lovenox instead of DOAC with improvement in thrombocytopenia.  - V/Q scan without evidence of PE. D-dimer elevated to 5.  Pancytopenia: Onc consulted as above.  - B12, folate, ferritin, iron TIBC serum copper and methylmalonic acid all appear replete. Uric acid normal at 3.2, LDH 449, hepatitis panel negative, HIV NR.    Hypothyroidism: TSH 7.820.  - Continue synthroid. with acute illness, unclear significance of mildly elevated TSH. Certainly would not increase supplementation in wake of new AFib.    GERD:  - Continue PPI   Hypomagnesemia/hypokalemia and hyponatremia: - Supp K again today - Holding hydrochlorothiazide - Poor po intake contributing, continue unrestricted diet.     Stage III(T1 cN2 aM0) left breast IDC: s/p modified radical mastectomy on 02/14/2011. s/p anastrozole April 2013 - April 2023  Weakness and deconditioning--PT eval appreciated recommends SNF rehab. Will have prognosis clarified soon to inform disposition venue as well.  AKI: Fluctuating kidney function, may not be taking adequate po.  - Give 1L IVF once again today, Cr plateaued.   Tyrone Nine, MD [facility] [email or web address] 07/01/2023, 1:34 PM

## 2023-07-01 NOTE — Progress Notes (Signed)
   07/01/23 1340  Spiritual Encounters  Type of Visit Initial  Care provided to: Patient  Referral source Chaplain team  Reason for visit Routine spiritual support  OnCall Visit No   Chaplain visited with the patient, Lisa Klein, who was alert and happy to visit with me. Reann shared that her sister and her daughter were both in earlier today and that her son also visited yesterday. Lisa Klein is thankful for the support she receives from her family. I wished her a happy birthday and she said she is enjoying her special day. She said she is thankful she can still hear see and enjoy food. The simple things are the best! I listened as Lisa Klein shared some of her journey with all it's ups and downs.   Valerie Roys [facility]  [phone]

## 2023-07-01 NOTE — Consult Note (Signed)
Consultation Note Date: 07/01/2023   Patient Name: Lisa Klein  DOB: 25-Jun-1930  MRN: [id number]  Age / Sex: 89 y.o., female  PCP: Kerri Perches, MD Referring Physician: Tyrone Nine, MD  Reason for Consultation: Establishing goals of care  HPI/Patient Profile: 89 y.o. female  with past medical history of HTN/HLD, hypothyroid, GERD, right leg DVT/PE on Xarelto, left breast cancer stage III, degenerative disc disease, degenerative joint disease, obesity admitted on 06/23/2023 with SIRS/neutropenic fever, acute respiratory failure with hypoxia.   Clinical Assessment and Goals of Care: I have reviewed medical records including EPIC notes, labs and imaging, received report from RN, assessed the patient.  Lisa Klein is resting quietly in bed.  She appears chronically ill and somewhat frail.  She greets me, making and mostly keeping eye contact.  Birthday wishes offered.  She is alert and oriented, able to make her needs known.  There is no family at bedside at this time.  Face-to-face conference with bedside nursing staff related to patient condition, needs, understanding.  We meet at the bedside to discuss diagnosis prognosis, GOC, EOL wishes, disposition and options. I introduced Palliative Medicine as specialized medical care for people living with serious illness. It focuses on providing relief from the symptoms and stress of a serious illness. The goal is to improve quality of life for both the patient and the family.  We discussed a brief life review of the patient.  Lisa Klein tells me that she had been living independently until this illness.  Managing her own household.  We then focused on their current illness.  Lisa Klein tells me that she met with the oncologist today and received noticed of her acute leukemia.  We talked about her acute illness.  Lisa Klein states that her sister  wants to care for Lisa Klein in her own home but she is also aged 59.  Lisa Klein states that she would like to go to hospice.  We talk about residential hospice and limitations.  I shared that people go to residential hospice to let nature take its course.  She tells me that this is incorrect, people go there to be cared for and she believes that she could go there for an extended amount of time.  Unfortunately, our local hospice organizations require either short-term respite care for symptom management or comfort care, 2 weeks or less anticipated.  We talk about hospice provider choice, Lisa Klein states that she would like to speak to her children about her choices but think she would choose the hospice in [place].  The natural disease trajectory and expectations at EOL were discussed.  Discussed the importance of continued conversation with family and the medical providers regarding overall plan of care and treatment options, ensuring decisions are within the context of the patient's values and GOCs.  Questions and concerns were addressed.  The family was encouraged to call with questions or concerns.  PMT will continue to support holistically.  Conference with attending, oncology, bedside nursing staff, transition of  care team related to patient condition, needs, goals of care, disposition.   HCPOA  NEXT OF KIN    SUMMARY OF RECOMMENDATIONS   At this point continue to treat the treatable but no CPR or intubation. States that she would like to "go to hospice" but does not understand that this would be to let nature take its course PMT to follow   Code Status/Advance Care Planning: DNR  Symptom Management:  Per hospitalist, no additional needs at this time.  Palliative Prophylaxis:  Bowel Regimen, Frequent Pain Assessment, and Oral Care  Additional Recommendations (Limitations, Scope, Preferences): Continue to treatment no CPR or intubation  Psycho-social/Spiritual:   Desire for further Chaplaincy support:yes Additional Recommendations: Caregiving  Support/Resources and Education on Hospice  Prognosis:  Unable to determine, based on outcomes.  6 months or less anticipated.  Discharge Planning: Anticipate home with home health/palliative versus hospice care.       Primary Diagnoses: Present on Admission:  Essential hypertension  Mixed hyperlipidemia  Acquired hypothyroidism  GERD (gastroesophageal reflux disease)  Pulmonary embolism (HCC)  Infiltrating ductal carcinoma of breast (HCC)  DVT AND Pulm Embolism-H/o Lt Leg  DVT and PE -----diagnosed 10/19/2022  Neutropenia with fever (HCC)  Pancytopenia (HCC)   I have reviewed the medical record, interviewed the patient and family, and examined the patient. The following aspects are pertinent.  Past Medical History:  Diagnosis Date   Cancer (HCC) 2012   LEFT BREAST   Clotting disorder (HCC) 05/2011   dvt, right leg   Degenerative disc disease    with nerve compression    DJD (degenerative joint disease)    Hypertension    Hypertension    Hypothyroidism    Hypothyroidism    Long term current use of anticoagulant therapy 12/07/2014   Pulmonary embolus    Obesity    Osteopenia 08/24/2014   Social History   Socioeconomic History   Marital status: Widowed    Spouse name: Not on file   Number of children: 2   Years of education: 46   Highest education level: 12th grade  Occupational History   Occupation: [employer] part time    Occupation: retired/disabled   Tobacco Use   Smoking status: Never   Smokeless tobacco: Never  Substance and Sexual Activity   Alcohol use: No   Drug use: No   Sexual activity: Not Currently    Birth control/protection: Post-menopausal  Other Topics Concern   Not on file  Social History Narrative   Reports starting to have a lot of trouble doing things for herself. Lives alone, tries to drive to places, but it is getting increasingly harder to do.    Social  Determinants of Health   Financial Resource Strain: Low Risk  (07/17/2022)   Overall Financial Resource Strain (CARDIA)    Difficulty of Paying Living Expenses: Not very hard  Food Insecurity: No Food Insecurity (06/23/2023)   Hunger Vital Sign    Worried About Running Out of Food in the Last Year: Never true    Ran Out of Food in the Last Year: Never true  Transportation Needs: No Transportation Needs (06/23/2023)   PRAPARE - Administrator, Civil Service (Medical): No    Lack of Transportation (Non-Medical): No  Physical Activity: Insufficiently Active (07/17/2022)   Exercise Vital Sign    Days of Exercise per Week: 5 days    Minutes of Exercise per Session: 10 min  Stress: No Stress Concern Present (07/17/2022)   [employer] of  Occupational Health - Occupational Stress Questionnaire    Feeling of Stress : Not at all  Social Connections: Socially Isolated (07/17/2022)   Social Connection and Isolation Panel [NHANES]    Frequency of Communication with Friends and Family: More than three times a week    Frequency of Social Gatherings with Friends and Family: Three times a week    Attends Religious Services: Never    Active Member of Clubs or Organizations: No    Attends Banker Meetings: Never    Marital Status: Widowed   Family History  Problem Relation Age of Onset   COPD Father    Lung disease Father    Hypertension Sister    Arthritis Mother    Hypertension Sister    Heart disease Son        stent   Anesthesia problems Neg Hx    Malignant hyperthermia Neg Hx    Hypotension Neg Hx    Pseudochol deficiency Neg Hx    Scheduled Meds:  acyclovir  400 mg Oral BID   allopurinol  300 mg Oral Daily   Chlorhexidine Gluconate Cloth  6 each Topical Daily   dextromethorphan-guaiFENesin  1 tablet Oral BID   enoxaparin (LOVENOX) injection  80 mg Subcutaneous Q12H   feeding supplement  1 Container Oral TID BM   levothyroxine  50 mcg Oral Daily    metoprolol tartrate  25 mg Oral BID   multivitamin with minerals  1 tablet Oral Daily   pantoprazole  40 mg Oral Daily   pravastatin  20 mg Oral q1800   Continuous Infusions:  piperacillin-tazobactam (ZOSYN)  IV 12.5 mL/hr at 07/01/23 0550   vancomycin     PRN Meds:.acetaminophen **OR** acetaminophen, albuterol, ondansetron **OR** ondansetron (ZOFRAN) IV Medications Prior to Admission:  Prior to Admission medications   Medication Sig Start Date End Date Taking? Authorizing Provider  acetaminophen (TYLENOL) 650 MG CR tablet Take 650 mg by mouth 2 (two) times daily.   Yes [provider]  Biotin w/ Vitamins C & E (HAIR/SKIN/NAILS PO) Take by mouth.   Yes [provider]  calcium-vitamin D (OSCAL WITH D) 500-200 MG-UNIT tablet Take 1 tablet by mouth 2 (two) times daily.   Yes [provider]  clotrimazole-betamethasone (LOTRISONE) cream Apply 1 Application topically 2 (two) times daily. 10/11/22  Yes Kerri Perches, MD  Cod Liver Oil 1000 MG CAPS Take 1 capsule by mouth daily.    Yes [provider]  denosumab (PROLIA) 60 MG/ML SOLN injection Inject 60 mg into the skin every 6 (six) months. Administer in upper arm, thigh, or abdomen   Yes [provider]  docusate sodium (COLACE) 100 MG capsule Take 100 mg by mouth daily as needed for constipation. For constipation   Yes [provider]  fluticasone (FLONASE) 50 MCG/ACT nasal spray Place 2 sprays into both nostrils daily. Patient taking differently: Place 2 sprays into both nostrils daily as needed for allergies. 04/23/23  Yes Kerri Perches, MD  hydrochlorothiazide (MICROZIDE) 12.5 MG capsule Take 1 capsule (12.5 mg total) by mouth every morning. 04/04/23  Yes Kerri Perches, MD  latanoprost (XALATAN) 0.005 % ophthalmic solution Place 1 drop into both eyes at bedtime. 07/20/21  Yes [provider]  levothyroxine (SYNTHROID) 50 MCG tablet Take 1 tablet (50 mcg total) by  mouth daily. 06/18/23  Yes Kerri Perches, MD  lovastatin (MEVACOR) 20 MG tablet TAKE 1 TABLET AT BEDTIME 05/08/23  Yes Kerri Perches, MD  pantoprazole (PROTONIX) 20 MG tablet TAKE 1 TABLET EVERY DAY 11/30/22  Yes Kerri Perches, MD  potassium chloride (KLOR-CON M) 10 MEQ tablet TAKE 1 TABLET TWICE DAILY 12/12/22  Yes Kerri Perches, MD  vitamin E 1000 UNIT capsule Take 1,000 Units by mouth daily.   Yes [provider]  XARELTO 20 MG TABS tablet TAKE 1 TABLET EVERY DAY WITH SUPPER 11/25/22  Yes Doreatha Massed, MD   Allergies  Allergen Reactions   Listerine [Antiseptic Mouth Rinse] Other (See Comments)    Caused sore throat   Singulair [Montelukast Sodium] Other (See Comments)    Abdominal spasm    Review of Systems  Unable to perform ROS: Age    Physical Exam Vitals and nursing note reviewed.  Constitutional:      General: She is not in acute distress.    Appearance: She is ill-appearing.  Cardiovascular:     Rate and Rhythm: Normal rate.  Pulmonary:     Effort: Pulmonary effort is normal. No respiratory distress.  Skin:    General: Skin is warm and dry.  Neurological:     Mental Status: She is alert and oriented to person, place, and time.  Psychiatric:        Mood and Affect: Mood normal.        Behavior: Behavior normal.     Vital Signs: BP (!) 109/48   Pulse 83   Temp 98.4 F (36.9 C) (Oral)   Resp (!) 28   Ht 5\' 4"  (1.626 m)   Wt 84.1 kg   SpO2 93%   BMI 31.83 kg/m  Pain Scale: 0-10 POSS *See Group Information*: S-Acceptable,Sleep, easy to arouse Pain Score: 0-No pain   SpO2: SpO2: 93 % O2 Device:SpO2: 93 % O2 Flow Rate: .O2 Flow Rate (L/min): 1 L/min  IO: Intake/output summary:  Intake/Output Summary (Last 24 hours) at 07/01/2023 0841 Last data filed at 07/01/2023 0550 Gross per 24 hour  Intake 1072.82 ml  Output --  Net 1072.82 ml    LBM: Last BM Date : 06/30/23 Baseline Weight: Weight: 80.8 kg Most recent  weight: Weight: 84.1 kg     Palliative Assessment/Data:     Time In: 1400    Time Out: 1455 Time Total: 55 minutes  Greater than 50%  of this time was spent counseling and coordinating care related to the above assessment and plan.  Signed by: Katheran Awe, NP   Please contact Palliative Medicine Team phone at [phone] for questions and concerns.  For individual provider: See Loretha Stapler

## 2023-07-01 NOTE — Consult Note (Signed)
Hematology consult progress note  Lisa Klein was seen at bedside today.  She was sitting in a chair.  She reported that she feels tired and cold but is feeling better.  We discussed that her bone marrow biopsy results came back as acute leukemia.  Discussed the diagnosis and prognosis in detail including the need for chemotherapy which might affect her quality of life significantly and poor prognosis considering this is likely secondary AML [secondary to her prior chemotherapy for breast cancer].  Patient stated that she is not interested in further treatment and is willing to preserve her quality of life and go with hospice.  I relayed this information to Dr. Jarvis Newcomer.  Patient wanted me to talk to the daughter Lisa Klein.  I tried to reach her on her cell phone, could not reach her and left a voicemail to call back.  Results: Flow cytometry, Bone marrow: DIAGNOSIS:   - Abnormal blast population identified.  See comment.   COMMENT:   - Flow cytometric analysis revealed an increased blast population  constituting 24% of total cells analyzed, consistent with acute  leukemia. The blasts are positive for CD4, CD13, CD33, CD34, CD38, CD117  and HLA-Dr. Please refer to bone marrow case [phone] for additional  details.   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  Patient is wishing to preserve her quality of life and choose comfort care measures with hospice.  -Please make arrangements for smooth hospice transition -Discussed goals of care with patient to determine what exactly the patient is willing to do and what she does not want to I.e transfusions, antibiotics, etc.   Thank you for involving [place] in this patient's care.  Please reach out with any questions or concerns.  Cindie Crumbly Hematology oncology [facility] at [facility]

## 2023-07-01 NOTE — Plan of Care (Signed)

## 2023-07-02 DIAGNOSIS — A419 Sepsis, unspecified organism: Secondary | ICD-10-CM | POA: Diagnosis not present

## 2023-07-02 DIAGNOSIS — Z515 Encounter for palliative care: Secondary | ICD-10-CM | POA: Diagnosis not present

## 2023-07-02 DIAGNOSIS — Z7189 Other specified counseling: Secondary | ICD-10-CM | POA: Diagnosis not present

## 2023-07-02 LAB — BASIC METABOLIC PANEL
Anion gap: 9 (ref 5–15)
BUN: 15 mg/dL (ref 8–23)
CO2: 23 mmol/L (ref 22–32)
Calcium: 7.4 mg/dL — ABNORMAL LOW (ref 8.9–10.3)
Chloride: 104 mmol/L (ref 98–111)
Creatinine, Ser: 1.02 mg/dL — ABNORMAL HIGH (ref 0.44–1.00)
GFR, Estimated: 52 mL/min — ABNORMAL LOW (ref >60)
Glucose, Bld: 106 mg/dL — ABNORMAL HIGH (ref 70–99)
Potassium: 3.5 mmol/L (ref 3.5–5.1)
Sodium: 136 mmol/L (ref 135–145)

## 2023-07-02 LAB — CBC
HCT: 24.7 % — ABNORMAL LOW (ref 36.0–46.0)
Hemoglobin: 8.1 g/dL — ABNORMAL LOW (ref 12.0–15.0)
MCH: 31.3 pg (ref 26.0–34.0)
MCHC: 32.8 g/dL (ref 30.0–36.0)
MCV: 95.4 fL (ref 80.0–100.0)
Platelets: 81 10*3/uL — ABNORMAL LOW (ref 150–400)
RBC: 2.59 MIL/uL — ABNORMAL LOW (ref 3.87–5.11)
RDW: 18.7 % — ABNORMAL HIGH (ref 11.5–15.5)
WBC: 1.9 10*3/uL — ABNORMAL LOW (ref 4.0–10.5)
nRBC: 2.7 % — ABNORMAL HIGH (ref 0.0–0.2)

## 2023-07-02 LAB — LACTATE DEHYDROGENASE: LDH: 338 U/L — ABNORMAL HIGH (ref 98–192)

## 2023-07-02 LAB — URIC ACID: Uric Acid, Serum: <1.5 mg/dL — ABNORMAL LOW (ref 2.5–7.1)

## 2023-07-02 LAB — SURGICAL PATHOLOGY

## 2023-07-02 MED ORDER — APIXABAN 2.5 MG PO TABS
2.5000 mg | ORAL_TABLET | Freq: Two times a day (BID) | ORAL | Status: DC
  Administered 2023-07-02: 2.5 mg via ORAL
  Filled 2023-07-02: qty 1

## 2023-07-02 MED ORDER — METOPROLOL TARTRATE 25 MG PO TABS: 25.0000 mg | ORAL_TABLET | Freq: Two times a day (BID) | ORAL | 2 refills | Status: DC

## 2023-07-02 MED ORDER — DICLOFENAC SODIUM 1 % EX GEL: 2.0000 g | Freq: Four times a day (QID) | CUTANEOUS | 1 refills | Status: DC

## 2023-07-02 MED ORDER — APIXABAN 2.5 MG PO TABS: 2.5000 mg | ORAL_TABLET | Freq: Two times a day (BID) | ORAL | 2 refills | Status: DC

## 2023-07-02 NOTE — Progress Notes (Addendum)
Palliative: Mrs. Wagers is sitting up in the Parker chair in her room.  She appears chronically ill and elderly.  She greets me, making and mostly keeping eye contact.  She is alert and oriented, able to make her needs known.  Her daughter, [name], is present at bedside.  They call her sister, Cordelia Pen for a phone conference.    Mrs. Pressler and her family tell me that she has decided against any cancer treatment or further workup.  We talk about disposition options including, but not limited to, home with home health, outpatient palliative, treat the treatable hospice, comfort care hospice.  After much discussion, Mrs. Stimmel and her family elect home with home health services and outpatient palliative services.  We talked about transitioning to "treat the treatable" hospice care when appropriate.  We talked about preferred place of death, home.  We talk about the concept of do not rehospitalize.  I share as things start to change for her if she may decide to take less medication.  She has many questions about blood thinner use which are answered before we finish our conversation.  We talk about transport home, EMS requested.  We talk about equipment needs, requesting hospital bed and wheelchair.  I encouraged patient and family to lean on home health, palliative, and in the future hospice services for any needs or questions.  We talk about disposition home.  Mrs. Muthig states that she feels she needs an ambulance transport home.  She would also like a hospital bed and wheelchair.  She would go to her sister Ethel's home at [address] in [place].  Plan:   At this point continue to treat the treatable but no CPR or intubation.  Home via ambulance services to her sister's home for home health services and palliative services.  Provider choice offered, they choose [facility]. Addendum: Mrs. Dybdahl and her family elected no further cancer workup or treatment.  She talks of her experience with  previous cancer treatments.  50 minutes  Lillia Carmel, NP Palliative medicine team Team phone [phone]

## 2023-07-02 NOTE — TOC Progression Note (Signed)
DME  Progression Note    Patient Details  Name: Lisa Klein MRN: [id number] Date of Birth: [birth year]  Transition of Care [facility]) CM/SW Contact  Leitha Bleak, RN Phone Number: 07/02/2023, 2:34 PM  Hospital Bed Patient requires frequent re-positioning of the body in ways that cannot be achieved with  an ordinary bed or wedge pillow, to eliminate pain, reduce pressure, and the head of the  bed to be elevated more than 30 degrees  most of the time due to weakness,sepsis, Acute myeloid leukemia.

## 2023-07-02 NOTE — Plan of Care (Signed)
  Problem: Education: Goal: Knowledge of General Education information will improve Description: Including pain rating scale, medication(s)/side effects and non-pharmacologic comfort measures Outcome: Progressing   Problem: Health Behavior/Discharge Planning: Goal: Ability to manage health-related needs will improve Outcome: Progressing   Problem: Clinical Measurements: Goal: Ability to maintain clinical measurements within normal limits will improve Outcome: Progressing Goal: Will remain free from infection Outcome: Progressing Goal: Diagnostic test results will improve Outcome: Progressing Goal: Respiratory complications will improve Outcome: Progressing Goal: Cardiovascular complication will be avoided Outcome: Progressing   Problem: Activity: Goal: Risk for activity intolerance will decrease Outcome: Progressing   Problem: Nutrition: Goal: Adequate nutrition will be maintained Outcome: Not Progressing   Problem: Coping: Goal: Level of anxiety will decrease Outcome: Progressing   Problem: Elimination: Goal: Will not experience complications related to bowel motility Outcome: Progressing Goal: Will not experience complications related to urinary retention Outcome: Progressing   Problem: Pain Management: Goal: General experience of comfort will improve Outcome: Not Progressing   Problem: Safety: Goal: Ability to remain free from injury will improve Outcome: Progressing   Problem: Skin Integrity: Goal: Risk for impaired skin integrity will decrease Outcome: Progressing   Problem: Activity: Goal: Ability to tolerate increased activity will improve Outcome: Progressing   Problem: Clinical Measurements: Goal: Ability to maintain a body temperature in the normal range will improve Outcome: Progressing

## 2023-07-02 NOTE — TOC Transition Note (Signed)
Transition of Care [facility]) - CM/SW Discharge Note   Patient Details  Name: Lisa Klein MRN: [id number] Date of Birth: 07-23-1930  [facility] CM/SW Contact:  Leitha Bleak, RN Phone Number: 07/02/2023, 2:53 PM   Clinical Narrative:   Patient will discharge to Aunt's house at [address] [place]. Palliative discussion completed.  Family is agreeable to home health, outpatient palliative and needs DME.  MD ordered HHPT,RN,Aide.SW. CMS choices discuss with daughter Smith Mince with [facility] accepted the referral. MD ordering a hospital bed and wheelchair. Per [name] this will need to bed delivered today, she has no bed at her aunt's house. Zach with [facility] accepted the referral and is work on ETA for delivery. RN aware to call EMS when DME has been delivered. Referral sent to [facility] for Outpatient palliative.  CM reviewed DC plan with [name].  Kandee Keen and Zach[facility] updated with correct address for delivery and services.    Final next level of care: Home w Home Health Services Barriers to Discharge: Other (must enter comment) (DME delivery)   Patient Goals and CMS Choice CMS Medicare.gov Compare Post Acute Care list provided to:: Patient Choice offered to / list presented to : Adult Children  Discharge Placement               Patient to be transferred to facility by: EMS to home Name of family member notified: [name] Patient and family notified of of transfer: 07/02/23  Discharge Plan and Services Additional resources added to the After Visit Summary for       Post Acute Care Choice: Nursing Home          DME Arranged: Hospital bed, Wheelchair manual DME Agency: [facility] Date DME Agency Contacted: 07/02/23 Time DME Agency Contacted: 1427 Representative spoke with at DME Agency: Ian Malkin HH Arranged: RN, PT, Nurse's Aide, Social Work [employer] Agency: [facility] Date [facility] Agency Contacted: 07/02/23 Time HH Agency Contacted: 1453 Representative spoke with at  [facility] Agency: Kandee Keen  Social Determinants of Health (SDOH) Interventions SDOH Screenings   Food Insecurity: No Food Insecurity (06/23/2023)  Housing: Patient Declined (06/23/2023)  Transportation Needs: No Transportation Needs (06/23/2023)  Utilities: Not At Risk (06/23/2023)  Alcohol Screen: Low Risk  (07/17/2022)  Depression (PHQ2-9): Medium Risk (04/23/2023)  Financial Resource Strain: Low Risk  (07/17/2022)  Physical Activity: Insufficiently Active (07/17/2022)  Social Connections: Socially Isolated (07/17/2022)  Stress: No Stress Concern Present (07/17/2022)  Tobacco Use: Low Risk  (07/01/2023)    Readmission Risk Interventions    06/25/2023    3:39 PM  Readmission Risk Prevention Plan  Transportation Screening Complete  PCP or Specialist Appt within 5-7 Days Not Complete  Home Care Screening Complete  Medication Review (RN CM) Complete

## 2023-07-02 NOTE — Discharge Summary (Signed)
Physician Discharge Summary  Lisa Klein KZS:[id number] DOB: Aug 30, 1929 DOA: 06/23/2023  PCP: Lisa Perches, MD  Admit date: 06/23/2023  Discharge date: 07/02/2023  Admitted From:Home  Disposition:  Home  Recommendations for Outpatient Follow-up:  Follow up with PCP in 1-2 weeks Follow-up with palliative care outpatient Remain now on Eliquis 2.5 mg twice daily as recommended per hematology, patient does not desire any further follow-up with oncology regarding acute leukemia Continue other home medications as prior Anticipate transition to home hospice  Home Health: Yes  Equipment/Devices: Multiple equipment to be delivered  Discharge Condition:Stable  CODE STATUS: DNR  Diet recommendation: Heart Healthy  Brief/Interim Summary: 89 y.o. female with medical history significant of hypertension, hyperlipidemia, hypothyroidism, GERD, right leg DVT and pulmonary embolus on Xarelto, stage III (T1 cN2 aM0) left breast IDC s/p mastectomy, chemo, hormone Tx admitted on 06/23/23 with Sepsis due to CAP. Despite broad IV antibiotics, has continued to have pancytopenia and intermittent fevers with negative cultures. Peripheral smear revealed blast cells and hematology/oncology recommended bone marrow biopsy which has confirmed suspicion for acute leukemia.  She no longer requires antibiotics and has been seen by oncology with no desire to follow-up outpatient regarding her leukemia.  She has been seen by palliative and discussion has been had with patient and family members who are agreeable to going home with home health services as well as palliative care and anticipate transition to hospice in the near future.  No other acute events or concerns noted throughout the course of the stay and she is in stable condition for discharge.  Discharge Diagnoses:  Principal Problem:   Sepsis (HCC) Active Problems:   Pulmonary embolism (HCC)   Neutropenia with fever (HCC)   Pancytopenia  (HCC)   DVT AND Pulm Embolism-H/o Lt Leg  DVT and PE -----diagnosed 10/19/2022   Acquired hypothyroidism   Mixed hyperlipidemia   Essential hypertension   Infiltrating ductal carcinoma of breast (HCC)   GERD (gastroesophageal reflux disease)   Thrombocytopenia (HCC)   CAP (community acquired pneumonia)   Hypomagnesemia   Hypoalbuminemia due to protein-calorie malnutrition (HCC)   Neutropenia associated with infection (HCC)   Acute myeloid leukemia not having achieved remission (HCC)  Principal discharge diagnosis: Neutropenic fever in the setting of newly diagnosed acute leukemia, suspect secondary AML-favoring home palliative/hospice care.  Discharge Instructions  Discharge Instructions     Diet - low sodium heart healthy   Complete by: As directed    Increase activity slowly   Complete by: As directed       Allergies as of 07/02/2023       Reactions   Listerine [antiseptic Mouth Rinse] Other (See Comments)   Caused sore throat   Singulair [montelukast Sodium] Other (See Comments)   Abdominal spasm         Medication List     STOP taking these medications    hydrochlorothiazide 12.5 MG capsule Commonly known as: MICROZIDE   Xarelto 20 MG Tabs tablet Generic drug: rivaroxaban       TAKE these medications    acetaminophen 650 MG CR tablet Commonly known as: TYLENOL Take 650 mg by mouth 2 (two) times daily.   apixaban 2.5 MG Tabs tablet Commonly known as: ELIQUIS Take 1 tablet (2.5 mg total) by mouth 2 (two) times daily.   calcium-vitamin D 500-200 MG-UNIT tablet Commonly known as: OSCAL WITH D Take 1 tablet by mouth 2 (two) times daily.   clotrimazole-betamethasone cream Commonly known as: LOTRISONE Apply 1 Application topically 2 (  two) times daily.   Cod Liver Oil 1000 MG Caps Take 1 capsule by mouth daily.   denosumab 60 MG/ML Soln injection Commonly known as: PROLIA Inject 60 mg into the skin every 6 (six) months. Administer in upper arm,  thigh, or abdomen   docusate sodium 100 MG capsule Commonly known as: COLACE Take 100 mg by mouth daily as needed for constipation. For constipation   fluticasone 50 MCG/ACT nasal spray Commonly known as: FLONASE Place 2 sprays into both nostrils daily. What changed:  when to take this reasons to take this   HAIR/SKIN/NAILS PO Take by mouth.   latanoprost 0.005 % ophthalmic solution Commonly known as: XALATAN Place 1 drop into both eyes at bedtime.   levothyroxine 50 MCG tablet Commonly known as: SYNTHROID Take 1 tablet (50 mcg total) by mouth daily.   lovastatin 20 MG tablet Commonly known as: MEVACOR TAKE 1 TABLET AT BEDTIME   metoprolol tartrate 25 MG tablet Commonly known as: LOPRESSOR Take 1 tablet (25 mg total) by mouth 2 (two) times daily.   pantoprazole 20 MG tablet Commonly known as: PROTONIX TAKE 1 TABLET EVERY DAY   potassium chloride 10 MEQ tablet Commonly known as: KLOR-CON M TAKE 1 TABLET TWICE DAILY   vitamin E 1000 UNIT capsule Take 1,000 Units by mouth daily.        Follow-up Information     Lisa Perches, MD. Schedule an appointment as soon as possible for a visit in 1 week(s).   Specialty: Family Medicine Contact information: [address], [address] [place] [address] [phone]                Allergies  Allergen Reactions   Listerine [Antiseptic Mouth Rinse] Other (See Comments)    Caused sore throat   Singulair [Montelukast Sodium] Other (See Comments)    Abdominal spasm     Consultations: Hematology/oncology Palliative care   Procedures/Studies: ECHOCARDIOGRAM COMPLETE  Result Date: 06/28/2023    ECHOCARDIOGRAM REPORT   Patient Name:   Lisa Klein Date of Exam: 06/28/2023 Medical Rec #:  [id number]        Height:       64.0 in Accession #:    [id number]       Weight:       179.2 lb Date of Birth:  04-15-1930       BSA:          1.867 m Patient Age:    89 years         BP:           134/67 mmHg  Patient Gender: F                HR:           100 bpm. Exam Location:  Inpatient Procedure: 2D Echo, Cardiac Doppler and Color Doppler Indications:    Atrial fibrillation. Respiratory distress.  History:        Patient has prior history of Echocardiogram examinations, most                 recent 07/06/2021. History of cancer; Risk Factors:Hypertension                 and Dyslipidemia.  Sonographer:    Delcie Roch RDCS Referring Phys: [id number] IMPRESSIONS  1. Left ventricular ejection fraction, by estimation, is 35 to 40%. The left ventricle has moderately decreased function. Left ventricular endocardial border not optimally defined to  evaluate regional wall motion. Left ventricular diastolic parameters were normal.  2. Right ventricular systolic function is mild to moderately reduced. The right ventricular size is normal. There is mildly elevated pulmonary artery systolic pressure. The estimated right ventricular systolic pressure is 41.6 mmHg.  3. There is an elliptical echodensity measuring 1.2 x 0.85 cm in the left atrium. Noted on the SAX view. Differential: Thrombus, thickened interatrial septum, mass etc., Consider TEE for further evaluation.  4. Right atrial size was mildly dilated.  5. The mitral valve is abnormal. Mild mitral valve regurgitation. No evidence of mitral stenosis.  6. The tricuspid valve is abnormal. Tricuspid valve regurgitation is moderate.  7. The aortic valve is tricuspid. Aortic valve regurgitation is trivial. No aortic stenosis is present.  8. The inferior vena cava is dilated in size with >50% respiratory variability, suggesting right atrial pressure of 8 mmHg. Comparison(s): Changes from prior study are noted. LVEF worsened now and new echodensity is noted in the left atrium most likely thickened interatrial septum, r/o thrombus and mass. FINDINGS  Left Ventricle: Left ventricular ejection fraction, by estimation, is 35 to 40%. The left ventricle has moderately decreased  function. Left ventricular endocardial border not optimally defined to evaluate regional wall motion. The left ventricular internal cavity size was normal in size. There is no left ventricular hypertrophy. Left ventricular diastolic parameters were normal. Right Ventricle: The right ventricular size is normal. No increase in right ventricular wall thickness. Right ventricular systolic function is mild to moderately reduced. There is mildly elevated pulmonary artery systolic pressure. The tricuspid regurgitant velocity is 2.90 m/s, and with an assumed right atrial pressure of 8 mmHg, the estimated right ventricular systolic pressure is 41.6 mmHg. Left Atrium: There is an elliptical echodensity measuring 1.2 x 0.85 cm in the left atrium. Noted on the SAX view. Differential: Thrombus, thickened interatrial septum, mass etc., Consider TEE for further evaluation. Left atrial size was normal in size. Right Atrium: Right atrial size was mildly dilated. Pericardium: There is no evidence of pericardial effusion. Mitral Valve: The mitral valve is abnormal. Mild to moderate mitral annular calcification. Mild mitral valve regurgitation. No evidence of mitral valve stenosis. Tricuspid Valve: The tricuspid valve is abnormal. Tricuspid valve regurgitation is moderate . No evidence of tricuspid stenosis. Aortic Valve: The aortic valve is tricuspid. Aortic valve regurgitation is trivial. No aortic stenosis is present. Pulmonic Valve: The pulmonic valve was normal in structure. Pulmonic valve regurgitation is mild. No evidence of pulmonic stenosis. Aorta: The aortic root is normal in size and structure. Venous: The inferior vena cava is dilated in size with greater than 50% respiratory variability, suggesting right atrial pressure of 8 mmHg. IAS/Shunts: No atrial level shunt detected by color flow Doppler.  LEFT VENTRICLE PLAX 2D LVIDd:         3.80 cm LVIDs:         3.10 cm LV PW:         1.00 cm LV IVS:        1.00 cm LVOT diam:      1.90 cm LVOT Area:     2.84 cm  RIGHT VENTRICLE            IVC RV Basal diam:  2.30 cm    IVC diam: 2.40 cm RV S prime:     8.59 cm/s TAPSE (M-mode): 1.0 cm LEFT ATRIUM             Index        RIGHT ATRIUM  Index LA diam:        4.20 cm 2.25 cm/m   RA Area:     14.30 cm LA Vol (A2C):   61.0 ml 32.67 ml/m  RA Volume:   30.90 ml  16.55 ml/m LA Vol (A4C):   57.3 ml 30.69 ml/m LA Biplane Vol: 61.8 ml 33.10 ml/m   AORTA Ao Root diam: 2.60 cm MR Peak grad: 91.4 mmHg   TRICUSPID VALVE MR Mean grad: 61.0 mmHg   TR Peak grad:   33.6 mmHg MR Vmax:      478.00 cm/s TR Vmax:        290.00 cm/s MR Vmean:     370.0 cm/s                           SHUNTS                           Systemic Diam: 1.90 cm Vishnu Priya Mallipeddi Electronically signed by Winfield Rast Mallipeddi Signature Date/Time: 06/28/2023/4:39:47 PM    Final    NM Pulmonary Perfusion  Result Date: 06/26/2023 CLINICAL DATA:  Pulmonary embolism (PE) suspected, low to intermediate prob, positive D-dimer EXAM: NUCLEAR MEDICINE PERFUSION LUNG SCAN TECHNIQUE: Perfusion images were obtained in multiple projections after intravenous injection of radiopharmaceutical. Ventilation scans intentionally deferred if perfusion scan and chest x-ray adequate for interpretation during COVID 19 epidemic. RADIOPHARMACEUTICALS:  4.4 mCi Tc-31m MAA IV COMPARISON:  Radiograph yesterday FINDINGS: Homogeneous distribution of radiotracer. No peripheral or wedge-shaped perfusion defects to suggest pulmonary embolus. IMPRESSION: No scintigraphic findings of pulmonary embolus. Electronically Signed   By: Narda Rutherford M.D.   On: 06/26/2023 16:56   DG Chest 2 View  Result Date: 06/25/2023 CLINICAL DATA:  Dyspnea. EXAM: CHEST - 2 VIEW COMPARISON:  June 23, 2023. FINDINGS: The heart size and mediastinal contours are within normal limits. No acute pulmonary abnormality seen. The visualized skeletal structures are unremarkable. IMPRESSION: No active cardiopulmonary  disease. Electronically Signed   By: Lupita Raider M.D.   On: 06/25/2023 14:06   DG Chest Port 1 View  Result Date: 06/23/2023 CLINICAL DATA:  Cough and fever. EXAM: PORTABLE CHEST 1 VIEW COMPARISON:  March 22, 2021 FINDINGS: Cardiomediastinal silhouette is normal. Mediastinal contours appear intact. There is no evidence of focal airspace consolidation, pleural effusion or pneumothorax. Osseous structures are without acute abnormality. Left chest wall postsurgical changes stable. IMPRESSION: No active disease. Electronically Signed   By: Ted Mcalpine M.D.   On: 06/23/2023 18:20     Discharge Exam: Vitals:   07/02/23 1100 07/02/23 1200  BP: 111/63 112/74  Pulse:    Resp: (!) 22 (!) 24  Temp:    SpO2:     Vitals:   07/02/23 0833 07/02/23 1035 07/02/23 1100 07/02/23 1200  BP:  (!) 146/59 111/63 112/74  Pulse:      Resp:  (!) 21 (!) 22 (!) 24  Temp: 98.5 F (36.9 C)     TempSrc: Oral     SpO2:      Weight:      Height:        General: Pt is alert, awake, not in acute distress Cardiovascular: RRR, S1/S2 +, no rubs, no gallops Respiratory: CTA bilaterally, no wheezing, no rhonchi Abdominal: Soft, NT, ND, bowel sounds + Extremities: no edema, no cyanosis    The results of significant diagnostics from this hospitalization (including imaging, microbiology, ancillary  and laboratory) are listed below for reference.     Microbiology: Recent Results (from the past 240 hour(s))  Blood culture (routine x 2)     Status: None   Collection Time: 06/23/23  6:16 PM   Specimen: BLOOD RIGHT HAND  Result Value Ref Range Status   Specimen Description BLOOD RIGHT HAND  Final   Special Requests   Final    BOTTLES DRAWN AEROBIC AND ANAEROBIC Blood Culture adequate volume   Culture   Final    NO GROWTH 5 DAYS Performed at [facility], [address], [place], [place] [address]    Report Status 06/28/2023 FINAL  Final  Blood culture (routine x 2)     Status: None   Collection  Time: 06/23/23  6:25 PM   Specimen: BLOOD RIGHT FOREARM  Result Value Ref Range Status   Specimen Description BLOOD RIGHT FOREARM  Final   Special Requests   Final    BOTTLES DRAWN AEROBIC AND ANAEROBIC Blood Culture adequate volume   Culture   Final    NO GROWTH 5 DAYS Performed at [facility], [address], [place], [place] [address]    Report Status 06/28/2023 FINAL  Final  SARS Coronavirus 2 by RT PCR (hospital order, performed in [facility] hospital lab) *cepheid single result test* Anterior Nasal Swab     Status: None   Collection Time: 06/23/23  8:25 PM   Specimen: Anterior Nasal Swab  Result Value Ref Range Status   SARS Coronavirus 2 by RT PCR NEGATIVE NEGATIVE Final    Comment: (NOTE) SARS-CoV-2 target nucleic acids are NOT DETECTED.  The SARS-CoV-2 RNA is generally detectable in upper and lower respiratory specimens during the acute phase of infection. The lowest concentration of SARS-CoV-2 viral copies this assay can detect is 250 copies / mL. A negative result does not preclude SARS-CoV-2 infection and should not be used as the sole basis for treatment or other patient management decisions.  A negative result may occur with improper specimen collection / handling, submission of specimen other than nasopharyngeal swab, presence of viral mutation(s) within the areas targeted by this assay, and inadequate number of viral copies (<250 copies / mL). A negative result must be combined with clinical observations, patient history, and epidemiological information.  Fact Sheet for Patients:   RoadLapTop.co.za  Fact Sheet for Healthcare Providers: http://kim-miller.com/  This test is not yet approved or  cleared by the [place] FDA and has been authorized for detection and/or diagnosis of SARS-CoV-2 by FDA under an Emergency Use Authorization (EUA).  This EUA will remain in effect (meaning this test can be used) for the  duration of the COVID-19 declaration under Section 564(b)(1) of the Act, 21 U.S.C. section 360bbb-3(b)(1), unless the authorization is terminated or revoked sooner.  Performed at [facility], [address], [place], [place] [address]   Culture, blood (Routine X 2) w Reflex to ID Panel     Status: None   Collection Time: 06/25/23  7:53 PM   Specimen: BLOOD  Result Value Ref Range Status   Specimen Description BLOOD RIGHT ANTECUBITAL  Final   Special Requests   Final    BOTTLES DRAWN AEROBIC AND ANAEROBIC Blood Culture results may not be optimal due to an excessive volume of blood received in culture bottles   Culture   Final    NO GROWTH 5 DAYS Performed at [facility], [address], [place], [place] [address]    Report Status 06/30/2023 FINAL  Final  Culture, blood (Routine X 2) w Reflex to ID Panel     Status: None   Collection Time: 06/25/23  7:53 PM   Specimen: BLOOD  Result Value Ref Range Status   Specimen Description BLOOD AEROBIC BOTTLE ONLY BLOOD RIGHT FOREARM  Final   Special Requests   Final    BOTTLES DRAWN AEROBIC ONLY Blood Culture results may not be optimal due to an inadequate volume of blood received in culture bottles   Culture   Final    NO GROWTH 5 DAYS Performed at [facility], [address], [place], [place] [address]    Report Status 06/30/2023 FINAL  Final  MRSA Next Gen by PCR, Nasal     Status: None   Collection Time: 06/26/23  1:38 AM   Specimen: Nasal Mucosa; Nasal Swab  Result Value Ref Range Status   MRSA by PCR Next Gen NOT DETECTED NOT DETECTED Final    Comment: (NOTE) The GeneXpert MRSA Assay (FDA approved for NASAL specimens only), is one component of a comprehensive MRSA colonization surveillance program. It is not intended to diagnose MRSA infection nor to guide or monitor treatment for MRSA infections. Test performance is not FDA approved in patients less than 30 years old. Performed at [facility], [address] [place], [place]  [address]      Labs: BNP (last 3 results) No results for input(s): "BNP" in the last 8760 hours. Basic Metabolic Panel: Recent Labs  Lab 06/26/23 0453 06/27/23 0457 06/28/23 0514 06/29/23 0432 06/30/23 0421 07/01/23 0535 07/02/23 0400  NA 132*   < > 133* 137 138 135 136  K 3.7   < > 3.9 3.7 3.7 3.3* 3.5  CL 99   < > 101 104 105 102 104  CO2 21*   < > 23 23 22 24 23   GLUCOSE 120*   < > 128* 122* 98 109* 106*  BUN 16   < > 20 22 23 18 15   CREATININE 0.96   < > 1.06* 1.12* 1.15* 1.11* 1.02*  CALCIUM 7.7*   < > 7.5* 7.5* 7.6* 7.5* 7.4*  MG  --   --   --   --   --  2.1  --   PHOS 2.7  --   --   --   --   --   --    < > = values in this interval not displayed.   Liver Function Tests: Recent Labs  Lab 06/26/23 0453  ALBUMIN 2.4*   No results for input(s): "LIPASE", "AMYLASE" in the last 168 hours. No results for input(s): "AMMONIA" in the last 168 hours. CBC: Recent Labs  Lab 06/27/23 0457 06/28/23 0514 06/29/23 0432 06/30/23 0421 07/01/23 0535 07/01/23 1423 07/02/23 0400  WBC 1.7* 2.0* 2.1* 2.3* 2.1*  --  1.9*  NEUTROABS 0.7* 0.8* 1.1* 1.0* 1.2*  --   --   HGB 9.9* 9.6* 9.2* 9.8* 8.5* 10.0* 8.1*  HCT 30.6* 29.6* 26.8* 30.5* 24.7* 31.6* 24.7*  MCV 93.9 94.0 93.7 95.3 94.6  --  95.4  PLT 47* 48* 57* 56* 68*  --  81*   Cardiac Enzymes: No results for input(s): "CKTOTAL", "CKMB", "CKMBINDEX", "TROPONINI" in the last 168 hours. BNP: Invalid input(s): "POCBNP" CBG: No results for input(s): "GLUCAP" in the last 168 hours. D-Dimer No results for input(s): "DDIMER" in the last 72 hours. Hgb A1c No results for input(s): "HGBA1C" in the last 72 hours. Lipid Profile No results for input(s): "CHOL", "HDL", "LDLCALC", "TRIG", "  CHOLHDL", "LDLDIRECT" in the last 72 hours. Thyroid function studies No results for input(s): "TSH", "T4TOTAL", "T3FREE", "THYROIDAB" in the last 72 hours.  Invalid input(s): "FREET3" Anemia work up No results for input(s): "VITAMINB12", "FOLATE",  "FERRITIN", "TIBC", "IRON", "RETICCTPCT" in the last 72 hours. Urinalysis    Component Value Date/Time   COLORURINE YELLOW 06/24/2023 0036   APPEARANCEUR CLEAR 06/24/2023 0036   LABSPEC 1.015 06/24/2023 0036   PHURINE 6.0 06/24/2023 0036   GLUCOSEU NEGATIVE 06/24/2023 0036   HGBUR MODERATE (A) 06/24/2023 0036   BILIRUBINUR NEGATIVE 06/24/2023 0036   BILIRUBINUR negative 07/17/2012 1058   KETONESUR 5 (A) 06/24/2023 0036   PROTEINUR 30 (A) 06/24/2023 0036   UROBILINOGEN 1.0 01/19/2013 0105   NITRITE NEGATIVE 06/24/2023 0036   LEUKOCYTESUR NEGATIVE 06/24/2023 0036   Sepsis Labs Recent Labs  Lab 06/29/23 0432 06/30/23 0421 07/01/23 0535 07/02/23 0400  WBC 2.1* 2.3* 2.1* 1.9*   Microbiology Recent Results (from the past 240 hour(s))  Blood culture (routine x 2)     Status: None   Collection Time: 06/23/23  6:16 PM   Specimen: BLOOD RIGHT HAND  Result Value Ref Range Status   Specimen Description BLOOD RIGHT HAND  Final   Special Requests   Final    BOTTLES DRAWN AEROBIC AND ANAEROBIC Blood Culture adequate volume   Culture   Final    NO GROWTH 5 DAYS Performed at [facility], [address] [place], [place] [address]    Report Status 06/28/2023 FINAL  Final  Blood culture (routine x 2)     Status: None   Collection Time: 06/23/23  6:25 PM   Specimen: BLOOD RIGHT FOREARM  Result Value Ref Range Status   Specimen Description BLOOD RIGHT FOREARM  Final   Special Requests   Final    BOTTLES DRAWN AEROBIC AND ANAEROBIC Blood Culture adequate volume   Culture   Final    NO GROWTH 5 DAYS Performed at [facility], [address] [place], [place] [address]    Report Status 06/28/2023 FINAL  Final  SARS Coronavirus 2 by RT PCR (hospital order, performed in [facility] hospital lab) *cepheid single result test* Anterior Nasal Swab     Status: None   Collection Time: 06/23/23  8:25 PM   Specimen: Anterior Nasal Swab  Result Value Ref Range Status   SARS Coronavirus 2 by RT  PCR NEGATIVE NEGATIVE Final    Comment: (NOTE) SARS-CoV-2 target nucleic acids are NOT DETECTED.  The SARS-CoV-2 RNA is generally detectable in upper and lower respiratory specimens during the acute phase of infection. The lowest concentration of SARS-CoV-2 viral copies this assay can detect is 250 copies / mL. A negative result does not preclude SARS-CoV-2 infection and should not be used as the sole basis for treatment or other patient management decisions.  A negative result may occur with improper specimen collection / handling, submission of specimen other than nasopharyngeal swab, presence of viral mutation(s) within the areas targeted by this assay, and inadequate number of viral copies (<250 copies / mL). A negative result must be combined with clinical observations, patient history, and epidemiological information.  Fact Sheet for Patients:   RoadLapTop.co.za  Fact Sheet for Healthcare Providers: http://kim-miller.com/  This test is not yet approved or  cleared by the [place] FDA and has been authorized for detection and/or diagnosis of SARS-CoV-2 by FDA under an Emergency Use Authorization (EUA).  This EUA will remain in effect (meaning this test can be used) for the  duration of the COVID-19 declaration under Section 564(b)(1) of the Act, 21 U.S.C. section 360bbb-3(b)(1), unless the authorization is terminated or revoked sooner.  Performed at [facility], [address], [place], [place] [address]   Culture, blood (Routine X 2) w Reflex to ID Panel     Status: None   Collection Time: 06/25/23  7:53 PM   Specimen: BLOOD  Result Value Ref Range Status   Specimen Description BLOOD RIGHT ANTECUBITAL  Final   Special Requests   Final    BOTTLES DRAWN AEROBIC AND ANAEROBIC Blood Culture results may not be optimal due to an excessive volume of blood received in culture bottles   Culture   Final    NO GROWTH 5 DAYS Performed  at [facility], [address] [place], [place] [address]    Report Status 06/30/2023 FINAL  Final  Culture, blood (Routine X 2) w Reflex to ID Panel     Status: None   Collection Time: 06/25/23  7:53 PM   Specimen: BLOOD  Result Value Ref Range Status   Specimen Description BLOOD AEROBIC BOTTLE ONLY BLOOD RIGHT FOREARM  Final   Special Requests   Final    BOTTLES DRAWN AEROBIC ONLY Blood Culture results may not be optimal due to an inadequate volume of blood received in culture bottles   Culture   Final    NO GROWTH 5 DAYS Performed at [facility], [address] [place], [place] [address]    Report Status 06/30/2023 FINAL  Final  MRSA Next Gen by PCR, Nasal     Status: None   Collection Time: 06/26/23  1:38 AM   Specimen: Nasal Mucosa; Nasal Swab  Result Value Ref Range Status   MRSA by PCR Next Gen NOT DETECTED NOT DETECTED Final    Comment: (NOTE) The GeneXpert MRSA Assay (FDA approved for NASAL specimens only), is one component of a comprehensive MRSA colonization surveillance program. It is not intended to diagnose MRSA infection nor to guide or monitor treatment for MRSA infections. Test performance is not FDA approved in patients less than 66 years old. Performed at [facility], [address] [place], [place] [address]      Time coordinating discharge: 35 minutes  SIGNED:   Erick Blinks, DO [facility] 07/02/2023, 1:41 PM  If 7PM-7AM, please contact night-coverage [email or web address]

## 2023-07-02 NOTE — Progress Notes (Signed)
Physical Therapy Treatment Patient Details Name: Lisa Klein MRN: [id number] DOB: 12-14-1929 Today's Date: 07/02/2023   History of Present Illness Lisa Klein is a 89 y.o. female with medical history significant of hypertension, hyperlipidemia, hypothyroidism, GERD, right leg DVT and pulmonary embolus on Xarelto, stage III(T1 cN2 aM0) left breast IDC follows with Mauro Kaufmann, NP.  She presents to the ED from an urgent care.  Patient complained of 2-day onset of fever, chills, body aches which started after getting COVID shot a few days ago at Dr. Anthony Sar office.  She endorsed vomiting last night.  Patient went to an urgent care today and was told that she had a pneumonia on her chest x-ray, she was advised to go to the ED for further evaluation and management.  Patient lives alone and was able to take care of her ADLs.  She ambulates with a cane and walker.    PT Comments  Patient present seated in chair (assisted by nursing staff) and agreeable for therapy.  Patient demonstrates fair/good return for completing BLE ROM/strengthening exercises, but requires frequent rest breaks due to fatigue, able to take a few steps during transfer to [facility], but limited to bedside due to c/o fatigue and BLE weakness.  Patient tolerated staying up in chair after therapy with her daughter present.  Patient will benefit from continued skilled physical therapy in hospital and recommended venue below to increase strength, balance, endurance for safe ADLs and gait.       If plan is discharge home, recommend the following: A lot of help with bathing/dressing/bathroom;A lot of help with walking and/or transfers;Help with stairs or ramp for entrance;Assistance with cooking/housework   Can travel by private vehicle     Yes  Equipment Recommendations  None recommended by PT    Recommendations for Other Services       Precautions / Restrictions Precautions Precautions: Fall Restrictions Weight  Bearing Restrictions: No     Mobility  Bed Mobility               General bed mobility comments: Presents seated in chair (assisted by nursing staff)    Transfers Overall transfer level: Needs assistance Equipment used: Rolling walker (2 wheels) Transfers: Sit to/from Stand, Bed to chair/wheelchair/BSC Sit to Stand: Mod assist   Step pivot transfers: Mod assist       General transfer comment: slow labored movement for transferring to/from [facility]    Ambulation/Gait Ambulation/Gait assistance: Mod assist   Assistive device: Rolling walker (2 wheels) Gait Pattern/deviations: Decreased step length - right, Decreased step length - left, Decreased stride length, Knees buckling, Trunk flexed Gait velocity: slow     General Gait Details: able to complet a few steps during chair/BSC transfers   Stairs             Wheelchair Mobility     Tilt Bed    Modified Rankin (Stroke Patients Only)       Balance Overall balance assessment: Needs assistance Sitting-balance support: Feet supported, No upper extremity supported Sitting balance-Leahy Scale: Fair Sitting balance - Comments: fair/good seated in chair   Standing balance support: Reliant on assistive device for balance, During functional activity, Bilateral upper extremity supported Standing balance-Leahy Scale: Poor Standing balance comment: fair/poor using RW                            Cognition Arousal: Alert Behavior During Therapy: WFL for tasks assessed/performed Overall Cognitive Status:  Within Functional Limits for tasks assessed                                          Exercises General Exercises - Lower Extremity Long Arc Quad: Seated, AROM, Strengthening, Both, 10 reps Hip Flexion/Marching: Seated, AROM, Strengthening, Both, 10 reps Toe Raises: Seated, AROM, Strengthening, Both, 10 reps Heel Raises: Seated, AROM, Strengthening, Both, 10 reps    General Comments         Pertinent Vitals/Pain Pain Assessment Pain Assessment: No/denies pain    Home Living                          Prior Function            PT Goals (current goals can now be found in the care plan section) Acute Rehab PT Goals Patient Stated Goal: return home after rehab PT Goal Formulation: With patient Time For Goal Achievement: 07/09/23 Potential to Achieve Goals: Good Progress towards PT goals: Progressing toward goals    Frequency    Min 3X/week      PT Plan      Co-evaluation              AM-PAC PT "6 Clicks" Mobility   Outcome Measure  Help needed turning from your back to your side while in a flat bed without using bedrails?: A Little Help needed moving from lying on your back to sitting on the side of a flat bed without using bedrails?: A Little Help needed moving to and from a bed to a chair (including a wheelchair)?: A Little Help needed standing up from a chair using your arms (e.g., wheelchair or bedside chair)?: A Little Help needed to walk in hospital room?: A Little Help needed climbing 3-5 steps with a railing? : Total 6 Click Score: 16    End of Session   Activity Tolerance: Patient tolerated treatment well;Patient limited by fatigue Patient left: in chair;with call bell/phone within reach;with chair alarm set;with family/visitor present Nurse Communication: Mobility status PT Visit Diagnosis: Unsteadiness on feet (R26.81);Other abnormalities of gait and mobility (R26.89);Muscle weakness (generalized) (M62.81)     Time: 9147-8295 PT Time Calculation (min) (ACUTE ONLY): 29 min  Charges:    $Therapeutic Exercise: 8-22 mins $Therapeutic Activity: 8-22 mins PT General Charges $$ ACUTE PT VISIT: 1 Visit                     2:01 PM, 07/02/23 Ocie Bob, MPT Physical Therapist with [facility] [phone] office [phone] mobile phone

## 2023-07-02 NOTE — Consult Note (Signed)
[facility] Liaison Consult Note    07/02/2023  HARJIT BURKART November 04, 1929 [id number]  [facility] Liaison coverage for Elliot Cousin, RN  Patient reviewed remotely for patient at [facility]  Insurance: [facility]  Primary Care Provider: Kerri Perches, MD with [facility]   Chart reviewed  for 8 day LLOS, as reviewed from Inpatient [facility] patient is currently transitioning to Hospice Care for Home with Hospice per Palliative Consult progress notes.  Plan: Patient will have full care coordination services through Hospice and needs will be met at the hospice level of care. No planned follow up for transitional needs. Will sign off at transition from hospital.  For questions,   .  Charlesetta Shanks, RN, BSN, CCM [employer], [facility] Liaison Direct Dial: [phone] or secure chat Email: [email or web address]

## 2023-07-03 NOTE — Progress Notes (Signed)
DME- wheelchair needed   Patient suffers from Weakness, Pneumonia,Acute myeloid leukemia, DVT left leg which impairs their ability to perform daily activities like ambulating in  the home. A walker will not resolve issue with performing activities of daily living. A wheelchair  will allow patient to safely perform daily activities. Patient can safely propel the wheelchair in the  home or has a caregiver who can provide assistance.

## 2023-07-06 DIAGNOSIS — C9200 Acute myeloblastic leukemia, not having achieved remission: Secondary | ICD-10-CM | POA: Diagnosis not present

## 2023-07-06 DIAGNOSIS — A419 Sepsis, unspecified organism: Secondary | ICD-10-CM | POA: Diagnosis not present

## 2023-07-06 DIAGNOSIS — E46 Unspecified protein-calorie malnutrition: Secondary | ICD-10-CM | POA: Diagnosis not present

## 2023-07-06 DIAGNOSIS — D61818 Other pancytopenia: Secondary | ICD-10-CM | POA: Diagnosis not present

## 2023-07-06 DIAGNOSIS — I10 Essential (primary) hypertension: Secondary | ICD-10-CM | POA: Diagnosis not present

## 2023-07-06 DIAGNOSIS — J189 Pneumonia, unspecified organism: Secondary | ICD-10-CM | POA: Diagnosis not present

## 2023-07-06 DIAGNOSIS — C50111 Malignant neoplasm of central portion of right female breast: Secondary | ICD-10-CM | POA: Diagnosis not present

## 2023-07-06 DIAGNOSIS — I2699 Other pulmonary embolism without acute cor pulmonale: Secondary | ICD-10-CM | POA: Diagnosis not present

## 2023-07-06 DIAGNOSIS — D701 Agranulocytosis secondary to cancer chemotherapy: Secondary | ICD-10-CM | POA: Diagnosis not present

## 2023-07-08 ENCOUNTER — Encounter (HOSPITAL_COMMUNITY): Payer: Self-pay | Admitting: Oncology

## 2023-07-08 ENCOUNTER — Telehealth: Payer: Self-pay | Admitting: Family Medicine

## 2023-07-08 NOTE — Telephone Encounter (Signed)
Patient called 11.29.2024 at 8:33 am after call hours left a message needs oxygen order was in the hospital and discharged 11.26.2024, next hospital follow up with [facility] scheduled 12.06.2024.

## 2023-07-09 ENCOUNTER — Telehealth: Payer: Self-pay | Admitting: *Deleted

## 2023-07-09 DIAGNOSIS — C9200 Acute myeloblastic leukemia, not having achieved remission: Secondary | ICD-10-CM | POA: Diagnosis not present

## 2023-07-09 DIAGNOSIS — A419 Sepsis, unspecified organism: Secondary | ICD-10-CM | POA: Diagnosis not present

## 2023-07-09 DIAGNOSIS — C50111 Malignant neoplasm of central portion of right female breast: Secondary | ICD-10-CM | POA: Diagnosis not present

## 2023-07-09 DIAGNOSIS — D701 Agranulocytosis secondary to cancer chemotherapy: Secondary | ICD-10-CM | POA: Diagnosis not present

## 2023-07-09 DIAGNOSIS — J189 Pneumonia, unspecified organism: Secondary | ICD-10-CM | POA: Diagnosis not present

## 2023-07-09 DIAGNOSIS — E46 Unspecified protein-calorie malnutrition: Secondary | ICD-10-CM | POA: Diagnosis not present

## 2023-07-09 DIAGNOSIS — I10 Essential (primary) hypertension: Secondary | ICD-10-CM | POA: Diagnosis not present

## 2023-07-09 DIAGNOSIS — D61818 Other pancytopenia: Secondary | ICD-10-CM | POA: Diagnosis not present

## 2023-07-09 DIAGNOSIS — I2699 Other pulmonary embolism without acute cor pulmonale: Secondary | ICD-10-CM | POA: Diagnosis not present

## 2023-07-09 NOTE — Telephone Encounter (Signed)
Received prior authorization for Xarelto on CoverMyMeds site.  Attempted submission however this was disconitinued 07/02/2023 upon hospital discharge.  Currently on Eliquis.  Cancelled / deleted request.  Confirmed with Bethann Goo daughter [name] they did not request Xarelto refill.

## 2023-07-10 ENCOUNTER — Telehealth: Payer: Self-pay

## 2023-07-10 DIAGNOSIS — A419 Sepsis, unspecified organism: Secondary | ICD-10-CM | POA: Diagnosis not present

## 2023-07-10 DIAGNOSIS — E46 Unspecified protein-calorie malnutrition: Secondary | ICD-10-CM | POA: Diagnosis not present

## 2023-07-10 DIAGNOSIS — D61818 Other pancytopenia: Secondary | ICD-10-CM | POA: Diagnosis not present

## 2023-07-10 DIAGNOSIS — C50111 Malignant neoplasm of central portion of right female breast: Secondary | ICD-10-CM | POA: Diagnosis not present

## 2023-07-10 DIAGNOSIS — C9200 Acute myeloblastic leukemia, not having achieved remission: Secondary | ICD-10-CM | POA: Diagnosis not present

## 2023-07-10 DIAGNOSIS — D701 Agranulocytosis secondary to cancer chemotherapy: Secondary | ICD-10-CM | POA: Diagnosis not present

## 2023-07-10 DIAGNOSIS — I10 Essential (primary) hypertension: Secondary | ICD-10-CM | POA: Diagnosis not present

## 2023-07-10 DIAGNOSIS — J189 Pneumonia, unspecified organism: Secondary | ICD-10-CM | POA: Diagnosis not present

## 2023-07-10 DIAGNOSIS — I2699 Other pulmonary embolism without acute cor pulmonale: Secondary | ICD-10-CM | POA: Diagnosis not present

## 2023-07-10 NOTE — Telephone Encounter (Signed)
Moved to 2:20 Friday with you lvm letting daughter know the change in appt time

## 2023-07-10 NOTE — Telephone Encounter (Signed)
Patient daughter requesting wheelchair, ok to order ?

## 2023-07-12 ENCOUNTER — Inpatient Hospital Stay: Payer: Self-pay | Admitting: Family Medicine

## 2023-07-12 ENCOUNTER — Encounter: Payer: Self-pay | Admitting: Family Medicine

## 2023-07-12 ENCOUNTER — Ambulatory Visit (INDEPENDENT_AMBULATORY_CARE_PROVIDER_SITE_OTHER): Payer: Medicare HMO | Admitting: Family Medicine

## 2023-07-12 VITALS — BP 110/68 | HR 76 | Resp 16

## 2023-07-12 DIAGNOSIS — F5104 Psychophysiologic insomnia: Secondary | ICD-10-CM | POA: Diagnosis not present

## 2023-07-12 DIAGNOSIS — M159 Polyosteoarthritis, unspecified: Secondary | ICD-10-CM

## 2023-07-12 DIAGNOSIS — R5383 Other fatigue: Secondary | ICD-10-CM | POA: Diagnosis not present

## 2023-07-12 DIAGNOSIS — J189 Pneumonia, unspecified organism: Secondary | ICD-10-CM | POA: Diagnosis not present

## 2023-07-12 DIAGNOSIS — Z09 Encounter for follow-up examination after completed treatment for conditions other than malignant neoplasm: Secondary | ICD-10-CM | POA: Diagnosis not present

## 2023-07-12 DIAGNOSIS — R6889 Other general symptoms and signs: Secondary | ICD-10-CM | POA: Diagnosis not present

## 2023-07-12 MED ORDER — MIRTAZAPINE 7.5 MG PO TABS: 7.5000 mg | ORAL_TABLET | Freq: Every day | ORAL | 2 refills | Status: DC

## 2023-07-12 NOTE — Patient Instructions (Signed)
F/U in 8 to 10 weeks, call if you need me sooner  New to help with appetite and sleep is remeron  Try to eat small amounts between your meals , become a snacker, so that your energy improves   Nurse please order supplemental oxygen at 2L/min also wheelchair   Elevate legs as often as possible to reduce swelling  Please reach out for any medical concerns through My chart or nursing between visits  Thanks for choosing [facility], we consider it a privelige to serve you.

## 2023-07-15 ENCOUNTER — Inpatient Hospital Stay (HOSPITAL_COMMUNITY)
Admission: EM | Admit: 2023-07-15 | Discharge: 2023-07-19 | DRG: 834 | Disposition: A | Discharge status: Hospice | Payer: Medicare HMO | Attending: Internal Medicine | Admitting: Internal Medicine

## 2023-07-15 ENCOUNTER — Emergency Department (HOSPITAL_COMMUNITY): Payer: Medicare HMO

## 2023-07-15 ENCOUNTER — Encounter (HOSPITAL_COMMUNITY): Payer: Self-pay

## 2023-07-15 ENCOUNTER — Inpatient Hospital Stay (HOSPITAL_COMMUNITY): Payer: Medicare HMO

## 2023-07-15 ENCOUNTER — Ambulatory Visit: Payer: Self-pay | Admitting: Family Medicine

## 2023-07-15 ENCOUNTER — Other Ambulatory Visit: Payer: Self-pay

## 2023-07-15 DIAGNOSIS — Z8249 Family history of ischemic heart disease and other diseases of the circulatory system: Secondary | ICD-10-CM | POA: Diagnosis not present

## 2023-07-15 DIAGNOSIS — Z9012 Acquired absence of left breast and nipple: Secondary | ICD-10-CM

## 2023-07-15 DIAGNOSIS — Z515 Encounter for palliative care: Secondary | ICD-10-CM

## 2023-07-15 DIAGNOSIS — Z86711 Personal history of pulmonary embolism: Secondary | ICD-10-CM | POA: Diagnosis not present

## 2023-07-15 DIAGNOSIS — D61818 Other pancytopenia: Secondary | ICD-10-CM | POA: Diagnosis present

## 2023-07-15 DIAGNOSIS — R0902 Hypoxemia: Secondary | ICD-10-CM | POA: Diagnosis not present

## 2023-07-15 DIAGNOSIS — E860 Dehydration: Secondary | ICD-10-CM | POA: Diagnosis present

## 2023-07-15 DIAGNOSIS — J189 Pneumonia, unspecified organism: Secondary | ICD-10-CM | POA: Diagnosis not present

## 2023-07-15 DIAGNOSIS — C9200 Acute myeloblastic leukemia, not having achieved remission: Principal | ICD-10-CM | POA: Diagnosis present

## 2023-07-15 DIAGNOSIS — Z7189 Other specified counseling: Secondary | ICD-10-CM | POA: Diagnosis not present

## 2023-07-15 DIAGNOSIS — R5081 Fever presenting with conditions classified elsewhere: Secondary | ICD-10-CM | POA: Diagnosis present

## 2023-07-15 DIAGNOSIS — R54 Age-related physical debility: Secondary | ICD-10-CM | POA: Diagnosis present

## 2023-07-15 DIAGNOSIS — Z825 Family history of asthma and other chronic lower respiratory diseases: Secondary | ICD-10-CM | POA: Diagnosis not present

## 2023-07-15 DIAGNOSIS — Z8701 Personal history of pneumonia (recurrent): Secondary | ICD-10-CM

## 2023-07-15 DIAGNOSIS — J9601 Acute respiratory failure with hypoxia: Secondary | ICD-10-CM | POA: Diagnosis not present

## 2023-07-15 DIAGNOSIS — D709 Neutropenia, unspecified: Secondary | ICD-10-CM | POA: Diagnosis present

## 2023-07-15 DIAGNOSIS — Z86718 Personal history of other venous thrombosis and embolism: Secondary | ICD-10-CM | POA: Diagnosis not present

## 2023-07-15 DIAGNOSIS — R918 Other nonspecific abnormal finding of lung field: Secondary | ICD-10-CM | POA: Diagnosis not present

## 2023-07-15 DIAGNOSIS — E46 Unspecified protein-calorie malnutrition: Secondary | ICD-10-CM | POA: Diagnosis not present

## 2023-07-15 DIAGNOSIS — A419 Sepsis, unspecified organism: Secondary | ICD-10-CM | POA: Diagnosis not present

## 2023-07-15 DIAGNOSIS — Z90710 Acquired absence of both cervix and uterus: Secondary | ICD-10-CM

## 2023-07-15 DIAGNOSIS — Z9049 Acquired absence of other specified parts of digestive tract: Secondary | ICD-10-CM | POA: Diagnosis not present

## 2023-07-15 DIAGNOSIS — I4891 Unspecified atrial fibrillation: Secondary | ICD-10-CM | POA: Diagnosis not present

## 2023-07-15 DIAGNOSIS — Z66 Do not resuscitate: Secondary | ICD-10-CM | POA: Diagnosis not present

## 2023-07-15 DIAGNOSIS — R6510 Systemic inflammatory response syndrome (SIRS) of non-infectious origin without acute organ dysfunction: Secondary | ICD-10-CM | POA: Diagnosis not present

## 2023-07-15 DIAGNOSIS — R531 Weakness: Secondary | ICD-10-CM | POA: Diagnosis not present

## 2023-07-15 DIAGNOSIS — E039 Hypothyroidism, unspecified: Secondary | ICD-10-CM | POA: Diagnosis present

## 2023-07-15 DIAGNOSIS — Z7401 Bed confinement status: Secondary | ICD-10-CM | POA: Diagnosis not present

## 2023-07-15 DIAGNOSIS — Z9221 Personal history of antineoplastic chemotherapy: Secondary | ICD-10-CM

## 2023-07-15 DIAGNOSIS — R0689 Other abnormalities of breathing: Secondary | ICD-10-CM | POA: Diagnosis not present

## 2023-07-15 DIAGNOSIS — M8580 Other specified disorders of bone density and structure, unspecified site: Secondary | ICD-10-CM | POA: Diagnosis present

## 2023-07-15 DIAGNOSIS — Z853 Personal history of malignant neoplasm of breast: Secondary | ICD-10-CM

## 2023-07-15 DIAGNOSIS — Z79890 Hormone replacement therapy (postmenopausal): Secondary | ICD-10-CM

## 2023-07-15 DIAGNOSIS — J168 Pneumonia due to other specified infectious organisms: Secondary | ICD-10-CM | POA: Diagnosis not present

## 2023-07-15 DIAGNOSIS — I482 Chronic atrial fibrillation, unspecified: Secondary | ICD-10-CM | POA: Diagnosis not present

## 2023-07-15 DIAGNOSIS — Z888 Allergy status to other drugs, medicaments and biological substances status: Secondary | ICD-10-CM

## 2023-07-15 DIAGNOSIS — G9341 Metabolic encephalopathy: Secondary | ICD-10-CM | POA: Diagnosis not present

## 2023-07-15 DIAGNOSIS — R9389 Abnormal findings on diagnostic imaging of other specified body structures: Secondary | ICD-10-CM | POA: Diagnosis not present

## 2023-07-15 DIAGNOSIS — D701 Agranulocytosis secondary to cancer chemotherapy: Secondary | ICD-10-CM | POA: Diagnosis not present

## 2023-07-15 DIAGNOSIS — I10 Essential (primary) hypertension: Secondary | ICD-10-CM | POA: Diagnosis not present

## 2023-07-15 DIAGNOSIS — I2699 Other pulmonary embolism without acute cor pulmonale: Secondary | ICD-10-CM | POA: Diagnosis not present

## 2023-07-15 DIAGNOSIS — I82409 Acute embolism and thrombosis of unspecified deep veins of unspecified lower extremity: Secondary | ICD-10-CM | POA: Diagnosis present

## 2023-07-15 DIAGNOSIS — Z7901 Long term (current) use of anticoagulants: Secondary | ICD-10-CM | POA: Diagnosis not present

## 2023-07-15 DIAGNOSIS — R0989 Other specified symptoms and signs involving the circulatory and respiratory systems: Secondary | ICD-10-CM | POA: Diagnosis not present

## 2023-07-15 DIAGNOSIS — J90 Pleural effusion, not elsewhere classified: Secondary | ICD-10-CM | POA: Diagnosis present

## 2023-07-15 DIAGNOSIS — R279 Unspecified lack of coordination: Secondary | ICD-10-CM | POA: Diagnosis not present

## 2023-07-15 DIAGNOSIS — C50111 Malignant neoplasm of central portion of right female breast: Secondary | ICD-10-CM | POA: Diagnosis not present

## 2023-07-15 LAB — COMPREHENSIVE METABOLIC PANEL
ALT: 49 U/L — ABNORMAL HIGH (ref 0–44)
AST: 60 U/L — ABNORMAL HIGH (ref 15–41)
Albumin: 2.2 g/dL — ABNORMAL LOW (ref 3.5–5.0)
Alkaline Phosphatase: 96 U/L (ref 38–126)
Anion gap: 11 (ref 5–15)
BUN: 17 mg/dL (ref 8–23)
CO2: 19 mmol/L — ABNORMAL LOW (ref 22–32)
Calcium: 8.2 mg/dL — ABNORMAL LOW (ref 8.9–10.3)
Chloride: 102 mmol/L (ref 98–111)
Creatinine, Ser: 1.22 mg/dL — ABNORMAL HIGH (ref 0.44–1.00)
GFR, Estimated: 42 mL/min — ABNORMAL LOW (ref >60)
Glucose, Bld: 120 mg/dL — ABNORMAL HIGH (ref 70–99)
Potassium: 4.2 mmol/L (ref 3.5–5.1)
Sodium: 132 mmol/L — ABNORMAL LOW (ref 135–145)
Total Bilirubin: 1.3 mg/dL — ABNORMAL HIGH (ref <1.2)
Total Protein: 7.1 g/dL (ref 6.5–8.1)

## 2023-07-15 LAB — CBC WITH DIFFERENTIAL/PLATELET
Abs Immature Granulocytes: 0 10*3/uL (ref 0.00–0.07)
Band Neutrophils: 3 %
Basophils Absolute: 0 10*3/uL (ref 0.0–0.1)
Basophils Relative: 0 %
Eosinophils Absolute: 0 10*3/uL (ref 0.0–0.5)
Eosinophils Relative: 0 %
HCT: 24.3 % — ABNORMAL LOW (ref 36.0–46.0)
Hemoglobin: 8.3 g/dL — ABNORMAL LOW (ref 12.0–15.0)
Lymphocytes Relative: 34 %
Lymphs Abs: 0.7 10*3/uL (ref 0.7–4.0)
MCH: 32.9 pg (ref 26.0–34.0)
MCHC: 34.2 g/dL (ref 30.0–36.0)
MCV: 96.4 fL (ref 80.0–100.0)
Metamyelocytes Relative: 1 %
Monocytes Absolute: 0.2 10*3/uL (ref 0.1–1.0)
Monocytes Relative: 10 %
Neutro Abs: 1.2 10*3/uL — ABNORMAL LOW (ref 1.7–7.7)
Neutrophils Relative %: 52 %
Platelets: 102 10*3/uL — ABNORMAL LOW (ref 150–400)
RBC: 2.52 MIL/uL — ABNORMAL LOW (ref 3.87–5.11)
RDW: 19.8 % — ABNORMAL HIGH (ref 11.5–15.5)
WBC: 2.1 10*3/uL — ABNORMAL LOW (ref 4.0–10.5)
nRBC: 0 % (ref 0.0–0.2)

## 2023-07-15 LAB — URINALYSIS, W/ REFLEX TO CULTURE (INFECTION SUSPECTED)
Bilirubin Urine: NEGATIVE
Glucose, UA: NEGATIVE mg/dL
Hgb urine dipstick: NEGATIVE
Ketones, ur: 5 mg/dL — AB
Leukocytes,Ua: NEGATIVE
Nitrite: NEGATIVE
Protein, ur: >=300 mg/dL — AB
Specific Gravity, Urine: 1.025 (ref 1.005–1.030)
pH: 5 (ref 5.0–8.0)

## 2023-07-15 LAB — PROCALCITONIN: Procalcitonin: 16.41 ng/mL

## 2023-07-15 LAB — LACTIC ACID, PLASMA
Lactic Acid, Venous: 1.5 mmol/L (ref 0.5–1.9)
Lactic Acid, Venous: 1.4 mmol/L (ref 0.5–1.9)

## 2023-07-15 LAB — RESP PANEL BY RT-PCR (RSV, FLU A&B, COVID)  RVPGX2
Influenza A by PCR: NEGATIVE
Influenza B by PCR: NEGATIVE
Resp Syncytial Virus by PCR: NEGATIVE
SARS Coronavirus 2 by RT PCR: NEGATIVE

## 2023-07-15 LAB — PROTIME-INR
INR: 2.5 — ABNORMAL HIGH (ref 0.8–1.2)
Prothrombin Time: 27.2 s — ABNORMAL HIGH (ref 11.4–15.2)

## 2023-07-15 MED ORDER — POLYETHYLENE GLYCOL 3350 17 G PO PACK: 17.0000 g | PACK | Freq: Every day | ORAL | Status: DC | PRN

## 2023-07-15 MED ORDER — ONDANSETRON HCL 4 MG PO TABS
4.0000 mg | ORAL_TABLET | Freq: Four times a day (QID) | ORAL | Status: DC | PRN
  Administered 2023-07-16: 4 mg via ORAL
  Filled 2023-07-15: qty 1

## 2023-07-15 MED ORDER — ONDANSETRON HCL 4 MG/2ML IJ SOLN
4.0000 mg | Freq: Four times a day (QID) | INTRAMUSCULAR | Status: DC | PRN
Start: 2023-07-15 — End: 2023-07-19

## 2023-07-15 MED ORDER — APIXABAN 2.5 MG PO TABS
2.5000 mg | ORAL_TABLET | Freq: Two times a day (BID) | ORAL | Status: DC
  Administered 2023-07-15 – 2023-07-19 (×8): 2.5 mg via ORAL
  Filled 2023-07-15 (×8): qty 1

## 2023-07-15 MED ORDER — SODIUM CHLORIDE 0.9 % IV SOLN: INTRAVENOUS | Status: AC

## 2023-07-15 MED ORDER — MIRTAZAPINE 15 MG PO TABS
7.5000 mg | ORAL_TABLET | Freq: Every day | ORAL | Status: DC
  Administered 2023-07-15 – 2023-07-18 (×4): 7.5 mg via ORAL
  Filled 2023-07-15 (×4): qty 1

## 2023-07-15 MED ORDER — LEVOTHYROXINE SODIUM 50 MCG PO TABS
50.0000 ug | ORAL_TABLET | Freq: Every day | ORAL | Status: DC
  Administered 2023-07-16 – 2023-07-19 (×4): 50 ug via ORAL
  Filled 2023-07-15 (×4): qty 1

## 2023-07-15 MED ORDER — SODIUM CHLORIDE 0.9 % IV SOLN
1.0000 g | Freq: Once | INTRAVENOUS | Status: AC
  Administered 2023-07-15: 1 g via INTRAVENOUS
  Filled 2023-07-15: qty 10

## 2023-07-15 MED ORDER — ACETAMINOPHEN 650 MG RE SUPP: 650.0000 mg | Freq: Four times a day (QID) | RECTAL | Status: DC | PRN

## 2023-07-15 MED ORDER — SODIUM CHLORIDE 0.9 % IV BOLUS
1000.0000 mL | Freq: Once | INTRAVENOUS | Status: AC
  Administered 2023-07-15: 1000 mL via INTRAVENOUS

## 2023-07-15 MED ORDER — SODIUM CHLORIDE 0.9 % IV SOLN
2.0000 g | INTRAVENOUS | Status: DC
  Administered 2023-07-16 – 2023-07-17 (×2): 2 g via INTRAVENOUS
  Filled 2023-07-15 (×3): qty 20

## 2023-07-15 MED ORDER — AZITHROMYCIN 500 MG IV SOLR
500.0000 mg | INTRAVENOUS | Status: DC
  Administered 2023-07-16 – 2023-07-17 (×2): 500 mg via INTRAVENOUS
  Filled 2023-07-15 (×2): qty 5

## 2023-07-15 MED ORDER — ACETAMINOPHEN 500 MG PO TABS
1000.0000 mg | ORAL_TABLET | Freq: Once | ORAL | Status: AC
  Administered 2023-07-15: 1000 mg via ORAL
  Filled 2023-07-15: qty 2

## 2023-07-15 MED ORDER — FLUTICASONE PROPIONATE 50 MCG/ACT NA SUSP: 2.0000 | Freq: Every day | NASAL | Status: DC | PRN

## 2023-07-15 MED ORDER — PANTOPRAZOLE SODIUM 20 MG PO TBEC
20.0000 mg | DELAYED_RELEASE_TABLET | Freq: Every day | ORAL | Status: DC
Start: 2023-07-16 — End: 2023-07-16
  Filled 2023-07-15 (×2): qty 1

## 2023-07-15 MED ORDER — SODIUM CHLORIDE 0.9 % IV SOLN
500.0000 mg | Freq: Once | INTRAVENOUS | Status: AC
  Administered 2023-07-15: 500 mg via INTRAVENOUS
  Filled 2023-07-15: qty 5

## 2023-07-15 MED ORDER — ACETAMINOPHEN 325 MG PO TABS
650.0000 mg | ORAL_TABLET | Freq: Four times a day (QID) | ORAL | Status: DC | PRN
  Administered 2023-07-16: 650 mg via ORAL
  Filled 2023-07-15 (×2): qty 2

## 2023-07-15 NOTE — H&P (Signed)
History and Physical    Lisa Klein QMV:[id number] DOB: July 03, 1930 DOA: 07/15/2023  PCP: Kerri Perches, MD   Patient coming from: Home   I have personally briefly reviewed patient's old medical records in [facility]  Chief Complaint: Lethargy  HPI: Lisa Klein is a 89 y.o. female with medical history significant for DVT and PE, fibrillation on chronic anticoagulation, hypertension, breast cancer, new AML diagnosis. Presented to the ED with reports of fevers, lethargy, reduced oral intake.  At the time of my evaluation, patient is a bit lethargic but awake and able to answer questions.  Patient's son [name] is at bedside.  Reports since getting home, patient clinically did well, but subsequently started having difficulty breathing, and cough.  She has been generally weak, lethargic and sleeping most of the day. She denies vomiting or diarrhea, she denies urinary symptoms.    Recent hospitalization 11/17 to 11/26-admitted with fevers, pancytopenia, with negative cultures.  Spiking fevers despite broad spectrum IV antibiotics. She was diagnosed with acute leukemia following bone marrow biopsy results.  Antibiotics were discontinued.  Patient was discharged home with home health services and palliative care with anticipation to transition to hospice in the near future.  ED Course: Tmax of 101.5.  Heart rate 84-113, respirate rate 17-24.  Blood pressure systolic 96-108.  O2 sats 94 to 100% on 2 L. WBC 2.1. Initial chest x-ray shows increased retrocardiac density, 2 view chest x-ray done as recommended -also showed persistent left retrocardiac density-related to tortuous aorta but underlying lesion not excluded.  Bilateral pleural effusions present.   IV ceftriaxone and azithromycin started. Hospitalist admit for pneumonia, generalized weakness  Review of Systems: As per HPI all other systems reviewed and negative.  Past Medical History:  Diagnosis Date   Cancer (HCC) 2012    LEFT BREAST   Clotting disorder (HCC) 05/2011   dvt, right leg   Degenerative disc disease    with nerve compression    DJD (degenerative joint disease)    Hypertension    Hypertension    Hypothyroidism    Hypothyroidism    Long term current use of anticoagulant therapy 12/07/2014   Pulmonary embolus    Obesity    Osteopenia 08/24/2014    Past Surgical History:  Procedure Laterality Date   ABDOMINAL HYSTERECTOMY  1980   fibroids   BONE MARROW ASPIRATION  06/28/2023   BONE MARROW BIOPSY  06/28/2023   BREAST SURGERY  2012   left total mastectomy   CHOLECYSTECTOMY  80'S   [facility]   CHOLECYSTECTOMY     HIP PINNING,CANNULATED Left 01/19/2013   Procedure: CANNULATED HIP PINNING-  left;  Surgeon: Senaida Lange, MD;  Location: [facility] OR;  Service: Orthopedics;  Laterality: Left;   MASTECTOMY MODIFIED RADICAL  02/14/11   left   PORT-A-CATH REMOVAL N/A 12/05/2012   Procedure: MINOR REMOVAL PORT-A-CATH;  Surgeon: Dalia Heading, MD;  Location: [facility] ORS;  Service: General;  Laterality: N/A;  In Minor Room   PORTACATH PLACEMENT  04/16/2011   Procedure: INSERTION PORT-A-CATH;  Surgeon: Dalia Heading;  Location: [facility] ORS;  Service: General;  Laterality: Right;  right subclavian   VESICOVAGINAL FISTULA CLOSURE W/ TAH     [facility]     reports that she has never smoked. She has never used smokeless tobacco. She reports that she does not drink alcohol and does not use drugs.  Allergies  Allergen Reactions   Listerine [Antiseptic Mouth Rinse] Other (See Comments)    Caused  sore throat   Singulair [Montelukast Sodium] Other (See Comments)    Abdominal spasm     Family History  Problem Relation Age of Onset   COPD Father    Lung disease Father    Hypertension Sister    Arthritis Mother    Hypertension Sister    Heart disease Son        stent   Anesthesia problems Neg Hx    Malignant hyperthermia Neg Hx    Hypotension Neg Hx    Pseudochol deficiency Neg Hx    Prior to Admission medications    Medication Sig Start Date End Date Taking? Authorizing Provider  acetaminophen (TYLENOL) 650 MG CR tablet Take 650 mg by mouth 2 (two) times daily.   Yes [provider]  apixaban (ELIQUIS) 2.5 MG TABS tablet Take 1 tablet (2.5 mg total) by mouth 2 (two) times daily. 07/02/23  Yes Shah, Pratik D, DO  Biotin w/ Vitamins C & E (HAIR/SKIN/NAILS PO) Take 1 tablet by mouth daily.   Yes [provider]  calcium-vitamin D (OSCAL WITH D) 500-200 MG-UNIT tablet Take 1 tablet by mouth 2 (two) times daily.   Yes [provider]  Cod Liver Oil 1000 MG CAPS Take 1 capsule by mouth daily.    Yes [provider]  denosumab (PROLIA) 60 MG/ML SOLN injection Inject 60 mg into the skin every 6 (six) months. Administer in upper arm, thigh, or abdomen   Yes [provider]  diclofenac Sodium (VOLTAREN ARTHRITIS PAIN) 1 % GEL Apply 2 g topically 4 (four) times daily. 07/02/23  Yes Shah, Pratik D, DO  fluticasone (FLONASE) 50 MCG/ACT nasal spray Place 2 sprays into both nostrils daily. Patient taking differently: Place 2 sprays into both nostrils daily as needed for allergies. 04/23/23  Yes Kerri Perches, MD  latanoprost (XALATAN) 0.005 % ophthalmic solution Place 1 drop into both eyes at bedtime. 07/20/21  Yes [provider]  levothyroxine (SYNTHROID) 50 MCG tablet Take 1 tablet (50 mcg total) by mouth daily. 06/18/23  Yes Kerri Perches, MD  lovastatin (MEVACOR) 20 MG tablet TAKE 1 TABLET AT BEDTIME 05/08/23  Yes Kerri Perches, MD  metoprolol tartrate (LOPRESSOR) 25 MG tablet Take 1 tablet (25 mg total) by mouth 2 (two) times daily. 07/02/23 08/01/23 Yes Shah, Pratik D, DO  mirtazapine (REMERON) 7.5 MG tablet Take 1 tablet (7.5 mg total) by mouth at bedtime. 07/12/23  Yes Kerri Perches, MD  pantoprazole (PROTONIX) 20 MG tablet TAKE 1 TABLET EVERY DAY 11/30/22  Yes Kerri Perches, MD  potassium chloride (KLOR-CON M) 10 MEQ tablet TAKE 1  TABLET TWICE DAILY 12/12/22  Yes Kerri Perches, MD  vitamin E 1000 UNIT capsule Take 1,000 Units by mouth daily.   Yes [provider]    Physical Exam: Vitals:   07/15/23 1400 07/15/23 1401 07/15/23 1641 07/15/23 1645  BP: 108/72   96/63  Pulse: (!) 113   89  Resp: 17   20  Temp: (!) 101.5 F (38.6 C)  99.5 F (37.5 C)   TempSrc: Rectal  Rectal   SpO2: 94%   93%  Weight:  77.1 kg    Height:  5\' 4"  (1.626 m)      Constitutional: NAD, calm, comfortable Vitals:   07/15/23 1400 07/15/23 1401 07/15/23 1641 07/15/23 1645  BP: 108/72   96/63  Pulse: (!) 113   89  Resp: 17   20  Temp: (!) 101.5 F (38.6 C)  99.5 F (37.5 C)   TempSrc: Rectal  Rectal   SpO2: 94%   93%  Weight:  77.1 kg    Height:  5\' 4"  (1.626 m)     Eyes: PERRL, lids and conjunctivae normal ENMT: Mucous membranes are markedly dry.  Neck: normal, supple, no masses, no thyromegaly Respiratory: clear to auscultation bilaterally, no wheezing, no crackles. Normal respiratory effort. No accessory muscle use.  Cardiovascular: Regular rate and rhythm, no murmurs / rubs / gallops. No extremity edema. 2+ pedal pulses. No carotid bruits.  Abdomen: no tenderness, no masses palpated. No hepatosplenomegaly. Bowel sounds positive.  Musculoskeletal: no clubbing / cyanosis. No joint deformity upper and lower extremities. Good ROM, no contractures. Normal muscle tone.  Skin: no rashes, lesions, ulcers. No induration Neurologic: Moving all extremities spontaneously, speech fluent, no facial asymmetry.  Psychiatric: Mildly lethargic, otherwise awake and oriented x 2 to person and place only.  Labs on Admission: I have personally reviewed following labs and imaging studies  CBC: Recent Labs  Lab 07/15/23 1415  WBC 2.1*  NEUTROABS 1.2*  HGB 8.3*  HCT 24.3*  MCV 96.4  PLT 102*   Basic Metabolic Panel: Recent Labs  Lab 07/15/23 1415  NA 132*  K 4.2  CL 102  CO2 19*  GLUCOSE 120*  BUN 17  CREATININE  1.22*  CALCIUM 8.2*   GFR: Estimated Creatinine Clearance: 30.2 mL/min (A) (by C-G formula based on SCr of 1.22 mg/dL (H)). Liver Function Tests: Recent Labs  Lab 07/15/23 1415  AST 60*  ALT 49*  ALKPHOS 96  BILITOT 1.3*  PROT 7.1  ALBUMIN 2.2*   Coagulation Profile: Recent Labs  Lab 07/15/23 1415  INR 2.5*   Urine analysis:    Component Value Date/Time   COLORURINE AMBER (A) 07/15/2023 1640   APPEARANCEUR CLOUDY (A) 07/15/2023 1640   LABSPEC 1.025 07/15/2023 1640   PHURINE 5.0 07/15/2023 1640   GLUCOSEU NEGATIVE 07/15/2023 1640   HGBUR NEGATIVE 07/15/2023 1640   BILIRUBINUR NEGATIVE 07/15/2023 1640   BILIRUBINUR negative 07/17/2012 1058   KETONESUR 5 (A) 07/15/2023 1640   PROTEINUR >=300 (A) 07/15/2023 1640   UROBILINOGEN 1.0 01/19/2013 0105   NITRITE NEGATIVE 07/15/2023 1640   LEUKOCYTESUR NEGATIVE 07/15/2023 1640    Radiological Exams on Admission: DG Chest Port 1 View  Result Date: 07/15/2023 CLINICAL DATA:  Sepsis, decreased appetite, previous pneumonia EXAM: PORTABLE CHEST 1 VIEW COMPARISON:  06/25/2023 FINDINGS: Single frontal view of the chest demonstrates an enlarged cardiac silhouette. There is increased density in the retrocardiac region along the medial costophrenic angle, which could reflect left lower lobe consolidation. No other airspace disease, effusion, or pneumothorax. No acute bony abnormalities. IMPRESSION: 1. Increased retrocardiac density in the left medial costophrenic angle, which could reflect lung consolidation or vascular shadow. Further evaluation with two-view chest x-ray may be useful when clinical situation permits. Electronically Signed   By: Sharlet Salina M.D.   On: 07/15/2023 15:49    EKG: Independently reviewed.  Atrial fibrillation, rate 101, QTc 454.  No significant change from prior.  Assessment/Plan Principal Problem:   Acute metabolic encephalopathy Active Problems:   Pancytopenia (HCC)   SIRS (systemic inflammatory  response syndrome) (HCC)   DVT AND Pulm Embolism-H/o Lt Leg  DVT and PE -----diagnosed 10/19/2022   Essential hypertension   Acute myeloid leukemia not having achieved remission (HCC)  Assessment and Plan: * Acute metabolic encephalopathy Per family lethargy, generalized weakness, poor oral intake, patient sleeping most of the day.  On my  evaluation she is awake, able to answer questions.  No falls.  She appears very dehydrated.  -Defer head imaging for now. -Hydrate N/s 75cc/hr x 20hrs   SIRS (systemic inflammatory response syndrome) (HCC) Meeting SIRS criteria with fever of 101.5, tachycardia heart rate 89-113, leukopenia of 2.1.  Normal lactic acid 1.5.  Chest x-ray showing persistent retrocardiac opacity.  UA not suggestive of UTI.  Recent hospitalization with persistent fevers despite broad spectrum antibiotics, was diagnosed with AML.  - Pro-calcitonin -COVID test negative -Follow-up blood cultures -Obtain CT chest, rule out infectious etiology -Continue IV ceftriaxone and azithromycin for now pending CT results -Palliative care consult  Pancytopenia (HCC) Pancytopenia in the setting of AML.  WBC 2.1.  Hemoglobin 8.3.  Platelets 102.  DVT AND Pulm Embolism-H/o Lt Leg  DVT and PE -----diagnosed 10/19/2022 Resume Eliquis  Acute myeloid leukemia not having achieved remission (HCC) Underwent bone marrow biopsy during recent hospitalization, with diagnosis of acute leukemia.  Seen by oncology, patient did not wish to follow-up on her AML diagnosis. -Palliative care consult  Essential hypertension Soft blood pressure systolic 96-108. -Hold metoprolol 25 twice daily for now   DVT prophylaxis: Eliquis Code Status: Full code for now, previous documentation states otherwise. I talked to patient and son at bedside, they are unsure if they want DNR status and would like to defer for now.  Family Communication: Son [name] at bedside. Disposition Plan: ~ 2 days Consults called:  none Admission status:  Inpatient, telemetry I certify that at the point of admission it is my clinical judgment that the patient will require inpatient hospital care spanning beyond 2 midnights from the point of admission due to high intensity of service, high risk for further deterioration and high frequency of surveillance required.   Author: Onnie Boer, MD 07/15/2023 10:05 PM  For on call review [email or web address].

## 2023-07-15 NOTE — Telephone Encounter (Signed)
  Chief Complaint: fatigue Symptoms: lethargic, decreased appetite, severe decline in ability to move, fever,  Frequency: since Friday Pertinent Negatives: Patient denies chest pain, SOB Disposition: [x] ED /[] Urgent Care (no appt availability in office) / [] Appointment(In office/virtual)/ []  [facility]/ [] Home Care/ [] Refused Recommended Disposition /[] [place] [facility]/ []  Follow-up with PCP Additional Notes: Patients daughter and [facility] PT called in reporting that since Friday patient has experienced a drastic decline. Daughter reports patient is now very lethargic and sleeps majority of the day, has not been able to get up and move or feed herself, has had further decrease in appetite, and has been running a fever. Patients daughter reports that patients current symptoms are very far from her baseline. Per protocol this RN recommended ED. Patients daughter and [facility] PT verbalized understanding.    Copied from CRM [id number]. Topic: Clinical - Medical Advice >> Jul 15, 2023 12:43 PM Alcus Dad H wrote: Reason for CRM: [name] from physical therapy is at home with sister Cordelia Pen and they are concerned because patient's health has severely declined since Friday, pt can no longer walk, has no appetite and patient's sister is having to feed her, temp is 100.9 and her oxygen level is 87 Reason for Disposition  [1] SEVERE weakness (i.e., unable to walk or barely able to walk, requires support) AND [2] new-onset or worsening  Answer Assessment - Initial Assessment Questions 1. DESCRIPTION: "Describe how you are feeling."     She is feeling sick 2. SEVERITY: "How bad is it?"  "Can you stand and walk?"   - MILD (0-3): Feels weak or tired, but does not interfere with work, school or normal activities.   - MODERATE (4-7): Able to stand and walk; weakness interferes with work, school, or normal activities.   - SEVERE (8-10): Unable to stand or walk; unable to do usual activities.     severe 3. ONSET:  "When did these symptoms begin?" (e.g., hours, days, weeks, months)     Friday 4. CAUSE: "What do you think is causing the weakness or fatigue?" (e.g., not drinking enough fluids, medical problem, trouble sleeping)     Trouble sleeping, no appetite, 5. NEW MEDICINES:  "Have you started on any new medicines recently?" (e.g., opioid pain medicines, benzodiazepines, muscle relaxants, antidepressants, antihistamines, neuroleptics, beta blockers)     No changes 6. OTHER SYMPTOMS: "Do you have any other symptoms?" (e.g., chest pain, fever, cough, SOB, vomiting, diarrhea, bleeding, other areas of pain)     Decrease appetite, decrease in movement, fever, lethargic  Protocols used: Weakness (Generalized) and Fatigue-A-AH

## 2023-07-15 NOTE — Assessment & Plan Note (Signed)
Resume Eliquis

## 2023-07-15 NOTE — ED Notes (Signed)
Pts O2 is between 88-90% placed pt on 2L, EPD aware.

## 2023-07-15 NOTE — ED Triage Notes (Signed)
Pt bib [facility] with complaints of not feeling well today. Pt was diagnosis with PNA in November and states she just hasn't felt well since. Pt went to PCP a couple days ago with complaints of no appetite and not being able to sleep.

## 2023-07-15 NOTE — ED Provider Notes (Signed)
[place] [facility] AT [facility] Provider Note   CSN: [id number] Arrival date & time: 07/15/23  1332     History  Chief Complaint  Patient presents with   Weakness    Lisa Klein is a 89 y.o. female.  With a history of atrial fibrillation on Eliquis, hypertension, leukemia and breast cancer who presents to the ED for general malaise.  Patient was recently discharged in late November after inpatient treatment for community-acquired pneumonia.  She has been recovering at her sister's house following the time of discharge for the last few weeks.  Sister voices concern for increased generalized weakness, decreased appetite and subjective fevers at home.  Patient herself notes a persistent cough over the last week.  No chest pain, shortness of breath at rest, abdominal pain, vomiting or urinary symptoms to speak of.  She reports compliance with Eliquis.  Recent diagnosis of leukemia however patient along with her family have elected not to pursue aggressive treatment.   Weakness      Home Medications Prior to Admission medications   Medication Sig Start Date End Date Taking? Authorizing Provider  acetaminophen (TYLENOL) 650 MG CR tablet Take 650 mg by mouth 2 (two) times daily.   Yes [provider]  apixaban (ELIQUIS) 2.5 MG TABS tablet Take 1 tablet (2.5 mg total) by mouth 2 (two) times daily. 07/02/23   Sherryll Burger, Pratik D, DO  Biotin w/ Vitamins C & E (HAIR/SKIN/NAILS PO) Take by mouth.    [provider]  calcium-vitamin D (OSCAL WITH D) 500-200 MG-UNIT tablet Take 1 tablet by mouth 2 (two) times daily.    [provider]  clotrimazole-betamethasone (LOTRISONE) cream Apply 1 Application topically 2 (two) times daily. 10/11/22   Kerri Perches, MD  Cod Liver Oil 1000 MG CAPS Take 1 capsule by mouth daily.     [provider]  denosumab (PROLIA) 60 MG/ML SOLN injection Inject 60 mg into the skin every 6 (six) months.  Administer in upper arm, thigh, or abdomen    [provider]  diclofenac Sodium (VOLTAREN ARTHRITIS PAIN) 1 % GEL Apply 2 g topically 4 (four) times daily. 07/02/23   Sherryll Burger, Pratik D, DO  docusate sodium (COLACE) 100 MG capsule Take 100 mg by mouth daily as needed for constipation. For constipation    [provider]  fluticasone (FLONASE) 50 MCG/ACT nasal spray Place 2 sprays into both nostrils daily. Patient taking differently: Place 2 sprays into both nostrils daily as needed for allergies. 04/23/23   Kerri Perches, MD  latanoprost (XALATAN) 0.005 % ophthalmic solution Place 1 drop into both eyes at bedtime. 07/20/21   [provider]  levothyroxine (SYNTHROID) 50 MCG tablet Take 1 tablet (50 mcg total) by mouth daily. 06/18/23   Kerri Perches, MD  lovastatin (MEVACOR) 20 MG tablet TAKE 1 TABLET AT BEDTIME 05/08/23   Kerri Perches, MD  metoprolol tartrate (LOPRESSOR) 25 MG tablet Take 1 tablet (25 mg total) by mouth 2 (two) times daily. 07/02/23 08/01/23  Sherryll Burger, Pratik D, DO  mirtazapine (REMERON) 7.5 MG tablet Take 1 tablet (7.5 mg total) by mouth at bedtime. 07/12/23   Kerri Perches, MD  pantoprazole (PROTONIX) 20 MG tablet TAKE 1 TABLET EVERY DAY 11/30/22   Kerri Perches, MD  potassium chloride (KLOR-CON M) 10 MEQ tablet TAKE 1 TABLET TWICE DAILY 12/12/22   Kerri Perches, MD  vitamin E 1000 UNIT capsule Take 1,000 Units by mouth daily.  [provider]      Allergies    Listerine [antiseptic mouth rinse] and Singulair [montelukast sodium]    Review of Systems   Review of Systems  Neurological:  Positive for weakness.    Physical Exam Updated Vital Signs BP 108/72   Pulse (!) 113   Temp (!) 101.5 F (38.6 C) (Rectal)   Resp 17   Ht 5\' 4"  (1.626 m)   Wt 77.1 kg   SpO2 94%   BMI 29.18 kg/m  Physical Exam Vitals and nursing note reviewed.  HENT:     Head: Normocephalic and atraumatic.  Eyes:     Pupils:  Pupils are equal, round, and reactive to light.  Cardiovascular:     Rate and Rhythm: Tachycardia present. Rhythm irregular.  Pulmonary:     Effort: Pulmonary effort is normal.     Breath sounds: Normal breath sounds.  Abdominal:     Palpations: Abdomen is soft.     Tenderness: There is no abdominal tenderness.  Skin:    General: Skin is warm and dry.  Neurological:     Mental Status: She is alert.  Psychiatric:        Mood and Affect: Mood normal.     ED Results / Procedures / Treatments   Labs (all labs ordered are listed, but only abnormal results are displayed) Labs Reviewed  COMPREHENSIVE METABOLIC PANEL - Abnormal; Notable for the following components:      Result Value   Sodium 132 (*)    CO2 19 (*)    Glucose, Bld 120 (*)    Creatinine, Ser 1.22 (*)    Calcium 8.2 (*)    Albumin 2.2 (*)    AST 60 (*)    ALT 49 (*)    Total Bilirubin 1.3 (*)    GFR, Estimated 42 (*)    All other components within normal limits  CBC WITH DIFFERENTIAL/PLATELET - Abnormal; Notable for the following components:   WBC 2.1 (*)    RBC 2.52 (*)    Hemoglobin 8.3 (*)    HCT 24.3 (*)    RDW 19.8 (*)    Platelets 102 (*)    Neutro Abs 1.2 (*)    All other components within normal limits  PROTIME-INR - Abnormal; Notable for the following components:   Prothrombin Time 27.2 (*)    INR 2.5 (*)    All other components within normal limits  CULTURE, BLOOD (ROUTINE X 2)  CULTURE, BLOOD (ROUTINE X 2)  RESP PANEL BY RT-PCR (RSV, FLU A&B, COVID)  [id number]  LACTIC ACID, PLASMA  LACTIC ACID, PLASMA  URINALYSIS, W/ REFLEX TO CULTURE (INFECTION SUSPECTED)    EKG EKG Interpretation Date/Time:  Monday July 15 2023 14:04:31 EST Ventricular Rate:  101 PR Interval:    QRS Duration:  81 QT Interval:  350 QTC Calculation: 454 R Axis:   -6  Text Interpretation: Atrial fibrillation Borderline repolarization abnormality Confirmed by Estelle June [phone]) on 07/15/2023 4:06:15  PM  Radiology DG Chest Port 1 View  Result Date: 07/15/2023 CLINICAL DATA:  Sepsis, decreased appetite, previous pneumonia EXAM: PORTABLE CHEST 1 VIEW COMPARISON:  06/25/2023 FINDINGS: Single frontal view of the chest demonstrates an enlarged cardiac silhouette. There is increased density in the retrocardiac region along the medial costophrenic angle, which could reflect left lower lobe consolidation. No other airspace disease, effusion, or pneumothorax. No acute bony abnormalities. IMPRESSION: 1. Increased retrocardiac density in the left medial costophrenic angle, which could reflect lung consolidation or  vascular shadow. Further evaluation with two-view chest x-ray may be useful when clinical situation permits. Electronically Signed   By: Sharlet Salina M.D.   On: 07/15/2023 15:49    Procedures Procedures    Medications Ordered in ED Medications  azithromycin (ZITHROMAX) 500 mg in sodium chloride 0.9 % 250 mL IVPB (500 mg Intravenous New Bag/Given 07/15/23 1532)  sodium chloride 0.9 % bolus 1,000 mL (1,000 mLs Intravenous Bolus 07/15/23 1451)  cefTRIAXone (ROCEPHIN) 1 g in sodium chloride 0.9 % 100 mL IVPB (0 g Intravenous Stopped 07/15/23 1521)  acetaminophen (TYLENOL) tablet 1,000 mg (1,000 mg Oral Given 07/15/23 1532)    ED Course/ Medical Decision Making/ A&P Clinical Course as of 07/15/23 1606  Mon Jul 15, 2023  1551 No relative leukocytosis.  Initial lactate of 1.5.  COVID RSV influenza negative.  Chest x-ray may be suggestive of a left-sided pneumonia but we will need a two-view to get a better look.  Awaiting UA.  Empiric coverage for community-acquired pneumonia with ceftriaxone and azithromycin  I, Estelle June DO, am transitioning care of this patient to the oncoming provider pending UA, two-view chest x-ray and likely admission [MP]    Clinical Course User Index [MP] Royanne Foots, DO                                 Medical Decision Making 89 year old female with  history as above presenting for general malaise, decreased appetite and coughing.  Recent admission for treatment of community-acquired pneumonia.  Initial vital signs notable for fever and tachycardia meeting SIRS criteria.  Will cover for suspected respiratory source with ceftriaxone and azithromycin and provide IV fluids for rehydration.  Tylenol for fever control.  Will obtain infectious workup including labs, blood cultures, venous lactate, UA and chest x-ray.  Admission likely.  Amount and/or Complexity of Data Reviewed Labs: ordered. Radiology: ordered.  Risk OTC drugs.           Final Clinical Impression(s) / ED Diagnoses Final diagnoses:  Pneumonia of left lower lobe due to infectious organism  Atrial fibrillation, unspecified type [facility])    Rx / DC Orders ED Discharge Orders     None         Royanne Foots, DO 07/15/23 1606

## 2023-07-15 NOTE — Assessment & Plan Note (Signed)
Pancytopenia in the setting of AML.  WBC 2.1.  Hemoglobin 8.3.  Platelets 102.

## 2023-07-15 NOTE — Assessment & Plan Note (Signed)
Per family lethargy, generalized weakness, poor oral intake, patient sleeping most of the day.  On my evaluation she is awake, able to answer questions.  No falls.  She appears very dehydrated.  -Defer head imaging for now. -Hydrate N/s 75cc/hr x 20hrs

## 2023-07-15 NOTE — ED Provider Notes (Signed)
Care of patient assumed from Dr. Elayne Snare.  This patient presents for generalized fatigue.  She had a recent diagnosis of pneumonia.  She arrives today febrile and tachycardic.  She was treated empirically for pneumonia recurrence.  Lab work shows baseline leukopenia.  Urinalysis is pending.  She will require admission. Physical Exam  BP 108/72   Pulse (!) 113   Temp (!) 101.5 F (38.6 C) (Rectal)   Resp 17   Ht 5\' 4"  (1.626 m)   Wt 77.1 kg   SpO2 94%   BMI 29.18 kg/m   Physical Exam Vitals and nursing note reviewed.  Constitutional:      General: She is not in acute distress.    Appearance: Normal appearance. She is well-developed. She is ill-appearing. She is not toxic-appearing or diaphoretic.  HENT:     Head: Normocephalic and atraumatic.     Right Ear: External ear normal.     Left Ear: External ear normal.     Nose: Nose normal.     Mouth/Throat:     Mouth: Mucous membranes are moist.  Eyes:     Extraocular Movements: Extraocular movements intact.     Conjunctiva/sclera: Conjunctivae normal.  Cardiovascular:     Rate and Rhythm: Normal rate and regular rhythm.  Pulmonary:     Effort: Pulmonary effort is normal. Tachypnea present. No respiratory distress.     Breath sounds: Normal breath sounds. No wheezing.  Abdominal:     General: There is no distension.     Palpations: Abdomen is soft.     Tenderness: There is no abdominal tenderness.  Musculoskeletal:        General: No deformity.     Cervical back: Normal range of motion and neck supple.  Skin:    General: Skin is warm and dry.     Coloration: Skin is not jaundiced or pale.  Neurological:     General: No focal deficit present.     Mental Status: She is alert and oriented to person, place, and time.  Psychiatric:        Mood and Affect: Mood normal.        Behavior: Behavior normal.     Procedures  Procedures  ED Course / MDM   Clinical Course as of 07/15/23 1603  Mon Jul 15, 2023  1551 No relative  leukocytosis.  Initial lactate of 1.5.  COVID RSV influenza negative.  Chest x-ray may be suggestive of a left-sided pneumonia but we will need a two-view to get a better look.  Awaiting UA.  Empiric coverage for community-acquired pneumonia with ceftriaxone and azithromycin  I, Estelle June DO, am transitioning care of this patient to the oncoming provider pending UA, two-view chest x-ray and likely admission [MP]    Clinical Course User Index [MP] Royanne Foots, DO   Medical Decision Making Amount and/or Complexity of Data Reviewed Labs: ordered. Radiology: ordered.  Risk OTC drugs.   On assessment, patient resting on ED stretcher.  She denies any current areas of pain.  She is tachypneic and mildly hypoxic with SpO2 of 89% on room air.  She was placed on 2 L of supplemental oxygen.  Urinalysis does not show evidence of infection.  Son, who is at bedside, states that she never fully got better after her recent admission.  With ongoing cough or shortness of breath.  Per chart review, she did undergo nuclear med study which was negative for evidence of PE 3 weeks ago.  Given her underlying  leukopenia, patient likely not fully immunocompetent.  I agree with diagnosis of pneumonia.  Patient was admitted for further management.       Gloris Manchester, MD 07/15/23 [phone]

## 2023-07-15 NOTE — Assessment & Plan Note (Signed)
Soft blood pressure systolic 96-108. -Hold metoprolol 25 twice daily for now

## 2023-07-15 NOTE — Assessment & Plan Note (Addendum)
Meeting SIRS criteria with fever of 101.5, tachycardia heart rate 89-113, leukopenia of 2.1.  Normal lactic acid 1.5.  Chest x-ray showing persistent retrocardiac opacity.  UA not suggestive of UTI.  Recent hospitalization with persistent fevers despite broad spectrum antibiotics, was diagnosed with AML.  - Pro-calcitonin -COVID test negative -Follow-up blood cultures -Obtain CT chest, rule out infectious etiology -Continue IV ceftriaxone and azithromycin for now pending CT results -Palliative care consult

## 2023-07-15 NOTE — Assessment & Plan Note (Signed)
Underwent bone marrow biopsy during recent hospitalization, with diagnosis of acute leukemia.  Seen by oncology, recommended against treatment due to age, functional status. - Continue mirtazpapine

## 2023-07-16 ENCOUNTER — Encounter (HOSPITAL_COMMUNITY): Payer: Self-pay | Admitting: Oncology

## 2023-07-16 ENCOUNTER — Encounter (HOSPITAL_COMMUNITY): Payer: Self-pay | Admitting: Internal Medicine

## 2023-07-16 DIAGNOSIS — G9341 Metabolic encephalopathy: Secondary | ICD-10-CM | POA: Diagnosis not present

## 2023-07-16 DIAGNOSIS — Z515 Encounter for palliative care: Secondary | ICD-10-CM

## 2023-07-16 DIAGNOSIS — Z7189 Other specified counseling: Secondary | ICD-10-CM | POA: Diagnosis not present

## 2023-07-16 MED ORDER — ALUM & MAG HYDROXIDE-SIMETH 200-200-20 MG/5ML PO SUSP: 15.0000 mL | Freq: Four times a day (QID) | ORAL | Status: DC | PRN

## 2023-07-16 MED ORDER — PANTOPRAZOLE SODIUM 40 MG PO TBEC
40.0000 mg | DELAYED_RELEASE_TABLET | Freq: Every day | ORAL | Status: DC
Start: 2023-07-16 — End: 2023-07-19
  Administered 2023-07-16 – 2023-07-19 (×4): 40 mg via ORAL
  Filled 2023-07-16 (×4): qty 1

## 2023-07-16 NOTE — Hospital Course (Addendum)
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   The patient was recently admitted to the hospital from 06/23/2023 to 07/02/2023.  During that hospitalization, the patient was treated for pneumonia.  Unfortunately, the patient had persistent fever, pancytopenia, not responding to antibiotics.  Underwent BMBx, found to have AML.  Seen by Oncology, who recommended hospice given age, poor functional status and poor tolerability of chemo.  Palliative Care consulted and patient discharged with [facility].  Since discharge, patient weak and tired, poor PO intake.  On day of admission, [facility] was evaluating patient, noted fever, and sent her to the ER.  During this hospitalization, the patient was started on ceftriaxone and azithromycin.  She was continued on her other home medications.  Her fever curve subsequently improved.  She remained pancytopenic.  CT chest revealed a torturous descending aorta which likely accounts for the patient's retrocardiac opacity.  There was mild interstitial edema.  The patient was seen by palliative medicine.  Goals of care discussions were held.  Ultimately, the patient and family felt it was in the best interest to transition the patient's focus of care to focus on comfort.  The patient was evaluated by Gertie Exon for residential hospice.  She not felt to be a candidate.  After further discussion, the patient will be discharged home with hospice care.  TOC assisted with transition home.

## 2023-07-16 NOTE — TOC Initial Note (Signed)
Transition of Care [facility]) - Initial/Assessment Note    Patient Details  Name: Lisa Klein MRN: [id number] Date of Birth: 04/30/1930  Transition of Care [facility] CM/SW Contact:    Elliot Gault, LCSW Phone Number: 07/16/2023, 2:50 PM  Clinical Narrative:                  Pt admitted from home. She was recently [facility] from [facility] care at her sister's home. Pt also active with [facility] outpatient palliative. Per [facility] Palliative APNP, pt/family requesting hospice care from [facility] at sister's home upon dc.  Referral sent through [facility] MD anticipating dc tomorrow.  TOC will follow.  Expected Discharge Plan: Home w Hospice Care Barriers to Discharge: Continued Medical Work up   Patient Goals and CMS Choice Patient states their goals for this hospitalization and ongoing recovery are:: home with hospice CMS Medicare.gov Compare Post Acute Care list provided to:: Patient Represenative (must comment) Choice offered to / list presented to : Adult Children      Expected Discharge Plan and Services In-house Referral: Clinical Social Work     Living arrangements for the past 2 months: Single Family Home                                      Prior Living Arrangements/Services Living arrangements for the past 2 months: Single Family Home Lives with:: Siblings Patient language and need for interpreter reviewed:: Yes Do you feel safe going back to the place where you live?: Yes      Need for Family Participation in Patient Care: Yes (Comment) Care giver support system in place?: Yes (comment) Current home services: DME, Home PT, Home OT Criminal Activity/Legal Involvement Pertinent to Current Situation/Hospitalization: No - Comment as needed  Activities of Daily Living   ADL Screening (condition at time of admission) Independently performs ADLs?: No Does the patient have a NEW difficulty with bathing/dressing/toileting/self-feeding that is expected  to last >3 days?: No Does the patient have a NEW difficulty with getting in/out of bed, walking, or climbing stairs that is expected to last >3 days?: No Does the patient have a NEW difficulty with communication that is expected to last >3 days?: No Is the patient deaf or have difficulty hearing?: No Does the patient have difficulty seeing, even when wearing glasses/contacts?: No Does the patient have difficulty concentrating, remembering, or making decisions?: Yes  Permission Sought/Granted Permission sought to share information with : Facility Industrial/product designer granted to share information with : Yes, Verbal Permission Granted     Permission granted to share info w AGENCY: Hospice        Emotional Assessment       Orientation: : Oriented to Self, Oriented to Place, Oriented to  Time, Oriented to Situation Alcohol / Substance Use: Not Applicable Psych Involvement: No (comment)  Admission diagnosis:  Acute respiratory failure with hypoxia (HCC) [J96.01] Pneumonia of left lower lobe due to infectious organism [J18.9] Acute metabolic encephalopathy [G93.41] Atrial fibrillation, unspecified type (HCC) [I48.91] Patient Active Problem List   Diagnosis Date Noted   Acute metabolic encephalopathy 07/15/2023   SIRS (systemic inflammatory response syndrome) (HCC) 07/15/2023   Acute myeloid leukemia not having achieved remission (HCC) 07/01/2023   Neutropenia associated with infection (HCC) 06/26/2023   Neutropenia with fever (HCC) 06/25/2023   Pancytopenia (HCC) 06/25/2023   Thrombocytopenia (HCC) 06/24/2023   Sepsis (HCC) 06/24/2023  CAP (community acquired pneumonia) 06/24/2023   Hypomagnesemia 06/24/2023   Hypoalbuminemia due to protein-calorie malnutrition (HCC) 06/24/2023   DVT AND Pulm Embolism-H/o Lt Leg  DVT and PE -----diagnosed 10/19/2022 06/24/2023   Aspiration pneumonia (HCC) 06/23/2023   Dizziness 01/09/2023   Impacted cerumen of both ears 12/03/2022    Osteoarthritis of left knee 10/11/2022   Encounter for annual general medical examination with abnormal findings in adult 04/12/2022   Bilateral shoulder pain 12/19/2021   Fatigue 03/27/2021   Shortness of breath 03/27/2021   GERD (gastroesophageal reflux disease) 03/27/2021   Knee pain, left 02/15/2020   Generalized osteoarthritis of multiple sites 04/02/2018   Dermatomycosis 04/18/2017   Allergic rhinitis 09/01/2015   Obesity 12/13/2014   Long term current use of anticoagulant therapy 12/07/2014   Osteopenia 08/24/2014   Pulmonary embolism (HCC) 10/17/2012   Infiltrating ductal carcinoma of breast (HCC) 04/10/2011   Mixed hyperlipidemia 11/07/2007   Acquired hypothyroidism 08/15/2007   Essential hypertension 08/15/2007   PCP:  Kerri Perches, MD Pharmacy:   [facility] - [place], [place] - [address] [place] [address] Phone: [phone] Fax: [phone]  [facility] - [place], [place] - [address] [place] [address] Phone: [phone] Fax: [phone]     Social Determinants of Health (SDOH) Social History: SDOH Screenings   Food Insecurity: No Food Insecurity (07/15/2023)  Housing: Patient Declined (07/15/2023)  Transportation Needs: No Transportation Needs (07/15/2023)  Utilities: Not At Risk (07/15/2023)  Alcohol Screen: Low Risk  (07/17/2022)  Depression (PHQ2-9): Medium Risk (04/23/2023)  Financial Resource Strain: Low Risk  (07/17/2022)  Physical Activity: Insufficiently Active (07/17/2022)  Social Connections: Socially Isolated (07/17/2022)  Stress: No Stress Concern Present (07/17/2022)  Tobacco Use: Low Risk  (07/16/2023)   SDOH Interventions:     Readmission Risk Interventions    06/25/2023    3:39 PM  Readmission Risk Prevention Plan  Transportation Screening Complete  PCP or Specialist Appt within 5-7 Days Not Complete  Home Care Screening Complete   Medication Review (RN CM) Complete

## 2023-07-16 NOTE — Plan of Care (Signed)
Pt is alert and oriented x 4. Increased weakness poor po intake. Pts sister did bring her chicken noodle soup and pt took some bites after she arrived to unit around 1945.  Pt tolerates thin liquids. Meds whole.  Vitals stable. No prns given.  Problem: Education: Goal: Knowledge of General Education information will improve Description: Including pain rating scale, medication(s)/side effects and non-pharmacologic comfort measures Outcome: Progressing   Problem: Health Behavior/Discharge Planning: Goal: Ability to manage health-related needs will improve Outcome: Progressing   Problem: Clinical Measurements: Goal: Ability to maintain clinical measurements within normal limits will improve Outcome: Progressing Goal: Will remain free from infection Outcome: Progressing Goal: Diagnostic test results will improve Outcome: Progressing Goal: Respiratory complications will improve Outcome: Progressing Goal: Cardiovascular complication will be avoided Outcome: Progressing   Problem: Activity: Goal: Risk for activity intolerance will decrease Outcome: Progressing   Problem: Nutrition: Goal: Adequate nutrition will be maintained Outcome: Progressing   Problem: Coping: Goal: Level of anxiety will decrease Outcome: Progressing   Problem: Elimination: Goal: Will not experience complications related to bowel motility Outcome: Progressing Goal: Will not experience complications related to urinary retention Outcome: Progressing   Problem: Pain Management: Goal: General experience of comfort will improve Outcome: Progressing   Problem: Safety: Goal: Ability to remain free from injury will improve Outcome: Progressing   Problem: Skin Integrity: Goal: Risk for impaired skin integrity will decrease Outcome: Progressing

## 2023-07-16 NOTE — Progress Notes (Signed)
   07/16/23 1532  Assess: MEWS Score  Temp (!) 102.9 F (39.4 C)  BP 134/71  MAP (mmHg) 86  Pulse Rate (!) 116  Resp (!) 24  SpO2 100 %  O2 Device Nasal Cannula  Assess: MEWS Score  MEWS Temp 2  MEWS Systolic 0  MEWS Pulse 2  MEWS RR 1  MEWS LOC 0  MEWS Score 5  MEWS Score Color Red  Assess: if the MEWS score is Yellow or Red  Were vital signs accurate and taken at a resting state? Yes  Does the patient meet 2 or more of the SIRS criteria? Yes  Does the patient have a confirmed or suspected source of infection? Yes  MEWS guidelines implemented  Yes, red  Treat  MEWS Interventions Considered administering scheduled or prn medications/treatments as ordered  Take Vital Signs  Increase Vital Sign Frequency  Red: Q1hr x2, continue Q4hrs until patient remains green for 12hrs  Escalate  MEWS: Escalate Red: Discuss with charge nurse and notify provider. Consider notifying RRT. If remains red for 2 hours consider need for higher level of care  Provider Notification  Provider Name/Title Dr.Danford  Date Provider Notified 07/16/23  Time Provider Notified 1600  Method of Notification Page  Notification Reason Other (Comment) (increased resp,pulse and respirations resulting in red mew)  Provider response No new orders  Date of Provider Response 07/16/23  Assess: SIRS CRITERIA  SIRS Temperature  1  SIRS Pulse 1  SIRS Respirations  1  SIRS WBC 0  SIRS Score Sum  3

## 2023-07-16 NOTE — Consult Note (Signed)
Consultation Note Date: 07/16/2023   Patient Name: Lisa Klein  DOB: 28-Sep-1929  MRN: [id number]  Age / Sex: 89 y.o., female  PCP: Kerri Perches, MD Referring Physician: Alberteen Sam, *  Reason for Consultation: Establishing goals of care  HPI/Patient Profile: 89 y.o. female  with past medical history of newly diagnosed AML November 2024, DVT/PE identified at that time, history of breast cancer, A-fib on anticoag, HTN, DJD, admitted on 07/15/2023 with neutropenic fever, SIRS.   Clinical Assessment and Goals of Care: I have reviewed medical records including EPIC notes, labs and imaging, received report from RN, assessed the patient.  During my first visit Lisa Klein is lying quietly in bed.  She appears acutely/chronically ill and frail.  She states that she is cold.  She is alert and oriented x 3, able to make her needs known.  There is no family at bedside at this time.  We briefly talk about her acute health concerns, but it becomes clear that she needs family present for any discussions.  I see daughter, [name], arrive and I returned to the room.  We meet at the bedside to discuss diagnosis prognosis, GOC, EOL wishes, disposition and options.  I introduced Palliative Medicine as specialized medical care for people living with serious illness. It focuses on providing relief from the symptoms and stress of a serious illness. The goal is to improve quality of life for both the patient and the family.  We discussed a brief life review of the patient.  Lisa Klein had been living independently prior to her illness in November.  At discharge she went to her sister's house.  Initially she did quite well, but has experienced functional decline, in particular over the last week.  Her 7 year old sister has been assisting her with a bedpan.  We then focused on their current illness. We talked  about her acute health concerns, acute leukemia, neutropenic fevers, the treatment plan.  We talked about [name] functional status.  [name] states that when first discharged to her sisters home, Lisa Klein did surprisingly well.  She shares that she has seen a decline in functional status, in particular over the last week.  We talk about IV fluids falsely, and temporarily changing how Lisa Klein looks.  The natural disease trajectory and expectations at EOL were discussed.  We talked about disposition options, anticipate home with hospice care. They are connected with [facility] palliative services were to be seen on the 17th. As a family, they are discussing how to provide around-the-clock care at home.  We talked about PROGNOSIS with permission. This can help people plan for how long they can care for loved one at home.  [name] states she would be happy if her mother could make it to Christmas. I agreed, sharing that she would likely not see February.  Reassurance is provided to Lisa Klein that we will continue to care for her.  We talked about functional status as an indicator.   Advanced directives, concepts specific to code  status, artifical feeding and hydration, and rehospitalization were considered and discussed.  We again today talk about the concept of "treat the treatable, but allow a natural passing".  We talk about the concept of do not rehospitalize especially in light of the fact that Mrs. Bogusz prefers to die in her own home.  Initially, Lisa Klein seems to defer to what her son, Lisa Klein, would want.  Kelly gently reminds her that this is her decision.  We talked about the realities of her chronic and acute illness, what we can and cannot change.  At this point Lisa Klein and [name] are agreeable for "treat the treatable, but allowing natural passing"/DNR.  Orders adjusted.  Hospice and Palliative Care services outpatient were explained and offered.  [name] shares that  they have been in contact with [facility] for outpatient palliative services.  The services were to start for December 17.  We talk in detail about outpatient palliative services and outpatient hospice services.  At this point family states that they feel they need hospice services in the home.  Discussed the importance of continued conversation with family and the medical providers regarding overall plan of care and treatment options, ensuring decisions are within the context of the patient's values and GOCs. Questions and concerns were addressed.  The family was encouraged to call with questions or concerns.  PMT will continue to support holistically.  Conference with attending, bedside nursing staff, transition of care team related to patient condition, needs, goals of care, disposition.   HCPOA  NEXT OF KIN    SUMMARY OF RECOMMENDATIONS   At this point continue to treat the treatable but no CPR or intubation Home with the benefits of [facility] hospice services, currently connected with palliative    Code Status/Advance Care Planning: DNR - We again today talk about the concept of "treat the treatable, but allow a natural passing".  We talk about the concept of do not rehospitalize especially in light of the fact that Lisa Klein prefers to die in her own home.  Initially, Lisa Klein seems to defer to what her son, Lisa Klein, would want.  Kelly gently reminds her that this is her decision.  We talked about the realities of her chronic and acute illness, what we can and cannot change.  At this point Lisa Klein and [name] are agreeable for "treat the treatable, but allowing natural passing"/DNR.  Orders adjusted  Symptom Management:  Per hospitalist, no additional needs at this time.  Palliative Prophylaxis:  Frequent Pain Assessment, Oral Care, and Turn Reposition  Additional Recommendations (Limitations, Scope, Preferences): Home with the benefits of "treat the treatable" hospice  care  Psycho-social/Spiritual:  Desire for further Chaplaincy support:no Additional Recommendations: Caregiving  Support/Resources and Education on Hospice  Prognosis:  < 6 weeks, or less would be anticipated based on acute leukemia with no further cancer workup, decreasing functional status.  Discharge Planning: Anticipate home with [facility] hospice      Primary Diagnoses: Present on Admission:  Acute metabolic encephalopathy  SIRS (systemic inflammatory response syndrome) (HCC)  Acute myeloid leukemia not having achieved remission (HCC)  DVT AND Pulm Embolism-H/o Lt Leg  DVT and PE -----diagnosed 10/19/2022  Essential hypertension  Pancytopenia (HCC)   I have reviewed the medical record, interviewed the patient and family, and examined the patient. The following aspects are pertinent.  Past Medical History:  Diagnosis Date   Cancer (HCC) 2012   LEFT BREAST   Clotting disorder (HCC) 05/2011   dvt, right leg  Degenerative disc disease    with nerve compression    DJD (degenerative joint disease)    Hypertension    Hypertension    Hypothyroidism    Hypothyroidism    Long term current use of anticoagulant therapy 12/07/2014   Pulmonary embolus    Obesity    Osteopenia 08/24/2014   Social History   Socioeconomic History   Marital status: Widowed    Spouse name: Not on file   Number of children: 2   Years of education: 31   Highest education level: 12th grade  Occupational History   Occupation: [employer] part time    Occupation: retired/disabled   Tobacco Use   Smoking status: Never   Smokeless tobacco: Never  Substance and Sexual Activity   Alcohol use: No   Drug use: No   Sexual activity: Not Currently    Birth control/protection: Post-menopausal  Other Topics Concern   Not on file  Social History Narrative   Reports starting to have a lot of trouble doing things for herself. Lives alone, tries to drive to places, but it is getting increasingly harder to do.     Social Determinants of Health   Financial Resource Strain: Low Risk  (07/17/2022)   Overall Financial Resource Strain (CARDIA)    Difficulty of Paying Living Expenses: Not very hard  Food Insecurity: No Food Insecurity (07/15/2023)   Hunger Vital Sign    Worried About Running Out of Food in the Last Year: Never true    Ran Out of Food in the Last Year: Never true  Transportation Needs: No Transportation Needs (07/15/2023)   PRAPARE - Administrator, Civil Service (Medical): No    Lack of Transportation (Non-Medical): No  Physical Activity: Insufficiently Active (07/17/2022)   Exercise Vital Sign    Days of Exercise per Week: 5 days    Minutes of Exercise per Session: 10 min  Stress: No Stress Concern Present (07/17/2022)   [employer] of Occupational Health - Occupational Stress Questionnaire    Feeling of Stress : Not at all  Social Connections: Socially Isolated (07/17/2022)   Social Connection and Isolation Panel [NHANES]    Frequency of Communication with Friends and Family: More than three times a week    Frequency of Social Gatherings with Friends and Family: Three times a week    Attends Religious Services: Never    Active Member of Clubs or Organizations: No    Attends Banker Meetings: Never    Marital Status: Widowed   Family History  Problem Relation Age of Onset   COPD Father    Lung disease Father    Hypertension Sister    Arthritis Mother    Hypertension Sister    Heart disease Son        stent   Anesthesia problems Neg Hx    Malignant hyperthermia Neg Hx    Hypotension Neg Hx    Pseudochol deficiency Neg Hx    Scheduled Meds:  apixaban  2.5 mg Oral BID   levothyroxine  50 mcg Oral Daily   mirtazapine  7.5 mg Oral QHS   pantoprazole  40 mg Oral Daily   Continuous Infusions:  sodium chloride 75 mL/hr at 07/15/23 2149   azithromycin     cefTRIAXone (ROCEPHIN)  IV     PRN Meds:.acetaminophen **OR** acetaminophen,  fluticasone, ondansetron **OR** ondansetron (ZOFRAN) IV, polyethylene glycol Medications Prior to Admission:  Prior to Admission medications   Medication Sig Start Date End Date  Taking? Authorizing Provider  acetaminophen (TYLENOL) 650 MG CR tablet Take 650 mg by mouth 2 (two) times daily.   Yes [provider]  apixaban (ELIQUIS) 2.5 MG TABS tablet Take 1 tablet (2.5 mg total) by mouth 2 (two) times daily. 07/02/23  Yes Shah, Pratik D, DO  Biotin w/ Vitamins C & E (HAIR/SKIN/NAILS PO) Take 1 tablet by mouth daily.   Yes [provider]  calcium-vitamin D (OSCAL WITH D) 500-200 MG-UNIT tablet Take 1 tablet by mouth 2 (two) times daily.   Yes [provider]  Cod Liver Oil 1000 MG CAPS Take 1 capsule by mouth daily.    Yes [provider]  denosumab (PROLIA) 60 MG/ML SOLN injection Inject 60 mg into the skin every 6 (six) months. Administer in upper arm, thigh, or abdomen   Yes [provider]  diclofenac Sodium (VOLTAREN ARTHRITIS PAIN) 1 % GEL Apply 2 g topically 4 (four) times daily. 07/02/23  Yes Shah, Pratik D, DO  fluticasone (FLONASE) 50 MCG/ACT nasal spray Place 2 sprays into both nostrils daily. Patient taking differently: Place 2 sprays into both nostrils daily as needed for allergies. 04/23/23  Yes Kerri Perches, MD  latanoprost (XALATAN) 0.005 % ophthalmic solution Place 1 drop into both eyes at bedtime. 07/20/21  Yes [provider]  levothyroxine (SYNTHROID) 50 MCG tablet Take 1 tablet (50 mcg total) by mouth daily. 06/18/23  Yes Kerri Perches, MD  lovastatin (MEVACOR) 20 MG tablet TAKE 1 TABLET AT BEDTIME 05/08/23  Yes Kerri Perches, MD  metoprolol tartrate (LOPRESSOR) 25 MG tablet Take 1 tablet (25 mg total) by mouth 2 (two) times daily. 07/02/23 08/01/23 Yes Shah, Pratik D, DO  mirtazapine (REMERON) 7.5 MG tablet Take 1 tablet (7.5 mg total) by mouth at bedtime. 07/12/23  Yes Kerri Perches, MD  pantoprazole  (PROTONIX) 20 MG tablet TAKE 1 TABLET EVERY DAY 11/30/22  Yes Kerri Perches, MD  potassium chloride (KLOR-CON M) 10 MEQ tablet TAKE 1 TABLET TWICE DAILY 12/12/22  Yes Kerri Perches, MD  vitamin E 1000 UNIT capsule Take 1,000 Units by mouth daily.   Yes [provider]   Allergies  Allergen Reactions   Listerine [Antiseptic Mouth Rinse] Other (See Comments)    Caused sore throat   Singulair [Montelukast Sodium] Other (See Comments)    Abdominal spasm    Review of Systems  Unable to perform ROS: Age    Physical Exam Vitals and nursing note reviewed.  Constitutional:      General: She is not in acute distress.    Appearance: She is ill-appearing.  HENT:     Mouth/Throat:     Mouth: Mucous membranes are dry.  Cardiovascular:     Rate and Rhythm: Normal rate.  Pulmonary:     Effort: Pulmonary effort is normal. No respiratory distress.  Neurological:     Mental Status: She is alert.     Vital Signs: BP (!) 148/86 (BP Location: Right Arm)   Pulse 95   Temp 98.1 F (36.7 C)   Resp 19   Ht 5\' 4"  (1.626 m)   Wt 77.1 kg   SpO2 97%   BMI 29.18 kg/m  Pain Scale: 0-10   Pain Score: 0-No pain   SpO2: SpO2: 97 % O2 Device:SpO2: 97 % O2 Flow Rate: .O2 Flow Rate (L/min): 2 L/min  IO: Intake/output summary:  Intake/Output Summary (Last 24 hours) at 07/16/2023 1191 Last data filed at 07/16/2023 0500 Gross per  24 hour  Intake 658.75 ml  Output 300 ml  Net 358.75 ml    LBM: Last BM Date : 07/15/23 Baseline Weight: Weight: 77.1 kg Most recent weight: Weight: 77.1 kg     Palliative Assessment/Data:     Time In: 1020    Time Out: 1135 Time Total: 75 minutes  Greater than 50%  of this time was spent counseling and coordinating care related to the above assessment and plan.  Signed by: Katheran Awe, NP   Please contact Palliative Medicine Team phone at [phone] for questions and concerns.  For individual provider: See Loretha Stapler

## 2023-07-16 NOTE — Assessment & Plan Note (Signed)
-   Continue LT4 

## 2023-07-16 NOTE — Progress Notes (Signed)
  Progress Note   Patient: Lisa Klein WUJ:[id number] DOB: January 25, 1930 DOA: 07/15/2023     1 DOS: the patient was seen and examined on 07/16/2023 at 9:10AM      Brief hospital course: 89 y.o> F with breast CA s/p chemo, HTN, chronic AF and VTE on chronic Eliquis, and recently admitted with fever and pancytopenia and found to have AML who presented due to [facility] measured fever.   Admitted last month with fever, pancytopenia, not responding to antibiotics.  Underwent BMB, found to have AML.  Seen by Oncology, who recommended hospice given age, poor functional status and poor tolerability of chemo.  Palliative Care consulted and patient discharged with [facility].  Since discharge, patient weak and tired, poor PO intake.  On day of admission, [facility] was evaluating patient, noted fever, and sent her to the ER.     Assessment and Plan: * Acute metabolic encephalopathy Improved to baseline  SIRS (systemic inflammatory response syndrome) (HCC) Fevering again, unclear cause.   - Continue empiric antibiotics - Follow blood cultures    Pancytopenia (HCC) Due to AML  DVT AND Pulm Embolism-H/o Lt Leg  DVT and PE -----diagnosed 10/19/2022 Platelets 80K - Trend PLTs - Continue Eliquis  Acute myeloid leukemia not having achieved remission (HCC) Underwent bone marrow biopsy during recent hospitalization, with diagnosis of acute leukemia.  Seen by oncology, recommended against treatment due to age, functional status. - Continue mirtazpapine  Essential hypertension BP normal - Hold metoprolol  Acquired hypothyroidism - Continue LT4          Subjective: Patient still feeling weak and tired.  Fever overnight and again this afternoon.  No appetite.  Generalized weakness.  No cough, chest pain, urinary symptoms.     Physical Exam: BP 134/71 (BP Location: Right Arm)   Pulse (!) 116   Temp (!) 102.9 F (39.4 C) (Axillary)   Resp (!) 24   Ht 5\' 4"  (1.626 m)   Wt 77.1 kg   SpO2  100%   BMI 29.18 kg/m   Adult female, lying in bed, weak and tired Tachycardic, irregular, no murmurs, no peripheral edema, no JVD Respiratory rate normal, lungs clear without rales or wheezes, overall diminished Abdomen soft, mild tenderness in the center, no right-sided tenderness, no right upper quadrant tenderness, no ascites or distention Attention normal, affect blunted, judgment and insight appear normal, mild psychomotor slowing, tired, generalized weakness but symmetric strength, speech fluent, oriented to person, place, time    Data Reviewed: Discussed with palliative care team CBC shows pancytopenia Basic metabolic panel shows stable creatinine, mild transaminitis Sodium 132 INR 2.5  Family Communication: Daughter and sister by phone    Disposition: Status is: Inpatient The patient was readmitted with fever in the setting of new diagnosis of acute myeloid leukemia.  Her prognosis is poor and has been quoted to the daughter and sister as weeks to months.  This may be infection, although this may also just be progression of her leukemia.  We will observe in the hospital the next 48 hours, to see if she is declining, and warrants residential hospice versus home with home hospice        Author: Alberteen Sam, MD 07/16/2023 4:30 PM  For on call review [email or web address].

## 2023-07-17 DIAGNOSIS — D61818 Other pancytopenia: Secondary | ICD-10-CM | POA: Diagnosis not present

## 2023-07-17 DIAGNOSIS — G9341 Metabolic encephalopathy: Secondary | ICD-10-CM | POA: Diagnosis not present

## 2023-07-17 DIAGNOSIS — E039 Hypothyroidism, unspecified: Secondary | ICD-10-CM | POA: Diagnosis not present

## 2023-07-17 DIAGNOSIS — Z515 Encounter for palliative care: Secondary | ICD-10-CM | POA: Diagnosis not present

## 2023-07-17 DIAGNOSIS — R6510 Systemic inflammatory response syndrome (SIRS) of non-infectious origin without acute organ dysfunction: Secondary | ICD-10-CM | POA: Diagnosis not present

## 2023-07-17 DIAGNOSIS — Z7189 Other specified counseling: Secondary | ICD-10-CM | POA: Diagnosis not present

## 2023-07-17 MED ORDER — ALPRAZOLAM 0.25 MG PO TABS
0.2500 mg | ORAL_TABLET | Freq: Three times a day (TID) | ORAL | Status: DC | PRN
  Administered 2023-07-17 – 2023-07-18 (×2): 0.5 mg via ORAL
  Filled 2023-07-17 (×2): qty 2

## 2023-07-17 MED ORDER — FUROSEMIDE 10 MG/ML IJ SOLN
40.0000 mg | Freq: Once | INTRAMUSCULAR | Status: AC
  Administered 2023-07-17: 40 mg via INTRAVENOUS
  Filled 2023-07-17: qty 4

## 2023-07-17 NOTE — Progress Notes (Signed)
   07/17/23 1536  Spiritual Encounters  Type of Visit Follow up  Care provided to: Patient  Referral source Chaplain team  Reason for visit Routine spiritual support  OnCall Visit No   Chaplain's Reason for Visit: Chaplain visited the Pt at the suggestion of the lead Chaplain, to provide support and presence. Chaplain time spent:  35 minutes Chaplain Interventions: Cultivated a relationship of care and support via reflective listening and compassionate presence. Explore spiritual, Emotional, and Relational needs and resources. Used open ended questions to facilitated life review and storytelling. Chaplain provided prayer when Pt asked for it. Chaplain Assessment: Pt Needs Spiritual: none identified Emotional: Frustration with constant fatigue and her illness Relational: none identified Intermediate hopes: returning home for the holidays. Ultimate hopes: [employer] Identified:  Pt has a sister dedicated to providing care, and sister is also communicating with rest of family to rally support Pt also possess strong [place] faith as her orienting system and is skilled in using her faith for courage and support in times of crisis.  (1- Could benefit from support; 2- Supported; 3-Strongly Supported) Spiritual: 3-Strongly Supported Emotional: 2- Supported Relational: 3-Strongly Supported Additional Assessment: Pt was pleased to see a Orthoptist, and though she did not remember me from a visit 3 weeks ago for ACD education, she quickly warmed up to me and easily shared her thoughts and feelings. Pt states that she is "a homebody" and has always appreciated the blessing of having a place to call home and being a home to her family.  Returning home for her seems to be about more than just not being in the hospital, but about being in her place of purpose. Pt's sister was also present for the majority of the visit and appears to be a very positive support person in Pt's  life. Chaplain provided prayer when Pt asked for it.   Patient Outcomes: Pt expressed peace in her situation Pt encouraged. Pt Verbally processed emotions  Chaplaincy Plan: Chaplain will continue to follow Pt while she is present in [facility]. Chaplain Raelene Bott, MDiv [email or web address]

## 2023-07-17 NOTE — Progress Notes (Signed)
Palliative:   Mrs. Lisa Klein is lying quietly in bed.  She appears acutely/chronically ill and somewhat frail.  She greets me, making and mostly keeping eye contact.  She is alert and oriented, able to make her needs known.  Her younger sister (73) Lisa Klein, is present at bedside.  We talk in detail about her acute health issues and the treatment plan.  We talk about plan for Lasix to help remove fluid and easier breathing.  We talk about neutropenic fevers and how to treat.  We talk about breathlessness and the use of as needed anxiety medicines.  Orders adjusted.  We also talk about Lisa Klein spirit and emotions surrounding her end-of-life diagnosis.  Overall, she is greatly supported with family and accepting.    Lisa Klein speaks of Lisa Klein returning to Lisa Klein where she has been since her last discharge.  She shares understanding that Lisa Klein will be transitioning from palliative to hospice care.  We talk in detail about the benefits of hospice care.  Call to daughter, Lisa Klein.  We talk about Lisa Klein acute health concerns and my visit with she and her sister this morning.  We talk about disposition, plan to return to Lisa Klein with the benefits of [facility] hospice.  All questions answered.  Conference with attending, bedside nursing staff, transition of care team related to patient condition, needs, goals of care, disposition.  Plan:   At this point continue to treat the treatable but no CPR or intubation.  Anticipate returning to sister Lisa Klein with the benefits of [facility] hospice.  55 minutes  Lillia Carmel, NP Palliative medicine team Team phone [phone]

## 2023-07-17 NOTE — Progress Notes (Addendum)
  Progress Note   Patient: Lisa Klein XBJ:[id number] DOB: 04-06-1930 DOA: 07/15/2023     2 DOS: the patient was seen and examined on 07/17/2023        Brief hospital course: 89 y.o> F with breast CA s/p chemo, HTN, chronic AF and VTE on chronic Eliquis, and recently admitted with fever and pancytopenia and found to have AML who presented due to fever again.        Assessment and Plan: * SIRS (systemic inflammatory response syndrome) (HCC) Still with persistent fever despite antibiotics.   No abdominal pain.  Chest showed no consolidation, just mild interstitial edema and trace effusions.  Urinalysis with 6-10 white cells, rare bacteria only, not consistent with urinary tract infection.  No improvement with Rocephin and azithromycin.  Suspect fever is from AML.  Blood cultures no growth to date. - Continue empiric Rocephin and azithromycin     Dyspnea Patient with hypoxia to 89% yesterday and dyspnea today.  CT chest with interstitial edema. - Trial IV Lasix   Pancytopenia (HCC) Due to AML  DVT AND Pulm Embolism-H/o Lt Leg  DVT and PE -----diagnosed 10/19/2022 Platelets stably low - Continue Eliquis  Acute myeloid leukemia not having achieved remission (HCC) Underwent bone marrow biopsy during recent hospitalization, with diagnosis of acute leukemia.  Seen by oncology, recommended against treatment due to age, functional status. - Continue mirtazapine  Essential hypertension BP normal - Hold metoprolol  Acquired hypothyroidism - Continue levothyroxine          Subjective: Very dyspneic.  Fevered again to 101.6.  No abdominal pain, no urinary symptoms.  No nursing concerns.  She is dyspneic ask for oxygen need we have been placed back on.     Physical Exam: BP (!) 101/56 (BP Location: Right Arm)   Pulse (!) 123   Temp 98.1 F (36.7 C) (Oral)   Resp (!) 23   Ht 5\' 4"  (1.626 m)   Wt 77.1 kg   SpO2 91%   BMI 29.18 kg/m   Elderly adult female, appears  weak and tired, partially edentulous, lying in bed, sleeping but arouses startled Tachycardic, regular, no murmurs, no peripheral edema Respiratory rate increased, lung sounds diminished bilaterally, coarse breath sounds with Rales in both bases Abdomen soft without tenderness palpation or guarding, no ascites or distention Attention normal, affect normal, judgment and insight appear normal   Data Reviewed: Discussed with palliative care  Family Communication: Spoke with daughter by phone    Disposition: Status is: Inpatient         Author: Alberteen Sam, MD 07/17/2023 3:30 PM  For on call review [email or web address].

## 2023-07-17 NOTE — TOC Progression Note (Signed)
Transition of Care [facility]) - Progression Note    Patient Details  Name: Lisa Klein MRN: [id number] Date of Birth: [birth year]  Transition of Care [facility]) CM/SW Contact  Leitha Bleak, RN Phone Number: 07/17/2023, 10:58 AM  Clinical Narrative:   CM spoke with Gertie Exon, they have spoken with the family, currently she has no home needs. MD wanting to try IV lasix today and discuss discharging with family tomorrow.   Expected Discharge Plan: Home w Hospice Care Barriers to Discharge: Continued Medical Work up  Expected Discharge Plan and Services In-house Referral: Clinical Social Work    Living arrangements for the past 2 months: Single Family Home                    Social Determinants of Health (SDOH) Interventions SDOH Screenings   Food Insecurity: No Food Insecurity (07/15/2023)  Housing: Patient Declined (07/15/2023)  Transportation Needs: No Transportation Needs (07/15/2023)  Utilities: Not At Risk (07/15/2023)  Alcohol Screen: Low Risk  (07/17/2022)  Depression (PHQ2-9): Medium Risk (04/23/2023)  Financial Resource Strain: Low Risk  (07/17/2022)  Physical Activity: Insufficiently Active (07/17/2022)  Social Connections: Socially Isolated (07/17/2022)  Stress: No Stress Concern Present (07/17/2022)  Tobacco Use: Low Risk  (07/16/2023)    Readmission Risk Interventions    06/25/2023    3:39 PM  Readmission Risk Prevention Plan  Transportation Screening Complete  PCP or Specialist Appt within 5-7 Days Not Complete  Home Care Screening Complete  Medication Review (RN CM) Complete

## 2023-07-17 NOTE — Telephone Encounter (Signed)
 Ordered thru [employer]

## 2023-07-17 NOTE — Progress Notes (Signed)
   07/17/23 0801  Assess: MEWS Score  Temp 98.8 F (37.1 C)  BP 131/85  MAP (mmHg) 100  Pulse Rate (!) 103  Resp (!) 22  SpO2 98 %  O2 Device Nasal Cannula  O2 Flow Rate (L/min) 2 L/min  Assess: MEWS Score  MEWS Temp 0  MEWS Systolic 0  MEWS Pulse 1  MEWS RR 1  MEWS LOC 0  MEWS Score 2  MEWS Score Color Yellow  Assess: if the MEWS score is Yellow or Red  Were vital signs accurate and taken at a resting state? Yes  Does the patient meet 2 or more of the SIRS criteria? Yes  Does the patient have a confirmed or suspected source of infection? Yes  MEWS guidelines implemented  Yes, yellow  Treat  MEWS Interventions Considered administering scheduled or prn medications/treatments as ordered  Take Vital Signs  Increase Vital Sign Frequency  Yellow: Q2hr x1, continue Q4hrs until patient remains green for 12hrs  Escalate  MEWS: Escalate Yellow: Discuss with charge nurse and consider notifying provider and/or RRT  Notify: Charge Nurse/RN  Name of Charge Nurse/RN Notified Janan Ridge, RN  Provider Notification  Provider Name/Title Dr. Maryfrances Bunnell  Date Provider Notified 07/17/23  Time Provider Notified 1016  Method of Notification Page (secure chat)  Notification Reason Other (Comment) (increased HR and respirations resulting in yellow mews)  Assess: SIRS CRITERIA  SIRS Temperature  0  SIRS Pulse 1  SIRS Respirations  1  SIRS WBC 0  SIRS Score Sum  2

## 2023-07-17 NOTE — Progress Notes (Signed)
   07/17/23 1754  Assess: MEWS Score  Temp 100 F (37.8 C)  BP 114/69  MAP (mmHg) 80  Pulse Rate (!) 112  Resp (!) 26  SpO2 95 %  O2 Device Nasal Cannula  O2 Flow Rate (L/min) 2 L/min  Assess: MEWS Score  MEWS Temp 0  MEWS Systolic 0  MEWS Pulse 2  MEWS RR 2  MEWS LOC 0  MEWS Score 4  MEWS Score Color Red  Assess: if the MEWS score is Yellow or Red  Were vital signs accurate and taken at a resting state? Yes  Does the patient meet 2 or more of the SIRS criteria? Yes  Does the patient have a confirmed or suspected source of infection? Yes  MEWS guidelines implemented  Yes, red  Treat  MEWS Interventions Considered administering scheduled or prn medications/treatments as ordered  Take Vital Signs  Increase Vital Sign Frequency  Red: Q1hr x2, continue Q4hrs until patient remains green for 12hrs  Escalate  MEWS: Escalate Red: Discuss with charge nurse and notify provider. Consider notifying RRT. If remains red for 2 hours consider need for higher level of care  Notify: Charge Nurse/RN  Name of Charge Nurse/RN Notified Chales Abrahams, RN  Provider Notification  Provider Name/Title Dr. Maryfrances Bunnell  Date Provider Notified 07/17/23  Time Provider Notified 1825  Method of Notification Page (secure chat)  Notification Reason  (increased HR and respirations resulting in red mews)  Provider response No new orders  Date of Provider Response 07/17/23  Time of Provider Response 1828  Assess: SIRS CRITERIA  SIRS Temperature  0  SIRS Pulse 1  SIRS Respirations  1  SIRS WBC 0  SIRS Score Sum  2

## 2023-07-17 NOTE — Progress Notes (Signed)
   07/17/23 1024  Assess: MEWS Score  Temp (!) 101.6 F (38.7 C)  BP (!) 137/91  MAP (mmHg) 102  Pulse Rate 100  Resp (!) 24  Level of Consciousness Alert  SpO2 95 %  O2 Device Nasal Cannula  O2 Flow Rate (L/min) 2 L/min  Assess: MEWS Score  MEWS Temp 2  MEWS Systolic 0  MEWS Pulse 0  MEWS RR 1  MEWS LOC 0  MEWS Score 3  MEWS Score Color Yellow  Assess: if the MEWS score is Yellow or Red  Were vital signs accurate and taken at a resting state? Yes  Does the patient meet 2 or more of the SIRS criteria? Yes  Does the patient have a confirmed or suspected source of infection? Yes  MEWS guidelines implemented  Yes, yellow  Treat  MEWS Interventions Considered administering scheduled or prn medications/treatments as ordered  Take Vital Signs  Increase Vital Sign Frequency  Yellow: Q2hr x1, continue Q4hrs until patient remains green for 12hrs  Escalate  MEWS: Escalate Yellow: Discuss with charge nurse and consider notifying provider and/or RRT  Notify: Charge Nurse/RN  Name of Charge Nurse/RN Notified Chales Abrahams, RN  Provider Notification  Provider Name/Title Dr. Maryfrances Bunnell  Date Provider Notified 07/17/23  Time Provider Notified 1031  Method of Notification Face-to-face  Notification Reason  (temperature and increased respirations)  Assess: SIRS CRITERIA  SIRS Temperature  1  SIRS Pulse 1  SIRS Respirations  1  SIRS WBC 0  SIRS Score Sum  3

## 2023-07-17 NOTE — Progress Notes (Signed)
   07/17/23 2255  Assess: MEWS Score  Temp 99.1 F (37.3 C)  BP 119/66  MAP (mmHg) 80  Pulse Rate 97  Resp (!) 28  SpO2 96 %  O2 Device Nasal Cannula  O2 Flow Rate (L/min) 2 L/min  Assess: MEWS Score  MEWS Temp 0  MEWS Systolic 0  MEWS Pulse 0  MEWS RR 2  MEWS LOC 0  MEWS Score 2  MEWS Score Color Yellow  Assess: if the MEWS score is Yellow or Red  Were vital signs accurate and taken at a resting state? Yes  Does the patient meet 2 or more of the SIRS criteria? Yes  Does the patient have a confirmed or suspected source of infection? Yes  MEWS guidelines implemented  No, previously yellow, continue vital signs every 4 hours  Notify: Charge Nurse/RN  Name of Charge Nurse/RN Notified Bree, RN  Assess: SIRS CRITERIA  SIRS Temperature  0  SIRS Pulse 1  SIRS Respirations  1  SIRS WBC 0  SIRS Score Sum  2

## 2023-07-17 NOTE — Plan of Care (Signed)
  Problem: Health Behavior/Discharge Planning: Goal: Ability to manage health-related needs will improve Outcome: Progressing   Problem: Education: Goal: Knowledge of General Education information will improve Description: Including pain rating scale, medication(s)/side effects and non-pharmacologic comfort measures Outcome: Progressing   

## 2023-07-17 NOTE — Progress Notes (Signed)
   07/17/23 1956  Assess: MEWS Score  Temp 98.6 F (37 C)  BP 133/74  MAP (mmHg) 92  Pulse Rate (!) 115  Resp (!) 28  SpO2 94 %  O2 Device Nasal Cannula  O2 Flow Rate (L/min) 2 L/min  Assess: MEWS Score  MEWS Temp 0  MEWS Systolic 0  MEWS Pulse 2  MEWS RR 2  MEWS LOC 0  MEWS Score 4  MEWS Score Color Red  Assess: if the MEWS score is Yellow or Red  Were vital signs accurate and taken at a resting state? Yes  Does the patient meet 2 or more of the SIRS criteria? Yes  Does the patient have a confirmed or suspected source of infection? Yes  MEWS guidelines implemented  Yes, red  Treat  MEWS Interventions Considered administering scheduled or prn medications/treatments as ordered  Take Vital Signs  Increase Vital Sign Frequency  Red: Q1hr x2, continue Q4hrs until patient remains green for 12hrs  Escalate  MEWS: Escalate Red: Discuss with charge nurse and notify provider. Consider notifying RRT. If remains red for 2 hours consider need for higher level of care  Notify: Charge Nurse/RN  Name of Charge Nurse/RN Notified Bree, RN  Assess: SIRS CRITERIA  SIRS Temperature  0  SIRS Pulse 1  SIRS Respirations  1  SIRS WBC 0  SIRS Score Sum  2

## 2023-07-18 DIAGNOSIS — R6510 Systemic inflammatory response syndrome (SIRS) of non-infectious origin without acute organ dysfunction: Secondary | ICD-10-CM | POA: Diagnosis not present

## 2023-07-18 DIAGNOSIS — D61818 Other pancytopenia: Secondary | ICD-10-CM | POA: Diagnosis not present

## 2023-07-18 DIAGNOSIS — E039 Hypothyroidism, unspecified: Secondary | ICD-10-CM | POA: Diagnosis not present

## 2023-07-18 DIAGNOSIS — G9341 Metabolic encephalopathy: Secondary | ICD-10-CM | POA: Diagnosis not present

## 2023-07-18 LAB — BASIC METABOLIC PANEL
Anion gap: 9 (ref 5–15)
BUN: 17 mg/dL (ref 8–23)
CO2: 20 mmol/L — ABNORMAL LOW (ref 22–32)
Calcium: 7.5 mg/dL — ABNORMAL LOW (ref 8.9–10.3)
Chloride: 108 mmol/L (ref 98–111)
Creatinine, Ser: 0.95 mg/dL (ref 0.44–1.00)
GFR, Estimated: 57 mL/min — ABNORMAL LOW (ref >60)
Glucose, Bld: 112 mg/dL — ABNORMAL HIGH (ref 70–99)
Potassium: 3.8 mmol/L (ref 3.5–5.1)
Sodium: 137 mmol/L (ref 135–145)

## 2023-07-18 MED ORDER — AMOXICILLIN-POT CLAVULANATE 875-125 MG PO TABS
1.0000 | ORAL_TABLET | Freq: Two times a day (BID) | ORAL | Status: DC
  Administered 2023-07-18 – 2023-07-19 (×2): 1 via ORAL
  Filled 2023-07-18 (×2): qty 1

## 2023-07-18 MED ORDER — AZITHROMYCIN 250 MG PO TABS
500.0000 mg | ORAL_TABLET | Freq: Every day | ORAL | Status: DC
  Administered 2023-07-18 – 2023-07-19 (×2): 500 mg via ORAL
  Filled 2023-07-18 (×2): qty 2

## 2023-07-18 NOTE — TOC Progression Note (Addendum)
Transition of Care [facility]) - Progression Note    Patient Details  Name: Lisa Klein MRN: [id number] Date of Birth: [birth year]  Transition of Care [facility]) CM/SW Contact  Elliot Gault, LCSW Phone Number: 07/18/2023, 9:50 AM  Clinical Narrative:     TOC following. MD indicating pt may be declining to the point where she would be eligible for residential hospice if family prefers.  Contacted [facility], [place], to request that their RN evaluate pt today to determine what care she is eligible for and then speak with family to coordinate plan. Rae Halsted states they will be here to see patient between 12-3.  TOC will follow up after Hospice RN visit.  1449: Hospice has evaluated pt and spoken with pt/family about dc plan. Pt/family request dc to pt's sister's home tomorrow. Hospice arranging for home O2 and family requesting dc tomorrow. Hospice prefers dc tomorrow as well as they do not have an RN to admit pt to hospice at home tonight.   Updated RN and MD. Plan for dc tomorrow. Will follow up in AM.  Expected Discharge Plan: Home w Hospice Care Barriers to Discharge: Continued Medical Work up  Expected Discharge Plan and Services In-house Referral: Clinical Social Work     Living arrangements for the past 2 months: Single Family Home                                       Social Determinants of Health (SDOH) Interventions SDOH Screenings   Food Insecurity: No Food Insecurity (07/15/2023)  Housing: Patient Declined (07/15/2023)  Transportation Needs: No Transportation Needs (07/15/2023)  Utilities: Not At Risk (07/15/2023)  Alcohol Screen: Low Risk  (07/17/2022)  Depression (PHQ2-9): Medium Risk (04/23/2023)  Financial Resource Strain: Low Risk  (07/17/2022)  Physical Activity: Insufficiently Active (07/17/2022)  Social Connections: Socially Isolated (07/17/2022)  Stress: No Stress Concern Present (07/17/2022)  Tobacco Use: Low Risk  (07/16/2023)    Readmission Risk  Interventions    06/25/2023    3:39 PM  Readmission Risk Prevention Plan  Transportation Screening Complete  PCP or Specialist Appt within 5-7 Days Not Complete  Home Care Screening Complete  Medication Review (RN CM) Complete

## 2023-07-18 NOTE — Progress Notes (Signed)
  Progress Note   Patient: Lisa Klein WUJ:[id number] DOB: November 18, 1929 DOA: 07/15/2023     3 DOS: the patient was seen and examined on 07/18/2023 at 8:13AM      Brief hospital course: 89 y.o> F with breast CA s/p chemo, HTN, chronic AF and VTE on chronic Eliquis, and recently admitted with fever and pancytopenia and found to have AML who presented due to [facility] measured fever.   Admitted last month with fever, pancytopenia, not responding to antibiotics.  Underwent BMB, found to have AML.  Seen by Oncology, who recommended hospice given age, poor functional status and poor tolerability of chemo.  Palliative Care consulted and patient discharged with [facility].  Since discharge, patient weak and tired, poor PO intake.  On day of admission, [name] was evaluating patient, noted fever, and sent her to the ER.     Assessment and Plan: *Acute metabolic encephalopathy Fever and tachypnea likely due to AML Acute myeloid leukemia not having achieved remission (HCC) Fever curve overall with some gradual improvement, that she remains weak, dyspneic, with minimal oral intake (15% of breakfast), and slightly encephalopathic despite antibiotics and Lasix.  I think that all of what we see is from her AML, and that this is deteriorating faster than was expected. - Continue empiric antibiotics for now - Consult hospice, appreciate cares - Monitor oral intake, and for setting of care (residential hospice versus home) to family and [facility]     Pancytopenia (HCC) Due to AML  DVT AND Pulm Embolism-H/o Lt Leg  DVT and PE -----diagnosed 10/19/2022 - Okay to continue Eliquis  Essential hypertension - Hold metoprolol  Acquired hypothyroidism - Continue levothyorinxe          Subjective: Patient is weak, she has poor appetite.  Her last fever was yesterday morning.  She has had no vomiting, she still feels out of breath, she is still very fatigued with exertion.     Physical Exam: BP 106/73    Pulse 95   Temp 97.8 F (36.6 C) (Oral)   Resp (!) 24   Ht 5\' 4"  (1.626 m)   Wt 77.1 kg   SpO2 99%   BMI 29.18 kg/m   Elderly adult female, lying in bed, tired, heavy related, sleepy Tachycardic, regular, no murmurs, no pitting edema, no JVD Respiratory rate fast, lung sounds diminished but no rales or wheezes appreciated Abdomen without grimace to palpation or guarding Attention diminished, psychomotor slowing noted, face symmetric, oriented to self, hospital, severe generalized weakness   Data Reviewed: Discussed with palliative care Cultures no growth Creatinine 0.95, potassium 3.8  Family Communication: Daughter Advertising account planner by phone    Disposition: Status is: Inpatient The patient returns with symptomatic acute myeloid leukemia.  She is deteriorating quickly, and family would like to take her home with hospice.  They are making arrangements and can support her at home tomorrow.        Author: Alberteen Sam, MD 07/18/2023 2:45 PM  For on call review [email or web address].

## 2023-07-18 NOTE — Progress Notes (Signed)
Palliative: Chart review completed.  Face-to-face discussion with bedside nursing staff related to patient condition, needs.  It seems that Mrs. Stuckman has experienced a decline and [facility] will come to evaluate her eligibility for residential hospice at [place] [facility].   [facility] evaluated Mrs. Cerreta and are not offering residential hospice.  They can provide at home hospice care with Mrs. Simones returning to her sister, Ethel's home.    Conference with attending, bedside nursing staff, transition of care team related to patient condition, needs, goals of care, disposition.  Plan: At this point, the plan is for Mrs. Elsenpeter to return to her sister, Warnell Bureau, home 12/13 with the benefit of [facility] hosp[facility] care. Prognosis: 2-6 weeks or less expected based on new dx of AML and decreasing functional status.   DNR/goldenrod form completed and placed on chart.  No charge   Lillia Carmel, NP Palliative medicine team Team phone [phone]

## 2023-07-18 NOTE — Plan of Care (Signed)
?  Problem: Coping: ?Goal: Level of anxiety will decrease ?Outcome: Progressing ?  ?Problem: Safety: ?Goal: Ability to remain free from injury will improve ?Outcome: Progressing ?  ?

## 2023-07-19 DIAGNOSIS — C9200 Acute myeloblastic leukemia, not having achieved remission: Secondary | ICD-10-CM | POA: Diagnosis not present

## 2023-07-19 DIAGNOSIS — I4891 Unspecified atrial fibrillation: Secondary | ICD-10-CM | POA: Diagnosis not present

## 2023-07-19 DIAGNOSIS — I10 Essential (primary) hypertension: Secondary | ICD-10-CM | POA: Diagnosis not present

## 2023-07-19 DIAGNOSIS — G9341 Metabolic encephalopathy: Secondary | ICD-10-CM | POA: Diagnosis not present

## 2023-07-19 LAB — GLUCOSE, CAPILLARY
Glucose-Capillary: 86 mg/dL (ref 70–99)
Glucose-Capillary: 153 mg/dL — ABNORMAL HIGH (ref 70–99)
Glucose-Capillary: 86 mg/dL (ref 70–99)

## 2023-07-19 MED ORDER — AMOXICILLIN-POT CLAVULANATE 875-125 MG PO TABS: 1.0000 | ORAL_TABLET | Freq: Two times a day (BID) | ORAL | 0 refills | Status: DC

## 2023-07-19 NOTE — Discharge Summary (Signed)
Physician Discharge Summary   Patient: Lisa Klein MRN: [id number] DOB: 07/14/1930  Admit date:     07/15/2023  Discharge date: 07/19/23  Discharge Physician: Onalee Hua Vyolet Sakuma   PCP: Kerri Perches, MD   Recommendations at discharge:  D/c home with [facility] hospice to follow with ultimate transition to full comfort measure    Hospital Course: 89 y.o> F with breast CA s/p chemo, HTN, chronic AF and VTE on chronic Eliquis, and recently admitted with fever and pancytopenia and found to have AML who presented due to [facility] measured fever.   The patient was recently admitted to the hospital from 06/23/2023 to 07/02/2023.  During that hospitalization, the patient was treated for pneumonia.  Unfortunately, the patient had persistent fever, pancytopenia, not responding to antibiotics.  Underwent BMBx, found to have AML.  Seen by Oncology, who recommended hospice given age, poor functional status and poor tolerability of chemo.  Palliative Care consulted and patient discharged with [facility].  Since discharge, patient weak and tired, poor PO intake.  On day of admission, [facility] was evaluating patient, noted fever, and sent her to the ER.  During this hospitalization, the patient was started on ceftriaxone and azithromycin.  She was continued on her other home medications.  Her fever curve subsequently improved.  She remained pancytopenic.  CT chest revealed a torturous descending aorta which likely accounts for the patient's retrocardiac opacity.  There was mild interstitial edema.  The patient was seen by palliative medicine.  Goals of care discussions were held.  Ultimately, the patient and family felt it was in the best interest to transition the patient's focus of care to focus on comfort.  The patient was evaluated by Gertie Exon for residential hospice.  She not felt to be a candidate.  After further discussion, the patient will be discharged home with hospice care.  TOC assisted with transition  home.  Assessment and Plan: *Acute metabolic encephalopathy Fever and tachypnea likely due to AML Acute myeloid leukemia not having achieved remission (HCC) Fever curve overall with some gradual improvement, that she remains weak, dyspneic, with minimal oral intake (15% of breakfast), and slightly encephalopathic despite antibiotics and Lasix.  I think that all of what we see is from her AML, and that this is deteriorating faster than was expected. - Continue empiric antibiotics for now - Consult hospice>>plan to d/c home with hospice to follow     Pancytopenia (HCC) Due to AML -counts overall stable - B12, folate, ferritin, iron TIBC serum copper and methylmalonic acid all appear replete. Uric acid normal at 3.2, LDH 449, hepatitis panel negative, HIV NR    DVT AND Pulm Embolism-H/o Lt Leg  DVT and PE -----diagnosed 10/19/2022 - Okay to continue Eliquis -remains hemodynamically stable   Essential hypertension - Hold metoprolol due to soft BPs   Acquired hypothyroidism - Continue levothyorinxe  Stage III(T1 cN2 aM0) left breast IDC: s/p modified radical mastectomy on 02/14/2011. s/p anastrozole April 2013 - April 2023   Goals of Care -Had goals of care discussion with the patient's daughter -Plan is to DC home with most of her current medications -Plan is to ultimately transition to full comfort shortly after discharge home after the entire family has had a chance to come see the patient and all parties are in agreement with full comfort measures       Consultants: palliative Procedures performed: none  Disposition: Home Diet recommendation:  Regular diet DISCHARGE MEDICATION: Allergies as of 07/19/2023  Reactions   Listerine [antiseptic Mouth Rinse] Other (See Comments)   Caused sore throat   Singulair [montelukast Sodium] Other (See Comments)   Abdominal spasm         Medication List     STOP taking these medications    denosumab 60 MG/ML Soln  injection Commonly known as: PROLIA   metoprolol tartrate 25 MG tablet Commonly known as: LOPRESSOR   potassium chloride 10 MEQ tablet Commonly known as: KLOR-CON M       TAKE these medications    acetaminophen 650 MG CR tablet Commonly known as: TYLENOL Take 650 mg by mouth 2 (two) times daily.   amoxicillin-clavulanate 875-125 MG tablet Commonly known as: AUGMENTIN Take 1 tablet by mouth every 12 (twelve) hours.   apixaban 2.5 MG Tabs tablet Commonly known as: ELIQUIS Take 1 tablet (2.5 mg total) by mouth 2 (two) times daily.   calcium-vitamin D 500-200 MG-UNIT tablet Commonly known as: OSCAL WITH D Take 1 tablet by mouth 2 (two) times daily.   Cod Liver Oil 1000 MG Caps Take 1 capsule by mouth daily.   diclofenac Sodium 1 % Gel Commonly known as: Voltaren Arthritis Pain Apply 2 g topically 4 (four) times daily.   fluticasone 50 MCG/ACT nasal spray Commonly known as: FLONASE Place 2 sprays into both nostrils daily. What changed:  when to take this reasons to take this   HAIR/SKIN/NAILS PO Take 1 tablet by mouth daily.   latanoprost 0.005 % ophthalmic solution Commonly known as: XALATAN Place 1 drop into both eyes at bedtime.   levothyroxine 50 MCG tablet Commonly known as: SYNTHROID Take 1 tablet (50 mcg total) by mouth daily.   lovastatin 20 MG tablet Commonly known as: MEVACOR TAKE 1 TABLET AT BEDTIME   mirtazapine 7.5 MG tablet Commonly known as: REMERON Take 1 tablet (7.5 mg total) by mouth at bedtime.   pantoprazole 20 MG tablet Commonly known as: PROTONIX TAKE 1 TABLET EVERY DAY   vitamin E 1000 UNIT capsule Take 1,000 Units by mouth daily.        Discharge Exam: Filed Weights   07/15/23 1401  Weight: 77.1 kg   HEENT:  /AT, No thrush, no icterus CV:  RRR, no rub, no S3, no S4 Lung:  bibasilar crackles.  No wheeze Abd:  soft/+BS, NT Ext:  No edema, no lymphangitis, no synovitis, no rash   Condition at discharge:  stable  The results of significant diagnostics from this hospitalization (including imaging, microbiology, ancillary and laboratory) are listed below for reference.   Imaging Studies: CT CHEST WO CONTRAST Result Date: 07/16/2023 CLINICAL DATA:  Retrocardiac capacity, fevers, weakness. Recent AML diagnosis. EXAM: CT CHEST WITHOUT CONTRAST TECHNIQUE: Multidetector CT imaging of the chest was performed following the standard protocol without IV contrast. RADIATION DOSE REDUCTION: This exam was performed according to the departmental dose-optimization program which includes automated exposure control, adjustment of the mA and/or kV according to patient size and/or use of iterative reconstruction technique. COMPARISON:  CTA chest dated 10/17/2012. FINDINGS: Cardiovascular: The heart is top-normal in size. No pericardial effusion. Hypodense blood pool relative to myocardium, suggesting anemia. No evidence of thoracic aortic aneurysm. Atherosclerotic calcifications of the aortic arch. Tortuous descending thoracic aorta, accounting for the retrocardiac opacity on chest radiograph. Mild mid distal to the LAD and left circumflex. Mediastinum/Nodes: No suspicious mediastinal lymphadenopathy. Visualized thyroid is unremarkable. Lungs/Pleura: Small right and trace left pleural effusions. Associated right lower lobe opacity, likely atelectasis. Mild ground-glass opacities in the lungs bilaterally,  favoring mild interstitial edema over multifocal infection. No suspicious pulmonary nodules. No pneumothorax. Upper Abdomen: Visualized upper abdomen is grossly unremarkable. Musculoskeletal: Degenerative changes of the visualized thoracolumbar spine. IMPRESSION: Tortuous descending thoracic aorta, accounting for the retrocardiac opacity on chest radiograph. Suspected mild interstitial edema. Small right and trace left pleural effusions. Aortic Atherosclerosis (ICD10-I70.0). Electronically Signed   By: Charline Bills M.D.    On: 07/16/2023 01:44   DG Chest 2 View Result Date: 07/15/2023 CLINICAL DATA:  Left-sided pneumonia. EXAM: CHEST - 2 VIEW COMPARISON:  Chest x-ray earlier 07/15/2023 FINDINGS: Underinflation with elevated right hemidiaphragm. Enlarged cardiopericardial silhouette with vascular congestion. On lateral view there are small pleural effusions bilaterally. No pneumothorax. Overlapping cardiac leads. Degenerative changes. Surgical clips in the left axillary region. Persistent retrocardiac density. Advanced degenerative changes of the spine. IMPRESSION: Persistent left retrocardiac density. This could be related to the tortuous aorta but underlying lesion is not excluded. There are bilateral pleural effusions identified in the lateral view. Please correlate for any history and if needed additional cross-sectional study as clinically appropriate. Electronically Signed   By: Karen Kays M.D.   On: 07/15/2023 18:31   DG Chest Port 1 View Result Date: 07/15/2023 CLINICAL DATA:  Sepsis, decreased appetite, previous pneumonia EXAM: PORTABLE CHEST 1 VIEW COMPARISON:  06/25/2023 FINDINGS: Single frontal view of the chest demonstrates an enlarged cardiac silhouette. There is increased density in the retrocardiac region along the medial costophrenic angle, which could reflect left lower lobe consolidation. No other airspace disease, effusion, or pneumothorax. No acute bony abnormalities. IMPRESSION: 1. Increased retrocardiac density in the left medial costophrenic angle, which could reflect lung consolidation or vascular shadow. Further evaluation with two-view chest x-ray may be useful when clinical situation permits. Electronically Signed   By: Sharlet Salina M.D.   On: 07/15/2023 15:49   ECHOCARDIOGRAM COMPLETE Result Date: 06/28/2023    ECHOCARDIOGRAM REPORT   Patient Name:   LYNDY POWLES Date of Exam: 06/28/2023 Medical Rec #:  [id number]        Height:       64.0 in Accession #:    [id number]       Weight:        179.2 lb Date of Birth:  06-07-1930       BSA:          1.867 m Patient Age:    89 years         BP:           134/67 mmHg Patient Gender: F                HR:           100 bpm. Exam Location:  Inpatient Procedure: 2D Echo, Cardiac Doppler and Color Doppler Indications:    Atrial fibrillation. Respiratory distress.  History:        Patient has prior history of Echocardiogram examinations, most                 recent 07/06/2021. History of cancer; Risk Factors:Hypertension                 and Dyslipidemia.  Sonographer:    Delcie Roch RDCS Referring Phys: [id number] IMPRESSIONS  1. Left ventricular ejection fraction, by estimation, is 35 to 40%. The left ventricle has moderately decreased function. Left ventricular endocardial border not optimally defined to evaluate regional wall motion. Left ventricular diastolic parameters were normal.  2. Right ventricular systolic function is mild  to moderately reduced. The right ventricular size is normal. There is mildly elevated pulmonary artery systolic pressure. The estimated right ventricular systolic pressure is 41.6 mmHg.  3. There is an elliptical echodensity measuring 1.2 x 0.85 cm in the left atrium. Noted on the SAX view. Differential: Thrombus, thickened interatrial septum, mass etc., Consider TEE for further evaluation.  4. Right atrial size was mildly dilated.  5. The mitral valve is abnormal. Mild mitral valve regurgitation. No evidence of mitral stenosis.  6. The tricuspid valve is abnormal. Tricuspid valve regurgitation is moderate.  7. The aortic valve is tricuspid. Aortic valve regurgitation is trivial. No aortic stenosis is present.  8. The inferior vena cava is dilated in size with >50% respiratory variability, suggesting right atrial pressure of 8 mmHg. Comparison(s): Changes from prior study are noted. LVEF worsened now and new echodensity is noted in the left atrium most likely thickened interatrial septum, r/o thrombus and mass. FINDINGS   Left Ventricle: Left ventricular ejection fraction, by estimation, is 35 to 40%. The left ventricle has moderately decreased function. Left ventricular endocardial border not optimally defined to evaluate regional wall motion. The left ventricular internal cavity size was normal in size. There is no left ventricular hypertrophy. Left ventricular diastolic parameters were normal. Right Ventricle: The right ventricular size is normal. No increase in right ventricular wall thickness. Right ventricular systolic function is mild to moderately reduced. There is mildly elevated pulmonary artery systolic pressure. The tricuspid regurgitant velocity is 2.90 m/s, and with an assumed right atrial pressure of 8 mmHg, the estimated right ventricular systolic pressure is 41.6 mmHg. Left Atrium: There is an elliptical echodensity measuring 1.2 x 0.85 cm in the left atrium. Noted on the SAX view. Differential: Thrombus, thickened interatrial septum, mass etc., Consider TEE for further evaluation. Left atrial size was normal in size. Right Atrium: Right atrial size was mildly dilated. Pericardium: There is no evidence of pericardial effusion. Mitral Valve: The mitral valve is abnormal. Mild to moderate mitral annular calcification. Mild mitral valve regurgitation. No evidence of mitral valve stenosis. Tricuspid Valve: The tricuspid valve is abnormal. Tricuspid valve regurgitation is moderate . No evidence of tricuspid stenosis. Aortic Valve: The aortic valve is tricuspid. Aortic valve regurgitation is trivial. No aortic stenosis is present. Pulmonic Valve: The pulmonic valve was normal in structure. Pulmonic valve regurgitation is mild. No evidence of pulmonic stenosis. Aorta: The aortic root is normal in size and structure. Venous: The inferior vena cava is dilated in size with greater than 50% respiratory variability, suggesting right atrial pressure of 8 mmHg. IAS/Shunts: No atrial level shunt detected by color flow Doppler.   LEFT VENTRICLE PLAX 2D LVIDd:         3.80 cm LVIDs:         3.10 cm LV PW:         1.00 cm LV IVS:        1.00 cm LVOT diam:     1.90 cm LVOT Area:     2.84 cm  RIGHT VENTRICLE            IVC RV Basal diam:  2.30 cm    IVC diam: 2.40 cm RV S prime:     8.59 cm/s TAPSE (M-mode): 1.0 cm LEFT ATRIUM             Index        RIGHT ATRIUM           Index LA diam:  4.20 cm 2.25 cm/m   RA Area:     14.30 cm LA Vol (A2C):   61.0 ml 32.67 ml/m  RA Volume:   30.90 ml  16.55 ml/m LA Vol (A4C):   57.3 ml 30.69 ml/m LA Biplane Vol: 61.8 ml 33.10 ml/m   AORTA Ao Root diam: 2.60 cm MR Peak grad: 91.4 mmHg   TRICUSPID VALVE MR Mean grad: 61.0 mmHg   TR Peak grad:   33.6 mmHg MR Vmax:      478.00 cm/s TR Vmax:        290.00 cm/s MR Vmean:     370.0 cm/s                           SHUNTS                           Systemic Diam: 1.90 cm Vishnu Priya Mallipeddi Electronically signed by Winfield Rast Mallipeddi Signature Date/Time: 06/28/2023/4:39:47 PM    Final    NM Pulmonary Perfusion Result Date: 06/26/2023 CLINICAL DATA:  Pulmonary embolism (PE) suspected, low to intermediate prob, positive D-dimer EXAM: NUCLEAR MEDICINE PERFUSION LUNG SCAN TECHNIQUE: Perfusion images were obtained in multiple projections after intravenous injection of radiopharmaceutical. Ventilation scans intentionally deferred if perfusion scan and chest x-ray adequate for interpretation during COVID 19 epidemic. RADIOPHARMACEUTICALS:  4.4 mCi Tc-86m MAA IV COMPARISON:  Radiograph yesterday FINDINGS: Homogeneous distribution of radiotracer. No peripheral or wedge-shaped perfusion defects to suggest pulmonary embolus. IMPRESSION: No scintigraphic findings of pulmonary embolus. Electronically Signed   By: Narda Rutherford M.D.   On: 06/26/2023 16:56   DG Chest 2 View Result Date: 06/25/2023 CLINICAL DATA:  Dyspnea. EXAM: CHEST - 2 VIEW COMPARISON:  June 23, 2023. FINDINGS: The heart size and mediastinal contours are within normal limits. No  acute pulmonary abnormality seen. The visualized skeletal structures are unremarkable. IMPRESSION: No active cardiopulmonary disease. Electronically Signed   By: Lupita Raider M.D.   On: 06/25/2023 14:06   DG Chest Port 1 View Result Date: 06/23/2023 CLINICAL DATA:  Cough and fever. EXAM: PORTABLE CHEST 1 VIEW COMPARISON:  March 22, 2021 FINDINGS: Cardiomediastinal silhouette is normal. Mediastinal contours appear intact. There is no evidence of focal airspace consolidation, pleural effusion or pneumothorax. Osseous structures are without acute abnormality. Left chest wall postsurgical changes stable. IMPRESSION: No active disease. Electronically Signed   By: Ted Mcalpine M.D.   On: 06/23/2023 18:20    Microbiology: Results for orders placed or performed during the hospital encounter of 07/15/23  Culture, blood (Routine x 2)     Status: None (Preliminary result)   Collection Time: 07/15/23  2:15 PM   Specimen: BLOOD RIGHT FOREARM  Result Value Ref Range Status   Specimen Description BLOOD RIGHT FOREARM  Final   Special Requests   Final    BOTTLES DRAWN AEROBIC AND ANAEROBIC Blood Culture adequate volume   Culture   Final    NO GROWTH 4 DAYS Performed at [facility], [address] [place], [place] [address]    Report Status PENDING  Incomplete  Resp panel by RT-PCR (RSV, Flu A&B, Covid) Anterior Nasal Swab     Status: None   Collection Time: 07/15/23  2:21 PM   Specimen: Anterior Nasal Swab  Result Value Ref Range Status   SARS Coronavirus 2 by RT PCR NEGATIVE NEGATIVE Final    Comment: (NOTE) SARS-CoV-2 target nucleic acids are NOT  DETECTED.  The SARS-CoV-2 RNA is generally detectable in upper respiratory specimens during the acute phase of infection. The lowest concentration of SARS-CoV-2 viral copies this assay can detect is 138 copies/mL. A negative result does not preclude SARS-Cov-2 infection and should not be used as the sole basis for treatment or other patient  management decisions. A negative result may occur with  improper specimen collection/handling, submission of specimen other than nasopharyngeal swab, presence of viral mutation(s) within the areas targeted by this assay, and inadequate number of viral copies(<138 copies/mL). A negative result must be combined with clinical observations, patient history, and epidemiological information. The expected result is Negative.  Fact Sheet for Patients:  BloggerCourse.com  Fact Sheet for Healthcare Providers:  SeriousBroker.it  This test is no t yet approved or cleared by the [place] FDA and  has been authorized for detection and/or diagnosis of SARS-CoV-2 by FDA under an Emergency Use Authorization (EUA). This EUA will remain  in effect (meaning this test can be used) for the duration of the COVID-19 declaration under Section 564(b)(1) of the Act, 21 U.S.C.section 360bbb-3(b)(1), unless the authorization is terminated  or revoked sooner.       Influenza A by PCR NEGATIVE NEGATIVE Final   Influenza B by PCR NEGATIVE NEGATIVE Final    Comment: (NOTE) The Xpert Xpress SARS-CoV-2/FLU/RSV plus assay is intended as an aid in the diagnosis of influenza from Nasopharyngeal swab specimens and should not be used as a sole basis for treatment. Nasal washings and aspirates are unacceptable for Xpert Xpress SARS-CoV-2/FLU/RSV testing.  Fact Sheet for Patients: BloggerCourse.com  Fact Sheet for Healthcare Providers: SeriousBroker.it  This test is not yet approved or cleared by the [place] FDA and has been authorized for detection and/or diagnosis of SARS-CoV-2 by FDA under an Emergency Use Authorization (EUA). This EUA will remain in effect (meaning this test can be used) for the duration of the COVID-19 declaration under Section 564(b)(1) of the Act, 21 U.S.C. section 360bbb-3(b)(1),  unless the authorization is terminated or revoked.     Resp Syncytial Virus by PCR NEGATIVE NEGATIVE Final    Comment: (NOTE) Fact Sheet for Patients: BloggerCourse.com  Fact Sheet for Healthcare Providers: SeriousBroker.it  This test is not yet approved or cleared by the [place] FDA and has been authorized for detection and/or diagnosis of SARS-CoV-2 by FDA under an Emergency Use Authorization (EUA). This EUA will remain in effect (meaning this test can be used) for the duration of the COVID-19 declaration under Section 564(b)(1) of the Act, 21 U.S.C. section 360bbb-3(b)(1), unless the authorization is terminated or revoked.  Performed at [facility], [address] [place], [place] [address]   Culture, blood (Routine x 2)     Status: None (Preliminary result)   Collection Time: 07/15/23  2:23 PM   Specimen: BLOOD  Result Value Ref Range Status   Specimen Description BLOOD BLOOD RIGHT HAND  Final   Special Requests   Final    BOTTLES DRAWN AEROBIC AND ANAEROBIC Blood Culture adequate volume   Culture   Final    NO GROWTH 4 DAYS Performed at [facility], [address] [place], [place] [address]    Report Status PENDING  Incomplete    Labs: CBC: Recent Labs  Lab 07/15/23 1415  WBC 2.1*  NEUTROABS 1.2*  HGB 8.3*  HCT 24.3*  MCV 96.4  PLT 102*   Basic Metabolic Panel: Recent Labs  Lab 07/15/23 1415 07/18/23 0408  NA 132* 137  K  4.2 3.8  CL 102 108  CO2 19* 20*  GLUCOSE 120* 112*  BUN 17 17  CREATININE 1.22* 0.95  CALCIUM 8.2* 7.5*   Liver Function Tests: Recent Labs  Lab 07/15/23 1415  AST 60*  ALT 49*  ALKPHOS 96  BILITOT 1.3*  PROT 7.1  ALBUMIN 2.2*   CBG: Recent Labs  Lab 07/19/23 0726 07/19/23 1140  GLUCAP 86 153*    Discharge time spent: greater than 30 minutes.  Signed: Catarina Hartshorn, MD [facility] 07/19/2023

## 2023-07-19 NOTE — TOC Transition Note (Signed)
Transition of Care [facility]) - Discharge Note   Patient Details  Name: Lisa Klein MRN: [id number] Date of Birth: July 12, 1930  [facility] CM/SW Contact:  Elliot Gault, LCSW Phone Number: 07/19/2023, 11:36 AM   Clinical Narrative:     Pt stable to dc home with hospice today per MD. Confirmed with Hospice that the home O2 is in place. Spoke with pt's daughter to review dc plan. They remain in agreement with dc plan.   RN to arrange EMS once dc orders in and pt ready. Hospice will follow up with pt at home.  No other TOC needs.  Final next level of care: Home w Hospice Care Barriers to Discharge: Barriers Resolved   Patient Goals and CMS Choice Patient states their goals for this hospitalization and ongoing recovery are:: home with hospice CMS Medicare.gov Compare Post Acute Care list provided to:: Patient Represenative (must comment) Choice offered to / list presented to : Adult Children      Discharge Placement                       Discharge Plan and Services Additional resources added to the After Visit Summary for   In-house Referral: Clinical Social Work                                   Social Drivers of Health (SDOH) Interventions SDOH Screenings   Food Insecurity: No Food Insecurity (07/15/2023)  Housing: Patient Declined (07/15/2023)  Transportation Needs: No Transportation Needs (07/15/2023)  Utilities: Not At Risk (07/15/2023)  Alcohol Screen: Low Risk  (07/17/2022)  Depression (PHQ2-9): Medium Risk (04/23/2023)  Financial Resource Strain: Low Risk  (07/17/2022)  Physical Activity: Insufficiently Active (07/17/2022)  Social Connections: Socially Isolated (07/17/2022)  Stress: No Stress Concern Present (07/17/2022)  Tobacco Use: Low Risk  (07/16/2023)     Readmission Risk Interventions    06/25/2023    3:39 PM  Readmission Risk Prevention Plan  Transportation Screening Complete  PCP or Specialist Appt within 5-7 Days  Not Complete  Home Care Screening Complete  Medication Review (RN CM) Complete

## 2023-07-19 NOTE — Plan of Care (Signed)
?  Problem: Coping: ?Goal: Level of anxiety will decrease ?Outcome: Progressing ?  ?Problem: Safety: ?Goal: Ability to remain free from injury will improve ?Outcome: Progressing ?  ?

## 2023-07-20 LAB — CULTURE, BLOOD (ROUTINE X 2)
Culture: NO GROWTH
Culture: NO GROWTH
Special Requests: ADEQUATE
Special Requests: ADEQUATE

## 2023-07-30 ENCOUNTER — Inpatient Hospital Stay: Payer: Medicare HMO

## 2023-07-30 ENCOUNTER — Inpatient Hospital Stay: Payer: Medicare HMO | Admitting: Oncology

## 2023-08-06 ENCOUNTER — Encounter: Payer: Self-pay | Admitting: Family Medicine

## 2023-08-06 DIAGNOSIS — Z09 Encounter for follow-up examination after completed treatment for conditions other than malignant neoplasm: Secondary | ICD-10-CM | POA: Insufficient documentation

## 2023-08-06 DIAGNOSIS — G4700 Insomnia, unspecified: Secondary | ICD-10-CM | POA: Insufficient documentation

## 2023-08-06 NOTE — Assessment & Plan Note (Signed)
Poor mpobility, w/chair appropriate for improved function

## 2023-08-06 NOTE — Assessment & Plan Note (Signed)
Progressed, needs supplemental oxygen alkso inc oral intake, new disease burden of leukemia contributing also

## 2023-08-06 NOTE — Assessment & Plan Note (Signed)
Start low dose remeron

## 2023-08-06 NOTE — Assessment & Plan Note (Signed)
Patient in for follow up of recent hospitalization. Discharge summary, and laboratory and radiology data are reviewed, and any questions or concerns  are discussed. Specific issues requiring follow up are specifically addressed.  

## 2023-08-06 NOTE — Assessment & Plan Note (Signed)
Chronic eliquis

## 2023-08-08 ENCOUNTER — Ambulatory Visit: Payer: Medicare HMO | Admitting: Family Medicine

## 2023-08-21 ENCOUNTER — Encounter: Payer: Self-pay | Admitting: Family Medicine

## 2023-08-26 ENCOUNTER — Ambulatory Visit (HOSPITAL_COMMUNITY): Payer: Medicare HMO

## 2023-08-29 ENCOUNTER — Inpatient Hospital Stay: Payer: Medicare HMO

## 2023-08-29 ENCOUNTER — Ambulatory Visit (HOSPITAL_COMMUNITY): Payer: Medicare HMO

## 2023-09-05 ENCOUNTER — Inpatient Hospital Stay: Payer: Medicare HMO | Admitting: Oncology

## 2023-09-05 ENCOUNTER — Inpatient Hospital Stay: Payer: Medicare HMO

## 2023-09-06 ENCOUNTER — Ambulatory Visit: Payer: Medicare HMO | Admitting: Family Medicine

## 2023-10-05 DEATH — deceased
